# Patient Record
Sex: Male | Born: 1970
Health system: Southern US, Community
[De-identification: ages and names within clinical notes are randomized; demographics above are authoritative.]

## PROBLEM LIST (undated history)

## (undated) DIAGNOSIS — J449 Chronic obstructive pulmonary disease, unspecified: Secondary | ICD-10-CM

## (undated) DIAGNOSIS — R011 Cardiac murmur, unspecified: Secondary | ICD-10-CM

## (undated) DIAGNOSIS — R0600 Dyspnea, unspecified: Secondary | ICD-10-CM

## (undated) DIAGNOSIS — D649 Anemia, unspecified: Secondary | ICD-10-CM

## (undated) DIAGNOSIS — I10 Essential (primary) hypertension: Secondary | ICD-10-CM

## (undated) DIAGNOSIS — K219 Gastro-esophageal reflux disease without esophagitis: Secondary | ICD-10-CM

## (undated) DIAGNOSIS — C801 Malignant (primary) neoplasm, unspecified: Secondary | ICD-10-CM

## (undated) DIAGNOSIS — J45909 Unspecified asthma, uncomplicated: Secondary | ICD-10-CM

## (undated) HISTORY — DX: Chronic obstructive pulmonary disease, unspecified: J44.9

## (undated) HISTORY — DX: Essential (primary) hypertension: I10

---

## 2020-11-15 ENCOUNTER — Emergency Department (HOSPITAL_COMMUNITY): Payer: Self-pay

## 2020-11-15 ENCOUNTER — Inpatient Hospital Stay (HOSPITAL_COMMUNITY)
Admission: EM | Admit: 2020-11-15 | Discharge: 2020-12-15 | DRG: 853 | Disposition: A | Discharge status: Home / Self Care | Payer: Self-pay | Attending: Internal Medicine | Admitting: Internal Medicine

## 2020-11-15 ENCOUNTER — Other Ambulatory Visit: Payer: Self-pay

## 2020-11-15 ENCOUNTER — Encounter (HOSPITAL_COMMUNITY): Payer: Self-pay | Admitting: Emergency Medicine

## 2020-11-15 DIAGNOSIS — C19 Malignant neoplasm of rectosigmoid junction: Secondary | ICD-10-CM | POA: Diagnosis present

## 2020-11-15 DIAGNOSIS — Z8719 Personal history of other diseases of the digestive system: Secondary | ICD-10-CM

## 2020-11-15 DIAGNOSIS — E871 Hypo-osmolality and hyponatremia: Secondary | ICD-10-CM

## 2020-11-15 DIAGNOSIS — A419 Sepsis, unspecified organism: Principal | ICD-10-CM | POA: Diagnosis present

## 2020-11-15 DIAGNOSIS — Z6820 Body mass index (BMI) 20.0-20.9, adult: Secondary | ICD-10-CM

## 2020-11-15 DIAGNOSIS — F17210 Nicotine dependence, cigarettes, uncomplicated: Secondary | ICD-10-CM | POA: Diagnosis present

## 2020-11-15 DIAGNOSIS — D72825 Bandemia: Secondary | ICD-10-CM

## 2020-11-15 DIAGNOSIS — R739 Hyperglycemia, unspecified: Secondary | ICD-10-CM | POA: Diagnosis present

## 2020-11-15 DIAGNOSIS — U071 COVID-19: Secondary | ICD-10-CM

## 2020-11-15 DIAGNOSIS — R5383 Other fatigue: Secondary | ICD-10-CM

## 2020-11-15 DIAGNOSIS — K56600 Partial intestinal obstruction, unspecified as to cause: Secondary | ICD-10-CM | POA: Diagnosis present

## 2020-11-15 DIAGNOSIS — E8809 Other disorders of plasma-protein metabolism, not elsewhere classified: Secondary | ICD-10-CM | POA: Diagnosis present

## 2020-11-15 DIAGNOSIS — E440 Moderate protein-calorie malnutrition: Secondary | ICD-10-CM | POA: Insufficient documentation

## 2020-11-15 DIAGNOSIS — Z95828 Presence of other vascular implants and grafts: Secondary | ICD-10-CM

## 2020-11-15 DIAGNOSIS — K922 Gastrointestinal hemorrhage, unspecified: Secondary | ICD-10-CM | POA: Diagnosis present

## 2020-11-15 DIAGNOSIS — D75839 Thrombocytosis, unspecified: Secondary | ICD-10-CM

## 2020-11-15 DIAGNOSIS — C787 Secondary malignant neoplasm of liver and intrahepatic bile duct: Secondary | ICD-10-CM | POA: Diagnosis present

## 2020-11-15 DIAGNOSIS — R112 Nausea with vomiting, unspecified: Secondary | ICD-10-CM

## 2020-11-15 DIAGNOSIS — R160 Hepatomegaly, not elsewhere classified: Secondary | ICD-10-CM

## 2020-11-15 DIAGNOSIS — E43 Unspecified severe protein-calorie malnutrition: Secondary | ICD-10-CM

## 2020-11-15 DIAGNOSIS — K631 Perforation of intestine (nontraumatic): Secondary | ICD-10-CM

## 2020-11-15 DIAGNOSIS — Z800 Family history of malignant neoplasm of digestive organs: Secondary | ICD-10-CM

## 2020-11-15 DIAGNOSIS — Z4659 Encounter for fitting and adjustment of other gastrointestinal appliance and device: Secondary | ICD-10-CM

## 2020-11-15 DIAGNOSIS — K6819 Other retroperitoneal abscess: Secondary | ICD-10-CM

## 2020-11-15 DIAGNOSIS — K3532 Acute appendicitis with perforation and localized peritonitis, without abscess: Secondary | ICD-10-CM

## 2020-11-15 DIAGNOSIS — D500 Iron deficiency anemia secondary to blood loss (chronic): Secondary | ICD-10-CM | POA: Diagnosis present

## 2020-11-15 DIAGNOSIS — K567 Ileus, unspecified: Secondary | ICD-10-CM | POA: Diagnosis present

## 2020-11-15 DIAGNOSIS — D62 Acute posthemorrhagic anemia: Secondary | ICD-10-CM | POA: Diagnosis present

## 2020-11-15 DIAGNOSIS — D649 Anemia, unspecified: Secondary | ICD-10-CM

## 2020-11-15 DIAGNOSIS — Z0189 Encounter for other specified special examinations: Secondary | ICD-10-CM

## 2020-11-15 DIAGNOSIS — L0291 Cutaneous abscess, unspecified: Secondary | ICD-10-CM

## 2020-11-15 DIAGNOSIS — K6812 Psoas muscle abscess: Secondary | ICD-10-CM | POA: Diagnosis present

## 2020-11-15 DIAGNOSIS — R1031 Right lower quadrant pain: Secondary | ICD-10-CM

## 2020-11-15 DIAGNOSIS — Z720 Tobacco use: Secondary | ICD-10-CM

## 2020-11-15 DIAGNOSIS — T85698A Other mechanical complication of other specified internal prosthetic devices, implants and grafts, initial encounter: Secondary | ICD-10-CM

## 2020-11-15 DIAGNOSIS — K6389 Other specified diseases of intestine: Secondary | ICD-10-CM | POA: Diagnosis present

## 2020-11-15 DIAGNOSIS — Z9989 Dependence on other enabling machines and devices: Secondary | ICD-10-CM

## 2020-11-15 DIAGNOSIS — R1903 Right lower quadrant abdominal swelling, mass and lump: Secondary | ICD-10-CM

## 2020-11-15 DIAGNOSIS — C189 Malignant neoplasm of colon, unspecified: Secondary | ICD-10-CM

## 2020-11-15 HISTORY — DX: COVID-19: U07.1

## 2020-11-15 LAB — CBC WITH DIFFERENTIAL/PLATELET
Abs Immature Granulocytes: 0 10*3/uL (ref 0.00–0.07)
Basophils Absolute: 0 10*3/uL (ref 0.0–0.1)
Basophils Relative: 0 %
Eosinophils Absolute: 0 10*3/uL (ref 0.0–0.5)
Eosinophils Relative: 0 %
HCT: 19.2 % — ABNORMAL LOW (ref 39.0–52.0)
Hemoglobin: 5.4 g/dL — CL (ref 13.0–17.0)
Lymphocytes Relative: 4 %
Lymphs Abs: 0.9 10*3/uL (ref 0.7–4.0)
MCH: 17.5 pg — ABNORMAL LOW (ref 26.0–34.0)
MCHC: 28.1 g/dL — ABNORMAL LOW (ref 30.0–36.0)
MCV: 62.3 fL — ABNORMAL LOW (ref 80.0–100.0)
Monocytes Absolute: 0.2 10*3/uL (ref 0.1–1.0)
Monocytes Relative: 1 %
Neutro Abs: 20.3 10*3/uL — ABNORMAL HIGH (ref 1.7–7.7)
Neutrophils Relative %: 95 %
Platelets: 1165 10*3/uL (ref 150–400)
RBC: 3.08 MIL/uL — ABNORMAL LOW (ref 4.22–5.81)
RDW: 18.5 % — ABNORMAL HIGH (ref 11.5–15.5)
WBC: 21.4 10*3/uL — ABNORMAL HIGH (ref 4.0–10.5)
nRBC: 0 % (ref 0.0–0.2)
nRBC: 0 /100 WBC

## 2020-11-15 LAB — COMPREHENSIVE METABOLIC PANEL
ALT: 13 U/L (ref 0–44)
AST: 14 U/L — ABNORMAL LOW (ref 15–41)
Albumin: 1.9 g/dL — ABNORMAL LOW (ref 3.5–5.0)
Alkaline Phosphatase: 93 U/L (ref 38–126)
Anion gap: 10 (ref 5–15)
BUN: 13 mg/dL (ref 6–20)
CO2: 20 mmol/L — ABNORMAL LOW (ref 22–32)
Calcium: 8.2 mg/dL — ABNORMAL LOW (ref 8.9–10.3)
Chloride: 99 mmol/L (ref 98–111)
Creatinine, Ser: 0.79 mg/dL (ref 0.61–1.24)
GFR, Estimated: >60 mL/min (ref >60)
Glucose, Bld: 115 mg/dL — ABNORMAL HIGH (ref 70–99)
Potassium: 4.7 mmol/L (ref 3.5–5.1)
Sodium: 129 mmol/L — ABNORMAL LOW (ref 135–145)
Total Bilirubin: 0.2 mg/dL — ABNORMAL LOW (ref 0.3–1.2)
Total Protein: 6.6 g/dL (ref 6.5–8.1)

## 2020-11-15 LAB — VITAMIN B12: Vitamin B-12: 189 pg/mL (ref 180–914)

## 2020-11-15 LAB — POC OCCULT BLOOD, ED: Fecal Occult Bld: NEGATIVE

## 2020-11-15 LAB — FIBRINOGEN: Fibrinogen: 771 mg/dL — ABNORMAL HIGH (ref 210–475)

## 2020-11-15 LAB — TRIGLYCERIDES: Triglycerides: 91 mg/dL (ref <150)

## 2020-11-15 LAB — PREPARE RBC (CROSSMATCH)

## 2020-11-15 LAB — LACTATE DEHYDROGENASE: LDH: 98 U/L (ref 98–192)

## 2020-11-15 LAB — IRON AND TIBC
Iron: 8 ug/dL — ABNORMAL LOW (ref 45–182)
Saturation Ratios: 4 % — ABNORMAL LOW (ref 17.9–39.5)
TIBC: 228 ug/dL — ABNORMAL LOW (ref 250–450)
UIBC: 220 ug/dL

## 2020-11-15 LAB — RETICULOCYTES
Immature Retic Fract: 21.1 % — ABNORMAL HIGH (ref 2.3–15.9)
RBC.: 2.77 MIL/uL — ABNORMAL LOW (ref 4.22–5.81)
Retic Count, Absolute: 50.7 10*3/uL (ref 19.0–186.0)
Retic Ct Pct: 1.8 % (ref 0.4–3.1)

## 2020-11-15 LAB — PROTIME-INR
INR: 1.2 (ref 0.8–1.2)
Prothrombin Time: 14.5 seconds (ref 11.4–15.2)

## 2020-11-15 LAB — ABO/RH: ABO/RH(D): A POS

## 2020-11-15 LAB — D-DIMER, QUANTITATIVE: D-Dimer, Quant: 3.04 ug/mL-FEU — ABNORMAL HIGH (ref 0.00–0.50)

## 2020-11-15 LAB — FERRITIN: Ferritin: 64 ng/mL (ref 24–336)

## 2020-11-15 LAB — FOLATE: Folate: 9.1 ng/mL (ref >5.9)

## 2020-11-15 LAB — LACTIC ACID, PLASMA: Lactic Acid, Venous: 1.5 mmol/L (ref 0.5–1.9)

## 2020-11-15 LAB — SARS CORONAVIRUS 2 BY RT PCR (HOSPITAL ORDER, PERFORMED IN ~~LOC~~ HOSPITAL LAB): SARS Coronavirus 2: POSITIVE — AB

## 2020-11-15 LAB — PROCALCITONIN: Procalcitonin: 71.1 ng/mL

## 2020-11-15 LAB — C-REACTIVE PROTEIN: CRP: 21.5 mg/dL — ABNORMAL HIGH (ref <1.0)

## 2020-11-15 MED ORDER — IOHEXOL 350 MG/ML SOLN
80.0000 mL | Freq: Once | INTRAVENOUS | Status: AC | PRN
  Administered 2020-11-15: 80 mL via INTRAVENOUS

## 2020-11-15 MED ORDER — ONDANSETRON HCL 4 MG PO TABS
4.0000 mg | ORAL_TABLET | Freq: Four times a day (QID) | ORAL | Status: DC | PRN
  Administered 2020-11-25: 4 mg via ORAL
  Filled 2020-11-15: qty 1

## 2020-11-15 MED ORDER — SODIUM CHLORIDE 0.9 % IV SOLN
100.0000 mg | Freq: Every day | INTRAVENOUS | Status: AC
  Administered 2020-11-16 – 2020-11-17 (×2): 100 mg via INTRAVENOUS
  Filled 2020-11-15 (×2): qty 20

## 2020-11-15 MED ORDER — LACTATED RINGERS IV SOLN: INTRAVENOUS | Status: DC

## 2020-11-15 MED ORDER — SODIUM CHLORIDE 0.9 % IV SOLN
10.0000 mL/h | Freq: Once | INTRAVENOUS | Status: AC
  Administered 2020-11-15: 10 mL/h via INTRAVENOUS

## 2020-11-15 MED ORDER — FENTANYL CITRATE (PF) 100 MCG/2ML IJ SOLN: 50.0000 ug | INTRAMUSCULAR | Status: DC | PRN

## 2020-11-15 MED ORDER — ONDANSETRON HCL 4 MG/2ML IJ SOLN
4.0000 mg | Freq: Once | INTRAMUSCULAR | Status: AC
  Administered 2020-11-15: 4 mg via INTRAVENOUS
  Filled 2020-11-15: qty 2

## 2020-11-15 MED ORDER — LACTATED RINGERS IV BOLUS (SEPSIS)
500.0000 mL | Freq: Once | INTRAVENOUS | Status: AC
  Administered 2020-11-15: 500 mL via INTRAVENOUS

## 2020-11-15 MED ORDER — NICOTINE 21 MG/24HR TD PT24
21.0000 mg | MEDICATED_PATCH | Freq: Every day | TRANSDERMAL | Status: DC
  Administered 2020-11-16 – 2020-12-15 (×29): 21 mg via TRANSDERMAL
  Filled 2020-11-15 (×29): qty 1

## 2020-11-15 MED ORDER — PIPERACILLIN-TAZOBACTAM 3.375 G IVPB
3.3750 g | Freq: Three times a day (TID) | INTRAVENOUS | Status: AC
  Administered 2020-11-16 – 2020-12-03 (×54): 3.375 g via INTRAVENOUS
  Filled 2020-11-15 (×60): qty 50

## 2020-11-15 MED ORDER — SODIUM CHLORIDE 0.9 % IV SOLN
200.0000 mg | Freq: Once | INTRAVENOUS | Status: AC
  Administered 2020-11-15: 200 mg via INTRAVENOUS
  Filled 2020-11-15: qty 200

## 2020-11-15 MED ORDER — FENTANYL CITRATE (PF) 100 MCG/2ML IJ SOLN
50.0000 ug | Freq: Once | INTRAMUSCULAR | Status: AC
  Administered 2020-11-15: 50 ug via INTRAVENOUS
  Filled 2020-11-15: qty 2

## 2020-11-15 MED ORDER — SODIUM CHLORIDE 0.9 % IV BOLUS
500.0000 mL | Freq: Once | INTRAVENOUS | Status: AC
  Administered 2020-11-15: 500 mL via INTRAVENOUS

## 2020-11-15 MED ORDER — ONDANSETRON HCL 4 MG/2ML IJ SOLN
4.0000 mg | Freq: Four times a day (QID) | INTRAMUSCULAR | Status: DC | PRN
  Administered 2020-11-24 – 2020-11-30 (×5): 4 mg via INTRAVENOUS
  Filled 2020-11-15 (×5): qty 2

## 2020-11-15 MED ORDER — PIPERACILLIN-TAZOBACTAM 3.375 G IVPB 30 MIN
3.3750 g | Freq: Once | INTRAVENOUS | Status: AC
  Administered 2020-11-15: 3.375 g via INTRAVENOUS
  Filled 2020-11-15: qty 50

## 2020-11-15 MED ORDER — MORPHINE SULFATE (PF) 2 MG/ML IV SOLN
1.0000 mg | INTRAVENOUS | Status: DC | PRN
  Administered 2020-11-15 – 2020-11-18 (×16): 1 mg via INTRAVENOUS
  Filled 2020-11-15 (×16): qty 1

## 2020-11-15 MED ORDER — ACETAMINOPHEN 325 MG PO TABS
650.0000 mg | ORAL_TABLET | Freq: Once | ORAL | Status: AC
  Administered 2020-11-15: 650 mg via ORAL
  Filled 2020-11-15: qty 2

## 2020-11-15 MED ORDER — LACTATED RINGERS IV SOLN: INTRAVENOUS | Status: AC

## 2020-11-15 NOTE — ED Provider Notes (Incomplete)
[facility] EMERGENCY DEPARTMENT Provider Note   CSN: [id number] Arrival date & time: 11/15/20  1150     History Chief Complaint  Patient presents with  . Groin Pain    Eric [name] is a 50 y.o. male.  HPI     History reviewed. No pertinent past medical history.  There are no problems to display for this patient.   History reviewed. No pertinent surgical history.     No family history on file.  Social History   Tobacco Use  . Smoking status: Current Every Day Smoker  . Smokeless tobacco: Never Used  Substance Use Topics  . Alcohol use: Not Currently  . Drug use: Yes    Types: Marijuana    Home Medications Prior to Admission medications   Not on File    Allergies    Patient has no known allergies.  Review of Systems   Review of Systems  Physical Exam Updated Vital Signs BP 126/81 (BP Location: Right Arm)   Pulse (!) 127   Temp (!) 100.7 F (38.2 C) (Oral)   Resp (!) 24   SpO2 100%   Physical Exam  ED Results / Procedures / Treatments   Labs (all labs ordered are listed, but only abnormal results are displayed) Labs Reviewed  COMPREHENSIVE METABOLIC PANEL - Abnormal; Notable for the following components:      Result Value   Sodium 129 (*)    CO2 20 (*)    Glucose, Bld 115 (*)    Calcium 8.2 (*)    Albumin 1.9 (*)    AST 14 (*)    Total Bilirubin 0.2 (*)    All other components within normal limits  CBC WITH DIFFERENTIAL/PLATELET - Abnormal; Notable for the following components:   WBC 21.4 (*)    RBC 3.08 (*)    Hemoglobin 5.4 (*)    HCT 19.2 (*)    MCV 62.3 (*)    MCH 17.5 (*)    MCHC 28.1 (*)    RDW 18.5 (*)    All other components within normal limits  SARS CORONAVIRUS 2 BY RT PCR (HOSPITAL ORDER, [facility])  LACTIC ACID, PLASMA  LACTIC ACID, PLASMA  URINALYSIS, ROUTINE W REFLEX MICROSCOPIC  POC OCCULT BLOOD, ED  TYPE AND SCREEN    EKG EKG Interpretation  Date/Time:  Friday  November 15 2020 12:41:28 EST Ventricular Rate:  128 PR Interval:  116 QRS Duration: 72 QT Interval:  280 QTC Calculation: 408 R Axis:   57 Text Interpretation: *** Poor data quality, interpretation may be adversely affected Sinus tachycardia Otherwise normal ECG Confirmed by Dene Gentry [phone]) on 11/15/2020 1:29:19 PM   Radiology DG Chest Portable 1 View  Result Date: 11/15/2020 CLINICAL DATA:  COVID EXAM: PORTABLE CHEST 1 VIEW COMPARISON:  None. FINDINGS: Evaluation is limited secondary to patient rotation. The cardiomediastinal silhouette is normal in contour. Blunting of the RIGHT costophrenic angle. No pneumothorax. No acute pleuroparenchymal abnormality. Visualized abdomen is unremarkable. No acute osseous abnormality noted. IMPRESSION: Blunting of the RIGHT costophrenic angle may reflect a small pleural effusion. Electronically Signed   By: Valentino Saxon MD   On: 11/15/2020 13:27    Procedures Procedures {Remember to document critical care time when appropriate:1}  Medications Ordered in ED Medications  acetaminophen (TYLENOL) tablet 650 mg (650 mg Oral Given 11/15/20 1248)    ED Course  I have reviewed the triage vital signs and the nursing notes.  Pertinent labs &  imaging results that were available during my care of the patient were reviewed by me and considered in my medical decision making (see chart for details).    MDM Rules/Calculators/A&P                          *** Final Clinical Impression(s) / ED Diagnoses Final diagnoses:  None    Rx / DC Orders ED Discharge Orders    None

## 2020-11-15 NOTE — ED Provider Notes (Signed)
[facility] EMERGENCY DEPARTMENT Provider Note   CSN: [id number] Arrival date & time: 11/15/20  1150     History Chief Complaint  Patient presents with  . Groin Pain    Eric [name] is a 50 y.o. male.  Eric [name] is a 50 y.o. male with unknown past medical history, saw a new doctor for the first time in many years today for evaluation of right groin and abdominal pain.  He was sent to the emergency department for further evaluation.  Patient reports that on Monday he started noticing pain in his right lower abdomen and groin that sometimes radiates down into his leg with some tingling.  No weakness, but pain has made walking difficult and he has been using a cane.  Patient also reports that he has been feeling a bit weak and fatigued.  His PCP was concerned given this pain and felt that he needed to come to the emergency department.  Patient reports they did check his stool for blood in the office today and reports that this was negative.  Patient has not seen any bright red blood, or black stools.  Denies any dysuria or urinary frequency.  Has not seen any blood in his urine.  Was also noted to be febrile at the doctor's office, unknown if he has been having fevers at home.  Reports he has been feeling a bit unwell, denies chest pain or shortness of breath but has had an occasional cough.  He has had 2 doses of COVID vaccine, has not had a booster.  Reports some coworkers at work have had a COVID recently.  Denies alcohol use.  Does report current smoking.        History reviewed. No pertinent past medical history.  There are no problems to display for this patient.   History reviewed. No pertinent surgical history.     No family history on file.  Social History   Tobacco Use  . Smoking status: Current Every Day Smoker  . Smokeless tobacco: Never Used  Substance Use Topics  . Alcohol use: Not Currently  . Drug use: Yes    Types: Marijuana    Home  Medications Prior to Admission medications   Not on File    Allergies    Patient has no known allergies.  Review of Systems   Review of Systems  Constitutional: Positive for chills and fever.  HENT: Negative for congestion, rhinorrhea and sore throat.   Respiratory: Positive for cough. Negative for shortness of breath.   Cardiovascular: Negative for chest pain and leg swelling.  Gastrointestinal: Positive for abdominal pain. Negative for blood in stool, constipation, diarrhea, nausea and vomiting.  Genitourinary: Negative for dysuria, flank pain, frequency, hematuria, penile swelling, scrotal swelling and testicular pain.  Musculoskeletal: Positive for myalgias. Negative for arthralgias and back pain.  Skin: Negative for color change and rash.  Neurological: Negative for dizziness, syncope and light-headedness.  All other systems reviewed and are negative.   Physical Exam Updated Vital Signs BP 113/75   Pulse 93   Temp (!) 100.7 F (38.2 C) (Oral)   Resp 19   SpO2 98%   Physical Exam Vitals and nursing note reviewed.  Constitutional:      General: He is not in acute distress.    Appearance: He is well-developed and well-nourished. He is ill-appearing. He is not diaphoretic.     Comments: Alert, appears much older than stated age, pale and ill-appearing but in no acute  distress  HENT:     Head: Normocephalic and atraumatic.     Mouth/Throat:     Mouth: Oropharynx is clear and moist. Mucous membranes are dry.     Pharynx: Oropharynx is clear.  Eyes:     General:        Right eye: No discharge.        Left eye: No discharge.     Extraocular Movements: EOM normal.     Comments: Conjunctivae pale  Cardiovascular:     Rate and Rhythm: Regular rhythm. Tachycardia present.     Pulses: Intact distal pulses.     Heart sounds: Normal heart sounds. No murmur heard. No friction rub. No gallop.   Pulmonary:     Effort: Pulmonary effort is normal. No respiratory distress.      Breath sounds: Normal breath sounds. No wheezing or rales.     Comments: Respirations equal and unlabored, patient able to speak in full sentences, lungs clear to auscultation bilaterally  Abdominal:     General: Bowel sounds are normal. There is no distension.     Palpations: Abdomen is soft. There is no mass.     Tenderness: There is abdominal tenderness. There is no guarding.     Comments: Abdomen is soft, nondistended, bowel sounds present throughout, there is focal tenderness noted in the right lower quadrant with slight guarding, all other quadrants nontender to palpation.  Genitourinary:    Comments: Patient testicles appear normal, no masses or swelling noted, no evidence of hernia. Patient had negative Hemoccult at PCP today, so repeat rectal exam deferred Musculoskeletal:        General: Tenderness present. No deformity or edema.     Cervical back: Neck supple.     Comments: Some tenderness noted in the right upper thigh, primarily with movement, no palpable swelling or deformity no overlying skin changes.  Skin:    General: Skin is warm and dry.     Capillary Refill: Capillary refill takes less than 2 seconds.  Neurological:     Mental Status: He is alert.     Coordination: Coordination normal.     Comments: Speech is clear, able to follow commands CN III-XII intact Normal strength in upper and lower extremities bilaterally including dorsiflexion and plantar flexion, strong and equal grip strength Sensation normal to light and sharp touch Moves extremities without ataxia, coordination intact  Psychiatric:        Mood and Affect: Mood normal.        Behavior: Behavior normal.     ED Results / Procedures / Treatments   Labs (all labs ordered are listed, but only abnormal results are displayed) Labs Reviewed  SARS CORONAVIRUS 2 BY RT PCR (HOSPITAL ORDER, [facility]) - Abnormal; Notable for the following components:      Result Value   SARS  Coronavirus 2 POSITIVE (*)    All other components within normal limits  COMPREHENSIVE METABOLIC PANEL - Abnormal; Notable for the following components:   Sodium 129 (*)    CO2 20 (*)    Glucose, Bld 115 (*)    Calcium 8.2 (*)    Albumin 1.9 (*)    AST 14 (*)    Total Bilirubin 0.2 (*)    All other components within normal limits  CBC WITH DIFFERENTIAL/PLATELET - Abnormal; Notable for the following components:   WBC 21.4 (*)    RBC 3.08 (*)    Hemoglobin 5.4 (*)    HCT  19.2 (*)    MCV 62.3 (*)    MCH 17.5 (*)    MCHC 28.1 (*)    RDW 18.5 (*)    Platelets 1,165 (*)    Neutro Abs 20.3 (*)    All other components within normal limits  D-DIMER, QUANTITATIVE (NOT AT [facility]) - Abnormal; Notable for the following components:   D-Dimer, Quant 3.04 (*)    All other components within normal limits  FIBRINOGEN - Abnormal; Notable for the following components:   Fibrinogen 771 (*)    All other components within normal limits  C-REACTIVE PROTEIN - Abnormal; Notable for the following components:   CRP 21.5 (*)    All other components within normal limits  CULTURE, BLOOD (ROUTINE X 2)  CULTURE, BLOOD (ROUTINE X 2)  LACTIC ACID, PLASMA  LACTATE DEHYDROGENASE  FERRITIN  TRIGLYCERIDES  URINALYSIS, ROUTINE W REFLEX MICROSCOPIC  PATHOLOGIST SMEAR REVIEW  PROCALCITONIN  VITAMIN B12  FOLATE  IRON AND TIBC  RETICULOCYTES  PROTIME-INR  POC OCCULT BLOOD, ED  TYPE AND SCREEN  PREPARE RBC (CROSSMATCH)  ABO/RH    EKG EKG Interpretation  Date/Time:  Friday November 15 2020 12:41:28 EST Ventricular Rate:  128 PR Interval:  116 QRS Duration: 72 QT Interval:  280 QTC Calculation: 408 R Axis:   57 Text Interpretation:  Poor data quality, interpretation may be adversely affected Sinus tachycardia Otherwise normal ECG Confirmed by Dene Gentry [phone]) on 11/15/2020 1:29:19 PM   Radiology DG Chest Portable 1 View  Result Date: 11/15/2020 CLINICAL DATA:  COVID EXAM: PORTABLE CHEST 1 VIEW  COMPARISON:  None. FINDINGS: Evaluation is limited secondary to patient rotation. The cardiomediastinal silhouette is normal in contour. Blunting of the RIGHT costophrenic angle. No pneumothorax. No acute pleuroparenchymal abnormality. Visualized abdomen is unremarkable. No acute osseous abnormality noted. IMPRESSION: Blunting of the RIGHT costophrenic angle may reflect a small pleural effusion. Electronically Signed   By: Valentino Saxon MD   On: 11/15/2020 13:27    Procedures .Critical Care Performed by: Jacqlyn Larsen, PA-C Authorized by: Jacqlyn Larsen, PA-C   Critical care provider statement:    Critical care time (minutes):  45   Critical care was necessary to treat or prevent imminent or life-threatening deterioration of the following conditions:  Circulatory failure (Anemia requiring transfusion)   Critical care was time spent personally by me on the following activities:  Discussions with consultants, evaluation of patient's response to treatment, examination of patient, ordering and performing treatments and interventions, ordering and review of laboratory studies, ordering and review of radiographic studies, pulse oximetry, re-evaluation of patient's condition, obtaining history from patient or surrogate, review of old charts and development of treatment plan with patient or surrogate     Medications Ordered in ED Medications  0.9 %  sodium chloride infusion (has no administration in time range)  acetaminophen (TYLENOL) tablet 650 mg (650 mg Oral Given 11/15/20 1248)  fentaNYL (SUBLIMAZE) injection 50 mcg (50 mcg Intravenous Given 11/15/20 1423)  ondansetron (ZOFRAN) injection 4 mg (4 mg Intravenous Given 11/15/20 1424)  sodium chloride 0.9 % bolus 500 mL (500 mLs Intravenous New Bag/Given 11/15/20 1425)    ED Course  I have reviewed the triage vital signs and the nursing notes.  Pertinent labs & imaging results that were available during my care of the patient were reviewed by me  and considered in my medical decision making (see chart for details).    MDM Rules/Calculators/A&P  50 year old male arrives of right lower quadrant and right groin pain, but found to have hemoglobin of 5.4 on lab work completed in triage as well as leukocytosis of 21.4, thrombocytosis, and patient found to be febrile and tachycardic.  He is normotensive.  Patient appears pale and somewhat ill but it is alert and mentating well and not in acute distress.  He reports an occasional cough but no chest pain or shortness of breath.  Pain in the right lower quadrant and groin has been present for a week.  With movement pain radiates into his leg and is associated with tingling.  No vomiting, normal bowel movements.  While in the room evaluating the patient his Covid test also returned positive which could explain his cough and fever, but given pain and significant blood dyscrasias we will also get CT abdomen pelvis, will also check COVID inflammatory markers.  2 unit PRBC transfusion ordered and patient will certainly require admission for further evaluation of cause of this anemia as he has not had any bleeding symptoms and had negative Hemoccult at PCP office prior to arrival.  Additional history obtained from patient's sister who was initially at bedside prior to arrival.  No prior medical charts available for review.  I have independently ordered, reviewed and interpreted all labs and imaging: CBC: Leukocytosis of 21.4 with left shift and greater than 20% bands, microcytic anemia of 5.4 of unknown chronicity, significant thrombocytosis with platelets of 1165.  CBC findings concerning for potential myeloproliferative disorder. CMP: Hyponatremia of 129, CO2 of 20, glucose 115, normal renal function and liver function  Covid: Positive   Chest x-ray with some blunting of the right costophrenic angle that could be due to a small pleural effusion, but otherwise no other active  cardiopulmonary disease.  EKG with sinus tachycardia.  Patient is critically ill with symptomatic anemia of unknown etiology, 2 units PRBC transfusion ordered as well as IV fluids, pain and nausea medication.  Will get CT abdomen pelvis to further evaluate right lower quadrant pain radiating into the thigh.  Patient is currently neurologically intact in bilateral lower extremities.  D-dimer significantly elevated with COVID inflammatory markers will also check CTA abdomen pelvis. COVID respiratory disease is mild at this time.  At shift change care signed out to PA Wyn Quaker who will follow up on pending studies and admit patient.  Final Clinical Impression(s) / ED Diagnoses Final diagnoses:  Symptomatic anemia  Right lower quadrant pain    Rx / DC Orders ED Discharge Orders    None       Janet Berlin 11/15/20 [place], DO 11/18/20 [phone]

## 2020-11-15 NOTE — H&P (Signed)
History and Physical    Eric [name] EHM:[id number] DOB: 17-Nov-1970 DOA: 11/15/2020  PCP: [facility]  Patient coming from: Home  I have personally briefly reviewed patient's old medical records in [place]  Chief Complaint: Right groin pain  HPI: Eric [name] is a 50 y.o. male with medical history significant for tobacco use who presents to the ED for evaluation of right groin pain.  Patient states over the last 5 days he has had significant right groin pain which radiates down his right thigh.  Pain has been progressive since onset and constant.  He reports having chills and night sweats.  He has had about 15 pound unintentional weight loss over the last 3 months.  He denies any subjective fevers.  He denies any chest pain or dyspnea.  He has a new mild nonproductive cough.  He denies any nausea, vomiting, dysuria, diarrhea, or constipation.  He has not had any routine medical care for at least 10 years.  He is a chronic smoker of up to 1.5 packs/day since age of 67.  He reports rare alcohol use with last drink 3 months ago.  He uses marijuana on occasion and denies any cocaine or IV drug use.  He does not take any medications other than over-the-counter Tylenol as needed.  He reports a history of diabetes in his sister.  He works as a Games developer.  ED Course:  Initial vitals showed BP 126/81, pulse 127, RR 24, temp 100.7 F, SPO2 100% on room air.  Labs significant for WBC 21.4, hemoglobin 5.4, hematocrit 19.2, MCV 62.3, RDW 18.5, platelets 1165k, sodium 129, potassium 4.7, bicarb 20, BUN 13, creatinine 0.79, AST 14, ALT 13, alk phos 93, total bilirubin 0.2, D-dimer 3.04, procalcitonin 71.10, CRP 21.5, ferritin 64, iron 8, TIBC 228, saturation 4%, folate 9.1, B12 189.  FOBT negative.  Portable chest x-ray showed blunting of the right costophrenic angle.  CTA chest and CT abdomen/pelvis obtained which showed long segment masslike mural thickening of the cecum  concerning for neoplasm.  Posterior perforation at the site of cecal wall thickening seen with multilocular right retroperitoneal abscess which extends into the right psoas and right iliacus muscles.  Indeterminate hypodensity inferior right lobe of the liver seen.  No evidence of PE.  Mild wall thickening of the distal thoracic esophagus noted.  Patient was given 500 cc normal saline, 500 cc LR, IV Zofran, IV fentanyl, IV Zosyn.  General surgery were consulted and recommended medical admission.  No surgery planned at time of admission.  The hospitalist service was consulted to me for further evaluation and management.  Review of Systems: All systems reviewed and are negative except as documented in history of present illness above.   History reviewed. No pertinent past medical history.  History reviewed. No pertinent surgical history.  Social History:  reports that he has been smoking. He has never used smokeless tobacco. He reports previous alcohol use. He reports current drug use. Drug: Marijuana.  No Known Allergies  Family History  Problem Relation Age of Onset  . Diabetes Sister      Prior to Admission medications   Medication Sig Start Date End Date Taking? Authorizing Provider  acetaminophen (TYLENOL) 500 MG tablet Take 500 mg by mouth every 6 (six) hours as needed.   Yes [provider]  Calcium Carbonate Antacid (ALKA-SELTZER ANTACID PO) Take 1 tablet by mouth daily as needed (For pain).   Yes [provider]  famotidine (PEPCID)  20 MG tablet Take 20 mg by mouth 2 (two) times daily.   Yes [provider]    Physical Exam: Vitals:   11/15/20 1845 11/15/20 1900 11/15/20 1915 11/15/20 2000  BP: 120/79 115/76 121/79 118/80  Pulse:  95 95 96  Resp: 19 (!) 25 (!) 23 (!) 23  Temp:    99.1 F (37.3 C)  TempSrc:    Oral  SpO2:  100% 98% 100%  Weight:    69.4 kg  Height:    6' (1.829 m)   Constitutional: Resting supine in bed, NAD, calm,  comfortable Eyes: PERRL, lids and conjunctivae normal ENMT: Mucous membranes are moist. Posterior pharynx clear of any exudate or lesions.Normal dentition.  Neck: normal, supple, no masses. Respiratory: clear to auscultation bilaterally, no wheezing, no crackles. Normal respiratory effort. No accessory muscle use.  Cardiovascular: Regular rate and rhythm, no murmurs / rubs / gallops. No extremity edema. 2+ pedal pulses. Abdomen: RLQ tenderness. Bowel sounds positive.  Musculoskeletal: no clubbing / cyanosis. No joint deformity upper and lower extremities. Good ROM, no contractures. Normal muscle tone.  Skin: no rashes, lesions, ulcers. No induration Neurologic: CN 2-12 grossly intact. Sensation intact. Strength 5/5 in all 4.  Psychiatric: Normal judgment and insight. Alert and oriented x 3. Normal mood.   Labs on Admission: I have personally reviewed following labs and imaging studies  CBC: Recent Labs  Lab 11/15/20 1246  WBC 21.4*  NEUTROABS 20.3*  HGB 5.4*  HCT 19.2*  MCV 62.3*  PLT 2,671*   Basic Metabolic Panel: Recent Labs  Lab 11/15/20 1246  NA 129*  K 4.7  CL 99  CO2 20*  GLUCOSE 115*  BUN 13  CREATININE 0.79  CALCIUM 8.2*   GFR: Estimated Creatinine Clearance: 107.2 mL/min (by C-G formula based on SCr of 0.79 mg/dL). Liver Function Tests: Recent Labs  Lab 11/15/20 1246  AST 14*  ALT 13  ALKPHOS 93  BILITOT 0.2*  PROT 6.6  ALBUMIN 1.9*   No results for input(s): LIPASE, AMYLASE in the last 168 hours. No results for input(s): AMMONIA in the last 168 hours. Coagulation Profile: Recent Labs  Lab 11/15/20 1520  INR 1.2   Cardiac Enzymes: No results for input(s): CKTOTAL, CKMB, CKMBINDEX, TROPONINI in the last 168 hours. BNP (last 3 results) No results for input(s): PROBNP in the last 8760 hours. HbA1C: No results for input(s): HGBA1C in the last 72 hours. CBG: No results for input(s): GLUCAP in the last 168 hours. Lipid Profile: Recent Labs     11/15/20 1429  TRIG 91   Thyroid Function Tests: No results for input(s): TSH, T4TOTAL, FREET4, T3FREE, THYROIDAB in the last 72 hours. Anemia Panel: Recent Labs    11/15/20 1429 11/15/20 1520  VITAMINB12  --  189  FOLATE  --  9.1  FERRITIN 64  --   TIBC  --  228*  IRON  --  8*  RETICCTPCT  --  1.8   Urine analysis: No results found for: COLORURINE, APPEARANCEUR, LABSPEC, PHURINE, GLUCOSEU, HGBUR, BILIRUBINUR, KETONESUR, PROTEINUR, UROBILINOGEN, NITRITE, LEUKOCYTESUR  Radiological Exams on Admission: CT Angio Chest PE W and/or Wo Contrast  Result Date: 11/15/2020 CLINICAL DATA:  Positive D-dimer, COVID-19 positive, tobacco abuse, right lower quadrant abdominal pain EXAM: CT ANGIOGRAPHY CHEST CT ABDOMEN AND PELVIS WITH CONTRAST TECHNIQUE: Multidetector CT imaging of the chest was performed using the standard protocol during bolus administration of intravenous contrast. Multiplanar CT image reconstructions and MIPs were obtained to evaluate the vascular anatomy. Multidetector  CT imaging of the abdomen and pelvis was performed using the standard protocol during bolus administration of intravenous contrast. CONTRAST:  26m OMNIPAQUE IOHEXOL 350 MG/ML SOLN COMPARISON:  11/15/2020 FINDINGS: CTA CHEST FINDINGS Cardiovascular: This is a technically adequate evaluation of the pulmonary vasculature. No filling defects or pulmonary emboli. The heart is unremarkable without pericardial effusion. Mild atherosclerosis within the LAD distribution of the coronary vasculature. Normal caliber of the thoracic aorta without evidence of dissection. Mediastinum/Nodes: Mild wall thickening of the distal thoracic esophagus is nonspecific and could reflect esophagitis. Thyroid and trachea are unremarkable. No pathologic adenopathy. Lungs/Pleura: No acute airspace disease, effusion, or pneumothorax. Mild upper lobe predominant emphysema with areas of subpleural scarring. Central airways are patent. Musculoskeletal: No  acute or destructive bony lesions. Reconstructed images demonstrate no additional findings. Review of the MIP images confirms the above findings. CT ABDOMEN and PELVIS FINDINGS Hepatobiliary: There is an indeterminate hypodensity within the inferior aspect right lobe liver, measuring approximately 3.5 x 2.5 cm reference image 36/4. No other focal liver abnormalities. The gallbladder is unremarkable. No intrahepatic duct dilation. Pancreas: Unremarkable. No pancreatic ductal dilatation or surrounding inflammatory changes. Spleen: Normal in size without focal abnormality. Adrenals/Urinary Tract: Adrenal glands are unremarkable. Kidneys are normal, without renal calculi, focal lesion, or hydronephrosis. Bladder is unremarkable. Stomach/Bowel: There is masslike wall thickening involving the cecum, extending approximately 9 cm in length. There is evidence of cecal perforation posteriorly, with a complex multiloculated retroperitoneal fluid collection. There are intramuscular components of the fluid collection within the right psoas and iliacus muscles, with a fluid collection extending into the proximal aspect of the iliopsoas tendon. This collection measures approximately 9.1 x 6.7 cm in transverse dimension, and extends approximately 18 cm in craniocaudal length. Differential diagnosis includes neoplasm or severe colitis. A normal gas-filled appendix is seen in the right lower quadrant. Vascular/Lymphatic: No discrete adenopathy within the abdomen or pelvis. Moderate atherosclerosis of the aorta. Reproductive: Prostate is unremarkable. Other: There is no free intraperitoneal fluid or free gas. No abdominal wall hernia. Musculoskeletal: No acute or destructive bony lesions. Reconstructed images demonstrate no additional findings. Review of the MIP images confirms the above findings. IMPRESSION: 1. Long segment masslike mural thickening of the cecum, concerning for neoplasm. 2. Posterior perforation at the site of cecal  wall thickening, with multilocular right retroperitoneal abscess which extends into the right psoas and right iliacus muscles as above. 3. Indeterminate hypodensity inferior right lobe liver. Dedicated nonemergent outpatient liver MRI could be performed for definitive characterization. 4. No evidence of pulmonary embolus. 5. Mild wall thickening of the distal thoracic esophagus, which could reflect mild esophagitis. 6. Aortic Atherosclerosis (ICD10-I70.0) and Emphysema (ICD10-J43.9). Critical Value/emergent results were called by telephone at the time of interpretation on 11/15/2020 at 4:16 pm to provider [place] who verbally acknowledged these results. Electronically Signed   By: MRanda NgoM.D.   On: 11/15/2020 16:22   CT ABDOMEN PELVIS W CONTRAST  Result Date: 11/15/2020 CLINICAL DATA:  Positive D-dimer, COVID-19 positive, tobacco abuse, right lower quadrant abdominal pain EXAM: CT ANGIOGRAPHY CHEST CT ABDOMEN AND PELVIS WITH CONTRAST TECHNIQUE: Multidetector CT imaging of the chest was performed using the standard protocol during bolus administration of intravenous contrast. Multiplanar CT image reconstructions and MIPs were obtained to evaluate the vascular anatomy. Multidetector CT imaging of the abdomen and pelvis was performed using the standard protocol during bolus administration of intravenous contrast. CONTRAST:  825mOMNIPAQUE IOHEXOL 350 MG/ML SOLN COMPARISON:  11/15/2020 FINDINGS: CTA CHEST FINDINGS Cardiovascular: This  is a technically adequate evaluation of the pulmonary vasculature. No filling defects or pulmonary emboli. The heart is unremarkable without pericardial effusion. Mild atherosclerosis within the LAD distribution of the coronary vasculature. Normal caliber of the thoracic aorta without evidence of dissection. Mediastinum/Nodes: Mild wall thickening of the distal thoracic esophagus is nonspecific and could reflect esophagitis. Thyroid and trachea are unremarkable. No  pathologic adenopathy. Lungs/Pleura: No acute airspace disease, effusion, or pneumothorax. Mild upper lobe predominant emphysema with areas of subpleural scarring. Central airways are patent. Musculoskeletal: No acute or destructive bony lesions. Reconstructed images demonstrate no additional findings. Review of the MIP images confirms the above findings. CT ABDOMEN and PELVIS FINDINGS Hepatobiliary: There is an indeterminate hypodensity within the inferior aspect right lobe liver, measuring approximately 3.5 x 2.5 cm reference image 36/4. No other focal liver abnormalities. The gallbladder is unremarkable. No intrahepatic duct dilation. Pancreas: Unremarkable. No pancreatic ductal dilatation or surrounding inflammatory changes. Spleen: Normal in size without focal abnormality. Adrenals/Urinary Tract: Adrenal glands are unremarkable. Kidneys are normal, without renal calculi, focal lesion, or hydronephrosis. Bladder is unremarkable. Stomach/Bowel: There is masslike wall thickening involving the cecum, extending approximately 9 cm in length. There is evidence of cecal perforation posteriorly, with a complex multiloculated retroperitoneal fluid collection. There are intramuscular components of the fluid collection within the right psoas and iliacus muscles, with a fluid collection extending into the proximal aspect of the iliopsoas tendon. This collection measures approximately 9.1 x 6.7 cm in transverse dimension, and extends approximately 18 cm in craniocaudal length. Differential diagnosis includes neoplasm or severe colitis. A normal gas-filled appendix is seen in the right lower quadrant. Vascular/Lymphatic: No discrete adenopathy within the abdomen or pelvis. Moderate atherosclerosis of the aorta. Reproductive: Prostate is unremarkable. Other: There is no free intraperitoneal fluid or free gas. No abdominal wall hernia. Musculoskeletal: No acute or destructive bony lesions. Reconstructed images demonstrate no  additional findings. Review of the MIP images confirms the above findings. IMPRESSION: 1. Long segment masslike mural thickening of the cecum, concerning for neoplasm. 2. Posterior perforation at the site of cecal wall thickening, with multilocular right retroperitoneal abscess which extends into the right psoas and right iliacus muscles as above. 3. Indeterminate hypodensity inferior right lobe liver. Dedicated nonemergent outpatient liver MRI could be performed for definitive characterization. 4. No evidence of pulmonary embolus. 5. Mild wall thickening of the distal thoracic esophagus, which could reflect mild esophagitis. 6. Aortic Atherosclerosis (ICD10-I70.0) and Emphysema (ICD10-J43.9). Critical Value/emergent results were called by telephone at the time of interpretation on 11/15/2020 at 4:16 pm to provider [place], who verbally acknowledged these results. Electronically Signed   By: Randa Ngo M.D.   On: 11/15/2020 16:22   DG Chest Portable 1 View  Result Date: 11/15/2020 CLINICAL DATA:  COVID EXAM: PORTABLE CHEST 1 VIEW COMPARISON:  None. FINDINGS: Evaluation is limited secondary to patient rotation. The cardiomediastinal silhouette is normal in contour. Blunting of the RIGHT costophrenic angle. No pneumothorax. No acute pleuroparenchymal abnormality. Visualized abdomen is unremarkable. No acute osseous abnormality noted. IMPRESSION: Blunting of the RIGHT costophrenic angle may reflect a small pleural effusion. Electronically Signed   By: Valentino Saxon MD   On: 11/15/2020 13:27    EKG: Personally reviewed. Sinus tachycardia, rate 128, no acute ischemic changes.  No prior for comparison.  Assessment/Plan Principal Problem:   Sepsis ([place]) Active Problems:   Colonic mass   Iron deficiency anemia due to chronic blood loss   COVID-19 virus infection   Thrombocytosis   Retroperitoneal  abscess ([place])   Hyponatremia  Eric [name] is a 50 y.o. male with medical history significant for  tobacco use who is admitted with sepsis due to right retroperitoneal abscess.  Sepsis due to right retroperitoneal abscess associated with cecal mass and perforation: Present on arrival with leukocytosis, tachycardia, tachypnea, and fever. -General surgery following -Continue IV Zosyn per pharmacy -Follow blood and urine cultures -Continue IV fluid resuscitation overnight -IV morphine as needed for severe pain -Keep n.p.o.  Severe iron deficiency anemia: Hemoglobin 5.4 on admission.  Transfusing 2 unit PRBCs.  Suspect chronic blood loss from cecal mass.  B12 is also low. -Transfusing 2 unit PRBCs -Follow-up posttransfusion H&H  Thrombocytosis: Suspect reactive thrombocytosis due to anemia and ongoing infection.  Continue to monitor.  Hyponatremia: Mild.  Continue IV fluids and follow labs.  COVID-19 viral infection: SARS-CoV-2 PCR + 11/15/2020.  Has mild nonproductive cough otherwise no significant Covid symptoms.  He has received 2 doses of the Pfizer Covid vaccine but has not yet received the booster dose.  He had positive contacts at work.  CT of the lungs without evidence of airspace disease or PE. -Continue precautions -Start IV remdesivir x3-day course  Liver lesion: Indeterminate hypodensity inferior right lower lobe of the liver seen on CT imaging.  Consider nonemergent liver MRI for definitive characterization.  Tobacco use: Advised on smoking cessation.  Nicotine patch provided.  DVT prophylaxis: SCDs Code Status: Full code, confirmed with patient Family Communication: Discussed with patient, he has discussed with family Disposition Plan: From home, dispo pending further management of sepsis, retroperitoneal abscess Consults called: General surgery Level of care: Telemetry Surgical Admission status:  Status is: Inpatient  Remains inpatient appropriate because:IV treatments appropriate due to intensity of illness or inability to take PO and Inpatient level of care  appropriate due to severity of illness   Dispo: The patient is from: Home              Anticipated d/c is to: Home              Anticipated d/c date is: 3 days              Patient currently is not medically stable to d/c.   Difficult to place patient No   Zada Finders MD [facility]  If 7PM-7AM, please contact night-coverage [email or web address]  11/15/2020, 9:39 PM

## 2020-11-15 NOTE — Consult Note (Signed)
Reason for Consult: Perforated colon mass Referring Physician: Dr. Judithann Sheen is an 50 y.o. [name].  HPI: Patient is a Eric [name], who comes in secondary to right lower quadrant and hip pain.  Patient states he had not been to the doctor in the last 10 years.  Patient had pain and presented to an outside physician's office.  Patient was sent to the ER for evaluation.  Patient states that he had pain approximately starting Monday.  He states this was right lower quadrant and right hip pain.  He states that it has been continuing since that time.  He states prior to his had no issues.  Patient states he been having normal bowel movements without any blood, and not black.  Patient states the caliber has been normal.  Patient states he had a normal appetite.  Patient denies any previous colonoscopy or endoscopy.  Patient states that he has had no family history of any cancer.  CT scan that I reviewed personally reveals large right-sided colon mass, retroperitoneal perforation with abscess likely over the psoas and down into the right iliopsoas.  Patient also with iron deficiency anemia, hemoglobin 5.3  Patient also Covid +  History reviewed. No pertinent past medical history.  History reviewed. No pertinent surgical history.  No family history on file.  Social History:  reports that he has been smoking. He has never used smokeless tobacco. He reports previous alcohol use. He reports current drug use. Drug: Marijuana.  Allergies: No Known Allergies  Medications: I have reviewed the patient's current medications.  Results for orders placed or performed during the hospital encounter of 11/15/20 (from the past 48 hour(s))  SARS Coronavirus 2 by RT PCR (hospital order, performed in [facility] hospital lab) Nasopharyngeal Nasopharyngeal Swab     Status: Abnormal   Collection Time: 11/15/20 12:41 PM   Specimen: Nasopharyngeal Swab  Result Value Ref Range   SARS Coronavirus 2  POSITIVE (A) NEGATIVE    Comment: RESULT CALLED TO, READ BACK BY AND VERIFIED WITH: E SHAFFER RN 1352 11/15/20 A BROWNING (NOTE) SARS-CoV-2 target nucleic acids are DETECTED  SARS-CoV-2 RNA is generally detectable in upper respiratory specimens  during the acute phase of infection.  Positive results are indicative  of the presence of the identified virus, but do not rule out bacterial infection or co-infection with other pathogens not detected by the test.  Clinical correlation with patient history and  other diagnostic information is necessary to determine patient infection status.  The expected result is negative.  Fact Sheet for Patients:   BoilerBrush.com.cy   Fact Sheet for Healthcare Providers:   https://pope.com/    This test is not yet approved or cleared by the [place] FDA and  has been authorized for detection and/or diagnosis of SARS-CoV-2 by FDA under an Emergency Use Authorization (EUA).  This EUA will remain in effect (meaning this t est can be used) for the duration of  the COVID-19 declaration under Section 564(b)(1) of the Act, 21 U.S.C. section 360-bbb-3(b)(1), unless the authorization is terminated or revoked sooner.  Performed at [facility], [address], [place], [place] [address]   Lactic acid, plasma     Status: None   Collection Time: 11/15/20 12:46 PM  Result Value Ref Range   Lactic Acid, Venous 1.5 0.5 - 1.9 mmol/L    Comment: Performed at [facility], [address], [place], [place] [address]  Comprehensive metabolic panel  Status: Abnormal   Collection Time: 11/15/20 12:46 PM  Result Value Ref Range   Sodium 129 (L) 135 - 145 mmol/L   Potassium 4.7 3.5 - 5.1 mmol/L   Chloride 99 98 - 111 mmol/L   CO2 20 (L) 22 - 32 mmol/L   Glucose, Bld 115 (H) 70 - 99 mg/dL    Comment: Glucose reference range applies only to samples taken after fasting for at least 8 hours.   BUN 13 6 -  20 mg/dL   Creatinine, Ser 0.79 0.61 - 1.24 mg/dL   Calcium 8.2 (L) 8.9 - 10.3 mg/dL   Total Protein 6.6 6.5 - 8.1 g/dL   Albumin 1.9 (L) 3.5 - 5.0 g/dL   AST 14 (L) 15 - 41 U/L   ALT 13 0 - 44 U/L   Alkaline Phosphatase 93 38 - 126 U/L   Total Bilirubin 0.2 (L) 0.3 - 1.2 mg/dL   GFR, Estimated >60 >60 mL/min    Comment: (NOTE) Calculated using the CKD-EPI Creatinine Equation (2021)    Anion gap 10 5 - 15    Comment: Performed at [facility], [place] [address] [place], [place] [address]  CBC with Differential     Status: Abnormal   Collection Time: 11/15/20 12:46 PM  Result Value Ref Range   WBC 21.4 (H) 4.0 - 10.5 K/uL   RBC 3.08 (L) 4.22 - 5.81 MIL/uL   Hemoglobin 5.4 (LL) 13.0 - 17.0 g/dL    Comment: REPEATED TO VERIFY Reticulocyte Hemoglobin testing may be clinically indicated, consider ordering this additional test [id number] THIS CRITICAL RESULT HAS VERIFIED AND BEEN CALLED TO T [place] RN BY KIRSTENE FORSYTH ON 02 04 2022 AT 1319, AND HAS BEEN READ BACK.     HCT 19.2 (L) 39.0 - 52.0 %   MCV 62.3 (L) 80.0 - 100.0 fL   MCH 17.5 (L) 26.0 - 34.0 pg   MCHC 28.1 (L) 30.0 - 36.0 g/dL   RDW 18.5 (H) 11.5 - 15.5 %   Platelets 1,165 (HH) 150 - 400 K/uL    Comment: REPEATED TO VERIFY PLATELET COUNT CONFIRMED BY SMEAR THIS CRITICAL RESULT HAS VERIFIED AND BEEN CALLED TO T SHROP[place]IRE RN BY KIRSTENE FORSYTH ON 02 04 2022 AT 1340, AND HAS BEEN READ BACK.     nRBC 0.0 0.0 - 0.2 %   Neutrophils Relative % 95 %   Neutro Abs 20.3 (H) 1.7 - 7.7 K/uL   Lymphocytes Relative 4 %   Lymphs Abs 0.9 0.7 - 4.0 K/uL   Monocytes Relative 1 %   Monocytes Absolute 0.2 0.1 - 1.0 K/uL   Eosinophils Relative 0 %   Eosinophils Absolute 0.0 0.0 - 0.5 K/uL   Basophils Relative 0 %   Basophils Absolute 0.0 0.0 - 0.1 K/uL   WBC Morphology See Note     Comment: Increased Bands. >20% Bands   nRBC 0 0 /100 WBC   Abs Immature Granulocytes 0.00 0.00 - 0.07 K/uL   Tear Drop Cells PRESENT      Comment: Performed at [facility], [place] [address], [place], [place] [address]  Type and screen [place]     Status: None (Preliminary result)   Collection Time: 11/15/20  1:52 PM  Result Value Ref Range   ABO/RH(D) A POS    Antibody Screen NEG    Sample Expiration 11/18/2020,2359    Unit Number [id number]    Blood Component Type RED CELLS,LR    Unit  division 00    Status of Unit ALLOCATED    Transfusion Status OK TO TRANSFUSE    Crossmatch Result Compatible    Unit Number [id number]    Blood Component Type RED CELLS,LR    Unit division 00    Status of Unit ISSUED    Transfusion Status OK TO TRANSFUSE    Crossmatch Result      Compatible Performed at [facility], [place] [address], [place], [place] [address]   Prepare RBC (crossmatch)     Status: None   Collection Time: 11/15/20  1:59 PM  Result Value Ref Range   Order Confirmation      ORDER PROCESSED BY BLOOD BANK Performed at [facility], [place] [address], [place], [place] [address]   D-dimer, quantitative     Status: Abnormal   Collection Time: 11/15/20  2:29 PM  Result Value Ref Range   D-Dimer, Quant 3.04 (H) 0.00 - 0.50 ug/mL-FEU    Comment: (NOTE) At the manufacturer cut-off value of 0.5 g/mL FEU, this assay has a negative predictive value of 95-100%.This assay is intended for use in conjunction with a clinical pretest probability (PTP) assessment model to exclude pulmonary embolism (PE) and deep venous thrombosis (DVT) in outpatients suspected of PE or DVT. Results should be correlated with clinical presentation. Performed at [facility], [place] [address], [place], [place] [address]   Procalcitonin     Status: None   Collection Time: 11/15/20  2:29 PM  Result Value Ref Range   Procalcitonin 71.10 ng/mL    Comment:        Interpretation: PCT >= 10 ng/mL: Important systemic inflammatory response, almost exclusively due to severe bacterial sepsis or septic  shock. (NOTE)       Sepsis PCT Algorithm           Lower Respiratory Tract                                      Infection PCT Algorithm    ----------------------------     ----------------------------         PCT < 0.25 ng/mL                PCT < 0.10 ng/mL          Strongly encourage             Strongly discourage   discontinuation of antibiotics    initiation of antibiotics    ----------------------------     -----------------------------       PCT 0.25 - 0.50 ng/mL            PCT 0.10 - 0.25 ng/mL               OR       >80% decrease in PCT            Discourage initiation of                                            antibiotics      Encourage discontinuation           of antibiotics    ----------------------------     -----------------------------         PCT >= 0.50 ng/mL  PCT 0.26 - 0.50 ng/mL                AND       <80% decrease in PCT             Encourage initiation of                                             antibiotics       Encourage continuation           of antibiotics    ----------------------------     -----------------------------        PCT >= 0.50 ng/mL                  PCT > 0.50 ng/mL               AND         increase in PCT                  Strongly encourage                                      initiation of antibiotics    Strongly encourage escalation           of antibiotics                                     -----------------------------                                           PCT <= 0.25 ng/mL                                                 OR                                        > 80% decrease in PCT                                      Discontinue / Do not initiate                                             antibiotics  Performed at [facility], [address], [place], [place] [address]   Lactate dehydrogenase     Status: None   Collection Time: 11/15/20  2:29 PM  Result Value Ref Range   LDH 98 98 - 192 U/L     Comment: Performed at [facility], [place] [address], [place], [place] [address]  Ferritin     Status: None   Collection Time: 11/15/20  2:29 PM  Result Value  Ref Range   Ferritin 64 24 - 336 ng/mL    Comment: Performed at [facility], [address] [place], [place] [address]  Fibrinogen     Status: Abnormal   Collection Time: 11/15/20  2:29 PM  Result Value Ref Range   Fibrinogen 771 (H) 210 - 475 mg/dL    Comment: Performed at [facility], [address], [place], [place] [address]  C-reactive protein     Status: Abnormal   Collection Time: 11/15/20  2:29 PM  Result Value Ref Range   CRP 21.5 (H) <1.0 mg/dL    Comment: Performed at [facility], [address], [place], [place] [address]  Triglycerides     Status: None   Collection Time: 11/15/20  2:29 PM  Result Value Ref Range   Triglycerides 91 <150 mg/dL    Comment: Performed at [facility], [address] [place], [place] [address]  ABO/Rh     Status: None   Collection Time: 11/15/20  2:30 PM  Result Value Ref Range   ABO/RH(D)      A POS Performed at [facility], [address] [place], [place] [address]   Reticulocytes     Status: Abnormal   Collection Time: 11/15/20  3:20 PM  Result Value Ref Range   Retic Ct Pct 1.8 0.4 - 3.1 %   RBC. 2.77 (L) 4.22 - 5.81 MIL/uL   Retic Count, Absolute 50.7 19.0 - 186.0 K/uL   Immature Retic Fract 21.1 (H) 2.3 - 15.9 %    Comment: Performed at [facility], [address], [place], [place] [address]  Protime-INR     Status: None   Collection Time: 11/15/20  3:20 PM  Result Value Ref Range   Prothrombin Time 14.5 11.4 - 15.2 seconds   INR 1.2 0.8 - 1.2    Comment: (NOTE) INR goal varies based on device and disease states. Performed at [facility], [address], [place], [place] [address]     CT Angio Chest PE W and/or Wo Contrast  Result Date: 11/15/2020 CLINICAL DATA:  Positive D-dimer, COVID-19 positive, tobacco  abuse, right lower quadrant abdominal pain EXAM: CT ANGIOGRAPHY CHEST CT ABDOMEN AND PELVIS WITH CONTRAST TECHNIQUE: Multidetector CT imaging of the chest was performed using the standard protocol during bolus administration of intravenous contrast. Multiplanar CT image reconstructions and MIPs were obtained to evaluate the vascular anatomy. Multidetector CT imaging of the abdomen and pelvis was performed using the standard protocol during bolus administration of intravenous contrast. CONTRAST:  19mL OMNIPAQUE IOHEXOL 350 MG/ML SOLN COMPARISON:  11/15/2020 FINDINGS: CTA CHEST FINDINGS Cardiovascular: This is a technically adequate evaluation of the pulmonary vasculature. No filling defects or pulmonary emboli. The heart is unremarkable without pericardial effusion. Mild atherosclerosis within the LAD distribution of the coronary vasculature. Normal caliber of the thoracic aorta without evidence of dissection. Mediastinum/Nodes: Mild wall thickening of the distal thoracic esophagus is nonspecific and could reflect esophagitis. Thyroid and trachea are unremarkable. No pathologic adenopathy. Lungs/Pleura: No acute airspace disease, effusion, or pneumothorax. Mild upper lobe predominant emphysema with areas of subpleural scarring. Central airways are patent. Musculoskeletal: No acute or destructive bony lesions. Reconstructed images demonstrate no additional findings. Review of the MIP images confirms the above findings. CT ABDOMEN and PELVIS FINDINGS Hepatobiliary: There is an indeterminate hypodensity within the inferior aspect right lobe liver, measuring approximately 3.5 x 2.5 cm reference image 36/4. No other focal  liver abnormalities. The gallbladder is unremarkable. No intrahepatic duct dilation. Pancreas: Unremarkable. No pancreatic ductal dilatation or surrounding inflammatory changes. Spleen: Normal in size without focal abnormality. Adrenals/Urinary Tract: Adrenal glands are unremarkable. Kidneys are normal,  without renal calculi, focal lesion, or hydronephrosis. Bladder is unremarkable. Stomach/Bowel: There is masslike wall thickening involving the cecum, extending approximately 9 cm in length. There is evidence of cecal perforation posteriorly, with a complex multiloculated retroperitoneal fluid collection. There are intramuscular components of the fluid collection within the right psoas and iliacus muscles, with a fluid collection extending into the proximal aspect of the iliopsoas tendon. This collection measures approximately 9.1 x 6.7 cm in transverse dimension, and extends approximately 18 cm in craniocaudal length. Differential diagnosis includes neoplasm or severe colitis. A normal gas-filled appendix is seen in the right lower quadrant. Vascular/Lymphatic: No discrete adenopathy within the abdomen or pelvis. Moderate atherosclerosis of the aorta. Reproductive: Prostate is unremarkable. Other: There is no free intraperitoneal fluid or free gas. No abdominal wall hernia. Musculoskeletal: No acute or destructive bony lesions. Reconstructed images demonstrate no additional findings. Review of the MIP images confirms the above findings. IMPRESSION: 1. Long segment masslike mural thickening of the cecum, concerning for neoplasm. 2. Posterior perforation at the site of cecal wall thickening, with multilocular right retroperitoneal abscess which extends into the right psoas and right iliacus muscles as above. 3. Indeterminate hypodensity inferior right lobe liver. Dedicated nonemergent outpatient liver MRI could be performed for definitive characterization. 4. No evidence of pulmonary embolus. 5. Mild wall thickening of the distal thoracic esophagus, which could reflect mild esophagitis. 6. Aortic Atherosclerosis (ICD10-I70.0) and Emphysema (ICD10-J43.9). Critical Value/emergent results were called by telephone at the time of interpretation on 11/15/2020 at 4:16 pm to provider [place], who verbally acknowledged  these results. Electronically Signed   By: Randa Ngo M.D.   On: 11/15/2020 16:22   CT ABDOMEN PELVIS W CONTRAST  Result Date: 11/15/2020 CLINICAL DATA:  Positive D-dimer, COVID-19 positive, tobacco abuse, right lower quadrant abdominal pain EXAM: CT ANGIOGRAPHY CHEST CT ABDOMEN AND PELVIS WITH CONTRAST TECHNIQUE: Multidetector CT imaging of the chest was performed using the standard protocol during bolus administration of intravenous contrast. Multiplanar CT image reconstructions and MIPs were obtained to evaluate the vascular anatomy. Multidetector CT imaging of the abdomen and pelvis was performed using the standard protocol during bolus administration of intravenous contrast. CONTRAST:  12mL OMNIPAQUE IOHEXOL 350 MG/ML SOLN COMPARISON:  11/15/2020 FINDINGS: CTA CHEST FINDINGS Cardiovascular: This is a technically adequate evaluation of the pulmonary vasculature. No filling defects or pulmonary emboli. The heart is unremarkable without pericardial effusion. Mild atherosclerosis within the LAD distribution of the coronary vasculature. Normal caliber of the thoracic aorta without evidence of dissection. Mediastinum/Nodes: Mild wall thickening of the distal thoracic esophagus is nonspecific and could reflect esophagitis. Thyroid and trachea are unremarkable. No pathologic adenopathy. Lungs/Pleura: No acute airspace disease, effusion, or pneumothorax. Mild upper lobe predominant emphysema with areas of subpleural scarring. Central airways are patent. Musculoskeletal: No acute or destructive bony lesions. Reconstructed images demonstrate no additional findings. Review of the MIP images confirms the above findings. CT ABDOMEN and PELVIS FINDINGS Hepatobiliary: There is an indeterminate hypodensity within the inferior aspect right lobe liver, measuring approximately 3.5 x 2.5 cm reference image 36/4. No other focal liver abnormalities. The gallbladder is unremarkable. No intrahepatic duct dilation. Pancreas:  Unremarkable. No pancreatic ductal dilatation or surrounding inflammatory changes. Spleen: Normal in size without focal abnormality. Adrenals/Urinary Tract: Adrenal glands are unremarkable. Kidneys are  normal, without renal calculi, focal lesion, or hydronephrosis. Bladder is unremarkable. Stomach/Bowel: There is masslike wall thickening involving the cecum, extending approximately 9 cm in length. There is evidence of cecal perforation posteriorly, with a complex multiloculated retroperitoneal fluid collection. There are intramuscular components of the fluid collection within the right psoas and iliacus muscles, with a fluid collection extending into the proximal aspect of the iliopsoas tendon. This collection measures approximately 9.1 x 6.7 cm in transverse dimension, and extends approximately 18 cm in craniocaudal length. Differential diagnosis includes neoplasm or severe colitis. A normal gas-filled appendix is seen in the right lower quadrant. Vascular/Lymphatic: No discrete adenopathy within the abdomen or pelvis. Moderate atherosclerosis of the aorta. Reproductive: Prostate is unremarkable. Other: There is no free intraperitoneal fluid or free gas. No abdominal wall hernia. Musculoskeletal: No acute or destructive bony lesions. Reconstructed images demonstrate no additional findings. Review of the MIP images confirms the above findings. IMPRESSION: 1. Long segment masslike mural thickening of the cecum, concerning for neoplasm. 2. Posterior perforation at the site of cecal wall thickening, with multilocular right retroperitoneal abscess which extends into the right psoas and right iliacus muscles as above. 3. Indeterminate hypodensity inferior right lobe liver. Dedicated nonemergent outpatient liver MRI could be performed for definitive characterization. 4. No evidence of pulmonary embolus. 5. Mild wall thickening of the distal thoracic esophagus, which could reflect mild esophagitis. 6. Aortic  Atherosclerosis (ICD10-I70.0) and Emphysema (ICD10-J43.9). Critical Value/emergent results were called by telephone at the time of interpretation on 11/15/2020 at 4:16 pm to provider [place], who verbally acknowledged these results. Electronically Signed   By: Randa Ngo M.D.   On: 11/15/2020 16:22   DG Chest Portable 1 View  Result Date: 11/15/2020 CLINICAL DATA:  COVID EXAM: PORTABLE CHEST 1 VIEW COMPARISON:  None. FINDINGS: Evaluation is limited secondary to patient rotation. The cardiomediastinal silhouette is normal in contour. Blunting of the RIGHT costophrenic angle. No pneumothorax. No acute pleuroparenchymal abnormality. Visualized abdomen is unremarkable. No acute osseous abnormality noted. IMPRESSION: Blunting of the RIGHT costophrenic angle may reflect a small pleural effusion. Electronically Signed   By: Valentino Saxon MD   On: 11/15/2020 13:27    Review of Systems  Constitutional: Negative for chills and fever.  HENT: Negative for ear discharge, hearing loss and sore throat.   Eyes: Negative for discharge.  Respiratory: Negative for cough and shortness of breath.   Cardiovascular: Negative for chest pain and leg swelling.  Gastrointestinal: Negative for abdominal pain, constipation, diarrhea, nausea and vomiting.  Musculoskeletal: Positive for arthralgias (R hip). Negative for myalgias and neck pain.  Skin: Negative for rash.  Allergic/Immunologic: Negative for environmental allergies.  Neurological: Negative for dizziness and seizures.  Hematological: Does not bruise/bleed easily.  Psychiatric/Behavioral: Negative for suicidal ideas.  All other systems reviewed and are negative.  Blood pressure 115/80, pulse 98, temperature 98.5 F (36.9 C), temperature source Oral, resp. rate 19, SpO2 99 %. Physical Exam Constitutional:      General: Vital signs are normal.     Appearance: He is well-developed and well-nourished.     Comments: Conversant No acute distress   HENT:     Head: Normocephalic and atraumatic.     Mouth/Throat:     Mouth: Mucous membranes are moist.  Eyes:     General: Lids are normal. No scleral icterus.    Pupils: Pupils are equal, round, and reactive to light.     Comments: Pupils are equal round and reactive No lid lag Moist conjunctiva  Neck:     Thyroid: No thyromegaly.     Trachea: No tracheal tenderness.     Comments: No cervical lymphadenopathy Cardiovascular:     Rate and Rhythm: Regular rhythm. Tachycardia present.     Pulses: Intact distal pulses.     Heart sounds: No murmur heard.   Pulmonary:     Effort: Pulmonary effort is normal.     Breath sounds: Normal breath sounds. No wheezing or rales.  Abdominal:     General: Bowel sounds are normal. There is no distension.     Palpations: There is mass (RLQ). There is no hepatosplenomegaly.     Tenderness: There is abdominal tenderness (RLQ). There is no guarding or rebound.     Hernia: No hernia is present.  Musculoskeletal:        General: Normal range of motion.     Cervical back: Normal range of motion.  Skin:    General: Skin is warm.     Findings: No rash.     Nails: There is no clubbing or cyanosis.     Comments: Normal skin turgor  Neurological:     Mental Status: He is alert and oriented to person, place, and time.     Comments: Normal gait and station  Psychiatric:        Judgment: Judgment normal.     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  Patient currently undergoing blood transfusion. 2.  Patient undergone correction of electrolytes. 3.  Would recommend CEA level 4.  Would recommend CT of his chest for staging work-up.    Ralene Ok 11/15/2020, 4:56 PM

## 2020-11-15 NOTE — Sepsis Progress Note (Signed)
eLink monitoring this Code Sepsis. 

## 2020-11-15 NOTE — ED Triage Notes (Signed)
Pt c/o R groin pain with radiation down R leg, intermittent numbness, pt uses cane @ home. Pt appears pale, tachy and febrile in triage. +covid exposure at work. +intermittent cough

## 2020-11-15 NOTE — Progress Notes (Signed)
Patient report received and patient on the floor. Patient c/o pain at this time. Will give pain medication as ordered. Will continue to monitor.

## 2020-11-15 NOTE — ED Provider Notes (Signed)
Patient is a 50 year old man who reportedly has not been to a doctor prior to today in over 10 years who presents today for evaluation of right groin pain and right leg pain after a positive Covid exposure.  I assumed care of patient from previous team at shift change, please see their note for full H&P.  Briefly patient is Covid positive.  He has been febrile, tachycardic and tachypneic.  He has multiple significant laboratory abnormalities including leukocytosis of 21, hemoglobin of 5.4, he has leukocytosis with platelets of 1165, over 20% bands.  Occult blood testing was obtained at outside facility prior to arrival and was reportedly negative, therefore not repeated in the ER.  2 units of blood have already been ordered.  CTA PE study and CT abdomen pelvis ordered.    Physical Exam  BP 121/79   Pulse 95   Temp 98.5 F (36.9 C) (Oral)   Resp (!) 23   SpO2 98%   Physical Exam Constitutional:      Appearance: He is cachectic. He is ill-appearing (Frail, chronically ill-appearing).  HENT:     Head:     Comments: Significant bitemporal muscle wasting Eyes:     Conjunctiva/sclera: Conjunctivae normal.  Cardiovascular:     Rate and Rhythm: Tachycardia present.  Pulmonary:     Effort: No respiratory distress.  Abdominal:     Tenderness: There is abdominal tenderness (Mild, right lower quadrant). There is no guarding or rebound.  Genitourinary:    Comments: Deferred Musculoskeletal:        General: No deformity.     Cervical back: Normal range of motion.  Skin:    General: Skin is warm and dry.  Neurological:     Comments: Facial movements are symmetric.  He is awake and alert, speech is not slurred.  Able to answer questions without difficulty.    Psychiatric:        Behavior: Behavior normal.     ED Course/Procedures   Clinical Course as of 11/15/20 1956  Fri Nov 15, 2020  1615 Cecal mass with perf sand laerge retroperitoneal, IM components,  [EH]  1820 Repaged  unassigned med admit  [EH]  1914 I spoke with Dr. Posey Pronto who will see patient.  [EH]    Clinical Course User Index [EH] Sinan, Tuch, PA-C    .Critical Care Performed by: Lorin Glass, PA-C Authorized by: Lorin Glass, PA-C   Critical care provider statement:    Critical care time (minutes):  45   Critical care time was exclusive of:  Separately billable procedures and treating other patients   Critical care was necessary to treat or prevent imminent or life-threatening deterioration of the following conditions:  Sepsis   Critical care was time spent personally by me on the following activities:  Discussions with consultants, evaluation of patient's response to treatment, examination of patient, ordering and performing treatments and interventions, ordering and review of laboratory studies, ordering and review of radiographic studies, pulse oximetry, re-evaluation of patient's condition, obtaining history from patient or surrogate and review of old charts   Care discussed with: admitting provider      MDM   Plan to follow up on CTs, additional lab results.  I added on anemia panel, will require admission.  I spoke with radiologist who informed of mass.  General surgery consult.  I infomred patient of results.   I spoke with Claiborne Billings of General surgery PA who will see patient for consult.   Dr.  Rosendo Gros of surgery recommends keeping patient n.p.o. for now.   While patient initially did not have a clear bacterial source and was simply Covid positive code sepsis had not been initiated. Given concern for retroperitoneal abscess along with perforation, patient being febrile up to 100.7 on arrival, with heart rates consistently over 90 as high as 127 on arrival with respiratory rate of 24 on arrival and significant leukocytosis code sepsis is called.  Pharmacy is consulted for antibiotic assistance.   I spoke with Pharmacy who recommended zosyn coverage.  He is not  hypotensive and his lactic acid is not over 4 therefore he does not require full 30/kg fluid bolus.  For now additionally with him being Covid positive wish to be conservative with IV fluids.  He had already gotten 500 mL earlier, will give an additional 500 mL now.  I spoke with Dr. Posey Pronto who will see the patient for admission.  Note: Portions of this report may have been transcribed using voice recognition software. Every effort was made to ensure accuracy; however, inadvertent computerized transcription errors may be present    CT Angio Chest PE W and/or Wo Contrast  Result Date: 11/15/2020 CLINICAL DATA:  Positive D-dimer, COVID-19 positive, tobacco abuse, right lower quadrant abdominal pain EXAM: CT ANGIOGRAPHY CHEST CT ABDOMEN AND PELVIS WITH CONTRAST TECHNIQUE: Multidetector CT imaging of the chest was performed using the standard protocol during bolus administration of intravenous contrast. Multiplanar CT image reconstructions and MIPs were obtained to evaluate the vascular anatomy. Multidetector CT imaging of the abdomen and pelvis was performed using the standard protocol during bolus administration of intravenous contrast. CONTRAST:  70mL OMNIPAQUE IOHEXOL 350 MG/ML SOLN COMPARISON:  11/15/2020 FINDINGS: CTA CHEST FINDINGS Cardiovascular: This is a technically adequate evaluation of the pulmonary vasculature. No filling defects or pulmonary emboli. The heart is unremarkable without pericardial effusion. Mild atherosclerosis within the LAD distribution of the coronary vasculature. Normal caliber of the thoracic aorta without evidence of dissection. Mediastinum/Nodes: Mild wall thickening of the distal thoracic esophagus is nonspecific and could reflect esophagitis. Thyroid and trachea are unremarkable. No pathologic adenopathy. Lungs/Pleura: No acute airspace disease, effusion, or pneumothorax. Mild upper lobe predominant emphysema with areas of subpleural scarring. Central airways are patent.  Musculoskeletal: No acute or destructive bony lesions. Reconstructed images demonstrate no additional findings. Review of the MIP images confirms the above findings. CT ABDOMEN and PELVIS FINDINGS Hepatobiliary: There is an indeterminate hypodensity within the inferior aspect right lobe liver, measuring approximately 3.5 x 2.5 cm reference image 36/4. No other focal liver abnormalities. The gallbladder is unremarkable. No intrahepatic duct dilation. Pancreas: Unremarkable. No pancreatic ductal dilatation or surrounding inflammatory changes. Spleen: Normal in size without focal abnormality. Adrenals/Urinary Tract: Adrenal glands are unremarkable. Kidneys are normal, without renal calculi, focal lesion, or hydronephrosis. Bladder is unremarkable. Stomach/Bowel: There is masslike wall thickening involving the cecum, extending approximately 9 cm in length. There is evidence of cecal perforation posteriorly, with a complex multiloculated retroperitoneal fluid collection. There are intramuscular components of the fluid collection within the right psoas and iliacus muscles, with a fluid collection extending into the proximal aspect of the iliopsoas tendon. This collection measures approximately 9.1 x 6.7 cm in transverse dimension, and extends approximately 18 cm in craniocaudal length. Differential diagnosis includes neoplasm or severe colitis. A normal gas-filled appendix is seen in the right lower quadrant. Vascular/Lymphatic: No discrete adenopathy within the abdomen or pelvis. Moderate atherosclerosis of the aorta. Reproductive: Prostate is unremarkable. Other: There is no free intraperitoneal fluid  or free gas. No abdominal wall hernia. Musculoskeletal: No acute or destructive bony lesions. Reconstructed images demonstrate no additional findings. Review of the MIP images confirms the above findings. IMPRESSION: 1. Long segment masslike mural thickening of the cecum, concerning for neoplasm. 2. Posterior perforation  at the site of cecal wall thickening, with multilocular right retroperitoneal abscess which extends into the right psoas and right iliacus muscles as above. 3. Indeterminate hypodensity inferior right lobe liver. Dedicated nonemergent outpatient liver MRI could be performed for definitive characterization. 4. No evidence of pulmonary embolus. 5. Mild wall thickening of the distal thoracic esophagus, which could reflect mild esophagitis. 6. Aortic Atherosclerosis (ICD10-I70.0) and Emphysema (ICD10-J43.9). Critical Value/emergent results were called by telephone at the time of interpretation on 11/15/2020 at 4:16 pm to provider PA Tanishi Nault, who verbally acknowledged these results. Electronically Signed   By: Michael  Brown M.D.   On: 11/15/2020 16:22   CT ABDOMEN PELVIS W CONTRAST  Result Date: 11/15/2020 CLINICAL DATA:  Positive D-dimer, COVID-19 positive, tobacco abuse, right lower quadrant abdominal pain EXAM: CT ANGIOGRAPHY CHEST CT ABDOMEN AND PELVIS WITH CONTRAST TECHNIQUE: Multidetector CT imaging of the chest was performed using the standard protocol during bolus administration of intravenous contrast. Multiplanar CT image reconstructions and MIPs were obtained to evaluate the vascular anatomy. Multidetector CT imaging of the abdomen and pelvis was performed using the standard protocol during bolus administration of intravenous contrastGulf Breeze HospiKentuckWLower Conee CSmokey Point Behaivoral HospiKentuckWoElmore Community HospiThe Miriam Avera [facility] Anthony'S HospiKHealthsouth [facility] WoKentuckWolfgang [id number]-Sherri R[address] [facility] Surgery CenterKentuckWolfgang [id number]-Sherri [id number] Hospital CenKentuckWolfgang PLehigh ValleAspire Behavioral Health Of ConKentuckWolfgang Phoe938mUpstate GastroenteroloSelect Specialty Hospital - PhoeKentuckCornerstone Hospital Houston - BellaKentuckMedstar Surgery Center [facility] KentAdventist [facility] CenKentuckWolfgang [id number] [id number]-Sherri R914Select SpeciAdvMccannel Eye SurgeryaLoretha Staple ar: This is a technically adequate evaluation of the pulmonary vasculature. No filling defects or pulmonary emboli. The heart is unremarkable without pericardial effusion. Mild atherosclerosis within the LAD distribution of the coronary vasculature. Normal caliber of the thoracic aorta without evidence of dissection. Mediastinum/Nodes: Mild wall thickening of the distal thoracic esophagus is nonspecific and could reflect esophagitis. Thyroid and trachea are  unremarkable. No pathologic adenopathy. Lungs/Pleura: No acute airspace disease, effusion, or pneumothorax. Mild upper lobe predominant emphysema with areas of subpleural scarring. Central airways are patent. Musculoskeletal: No acute or destructive bony lesions. Reconstructed images demonstrate no additional findings. Review of the MIP images confirms the above findings. CT ABDOMEN and PELVIS FINDINGS Hepatobiliary: There is an indeterminate hypodensity within the inferior aspect right lobe liver, measuring approximately 3.5 x 2.5 cm reference image 36/4. No other focal liver abnormalities. The gallbladder is unremarkable. No intrahepatic duct dilation. Pancreas: Unremarkable. No pancreatic ductal dilatation or surrounding inflammatory changes. Spleen: Normal in size without focal abnormality. Adrenals/Urinary Tract: Adrenal glands are unremarkable. Kidneys are normal, without renal calculi, focal lesion, or hydronephrosis. Bladder is unremarkable. Stomach/Bowel: There is masslike wall thickening involving the cecum, extending approximately 9 cm in length. There is evidence of cecal perforation posteriorly, with a complex multiloculated retroperitoneal fluid collection. There are intramuscular components of the fluid collection within the right psoas and iliacus muscles, with a fluid collection extending into the proximal aspect of the iliopsoas tendon. This collection measures approximately 9.1 x 6.7 cm in transverse dimension, and extends approximately 18 cm in craniocaudal length. Differential diagnosis includes neoplasm or severe colitis. A normal gas-filled appendix is seen in the right lower quadrant. Vascular/Lymphatic: No discrete adenopathy within the abdomen or pelvis. Moderate atherosclerosis of the aorta. Reproductive: Prostate is unremarkable. Other: There is no free intraperitoneal fluid or free gas. No abdominal wall hernia. Musculoskeletal: No acute or destructive bony lesions. Reconstructed images  demonstrate no additional findings. Review of the MIP images confirms the above findings. IMPRESSION: 1. Long segment masslike mural  thickening of the cecum, concerning for neoplasm. 2. Posterior perforation at the site of cecal wall thickening, with multilocular right retroperitoneal abscess which extends into the right psoas and right iliacus muscles as above. 3. Indeterminate hypodensity inferior right lobe liver. Dedicated nonemergent outpatient liver MRI could be performed for definitive characterization. 4. No evidence of pulmonary embolus. 5. Mild wall thickening of the distal thoracic esophagus, which could reflect mild esophagitis. 6. Aortic Atherosclerosis (ICD10-I70.0) and Emphysema (ICD10-J43.9). Critical Value/emergent results were called by telephone at the time of interpretation on 11/15/2020 at 4:16 pm to provider [place], who verbally acknowledged these results. Electronically Signed   By: Randa Ngo M.D.   On: 11/15/2020 16:22   DG Chest Portable 1 View  Result Date: 11/15/2020 CLINICAL DATA:  COVID EXAM: PORTABLE CHEST 1 VIEW COMPARISON:  None. FINDINGS: Evaluation is limited secondary to patient rotation. The cardiomediastinal silhouette is normal in contour. Blunting of the RIGHT costophrenic angle. No pneumothorax. No acute pleuroparenchymal abnormality. Visualized abdomen is unremarkable. No acute osseous abnormality noted. IMPRESSION: Blunting of the RIGHT costophrenic angle may reflect a small pleural effusion. Electronically Signed   By: Valentino Saxon MD   On: 11/15/2020 13:27    Labs Reviewed  SARS CORONAVIRUS 2 BY RT PCR (HOSPITAL ORDER, [facility]) - Abnormal; Notable for the following components:      Result Value   SARS Coronavirus 2 POSITIVE (*)    All other components within normal limits  COMPREHENSIVE METABOLIC PANEL - Abnormal; Notable for the following components:   Sodium 129 (*)    CO2 20 (*)    Glucose, Bld 115 (*)     Calcium 8.2 (*)    Albumin 1.9 (*)    AST 14 (*)    Total Bilirubin 0.2 (*)    All other components within normal limits  CBC WITH DIFFERENTIAL/PLATELET - Abnormal; Notable for the following components:   WBC 21.4 (*)    RBC 3.08 (*)    Hemoglobin 5.4 (*)    HCT 19.2 (*)    MCV 62.3 (*)    MCH 17.5 (*)    MCHC 28.1 (*)    RDW 18.5 (*)    Platelets 1,165 (*)    Neutro Abs 20.3 (*)    All other components within normal limits  D-DIMER, QUANTITATIVE (NOT AT [facility]) - Abnormal; Notable for the following components:   D-Dimer, Quant 3.04 (*)    All other components within normal limits  FIBRINOGEN - Abnormal; Notable for the following components:   Fibrinogen 771 (*)    All other components within normal limits  C-REACTIVE PROTEIN - Abnormal; Notable for the following components:   CRP 21.5 (*)    All other components within normal limits  IRON AND TIBC - Abnormal; Notable for the following components:   Iron 8 (*)    TIBC 228 (*)    Saturation Ratios 4 (*)    All other components within normal limits  RETICULOCYTES - Abnormal; Notable for the following components:   RBC. 2.77 (*)    Immature Retic Fract 21.1 (*)    All other components within normal limits  CULTURE, BLOOD (ROUTINE X 2)  CULTURE, BLOOD (ROUTINE X 2)  LACTIC ACID, PLASMA  PROCALCITONIN  LACTATE DEHYDROGENASE  FERRITIN  TRIGLYCERIDES  VITAMIN B12  FOLATE  PROTIME-INR  URINALYSIS, ROUTINE W REFLEX MICROSCOPIC  PATHOLOGIST SMEAR REVIEW  CEA  POC OCCULT BLOOD, ED  TYPE AND SCREEN  PREPARE RBC (CROSSMATCH)  ABO/RH     Medications  lactated ringers infusion (has no administration in time range)  fentaNYL (SUBLIMAZE) injection 50 mcg (has no administration in time range)  acetaminophen (TYLENOL) tablet 650 mg (650 mg Oral Given 11/15/20 1248)  0.9 %  sodium chloride infusion (10 mL/hr Intravenous New Bag/Given 11/15/20 1644)  fentaNYL (SUBLIMAZE) injection 50 mcg (50 mcg Intravenous Given 11/15/20 1423)   ondansetron (ZOFRAN) injection 4 mg (4 mg Intravenous Given 11/15/20 1424)  sodium chloride 0.9 % bolus 500 mL (0 mLs Intravenous Stopped 11/15/20 1705)  iohexol (OMNIPAQUE) 350 MG/ML injection 80 mL (80 mLs Intravenous Contrast Given 11/15/20 1559)  lactated ringers bolus 500 mL (0 mLs Intravenous Stopped 11/15/20 1845)  piperacillin-tazobactam (ZOSYN) IVPB 3.375 g (0 g Intravenous Stopped 11/15/20 1826)        Y[place]ance, [place], PA-C 11/15/20 Espino, [place], DO 11/18/20 [id number]

## 2020-11-16 ENCOUNTER — Other Ambulatory Visit: Payer: Self-pay

## 2020-11-16 DIAGNOSIS — E871 Hypo-osmolality and hyponatremia: Secondary | ICD-10-CM

## 2020-11-16 DIAGNOSIS — U071 COVID-19: Secondary | ICD-10-CM

## 2020-11-16 DIAGNOSIS — A419 Sepsis, unspecified organism: Principal | ICD-10-CM

## 2020-11-16 DIAGNOSIS — K6389 Other specified diseases of intestine: Secondary | ICD-10-CM

## 2020-11-16 DIAGNOSIS — D500 Iron deficiency anemia secondary to blood loss (chronic): Secondary | ICD-10-CM

## 2020-11-16 LAB — D-DIMER, QUANTITATIVE: D-Dimer, Quant: 3.76 ug/mL-FEU — ABNORMAL HIGH (ref 0.00–0.50)

## 2020-11-16 LAB — C-REACTIVE PROTEIN: CRP: 21.4 mg/dL — ABNORMAL HIGH (ref <1.0)

## 2020-11-16 LAB — CBC WITH DIFFERENTIAL/PLATELET
Abs Immature Granulocytes: 0.24 10*3/uL — ABNORMAL HIGH (ref 0.00–0.07)
Basophils Absolute: 0.1 10*3/uL (ref 0.0–0.1)
Basophils Relative: 0 %
Eosinophils Absolute: 0 10*3/uL (ref 0.0–0.5)
Eosinophils Relative: 0 %
HCT: 22.6 % — ABNORMAL LOW (ref 39.0–52.0)
Hemoglobin: 7 g/dL — ABNORMAL LOW (ref 13.0–17.0)
Immature Granulocytes: 1 %
Lymphocytes Relative: 9 %
Lymphs Abs: 1.7 10*3/uL (ref 0.7–4.0)
MCH: 20.8 pg — ABNORMAL LOW (ref 26.0–34.0)
MCHC: 31 g/dL (ref 30.0–36.0)
MCV: 67.1 fL — ABNORMAL LOW (ref 80.0–100.0)
Monocytes Absolute: 1.2 10*3/uL — ABNORMAL HIGH (ref 0.1–1.0)
Monocytes Relative: 7 %
Neutro Abs: 15.6 10*3/uL — ABNORMAL HIGH (ref 1.7–7.7)
Neutrophils Relative %: 83 %
Platelets: 954 10*3/uL (ref 150–400)
RBC: 3.37 MIL/uL — ABNORMAL LOW (ref 4.22–5.81)
RDW: 25.2 % — ABNORMAL HIGH (ref 11.5–15.5)
WBC: 18.8 10*3/uL — ABNORMAL HIGH (ref 4.0–10.5)
nRBC: 0 % (ref 0.0–0.2)

## 2020-11-16 LAB — PHOSPHORUS: Phosphorus: 4.1 mg/dL (ref 2.5–4.6)

## 2020-11-16 LAB — URINALYSIS, ROUTINE W REFLEX MICROSCOPIC
Bilirubin Urine: NEGATIVE
Glucose, UA: NEGATIVE mg/dL
Hgb urine dipstick: NEGATIVE
Ketones, ur: NEGATIVE mg/dL
Leukocytes,Ua: NEGATIVE
Nitrite: NEGATIVE
Protein, ur: NEGATIVE mg/dL
Specific Gravity, Urine: 1.044 — ABNORMAL HIGH (ref 1.005–1.030)
pH: 5 (ref 5.0–8.0)

## 2020-11-16 LAB — COMPREHENSIVE METABOLIC PANEL
ALT: 15 U/L (ref 0–44)
AST: 12 U/L — ABNORMAL LOW (ref 15–41)
Albumin: 1.6 g/dL — ABNORMAL LOW (ref 3.5–5.0)
Alkaline Phosphatase: 83 U/L (ref 38–126)
Anion gap: 11 (ref 5–15)
BUN: 13 mg/dL (ref 6–20)
CO2: 20 mmol/L — ABNORMAL LOW (ref 22–32)
Calcium: 7.8 mg/dL — ABNORMAL LOW (ref 8.9–10.3)
Chloride: 99 mmol/L (ref 98–111)
Creatinine, Ser: 0.77 mg/dL (ref 0.61–1.24)
GFR, Estimated: >60 mL/min (ref >60)
Glucose, Bld: 97 mg/dL (ref 70–99)
Potassium: 4.1 mmol/L (ref 3.5–5.1)
Sodium: 130 mmol/L — ABNORMAL LOW (ref 135–145)
Total Bilirubin: 0.8 mg/dL (ref 0.3–1.2)
Total Protein: 6 g/dL — ABNORMAL LOW (ref 6.5–8.1)

## 2020-11-16 LAB — CEA: CEA: 0.9 ng/mL (ref 0.0–4.7)

## 2020-11-16 LAB — MAGNESIUM: Magnesium: 2 mg/dL (ref 1.7–2.4)

## 2020-11-16 LAB — FERRITIN: Ferritin: 95 ng/mL (ref 24–336)

## 2020-11-16 LAB — HIV ANTIBODY (ROUTINE TESTING W REFLEX): HIV Screen 4th Generation wRfx: NONREACTIVE

## 2020-11-16 MED ORDER — ACETAMINOPHEN 650 MG RE SUPP
650.0000 mg | Freq: Four times a day (QID) | RECTAL | Status: DC | PRN
  Administered 2020-11-16: 650 mg via RECTAL
  Filled 2020-11-16 (×2): qty 1

## 2020-11-16 MED ORDER — INFLUENZA VAC SPLIT QUAD 0.5 ML IM SUSY: 0.5000 mL | PREFILLED_SYRINGE | INTRAMUSCULAR | Status: DC

## 2020-11-16 NOTE — Progress Notes (Signed)
   11/16/20 0128  Assess: MEWS Score  Temp 100 F (37.8 C)  BP 124/85  Pulse Rate (!) 102  ECG Heart Rate (!) 102  Resp (!) 21  Level of Consciousness Alert  SpO2 92 %  O2 Device Room Air  Patient Activity (if Appropriate) In bed  Assess: MEWS Score  MEWS Temp 0  MEWS Systolic 0  MEWS Pulse 1  MEWS RR 1  MEWS LOC 0  MEWS Score 2  MEWS Score Color Yellow  Assess: if the MEWS score is Yellow or Red  Were vital signs taken at a resting state? Yes  Focused Assessment No change from prior assessment  Early Detection of Sepsis Score *See Row Information* Medium  MEWS guidelines implemented *See Row Information* Yes  Treat  MEWS Interventions Other (Comment) (Continue to monitor - patient MEWS was yellow upon presentation to ED)  Take Vital Signs  Increase Vital Sign Frequency  Yellow: Q 2hr X 2 then Q 4hr X 2, if remains yellow, continue Q 4hrs  Escalate  MEWS: Escalate Yellow: discuss with charge nurse/RN and consider discussing with provider and RRT  Notify: Charge Nurse/RN  Name of Charge Nurse/RN Notified Elmyra Ricks  Date Charge Nurse/RN Notified 11/16/20  Time Charge Nurse/RN Notified 0134  Document  Patient Outcome Other (Comment) (No chnage in patient. Patient resting and does no appear to be in any distress. Will continue to monitor.)  Progress note created (see row info) Yes

## 2020-11-16 NOTE — Progress Notes (Addendum)
PROGRESS NOTE    Eric [name]  [id number] DOB: 1971/01/03 DOA: 11/15/2020 PCP: Practice, [facility]   Brief Narrative: Eric [name] is a 50 y.o. male no known medical history. Patient presented secondary to right groin pain and found to have a retroperitoneal abscess/bowel perforation/cecal mass concerning for neoplasm. Incidental finding of COVID-19 positive status. General surgery consulted on admission.   Assessment & Plan:   Principal Problem:   Sepsis ([place]) Active Problems:   Colonic mass   Iron deficiency anemia due to chronic blood loss   COVID-19 virus infection   Thrombocytosis   Retroperitoneal abscess (HCC)   Hyponatremia   Sepsis Present on admission. Secondary to retroperitoneal abscess. Started on IV zosyn. Blood cultures pending. -Follow up blood cultures  Retroperitoneal abscess Bowel perforation Started on Zosyn IV. General surgery consulted and are following. Likely will need surgery. 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  Thrombocytosis Platelets of 1165k on admission. Likely reactive.  COVID-19 infection Incidental. Asymptomatic. On room air. Started on Remdesivir.  -Continue Remdesivir x3 doses  Iron deficiency anemia Possibly related to colonic mass. Patient with a hemoglobin of 5.4 on admission and given 2 units of PRBC. -Daily CBC  Liver lesion Indeterminate hypodensity with recommendation for liver MRI  Hyponatremia Mild.  Tobacco abuse Smoking cessation discussed. -Continue nicotine    DVT prophylaxis: SCDs Code Status:   Code Status: Full Code Family Communication: None at bedside Disposition Plan: Discharge likely in several days pending general surgery recommendations for management.   Consultants:   General surgery  Procedures:   None  Antimicrobials:  Zosyn IV    Subjective: No issues overnight. Some mild RLQ pain. No nausea or  vomiting. No chest pain, dyspnea or cough.  Objective: Vitals:   11/16/20 0540 11/16/20 0802 11/16/20 0908 11/16/20 1057  BP: 121/87 125/90    Pulse: 100 100 100   Resp: 19 20    Temp: 99.3 F (37.4 C) 98.4 F (36.9 C) 99.2 F (37.3 C) 99.2 F (37.3 C)  TempSrc: Oral Axillary Oral Oral  SpO2: 93% 92%    Weight:      Height:        Intake/Output Summary (Last 24 hours) at 11/16/2020 1254 Last data filed at 11/16/2020 1022 Gross per 24 hour  Intake 2090.98 ml  Output 700 ml  Net 1390.98 ml   Filed Weights   11/15/20 2000  Weight: 69.4 kg    Examination:  General exam: Appears calm and comfortable Respiratory system: Clear to auscultation. Respiratory effort normal. Cardiovascular system: S1 & S2 heard, RRR. No murmurs, rubs, gallops or clicks. Gastrointestinal system: Abdomen is nondistended, soft and mildly tender in RLQ. No organomegaly or masses felt. Normal bowel sounds heard. Central nervous system: Alert and oriented. No focal neurological deficits. Musculoskeletal: No edema. No calf tenderness Skin: No cyanosis. No rashes Psychiatry: Judgement and insight appear normal. Mood & affect appropriate.     Data Reviewed: I have personally reviewed following labs and imaging studies  CBC Lab Results  Component Value Date   WBC 18.8 (H) 11/16/2020   RBC 3.37 (L) 11/16/2020   HGB 7.0 (L) 11/16/2020   HCT 22.6 (L) 11/16/2020   MCV 67.1 (L) 11/16/2020   MCH 20.8 (L) 11/16/2020   PLT 954 (HH) 11/16/2020   MCHC 31.0 11/16/2020   RDW 25.2 (H) 11/16/2020   LYMPHSABS 1.7 11/16/2020   MONOABS 1.2 (H) 11/16/2020   EOSABS 0.0 11/16/2020  BASOSABS 0.1 [id number]     Last metabolic panel Lab Results  Component Value Date   NA 130 (L) 11/16/2020   K 4.1 11/16/2020   CL 99 11/16/2020   CO2 20 (L) 11/16/2020   BUN 13 11/16/2020   CREATININE 0.77 11/16/2020   GLUCOSE 97 11/16/2020   GFRNONAA >60 11/16/2020   CALCIUM 7.8 (L) 11/16/2020   PHOS 4.1 11/16/2020    PROT 6.0 (L) 11/16/2020   ALBUMIN 1.6 (L) 11/16/2020   BILITOT 0.8 11/16/2020   ALKPHOS 83 11/16/2020   AST 12 (L) 11/16/2020   ALT 15 11/16/2020   ANIONGAP 11 11/16/2020    CBG (last 3)  No results for input(s): GLUCAP in the last 72 hours.   GFR: Estimated Creatinine Clearance: 107.2 mL/min (by C-G formula based on SCr of 0.77 mg/dL).  Coagulation Profile: Recent Labs  Lab 11/15/20 1520  INR 1.2    Recent Results (from the past 240 hour(s))  SARS Coronavirus 2 by RT PCR (hospital order, performed in [facility] hospital lab) Nasopharyngeal Nasopharyngeal Swab     Status: Abnormal   Collection Time: 11/15/20 12:41 PM   Specimen: Nasopharyngeal Swab  Result Value Ref Range Status   SARS Coronavirus 2 POSITIVE (A) NEGATIVE Final    Comment: RESULT CALLED TO, READ BACK BY AND VERIFIED WITH: E SHAFFER RN 1352 11/15/20 A BROWNING (NOTE) SARS-CoV-2 target nucleic acids are DETECTED  SARS-CoV-2 RNA is generally detectable in upper respiratory specimens  during the acute phase of infection.  Positive results are indicative  of the presence of the identified virus, but do not rule out bacterial infection or co-infection with other pathogens not detected by the test.  Clinical correlation with patient history and  other diagnostic information is necessary to determine patient infection status.  The expected result is negative.  Fact Sheet for Patients:   StrictlyIdeas.no   Fact Sheet for Healthcare Providers:   BankingDealers.co.za    This test is not yet approved or cleared by the [place] FDA and  has been authorized for detection and/or diagnosis of SARS-CoV-2 by FDA under an Emergency Use Authorization (EUA).  This EUA will remain in effect (meaning this t est can be used) for the duration of  the COVID-19 declaration under Section 564(b)(1) of the Act, 21 U.S.C. section 360-bbb-3(b)(1), unless the authorization  is terminated or revoked sooner.  Performed at [facility], [place] [address] [place], [place] [address]   Blood Culture (routine x 2)     Status: None (Preliminary result)   Collection Time: 11/15/20  1:52 PM   Specimen: BLOOD RIGHT FOREARM  Result Value Ref Range Status   Specimen Description BLOOD RIGHT FOREARM  Final   Special Requests   Final    BOTTLES DRAWN AEROBIC AND ANAEROBIC Blood Culture adequate volume   Culture   Final    NO GROWTH < 24 HOURS Performed at [facility], [place] [address] [place], [place] [address]    Report Status PENDING  Incomplete  Blood Culture (routine x 2)     Status: None (Preliminary result)   Collection Time: 11/15/20  2:13 PM   Specimen: BLOOD LEFT FOREARM  Result Value Ref Range Status   Specimen Description BLOOD LEFT FOREARM  Final   Special Requests   Final    BOTTLES DRAWN AEROBIC AND ANAEROBIC Blood Culture adequate volume   Culture   Final    NO GROWTH < 24 HOURS Performed at [facility], [address], [place], [place] [address]    Report Status PENDING  Incomplete        Radiology Studies: CT Angio Chest PE W and/or Wo Contrast  Result Date: 11/15/2020 CLINICAL DATA:  Positive D-dimer, COVID-19 positive, tobacco abuse, right lower quadrant abdominal pain EXAM: CT ANGIOGRAPHY CHEST CT ABDOMEN AND PELVIS WITH CONTRAST TECHNIQUE: Multidetector CT imaging of the chest was performed using the standard protocol during bolus administration of intravenous contrast. Multiplanar CT image reconstructions and MIPs were obtained to evaluate the vascular anatomy. Multidetector CT imaging of the abdomen and pelvis was performed using the standard protocol during bolus administration of intravenous contrast. CONTRAST:  2mL OMNIPAQUE IOHEXOL 350 MG/ML SOLN COMPARISON:  11/15/2020 FINDINGS: CTA CHEST FINDINGS Cardiovascular: This is a technically adequate evaluation of the pulmonary vasculature. No filling defects or pulmonary  emboli. The heart is unremarkable without pericardial effusion. Mild atherosclerosis within the LAD distribution of the coronary vasculature. Normal caliber of the thoracic aorta without evidence of dissection. Mediastinum/Nodes: Mild wall thickening of the distal thoracic esophagus is nonspecific and could reflect esophagitis. Thyroid and trachea are unremarkable. No pathologic adenopathy. Lungs/Pleura: No acute airspace disease, effusion, or pneumothorax. Mild upper lobe predominant emphysema with areas of subpleural scarring. Central airways are patent. Musculoskeletal: No acute or destructive bony lesions. Reconstructed images demonstrate no additional findings. Review of the MIP images confirms the above findings. CT ABDOMEN and PELVIS FINDINGS Hepatobiliary: There is an indeterminate hypodensity within the inferior aspect right lobe liver, measuring approximately 3.5 x 2.5 cm reference image 36/4. No other focal liver abnormalities. The gallbladder is unremarkable. No intrahepatic duct dilation. Pancreas: Unremarkable. No pancreatic ductal dilatation or surrounding inflammatory changes. Spleen: Normal in size without focal abnormality. Adrenals/Urinary Tract: Adrenal glands are unremarkable. Kidneys are normal, without renal calculi, focal lesion, or hydronephrosis. Bladder is unremarkable. Stomach/Bowel: There is masslike wall thickening involving the cecum, extending approximately 9 cm in length. There is evidence of cecal perforation posteriorly, with a complex multiloculated retroperitoneal fluid collection. There are intramuscular components of the fluid collection within the right psoas and iliacus muscles, with a fluid collection extending into the proximal aspect of the iliopsoas tendon. This collection measures approximately 9.1 x 6.7 cm in transverse dimension, and extends approximately 18 cm in craniocaudal length. Differential diagnosis includes neoplasm or severe colitis. A normal gas-filled  appendix is seen in the right lower quadrant. Vascular/Lymphatic: No discrete adenopathy within the abdomen or pelvis. Moderate atherosclerosis of the aorta. Reproductive: Prostate is unremarkable. Other: There is no free intraperitoneal fluid or free gas. No abdominal wall hernia. Musculoskeletal: No acute or destructive bony lesions. Reconstructed images demonstrate no additional findings. Review of the MIP images confirms the above findings. IMPRESSION: 1. Long segment masslike mural thickening of the cecum, concerning for neoplasm. 2. Posterior perforation at the site of cecal wall thickening, with multilocular right retroperitoneal abscess which extends into the right psoas and right iliacus muscles as above. 3. Indeterminate hypodensity inferior right lobe liver. Dedicated nonemergent outpatient liver MRI could be performed for definitive characterization. 4. No evidence of pulmonary embolus. 5. Mild wall thickening of the distal thoracic esophagus, which could reflect mild esophagitis. 6. Aortic Atherosclerosis (ICD10-I70.0) and Emphysema (ICD10-J43.9). Critical Value/emergent results were called by telephone at the time of interpretation on 11/15/2020 at 4:16 pm to provider [place], who verbally acknowledged these results. Electronically Signed   By: Randa Ngo M.D.   On: 11/15/2020 16:22   CT ABDOMEN PELVIS W CONTRAST  Result Date:  11/15/2020 CLINICAL DATA:  Positive D-dimer, COVID-19 positive, tobacco abuse, right lower quadrant abdominal pain EXAM: CT ANGIOGRAPHY CHEST CT ABDOMEN AND PELVIS WITH CONTRAST TECHNIQUE: Multidetector CT imaging of the chest was performed using the standard protocol during bolus administration of intravenous contrast. Multiplanar CT image reconstructions and MIPs were obtained to evaluate the vascular anatomy. Multidetector CT imaging of the abdomen and pelvis was performed using the standard protocol during bolus administration of intravenous contrast. CONTRAST:   86mL OMNIPAQUE IOHEXOL 350 MG/ML SOLN COMPARISON:  11/15/2020 FINDINGS: CTA CHEST FINDINGS Cardiovascular: This is a technically adequate evaluation of the pulmonary vasculature. No filling defects or pulmonary emboli. The heart is unremarkable without pericardial effusion. Mild atherosclerosis within the LAD distribution of the coronary vasculature. Normal caliber of the thoracic aorta without evidence of dissection. Mediastinum/Nodes: Mild wall thickening of the distal thoracic esophagus is nonspecific and could reflect esophagitis. Thyroid and trachea are unremarkable. No pathologic adenopathy. Lungs/Pleura: No acute airspace disease, effusion, or pneumothorax. Mild upper lobe predominant emphysema with areas of subpleural scarring. Central airways are patent. Musculoskeletal: No acute or destructive bony lesions. Reconstructed images demonstrate no additional findings. Review of the MIP images confirms the above findings. CT ABDOMEN and PELVIS FINDINGS Hepatobiliary: There is an indeterminate hypodensity within the inferior aspect right lobe liver, measuring approximately 3.5 x 2.5 cm reference image 36/4. No other focal liver abnormalities. The gallbladder is unremarkable. No intrahepatic duct dilation. Pancreas: Unremarkable. No pancreatic ductal dilatation or surrounding inflammatory changes. Spleen: Normal in size without focal abnormality. Adrenals/Urinary Tract: Adrenal glands are unremarkable. Kidneys are normal, without renal calculi, focal lesion, or hydronephrosis. Bladder is unremarkable. Stomach/Bowel: There is masslike wall thickening involving the cecum, extending approximately 9 cm in length. There is evidence of cecal perforation posteriorly, with a complex multiloculated retroperitoneal fluid collection. There are intramuscular components of the fluid collection within the right psoas and iliacus muscles, with a fluid collection extending into the proximal aspect of the iliopsoas tendon. This  collection measures approximately 9.1 x 6.7 cm in transverse dimension, and extends approximately 18 cm in craniocaudal length. Differential diagnosis includes neoplasm or severe colitis. A normal gas-filled appendix is seen in the right lower quadrant. Vascular/Lymphatic: No discrete adenopathy within the abdomen or pelvis. Moderate atherosclerosis of the aorta. Reproductive: Prostate is unremarkable. Other: There is no free intraperitoneal fluid or free gas. No abdominal wall hernia. Musculoskeletal: No acute or destructive bony lesions. Reconstructed images demonstrate no additional findings. Review of the MIP images confirms the above findings. IMPRESSION: 1. Long segment masslike mural thickening of the cecum, concerning for neoplasm. 2. Posterior perforation at the site of cecal wall thickening, with multilocular right retroperitoneal abscess which extends into the right psoas and right iliacus muscles as above. 3. Indeterminate hypodensity inferior right lobe liver. Dedicated nonemergent outpatient liver MRI could be performed for definitive characterization. 4. No evidence of pulmonary embolus. 5. Mild wall thickening of the distal thoracic esophagus, which could reflect mild esophagitis. 6. Aortic Atherosclerosis (ICD10-I70.0) and Emphysema (ICD10-J43.9). Critical Value/emergent results were called by telephone at the time of interpretation on 11/15/2020 at 4:16 pm to provider [place], who verbally acknowledged these results. Electronically Signed   By: Randa Ngo M.D.   On: 11/15/2020 16:22   DG Chest Portable 1 View  Result Date: 11/15/2020 CLINICAL DATA:  COVID EXAM: PORTABLE CHEST 1 VIEW COMPARISON:  None. FINDINGS: Evaluation is limited secondary to patient rotation. The cardiomediastinal silhouette is normal in contour. Blunting of the RIGHT costophrenic angle. No pneumothorax.  No acute pleuroparenchymal abnormality. Visualized abdomen is unremarkable. No acute osseous abnormality noted.  IMPRESSION: Blunting of the RIGHT costophrenic angle may reflect a small pleural effusion. Electronically Signed   By: Valentino Saxon MD   On: 11/15/2020 13:27      Scheduled Meds: . nicotine  21 mg Transdermal Daily   Continuous Infusions: . piperacillin-tazobactam (ZOSYN)  IV 3.375 g (11/16/20 9458)  . remdesivir 100 mg in NS 100 mL 100 mg (11/16/20 0930)     LOS: 1 day     Cordelia Poche, MD [facility] 11/16/2020, 12:54 PM  If 7PM-7AM, please contact night-coverage [email or web address]

## 2020-11-16 NOTE — Progress Notes (Signed)
Subjective/Chief Complaint: Complains only of some right thigh discomfort   Objective: Vital signs in last 24 hours: Temp:  [98.4 F (36.9 C)-101 F (38.3 C)] 99.2 F (37.3 C) (02/05 0908) Pulse Rate:  [91-127] 100 (02/05 0908) Resp:  [12-25] 20 (02/05 0802) BP: (108-126)/(75-90) 125/90 (02/05 0802) SpO2:  [92 %-100 %] 92 % (02/05 0802) Weight:  [69.4 kg] 69.4 kg (02/04 2000)    Intake/Output from previous day: 02/04 0701 - 02/05 0700 In: 1596 [Blood:1096; IV Piggyback:500] Out: 350 [Urine:350] Intake/Output this shift: Total I/O In: 495 [I.V.:495] Out: 350 [Urine:350]  General appearance: alert and cooperative Resp: clear to auscultation bilaterally Cardio: regular rate and rhythm GI: soft, minimal RLQ tenderness  Lab Results:  Recent Labs    11/15/20 1246 11/16/20 0653  WBC 21.4* 18.8*  HGB 5.4* 7.0*  HCT 19.2* 22.6*  PLT 1,165* 954*   BMET Recent Labs    11/15/20 1246 11/16/20 0653  NA 129* 130*  K 4.7 4.1  CL 99 99  CO2 20* 20*  GLUCOSE 115* 97  BUN 13 13  CREATININE 0.79 0.77  CALCIUM 8.2* 7.8*   PT/INR Recent Labs    11/15/20 1520  LABPROT 14.5  INR 1.2   ABG No results for input(s): PHART, HCO3 in the last 72 hours.  Invalid input(s): PCO2, PO2  Studies/Results: CT Angio Chest PE W and/or Wo Contrast  Result Date: 11/15/2020 CLINICAL DATA:  Positive D-dimer, COVID-19 positive, tobacco abuse, right lower quadrant abdominal pain EXAM: CT ANGIOGRAPHY CHEST CT ABDOMEN AND PELVIS WITH CONTRAST TECHNIQUE: Multidetector CT imaging of the chest was performed using the standard protocol during bolus administration of intravenous contrast. Multiplanar CT image reconstructions and MIPs were obtained to evaluate the vascular anatomy. Multidetector CT imaging of the abdomen and pelvis was performed using the standard protocol during bolus administration of intravenous contrast. CONTRAST:  21mL OMNIPAQUE IOHEXOL 350 MG/ML SOLN COMPARISON:   11/15/2020 FINDINGS: CTA CHEST FINDINGS Cardiovascular: This is a technically adequate evaluation of the pulmonary vasculature. No filling defects or pulmonary emboli. The heart is unremarkable without pericardial effusion. Mild atherosclerosis within the LAD distribution of the coronary vasculature. Normal caliber of the thoracic aorta without evidence of dissection. Mediastinum/Nodes: Mild wall thickening of the distal thoracic esophagus is nonspecific and could reflect esophagitis. Thyroid and trachea are unremarkable. No pathologic adenopathy. Lungs/Pleura: No acute airspace disease, effusion, or pneumothorax. Mild upper lobe predominant emphysema with areas of subpleural scarring. Central airways are patent. Musculoskeletal: No acute or destructive bony lesions. Reconstructed images demonstrate no additional findings. Review of the MIP images confirms the above findings. CT ABDOMEN and PELVIS FINDINGS Hepatobiliary: There is an indeterminate hypodensity within the inferior aspect right lobe liver, measuring approximately 3.5 x 2.5 cm reference image 36/4. No other focal liver abnormalities. The gallbladder is unremarkable. No intrahepatic duct dilation. Pancreas: Unremarkable. No pancreatic ductal dilatation or surrounding inflammatory changes. Spleen: Normal in size without focal abnormality. Adrenals/Urinary Tract: Adrenal glands are unremarkable. Kidneys are normal, without renal calculi, focal lesion, or hydronephrosis. Bladder is unremarkable. Stomach/Bowel: There is masslike wall thickening involving the cecum, extending approximately 9 cm in length. There is evidence of cecal perforation posteriorly, with a complex multiloculated retroperitoneal fluid collection. There are intramuscular components of the fluid collection within the right psoas and iliacus muscles, with a fluid collection extending into the proximal aspect of the iliopsoas tendon. This collection measures approximately 9.1 x 6.7 cm in  transverse dimension, and extends approximately 18 cm in craniocaudal length. Differential  diagnosis includes neoplasm or severe colitis. A normal gas-filled appendix is seen in the right lower quadrant. Vascular/Lymphatic: No discrete adenopathy within the abdomen or pelvis. Moderate atherosclerosis of the aorta. Reproductive: Prostate is unremarkable. Other: There is no free intraperitoneal fluid or free gas. No abdominal wall hernia. Musculoskeletal: No acute or destructive bony lesions. Reconstructed images demonstrate no additional findings. Review of the MIP images confirms the above findings. IMPRESSION: 1. Long segment masslike mural thickening of the cecum, concerning for neoplasm. 2. Posterior perforation at the site of cecal wall thickening, with multilocular right retroperitoneal abscess which extends into the right psoas and right iliacus muscles as above. 3. Indeterminate hypodensity inferior right lobe liver. Dedicated nonemergent outpatient liver MRI could be performed for definitive characterization. 4. No evidence of pulmonary embolus. 5. Mild wall thickening of the distal thoracic esophagus, which could reflect mild esophagitis. 6. Aortic Atherosclerosis (ICD10-I70.0) and Emphysema (ICD10-J43.9). Critical Value/emergent results were called by telephone at the time of interpretation on 11/15/2020 at 4:16 pm to provider [place], who verbally acknowledged these results. Electronically Signed   By: Randa Ngo M.D.   On: 11/15/2020 16:22   CT ABDOMEN PELVIS W CONTRAST  Result Date: 11/15/2020 CLINICAL DATA:  Positive D-dimer, COVID-19 positive, tobacco abuse, right lower quadrant abdominal pain EXAM: CT ANGIOGRAPHY CHEST CT ABDOMEN AND PELVIS WITH CONTRAST TECHNIQUE: Multidetector CT imaging of the chest was performed using the standard protocol during bolus administration of intravenous contrast. Multiplanar CT image reconstructions and MIPs were obtained to evaluate the vascular anatomy.  Multidetector CT imaging of the abdomen and pelvis was performed using the standard protocol during bolus administration of intravenous contrast. CONTRAST:  39mL OMNIPAQUE IOHEXOL 350 MG/ML SOLN COMPARISON:  11/15/2020 FINDINGS: CTA CHEST FINDINGS Cardiovascular: This is a technically adequate evaluation of the pulmonary vasculature. No filling defects or pulmonary emboli. The heart is unremarkable without pericardial effusion. Mild atherosclerosis within the LAD distribution of the coronary vasculature. Normal caliber of the thoracic aorta without evidence of dissection. Mediastinum/Nodes: Mild wall thickening of the distal thoracic esophagus is nonspecific and could reflect esophagitis. Thyroid and trachea are unremarkable. No pathologic adenopathy. Lungs/Pleura: No acute airspace disease, effusion, or pneumothorax. Mild upper lobe predominant emphysema with areas of subpleural scarring. Central airways are patent. Musculoskeletal: No acute or destructive bony lesions. Reconstructed images demonstrate no additional findings. Review of the MIP images confirms the above findings. CT ABDOMEN and PELVIS FINDINGS Hepatobiliary: There is an indeterminate hypodensity within the inferior aspect right lobe liver, measuring approximately 3.5 x 2.5 cm reference image 36/4. No other focal liver abnormalities. The gallbladder is unremarkable. No intrahepatic duct dilation. Pancreas: Unremarkable. No pancreatic ductal dilatation or surrounding inflammatory changes. Spleen: Normal in size without focal abnormality. Adrenals/Urinary Tract: Adrenal glands are unremarkable. Kidneys are normal, without renal calculi, focal lesion, or hydronephrosis. Bladder is unremarkable. Stomach/Bowel: There is masslike wall thickening involving the cecum, extending approximately 9 cm in length. There is evidence of cecal perforation posteriorly, with a complex multiloculated retroperitoneal fluid collection. There are intramuscular components of  the fluid collection within the right psoas and iliacus muscles, with a fluid collection extending into the proximal aspect of the iliopsoas tendon. This collection measures approximately 9.1 x 6.7 cm in transverse dimension, and extends approximately 18 cm in craniocaudal length. Differential diagnosis includes neoplasm or severe colitis. A normal gas-filled appendix is seen in the right lower quadrant. Vascular/Lymphatic: No discrete adenopathy within the abdomen or pelvis. Moderate atherosclerosis of the aorta. Reproductive: Prostate is unremarkable. Other:  There is no free intraperitoneal fluid or free gas. No abdominal wall hernia. Musculoskeletal: No acute or destructive bony lesions. Reconstructed images demonstrate no additional findings. Review of the MIP images confirms the above findings. IMPRESSION: 1. Long segment masslike mural thickening of the cecum, concerning for neoplasm. 2. Posterior perforation at the site of cecal wall thickening, with multilocular right retroperitoneal abscess which extends into the right psoas and right iliacus muscles as above. 3. Indeterminate hypodensity inferior right lobe liver. Dedicated nonemergent outpatient liver MRI could be performed for definitive characterization. 4. No evidence of pulmonary embolus. 5. Mild wall thickening of the distal thoracic esophagus, which could reflect mild esophagitis. 6. Aortic Atherosclerosis (ICD10-I70.0) and Emphysema (ICD10-J43.9). Critical Value/emergent results were called by telephone at the time of interpretation on 11/15/2020 at 4:16 pm to provider [place], who verbally acknowledged these results. Electronically Signed   By: Randa Ngo M.D.   On: 11/15/2020 16:22   DG Chest Portable 1 View  Result Date: 11/15/2020 CLINICAL DATA:  COVID EXAM: PORTABLE CHEST 1 VIEW COMPARISON:  None. FINDINGS: Evaluation is limited secondary to patient rotation. The cardiomediastinal silhouette is normal in contour. Blunting of the  RIGHT costophrenic angle. No pneumothorax. No acute pleuroparenchymal abnormality. Visualized abdomen is unremarkable. No acute osseous abnormality noted. IMPRESSION: Blunting of the RIGHT costophrenic angle may reflect a small pleural effusion. Electronically Signed   By: Valentino Saxon MD   On: 11/15/2020 13:27    Anti-infectives: Anti-infectives (From admission, onward)   Start     Dose/Rate Route Frequency Ordered Stop   11/16/20 1000  remdesivir 100 mg in sodium chloride 0.9 % 100 mL IVPB       "Followed by" Linked Group Details   100 mg 200 mL/hr over 30 Minutes Intravenous Daily 11/15/20 2135 11/18/20 0959   11/15/20 2230  piperacillin-tazobactam (ZOSYN) IVPB 3.375 g        3.375 g 12.5 mL/hr over 240 Minutes Intravenous Every 8 hours 11/15/20 2144     11/15/20 2134  remdesivir 200 mg in sodium chloride 0.9% 250 mL IVPB       "Followed by" Linked Group Details   200 mg 580 mL/hr over 30 Minutes Intravenous Once 11/15/20 2135 11/16/20 0029   11/15/20 1700  piperacillin-tazobactam (ZOSYN) IVPB 3.375 g        3.375 g 100 mL/hr over 30 Minutes Intravenous  Once 11/15/20 1657 11/15/20 1826      Assessment/Plan: s/p * No surgery found * right colon perf into retroperitoneum  Continue IV abx Could be managed in the short term with drains and IV abx. Eventually will need right colectomy. Question whether covid status should direct [place] in one direction over another. Will follow closely  LOS: 1 day    Autumn Messing III 11/16/2020

## 2020-11-16 NOTE — Progress Notes (Signed)
Tried to call pt's sister Mickel Baas 3 x's with the phone number on file and it stated that the "[place] phone number was not in service."

## 2020-11-16 NOTE — Progress Notes (Signed)
Initial Nutrition Assessment  DOCUMENTATION CODES:   Not applicable  INTERVENTION:  Once diet advanced -Ensure Enlive po BID, each supplement provides 350 kcal and 20 grams of protein  -MVI   NUTRITION DIAGNOSIS:   Increased nutrient needs related to acute illness,catabolic illness (sepsis secondary to retroperitoneal abscess; COVID-19 virus infection) as evidenced by estimated needs.    GOAL:   Patient will meet greater than or equal to 90% of their needs    MONITOR:   Labs,I & O's,Diet advancement,Skin,PO intake,Weight trends,Supplement acceptance  REASON FOR ASSESSMENT:   Malnutrition Screening Tool    ASSESSMENT:  50 year old male with history significant for tobacco abuse admitted with sepsis due to right retroperitoneal abscess and found to be positive for COVID-19. Pt who has not had any routine medical care for the last 10 years presents with 5 day onset of significant right groin pain that has been progressive and constant associated with chills and night sweats.  RD working remotely.  Pt with right colon per into retroperitoneum. Surgery following, no surgical interventions at this time. Plans for short term management with drains and IV abx.   Spoke with pt via phone this morning. He  reports doing okay, denies shortness of breath, loss of taste/smell, decreased appetite, nausea or vomiting, says he is hungry. Pt endorses good appetite at baseline. He usually eats lunch and dinner meals, recalls rigatoni, chinese food, and stuffed peppers. Pt endorses ~15 lbs unintentional weight loss, recalls usual wt 165-170 lbs about 3 months ago. No weight history for review. He is ~12 lbs (7.5%) under usual weight which is significant for time frame. Will plan to complete exam at follow-up. RD educated on increased needs secondary to COVID infection and the importance of adequate nutrition. Pt is agreeable to drinking chocolate flavored Ensure with diet advancement.   Liver  lesion seen on CT imaging. Noted consideration of nonemergent MRI for definitive characterization.  Medications reviewed and include: Zosyn, Remdesivir  Labs: Na 130 (L), WBC 18.8 (H), Hgb 7 (L), HCT 22.6 (L)  NUTRITION - FOCUSED PHYSICAL EXAM:  Unable to complete at this time  Diet Order:   Diet Order            Diet NPO time specified  Diet effective now                 EDUCATION NEEDS:   Education needs have been addressed  Skin:  Skin Assessment: Reviewed RN Assessment  Last BM:  pta  Height:   Ht Readings from Last 1 Encounters:  11/15/20 6' (1.829 m)    Weight:   Wt Readings from Last 1 Encounters:  11/15/20 69.4 kg    BMI:  Body mass index is 20.75 kg/m.  Estimated Nutritional Needs:   Kcal:  [id number]  Protein:  110-125  Fluid:  >/= 2.2 L    Lajuan Lines, RD, LDN Clinical Nutrition After Hours/Weekend Pager # in [place]

## 2020-11-17 LAB — BPAM RBC
Blood Product Expiration Date: 202203022359
Blood Product Expiration Date: 202203032359
ISSUE DATE / TIME: 202202041635
ISSUE DATE / TIME: 202202050110
Unit Type and Rh: 6200
Unit Type and Rh: 6200

## 2020-11-17 LAB — CBC WITH DIFFERENTIAL/PLATELET
Abs Immature Granulocytes: 0.33 10*3/uL — ABNORMAL HIGH (ref 0.00–0.07)
Basophils Absolute: 0.1 10*3/uL (ref 0.0–0.1)
Basophils Relative: 0 %
Eosinophils Absolute: 0 10*3/uL (ref 0.0–0.5)
Eosinophils Relative: 0 %
HCT: 23.7 % — ABNORMAL LOW (ref 39.0–52.0)
Hemoglobin: 7.7 g/dL — ABNORMAL LOW (ref 13.0–17.0)
Immature Granulocytes: 2 %
Lymphocytes Relative: 7 %
Lymphs Abs: 1.5 10*3/uL (ref 0.7–4.0)
MCH: 21.5 pg — ABNORMAL LOW (ref 26.0–34.0)
MCHC: 32.5 g/dL (ref 30.0–36.0)
MCV: 66.2 fL — ABNORMAL LOW (ref 80.0–100.0)
Monocytes Absolute: 1.2 10*3/uL — ABNORMAL HIGH (ref 0.1–1.0)
Monocytes Relative: 6 %
Neutro Abs: 18.2 10*3/uL — ABNORMAL HIGH (ref 1.7–7.7)
Neutrophils Relative %: 85 %
Platelets: 895 10*3/uL — ABNORMAL HIGH (ref 150–400)
RBC: 3.58 MIL/uL — ABNORMAL LOW (ref 4.22–5.81)
RDW: 26.2 % — ABNORMAL HIGH (ref 11.5–15.5)
WBC: 21.3 10*3/uL — ABNORMAL HIGH (ref 4.0–10.5)
nRBC: 0 % (ref 0.0–0.2)

## 2020-11-17 LAB — FERRITIN: Ferritin: 156 ng/mL (ref 24–336)

## 2020-11-17 LAB — COMPREHENSIVE METABOLIC PANEL
ALT: 14 U/L (ref 0–44)
AST: 12 U/L — ABNORMAL LOW (ref 15–41)
Albumin: 1.6 g/dL — ABNORMAL LOW (ref 3.5–5.0)
Alkaline Phosphatase: 81 U/L (ref 38–126)
Anion gap: 9 (ref 5–15)
BUN: 13 mg/dL (ref 6–20)
CO2: 22 mmol/L (ref 22–32)
Calcium: 8 mg/dL — ABNORMAL LOW (ref 8.9–10.3)
Chloride: 97 mmol/L — ABNORMAL LOW (ref 98–111)
Creatinine, Ser: 0.75 mg/dL (ref 0.61–1.24)
GFR, Estimated: >60 mL/min (ref >60)
Glucose, Bld: 106 mg/dL — ABNORMAL HIGH (ref 70–99)
Potassium: 4 mmol/L (ref 3.5–5.1)
Sodium: 128 mmol/L — ABNORMAL LOW (ref 135–145)
Total Bilirubin: 0.6 mg/dL (ref 0.3–1.2)
Total Protein: 6.1 g/dL — ABNORMAL LOW (ref 6.5–8.1)

## 2020-11-17 LAB — TYPE AND SCREEN
ABO/RH(D): A POS
Antibody Screen: NEGATIVE
Unit division: 0
Unit division: 0

## 2020-11-17 LAB — PHOSPHORUS: Phosphorus: 3.8 mg/dL (ref 2.5–4.6)

## 2020-11-17 LAB — SODIUM, URINE, RANDOM: Sodium, Ur: 148 mmol/L

## 2020-11-17 LAB — PROCALCITONIN: Procalcitonin: 24.19 ng/mL

## 2020-11-17 LAB — D-DIMER, QUANTITATIVE: D-Dimer, Quant: 4.58 ug/mL-FEU — ABNORMAL HIGH (ref 0.00–0.50)

## 2020-11-17 LAB — PREALBUMIN: Prealbumin: 6.7 mg/dL — ABNORMAL LOW (ref 18–38)

## 2020-11-17 LAB — CREATININE, URINE, RANDOM: Creatinine, Urine: 144.83 mg/dL

## 2020-11-17 LAB — MAGNESIUM: Magnesium: 2 mg/dL (ref 1.7–2.4)

## 2020-11-17 LAB — C-REACTIVE PROTEIN: CRP: 18.9 mg/dL — ABNORMAL HIGH (ref <1.0)

## 2020-11-17 MED ORDER — ENSURE ENLIVE PO LIQD
237.0000 mL | Freq: Three times a day (TID) | ORAL | Status: DC
  Administered 2020-11-17 – 2020-11-22 (×14): 237 mL via ORAL
  Filled 2020-11-17: qty 237

## 2020-11-17 MED ORDER — MELATONIN 5 MG PO TABS
5.0000 mg | ORAL_TABLET | Freq: Every evening | ORAL | Status: DC | PRN
  Administered 2020-11-17 – 2020-11-30 (×7): 5 mg via ORAL
  Filled 2020-11-17 (×8): qty 1

## 2020-11-17 NOTE — Progress Notes (Addendum)
Subjective/Chief Complaint: Complains only of some right groin soreness. Wbc increased    Objective: Vital signs in last 24 hours: Temp:  [98.6 F (37 C)-100.8 F (38.2 C)] 98.9 F (37.2 C) (02/06 0733) Pulse Rate:  [96-100] 100 (02/06 0733) Resp:  [16-20] 18 (02/06 0733) BP: (116-129)/(82-90) 116/84 (02/06 0733) SpO2:  [93 %-96 %] 94 % (02/06 0733)    Intake/Output from previous day: 02/05 0701 - 02/06 0700 In: 695 [P.O.:100; I.V.:495; IV Piggyback:100] Out: 1300 [Urine:1300] Intake/Output this shift: No intake/output data recorded.  General appearance: alert and cooperative Resp: clear to auscultation bilaterally Cardio: regular rate and rhythm GI: soft, mild RLQ tenderness  Lab Results:  Recent Labs    11/16/20 0653 11/17/20 0402  WBC 18.8* 21.3*  HGB 7.0* 7.7*  HCT 22.6* 23.7*  PLT 954* 895*   BMET Recent Labs    11/16/20 0653 11/17/20 0402  NA 130* 128*  K 4.1 4.0  CL 99 97*  CO2 20* 22  GLUCOSE 97 106*  BUN 13 13  CREATININE 0.77 0.75  CALCIUM 7.8* 8.0*   PT/INR Recent Labs    11/15/20 1520  LABPROT 14.5  INR 1.2   ABG No results for input(s): PHART, HCO3 in the last 72 hours.  Invalid input(s): PCO2, PO2  Studies/Results: CT Angio Chest PE W and/or Wo Contrast  Result Date: 11/15/2020 CLINICAL DATA:  Positive D-dimer, COVID-19 positive, tobacco abuse, right lower quadrant abdominal pain EXAM: CT ANGIOGRAPHY CHEST CT ABDOMEN AND PELVIS WITH CONTRAST TECHNIQUE: Multidetector CT imaging of the chest was performed using the standard protocol during bolus administration of intravenous contrast. Multiplanar CT image reconstructions and MIPs were obtained to evaluate the vascular anatomy. Multidetector CT imaging of the abdomen and pelvis was performed using the standard protocol during bolus administration of intravenous contrast. CONTRAST:  62mL OMNIPAQUE IOHEXOL 350 MG/ML SOLN COMPARISON:  11/15/2020 FINDINGS: CTA CHEST FINDINGS  Cardiovascular: This is a technically adequate evaluation of the pulmonary vasculature. No filling defects or pulmonary emboli. The heart is unremarkable without pericardial effusion. Mild atherosclerosis within the LAD distribution of the coronary vasculature. Normal caliber of the thoracic aorta without evidence of dissection. Mediastinum/Nodes: Mild wall thickening of the distal thoracic esophagus is nonspecific and could reflect esophagitis. Thyroid and trachea are unremarkable. No pathologic adenopathy. Lungs/Pleura: No acute airspace disease, effusion, or pneumothorax. Mild upper lobe predominant emphysema with areas of subpleural scarring. Central airways are patent. Musculoskeletal: No acute or destructive bony lesions. Reconstructed images demonstrate no additional findings. Review of the MIP images confirms the above findings. CT ABDOMEN and PELVIS FINDINGS Hepatobiliary: There is an indeterminate hypodensity within the inferior aspect right lobe liver, measuring approximately 3.5 x 2.5 cm reference image 36/4. No other focal liver abnormalities. The gallbladder is unremarkable. No intrahepatic duct dilation. Pancreas: Unremarkable. No pancreatic ductal dilatation or surrounding inflammatory changes. Spleen: Normal in size without focal abnormality. Adrenals/Urinary Tract: Adrenal glands are unremarkable. Kidneys are normal, without renal calculi, focal lesion, or hydronephrosis. Bladder is unremarkable. Stomach/Bowel: There is masslike wall thickening involving the cecum, extending approximately 9 cm in length. There is evidence of cecal perforation posteriorly, with a complex multiloculated retroperitoneal fluid collection. There are intramuscular components of the fluid collection within the right psoas and iliacus muscles, with a fluid collection extending into the proximal aspect of the iliopsoas tendon. This collection measures approximately 9.1 x 6.7 cm in transverse dimension, and extends  approximately 18 cm in craniocaudal length. Differential diagnosis includes neoplasm or severe colitis. A normal  gas-filled appendix is seen in the right lower quadrant. Vascular/Lymphatic: No discrete adenopathy within the abdomen or pelvis. Moderate atherosclerosis of the aorta. Reproductive: Prostate is unremarkable. Other: There is no free intraperitoneal fluid or free gas. No abdominal wall hernia. Musculoskeletal: No acute or destructive bony lesions. Reconstructed images demonstrate no additional findings. Review of the MIP images confirms the above findings. IMPRESSION: 1. Long segment masslike mural thickening of the cecum, concerning for neoplasm. 2. Posterior perforation at the site of cecal wall thickening, with multilocular right retroperitoneal abscess which extends into the right psoas and right iliacus muscles as above. 3. Indeterminate hypodensity inferior right lobe liver. Dedicated nonemergent outpatient liver MRI could be performed for definitive characterization. 4. No evidence of pulmonary embolus. 5. Mild wall thickening of the distal thoracic esophagus, which could reflect mild esophagitis. 6. Aortic Atherosclerosis (ICD10-I70.0) and Emphysema (ICD10-J43.9). Critical Value/emergent results were called by telephone at the time of interpretation on 11/15/2020 at 4:16 pm to provider [place], who verbally acknowledged these results. Electronically Signed   By: Randa Ngo M.D.   On: 11/15/2020 16:22   CT ABDOMEN PELVIS W CONTRAST  Result Date: 11/15/2020 CLINICAL DATA:  Positive D-dimer, COVID-19 positive, tobacco abuse, right lower quadrant abdominal pain EXAM: CT ANGIOGRAPHY CHEST CT ABDOMEN AND PELVIS WITH CONTRAST TECHNIQUE: Multidetector CT imaging of the chest was performed using the standard protocol during bolus administration of intravenous contrast. Multiplanar CT image reconstructions and MIPs were obtained to evaluate the vascular anatomy. Multidetector CT imaging of the  abdomen and pelvis was performed using the standard protocol during bolus administration of intravenous contrast. CONTRAST:  72mL OMNIPAQUE IOHEXOL 350 MG/ML SOLN COMPARISON:  11/15/2020 FINDINGS: CTA CHEST FINDINGS Cardiovascular: This is a technically adequate evaluation of the pulmonary vasculature. No filling defects or pulmonary emboli. The heart is unremarkable without pericardial effusion. Mild atherosclerosis within the LAD distribution of the coronary vasculature. Normal caliber of the thoracic aorta without evidence of dissection. Mediastinum/Nodes: Mild wall thickening of the distal thoracic esophagus is nonspecific and could reflect esophagitis. Thyroid and trachea are unremarkable. No pathologic adenopathy. Lungs/Pleura: No acute airspace disease, effusion, or pneumothorax. Mild upper lobe predominant emphysema with areas of subpleural scarring. Central airways are patent. Musculoskeletal: No acute or destructive bony lesions. Reconstructed images demonstrate no additional findings. Review of the MIP images confirms the above findings. CT ABDOMEN and PELVIS FINDINGS Hepatobiliary: There is an indeterminate hypodensity within the inferior aspect right lobe liver, measuring approximately 3.5 x 2.5 cm reference image 36/4. No other focal liver abnormalities. The gallbladder is unremarkable. No intrahepatic duct dilation. Pancreas: Unremarkable. No pancreatic ductal dilatation or surrounding inflammatory changes. Spleen: Normal in size without focal abnormality. Adrenals/Urinary Tract: Adrenal glands are unremarkable. Kidneys are normal, without renal calculi, focal lesion, or hydronephrosis. Bladder is unremarkable. Stomach/Bowel: There is masslike wall thickening involving the cecum, extending approximately 9 cm in length. There is evidence of cecal perforation posteriorly, with a complex multiloculated retroperitoneal fluid collection. There are intramuscular components of the fluid collection within the  right psoas and iliacus muscles, with a fluid collection extending into the proximal aspect of the iliopsoas tendon. This collection measures approximately 9.1 x 6.7 cm in transverse dimension, and extends approximately 18 cm in craniocaudal length. Differential diagnosis includes neoplasm or severe colitis. A normal gas-filled appendix is seen in the right lower quadrant. Vascular/Lymphatic: No discrete adenopathy within the abdomen or pelvis. Moderate atherosclerosis of the aorta. Reproductive: Prostate is unremarkable. Other: There is no free intraperitoneal fluid or free  gas. No abdominal wall hernia. Musculoskeletal: No acute or destructive bony lesions. Reconstructed images demonstrate no additional findings. Review of the MIP images confirms the above findings. IMPRESSION: 1. Long segment masslike mural thickening of the cecum, concerning for neoplasm. 2. Posterior perforation at the site of cecal wall thickening, with multilocular right retroperitoneal abscess which extends into the right psoas and right iliacus muscles as above. 3. Indeterminate hypodensity inferior right lobe liver. Dedicated nonemergent outpatient liver MRI could be performed for definitive characterization. 4. No evidence of pulmonary embolus. 5. Mild wall thickening of the distal thoracic esophagus, which could reflect mild esophagitis. 6. Aortic Atherosclerosis (ICD10-I70.0) and Emphysema (ICD10-J43.9). Critical Value/emergent results were called by telephone at the time of interpretation on 11/15/2020 at 4:16 pm to provider [place], who verbally acknowledged these results. Electronically Signed   By: Randa Ngo M.D.   On: 11/15/2020 16:22   DG Chest Portable 1 View  Result Date: 11/15/2020 CLINICAL DATA:  COVID EXAM: PORTABLE CHEST 1 VIEW COMPARISON:  None. FINDINGS: Evaluation is limited secondary to patient rotation. The cardiomediastinal silhouette is normal in contour. Blunting of the RIGHT costophrenic angle. No  pneumothorax. No acute pleuroparenchymal abnormality. Visualized abdomen is unremarkable. No acute osseous abnormality noted. IMPRESSION: Blunting of the RIGHT costophrenic angle may reflect a small pleural effusion. Electronically Signed   By: Valentino Saxon MD   On: 11/15/2020 13:27    Anti-infectives: Anti-infectives (From admission, onward)   Start     Dose/Rate Route Frequency Ordered Stop   11/16/20 1000  remdesivir 100 mg in sodium chloride 0.9 % 100 mL IVPB       "Followed by" Linked Group Details   100 mg 200 mL/hr over 30 Minutes Intravenous Daily 11/15/20 2135 11/18/20 0959   11/15/20 2230  piperacillin-tazobactam (ZOSYN) IVPB 3.375 g        3.375 g 12.5 mL/hr over 240 Minutes Intravenous Every 8 hours 11/15/20 2144     11/15/20 2134  remdesivir 200 mg in sodium chloride 0.9% 250 mL IVPB       "Followed by" Linked Group Details   200 mg 580 mL/hr over 30 Minutes Intravenous Once 11/15/20 2135 11/16/20 0029   11/15/20 1700  piperacillin-tazobactam (ZOSYN) IVPB 3.375 g        3.375 g 100 mL/hr over 30 Minutes Intravenous  Once 11/15/20 1657 11/15/20 1826      Assessment/Plan: s/p * No surgery found * ensure today then npo after midnight  Perforated right colon into retroperitoneum. Luckily he is not appearing sick from it. He will likely need right colectomy early this week. Given his level of malnutrition he will likely need ileostomy and drain. I have discussed with him the risks and benefits of the surgery and he understands and is also in favor of surgery this week Covid + but assymptomatic Continue IV abx Medicine will need to delineate level of risk, ie cardiac and pulmonary for surgery  LOS: 2 days    Autumn Messing III 11/17/2020

## 2020-11-17 NOTE — Progress Notes (Signed)
PROGRESS NOTE    Eric [name]  [id number] DOB: 23-Dec-1970 DOA: 11/15/2020 PCP: [facility], [facility]   Brief Narrative: Eric [name] is a 50 y.o. male no known medical history. Patient presented secondary to right groin pain and found to have a retroperitoneal abscess/bowel perforation/cecal mass concerning for neoplasm. Incidental finding of COVID-19 positive status. General surgery consulted on admission.   Assessment & Plan:   Principal Problem:   Sepsis ([place]) Active Problems:   Colonic mass   Iron deficiency anemia due to chronic blood loss   COVID-19 virus infection   Thrombocytosis   Retroperitoneal abscess (HCC)   Hyponatremia   Sepsis Present on admission. Secondary to retroperitoneal abscess. Started on IV zosyn. Blood cultures no growth to date. -Follow up blood cultures -Trend procalcitonin  Retroperitoneal abscess Bowel perforation Started on Zosyn IV. General surgery consulted and are following. Lyndel Safe cardiac risk score of 0.2% and pulmonary score for pneumonia of 0.4%. Patient has no known cardiac/pulmonary disease. -General surgery recommendations: antibiotics, plan for operative management  Colonic mass Concern for neoplasm in setting of above.  Thrombocytosis Platelets of 1165k on admission. Likely reactive. Improving.  COVID-19 infection Incidental. Asymptomatic. On room air. Started on Remdesivir.  -Continue Remdesivir x3 doses  Leukocytosis Secondary to above infection/perforation. Trended up slightly  Iron deficiency anemia Possibly related to colonic mass. Patient with a hemoglobin of 5.4 on admission and given 2 units of PRBC. Hemoglobin stable. -Daily CBC  Liver lesion Indeterminate hypodensity with recommendation for liver MRI  Hyponatremia Mild.  Tobacco abuse Smoking cessation discussed. -Continue nicotine    DVT prophylaxis: SCDs Code Status:   Code Status: Full Code Family Communication: None at  bedside Disposition Plan: Discharge likely in several days pending general surgery recommendations for management.   Consultants:   General surgery  Procedures:   None  Antimicrobials:  Zosyn IV    Subjective: Abdominal pain is mild-moderate. No other concerns.  Objective: Vitals:   11/17/20 0007 11/17/20 0356 11/17/20 0733 11/17/20 0800  BP: 128/82 123/86 116/84 119/83  Pulse: 100 96 100 98  Resp: 16 20 18    Temp: 98.6 F (37 C) 98.8 F (37.1 C) 98.9 F (37.2 C)   TempSrc: Oral Oral Oral   SpO2: 93% 94% 94% 94%  Weight:      Height:        Intake/Output Summary (Last 24 hours) at 11/17/2020 1234 Last data filed at 11/17/2020 1000 Gross per 24 hour  Intake 436.99 ml  Output 800 ml  Net -363.01 ml   Filed Weights   11/15/20 2000  Weight: 69.4 kg    Examination:  General exam: Appears calm and comfortable Respiratory system: Clear to auscultation. Respiratory effort normal. Cardiovascular system: S1 & S2 heard, RRR. No murmurs, rubs, gallops or clicks. Gastrointestinal system: Abdomen is nondistended, soft and tender in RUQ/RLQ. No organomegaly or masses felt. Normal bowel sounds heard. Central nervous system: Alert and oriented. No focal neurological deficits. Musculoskeletal: No edema. No calf tenderness Skin: No cyanosis. No rashes Psychiatry: Judgement and insight appear normal. Mood & affect appropriate.     Data Reviewed: I have personally reviewed following labs and imaging studies  CBC Lab Results  Component Value Date   WBC 21.3 (H) 11/17/2020   RBC 3.58 (L) 11/17/2020   HGB 7.7 (L) 11/17/2020   HCT 23.7 (L) 11/17/2020   MCV 66.2 (L) 11/17/2020   MCH 21.5 (L) 11/17/2020   PLT 895 (H) 11/17/2020   MCHC 32.5 11/17/2020  RDW 26.2 (H) 11/17/2020   LYMPHSABS 1.5 11/17/2020   MONOABS 1.2 (H) 11/17/2020   EOSABS 0.0 11/17/2020   BASOSABS 0.1 0000000     Last metabolic panel Lab Results  Component Value Date   NA 128 (L) 11/17/2020    K 4.0 11/17/2020   CL 97 (L) 11/17/2020   CO2 22 11/17/2020   BUN 13 11/17/2020   CREATININE 0.75 11/17/2020   GLUCOSE 106 (H) 11/17/2020   GFRNONAA >60 11/17/2020   CALCIUM 8.0 (L) 11/17/2020   PHOS 3.8 11/17/2020   PROT 6.1 (L) 11/17/2020   ALBUMIN 1.6 (L) 11/17/2020   BILITOT 0.6 11/17/2020   ALKPHOS 81 11/17/2020   AST 12 (L) 11/17/2020   ALT 14 11/17/2020   ANIONGAP 9 11/17/2020    CBG (last 3)  No results for input(s): GLUCAP in the last 72 hours.   GFR: Estimated Creatinine Clearance: 107.2 mL/min (by C-G formula based on SCr of 0.75 mg/dL).  Coagulation Profile: Recent Labs  Lab 11/15/20 1520  INR 1.2    Recent Results (from the past 240 hour(s))  SARS Coronavirus 2 by RT PCR (hospital order, performed in [facility] hospital lab) Nasopharyngeal Nasopharyngeal Swab     Status: Abnormal   Collection Time: 11/15/20 12:41 PM   Specimen: Nasopharyngeal Swab  Result Value Ref Range Status   SARS Coronavirus 2 POSITIVE (A) NEGATIVE Final    Comment: RESULT CALLED TO, READ BACK BY AND VERIFIED WITH: E SHAFFER RN 1352 11/15/20 A BROWNING (NOTE) SARS-CoV-2 target nucleic acids are DETECTED  SARS-CoV-2 RNA is generally detectable in upper respiratory specimens  during the acute phase of infection.  Positive results are indicative  of the presence of the identified virus, but do not rule out bacterial infection or co-infection with other pathogens not detected by the test.  Clinical correlation with patient history and  other diagnostic information is necessary to determine patient infection status.  The expected result is negative.  Fact Sheet for Patients:   StrictlyIdeas.no   Fact Sheet for Healthcare Providers:   BankingDealers.co.za    This test is not yet approved or cleared by the [place] FDA and  has been authorized for detection and/or diagnosis of SARS-CoV-2 by FDA under an Emergency Use  Authorization (EUA).  This EUA will remain in effect (meaning this t est can be used) for the duration of  the COVID-19 declaration under Section 564(b)(1) of the Act, 21 U.S.C. section 360-bbb-3(b)(1), unless the authorization is terminated or revoked sooner.  Performed at [facility], [place] [address], [place], [place] [address]   Blood Culture (routine x 2)     Status: None (Preliminary result)   Collection Time: 11/15/20  1:52 PM   Specimen: BLOOD RIGHT FOREARM  Result Value Ref Range Status   Specimen Description BLOOD RIGHT FOREARM  Final   Special Requests   Final    BOTTLES DRAWN AEROBIC AND ANAEROBIC Blood Culture adequate volume   Culture   Final    NO GROWTH 1 DAY Performed at [facility], [place] [address], [place], [place] [address]    Report Status PENDING  Incomplete  Blood Culture (routine x 2)     Status: None (Preliminary result)   Collection Time: 11/15/20  2:13 PM   Specimen: BLOOD LEFT FOREARM  Result Value Ref Range Status   Specimen Description BLOOD LEFT FOREARM  Final   Special Requests   Final    BOTTLES DRAWN AEROBIC AND ANAEROBIC Blood  Culture adequate volume   Culture   Final    NO GROWTH 1 DAY Performed at [facility], [place] [address] [place],  [address]    Report Status PENDING  Incomplete        Radiology Studies: CT Angio Chest PE W and/or Wo Contrast  Result Date: 11/15/2020 CLINICAL DATA:  Positive D-dimer, COVID-19 positive, tobacco abuse, right lower quadrant abdominal pain EXAM: CT ANGIOGRAPHY CHEST CT ABDOMEN AND PELVIS WITH CONTRAST TECHNIQUE: Multidetector CT imaging of the chest was performed using the standard protocol during bolus administration of intravenous contrast. Multiplanar CT image reconstructions and MIPs were obtained to evaluate the vascular anatomy. Multidetector CT imaging of the abdomen and pelvis was performed using the standard protocol during bolus administration of intravenous contrast.  CONTRAST:  41mL OMNIPAQUE IOHEXOL 350 MG/ML SOLN COMPARISON:  11/15/2020 FINDINGS: CTA CHEST FINDINGS Cardiovascular: This is a technically adequate evaluation of the pulmonary vasculature. No filling defects or pulmonary emboli. The heart is unremarkable without pericardial effusion. Mild atherosclerosis within the LAD distribution of the coronary vasculature. Normal caliber of the thoracic aorta without evidence of dissection. Mediastinum/Nodes: Mild wall thickening of the distal thoracic esophagus is nonspecific and could reflect esophagitis. Thyroid and trachea are unremarkable. No pathologic adenopathy. Lungs/Pleura: No acute airspace disease, effusion, or pneumothorax. Mild upper lobe predominant emphysema with areas of subpleural scarring. Central airways are patent. Musculoskeletal: No acute or destructive bony lesions. Reconstructed images demonstrate no additional findings. Review of the MIP images confirms the above findings. CT ABDOMEN and PELVIS FINDINGS Hepatobiliary: There is an indeterminate hypodensity within the inferior aspect right lobe liver, measuring approximately 3.5 x 2.5 cm reference image 36/4. No other focal liver abnormalities. The gallbladder is unremarkable. No intrahepatic duct dilation. Pancreas: Unremarkable. No pancreatic ductal dilatation or surrounding inflammatory changes. Spleen: Normal in size without focal abnormality. Adrenals/Urinary Tract: Adrenal glands are unremarkable. Kidneys are normal, without renal calculi, focal lesion, or hydronephrosis. Bladder is unremarkable. Stomach/Bowel: There is masslike wall thickening involving the cecum, extending approximately 9 cm in length. There is evidence of cecal perforation posteriorly, with a complex multiloculated retroperitoneal fluid collection. There are intramuscular components of the fluid collection within the right psoas and iliacus muscles, with a fluid collection extending into the proximal aspect of the iliopsoas  tendon. This collection measures approximately 9.1 x 6.7 cm in transverse dimension, and extends approximately 18 cm in craniocaudal length. Differential diagnosis includes neoplasm or severe colitis. A normal gas-filled appendix is seen in the right lower quadrant. Vascular/Lymphatic: No discrete adenopathy within the abdomen or pelvis. Moderate atherosclerosis of the aorta. Reproductive: Prostate is unremarkable. Other: There is no free intraperitoneal fluid or free gas. No abdominal wall hernia. Musculoskeletal: No acute or destructive bony lesions. Reconstructed images demonstrate no additional findings. Review of the MIP images confirms the above findings. IMPRESSION: 1. Long segment masslike mural thickening of the cecum, concerning for neoplasm. 2. Posterior perforation at the site of cecal wall thickening, with multilocular right retroperitoneal abscess which extends into the right psoas and right iliacus muscles as above. 3. Indeterminate hypodensity inferior right lobe liver. Dedicated nonemergent outpatient liver MRI could be performed for definitive characterization. 4. No evidence of pulmonary embolus. 5. Mild wall thickening of the distal thoracic esophagus, which could reflect mild esophagitis. 6. Aortic Atherosclerosis (ICD10-I70.0) and Emphysema (ICD10-J43.9). Critical Value/emergent results were called by telephone at the time of interpretation on 11/15/2020 at 4:16 pm to provider [place], who verbally acknowledged these results. Electronically Signed  By: Randa Ngo M.D.   On: 11/15/2020 16:22   CT ABDOMEN PELVIS W CONTRAST  Result Date: 11/15/2020 CLINICAL DATA:  Positive D-dimer, COVID-19 positive, tobacco abuse, right lower quadrant abdominal pain EXAM: CT ANGIOGRAPHY CHEST CT ABDOMEN AND PELVIS WITH CONTRAST TECHNIQUE: Multidetector CT imaging of the chest was performed using the standard protocol during bolus administration of intravenous contrast. Multiplanar CT image  reconstructions and MIPs were obtained to evaluate the vascular anatomy. Multidetector CT imaging of the abdomen and pelvis was performed using the standard protocol during bolus administration of intravenous contrast. CONTRAST:  26mL OMNIPAQUE IOHEXOL 350 MG/ML SOLN COMPARISON:  11/15/2020 FINDINGS: CTA CHEST FINDINGS Cardiovascular: This is a technically adequate evaluation of the pulmonary vasculature. No filling defects or pulmonary emboli. The heart is unremarkable without pericardial effusion. Mild atherosclerosis within the LAD distribution of the coronary vasculature. Normal caliber of the thoracic aorta without evidence of dissection. Mediastinum/Nodes: Mild wall thickening of the distal thoracic esophagus is nonspecific and could reflect esophagitis. Thyroid and trachea are unremarkable. No pathologic adenopathy. Lungs/Pleura: No acute airspace disease, effusion, or pneumothorax. Mild upper lobe predominant emphysema with areas of subpleural scarring. Central airways are patent. Musculoskeletal: No acute or destructive bony lesions. Reconstructed images demonstrate no additional findings. Review of the MIP images confirms the above findings. CT ABDOMEN and PELVIS FINDINGS Hepatobiliary: There is an indeterminate hypodensity within the inferior aspect right lobe liver, measuring approximately 3.5 x 2.5 cm reference image 36/4. No other focal liver abnormalities. The gallbladder is unremarkable. No intrahepatic duct dilation. Pancreas: Unremarkable. No pancreatic ductal dilatation or surrounding inflammatory changes. Spleen: Normal in size without focal abnormality. Adrenals/Urinary Tract: Adrenal glands are unremarkable. Kidneys are normal, without renal calculi, focal lesion, or hydronephrosis. Bladder is unremarkable. Stomach/Bowel: There is masslike wall thickening involving the cecum, extending approximately 9 cm in length. There is evidence of cecal perforation posteriorly, with a complex  multiloculated retroperitoneal fluid collection. There are intramuscular components of the fluid collection within the right psoas and iliacus muscles, with a fluid collection extending into the proximal aspect of the iliopsoas tendon. This collection measures approximately 9.1 x 6.7 cm in transverse dimension, and extends approximately 18 cm in craniocaudal length. Differential diagnosis includes neoplasm or severe colitis. A normal gas-filled appendix is seen in the right lower quadrant. Vascular/Lymphatic: No discrete adenopathy within the abdomen or pelvis. Moderate atherosclerosis of the aorta. Reproductive: Prostate is unremarkable. Other: There is no free intraperitoneal fluid or free gas. No abdominal wall hernia. Musculoskeletal: No acute or destructive bony lesions. Reconstructed images demonstrate no additional findings. Review of the MIP images confirms the above findings. IMPRESSION: 1. Long segment masslike mural thickening of the cecum, concerning for neoplasm. 2. Posterior perforation at the site of cecal wall thickening, with multilocular right retroperitoneal abscess which extends into the right psoas and right iliacus muscles as above. 3. Indeterminate hypodensity inferior right lobe liver. Dedicated nonemergent outpatient liver MRI could be performed for definitive characterization. 4. No evidence of pulmonary embolus. 5. Mild wall thickening of the distal thoracic esophagus, which could reflect mild esophagitis. 6. Aortic Atherosclerosis (ICD10-I70.0) and Emphysema (ICD10-J43.9). Critical Value/emergent results were called by telephone at the time of interpretation on 11/15/2020 at 4:16 pm to provider [place], who verbally acknowledged these results. Electronically Signed   By: Randa Ngo M.D.   On: 11/15/2020 16:22   DG Chest Portable 1 View  Result Date: 11/15/2020 CLINICAL DATA:  COVID EXAM: PORTABLE CHEST 1 VIEW COMPARISON:  None. FINDINGS: Evaluation is  limited secondary to  patient rotation. The cardiomediastinal silhouette is normal in contour. Blunting of the RIGHT costophrenic angle. No pneumothorax. No acute pleuroparenchymal abnormality. Visualized abdomen is unremarkable. No acute osseous abnormality noted. IMPRESSION: Blunting of the RIGHT costophrenic angle may reflect a small pleural effusion. Electronically Signed   By: Valentino Saxon MD   On: 11/15/2020 13:27      Scheduled Meds: . feeding supplement  237 mL Oral TID  . influenza vac split quadrivalent PF  0.5 mL Intramuscular Tomorrow-1000  . nicotine  21 mg Transdermal Daily   Continuous Infusions: . piperacillin-tazobactam (ZOSYN)  IV 12.5 mL/hr at 11/17/20 1000     LOS: 2 days     Cordelia Poche, MD [facility] 11/17/2020, 12:34 PM  If 7PM-7AM, please contact night-coverage [email or web address]

## 2020-11-18 ENCOUNTER — Inpatient Hospital Stay: Payer: Self-pay

## 2020-11-18 ENCOUNTER — Inpatient Hospital Stay (HOSPITAL_COMMUNITY): Payer: Self-pay

## 2020-11-18 ENCOUNTER — Encounter (HOSPITAL_COMMUNITY): Payer: Self-pay | Admitting: Internal Medicine

## 2020-11-18 DIAGNOSIS — R5383 Other fatigue: Secondary | ICD-10-CM

## 2020-11-18 DIAGNOSIS — Z9989 Dependence on other enabling machines and devices: Secondary | ICD-10-CM

## 2020-11-18 DIAGNOSIS — Z720 Tobacco use: Secondary | ICD-10-CM

## 2020-11-18 DIAGNOSIS — Z515 Encounter for palliative care: Secondary | ICD-10-CM

## 2020-11-18 DIAGNOSIS — Z7189 Other specified counseling: Secondary | ICD-10-CM

## 2020-11-18 DIAGNOSIS — R1031 Right lower quadrant pain: Secondary | ICD-10-CM

## 2020-11-18 DIAGNOSIS — D649 Anemia, unspecified: Secondary | ICD-10-CM

## 2020-11-18 DIAGNOSIS — E43 Unspecified severe protein-calorie malnutrition: Secondary | ICD-10-CM

## 2020-11-18 DIAGNOSIS — R160 Hepatomegaly, not elsewhere classified: Secondary | ICD-10-CM

## 2020-11-18 LAB — CBC WITH DIFFERENTIAL/PLATELET
Abs Immature Granulocytes: 0.32 10*3/uL — ABNORMAL HIGH (ref 0.00–0.07)
Basophils Absolute: 0.1 10*3/uL (ref 0.0–0.1)
Basophils Relative: 0 %
Eosinophils Absolute: 0 10*3/uL (ref 0.0–0.5)
Eosinophils Relative: 0 %
HCT: 25 % — ABNORMAL LOW (ref 39.0–52.0)
Hemoglobin: 7.6 g/dL — ABNORMAL LOW (ref 13.0–17.0)
Immature Granulocytes: 2 %
Lymphocytes Relative: 8 %
Lymphs Abs: 1.6 10*3/uL (ref 0.7–4.0)
MCH: 20.7 pg — ABNORMAL LOW (ref 26.0–34.0)
MCHC: 30.4 g/dL (ref 30.0–36.0)
MCV: 67.9 fL — ABNORMAL LOW (ref 80.0–100.0)
Monocytes Absolute: 1.3 10*3/uL — ABNORMAL HIGH (ref 0.1–1.0)
Monocytes Relative: 6 %
Neutro Abs: 17.5 10*3/uL — ABNORMAL HIGH (ref 1.7–7.7)
Neutrophils Relative %: 84 %
Platelets: 818 10*3/uL — ABNORMAL HIGH (ref 150–400)
RBC: 3.68 MIL/uL — ABNORMAL LOW (ref 4.22–5.81)
RDW: 27.2 % — ABNORMAL HIGH (ref 11.5–15.5)
WBC: 20.8 10*3/uL — ABNORMAL HIGH (ref 4.0–10.5)
nRBC: 0 % (ref 0.0–0.2)

## 2020-11-18 LAB — COMPREHENSIVE METABOLIC PANEL
ALT: 14 U/L (ref 0–44)
AST: 14 U/L — ABNORMAL LOW (ref 15–41)
Albumin: 1.5 g/dL — ABNORMAL LOW (ref 3.5–5.0)
Alkaline Phosphatase: 71 U/L (ref 38–126)
Anion gap: 8 (ref 5–15)
BUN: 11 mg/dL (ref 6–20)
CO2: 22 mmol/L (ref 22–32)
Calcium: 7.8 mg/dL — ABNORMAL LOW (ref 8.9–10.3)
Chloride: 94 mmol/L — ABNORMAL LOW (ref 98–111)
Creatinine, Ser: 0.77 mg/dL (ref 0.61–1.24)
GFR, Estimated: >60 mL/min (ref >60)
Glucose, Bld: 123 mg/dL — ABNORMAL HIGH (ref 70–99)
Potassium: 3.8 mmol/L (ref 3.5–5.1)
Sodium: 124 mmol/L — ABNORMAL LOW (ref 135–145)
Total Bilirubin: 0.4 mg/dL (ref 0.3–1.2)
Total Protein: 5.8 g/dL — ABNORMAL LOW (ref 6.5–8.1)

## 2020-11-18 LAB — D-DIMER, QUANTITATIVE: D-Dimer, Quant: 3.53 ug/mL-FEU — ABNORMAL HIGH (ref 0.00–0.50)

## 2020-11-18 LAB — MAGNESIUM: Magnesium: 1.9 mg/dL (ref 1.7–2.4)

## 2020-11-18 LAB — OSMOLALITY, URINE: Osmolality, Ur: 409 mOsm/kg (ref 300–900)

## 2020-11-18 LAB — PHOSPHORUS: Phosphorus: 3.5 mg/dL (ref 2.5–4.6)

## 2020-11-18 LAB — OSMOLALITY: Osmolality: 262 mOsm/kg — ABNORMAL LOW (ref 275–295)

## 2020-11-18 LAB — PREALBUMIN: Prealbumin: 5.7 mg/dL — ABNORMAL LOW (ref 18–38)

## 2020-11-18 LAB — PATHOLOGIST SMEAR REVIEW

## 2020-11-18 LAB — PROCALCITONIN: Procalcitonin: 11.1 ng/mL

## 2020-11-18 LAB — C-REACTIVE PROTEIN: CRP: 20.2 mg/dL — ABNORMAL HIGH (ref <1.0)

## 2020-11-18 LAB — TSH: TSH: 3.642 u[IU]/mL (ref 0.350–4.500)

## 2020-11-18 LAB — GLUCOSE, CAPILLARY
Glucose-Capillary: 113 mg/dL — ABNORMAL HIGH (ref 70–99)
Glucose-Capillary: 95 mg/dL (ref 70–99)
Glucose-Capillary: 170 mg/dL — ABNORMAL HIGH (ref 70–99)

## 2020-11-18 LAB — SODIUM: Sodium: 128 mmol/L — ABNORMAL LOW (ref 135–145)

## 2020-11-18 LAB — FERRITIN: Ferritin: 137 ng/mL (ref 24–336)

## 2020-11-18 MED ORDER — DIPHENHYDRAMINE HCL 50 MG/ML IJ SOLN
12.5000 mg | Freq: Four times a day (QID) | INTRAMUSCULAR | Status: DC | PRN
  Administered 2020-12-02 – 2020-12-04 (×2): 25 mg via INTRAVENOUS
  Administered 2020-12-05 – 2020-12-07 (×2): 12.5 mg via INTRAVENOUS
  Administered 2020-12-08 – 2020-12-14 (×7): 25 mg via INTRAVENOUS
  Filled 2020-11-18 (×11): qty 1

## 2020-11-18 MED ORDER — LIP MEDEX EX OINT
1.0000 "application " | TOPICAL_OINTMENT | Freq: Two times a day (BID) | CUTANEOUS | Status: DC
  Administered 2020-11-18 – 2020-11-30 (×25): 1 via TOPICAL
  Filled 2020-11-18 (×2): qty 7

## 2020-11-18 MED ORDER — CHLORHEXIDINE GLUCONATE CLOTH 2 % EX PADS
6.0000 | MEDICATED_PAD | Freq: Every day | CUTANEOUS | Status: DC
  Administered 2020-11-19 – 2020-12-14 (×24): 6 via TOPICAL

## 2020-11-18 MED ORDER — PHENOL 1.4 % MT LIQD
2.0000 | OROMUCOSAL | Status: DC | PRN
  Administered 2020-12-08 (×2): 2 via OROMUCOSAL
  Filled 2020-11-18: qty 177

## 2020-11-18 MED ORDER — ALUM & MAG HYDROXIDE-SIMETH 200-200-20 MG/5ML PO SUSP
30.0000 mL | Freq: Four times a day (QID) | ORAL | Status: DC | PRN
  Administered 2020-11-24 – 2020-11-30 (×5): 30 mL via ORAL
  Filled 2020-11-18 (×5): qty 30

## 2020-11-18 MED ORDER — SODIUM CHLORIDE 0.9 % IV SOLN
25.0000 mg | Freq: Once | INTRAVENOUS | Status: AC
  Administered 2020-11-18: 25 mg via INTRAVENOUS
  Filled 2020-11-18: qty 0.5

## 2020-11-18 MED ORDER — MAGIC MOUTHWASH
15.0000 mL | Freq: Four times a day (QID) | ORAL | Status: DC | PRN
  Filled 2020-11-18: qty 15

## 2020-11-18 MED ORDER — MIDAZOLAM HCL 2 MG/2ML IJ SOLN
INTRAMUSCULAR | Status: AC | PRN
  Administered 2020-11-18: 1 mg via INTRAVENOUS

## 2020-11-18 MED ORDER — SODIUM CHLORIDE 0.9 % IV SOLN: INTRAVENOUS | Status: DC

## 2020-11-18 MED ORDER — SODIUM CHLORIDE 0.9% FLUSH
10.0000 mL | INTRAVENOUS | Status: DC | PRN
  Administered 2020-11-24: 10 mL
  Administered 2020-11-27: 20 mL
  Administered 2020-12-02 – 2020-12-05 (×2): 10 mL
  Administered 2020-12-09: 20 mL

## 2020-11-18 MED ORDER — LIDOCAINE HCL 1 % IJ SOLN
INTRAMUSCULAR | Status: AC
  Filled 2020-11-18: qty 20

## 2020-11-18 MED ORDER — MIDAZOLAM HCL 2 MG/2ML IJ SOLN
INTRAMUSCULAR | Status: AC
  Filled 2020-11-18: qty 2

## 2020-11-18 MED ORDER — FENTANYL CITRATE (PF) 100 MCG/2ML IJ SOLN
INTRAMUSCULAR | Status: AC
  Filled 2020-11-18: qty 2

## 2020-11-18 MED ORDER — TAB-A-VITE/IRON PO TABS: 1.0000 | ORAL_TABLET | Freq: Every day | ORAL | Status: DC

## 2020-11-18 MED ORDER — POLYETHYLENE GLYCOL 3350 17 G PO PACK
17.0000 g | PACK | Freq: Every day | ORAL | Status: DC
  Administered 2020-11-19: 17 g via ORAL
  Filled 2020-11-18 (×2): qty 1

## 2020-11-18 MED ORDER — PROCHLORPERAZINE EDISYLATE 10 MG/2ML IJ SOLN
5.0000 mg | INTRAMUSCULAR | Status: DC | PRN
  Administered 2020-11-30 (×2): 10 mg via INTRAVENOUS
  Administered 2020-12-01: 5 mg via INTRAVENOUS
  Administered 2020-12-01 – 2020-12-03 (×2): 10 mg via INTRAVENOUS
  Administered 2020-12-06 – 2020-12-08 (×2): 5 mg via INTRAVENOUS
  Filled 2020-11-18 (×7): qty 2

## 2020-11-18 MED ORDER — METHOCARBAMOL 1000 MG/10ML IJ SOLN
1000.0000 mg | Freq: Four times a day (QID) | INTRAVENOUS | Status: DC | PRN
  Filled 2020-11-18: qty 10

## 2020-11-18 MED ORDER — INSULIN ASPART 100 UNIT/ML ~~LOC~~ SOLN
0.0000 [IU] | SUBCUTANEOUS | Status: DC
  Administered 2020-11-18 – 2020-11-19 (×3): 3 [IU] via SUBCUTANEOUS
  Administered 2020-11-19 – 2020-11-20 (×4): 2 [IU] via SUBCUTANEOUS
  Administered 2020-11-20: 3 [IU] via SUBCUTANEOUS
  Administered 2020-11-20: 2 [IU] via SUBCUTANEOUS
  Administered 2020-11-21: 3 [IU] via SUBCUTANEOUS
  Administered 2020-11-21 (×2): 2 [IU] via SUBCUTANEOUS
  Administered 2020-11-21: 3 [IU] via SUBCUTANEOUS
  Administered 2020-11-21 – 2020-11-22 (×6): 2 [IU] via SUBCUTANEOUS
  Administered 2020-11-22: 3 [IU] via SUBCUTANEOUS
  Administered 2020-11-22: 2 [IU] via SUBCUTANEOUS
  Administered 2020-11-22: 3 [IU] via SUBCUTANEOUS
  Administered 2020-11-23: 2 [IU] via SUBCUTANEOUS

## 2020-11-18 MED ORDER — FENTANYL CITRATE (PF) 100 MCG/2ML IJ SOLN
INTRAMUSCULAR | Status: AC | PRN
  Administered 2020-11-18: 25 ug via INTRAVENOUS

## 2020-11-18 MED ORDER — SODIUM CHLORIDE 0.9% FLUSH
5.0000 mL | Freq: Three times a day (TID) | INTRAVENOUS | Status: DC
  Administered 2020-11-18 – 2020-12-15 (×46): 5 mL

## 2020-11-18 MED ORDER — LACTATED RINGERS IV BOLUS: 1000.0000 mL | Freq: Three times a day (TID) | INTRAVENOUS | Status: AC | PRN

## 2020-11-18 MED ORDER — SIMETHICONE 40 MG/0.6ML PO SUSP
40.0000 mg | Freq: Four times a day (QID) | ORAL | Status: DC | PRN
  Administered 2020-11-24 – 2020-11-25 (×4): 40 mg via ORAL
  Filled 2020-11-18 (×7): qty 0.6

## 2020-11-18 MED ORDER — MENTHOL 3 MG MT LOZG: 1.0000 | LOZENGE | OROMUCOSAL | Status: DC | PRN

## 2020-11-18 MED ORDER — SODIUM CHLORIDE 0.9 % IV SOLN
500.0000 mg | Freq: Once | INTRAVENOUS | Status: AC
  Administered 2020-11-18: 500 mg via INTRAVENOUS
  Filled 2020-11-18: qty 10

## 2020-11-18 MED ORDER — SODIUM CHLORIDE 0.9% FLUSH
10.0000 mL | Freq: Two times a day (BID) | INTRAVENOUS | Status: DC
  Administered 2020-11-18 – 2020-11-30 (×20): 10 mL

## 2020-11-18 MED ORDER — TRAVASOL 10 % IV SOLN
INTRAVENOUS | Status: AC
  Filled 2020-11-18: qty 568.1

## 2020-11-18 NOTE — Consult Note (Signed)
Chief Complaint: Retroperitoneal Abscess. Request is for abscess drain placement  Referring Physician(s):  Dr. Alfonzo BeersA Lovick  Supervising Physician: Gilmer MorWagner, Jaime  [facility]  History of Present Illness: Eric PulleyFrank R [name] is a 50 y.o. male Smoker, no know medical history. [facility] ED [facility] with right groin pain found to have a retroperitoneal abscess, anemia and incidental finding of COVID + CT abd pelvis from 2.4.22 reads Posterior perforation at the site of cecal wall thickening, with multilocular right retroperitoneal abscess which extends into the right psoas and right iliacus muscles as above.Possible bowel perforated bowel concerning for possible neoplasm. Team is requesting a retroperitoneal abscess drain placement.  Patient alert and laying in bed. Denies any fevers, headache, chest pain, SOB, cough, nausea, vomiting or bleeding. Patient endorses RLQ pain that is persistent in nature. Made better by morphine. Return precautions and treatment recommendations and follow-up discussed with the patient who is agreeable with the plan.    History reviewed. No pertinent past medical history.  History reviewed. No pertinent surgical history.  Allergies: Patient has no known allergies.  Medications: Prior to Admission medications   Medication Sig Start Date End Date Taking? Authorizing Provider  acetaminophen (TYLENOL) 500 MG tablet Take 500 mg by mouth every 6 (six) hours as needed.   Yes [provider]  Calcium Carbonate Antacid (ALKA-SELTZER ANTACID PO) Take 1 tablet by mouth daily as needed (For pain).   Yes [provider]  famotidine (PEPCID) 20 MG tablet Take 20 mg by mouth 2 (two) times daily.   Yes [provider]     Family History  Problem Relation Age of Onset  . Diabetes Sister     Social History   Socioeconomic History  . Marital status: Single    Spouse name: Not on file  . Number of children: Not on file  .  Years of education: Not on file  . Highest education level: Not on file  Occupational History  . Not on file  Tobacco Use  . Smoking status: Current Every Day Smoker    Packs/day: 0.50    Years: 30.00    Pack years: 15.00    Types: Cigarettes  . Smokeless tobacco: Never Used  . Tobacco comment: Up to 1.5 PPD since age 50  Substance and Sexual Activity  . Alcohol use: Not Currently  . Drug use: Yes    Types: Marijuana  . Sexual activity: Not on file  Other Topics Concern  . Not on file  Social History Narrative  . Not on file   Social Determinants of Health   Financial Resource Strain: Not on file  Food Insecurity: Not on file  Transportation Needs: Not on file  Physical Activity: Not on file  Stress: Not on file  Social Connections: Not on file    Review of Systems: A 12 point ROS discussed and pertinent positives are indicated in the HPI above.  All other systems are negative.  Review of Systems  Constitutional: Negative for fever.  HENT: Negative for congestion.   Respiratory: Negative for cough and shortness of breath.   Cardiovascular: Negative for chest pain.  Gastrointestinal: Negative for abdominal pain ( RLQ), diarrhea, nausea and vomiting.  Neurological: Negative for headaches.  Psychiatric/Behavioral: Negative for behavioral problems and confusion.    Vital Signs: BP 113/84 (BP Location: Left Arm)   Pulse 92   Temp 99.5 F (37.5 C) (Oral)   Resp 17   Ht 6' (1.829 m)  Wt 153 lb (69.4 kg)   SpO2 93%   BMI 20.75 kg/m   Physical Exam Vitals and nursing note reviewed.  Constitutional:      Appearance: He is well-developed and well-nourished.  HENT:     Head: Normocephalic.  Cardiovascular:     Rate and Rhythm: Normal rate and regular rhythm.     Heart sounds: Normal heart sounds.  Pulmonary:     Effort: Pulmonary effort is normal.     Breath sounds: Normal breath sounds.  Musculoskeletal:        General: Normal range of motion.     Cervical  back: Normal range of motion.  Skin:    General: Skin is dry.  Neurological:     Mental Status: He is alert and oriented to person, place, and time.  Psychiatric:        Mood and Affect: Mood and affect normal.     Imaging: CT Angio Chest PE W and/or Wo Contrast  Result Date: 11/15/2020 CLINICAL DATA:  Positive D-dimer, COVID-19 positive, tobacco abuse, right lower quadrant abdominal pain EXAM: CT ANGIOGRAPHY CHEST CT ABDOMEN AND PELVIS WITH CONTRAST TECHNIQUE: Multidetector CT imaging of the chest was performed using the standard protocol during bolus administration of intravenous contrast. Multiplanar CT image reconstructions and MIPs were obtained to evaluate the vascular anatomy. Multidetector CT imaging of the abdomen and pelvis was performed using the standard protocol during bolus administration of intravenous contrast. CONTRAST:  80mL OMNIPAQUE IOHEXOL 350 MG/ML SOLN COMPARISON:  11/15/2020 FINDINGS: CTA CHEST FINDINGS Cardiovascular: This is a technically adequate evaluation of the pulmonary vasculature. No filling defects or pulmonary emboli. The heart is unremarkable without pericardial effusion. Mild atherosclerosis within the LAD distribution of the coronary vasculature. Normal caliber of the thoracic aorta without evidence of dissection. Mediastinum/Nodes: Mild wall thickening of the distal thoracic esophagus is nonspecific and could reflect esophagitis. Thyroid and trachea are unremarkable. No pathologic adenopathy. Lungs/Pleura: No acute airspace disease, effusion, or pneumothorax. Mild upper lobe predominant emphysema with areas of subpleural scarring. Central airways are patent. Musculoskeletal: No acute or destructive bony lesions. Reconstructed images demonstrate no additional findings. Review of the MIP images confirms the above findings. CT ABDOMEN and PELVIS FINDINGS Hepatobiliary: There is an indeterminate hypodensity within the inferior aspect right lobe liver, measuring  approximately 3.5 x 2.5 cm reference image 36/4. No other focal liver abnormalities. The gallbladder is unremarkable. No intrahepatic duct dilation. Pancreas: Unremarkable. No pancreatic ductal dilatation or surrounding inflammatory changes. Spleen: Normal in size without focal abnormality. Adrenals/Urinary Tract: Adrenal glands are unremarkable. Kidneys are normal, without renal calculi, focal lesion, or hydronephrosis. Bladder is unremarkable. Stomach/Bowel: There is masslike wall thickening involving the cecum, extending approximately 9 cm in length. There is evidence of cecal perforation posteriorly, with a complex multiloculated retroperitoneal fluid collection. There are intramuscular components of the fluid collection within the right psoas and iliacus muscles, with a fluid collection extending into the proximal aspect of the iliopsoas tendon. This collection measures approximately 9.1 x 6.7 cm in transverse dimension, and extends approximately 18 cm in craniocaudal length. Differential diagnosis includes neoplasm or severe colitis. A normal gas-filled appendix is seen in the right lower quadrant. Vascular/Lymphatic: No discrete adenopathy within the abdomen or pelvis. Moderate atherosclerosis of the aorta. Reproductive: Prostate is unremarkable. Other: There is no free intraperitoneal fluid or free gas. No abdominal wall hernia. Musculoskeletal: No acute or destructive bony lesions. Reconstructed images demonstrate no additional findings. Review of the MIP images confirms the above findings. IMPRESSION:  1. Long segment masslike mural thickening of the cecum, concerning for neoplasm. 2. Posterior perforation at the site of cecal wall thickening, with multilocular right retroperitoneal abscess which extends into the right psoas and right iliacus muscles as above. 3. Indeterminate hypodensity inferior right lobe liver. Dedicated nonemergent outpatient liver MRI could be performed for definitive  characterization. 4. No evidence of pulmonary embolus. 5. Mild wall thickening of the distal thoracic esophagus, which could reflect mild esophagitis. 6. Aortic Atherosclerosis (ICD10-I70.0) and Emphysema (ICD10-J43.9). Critical Value/emergent results were called by telephone at the time of interpretation on 11/15/2020 at 4:16 pm to provider PA Lanora Manis, who verbally acknowledged these results. Electronically Signed   By: Sharlet Salina M.D.   On: 11/15/2020 16:22   CT ABDOMEN PELVIS W CONTRAST  Result Date: 11/15/2020 CLINICAL DATA:  Positive D-dimer, COVID-19 positive, tobacco abuse, right lower quadrant abdominal pain EXAM: CT ANGIOGRAPHY CHEST CT ABDOMEN AND PELVIS WITH CONTRAST TECHNIQUE: Multidetector CT imaging of the chest was performed using the standard protocol during bolus administration of intravenous contrast. Multiplanar CT image reconstructions and MIPs were obtained to evaluate the vascular anatomy. Multidetector CT imaging of the abdomen and pelvis was performed using the standard protocol during bolus administration of intravenous contrast. CONTRAST:  42mL OMNIPAQUE IOHEXOL 350 MG/ML SOLN COMPARISON:  11/15/2020 FINDINGS: CTA CHEST FINDINGS Cardiovascular: This is a technically adequate evaluation of the pulmonary vasculature. No filling defects or pulmonary emboli. The heart is unremarkable without pericardial effusion. Mild atherosclerosis within the LAD distribution of the coronary vasculature. Normal caliber of the thoracic aorta without evidence of dissection. Mediastinum/Nodes: Mild wall thickening of the distal thoracic esophagus is nonspecific and could reflect esophagitis. Thyroid and trachea are unremarkable. No pathologic adenopathy. Lungs/Pleura: No acute airspace disease, effusion, or pneumothorax. Mild upper lobe predominant emphysema with areas of subpleural scarring. Central airways are patent. Musculoskeletal: No acute or destructive bony lesions. Reconstructed images  demonstrate no additional findings. Review of the MIP images confirms the above findings. CT ABDOMEN and PELVIS FINDINGS Hepatobiliary: There is an indeterminate hypodensity within the inferior aspect right lobe liver, measuring approximately 3.5 x 2.5 cm reference image 36/4. No other focal liver abnormalities. The gallbladder is unremarkable. No intrahepatic duct dilation. Pancreas: Unremarkable. No pancreatic ductal dilatation or surrounding inflammatory changes. Spleen: Normal in size without focal abnormality. Adrenals/Urinary Tract: Adrenal glands are unremarkable. Kidneys are normal, without renal calculi, focal lesion, or hydronephrosis. Bladder is unremarkable. Stomach/Bowel: There is masslike wall thickening involving the cecum, extending approximately 9 cm in length. There is evidence of cecal perforation posteriorly, with a complex multiloculated retroperitoneal fluid collection. There are intramuscular components of the fluid collection within the right psoas and iliacus muscles, with a fluid collection extending into the proximal aspect of the iliopsoas tendon. This collection measures approximately 9.1 x 6.7 cm in transverse dimension, and extends approximately 18 cm in craniocaudal length. Differential diagnosis includes neoplasm or severe colitis. A normal gas-filled appendix is seen in the right lower quadrant. Vascular/Lymphatic: No discrete adenopathy within the abdomen or pelvis. Moderate atherosclerosis of the aorta. Reproductive: Prostate is unremarkable. Other: There is no free intraperitoneal fluid or free gas. No abdominal wall hernia. Musculoskeletal: No acute or destructive bony lesions. Reconstructed images demonstrate no additional findings. Review of the MIP images confirms the above findings. IMPRESSION: 1. Long segment masslike mural thickening of the cecum, concerning for neoplasm. 2. Posterior perforation at the site of cecal wall thickening, with multilocular right retroperitoneal  abscess which extends into the right psoas and right  iliacus muscles as above. 3. Indeterminate hypodensity inferior right lobe liver. Dedicated nonemergent outpatient liver MRI could be performed for definitive characterization. 4. No evidence of pulmonary embolus. 5. Mild wall thickening of the distal thoracic esophagus, which could reflect mild esophagitis. 6. Aortic Atherosclerosis (ICD10-I70.0) and Emphysema (ICD10-J43.9). Critical Value/emergent results were called by telephone at the time of interpretation on 11/15/2020 at 4:16 pm to provider [place], who verbally acknowledged these results. Electronically Signed   By: Randa Ngo M.D.   On: 11/15/2020 16:22   DG Chest Portable 1 View  Result Date: 11/15/2020 CLINICAL DATA:  COVID EXAM: PORTABLE CHEST 1 VIEW COMPARISON:  None. FINDINGS: Evaluation is limited secondary to patient rotation. The cardiomediastinal silhouette is normal in contour. Blunting of the RIGHT costophrenic angle. No pneumothorax. No acute pleuroparenchymal abnormality. Visualized abdomen is unremarkable. No acute osseous abnormality noted. IMPRESSION: Blunting of the RIGHT costophrenic angle may reflect a small pleural effusion. Electronically Signed   By: Valentino Saxon MD   On: 11/15/2020 13:27   [place] EKG SITE RITE  Result Date: 11/18/2020 If Site Rite image not attached, placement could not be confirmed due to current cardiac rhythm.   Labs:  CBC: Recent Labs    11/15/20 1246 11/16/20 0653 11/17/20 0402 11/18/20 0408  WBC 21.4* 18.8* 21.3* 20.8*  HGB 5.4* 7.0* 7.7* 7.6*  HCT 19.2* 22.6* 23.7* 25.0*  PLT 1,165* 954* 895* 818*    COAGS: Recent Labs    11/15/20 1520  INR 1.2    BMP: Recent Labs    11/15/20 1246 11/16/20 0653 11/17/20 0402 11/18/20 0408  NA 129* 130* 128* 124*  K 4.7 4.1 4.0 3.8  CL 99 99 97* 94*  CO2 20* 20* 22 22  GLUCOSE 115* 97 106* 123*  BUN 13 13 13 11   CALCIUM 8.2* 7.8* 8.0* 7.8*  CREATININE 0.79 0.77 0.75 0.77   GFRNONAA >60 >60 >60 >60    LIVER FUNCTION TESTS: Recent Labs    11/15/20 1246 11/16/20 0653 11/17/20 0402 11/18/20 0408  BILITOT 0.2* 0.8 0.6 0.4  AST 14* 12* 12* 14*  ALT 13 15 14 14   ALKPHOS 93 83 81 71  PROT 6.6 6.0* 6.1* 5.8*  ALBUMIN 1.9* 1.6* 1.6* 1.5*    Assessment and Plan:  50 y.o. male inpatient. Smoker, no know medical history. Presented to the ED at [facility] with right groin pain found to have a retroperitoneal abscess, anemia and incidental finding of COVID + CT abd pelvis from 2.4.22 reads Posterior perforation at the site of cecal wall thickening, with multilocular right retroperitoneal abscess which extends into the right psoas and right iliacus muscles as above.Possible bowel perforated bowel concerning for possible neoplasm. Team is requesting a retroperitoneal abscess drain placement.  Patient is being followed by surgery who is planing on a right sided colectomy this week if he is medically cleared. Hgb 7.6 post 2 units of PRBC. WBC is 20.8 (uptrending) AST 14, CRP 20.2, Sodium 124. Blood cultures negative x 3 days. All other labs and medications are within acceptable parameters.   Risks and benefits discussed with the patient including bleeding, infection, damage to adjacent structures, bowel perforation/fistula connection, and sepsis.  All of the patient's questions were answered, patient is agreeable to proceed. Consent signed and in chart.   Thank you for this interesting consult.  I greatly enjoyed meeting Eric [name] and look forward to participating in their care.  A copy of this report was sent to the requesting provider on  this date.  Electronically Signed: Jacqualine Mau, NP 11/18/2020, 12:30 PM   I spent a total of 40 Minutes    in face to face in clinical consultation, greater than 50% of which was counseling/coordinating care for retroperitoneal  abscess drain placement.

## 2020-11-18 NOTE — Progress Notes (Signed)
Peripherally Inserted Central Catheter Placement  The IV Nurse has discussed with the patient and/or persons authorized to consent for the patient, the purpose of this procedure and the potential benefits and risks involved with this procedure.  The benefits include less needle sticks, lab draws from the catheter, and the patient may be discharged home with the catheter. Risks include, but not limited to, infection, bleeding, blood clot (thrombus formation), and puncture of an artery; nerve damage and irregular heartbeat and possibility to perform a PICC exchange if needed/ordered by physician.  Alternatives to this procedure were also discussed.  Bard Power PICC patient education guide, fact sheet on infection prevention and patient information card has been provided to patient /or left at bedside.    PICC Placement Documentation  PICC Triple Lumen 11/18/20 PICC Right Brachial 40 cm 0 cm (Active)  Indication for Insertion or Continuance of Line Administration of hyperosmolar/irritating solutions (i.e. TPN, Vancomycin, etc.) 11/18/20 1900  Exposed Catheter (cm) 0 cm 11/18/20 1900  Site Assessment Clean;Dry;Intact 11/18/20 1900  Lumen #1 Status Flushed;Blood return noted 11/18/20 1900  Lumen #2 Status Flushed;Blood return noted 11/18/20 1900  Lumen #3 Status Flushed;Blood return noted 11/18/20 1900  Dressing Type Transparent 11/18/20 1900  Dressing Status Clean;Dry;Intact 11/18/20 1900  Antimicrobial disc in place? Yes 11/18/20 1900  Dressing Intervention New dressing 11/18/20 1900  Dressing Change Due 11/25/20 11/18/20 1900       Mickeal Skinner 11/18/2020, 7:49 PM

## 2020-11-18 NOTE — Progress Notes (Addendum)
PROGRESS NOTE    Eric [name]  GUR:[id number] DOB: Apr 04, 1971 DOA: 11/15/2020 PCP: [facility]   Brief Narrative: Eric [name] is a 50 y.o. male no known medical history. Patient presented secondary to right groin pain and found to have a retroperitoneal abscess/bowel perforation/cecal mass concerning for neoplasm. Incidental finding of COVID-19 positive status. General surgery consulted on admission.   Assessment & Plan:   Principal Problem:   Retroperitoneal abscess ([place]) Active Problems:   Colonic mass   Iron deficiency anemia due to chronic blood loss   Sepsis ([place])   COVID-19 virus infection   Thrombocytosis   Hyponatremia   Protein-calorie malnutrition, severe (HCC)   Liver mass, right lobe   Right groin pain   Ambulates with cane   Tobacco abuse   Abdominal pain, RLQ (right lower quadrant)   Fatigue   Symptomatic anemia   Sepsis Present on admission. Secondary to retroperitoneal abscess. Started on IV zosyn. Blood cultures no growth to date. Procalcitonin trending down. -Follow up blood cultures  Retroperitoneal abscess Bowel perforation Started on Zosyn IV. General surgery consulted and are following. Lyndel Safe cardiac risk score of 0.2% and pulmonary score for pneumonia of 0.4%. Patient has no known cardiac/pulmonary disease. -General surgery recommendations: antibiotics, plan for operative management  Colonic mass Concern for neoplasm in setting of above.  Thrombocytosis Platelets of 1165k on admission. Likely reactive. Improving.  COVID-19 infection Incidental. Asymptomatic. On room air. Started on Remdesivir.  -Continue Remdesivir x3 doses  Leukocytosis Secondary to above infection/perforation. Trended up slightly  Iron deficiency anemia Possibly related to colonic mass. Patient with a hemoglobin of 5.4 on admission and given 2 units of PRBC. Hemoglobin stable. -Daily CBC  Liver lesion Indeterminate hypodensity with  recommendation for liver MRI  Hyponatremia Worsening. Unsure of etiology. Urine sodium is high. With concern for cancer, possible this could be SIADH as well. -Urine/serum osmolality; pending result will give IV fluid challenge vs fluid restriction -AM Cortisol -Sodium q8 hours  Tobacco abuse Smoking cessation discussed. -Continue nicotine    DVT prophylaxis: SCDs Code Status:   Code Status: Full Code Family Communication: None at bedside Disposition Plan: Discharge likely in several days pending general surgery recommendations for management.   Consultants:   General surgery  Procedures:   None  Antimicrobials:  Zosyn IV    Subjective: No concerns today except abdominal pain is stable.  Objective: Vitals:   11/17/20 1242 11/17/20 1600 11/17/20 1929 11/18/20 0426  BP: 121/80 110/69 118/74 113/84  Pulse: 98 (!) 108 (!) 104 92  Resp: 18 18 18 17   Temp: 99.7 F (37.6 C) 99 F (37.2 C) 99.5 F (37.5 C) 99.5 F (37.5 C)  TempSrc: Oral Oral Oral Oral  SpO2: 94% 96% 95% 93%  Weight:      Height:        Intake/Output Summary (Last 24 hours) at 11/18/2020 1242 Last data filed at 11/18/2020 0631 Gross per 24 hour  Intake 820 ml  Output 500 ml  Net 320 ml   Filed Weights   11/15/20 2000  Weight: 69.4 kg    Examination:  General exam: Appears calm and comfortable. Thin appearing. Respiratory system: Clear to auscultation. Respiratory effort normal. Cardiovascular system: S1 & S2 heard, RRR. No murmurs, rubs, gallops or clicks. Gastrointestinal system: Abdomen is nondistended, soft and tender in RUQ/RLQ. No organomegaly or masses felt. Normal bowel sounds heard. Central nervous system: Alert and oriented. No focal neurological deficits. Musculoskeletal: No edema. No calf tenderness  Skin: No cyanosis. No rashes Psychiatry: Judgement and insight appear normal. Mood & affect appropriate.    Data Reviewed: I have personally reviewed following labs and imaging  studies  CBC Lab Results  Component Value Date   WBC 20.8 (H) 11/18/2020   RBC 3.68 (L) 11/18/2020   HGB 7.6 (L) 11/18/2020   HCT 25.0 (L) 11/18/2020   MCV 67.9 (L) 11/18/2020   MCH 20.7 (L) 11/18/2020   PLT 818 (H) 11/18/2020   MCHC 30.4 11/18/2020   RDW 27.2 (H) 11/18/2020   LYMPHSABS 1.6 11/18/2020   MONOABS 1.3 (H) 11/18/2020   EOSABS 0.0 11/18/2020   BASOSABS 0.1 [id number]     Last metabolic panel Lab Results  Component Value Date   NA 124 (L) 11/18/2020   K 3.8 11/18/2020   CL 94 (L) 11/18/2020   CO2 22 11/18/2020   BUN 11 11/18/2020   CREATININE 0.77 11/18/2020   GLUCOSE 123 (H) 11/18/2020   GFRNONAA >60 11/18/2020   CALCIUM 7.8 (L) 11/18/2020   PHOS 3.5 11/18/2020   PROT 5.8 (L) 11/18/2020   ALBUMIN 1.5 (L) 11/18/2020   BILITOT 0.4 11/18/2020   ALKPHOS 71 11/18/2020   AST 14 (L) 11/18/2020   ALT 14 11/18/2020   ANIONGAP 8 11/18/2020    CBG (last 3)  No results for input(s): GLUCAP in the last 72 hours.   GFR: Estimated Creatinine Clearance: 107.2 mL/min (by C-G formula based on SCr of 0.77 mg/dL).  Coagulation Profile: Recent Labs  Lab 11/15/20 1520  INR 1.2    Recent Results (from the past 240 hour(s))  SARS Coronavirus 2 by RT PCR (hospital order, performed in [facility] hospital lab) Nasopharyngeal Nasopharyngeal Swab     Status: Abnormal   Collection Time: 11/15/20 12:41 PM   Specimen: Nasopharyngeal Swab  Result Value Ref Range Status   SARS Coronavirus 2 POSITIVE (A) NEGATIVE Final    Comment: RESULT CALLED TO, READ BACK BY AND VERIFIED WITH: E SHAFFER RN 1352 11/15/20 A BROWNING (NOTE) SARS-CoV-2 target nucleic acids are DETECTED  SARS-CoV-2 RNA is generally detectable in upper respiratory specimens  during the acute phase of infection.  Positive results are indicative  of the presence of the identified virus, but do not rule out bacterial infection or co-infection with other pathogens not detected by the test.  Clinical  correlation with patient history and  other diagnostic information is necessary to determine patient infection status.  The expected result is negative.  Fact Sheet for Patients:   StrictlyIdeas.no   Fact Sheet for Healthcare Providers:   BankingDealers.co.za    This test is not yet approved or cleared by the [place] FDA and  has been authorized for detection and/or diagnosis of SARS-CoV-2 by FDA under an Emergency Use Authorization (EUA).  This EUA will remain in effect (meaning this t est can be used) for the duration of  the COVID-19 declaration under Section 564(b)(1) of the Act, 21 U.S.C. section 360-bbb-3(b)(1), unless the authorization is terminated or revoked sooner.  Performed at [facility], [place] [address], [place], [place] [address]   Blood Culture (routine x 2)     Status: None (Preliminary result)   Collection Time: 11/15/20  1:52 PM   Specimen: BLOOD RIGHT FOREARM  Result Value Ref Range Status   Specimen Description BLOOD RIGHT FOREARM  Final   Special Requests   Final    BOTTLES DRAWN AEROBIC AND ANAEROBIC Blood Culture adequate volume   Culture   Final  NO GROWTH 3 DAYS Performed at [facility], [place] [address] [place], [place] [address]    Report Status PENDING  Incomplete  Blood Culture (routine x 2)     Status: None (Preliminary result)   Collection Time: 11/15/20  2:13 PM   Specimen: BLOOD LEFT FOREARM  Result Value Ref Range Status   Specimen Description BLOOD LEFT FOREARM  Final   Special Requests   Final    BOTTLES DRAWN AEROBIC AND ANAEROBIC Blood Culture adequate volume   Culture   Final    NO GROWTH 3 DAYS Performed at [facility], [place] [address] [place], [place] [address]    Report Status PENDING  Incomplete        Radiology Studies: [place] EKG SITE RITE  Result Date: 11/18/2020 If Site Rite image not attached, placement could not be confirmed due to current cardiac  rhythm.     Scheduled Meds: . feeding supplement  237 mL Oral TID  . influenza vac split quadrivalent PF  0.5 mL Intramuscular Tomorrow-1000  . insulin aspart  0-15 Units Subcutaneous Q4H  . lip balm  1 application Topical BID  . nicotine  21 mg Transdermal Daily  . polyethylene glycol  17 g Oral Daily   Continuous Infusions: . iron dextran (INFED/DEXFERRUM) infusion     Followed by  . iron dextran (INFED/DEXFERRUM) infusion    . lactated ringers    . methocarbamol (ROBAXIN) IV    . piperacillin-tazobactam (ZOSYN)  IV 3.375 g (11/18/20 0631)  . TPN ADULT (ION)       LOS: 3 days     Cordelia Poche, MD [facility] 11/18/2020, 12:42 PM  If 7PM-7AM, please contact night-coverage [email or web address]

## 2020-11-18 NOTE — Progress Notes (Addendum)
PHARMACY - TOTAL PARENTERAL NUTRITION CONSULT NOTE   Indication: Perforated right colon into retroperitoneum  Patient Measurements: Height: 6' (182.9 cm) Weight: 69.4 kg (153 lb) IBW/kg (Calculated) : 77.6 TPN AdjBW (KG): 69.4 Body mass index is 20.75 kg/m. Usual Weight: 170 lbs  Assessment: Eric [name] is a 50 y.o. male no known medical history. Patient presented secondary to right groin pain and found to have a retroperitoneal abscess/bowel perforation/cecal mass concerning for neoplasm. Incidental finding of COVID-19 positive status. Surgery following, no surgical interventions at this time. Plans for short term management with drains and IV abx. Possible right colectomy later this week   Glucose / Insulin: No history of diabetes, will start SSI with TPN Electrolytes: Na 124, K 3.8, Mag 1.9, Phos 3.5 Renal: Scr 0.77 LFTs / TGs: AST/ALT and Tbili wnl Prealbumin / albumin: 6.7; 1.5 Intake / Output; MIVF: 0.5 ml.kg/hr GI Imaging: None since TPN start Surgeries / Procedures: None since TPN start  Central access: PICC placement ordered for 11/18/20 TPN start date: 11/18/20  Nutritional Goals (per RD recommendation on 2/5): Kcal:  2200-2430; Protein:  110-125; Fluid:  >/= 2.2 L Goal TPN rate is 95 mL/hr (provides 120 g of protein and an average of 2243 kcals per day - with lipids on MWF)  Current Nutrition:  NPO  Plan:  Start TPN at 45 mL/hr at 1800 - due to national backorder lipids will only be added MWF - will start on Wed for this patient Electrolytes in TPN: 79mEq/L of Na, 61mEq/L of K, 79mEq/L of Ca, 73mEq/L of Mg, and 55mmol/L of Phos. Cl:Ac ratio 1:1 Add standard MVI and trace elements to TPN Initiate Moderate q4h SSI and adjust as needed  Monitor TPN labs daily until stabilized then on Mon/Thurs   Alanda Slim, PharmD, [facility] Clinical Pharmacist Please see AMION for all Pharmacists' Contact Phone Numbers 11/18/2020, 12:37 PM

## 2020-11-18 NOTE — Progress Notes (Signed)
Patient being transported to IR at this time.

## 2020-11-18 NOTE — Procedures (Signed)
Interventional Radiology Procedure Note  Procedure: CT guided right retroperitoneal drain placement  Findings: Please refer to procedural dictation for full description.  14 Fr pigtail placed in right retroperitoneum.  Approximately 400 mL purulent output.  Samples for culture and cytology sent.  Placed to bulb suction.  Complications: None immediate  Estimated Blood Loss: < 5 mL  Recommendations: Keep to bulb suction for now. IR will continue to follow. Follow up cultures, cytology.   Ruthann Cancer, MD Pager: [phone]

## 2020-11-18 NOTE — Consult Note (Signed)
Consultation Note Date: 11/18/2020   Patient Name: Eric [name]  DOB: [birth year]  MRN: [id number]  Age / Sex: 50 y.o., male  PCP: [facility] Referring Physician: Mariel Aloe, MD  Reason for Consultation: Establishing goals of care  HPI/Patient Profile: 50 y.o. 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 Also with incidental finding of COVID+ infection.   Clinical Assessment and Goals of Care: I met today with [name]. We discussed his situation and he understands that he will need surgery. He understands that he very likely has a cancer and that we will send of biopsy for more information when he has surgery. He understands he has a perforation and infection. We discussed the next steps to getting him through surgery and recovery and then he will likely need to follow up with oncologist for options.  I discussed with Baley about his overall condition (noting temporal muscle wasting) and he confirms that he has lost ~20 lbs over the past months to a year. He initially notes good appetite and intake but later admits that he would often just have a cup of coffee for dinner. I expressed concern that this is even more concerning for malignancy that may have been present for some time now. Helmut shares that he trusts sister, Cecille Rubin, fully and identifies her as Air traffic controller. He also shares that he would not want to be on life support long term if no improvement as "this is not living." He gives me permission to discuss with Cecille Rubin.   I called and discussed with Cecille Rubin. We reviewed CT scan and drain placement with recommendations for surgical resection and ileostomy. Cecille Rubin shares that her husband has died from cancer so  she is familiar with work up and treatment processes. She wants to be able to support her brother through whatever the future holds for him. I expressed concern for colon mass and perforation but also with weight loss and decreased appetite over the past ~1 year I am even more concerned. Cecille Rubin shares that [employer] roommate has also shared that Darvin has had frequent vomiting over the past year but worsened over the past ~ 1 week. Nainoa has avoided going to doctors but Cecille Rubin plans to support him through recommended surgery and treatment plans.   All questions/concerns addressed. Emotional support provided.   Primary Decision Maker PATIENT    SUMMARY OF RECOMMENDATIONS   - Plans to pursue surgical intervention, work up, and follow up as recommended  Code Status/Advance Care Planning:  Full code - would not want prolonged intubation   Symptom Management:   Per attending and surgery.   Palliative Prophylaxis:   Bowel Regimen and Frequent Pain Assessment  Additional Recommendations (Limitations, Scope, Preferences):  Full Scope Treatment  Psycho-social/Spiritual:   Desire for further Chaplaincy support:yes  Additional Recommendations: Caregiving  Support/Resources  Prognosis:   Unable to  determine  Discharge Planning: To Be Determined      Primary Diagnoses: Present on Admission: . Colonic mass . Iron deficiency anemia due to chronic blood loss . Sepsis ([place])   I have reviewed the medical record, interviewed the patient and family, and examined the patient. The following aspects are pertinent.  History reviewed. No pertinent past medical history. Social History   Socioeconomic History  . Marital status: Single    Spouse name: Not on file  . Number of children: Not on file  . Years of education: Not on file  . Highest education level: Not on file  Occupational History  . Not on file  Tobacco Use  . Smoking status: Current Every Day Smoker  . Smokeless tobacco:  Never Used  . Tobacco comment: Up to 1.5 PPD since age 66  Substance and Sexual Activity  . Alcohol use: Not Currently  . Drug use: Yes    Types: Marijuana  . Sexual activity: Not on file  Other Topics Concern  . Not on file  Social History Narrative  . Not on file   Social Determinants of Health   Financial Resource Strain: Not on file  Food Insecurity: Not on file  Transportation Needs: Not on file  Physical Activity: Not on file  Stress: Not on file  Social Connections: Not on file   Family History  Problem Relation Age of Onset  . Diabetes Sister    Scheduled Meds: . feeding supplement  237 mL Oral TID  . influenza vac split quadrivalent PF  0.5 mL Intramuscular Tomorrow-1000  . nicotine  21 mg Transdermal Daily   Continuous Infusions: . piperacillin-tazobactam (ZOSYN)  IV 3.375 g (11/18/20 0631)   PRN Meds:.acetaminophen, melatonin, morphine injection, ondansetron **OR** ondansetron (ZOFRAN) IV No Known Allergies Review of Systems  Constitutional: Positive for appetite change. Negative for fatigue.  Neurological: Negative for weakness.    Physical Exam Constitutional:      General: He is not in acute distress.    Appearance: He is ill-appearing.     Comments: Muscle wasting  Cardiovascular:     Rate and Rhythm: Normal rate.  Pulmonary:     Effort: Pulmonary effort is normal. No tachypnea, accessory muscle usage or respiratory distress.  Abdominal:     General: Abdomen is flat.     Palpations: Abdomen is soft.  Neurological:     Mental Status: He is alert and oriented to person, place, and time.     Vital Signs: BP 113/84 (BP Location: Left Arm)   Pulse 92   Temp 99.5 F (37.5 C) (Oral)   Resp 17   Ht 6' (1.829 m)   Wt 69.4 kg   SpO2 93%   BMI 20.75 kg/m  Pain Scale: 0-10 POSS *See Group Information*: 1-Acceptable,Awake and alert Pain Score: Asleep   SpO2: SpO2: 93 % O2 Device:SpO2: 93 % O2 Flow Rate: .   IO: Intake/output summary:    Intake/Output Summary (Last 24 hours) at 11/18/2020 1015 Last data filed at 11/18/2020 0631 Gross per 24 hour  Intake 820 ml  Output 500 ml  Net 320 ml    LBM: Last BM Date: 11/17/20 Baseline Weight: Weight: 69.4 kg Most recent weight: Weight: 69.4 kg     Palliative Assessment/Data:     Time In/Out: 1010-1040, 1530-1550 Time Total: 50 min Greater than 50%  of this time was spent counseling and coordinating care related to the above assessment and plan.  Signed by: Vinie Sill, NP Palliative Medicine  Team Pager # [phone] (M-F 8a-5p) Team Phone # [phone] (Nights/Weekends)

## 2020-11-18 NOTE — Progress Notes (Signed)
General Surgery Follow Up Note  Subjective:    Overnight Issues:   Objective:  Vital signs for last 24 hours: Temp:  [99 F (37.2 C)-99.5 F (37.5 C)] 99.2 F (37.3 C) (02/07 1800) Pulse Rate:  [92-99] 96 (02/07 1800) Resp:  [10-19] 17 (02/07 1800) BP: (102-120)/(63-84) 112/74 (02/07 1800) SpO2:  [93 %-97 %] 93 % (02/07 1800)  Hemodynamic parameters for last 24 hours:    Intake/Output from previous day: 02/06 0701 - 02/07 0700 In: 1057 [P.O.:720; IV Piggyback:337] Out: 800 [Urine:800]  Intake/Output this shift: Total I/O In: 897 [P.O.:237; I.V.:400; IV Piggyback:260] Out: 275 [Urine:275]  Vent settings for last 24 hours:    Physical Exam:  Gen: comfortable, no distress Neuro: non-focal exam HEENT: PERRL Neck: supple CV: RRR Pulm: unlabored breathing Abd: soft, NT GU: clear yellow urine Extr: wwp, no edema   Results for orders placed or performed during the hospital encounter of 11/15/20 (from the past 24 hour(s))  CBC with Differential/Platelet     Status: Abnormal   Collection Time: 11/18/20  4:08 AM  Result Value Ref Range   WBC 20.8 (H) 4.0 - 10.5 K/uL   RBC 3.68 (L) 4.22 - 5.81 MIL/uL   Hemoglobin 7.6 (L) 13.0 - 17.0 g/dL   HCT 25.0 (L) 39.0 - 52.0 %   MCV 67.9 (L) 80.0 - 100.0 fL   MCH 20.7 (L) 26.0 - 34.0 pg   MCHC 30.4 30.0 - 36.0 g/dL   RDW 27.2 (H) 11.5 - 15.5 %   Platelets 818 (H) 150 - 400 K/uL   nRBC 0.0 0.0 - 0.2 %   Neutrophils Relative % 84 %   Neutro Abs 17.5 (H) 1.7 - 7.7 K/uL   Lymphocytes Relative 8 %   Lymphs Abs 1.6 0.7 - 4.0 K/uL   Monocytes Relative 6 %   Monocytes Absolute 1.3 (H) 0.1 - 1.0 K/uL   Eosinophils Relative 0 %   Eosinophils Absolute 0.0 0.0 - 0.5 K/uL   Basophils Relative 0 %   Basophils Absolute 0.1 0.0 - 0.1 K/uL   Immature Granulocytes 2 %   Abs Immature Granulocytes 0.32 (H) 0.00 - 0.07 K/uL  Comprehensive metabolic panel     Status: Abnormal   Collection Time: 11/18/20  4:08 AM  Result Value Ref Range    Sodium 124 (L) 135 - 145 mmol/L   Potassium 3.8 3.5 - 5.1 mmol/L   Chloride 94 (L) 98 - 111 mmol/L   CO2 22 22 - 32 mmol/L   Glucose, Bld 123 (H) 70 - 99 mg/dL   BUN 11 6 - 20 mg/dL   Creatinine, Ser 0.77 0.61 - 1.24 mg/dL   Calcium 7.8 (L) 8.9 - 10.3 mg/dL   Total Protein 5.8 (L) 6.5 - 8.1 g/dL   Albumin 1.5 (L) 3.5 - 5.0 g/dL   AST 14 (L) 15 - 41 U/L   ALT 14 0 - 44 U/L   Alkaline Phosphatase 71 38 - 126 U/L   Total Bilirubin 0.4 0.3 - 1.2 mg/dL   GFR, Estimated >60 >60 mL/min   Anion gap 8 5 - 15  D-dimer, quantitative (not at [facility])     Status: Abnormal   Collection Time: 11/18/20  4:08 AM  Result Value Ref Range   D-Dimer, Quant 3.53 (H) 0.00 - 0.50 ug/mL-FEU  Ferritin     Status: None   Collection Time: 11/18/20  4:08 AM  Result Value Ref Range   Ferritin 137 24 - 336 ng/mL  Magnesium     Status: None   Collection Time: 11/18/20  4:08 AM  Result Value Ref Range   Magnesium 1.9 1.7 - 2.4 mg/dL  Phosphorus     Status: None   Collection Time: 11/18/20  4:08 AM  Result Value Ref Range   Phosphorus 3.5 2.5 - 4.6 mg/dL  C-reactive protein     Status: Abnormal   Collection Time: 11/18/20  4:08 AM  Result Value Ref Range   CRP 20.2 (H) <1.0 mg/dL  Procalcitonin     Status: None   Collection Time: 11/18/20  4:08 AM  Result Value Ref Range   Procalcitonin 11.10 ng/mL  Osmolality, urine     Status: None   Collection Time: 11/18/20 12:35 PM  Result Value Ref Range   Osmolality, Ur 409 300 - 900 mOsm/kg  Prealbumin     Status: Abnormal   Collection Time: 11/18/20  1:00 PM  Result Value Ref Range   Prealbumin 5.7 (L) 18 - 38 mg/dL  Glucose, capillary     Status: Abnormal   Collection Time: 11/18/20  2:31 PM  Result Value Ref Range   Glucose-Capillary 113 (H) 70 - 99 mg/dL  Osmolality     Status: Abnormal   Collection Time: 11/18/20  2:44 PM  Result Value Ref Range   Osmolality 262 (L) 275 - 295 mOsm/kg  TSH     Status: None   Collection Time: 11/18/20  2:44 PM   Result Value Ref Range   TSH 3.642 0.350 - 4.500 uIU/mL  Glucose, capillary     Status: None   Collection Time: 11/18/20  4:39 PM  Result Value Ref Range   Glucose-Capillary 95 70 - 99 mg/dL  Glucose, capillary     Status: Abnormal   Collection Time: 11/18/20  9:18 PM  Result Value Ref Range   Glucose-Capillary 170 (H) 70 - 99 mg/dL  Sodium     Status: Abnormal   Collection Time: 11/18/20  9:42 PM  Result Value Ref Range   Sodium 128 (L) 135 - 145 mmol/L    Assessment & Plan:   Present on Admission: . Colonic mass . Iron deficiency anemia due to chronic blood loss . Sepsis ([place])    LOS: 3 days   Additional comments:I reviewed the patient's new clinical lab test results.   and I reviewed the patients new imaging test results.    R retroperitoneal abscess - suspicious for perforated colon cancer and abscess. Given no intra-abdominal leakage and no peritoneal signs on clinical exam, will attempt to manage this with percutaneous drainage. IR consult placed for this. Recommend PICC line for IV abx and TPN. TPN c/s placed. Continue NPO. Will d/w GI the utility of colonoscopy to obtain tissue diagnosis, whether acutely or subacutely. Do not want to convert a contained retroperitoneal perforation into a intra-peritoneal perforation, so will need to give careful consideration of this.  FEN - NPO, TPN DVT - SCDs, recommend LMWH Dispo - floor   Jesusita Oka, MD Trauma & General Surgery Please use [email or web address] to contact on call provider  11/18/2020  *Care during the described time interval was provided by me. I have reviewed this patient's available data, including medical history, events of note, physical examination and test results as part of my evaluation.

## 2020-11-19 LAB — COMPREHENSIVE METABOLIC PANEL
ALT: 17 U/L (ref 0–44)
AST: 24 U/L (ref 15–41)
Albumin: 1.5 g/dL — ABNORMAL LOW (ref 3.5–5.0)
Alkaline Phosphatase: 76 U/L (ref 38–126)
Anion gap: 9 (ref 5–15)
BUN: 13 mg/dL (ref 6–20)
CO2: 22 mmol/L (ref 22–32)
Calcium: 7.7 mg/dL — ABNORMAL LOW (ref 8.9–10.3)
Chloride: 97 mmol/L — ABNORMAL LOW (ref 98–111)
Creatinine, Ser: 0.68 mg/dL (ref 0.61–1.24)
GFR, Estimated: >60 mL/min (ref >60)
Glucose, Bld: 124 mg/dL — ABNORMAL HIGH (ref 70–99)
Potassium: 4 mmol/L (ref 3.5–5.1)
Sodium: 128 mmol/L — ABNORMAL LOW (ref 135–145)
Total Bilirubin: 0.5 mg/dL (ref 0.3–1.2)
Total Protein: 5.5 g/dL — ABNORMAL LOW (ref 6.5–8.1)

## 2020-11-19 LAB — D-DIMER, QUANTITATIVE: D-Dimer, Quant: 2.77 ug/mL-FEU — ABNORMAL HIGH (ref 0.00–0.50)

## 2020-11-19 LAB — CBC WITH DIFFERENTIAL/PLATELET
Abs Immature Granulocytes: 0.29 10*3/uL — ABNORMAL HIGH (ref 0.00–0.07)
Basophils Absolute: 0 10*3/uL (ref 0.0–0.1)
Basophils Relative: 0 %
Eosinophils Absolute: 0 10*3/uL (ref 0.0–0.5)
Eosinophils Relative: 0 %
HCT: 23.1 % — ABNORMAL LOW (ref 39.0–52.0)
Hemoglobin: 7.2 g/dL — ABNORMAL LOW (ref 13.0–17.0)
Immature Granulocytes: 2 %
Lymphocytes Relative: 9 %
Lymphs Abs: 1.4 10*3/uL (ref 0.7–4.0)
MCH: 21.3 pg — ABNORMAL LOW (ref 26.0–34.0)
MCHC: 31.2 g/dL (ref 30.0–36.0)
MCV: 68.3 fL — ABNORMAL LOW (ref 80.0–100.0)
Monocytes Absolute: 0.9 10*3/uL (ref 0.1–1.0)
Monocytes Relative: 6 %
Neutro Abs: 13.1 10*3/uL — ABNORMAL HIGH (ref 1.7–7.7)
Neutrophils Relative %: 83 %
Platelets: 860 10*3/uL — ABNORMAL HIGH (ref 150–400)
RBC: 3.38 MIL/uL — ABNORMAL LOW (ref 4.22–5.81)
RDW: 28 % — ABNORMAL HIGH (ref 11.5–15.5)
WBC: 15.7 10*3/uL — ABNORMAL HIGH (ref 4.0–10.5)
nRBC: 0 % (ref 0.0–0.2)

## 2020-11-19 LAB — CORTISOL-AM, BLOOD
Cortisol - AM: 17.8 ug/dL (ref 6.7–22.6)
Cortisol - AM: 26 ug/dL — ABNORMAL HIGH (ref 6.7–22.6)

## 2020-11-19 LAB — MAGNESIUM: Magnesium: 2.2 mg/dL (ref 1.7–2.4)

## 2020-11-19 LAB — HEMOGLOBIN AND HEMATOCRIT, BLOOD
HCT: 25.5 % — ABNORMAL LOW (ref 39.0–52.0)
Hemoglobin: 7 g/dL — ABNORMAL LOW (ref 13.0–17.0)

## 2020-11-19 LAB — GLUCOSE, CAPILLARY
Glucose-Capillary: 121 mg/dL — ABNORMAL HIGH (ref 70–99)
Glucose-Capillary: 123 mg/dL — ABNORMAL HIGH (ref 70–99)
Glucose-Capillary: 126 mg/dL — ABNORMAL HIGH (ref 70–99)
Glucose-Capillary: 128 mg/dL — ABNORMAL HIGH (ref 70–99)
Glucose-Capillary: 134 mg/dL — ABNORMAL HIGH (ref 70–99)
Glucose-Capillary: 151 mg/dL — ABNORMAL HIGH (ref 70–99)
Glucose-Capillary: 160 mg/dL — ABNORMAL HIGH (ref 70–99)

## 2020-11-19 LAB — SODIUM
Sodium: 126 mmol/L — ABNORMAL LOW (ref 135–145)
Sodium: 129 mmol/L — ABNORMAL LOW (ref 135–145)

## 2020-11-19 LAB — FERRITIN: Ferritin: 127 ng/mL (ref 24–336)

## 2020-11-19 LAB — PHOSPHORUS: Phosphorus: 3.2 mg/dL (ref 2.5–4.6)

## 2020-11-19 LAB — C-REACTIVE PROTEIN: CRP: 19.9 mg/dL — ABNORMAL HIGH (ref <1.0)

## 2020-11-19 LAB — PREALBUMIN: Prealbumin: 5.8 mg/dL — ABNORMAL LOW (ref 18–38)

## 2020-11-19 LAB — TRIGLYCERIDES: Triglycerides: 94 mg/dL (ref <150)

## 2020-11-19 MED ORDER — CALCIUM POLYCARBOPHIL 625 MG PO TABS
625.0000 mg | ORAL_TABLET | Freq: Two times a day (BID) | ORAL | Status: DC
  Administered 2020-11-19 – 2020-12-01 (×23): 625 mg via ORAL
  Filled 2020-11-19 (×27): qty 1

## 2020-11-19 MED ORDER — HYDROMORPHONE HCL 1 MG/ML IJ SOLN: 0.5000 mg | INTRAMUSCULAR | Status: DC | PRN

## 2020-11-19 MED ORDER — NICOTINE 14 MG/24HR TD PT24: 14.0000 mg | MEDICATED_PATCH | Freq: Every day | TRANSDERMAL | Status: DC

## 2020-11-19 MED ORDER — ACETAMINOPHEN 500 MG PO TABS
1000.0000 mg | ORAL_TABLET | Freq: Four times a day (QID) | ORAL | Status: DC
  Administered 2020-11-19: 1000 mg via ORAL
  Filled 2020-11-19: qty 2

## 2020-11-19 MED ORDER — ENSURE SURGERY PO LIQD: 237.0000 mL | Freq: Two times a day (BID) | ORAL | Status: DC

## 2020-11-19 MED ORDER — TRAVASOL 10 % IV SOLN
INTRAVENOUS | Status: AC
  Filled 2020-11-19: qty 883.7

## 2020-11-19 MED ORDER — ACETAMINOPHEN 325 MG PO TABS
325.0000 mg | ORAL_TABLET | Freq: Four times a day (QID) | ORAL | Status: DC | PRN
  Administered 2020-11-20 – 2020-11-26 (×12): 650 mg via ORAL
  Filled 2020-11-19 (×12): qty 2

## 2020-11-19 NOTE — Progress Notes (Signed)
PROGRESS NOTE    Eric Eric [name]  WJX:[id number] DOB: [birth year] DOA: 11/15/2020 PCP: [facility], [facility]   Brief Narrative: Eric Eric [name] is a 50 y.o. male no known medical Eric [name]. Patient presented secondary to right groin pain and found to have a retroperitoneal abscess/bowel perforation/cecal mass concerning for neoplasm. Incidental finding of COVID-19 positive status. General surgery consulted on admission.   Assessment & Plan:   Principal Problem:   Retroperitoneal abscess ([place]) Active Problems:   Colonic mass   Iron deficiency anemia due to chronic blood loss   Sepsis ([place])   COVID-19 virus infection   Thrombocytosis   Hyponatremia   Protein-calorie malnutrition, severe (HCC)   Liver mass, right lobe   Right groin pain   Ambulates with cane   Tobacco abuse   Abdominal pain, RLQ (right lower quadrant)   Fatigue   Symptomatic anemia   Sepsis Present on admission. Secondary to retroperitoneal abscess. Started on IV zosyn. Blood cultures no growth to date. Procalcitonin trending down. Blood cultures with no growth to date.  Retroperitoneal abscess Bowel perforation Started on Zosyn IV. General surgery consulted and are following. Eric Eric [name] cardiac risk score of 0.2% and pulmonary score for pneumonia of 0.4%. Patient has no known cardiac/pulmonary disease. -General surgery recommendations: antibiotics, percutaneous drain per IR placed on 2/7, TPN -Follow-up wound culture  Colonic mass Concern for neoplasm in setting of above.  Thrombocytosis Platelets of 1165k on admission. Likely reactive. Improving.  COVID-19 infection Incidental. Asymptomatic. On room air. Started on Remdesivir and completed three doses.  Leukocytosis Secondary to above infection/perforation. Trended up slightly  Iron deficiency anemia Possibly related to colonic mass. Patient with a hemoglobin of 5.4 on admission and given 2 units of PRBC. Hemoglobin trending down slightly. -Daily  CBC -Repeat H&H  Liver lesion Indeterminate hypodensity with recommendation for liver MRI  Hyponatremia Worsening. Unsure of etiology. Urine sodium is high. With concern for cancer, possible this could be SIADH as well. Cortisol level normal. Improvement of sodium with NS IV fluid trial -Sodium q8 hours  Tobacco abuse Smoking cessation discussed. -Continue nicotine    DVT prophylaxis: SCDs Code Status:   Code Status: Full Code Family Communication: None at bedside Disposition Plan: Discharge likely in several days pending general surgery recommendations for management.   Consultants:   General surgery  Procedures:   None  Antimicrobials:  Zosyn IV    Subjective: Feeling much better now that drain was placed.  Objective: Vitals:   11/18/20 1718 11/18/20 1800 11/19/20 0400 11/19/20 1150  BP: 117/83 112/74 127/79 116/74  Pulse: 99 96 89 92  Resp: 13 17 18 16   Temp: 99 F (37.2 C) 99.2 F (37.3 C) 99.1 F (37.3 C) 98.5 F (36.9 C)  TempSrc: Oral Oral Oral Oral  SpO2: 95% 93% 92% 94%  Weight:      Height:        Intake/Output Summary (Last 24 hours) at 11/19/2020 1201 Last data filed at 11/19/2020 1115 Gross per 24 hour  Intake 2669.21 ml  Output 1550 ml  Net 1119.21 ml   Filed Weights   11/15/20 2000  Weight: 69.4 kg    Examination:  General exam: Appears calm and comfortable. Thin. Respiratory system: Clear to auscultation. Respiratory effort normal. Cardiovascular system: S1 & S2 heard, RRR. No murmurs, rubs, gallops or clicks. Gastrointestinal system: Abdomen is nondistended, soft and mildly tender in RLQ. No organomegaly or masses felt. Normal bowel sounds heard. Central nervous system: Alert and oriented. No focal  neurological deficits. Musculoskeletal: No edema. No calf tenderness Skin: No cyanosis. No rashes Psychiatry: Judgement and insight appear normal. Mood & affect appropriate.    Data Reviewed: I have personally reviewed following  labs and imaging studies  CBC Lab Results  Component Value Date   WBC 15.7 (H) 11/19/2020   RBC 3.38 (L) 11/19/2020   HGB 7.2 (L) 11/19/2020   HCT 23.1 (L) 11/19/2020   MCV 68.3 (L) 11/19/2020   MCH 21.3 (L) 11/19/2020   PLT 860 (H) 11/19/2020   MCHC 31.2 11/19/2020   RDW 28.0 (H) 11/19/2020   LYMPHSABS 1.4 11/19/2020   MONOABS 0.9 11/19/2020   EOSABS 0.0 11/19/2020   BASOSABS 0.0 97/35/3299     Last metabolic panel Lab Results  Component Value Date   NA 128 (L) 11/19/2020   K 4.0 11/19/2020   CL 97 (L) 11/19/2020   CO2 22 11/19/2020   BUN 13 11/19/2020   CREATININE 0.68 11/19/2020   GLUCOSE 124 (H) 11/19/2020   GFRNONAA >60 11/19/2020   CALCIUM 7.7 (L) 11/19/2020   PHOS 3.2 11/19/2020   PROT 5.5 (L) 11/19/2020   ALBUMIN 1.5 (L) 11/19/2020   BILITOT 0.5 11/19/2020   ALKPHOS 76 11/19/2020   AST 24 11/19/2020   ALT 17 11/19/2020   ANIONGAP 9 11/19/2020    CBG (last 3)  Recent Labs    11/19/20 0405 11/19/20 0858 11/19/20 1152  GLUCAP 128* 123* 151*     GFR: Estimated Creatinine Clearance: 107.2 mL/min (by C-G formula based on SCr of 0.68 mg/dL).  Coagulation Profile: Recent Labs  Lab 11/15/20 1520  INR 1.2    Recent Results (from the past 240 hour(s))  SARS Coronavirus 2 by RT PCR (hospital order, performed in [facility] hospital lab) Nasopharyngeal Nasopharyngeal Swab     Status: Abnormal   Collection Time: 11/15/20 12:41 PM   Specimen: Nasopharyngeal Swab  Result Value Ref Range Status   SARS Coronavirus 2 POSITIVE (A) NEGATIVE Final    Comment: RESULT CALLED TO, READ BACK BY AND VERIFIED WITH: E SHAFFER RN 1352 11/15/20 A BROWNING (NOTE) SARS-CoV-2 target nucleic acids are DETECTED  SARS-CoV-2 RNA is generally detectable in upper respiratory specimens  during the acute phase of infection.  Positive results are indicative  of the presence of the identified virus, but do not rule out bacterial infection or co-infection with other pathogens  not detected by the test.  Clinical correlation with patient Eric [name] and  other diagnostic information is necessary to determine patient infection status.  The expected result is negative.  Fact Sheet for Patients:   StrictlyIdeas.no   Fact Sheet for Healthcare Providers:   BankingDealers.co.za    This test is not yet approved or cleared by the [place] FDA and  has been authorized for detection and/or diagnosis of SARS-CoV-2 by FDA under an Emergency Use Authorization (EUA).  This EUA will remain in effect (meaning this t est can be used) for the duration of  the COVID-19 declaration under Section 564(b)(1) of the Act, 21 U.S.C. section 360-bbb-3(b)(1), unless the authorization is terminated or revoked sooner.  Performed at [facility], [place] [address], [place], [place] [address]   Blood Culture (routine x 2)     Status: None (Preliminary result)   Collection Time: 11/15/20  1:52 PM   Specimen: BLOOD RIGHT FOREARM  Result Value Ref Range Status   Specimen Description BLOOD RIGHT FOREARM  Final   Special Requests   Final    BOTTLES DRAWN  AEROBIC AND ANAEROBIC Blood Culture adequate volume   Culture   Final    NO GROWTH 4 DAYS Performed at [place] [address] [place], [place] [address]    Report Status PENDING  Incomplete  Blood Culture (routine x 2)     Status: None (Preliminary result)   Collection Time: 11/15/20  2:13 PM   Specimen: BLOOD LEFT FOREARM  Result Value Ref Range Status   Specimen Description BLOOD LEFT FOREARM  Final   Special Requests   Final    BOTTLES DRAWN AEROBIC AND ANAEROBIC Blood Culture adequate volume   Culture   Final    NO GROWTH 4 DAYS Performed at [facility], [place] [address] [place], [place] [address]    Report Status PENDING  Incomplete  Aerobic/Anaerobic Culture (surgical/deep wound)     Status: None (Preliminary result)   Collection Time: 11/18/20  4:34 PM    Specimen: Abscess  Result Value Ref Range Status   Specimen Description ABSCESS  Final   Special Requests ABDOMEN  Final   Gram Stain   Final    FEW WBC PRESENT, PREDOMINANTLY PMN MODERATE GRAM NEGATIVE RODS FEW GRAM POSITIVE COCCI IN PAIRS IN CHAINS FEW GRAM POSITIVE RODS Performed at [facility], [place] [address], [place], [place] [address]    Culture FEW GRAM NEGATIVE RODS  Final   Report Status PENDING  Incomplete        Radiology Studies: CT IMAGE GUIDED DRAINAGE BY PERCUTANEOUS CATHETER  Result Date: 11/19/2020 INDICATION: 50 year old male with Eric [name] of cecal perforation with fluid collection in the right retroperitoneum. Suspected underlying neoplastic etiology. EXAM: CT IMAGE GUIDED DRAINAGE BY PERCUTANEOUS CATHETER COMPARISON:  CT abdomen pelvis from 11/15/2020 MEDICATIONS: The patient is currently admitted to the hospital and receiving intravenous antibiotics. The antibiotics were administered within an appropriate time frame prior to the initiation of the procedure. ANESTHESIA/SEDATION: Moderate (conscious) sedation was employed during this procedure. A total of Versed 1 mg and Fentanyl 25 mcg was administered intravenously. Moderate Sedation Time: 21 minutes. The patient's level of consciousness and vital signs were monitored continuously by radiology nursing throughout the procedure under my direct supervision. CONTRAST:  None COMPLICATIONS: None immediate. PROCEDURE: Informed written consent was obtained from the patient after a discussion of the risks, benefits and alternatives to treatment. The patient was placed supine on the CT gantry and a pre procedural CT was performed re-demonstrating the known abscess/fluid collection within the right retroperitoneum. The procedure was planned. A timeout was performed prior to the initiation of the procedure. The right lower quadrant was prepped and draped in the usual sterile fashion. The overlying soft tissues were anesthetized  with 1% lidocaine with epinephrine. Appropriate trajectory was planned with the use of a 22 gauge spinal needle. An 18 gauge trocar needle was advanced into the abscess/fluid collection and a short Amplatz super stiff wire was coiled within the collection. Appropriate positioning was confirmed with a limited CT scan. The tract was serially dilated allowing placement of a 14 French all-purpose drainage catheter. Appropriate positioning was confirmed with a limited postprocedural CT scan. Approximately 400 mL ml of purulent fluid was aspirated. The tube was connected to a bulb suction and sutured in place. A dressing was placed. The patient tolerated the procedure well without immediate post procedural complication. IMPRESSION: Successful CT guided placement of a 61 French all purpose drain catheter into the right retroperitoneum with aspiration of approximately 400 mL of purulent fluid. Samples were sent  to the laboratory for culture as well as cytology given suspicion for underlying neoplastic etiology. Ruthann Cancer, MD [facility] Radiology Electronically Signed   By: Ruthann Cancer MD   On: 11/19/2020 08:01   [place] EKG SITE RITE  Result Date: 11/18/2020 If Site Rite image not attached, placement could not be confirmed due to current cardiac rhythm.     Scheduled Meds: . Chlorhexidine Gluconate Cloth  6 each Topical Daily  . feeding supplement  237 mL Oral TID  . influenza vac split quadrivalent PF  0.5 mL Intramuscular Tomorrow-1000  . insulin aspart  0-15 Units Subcutaneous Q4H  . lip balm  1 application Topical BID  . nicotine  21 mg Transdermal Daily  . polyethylene glycol  17 g Oral Daily  . sodium chloride flush  10-40 mL Intracatheter Q12H  . sodium chloride flush  5 mL Intracatheter Q8H   Continuous Infusions: . sodium chloride 100 mL/hr at 11/19/20 0014  . lactated ringers    . methocarbamol (ROBAXIN) IV    . piperacillin-tazobactam  (ZOSYN)  IV 3.375 g (11/19/20 0627)  . TPN ADULT (ION) 45 mL/hr at 11/18/20 1921  . TPN ADULT (ION)       LOS: 4 days     Cordelia Poche, MD [facility] 11/19/2020, 12:01 PM  If 7PM-7AM, please contact night-coverage [email or web address]

## 2020-11-19 NOTE — Progress Notes (Addendum)
Eric [name] [id number] 04-25-1971  CARE TEAM:  PCP: [facility]  Outpatient Care Team: Patient Care Team: [facility] as PCP - General (Family Medicine)  Inpatient Treatment Team: Treatment Team: Attending Provider: Mariel Aloe, MD; Rounding Team: Edison Pace, Md, MD; Rounding Team: Kelvin Cellar, MD; Nurse Practitioner: Pershing Proud, NP; Consulting Physician: Michael Boston, MD; Rounding Team: [facility], MD; Registered Nurse: Rolland Porter, RN; Case Manager: Carles Collet, RN; Social Worker: Benard Halsted, LCSW   Problem List:   Principal Problem:   Retroperitoneal abscess [facility]) Active Problems:   Colonic mass   Protein-calorie malnutrition, severe ([place])   Iron deficiency anemia due to chronic blood loss   Sepsis ([place])   COVID-19 virus infection   Thrombocytosis   Hyponatremia   Liver mass, right lobe   Right groin pain   Ambulates with cane   Tobacco abuse   Abdominal pain, RLQ (right lower quadrant)   Fatigue   Symptomatic anemia      * No surgery found *      Assessment  Recovering  [facility] Stay = 52 days)  Plan:  Continue IV antibiotics.  Initial gram-negative rods.  Probable Enterobacter.  Zosyn should be appropriate.  White count waning.  May adjust as needed.  Continue drain.  Output less purulent and consistent with old hematoma.  Discussed with nursing at bedside.  Continue flushes.  Clots expected but as long as stripped/flushed, should not be an issue.  Suspect patient will need drain for several weeks/motnhs with outpatient drain study to rule out chronic fistula since this could be from a perforated cancer.  MRI of liver at this admission if possible given liver lesion.  Cannot tell if this is an abscess versus possible metastatic disease.  If possible mets, may need biopsy.  If metastatic disease, medical oncology consultation & palliative chemotherapy  MRI of pelvis given  the low positioning of this retroperitoneal fluid/solid mass.  Could possibly be all abscess.  Also with right groin pain.  Would be helpful to delineate this atypical anatomy.  IV parenteral nutrition since he is severely malnourished with albumin 1.5.  He needs full nutrition.  Might be able to wean off next week if can advance his diet.  Given the fact he has flatus and is nontoxic without any massive peritonitis or free air, try liquid diet and see how it goes.  At some point, patient is going to require surgical resection of this right colon/retroperitoneal mass.   Ideally surgery could be delayed, patient could be rehabilitated, severe malnutrition corrected, tobacco cessation, recovery from CoVID, severe anemia stabilized, etc    Then at 6 weeks:  GI colonoscopy to rule out endoluminal cecal/ascending colon cancer and other possible lesions,   Urology cystoscopy for firefly immunofluorescent marking of ureters and probable stent placement.    ME: robotic proximal right colectomy with possible retroperitoneal resection and possible immediate anastomosis  Dr. Bobbye Morton had discussed with Dr. Havery Moros with [facility] gastroenterology yesterday.  GI is understandably hesitant to perform endoscopy this admission with probable perforation and giant abscess.  Can consider coordinating in the future ideally  It may be that he will not be able to wait 6 weeks, but he does not seem toxic and a 6-week delay to allow more optimal surgical conditions is not unreasonable at this time.  If he does not improve or declines, may have to proceed with resection this admission, which would involve an open incision  with laparotomy, right colon resection with end ileostomy, drainage of abscesses, open wound with packing and perhaps wound VAC.   In this more urgent setting, there are significant risks of fascial dehiscence with dehiscence/hernia, abscess, ostomy, ureteral injury, positive margin and suboptimal  oncological resection, vent dependent respiratory failure, and other severe morbidity mortality increased given his numerous concerning comorbidities.  The patient is stable.  There is no evidence of peritonitis, acute abdomen, nor shock.  There is no strong evidence of failure of improvement nor decline with current non-operative management.  There is no need for urgent surgery at the present moment.  We will continue to follow.  VTE prophylaxis- SCDs, etc  OK to anticoagulate from surgery standpoint.  Continue SCDs  Severe anemia.  Iron deficiency.  Probable from chronic GI losses most likely from a colon tumor.  Transfused.  IV dextran given.  Oral iron and multivitamin as well.  I think he otherwise can tolerate an operation, especially if he could be better optimized.  He seems to have somewhat decent performance status.  Lyndel Safe cardiac risk score of 0.2% and pulmonary score for pneumonia of 0.4%.  Defer to internal medicine/TRH need for more aggressive medical or cardiac work-up.  Mobilize as tolerated to help recovery  Disposition:  Disposition:  The patient is from: Home  Anticipate discharge to:  Inpatient Rehab (CIR)  Anticipated Date of Discharge is:  February 14,2052    Barriers to discharge:  Pending Clinical improvement (more likely than not)  Patient currently is NOT MEDICALLY STABLE for discharge from the hospital from a surgery standpoint.      45 minutes spent in review, evaluation, examination, counseling, and coordination of care.   I have reviewed this patient's available data, including medical history, events of note, physical examination and test results as part of my evaluation.  A significant portion of that time was spent in counseling.  Care during the described time interval was provided by me.  11/19/2020    Subjective: (Chief complaint)  Patient feeling better.  Less abdominal groin pain.  Passing gas.  Tolerating water.  Wanting  coffee.  Nurse in room.  Drain placement done yesterday.  Large volume of pus.  Now more c/w old blood with some occasional clots.  Objective:  Vital signs:  Vitals:   11/18/20 1718 11/18/20 1800 11/19/20 0400 11/19/20 1150  BP: 117/83 112/74 127/79 116/74  Pulse: 99 96 89 92  Resp: 13 17 18 16   Temp: 99 F (37.2 C) 99.2 F (37.3 C) 99.1 F (37.3 C) 98.5 F (36.9 C)  TempSrc: Oral Oral Oral Oral  SpO2: 95% 93% 92% 94%  Weight:      Height:        Last BM Date: 11/17/20  Intake/Output   Yesterday:  02/07 0701 - 02/08 0700 In: 2212.2 [P.O.:237; I.V.:1544; IV Piggyback:431.2] Out: 750 [Urine:575; Drains:175] This shift:  Total I/O In: 457 [P.O.:437; Other:20] Out: 800 [Urine:800]  Bowel function:  Flatus: YES  BM:  No  Drain: Serosanguinous   Physical Exam:  General: Pt awake/alert in no acute distress.  Cachectic but bright and alert Eyes: PERRL, normal EOM.  Sclera clear.  No icterus Neuro: CN II-XII intact w/o focal sensory/motor deficits. Lymph: No head/neck/groin lymphadenopathy Psych:  No delerium/psychosis/paranoia.  Oriented x 4 HENT: Normocephalic, Mucus membranes moist.  No thrush Neck: Supple, No tracheal deviation.  No obvious thyromegaly Chest: No pain to chest wall compression.  Good respiratory excursion.  No audible wheezing CV:  Pulses intact.  Regular rhythm.  No major extremity edema MS: Normal AROM mjr joints.  No obvious deformity  Abdomen: Somewhat firm.  Nondistended.  Tenderness at RLQ.  No evidence of peritonitis.  No incarcerated hernias.  GU: Mild right groin discomfort.  No major hernia. Ext:   No deformity.  No mjr edema.  No cyanosis Skin: No petechiae / purpurea.  No major sores.  Warm and dry    Results:   Cultures: Recent Results (from the past 720 hour(s))  SARS Coronavirus 2 by RT PCR (hospital order, performed in [facility] hospital lab) Nasopharyngeal Nasopharyngeal Swab     Status: Abnormal   Collection Time:  11/15/20 12:41 PM   Specimen: Nasopharyngeal Swab  Result Value Ref Range Status   SARS Coronavirus 2 POSITIVE (A) NEGATIVE Final    Comment: RESULT CALLED TO, READ BACK BY AND VERIFIED WITH: E SHAFFER RN 1352 11/15/20 A BROWNING (NOTE) SARS-CoV-2 target nucleic acids are DETECTED  SARS-CoV-2 RNA is generally detectable in upper respiratory specimens  during the acute phase of infection.  Positive results are indicative  of the presence of the identified virus, but do not rule out bacterial infection or co-infection with other pathogens not detected by the test.  Clinical correlation with patient history and  other diagnostic information is necessary to determine patient infection status.  The expected result is negative.  Fact Sheet for Patients:   StrictlyIdeas.no   Fact Sheet for Healthcare Providers:   BankingDealers.co.za    This test is not yet approved or cleared by the [place] FDA and  has been authorized for detection and/or diagnosis of SARS-CoV-2 by FDA under an Emergency Use Authorization (EUA).  This EUA will remain in effect (meaning this t est can be used) for the duration of  the COVID-19 declaration under Section 564(b)(1) of the Act, 21 U.S.C. section 360-bbb-3(b)(1), unless the authorization is terminated or revoked sooner.  Performed at [facility], [place] [address] [place], [place] [address]   Blood Culture (routine x 2)     Status: None (Preliminary result)   Collection Time: 11/15/20  1:52 PM   Specimen: BLOOD RIGHT FOREARM  Result Value Ref Range Status   Specimen Description BLOOD RIGHT FOREARM  Final   Special Requests   Final    BOTTLES DRAWN AEROBIC AND ANAEROBIC Blood Culture adequate volume   Culture   Final    NO GROWTH 4 DAYS Performed at [facility], [place] [address] [place], [place] [address]    Report Status PENDING  Incomplete  Blood Culture (routine x 2)     Status: None  (Preliminary result)   Collection Time: 11/15/20  2:13 PM   Specimen: BLOOD LEFT FOREARM  Result Value Ref Range Status   Specimen Description BLOOD LEFT FOREARM  Final   Special Requests   Final    BOTTLES DRAWN AEROBIC AND ANAEROBIC Blood Culture adequate volume   Culture   Final    NO GROWTH 4 DAYS Performed at [facility], [place] [address] [place], [place] [address]    Report Status PENDING  Incomplete  Aerobic/Anaerobic Culture (surgical/deep wound)     Status: None (Preliminary result)   Collection Time: 11/18/20  4:34 PM   Specimen: Abscess  Result Value Ref Range Status   Specimen Description ABSCESS  Final   Special Requests ABDOMEN  Final   Gram Stain   Final    FEW WBC PRESENT, PREDOMINANTLY PMN MODERATE GRAM  NEGATIVE RODS FEW GRAM POSITIVE COCCI IN PAIRS IN CHAINS FEW GRAM POSITIVE RODS Performed at [facility], [place] [address] [place], [place] [address]    Culture FEW ENTEROBACTER AEROGENES  Final   Report Status PENDING  Incomplete    Labs: Results for orders placed or performed during the hospital encounter of 11/15/20 (from the past 48 hour(s))  Creatinine, urine, random     Status: None   Collection Time: 11/17/20  3:29 PM  Result Value Ref Range   Creatinine, Urine 144.83 mg/dL    Comment: Performed at [facility], [place] [address] [place], [place] [address]  Sodium, urine, random     Status: None   Collection Time: 11/17/20  3:29 PM  Result Value Ref Range   Sodium, Ur 148 mmol/L    Comment: Performed at [facility] [address], [place], [place] [address]  CBC with Differential/Platelet     Status: Abnormal   Collection Time: 11/18/20  4:08 AM  Result Value Ref Range   WBC 20.8 (H) 4.0 - 10.5 K/uL   RBC 3.68 (L) 4.22 - 5.81 MIL/uL   Hemoglobin 7.6 (L) 13.0 - 17.0 g/dL    Comment: Reticulocyte Hemoglobin testing may be clinically indicated, consider ordering this additional test [id number]    HCT 25.0 (L) 39.0 - 52.0 %    MCV 67.9 (L) 80.0 - 100.0 fL   MCH 20.7 (L) 26.0 - 34.0 pg   MCHC 30.4 30.0 - 36.0 g/dL   RDW 27.2 (H) 11.5 - 15.5 %   Platelets 818 (H) 150 - 400 K/uL   nRBC 0.0 0.0 - 0.2 %   Neutrophils Relative % 84 %   Neutro Abs 17.5 (H) 1.7 - 7.7 K/uL   Lymphocytes Relative 8 %   Lymphs Abs 1.6 0.7 - 4.0 K/uL   Monocytes Relative 6 %   Monocytes Absolute 1.3 (H) 0.1 - 1.0 K/uL   Eosinophils Relative 0 %   Eosinophils Absolute 0.0 0.0 - 0.5 K/uL   Basophils Relative 0 %   Basophils Absolute 0.1 0.0 - 0.1 K/uL   Immature Granulocytes 2 %   Abs Immature Granulocytes 0.32 (H) 0.00 - 0.07 K/uL    Comment: Performed at [facility] [name] [place],  [address]  Comprehensive metabolic panel     Status: Abnormal   Collection Time: 11/18/20  4:08 AM  Result Value Ref Range   Sodium 124 (L) 135 - 145 mmol/L   Potassium 3.8 3.5 - 5.1 mmol/L   Chloride 94 (L) 98 - 111 mmol/L   CO2 22 22 - 32 mmol/L   Glucose, Bld 123 (H) 70 - 99 mg/dL    Comment: Glucose reference range applies only to samples taken after fasting for at least 8 hours.   BUN 11 6 - 20 mg/dL   Creatinine, Ser 0.77 0.61 - 1.24 mg/dL   Calcium 7.8 (L) 8.9 - 10.3 mg/dL   Total Protein 5.8 (L) 6.5 - 8.1 g/dL   Albumin 1.5 (L) 3.5 - 5.0 g/dL   AST 14 (L) 15 - 41 U/L   ALT 14 0 - 44 U/L   Alkaline Phosphatase 71 38 - 126 U/L   Total Bilirubin 0.4 0.3 - 1.2 mg/dL   GFR, Estimated >60 >60 mL/min    Comment: (NOTE) Calculated using the CKD-EPI Creatinine Equation (2021)    Anion gap 8 5 - 15    Comment: Performed at [facility], [place] [address] [place], [place] [address]  D-dimer, quantitative (not at [facility])     Status: Abnormal   Collection Time: 11/18/20  4:08 AM  Result Value Ref Range   D-Dimer, Quant 3.53 (H) 0.00 - 0.50 ug/mL-FEU    Comment: (NOTE) At the manufacturer cut-off value of 0.5 g/mL FEU, this assay has a negative predictive value of 95-100%.This assay is intended for use in  conjunction with a clinical pretest probability (PTP) assessment model to exclude pulmonary embolism (PE) and deep venous thrombosis (DVT) in outpatients suspected of PE or DVT. Results should be correlated with clinical presentation. Performed at [facility], [place] [address] [place], [place] [address]   Ferritin     Status: None   Collection Time: 11/18/20  4:08 AM  Result Value Ref Range   Ferritin 137 24 - 336 ng/mL    Comment: Performed at [facility], [place] [address] [place], [place] [address]  Magnesium     Status: None   Collection Time: 11/18/20  4:08 AM  Result Value Ref Range   Magnesium 1.9 1.7 - 2.4 mg/dL    Comment: Performed at [facility] [address] [place], [place] [address]  Phosphorus     Status: None   Collection Time: 11/18/20  4:08 AM  Result Value Ref Range   Phosphorus 3.5 2.5 - 4.6 mg/dL    Comment: Performed at [facility] [address], [place], [place] [address]  C-reactive protein     Status: Abnormal   Collection Time: 11/18/20  4:08 AM  Result Value Ref Range   CRP 20.2 (H) <1.0 mg/dL    Comment: Performed at [facility], [place] [address], [place], [place] [address]  Procalcitonin     Status: None   Collection Time: 11/18/20  4:08 AM  Result Value Ref Range   Procalcitonin 11.10 ng/mL    Comment:        Interpretation: PCT >= 10 ng/mL: Important systemic inflammatory response, almost exclusively due to severe bacterial sepsis or septic shock. (NOTE)       Sepsis PCT Algorithm           Lower Respiratory Tract                                      Infection PCT Algorithm    ----------------------------     ----------------------------         PCT < 0.25 ng/mL                PCT < 0.10 ng/mL          Strongly encourage             Strongly discourage   discontinuation of antibiotics    initiation of antibiotics    ----------------------------     -----------------------------       PCT 0.25 - 0.50 ng/mL             PCT 0.10 - 0.25 ng/mL               OR       >80% decrease in PCT            Discourage initiation of  antibiotics      Encourage discontinuation           of antibiotics    ----------------------------     -----------------------------         PCT >= 0.50 ng/mL              PCT 0.26 - 0.50 ng/mL                AND       <80% decrease in PCT             Encourage initiation of                                             antibiotics       Encourage continuation           of antibiotics    ----------------------------     -----------------------------        PCT >= 0.50 ng/mL                  PCT > 0.50 ng/mL               AND         increase in PCT                  Strongly encourage                                      initiation of antibiotics    Strongly encourage escalation           of antibiotics                                     -----------------------------                                           PCT <= 0.25 ng/mL                                                 OR                                        > 80% decrease in PCT                                      Discontinue / Do not initiate                                             antibiotics  Performed at [facility], [place] [address], [place], [place] [address]   Osmolality, urine     Status: None   Collection Time:  11/18/20 12:35 PM  Result Value Ref Range   Osmolality, Ur 409 300 - 900 mOsm/kg    Comment: Performed at [facility] [address], [place], [place] [address]  Prealbumin     Status: Abnormal   Collection Time: 11/18/20  1:00 PM  Result Value Ref Range   Prealbumin 5.7 (L) 18 - 38 mg/dL    Comment: Performed at [facility] [address], [place], [place] [address]  Glucose, capillary     Status: Abnormal   Collection Time: 11/18/20  2:31 PM  Result Value Ref Range   Glucose-Capillary 113 (H) 70 - 99 mg/dL    Comment:  Glucose reference range applies only to samples taken after fasting for at least 8 hours.  Osmolality     Status: Abnormal   Collection Time: 11/18/20  2:44 PM  Result Value Ref Range   Osmolality 262 (L) 275 - 295 mOsm/kg    Comment: REPEATED TO VERIFY Performed at [facility], [place] [address] [place], [place] [address]   TSH     Status: None   Collection Time: 11/18/20  2:44 PM  Result Value Ref Range   TSH 3.642 0.350 - 4.500 uIU/mL    Comment: Performed by a 3rd Generation assay with a functional sensitivity of <=0.01 uIU/mL. Performed at [facility], [place]  [address] [place], [place] [address]   Aerobic/Anaerobic Culture (surgical/deep wound)     Status: None (Preliminary result)   Collection Time: 11/18/20  4:34 PM   Specimen: Abscess  Result Value Ref Range   Specimen Description ABSCESS    Special Requests ABDOMEN    Gram Stain      FEW WBC PRESENT, PREDOMINANTLY PMN MODERATE GRAM NEGATIVE RODS FEW GRAM POSITIVE COCCI IN PAIRS IN CHAINS FEW GRAM POSITIVE RODS Performed at [facility], [place] [address] [place], [place] [address]    Culture FEW ENTEROBACTER AEROGENES    Report Status PENDING   Glucose, capillary     Status: None   Collection Time: 11/18/20  4:39 PM  Result Value Ref Range   Glucose-Capillary 95 70 - 99 mg/dL    Comment: Glucose reference range applies only to samples taken after fasting for at least 8 hours.  Glucose, capillary     Status: Abnormal   Collection Time: 11/18/20  9:18 PM  Result Value Ref Range   Glucose-Capillary 170 (H) 70 - 99 mg/dL    Comment: Glucose reference range applies only to samples taken after fasting for at least 8 hours.  Sodium     Status: Abnormal   Collection Time: 11/18/20  9:42 PM  Result Value Ref Range   Sodium 128 (L) 135 - 145 mmol/L    Comment: Performed at [facility], [place] [address], [place], [place] [address]  Glucose, capillary     Status: Abnormal   Collection Time: 11/19/20  12:28 AM  Result Value Ref Range   Glucose-Capillary 126 (H) 70 - 99 mg/dL    Comment: Glucose reference range applies only to samples taken after fasting for at least 8 hours.  Glucose, capillary     Status: Abnormal   Collection Time: 11/19/20  4:05 AM  Result Value Ref Range   Glucose-Capillary 128 (H) 70 - 99 mg/dL    Comment: Glucose reference range applies only to samples taken after fasting for at least 8 hours.  CBC with Differential/Platelet     Status: Abnormal  Collection Time: 11/19/20  5:00 AM  Result Value Ref Range   WBC 15.7 (H) 4.0 - 10.5 K/uL   RBC 3.38 (L) 4.22 - 5.81 MIL/uL   Hemoglobin 7.2 (L) 13.0 - 17.0 g/dL    Comment: Reticulocyte Hemoglobin testing may be clinically indicated, consider ordering this additional test [id number]    HCT 23.1 (L) 39.0 - 52.0 %   MCV 68.3 (L) 80.0 - 100.0 fL   MCH 21.3 (L) 26.0 - 34.0 pg   MCHC 31.2 30.0 - 36.0 g/dL   RDW 28.0 (H) 11.5 - 15.5 %   Platelets 860 (H) 150 - 400 K/uL    Comment: REPEATED TO VERIFY   nRBC 0.0 0.0 - 0.2 %   Neutrophils Relative % 83 %   Neutro Abs 13.1 (H) 1.7 - 7.7 K/uL   Lymphocytes Relative 9 %   Lymphs Abs 1.4 0.7 - 4.0 K/uL   Monocytes Relative 6 %   Monocytes Absolute 0.9 0.1 - 1.0 K/uL   Eosinophils Relative 0 %   Eosinophils Absolute 0.0 0.0 - 0.5 K/uL   Basophils Relative 0 %   Basophils Absolute 0.0 0.0 - 0.1 K/uL   Immature Granulocytes 2 %   Abs Immature Granulocytes 0.29 (H) 0.00 - 0.07 K/uL   Polychromasia PRESENT     Comment: Performed at [facility], [address], [place], [place] [address]  Comprehensive metabolic panel     Status: Abnormal   Collection Time: 11/19/20  5:00 AM  Result Value Ref Range   Sodium 128 (L) 135 - 145 mmol/L   Potassium 4.0 3.5 - 5.1 mmol/L   Chloride 97 (L) 98 - 111 mmol/L   CO2 22 22 - 32 mmol/L   Glucose, Bld 124 (H) 70 - 99 mg/dL    Comment: Glucose reference range applies only to samples taken after fasting for at least 8 hours.    BUN 13 6 - 20 mg/dL   Creatinine, Ser 0.68 0.61 - 1.24 mg/dL   Calcium 7.7 (L) 8.9 - 10.3 mg/dL   Total Protein 5.5 (L) 6.5 - 8.1 g/dL   Albumin 1.5 (L) 3.5 - 5.0 g/dL   AST 24 15 - 41 U/L   ALT 17 0 - 44 U/L   Alkaline Phosphatase 76 38 - 126 U/L   Total Bilirubin 0.5 0.3 - 1.2 mg/dL   GFR, Estimated >60 >60 mL/min    Comment: (NOTE) Calculated using the CKD-EPI Creatinine Equation (2021)    Anion gap 9 5 - 15    Comment: Performed at [facility], [place] [address], [place], [place] [address]  D-dimer, quantitative (not at [facility])     Status: Abnormal   Collection Time: 11/19/20  5:00 AM  Result Value Ref Range   D-Dimer, Quant 2.77 (H) 0.00 - 0.50 ug/mL-FEU    Comment: (NOTE) At the manufacturer cut-off value of 0.5 g/mL FEU, this assay has a negative predictive value of 95-100%.This assay is intended for use in conjunction with a clinical pretest probability (PTP) assessment model to exclude pulmonary embolism (PE) and deep venous thrombosis (DVT) in outpatients suspected of PE or DVT. Results should be correlated with clinical presentation. Performed at [facility], [place] [address] [place], [place] [address]   Ferritin     Status: None   Collection Time: 11/19/20  5:00 AM  Result Value Ref Range   Ferritin 127 24 - 336 ng/mL    Comment: Performed at [facility], [address] [place], [place] [address]  Magnesium     Status: None   Collection Time: 11/19/20  5:00 AM  Result Value Ref Range   Magnesium 2.2 1.7 - 2.4 mg/dL    Comment: Performed at [facility], [place] [address] [place], [place] [address]  Phosphorus     Status: None   Collection Time: 11/19/20  5:00 AM  Result Value Ref Range   Phosphorus 3.2 2.5 - 4.6 mg/dL    Comment: Performed at [facility] [address] [place], [place] [address]  C-reactive protein     Status: Abnormal   Collection Time: 11/19/20  5:00 AM  Result Value Ref Range   CRP 19.9 (H) <1.0 mg/dL     Comment: Performed at [facility] [address] [place], [place] [address]  Prealbumin     Status: Abnormal   Collection Time: 11/19/20  5:00 AM  Result Value Ref Range   Prealbumin 5.8 (L) 18 - 38 mg/dL    Comment: Performed at [facility] [address], [place], [place] [address]  Cortisol-am, blood     Status: None   Collection Time: 11/19/20  5:00 AM  Result Value Ref Range   Cortisol - AM 17.8 6.7 - 22.6 ug/dL    Comment: Performed at [facility], [place] [address] [place], [place] [address]  Triglycerides     Status: None   Collection Time: 11/19/20  8:09 AM  Result Value Ref Range   Triglycerides 94 <150 mg/dL    Comment: Performed at [facility] [address], [place], [place] [address]  Cortisol-am, blood     Status: Abnormal   Collection Time: 11/19/20  8:09 AM  Result Value Ref Range   Cortisol - AM 26.0 (H) 6.7 - 22.6 ug/dL    Comment: Performed at [facility] [address], [place], [place] [address]  Glucose, capillary     Status: Abnormal   Collection Time: 11/19/20  8:58 AM  Result Value Ref Range   Glucose-Capillary 123 (H) 70 - 99 mg/dL    Comment: Glucose reference range applies only to samples taken after fasting for at least 8 hours.  Glucose, capillary     Status: Abnormal   Collection Time: 11/19/20 11:52 AM  Result Value Ref Range   Glucose-Capillary 151 (H) 70 - 99 mg/dL    Comment: Glucose reference range applies only to samples taken after fasting for at least 8 hours.    Imaging / Studies: CT IMAGE GUIDED DRAINAGE BY PERCUTANEOUS CATHETER  Result Date: 11/19/2020 INDICATION: 50 year old male with history of cecal perforation with fluid collection in the right retroperitoneum. Suspected underlying neoplastic etiology. EXAM: CT IMAGE GUIDED DRAINAGE BY PERCUTANEOUS CATHETER COMPARISON:  CT abdomen pelvis from 11/15/2020 MEDICATIONS: The patient is currently admitted to the hospital and receiving intravenous  antibiotics. The antibiotics were administered within an appropriate time frame prior to the initiation of the procedure. ANESTHESIA/SEDATION: Moderate (conscious) sedation was employed during this procedure. A total of Versed 1 mg and Fentanyl 25 mcg was administered intravenously. Moderate Sedation Time: 21 minutes. The patient's level of consciousness and vital signs were monitored continuously by radiology nursing throughout the procedure under my direct supervision. CONTRAST:  None COMPLICATIONS: None immediate. PROCEDURE: Informed written consent was obtained from the patient after a discussion of the risks, benefits and alternatives to treatment. The patient was placed supine on the CT gantry  and a pre procedural CT was performed re-demonstrating the known abscess/fluid collection within the right retroperitoneum. The procedure was planned. A timeout was performed prior to the initiation of the procedure. The right lower quadrant was prepped and draped in the usual sterile fashion. The overlying soft tissues were anesthetized with 1% lidocaine with epinephrine. Appropriate trajectory was planned with the use of a 22 gauge spinal needle. An 18 gauge trocar needle was advanced into the abscess/fluid collection and a short Amplatz super stiff wire was coiled within the collection. Appropriate positioning was confirmed with a limited CT scan. The tract was serially dilated allowing placement of a 14 French all-purpose drainage catheter. Appropriate positioning was confirmed with a limited postprocedural CT scan. Approximately 400 mL ml of purulent fluid was aspirated. The tube was connected to a bulb suction and sutured in place. A dressing was placed. The patient tolerated the procedure well without immediate post procedural complication. IMPRESSION: Successful CT guided placement of a 46 French all purpose drain catheter into the right retroperitoneum with aspiration of approximately 400 mL of purulent fluid.  Samples were sent to the laboratory for culture as well as cytology given suspicion for underlying neoplastic etiology. Ruthann Cancer, MD [facility] Radiology Electronically Signed   By: Ruthann Cancer MD   On: 11/19/2020 08:01   [place] EKG SITE RITE  Result Date: 11/18/2020 If Site Rite image not attached, placement could not be confirmed due to current cardiac rhythm.   Medications / Allergies: per chart  Antibiotics: Anti-infectives (From admission, onward)   Start     Dose/Rate Route Frequency Ordered Stop   11/16/20 1000  remdesivir 100 mg in sodium chloride 0.9 % 100 mL IVPB       "Followed by" Linked Group Details   100 mg 200 mL/hr over 30 Minutes Intravenous Daily 11/15/20 2135 11/17/20 1002   11/15/20 2230  piperacillin-tazobactam (ZOSYN) IVPB 3.375 g        3.375 g 12.5 mL/hr over 240 Minutes Intravenous Every 8 hours 11/15/20 2144     11/15/20 2134  remdesivir 200 mg in sodium chloride 0.9% 250 mL IVPB       "Followed by" Linked Group Details   200 mg 580 mL/hr over 30 Minutes Intravenous Once 11/15/20 2135 11/16/20 0029   11/15/20 1700  piperacillin-tazobactam (ZOSYN) IVPB 3.375 g        3.375 g 100 mL/hr over 30 Minutes Intravenous  Once 11/15/20 1657 11/15/20 1826        Note: Portions of this report may have been transcribed using voice recognition software. Every effort was made to ensure accuracy; however, inadvertent computerized transcription errors may be present.   Any transcriptional errors that result from this process are unintentional.    Adin Hector, MD, FACS, MASCRS Gastrointestinal and Minimally Invasive Surgery  [facility] Surgery [address] [place], [place] [address] [phone] Fax [phone] Main/Paging  CONTACT INFORMATION: Weekday (9AM-5PM) concerns: Call [facility] main office at [phone] Weeknight (5PM-9AM) or Weekend/Holiday concerns: Check [email or web address] for  General Surgery [facility] coverage (Please, do not use SecureChat as it is not reliable communication to operating surgeons for immediate patient care)      11/19/2020  1:26 PM

## 2020-11-19 NOTE — Progress Notes (Signed)
Palliative:  HPI: 50 y.o. 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 Also with incidental finding of COVID+ infection.   I met today with Eric [name]. He has drain placed and reports that his pain almost instantly went away. Reports pain down from 10/10 to 1/10 and he is very pleased. We discussed updated plan to hold on a big surgery for now with hopes that the drain and antibiotics will help him to improve and clear infection before having to have surgical intervention. Still making plans to work up and obtain biopsy to better know what we are dealing with so we can develop a treatment plan for him. Eric [name] understands. I encouraged him to be patient with [place] as he will need to be monitored carefully to ensure he is improving and expect he will need to continue to be hospitalized for a while longer. Eric [name] is just happy that his pain is relieved.   I called and updated sister, Eric [name], regarding the plan to hold off on surgery and why. Discussed that the medical team is developing a plan to work up and obtain biopsy to have more information. Explained situation and plans.   All questions/concerns addressed. Emotional support provided.   Exam: Alert, oriented. No distress. Temporal muscle wasting. Breathing regular, unlabored. Abd soft, flat. R abd drain in place with bloody drainage. Moves all extremities.   Plan: - Continue work up for possible underlying malignancy.   [place], NP Palliative Medicine Team Pager [phone] (Please see [email or web address] for schedule) Team Phone [phone]    Greater than 50%  of this time was spent counseling and coordinating care related to the above assessment and plan

## 2020-11-19 NOTE — Progress Notes (Addendum)
   Rounding note via phone Covid +  Spoke to [employer] is c/d/i Flushes easily OP milky brown; 175 cc OP Culture FEW ENTEROBACTER AEROGENES     Will continue to monitor

## 2020-11-19 NOTE — Progress Notes (Addendum)
PHARMACY - TOTAL PARENTERAL NUTRITION CONSULT NOTE   Indication: Perforated right colon into retroperitoneum  Patient Measurements: Height: 6' (182.9 cm) Weight: 69.4 kg (153 lb) IBW/kg (Calculated) : 77.6 TPN AdjBW (KG): 69.4 Body mass index is 20.75 kg/m. Usual Weight: 170 lbs  Assessment: Eric [name] is a 50 y.o. male no known medical history. Patient presented secondary to right groin pain and found to have a retroperitoneal abscess/bowel perforation/cecal mass concerning for neoplasm. Incidental finding of COVID-19 positive status. Surgery following, no surgical interventions at this time. Plans for short term management with drains and IV abx.   Glucose / Insulin: No history of diabetes. No A1c available. CBGs 124-170s since starting TPN. Utilized 7 units / 24 hrs Electrolytes: Na 128. Cl 97. Other electrolytes wnl.  Renal: Scr 0.68 - stable. BUN wnl.  LFTs / TGs: AST/ALT and Tbili wnl. TG 91 (2/4).  Prealbumin / albumin: Prealbumin 5.8. Albumin 1.5.  Intake / Output; MIVF: 0.3 ml/kg/hr. NS @ 100 ml/hr. R JP drain 125 ml/24 hrs.  GI Imaging:  2/4 CT abd: long segment masslike thickening on cecum concerning for neoplasm, R retroperitoneal abscess Surgeries / Procedures:  2/7: R retroperitoneal drain placed Central access: 11/18/20 TPN start date: 11/18/20  Nutritional Goals (per RD recommendation on 2/5): Kcal:  2200-2430; Protein:  110-125; Fluid:  >/= 2.2 L Goal TPN rate is 95 mL/hr (provides 120 g of protein and an average of 2243 kcals per day - with lipids on MWF)  Current Nutrition:  NPO  Plan:  Increase TPN to 70 mL/hr at 1800 - this will provide 1615 kcal and 88g of protein Due to national backorder lipids will only be added MWF - will start on Wed for this patient Electrolytes in TPN: 50mEq/L of Na, 86mEq/L of K, 51mEq/L of Ca, 17mEq/L of Mg, and 15mmol/L of Phos - no changes today. Change Cl:Ac ratio 2:1 Add standard MVI and trace elements to TPN Continue Moderate  q4h SSI and adjust as needed - no changes for now since only one CBG elevated > 120s.  Continue MIVF per Dr. Lonny Prude  Monitor TPN labs daily until stabilized then on Mon/Thurs, repeat electrolytes tomorrow   Cristela Felt, PharmD Clinical Pharmacist  11/19/2020, 7:25 AM

## 2020-11-19 NOTE — Progress Notes (Signed)
Nutrition Follow-up  DOCUMENTATION CODES:   Non-severe (moderate) malnutrition in context of chronic illness  INTERVENTION:  TPN per Pharmacy.  Continue Ensure Enlive po TID as tolerated, each supplement provides 350 kcal and 20 grams of protein  NUTRITION DIAGNOSIS:   Moderate Malnutrition related to  (illness) as evidenced by severe muscle depletion,moderate fat depletion,moderate muscle depletion  GOAL:   Patient will meet greater than or equal to 90% of their needs; progressing  MONITOR:   PO intake,Supplement acceptance,Skin,Weight trends,Labs,I & O's (TPN tolerance)  REASON FOR ASSESSMENT:   Malnutrition Screening Tool    ASSESSMENT:   50 year old male with history significant for tobacco abuse admitted with sepsis due to right retroperitoneal abscess and found to be positive for COVID-19. Pt who has not had any routine medical care for the last 10 years presents with 5 day onset of significant right groin pain that has been progressive and constant associated with chills and night sweats. Patient presented secondary to right groin pain and found to have a retroperitoneal abscess/bowel perforation/cecal mass concerning for neoplasm.  TPN initiated yesterday. Retroperitoneal abscess drain placement by IR yesterday. Diet advanced to a clear liquid today per MD. MD has cleared that pt may have Ensure supplements. Pt reports feeling better s/p drain placement. Pt reports no abdominal pains or discomfort. Ensure has been ordered and pt has been consuming them. RD to continue with current orders. Per Pharmacy, TPN to be increased to 70 ml/hr today. Goal TPN rate to be at 95 ml/hr which will provide 2243 kcal and 120 grams of protein. Plans for lipids to be initiated tomorrow with a MWF schedule.   NUTRITION - FOCUSED PHYSICAL EXAM:  Flowsheet Row Most Recent Value  Orbital Region Moderate depletion  Upper Arm Region Moderate depletion  Thoracic and Lumbar Region Moderate  depletion  Buccal Region Moderate depletion  Temple Region Moderate depletion  Clavicle Bone Region Severe depletion  Clavicle and Acromion Bone Region Moderate depletion  Scapular Bone Region Unable to assess  Dorsal Hand Unable to assess  Patellar Region Moderate depletion  Anterior Thigh Region Moderate depletion  Posterior Calf Region Moderate depletion  Edema (RD Assessment) None  Hair Reviewed  Eyes Reviewed  Mouth Reviewed  Skin Reviewed  Nails Reviewed     Labs and medications reviewed.   Diet Order:   Diet Order            Diet clear liquid Room service appropriate? Yes; Fluid consistency: Thin  Diet effective now                 EDUCATION NEEDS:   Not appropriate for education at this time  Skin:  Skin Assessment: Reviewed RN Assessment  Last BM:  2/6  Height:   Ht Readings from Last 1 Encounters:  11/15/20 6' (1.829 m)    Weight:   Wt Readings from Last 1 Encounters:  11/15/20 69.4 kg   BMI:  Body mass index is 20.75 kg/m.  Estimated Nutritional Needs:   Kcal:  7654-6503  Protein:  110-125  Fluid:  >/= 2.2 L  Corrin Parker, MS, RD, LDN RD pager number/after hours weekend pager number on Amion.

## 2020-11-20 ENCOUNTER — Inpatient Hospital Stay (HOSPITAL_COMMUNITY): Payer: Self-pay

## 2020-11-20 ENCOUNTER — Encounter (HOSPITAL_COMMUNITY): Payer: Self-pay | Admitting: Internal Medicine

## 2020-11-20 DIAGNOSIS — D649 Anemia, unspecified: Secondary | ICD-10-CM

## 2020-11-20 DIAGNOSIS — L0291 Cutaneous abscess, unspecified: Secondary | ICD-10-CM

## 2020-11-20 LAB — CBC WITH DIFFERENTIAL/PLATELET
Abs Immature Granulocytes: 0.26 10*3/uL — ABNORMAL HIGH (ref 0.00–0.07)
Basophils Absolute: 0.1 10*3/uL (ref 0.0–0.1)
Basophils Relative: 0 %
Eosinophils Absolute: 0.1 10*3/uL (ref 0.0–0.5)
Eosinophils Relative: 1 %
HCT: 26 % — ABNORMAL LOW (ref 39.0–52.0)
Hemoglobin: 7.7 g/dL — ABNORMAL LOW (ref 13.0–17.0)
Immature Granulocytes: 2 %
Lymphocytes Relative: 11 %
Lymphs Abs: 1.4 10*3/uL (ref 0.7–4.0)
MCH: 20.9 pg — ABNORMAL LOW (ref 26.0–34.0)
MCHC: 29.6 g/dL — ABNORMAL LOW (ref 30.0–36.0)
MCV: 70.5 fL — ABNORMAL LOW (ref 80.0–100.0)
Monocytes Absolute: 0.7 10*3/uL (ref 0.1–1.0)
Monocytes Relative: 5 %
Neutro Abs: 11.1 10*3/uL — ABNORMAL HIGH (ref 1.7–7.7)
Neutrophils Relative %: 81 %
Platelets: 855 10*3/uL — ABNORMAL HIGH (ref 150–400)
RBC: 3.69 MIL/uL — ABNORMAL LOW (ref 4.22–5.81)
RDW: 28.7 % — ABNORMAL HIGH (ref 11.5–15.5)
WBC: 13.7 10*3/uL — ABNORMAL HIGH (ref 4.0–10.5)
nRBC: 0 % (ref 0.0–0.2)

## 2020-11-20 LAB — CULTURE, BLOOD (ROUTINE X 2)
Culture: NO GROWTH
Culture: NO GROWTH
Special Requests: ADEQUATE
Special Requests: ADEQUATE

## 2020-11-20 LAB — COMPREHENSIVE METABOLIC PANEL
ALT: 27 U/L (ref 0–44)
AST: 36 U/L (ref 15–41)
Albumin: 1.7 g/dL — ABNORMAL LOW (ref 3.5–5.0)
Alkaline Phosphatase: 80 U/L (ref 38–126)
Anion gap: 10 (ref 5–15)
BUN: 10 mg/dL (ref 6–20)
CO2: 20 mmol/L — ABNORMAL LOW (ref 22–32)
Calcium: 8.1 mg/dL — ABNORMAL LOW (ref 8.9–10.3)
Chloride: 100 mmol/L (ref 98–111)
Creatinine, Ser: 0.62 mg/dL (ref 0.61–1.24)
GFR, Estimated: >60 mL/min (ref >60)
Glucose, Bld: 116 mg/dL — ABNORMAL HIGH (ref 70–99)
Potassium: 4.6 mmol/L (ref 3.5–5.1)
Sodium: 130 mmol/L — ABNORMAL LOW (ref 135–145)
Total Bilirubin: 0.4 mg/dL (ref 0.3–1.2)
Total Protein: 6 g/dL — ABNORMAL LOW (ref 6.5–8.1)

## 2020-11-20 LAB — GLUCOSE, CAPILLARY
Glucose-Capillary: 139 mg/dL — ABNORMAL HIGH (ref 70–99)
Glucose-Capillary: 144 mg/dL — ABNORMAL HIGH (ref 70–99)
Glucose-Capillary: 144 mg/dL — ABNORMAL HIGH (ref 70–99)
Glucose-Capillary: 148 mg/dL — ABNORMAL HIGH (ref 70–99)
Glucose-Capillary: 155 mg/dL — ABNORMAL HIGH (ref 70–99)
Glucose-Capillary: 161 mg/dL — ABNORMAL HIGH (ref 70–99)
Glucose-Capillary: 169 mg/dL — ABNORMAL HIGH (ref 70–99)

## 2020-11-20 LAB — SODIUM: Sodium: 129 mmol/L — ABNORMAL LOW (ref 135–145)

## 2020-11-20 LAB — PHOSPHORUS: Phosphorus: 2.9 mg/dL (ref 2.5–4.6)

## 2020-11-20 LAB — CEA: CEA: 0.8 ng/mL (ref 0.0–4.7)

## 2020-11-20 LAB — PREPARE RBC (CROSSMATCH)

## 2020-11-20 LAB — FERRITIN: Ferritin: 270 ng/mL (ref 24–336)

## 2020-11-20 LAB — CYTOLOGY - NON PAP

## 2020-11-20 LAB — D-DIMER, QUANTITATIVE: D-Dimer, Quant: 3.05 ug/mL-FEU — ABNORMAL HIGH (ref 0.00–0.50)

## 2020-11-20 LAB — MAGNESIUM: Magnesium: 2.3 mg/dL (ref 1.7–2.4)

## 2020-11-20 LAB — C-REACTIVE PROTEIN: CRP: 12.9 mg/dL — ABNORMAL HIGH (ref <1.0)

## 2020-11-20 MED ORDER — HEPARIN SODIUM (PORCINE) 5000 UNIT/ML IJ SOLN
5000.0000 [IU] | Freq: Three times a day (TID) | INTRAMUSCULAR | Status: DC
  Administered 2020-11-20: 5000 [IU] via SUBCUTANEOUS
  Filled 2020-11-20: qty 1

## 2020-11-20 MED ORDER — TRAVASOL 10 % IV SOLN
INTRAVENOUS | Status: AC
  Filled 2020-11-20: qty 1200

## 2020-11-20 MED ORDER — MIDAZOLAM HCL 2 MG/2ML IJ SOLN
INTRAMUSCULAR | Status: AC
  Filled 2020-11-20: qty 2

## 2020-11-20 MED ORDER — FENTANYL CITRATE (PF) 100 MCG/2ML IJ SOLN
INTRAMUSCULAR | Status: AC
  Filled 2020-11-20: qty 2

## 2020-11-20 MED ORDER — FENTANYL CITRATE (PF) 100 MCG/2ML IJ SOLN
INTRAMUSCULAR | Status: AC | PRN
  Administered 2020-11-20: 25 ug via INTRAVENOUS

## 2020-11-20 MED ORDER — MIDAZOLAM HCL 2 MG/2ML IJ SOLN
INTRAMUSCULAR | Status: AC | PRN
  Administered 2020-11-20: 1 mg via INTRAVENOUS

## 2020-11-20 MED ORDER — LIDOCAINE HCL (PF) 1 % IJ SOLN
INTRAMUSCULAR | Status: AC | PRN
  Administered 2020-11-20: 6 mL

## 2020-11-20 MED ORDER — LIDOCAINE HCL (PF) 1 % IJ SOLN
INTRAMUSCULAR | Status: AC
  Filled 2020-11-20: qty 30

## 2020-11-20 MED ORDER — SODIUM CHLORIDE 0.9% IV SOLUTION: Freq: Once | INTRAVENOUS | Status: AC

## 2020-11-20 MED ORDER — GADOBUTROL 1 MMOL/ML IV SOLN
7.0000 mL | Freq: Once | INTRAVENOUS | Status: AC | PRN
  Administered 2020-11-20: 7 mL via INTRAVENOUS

## 2020-11-20 MED ORDER — SODIUM CHLORIDE 0.9 % IV SOLN
INTRAVENOUS | Status: AC | PRN
Start: 2020-11-20 — End: 2020-11-20
  Administered 2020-11-20: 1000 mL via INTRAVENOUS

## 2020-11-20 MED ORDER — HEPARIN SODIUM (PORCINE) 5000 UNIT/ML IJ SOLN
5000.0000 [IU] | Freq: Three times a day (TID) | INTRAMUSCULAR | Status: DC
  Administered 2020-11-20 – 2020-11-27 (×22): 5000 [IU] via SUBCUTANEOUS
  Filled 2020-11-20 (×23): qty 1

## 2020-11-20 NOTE — Progress Notes (Signed)
Consult to assess PIV, noted per IV flowsheet pt has TL PICC with 2 lumens capped. Recommended removal of PIV.

## 2020-11-20 NOTE — Progress Notes (Addendum)
MRI of the liver is notable for 3 liver lesions on the right side.  Possible metastatic disease versus abscesses   See if we can have interventional radiology do liver biopsy versus aspiration.  Rule out metastatic sarcoma.  Rule out metastatic right-sided colon adenocarcinoma.  Rule out liver abscesses.    If he has metastatic disease, would recommend medical oncology consultation with palliative chemotherapy and avoid any surgery if possible.  Will be discussed at GI tumor board next week.    Eric Hector, MD, FACS, MASCRS Gastrointestinal and Minimally Invasive Surgery  [facility] Surgery [address] [place], [place] [address] [phone] Fax [phone] Main/Paging  CONTACT INFORMATION: Weekday (9AM-5PM) concerns: Call [facility] main office at [phone] Weeknight (5PM-9AM) or Weekend/Holiday concerns: Check [email or web address] for General Surgery [facility] coverage (Please, do not use SecureChat as it is not reliable communication to operating surgeons for immediate patient care)

## 2020-11-20 NOTE — Progress Notes (Signed)
[facility] for Restarting Anticoagulants/Antiplatelet Drugs Post-IR Procdure Indication: VTE prophylaxis  No Known Allergies  Patient Measurements: Height: 6' (182.9 cm) Weight: 69.4 kg (153 lb) IBW/kg (Calculated) : 77.6  Vital Signs: Temp: 97.7 F (36.5 C) (02/09 1617) Temp Source: Oral (02/09 1617) BP: 113/79 (02/09 1617) Pulse Rate: 82 (02/09 1617)  Labs: Recent Labs    11/18/20 0408 11/19/20 0500 11/19/20 1312 11/20/20 0406  HGB 7.6* 7.2* 7.0* 7.7*  HCT 25.0* 23.1* 25.5* 26.0*  PLT 818* 860*  --  855*  CREATININE 0.77 0.68  --  0.62    Estimated Creatinine Clearance: 107.2 mL/min (by C-G formula based on SCr of 0.62 mg/dL).   Assessment: 50 yr old man admitted on 11/15/20 with retroperitoneal abscess/bowel perforation/cecal mass concerning for neoplasm. Pt had percutaneous drain placed by IR on 2/7; General Surgery is following.   Pt also has evidence of hepatic nodules on U/S; he is S/P biopsy of liver lesion this afternoon by IR. Pharmacy is consulted to restart anticoagulants/antiplatelet medications per [facility] protocol. Per consult text, IR procedure was standard bleeding risk.  Pt was on heparin 5000 units SQ Q 8 hrs for VTE prophylaxis prior to today's IR procedure (last dose at 0838 this AM).   H/H 7.7/26.0, plt 855 (CBC stable)  Per [facility] protocol, restart prophylactic SQ heparin at least 6 hrs after procedure or at next standard dosing interval; procedure note written ~1630 PM today. Per RN, no bleeding issues observed post procedure.  Goal of Therapy:  Prevention of VTE Monitor platelets by anticoagulation protocol: Yes   Plan:  Restart heparin 5000 units SQ Q 8 hrs at 2200 tonight Check CBC with AM labs tomorrow Monitor for bleeding  Gillermina Hu, PharmD, BCPS, [facility] Clinical Pharmacist 11/20/2020,4:28 PM

## 2020-11-20 NOTE — Plan of Care (Signed)
Pt is A/Ox4; VSS; on room air and sats above 95%; denies any complaints on this shift. Pt had MRI performed; tolerated well; CHG bath performed. JP site intact and drained 50 ml this shift. Per pt all needs have been met; bed is locked in lowest position; 3 side rails up; call bell is within reach; continue to monitor and follow care plan.    Problem: Education: Goal: Knowledge of risk factors and measures for prevention of condition will improve Outcome: Progressing   Problem: Coping: Goal: Psychosocial and spiritual needs will be supported Outcome: Progressing   Problem: Respiratory: Goal: Will maintain a patent airway Outcome: Progressing Goal: Complications related to the disease process, condition or treatment will be avoided or minimized Outcome: Progressing   Problem: Education: Goal: Knowledge of General Education information will improve Description: Including pain rating scale, medication(s)/side effects and non-pharmacologic comfort measures Outcome: Progressing   Problem: Health Behavior/Discharge Planning: Goal: Ability to manage health-related needs will improve Outcome: Progressing   Problem: Clinical Measurements: Goal: Ability to maintain clinical measurements within normal limits will improve Outcome: Progressing Goal: Will remain free from infection Outcome: Progressing Goal: Diagnostic test results will improve Outcome: Progressing Goal: Respiratory complications will improve Outcome: Progressing Goal: Cardiovascular complication will be avoided Outcome: Progressing   Problem: Activity: Goal: Risk for activity intolerance will decrease Outcome: Progressing   Problem: Nutrition: Goal: Adequate nutrition will be maintained Outcome: Progressing   Problem: Coping: Goal: Level of anxiety will decrease Outcome: Progressing   Problem: Elimination: Goal: Will not experience complications related to bowel motility Outcome: Progressing Goal: Will not  experience complications related to urinary retention Outcome: Progressing   Problem: Pain Managment: Goal: General experience of comfort will improve Outcome: Progressing   Problem: Safety: Goal: Ability to remain free from injury will improve Outcome: Progressing   Problem: Skin Integrity: Goal: Risk for impaired skin integrity will decrease Outcome: Progressing

## 2020-11-20 NOTE — Progress Notes (Addendum)
PROGRESS NOTE    Eric [name]  NAT:[id number] DOB: 08-Apr-1971 DOA: 11/15/2020 PCP: [facility], [facility]   Brief Narrative: Eric [name] is a 50 y.o. male no known medical history. Patient presented secondary to right groin pain and found to have a retroperitoneal abscess/bowel perforation/cecal mass concerning for neoplasm. Incidental finding of COVID-19 positive status. General surgery consulted on admission.  Subjective: Patient denies any fever, chills, no nausea or vomiting.  Assessment & Plan:   Principal Problem:   Retroperitoneal abscess ([place]) Active Problems:   Colonic mass   Iron deficiency anemia due to chronic blood loss   Sepsis ([place])   COVID-19 virus infection   Thrombocytosis   Hyponatremia   Protein-calorie malnutrition, severe (HCC)   Liver mass, right lobe   Right groin pain   Ambulates with cane   Tobacco abuse   Abdominal pain, RLQ (right lower quadrant)   Fatigue   Symptomatic anemia   Sepsis Present on admission. Secondary to retroperitoneal abscess. Started on IV zosyn. Blood cultures no growth to date. Procalcitonin trending down. Blood cultures with no growth to date.  Retroperitoneal abscess Bowel perforation Started on Zosyn IV. General surgery consulted and are following. Lyndel Safe cardiac risk score of 0.2% and pulmonary score for pneumonia of 0.4%. Patient has no known cardiac/pulmonary disease. -General surgery recommendations: antibiotics, percutaneous drain per IR placed on 2/7, TPN -Follow-up wound culture -Evidence of hepatic nodules on ultrasound, metastasis versus abscess, IR to perform aspiration versus biopsy today we will hold subcu heparin for now.  Colonic mass Concern for neoplasm in setting of above.  Thrombocytosis Platelets of 1165k on admission. Likely reactive. Improving.  COVID-19 infection Incidental. Asymptomatic. On room air. Started on Remdesivir and completed three doses.  Leukocytosis Secondary to  above infection/perforation. Trended up slightly  Iron deficiency anemia Possibly related to colonic mass. Patient with a hemoglobin of 5.4 on admission and given 2 units of PRBC. Hemoglobin trending down slightly.  Will give another 2 units PRBC today.  Liver lesion -Plan for IR either to drain or biopsy  Hyponatremia Mild, symptomatic  Tobacco abuse Smoking cessation discussed. -Continue nicotine    DVT prophylaxis: SCDs, will start on subcu heparin after procedures. Code Status:   Code Status: Full Code Family Communication: None at bedside, I have discussed with patient at length and answered all his questions. Disposition Plan: Discharge likely in several days pending general surgery recommendations for management.   Consultants:   General surgery  Procedures:   None  Antimicrobials:  Zosyn IV      Objective: Vitals:   11/20/20 0837 11/20/20 1442 11/20/20 1515 11/20/20 1520  BP: 124/80 115/83 113/79 110/77  Pulse: 96 85 84 81  Resp: 16 20 (!) 21 (!) 21  Temp:      TempSrc:      SpO2: 97% 96% 99% 99%  Weight:      Height:        Intake/Output Summary (Last 24 hours) at 11/20/2020 1528 Last data filed at 11/20/2020 1526 Gross per 24 hour  Intake 468.13 ml  Output 3500 ml  Net -3031.87 ml   Filed Weights   11/15/20 2000  Weight: 69.4 kg    Examination:  Awake Alert, Oriented X 3, No new F.N deficits, Normal affect Symmetrical Chest wall movement, Good air movement bilaterally, CTAB RRR,No Gallops,Rubs or new Murmurs, No Parasternal Heave +ve B.Sounds, Abd Soft, mild right lower quadrant tenderness, with right flank JP drain with sanguinous fluid No Cyanosis, Clubbing  or edema, No new Rash or bruise      Data Reviewed: I have personally reviewed following labs and imaging studies  CBC Lab Results  Component Value Date   WBC 13.7 (H) 11/20/2020   RBC 3.69 (L) 11/20/2020   HGB 7.7 (L) 11/20/2020   HCT 26.0 (L) 11/20/2020   MCV 70.5 (L)  11/20/2020   MCH 20.9 (L) 11/20/2020   PLT 855 (H) 11/20/2020   MCHC 29.6 (L) 11/20/2020   RDW 28.7 (H) 11/20/2020   LYMPHSABS 1.4 11/20/2020   MONOABS 0.7 11/20/2020   EOSABS 0.1 11/20/2020   BASOSABS 0.1 35/46/5681     Last metabolic panel Lab Results  Component Value Date   NA 129 (L) 11/20/2020   K 4.6 11/20/2020   CL 100 11/20/2020   CO2 20 (L) 11/20/2020   BUN 10 11/20/2020   CREATININE 0.62 11/20/2020   GLUCOSE 116 (H) 11/20/2020   GFRNONAA >60 11/20/2020   CALCIUM 8.1 (L) 11/20/2020   PHOS 2.9 11/20/2020   PROT 6.0 (L) 11/20/2020   ALBUMIN 1.7 (L) 11/20/2020   BILITOT 0.4 11/20/2020   ALKPHOS 80 11/20/2020   AST 36 11/20/2020   ALT 27 11/20/2020   ANIONGAP 10 11/20/2020    CBG (last 3)  Recent Labs    11/20/20 0346 11/20/20 0801 11/20/20 1146  GLUCAP 155* 144* 148*     GFR: Estimated Creatinine Clearance: 107.2 mL/min (by C-G formula based on SCr of 0.62 mg/dL).  Coagulation Profile: Recent Labs  Lab 11/15/20 1520  INR 1.2    Recent Results (from the past 240 hour(s))  SARS Coronavirus 2 by RT PCR (hospital order, performed in [facility] hospital lab) Nasopharyngeal Nasopharyngeal Swab     Status: Abnormal   Collection Time: 11/15/20 12:41 PM   Specimen: Nasopharyngeal Swab  Result Value Ref Range Status   SARS Coronavirus 2 POSITIVE (A) NEGATIVE Final    Comment: RESULT CALLED TO, READ BACK BY AND VERIFIED WITH: E SHAFFER RN 1352 11/15/20 A BROWNING (NOTE) SARS-CoV-2 target nucleic acids are DETECTED  SARS-CoV-2 RNA is generally detectable in upper respiratory specimens  during the acute phase of infection.  Positive results are indicative  of the presence of the identified virus, but do not rule out bacterial infection or co-infection with other pathogens not detected by the test.  Clinical correlation with patient history and  other diagnostic information is necessary to determine patient infection status.  The expected result is  negative.  Fact Sheet for Patients:   StrictlyIdeas.no   Fact Sheet for Healthcare Providers:   BankingDealers.co.za    This test is not yet approved or cleared by the [place] FDA and  has been authorized for detection and/or diagnosis of SARS-CoV-2 by FDA under an Emergency Use Authorization (EUA).  This EUA will remain in effect (meaning this t est can be used) for the duration of  the COVID-19 declaration under Section 564(b)(1) of the Act, 21 U.S.C. section 360-bbb-3(b)(1), unless the authorization is terminated or revoked sooner.  Performed at [facility], [place] [address] [place], [place] [address]   Blood Culture (routine x 2)     Status: None   Collection Time: 11/15/20  1:52 PM   Specimen: BLOOD RIGHT FOREARM  Result Value Ref Range Status   Specimen Description BLOOD RIGHT FOREARM  Final   Special Requests   Final    BOTTLES DRAWN AEROBIC AND ANAEROBIC Blood Culture adequate volume   Culture   Final  NO GROWTH 5 DAYS Performed at [facility], [place] [address] [place],  [place] [address]    Report Status 11/20/2020 FINAL  Final  Blood Culture (routine x 2)     Status: None   Collection Time: 11/15/20  2:13 PM   Specimen: BLOOD LEFT FOREARM  Result Value Ref Range Status   Specimen Description BLOOD LEFT FOREARM  Final   Special Requests   Final    BOTTLES DRAWN AEROBIC AND ANAEROBIC Blood Culture adequate volume   Culture   Final    NO GROWTH 5 DAYS Performed at [facility], [place] [address], [place], [place] [address]    Report Status 11/20/2020 FINAL  Final  Aerobic/Anaerobic Culture (surgical/deep wound)     Status: None (Preliminary result)   Collection Time: 11/18/20  4:34 PM   Specimen: Abscess  Result Value Ref Range Status   Specimen Description ABSCESS  Final   Special Requests ABDOMEN  Final   Gram Stain   Final    FEW WBC PRESENT, PREDOMINANTLY PMN MODERATE GRAM NEGATIVE  RODS FEW GRAM POSITIVE COCCI IN PAIRS IN CHAINS FEW GRAM POSITIVE RODS    Culture   Final    FEW ENTEROBACTER AEROGENES MODERATE SUSCEPTIBILITIES TO FOLLOW Beta hemolytic streptococci are predictably susceptible to penicillin and other beta lactams. Susceptibility testing not routinely performed. HOLDING FOR POSSIBLE ANAEROBE Performed at [facility], [place] [address] [place], [place] [address]    Report Status PENDING  Incomplete   Organism ID, Bacteria ENTEROBACTER AEROGENES  Final      Susceptibility   Enterobacter aerogenes - MIC*    CEFAZOLIN >=64 RESISTANT Resistant     CEFEPIME <=0.12 SENSITIVE Sensitive     CEFTAZIDIME <=1 SENSITIVE Sensitive     CEFTRIAXONE <=0.25 SENSITIVE Sensitive     CIPROFLOXACIN <=0.25 SENSITIVE Sensitive     GENTAMICIN <=1 SENSITIVE Sensitive     IMIPENEM 0.5 SENSITIVE Sensitive     TRIMETH/SULFA <=20 SENSITIVE Sensitive     PIP/TAZO <=4 SENSITIVE Sensitive     * FEW ENTEROBACTER AEROGENES        Radiology Studies: MR PELVIS W WO CONTRAST  Result Date: 11/20/2020 CLINICAL DATA:  History of pelvic abscess. 3.5 cm liver lesion on CT. Scrotal pain. EXAM: MRI ABDOMEN AND PELVIS WITHOUT AND WITH CONTRAST TECHNIQUE: Multiplanar multisequence MR imaging of the abdomen and pelvis was performed both before and after the administration of intravenous contrast. CONTRAST:  59mL GADAVIST GADOBUTROL 1 MMOL/ML IV SOLN COMPARISON:  CT scan 11/15/2020. FINDINGS: COMBINED FINDINGS FOR BOTH MR ABDOMEN AND PELVIS Lower chest: Unremarkable. Hepatobiliary: As noted on recent CT scan, there is a 3.5 x 2.5 cm heterogeneous lesion in the inferior right liver peripherally. This shows irregular peripheral and heterogeneous central enhancement after IV contrast administration. T2 imaging shows a surrounding halo of edema. No substantial restricted diffusion. A similar lesion is identified in the medial subcapsular right liver (segment VI) measuring 1.9 x 2.1 cm (see  postcontrast image 48 of series 26). 1 additional irregular rim enhancing lesion is seen in the inferior left liver (segment IV B) visible on image 59 of series 26. 17 mm simple cyst identified lateral segment left liver. Several additional 3-4 mm hypoenhancing foci are seen scattered in the liver parenchyma, much too small to characterize but likely benign and may be tiny cysts or biliary hamartomas. There is no evidence for gallstones, gallbladder wall thickening, or pericholecystic fluid. Probable adenomyomatosis at the fundus. No intrahepatic or extrahepatic  biliary dilation. Pancreas: No focal mass lesion. No dilatation of the main duct. No intraparenchymal cyst. No peripancreatic edema. Spleen:  No splenomegaly. No focal mass lesion. Adrenals/Urinary Tract: No adrenal nodule or mass. Kidneys unremarkable. No hydroureter. Bladder unremarkable. Stomach/Bowel: Stomach is unremarkable. No gastric wall thickening. No evidence of outlet obstruction. Duodenum is normally positioned as is the ligament of Treitz. Mild diffuse distention of small bowel evident. Marked wall thickening again noted in the cecum. Colon is diffusely distended and stool-filled as before. Vascular/Lymphatic: No abdominal aortic aneurysm There is no gastrohepatic or hepatoduodenal ligament lymphadenopathy. No retroperitoneal or mesenteric lymphadenopathy. No pelvic sidewall lymphadenopathy. Reproductive: The prostate gland and seminal vesicles are unremarkable. Although not a dedicated scrotal exam, there is no evidence for testicular mass lesion or hydrocele. Other:  No substantial intraperitoneal free fluid. Musculoskeletal: Right iliopsoas abscess again noted with percutaneous drainage catheter now in place with distal loop formed in the psoas component of the process. Interval decrease in size of the rim enhancing fluid collection seen extending down anterior to the right hip as before. No suspicious focal marrow abnormality. IMPRESSION: 1.  3.5 x 2.5 cm heterogeneous enhancing lesion in the inferior peripheral right liver with 2 additional smaller lesions having similar imaging features. Given the large right retroperitoneal abscess, intrahepatic abscesses would be a distinct consideration. Metastatic disease also a consideration. Tissue sampling likely warranted. 2. Interval decrease in size of the rim enhancing right iliopsoas fluid collection status post percutaneous drain placement. 3. Marked wall thickening again noted in the cecum with diffuse distention of small bowel loops. These findings are better characterized on the recent CT scan. Electronically Signed   By: Misty Stanley M.D.   On: 11/20/2020 05:53   MR ABDOMEN W WO CONTRAST  Result Date: 11/20/2020 CLINICAL DATA:  History of pelvic abscess. 3.5 cm liver lesion on CT. Scrotal pain. EXAM: MRI ABDOMEN AND PELVIS WITHOUT AND WITH CONTRAST TECHNIQUE: Multiplanar multisequence MR imaging of the abdomen and pelvis was performed both before and after the administration of intravenous contrast. CONTRAST:  28mL GADAVIST GADOBUTROL 1 MMOL/ML IV SOLN COMPARISON:  CT scan 11/15/2020. FINDINGS: COMBINED FINDINGS FOR BOTH MR ABDOMEN AND PELVIS Lower chest: Unremarkable. Hepatobiliary: As noted on recent CT scan, there is a 3.5 x 2.5 cm heterogeneous lesion in the inferior right liver peripherally. This shows irregular peripheral and heterogeneous central enhancement after IV contrast administration. T2 imaging shows a surrounding halo of edema. No substantial restricted diffusion. A similar lesion is identified in the medial subcapsular right liver (segment VI) measuring 1.9 x 2.1 cm (see postcontrast image 48 of series 26). 1 additional irregular rim enhancing lesion is seen in the inferior left liver (segment IV B) visible on image 59 of series 26. 17 mm simple cyst identified lateral segment left liver. Several additional 3-4 mm hypoenhancing foci are seen scattered in the liver parenchyma, much  too small to characterize but likely benign and may be tiny cysts or biliary hamartomas. There is no evidence for gallstones, gallbladder wall thickening, or pericholecystic fluid. Probable adenomyomatosis at the fundus. No intrahepatic or extrahepatic biliary dilation. Pancreas: No focal mass lesion. No dilatation of the main duct. No intraparenchymal cyst. No peripancreatic edema. Spleen:  No splenomegaly. No focal mass lesion. Adrenals/Urinary Tract: No adrenal nodule or mass. Kidneys unremarkable. No hydroureter. Bladder unremarkable. Stomach/Bowel: Stomach is unremarkable. No gastric wall thickening. No evidence of outlet obstruction. Duodenum is normally positioned as is the ligament of Treitz. Mild diffuse distention of small bowel evident.  Marked wall thickening again noted in the cecum. Colon is diffusely distended and stool-filled as before. Vascular/Lymphatic: No abdominal aortic aneurysm There is no gastrohepatic or hepatoduodenal ligament lymphadenopathy. No retroperitoneal or mesenteric lymphadenopathy. No pelvic sidewall lymphadenopathy. Reproductive: The prostate gland and seminal vesicles are unremarkable. Although not a dedicated scrotal exam, there is no evidence for testicular mass lesion or hydrocele. Other:  No substantial intraperitoneal free fluid. Musculoskeletal: Right iliopsoas abscess again noted with percutaneous drainage catheter now in place with distal loop formed in the psoas component of the process. Interval decrease in size of the rim enhancing fluid collection seen extending down anterior to the right hip as before. No suspicious focal marrow abnormality. IMPRESSION: 1. 3.5 x 2.5 cm heterogeneous enhancing lesion in the inferior peripheral right liver with 2 additional smaller lesions having similar imaging features. Given the large right retroperitoneal abscess, intrahepatic abscesses would be a distinct consideration. Metastatic disease also a consideration. Tissue sampling  likely warranted. 2. Interval decrease in size of the rim enhancing right iliopsoas fluid collection status post percutaneous drain placement. 3. Marked wall thickening again noted in the cecum with diffuse distention of small bowel loops. These findings are better characterized on the recent CT scan. Electronically Signed   By: Misty Stanley M.D.   On: 11/20/2020 05:53   CT IMAGE GUIDED DRAINAGE BY PERCUTANEOUS CATHETER  Result Date: 11/19/2020 INDICATION: 50 year old male with history of cecal perforation with fluid collection in the right retroperitoneum. Suspected underlying neoplastic etiology. EXAM: CT IMAGE GUIDED DRAINAGE BY PERCUTANEOUS CATHETER COMPARISON:  CT abdomen pelvis from 11/15/2020 MEDICATIONS: The patient is currently admitted to the hospital and receiving intravenous antibiotics. The antibiotics were administered within an appropriate time frame prior to the initiation of the procedure. ANESTHESIA/SEDATION: Moderate (conscious) sedation was employed during this procedure. A total of Versed 1 mg and Fentanyl 25 mcg was administered intravenously. Moderate Sedation Time: 21 minutes. The patient's level of consciousness and vital signs were monitored continuously by radiology nursing throughout the procedure under my direct supervision. CONTRAST:  None COMPLICATIONS: None immediate. PROCEDURE: Informed written consent was obtained from the patient after a discussion of the risks, benefits and alternatives to treatment. The patient was placed supine on the CT gantry and a pre procedural CT was performed re-demonstrating the known abscess/fluid collection within the right retroperitoneum. The procedure was planned. A timeout was performed prior to the initiation of the procedure. The right lower quadrant was prepped and draped in the usual sterile fashion. The overlying soft tissues were anesthetized with 1% lidocaine with epinephrine. Appropriate trajectory was planned with the use of a 22 gauge  spinal needle. An 18 gauge trocar needle was advanced into the abscess/fluid collection and a short Amplatz super stiff wire was coiled within the collection. Appropriate positioning was confirmed with a limited CT scan. The tract was serially dilated allowing placement of a 14 French all-purpose drainage catheter. Appropriate positioning was confirmed with a limited postprocedural CT scan. Approximately 400 mL ml of purulent fluid was aspirated. The tube was connected to a bulb suction and sutured in place. A dressing was placed. The patient tolerated the procedure well without immediate post procedural complication. IMPRESSION: Successful CT guided placement of a 64 French all purpose drain catheter into the right retroperitoneum with aspiration of approximately 400 mL of purulent fluid. Samples were sent to the laboratory for culture as well as cytology given suspicion for underlying neoplastic etiology. Ruthann Cancer, MD [facility] Radiology Electronically Signed  By: Ruthann Cancer MD   On: 11/19/2020 08:01      Scheduled Meds: . sodium chloride   Intravenous Once  . Chlorhexidine Gluconate Cloth  6 each Topical Daily  . feeding supplement  237 mL Oral TID  . heparin injection (subcutaneous)  5,000 Units Subcutaneous Q8H  . influenza vac split quadrivalent PF  0.5 mL Intramuscular Tomorrow-1000  . insulin aspart  0-15 Units Subcutaneous Q4H  . lip balm  1 application Topical BID  . nicotine  21 mg Transdermal Daily  . polycarbophil  625 mg Oral BID  . sodium chloride flush  10-40 mL Intracatheter Q12H  . sodium chloride flush  5 mL Intracatheter Q8H   Continuous Infusions: . sodium chloride 10 mL/hr at 11/19/20 1347  . sodium chloride Stopped (11/20/20 1526)  . methocarbamol (ROBAXIN) IV    . piperacillin-tazobactam (ZOSYN)  IV 3.375 g (11/20/20 0634)  . TPN ADULT (ION) 70 mL/hr at 11/19/20 1742  . TPN ADULT (ION)       LOS: 5 days      Cordelia Poche, MD [facility] 11/20/2020, 3:28 PM  If 7PM-7AM, please contact night-coverage [email or web address]

## 2020-11-20 NOTE — Progress Notes (Signed)
PHARMACY - TOTAL PARENTERAL NUTRITION CONSULT NOTE   Indication: Perforated right colon into retroperitoneum  Patient Measurements: Height: 6' (182.9 cm) Weight: 69.4 kg (153 lb) IBW/kg (Calculated) : 77.6 TPN AdjBW (KG): 69.4 Body mass index is 20.75 kg/m. Usual Weight: 170 lbs  Assessment: Eric [name] is a 50 y.o. male no known medical history. Patient presented secondary to right groin pain and found to have a retroperitoneal abscess/bowel perforation/cecal mass concerning for neoplasm. Incidental finding of COVID-19 positive status. Surgery following, no surgical interventions at this time. Plans for short term management with drains and IV abx. Plan for surgical resection of R colon/retroperitoneal mass ideally in 6 weeks for more optimal surgical conditions.    Glucose / Insulin: No history of diabetes. No A1c available. CBGs 116 -160. Utilized 9 units / 24 hrs Electrolytes: Na 130 (osmolality 262, urine Na 148). K 4.6. Other electrolytes wnl.  Renal: Scr 0.62 - stable. BUN wnl.  LFTs / TGs: AST/ALT and Tbili wnl. TG 91 (2/4).  Prealbumin / albumin: Prealbumin 5.8. Albumin 1.7.  Intake / Output; MIVF: 1.8 ml/kg/hr. NS dec to [facility] on 2/8 ~1400. R JP drain 20 ml/24 hrs.  GI Imaging:  2/4 CT abd: long segment masslike thickening on cecum concerning for neoplasm, R retroperitoneal abscess 2/9 MRI abd: lesion in peripheral R liver - possible intrahepatic abscess vs. Metastatic disease. Dec fluid in R iliopsoas.  Surgeries / Procedures:  2/7: R retroperitoneal drain placed Central access: 11/18/20 TPN start date: 11/18/20  Nutritional Goals (per RD recommendation on 2/5): Kcal:  2200-2430; Protein:  110-125; Fluid:  >/= 2.2 L Goal TPN rate is 100 mL/hr (provides 120 g of protein and an average of 2328 kcals per day - with 21 g/L lipids on MWF)  Current Nutrition:  Clear liquid diet started 2/8 PM - pt reported consuming all liquids given and looking forward to advancing diet. Denies  abdominal pain.  Ensure TID - consumes 100% per pt (2 given on 2/8) TPN  Plan:  Increase TPN to goal rate of 100 mL/hr at 1800 - adjusted goal rate and components slightly to fit all components in TPN bag; this will provide 2112 kcal and 120g of protein Discussed with RD and patient meets criteria for moderate malnutrition and will hold lipids for 7-10 days  Electrolytes in TPN: Increase Na to 100 mEq/L, decrease K to 30 mEq/L, decrease Mg to 3 mEq/L. Continue phos 15 mmol/L (will increase w/ rate increase), Ca 5 mEq/L. Continue Cl:Ac 2:1.  Add standard MVI and trace elements to TPN Continue Moderate q4h SSI and adjust as needed - no changes for now, might need to adjust tomorrow Monitor TPN labs on Mon/Thurs Follow up toleration of advancement of diet and plans for weaning TPN - per Dr. Johney Maine might be able to wean TPN next week    Cristela Felt, PharmD Clinical Pharmacist  11/20/2020, 8:15 AM

## 2020-11-20 NOTE — Progress Notes (Addendum)
[facility] Progress Note     Subjective: CC-  Mild RLQ abdominal pain today. States that it continues to improve a little each day. He tolerated clear liquids yesterday without abdominal pain, n/v. Passing flatus, last BM 2-3 days ago. WBC down 13.7, afebrile. MRI of the liver is notable for 3 liver lesions on the right side, possible metastatic disease versus abscesses. We have asked IR to do biopsy vs aspiration which they plan to do later today.  Objective: Vital signs in last 24 hours: Temp:  [97.9 F (36.6 C)-98.6 F (37 C)] 97.9 F (36.6 C) (02/09 0437) Pulse Rate:  [86-96] 96 (02/09 0837) Resp:  [16-20] 16 (02/09 0837) BP: (115-124)/(72-80) 124/80 (02/09 0837) SpO2:  [94 %-97 %] 97 % (02/09 0837) Last BM Date: 11/17/20  Intake/Output from previous day: 02/08 0701 - 02/09 0700 In: 1132 [P.O.:1077; IV Piggyback:50] Out: 2995 [Urine:2975; Drains:20] Intake/Output this shift: Total I/O In: -  Out: 405 [Urine:400; Drains:5]  PE: Gen:  Alert, NAD, pleasant HEENT: EOM's intact, pupils equal and round Card:  RRR Pulm:  rate and effort normal Abd: Soft, ND, +BS, mild RLQ TTP without rebound or guarding Psych: A&Ox4  Skin: no rashes noted, warm and dry  Lab Results:  Recent Labs    11/19/20 0500 11/19/20 1312 11/20/20 0406  WBC 15.7*  --  13.7*  HGB 7.2* 7.0* 7.7*  HCT 23.1* 25.5* 26.0*  PLT 860*  --  855*   BMET Recent Labs    11/19/20 0500 11/19/20 1312 11/19/20 2110 11/20/20 0406  NA 128*   < > 129* 130*  K 4.0  --   --  4.6  CL 97*  --   --  100  CO2 22  --   --  20*  GLUCOSE 124*  --   --  116*  BUN 13  --   --  10  CREATININE 0.68  --   --  0.62  CALCIUM 7.7*  --   --  8.1*   < > = values in this interval not displayed.   PT/INR No results for input(s): LABPROT, INR in the last 72 hours. CMP     Component Value Date/Time   NA 130 (L) 11/20/2020 0406   K 4.6 11/20/2020 0406   CL 100 11/20/2020 0406   CO2 20 (L) 11/20/2020  0406   GLUCOSE 116 (H) 11/20/2020 0406   BUN 10 11/20/2020 0406   CREATININE 0.62 11/20/2020 0406   CALCIUM 8.1 (L) 11/20/2020 0406   PROT 6.0 (L) 11/20/2020 0406   ALBUMIN 1.7 (L) 11/20/2020 0406   AST 36 11/20/2020 0406   ALT 27 11/20/2020 0406   ALKPHOS 80 11/20/2020 0406   BILITOT 0.4 11/20/2020 0406   GFRNONAA >60 11/20/2020 0406   Lipase  No results found for: LIPASE     Studies/Results: MR PELVIS W WO CONTRAST  Result Date: 11/20/2020 CLINICAL DATA:  History of pelvic abscess. 3.5 cm liver lesion on CT. Scrotal pain. EXAM: MRI ABDOMEN AND PELVIS WITHOUT AND WITH CONTRAST TECHNIQUE: Multiplanar multisequence MR imaging of the abdomen and pelvis was performed both before and after the administration of intravenous contrast. CONTRAST:  44mL GADAVIST GADOBUTROL 1 MMOL/ML IV SOLN COMPARISON:  CT scan 11/15/2020. FINDINGS: COMBINED FINDINGS FOR BOTH MR ABDOMEN AND PELVIS Lower chest: Unremarkable. Hepatobiliary: As noted on recent CT scan, there is a 3.5 x 2.5 cm heterogeneous lesion in the inferior right liver peripherally. This shows irregular peripheral and heterogeneous central enhancement after  IV contrast administration. T2 imaging shows a surrounding halo of edema. No substantial restricted diffusion. A similar lesion is identified in the medial subcapsular right liver (segment VI) measuring 1.9 x 2.1 cm (see postcontrast image 48 of series 26). 1 additional irregular rim enhancing lesion is seen in the inferior left liver (segment IV B) visible on image 59 of series 26. 17 mm simple cyst identified lateral segment left liver. Several additional 3-4 mm hypoenhancing foci are seen scattered in the liver parenchyma, much too small to characterize but likely benign and may be tiny cysts or biliary hamartomas. There is no evidence for gallstones, gallbladder wall thickening, or pericholecystic fluid. Probable adenomyomatosis at the fundus. No intrahepatic or extrahepatic biliary dilation.  Pancreas: No focal mass lesion. No dilatation of the main duct. No intraparenchymal cyst. No peripancreatic edema. Spleen:  No splenomegaly. No focal mass lesion. Adrenals/Urinary Tract: No adrenal nodule or mass. Kidneys unremarkable. No hydroureter. Bladder unremarkable. Stomach/Bowel: Stomach is unremarkable. No gastric wall thickening. No evidence of outlet obstruction. Duodenum is normally positioned as is the ligament of Treitz. Mild diffuse distention of small bowel evident. Marked wall thickening again noted in the cecum. Colon is diffusely distended and stool-filled as before. Vascular/Lymphatic: No abdominal aortic aneurysm There is no gastrohepatic or hepatoduodenal ligament lymphadenopathy. No retroperitoneal or mesenteric lymphadenopathy. No pelvic sidewall lymphadenopathy. Reproductive: The prostate gland and seminal vesicles are unremarkable. Although not a dedicated scrotal exam, there is no evidence for testicular mass lesion or hydrocele. Other:  No substantial intraperitoneal free fluid. Musculoskeletal: Right iliopsoas abscess again noted with percutaneous drainage catheter now in place with distal loop formed in the psoas component of the process. Interval decrease in size of the rim enhancing fluid collection seen extending down anterior to the right hip as before. No suspicious focal marrow abnormality. IMPRESSION: 1. 3.5 x 2.5 cm heterogeneous enhancing lesion in the inferior peripheral right liver with 2 additional smaller lesions having similar imaging features. Given the large right retroperitoneal abscess, intrahepatic abscesses would be a distinct consideration. Metastatic disease also a consideration. Tissue sampling likely warranted. 2. Interval decrease in size of the rim enhancing right iliopsoas fluid collection status post percutaneous drain placement. 3. Marked wall thickening again noted in the cecum with diffuse distention of small bowel loops. These findings are better  characterized on the recent CT scan. Electronically Signed   By: Misty Stanley M.D.   On: 11/20/2020 05:53   MR ABDOMEN W WO CONTRAST  Result Date: 11/20/2020 CLINICAL DATA:  History of pelvic abscess. 3.5 cm liver lesion on CT. Scrotal pain. EXAM: MRI ABDOMEN AND PELVIS WITHOUT AND WITH CONTRAST TECHNIQUE: Multiplanar multisequence MR imaging of the abdomen and pelvis was performed both before and after the administration of intravenous contrast. CONTRAST:  37mL GADAVIST GADOBUTROL 1 MMOL/ML IV SOLN COMPARISON:  CT scan 11/15/2020. FINDINGS: COMBINED FINDINGS FOR BOTH MR ABDOMEN AND PELVIS Lower chest: Unremarkable. Hepatobiliary: As noted on recent CT scan, there is a 3.5 x 2.5 cm heterogeneous lesion in the inferior right liver peripherally. This shows irregular peripheral and heterogeneous central enhancement after IV contrast administration. T2 imaging shows a surrounding halo of edema. No substantial restricted diffusion. A similar lesion is identified in the medial subcapsular right liver (segment VI) measuring 1.9 x 2.1 cm (see postcontrast image 48 of series 26). 1 additional irregular rim enhancing lesion is seen in the inferior left liver (segment IV B) visible on image 59 of series 26. 17 mm simple cyst identified lateral segment  left liver. Several additional 3-4 mm hypoenhancing foci are seen scattered in the liver parenchyma, much too small to characterize but likely benign and may be tiny cysts or biliary hamartomas. There is no evidence for gallstones, gallbladder wall thickening, or pericholecystic fluid. Probable adenomyomatosis at the fundus. No intrahepatic or extrahepatic biliary dilation. Pancreas: No focal mass lesion. No dilatation of the main duct. No intraparenchymal cyst. No peripancreatic edema. Spleen:  No splenomegaly. No focal mass lesion. Adrenals/Urinary Tract: No adrenal nodule or mass. Kidneys unremarkable. No hydroureter. Bladder unremarkable. Stomach/Bowel: Stomach is  unremarkable. No gastric wall thickening. No evidence of outlet obstruction. Duodenum is normally positioned as is the ligament of Treitz. Mild diffuse distention of small bowel evident. Marked wall thickening again noted in the cecum. Colon is diffusely distended and stool-filled as before. Vascular/Lymphatic: No abdominal aortic aneurysm There is no gastrohepatic or hepatoduodenal ligament lymphadenopathy. No retroperitoneal or mesenteric lymphadenopathy. No pelvic sidewall lymphadenopathy. Reproductive: The prostate gland and seminal vesicles are unremarkable. Although not a dedicated scrotal exam, there is no evidence for testicular mass lesion or hydrocele. Other:  No substantial intraperitoneal free fluid. Musculoskeletal: Right iliopsoas abscess again noted with percutaneous drainage catheter now in place with distal loop formed in the psoas component of the process. Interval decrease in size of the rim enhancing fluid collection seen extending down anterior to the right hip as before. No suspicious focal marrow abnormality. IMPRESSION: 1. 3.5 x 2.5 cm heterogeneous enhancing lesion in the inferior peripheral right liver with 2 additional smaller lesions having similar imaging features. Given the large right retroperitoneal abscess, intrahepatic abscesses would be a distinct consideration. Metastatic disease also a consideration. Tissue sampling likely warranted. 2. Interval decrease in size of the rim enhancing right iliopsoas fluid collection status post percutaneous drain placement. 3. Marked wall thickening again noted in the cecum with diffuse distention of small bowel loops. These findings are better characterized on the recent CT scan. Electronically Signed   By: Misty Stanley M.D.   On: 11/20/2020 05:53   CT IMAGE GUIDED DRAINAGE BY PERCUTANEOUS CATHETER  Result Date: 11/19/2020 INDICATION: 50 year old male with history of cecal perforation with fluid collection in the right retroperitoneum.  Suspected underlying neoplastic etiology. EXAM: CT IMAGE GUIDED DRAINAGE BY PERCUTANEOUS CATHETER COMPARISON:  CT abdomen pelvis from 11/15/2020 MEDICATIONS: The patient is currently admitted to the hospital and receiving intravenous antibiotics. The antibiotics were administered within an appropriate time frame prior to the initiation of the procedure. ANESTHESIA/SEDATION: Moderate (conscious) sedation was employed during this procedure. A total of Versed 1 mg and Fentanyl 25 mcg was administered intravenously. Moderate Sedation Time: 21 minutes. The patient's level of consciousness and vital signs were monitored continuously by radiology nursing throughout the procedure under my direct supervision. CONTRAST:  None COMPLICATIONS: None immediate. PROCEDURE: Informed written consent was obtained from the patient after a discussion of the risks, benefits and alternatives to treatment. The patient was placed supine on the CT gantry and a pre procedural CT was performed re-demonstrating the known abscess/fluid collection within the right retroperitoneum. The procedure was planned. A timeout was performed prior to the initiation of the procedure. The right lower quadrant was prepped and draped in the usual sterile fashion. The overlying soft tissues were anesthetized with 1% lidocaine with epinephrine. Appropriate trajectory was planned with the use of a 22 gauge spinal needle. An 18 gauge trocar needle was advanced into the abscess/fluid collection and a short Amplatz super stiff wire was coiled within the collection. Appropriate positioning was  confirmed with a limited CT scan. The tract was serially dilated allowing placement of a 14 French all-purpose drainage catheter. Appropriate positioning was confirmed with a limited postprocedural CT scan. Approximately 400 mL ml of purulent fluid was aspirated. The tube was connected to a bulb suction and sutured in place. A dressing was placed. The patient tolerated the  procedure well without immediate post procedural complication. IMPRESSION: Successful CT guided placement of a 14 French all purpose drain catheter into the right retroperitoneum with aspiration of approximately 400 mL of purulent fluid. Samples were sent to the laboratory for culture as well as cytology given suspicion for underlying neoplastic etiology. Ruthann Cancer, MD [facility] Electronically Signed   By: Ruthann Cancer MD   On: 11/19/2020 08:01   [place] EKG SITE RITE  Result Date: 11/18/2020 If Site Rite image not attached, placement could not be confirmed due to current cardiac rhythm.   Anti-infectives: Anti-infectives (From admission, onward)   Start     Dose/Rate Route Frequency Ordered Stop   11/16/20 1000  remdesivir 100 mg in sodium chloride 0.9 % 100 mL IVPB       "Followed by" Linked Group Details   100 mg 200 mL/hr over 30 Minutes Intravenous Daily 11/15/20 2135 11/17/20 1002   11/15/20 2230  piperacillin-tazobactam (ZOSYN) IVPB 3.375 g        3.375 g 12.5 mL/hr over 240 Minutes Intravenous Every 8 hours 11/15/20 2144     11/15/20 2134  remdesivir 200 mg in sodium chloride 0.9% 250 mL IVPB       "Followed by" Linked Group Details   200 mg 580 mL/hr over 30 Minutes Intravenous Once 11/15/20 2135 11/16/20 0029   11/15/20 1700  piperacillin-tazobactam (ZOSYN) IVPB 3.375 g        3.375 g 100 mL/hr over 30 Minutes Intravenous  Once 11/15/20 1657 11/15/20 1826       Assessment/Plan IDA Hyponatremia Tobacco abuse COVID-19 Severe malnutrition - prealbumin 5.8 (2/8), continue TPN  Colon mass with perforation and abscess - concerning for cancer - at some point he is going to require surgical resection, but ideally this can be delayed; if he does not improve he would need surgery this admission which would likely include colon resection and ileostomy Retroperitoneal abscess - s/p IR drain placement 2/7 - cx  Enterobacter aerogenes - continue drain and IV zosyn Liver lesions - x3 noted on MRI, unsure if abscess vs metastatic disease. IR to do aspiration vs biopsy today. If metastatic disease will need oncology consult  ID - zosyn 2/4>> FEN - TPN, NPO for procedure VTE - sq heparin Foley - none  Plan: Ok to advance to soft diet after IR procedure today. If he tolerates this we can likely start weaning TPN tomorrow.    LOS: 5 days    [facility] 11/20/2020, 10:50 AM Please see Amion for pager number during day hours 7:00am-4:30pm

## 2020-11-20 NOTE — TOC Initial Note (Signed)
Transition of Care [facility]) - Initial/Assessment Note    Patient Details  Name: Eric [name] MRN: [id number] Date of Birth: 27-Jun-1971  Transition of [facility]) CM/SW Contact:    Carles Collet, RN Phone Number: 11/20/2020, 11:54 AM  Clinical Narrative:         Damaris Schooner w patient over the phone. He states that he lives at home with a roommate who is able and willing to help him after DC. He states that prior to admission he was fully independent, and works as a Physiological scientist. He confirms that he does not have health insurance, email sent to financial counselors at [employer] to assist w Medicaid application. Requested CMA to schedule at [facility], as patient states he has not been to a PCP since 1990's.  TOC will continue to follow for DC needs, including HH, DME, and medication assistance.             Expected Discharge Plan: [place] Barriers to Discharge: Continued Medical Work up   Patient Goals and CMS Choice Patient states their goals for this hospitalization and ongoing recovery are:: to go home      Expected Discharge Plan and Services Expected Discharge Plan: [place]   Discharge Planning Services: [facility]                                          Prior Living Arrangements/Services   Lives with:: Roommate                   Activities of Daily Living Home Assistive Devices/Equipment: Cane (specify quad or straight) (d/t RLQ abd and R groin pain) ADL Screening (condition at time of admission) Patient's cognitive ability adequate to safely complete daily activities?: Yes Is the patient deaf or have difficulty hearing?: No Does the patient have difficulty seeing, even when wearing glasses/contacts?: No Does the patient have difficulty concentrating, remembering, or making decisions?: No Patient able to express need for assistance with ADLs?: Yes Does the patient have difficulty dressing or  bathing?: No Independently performs ADLs?: Yes (appropriate for developmental age) Does the patient have difficulty walking or climbing stairs?: No Weakness of Legs: None Weakness of Arms/Hands: None  Permission Sought/Granted                  Emotional Assessment              Admission diagnosis:  Retroperitoneal abscess ([place]) [K68.19] Thrombocytosis [D75.839] Bandemia [D72.825] Right lower quadrant abdominal mass [R19.03] Perforation of cecum [K35.32] Right lower quadrant pain [R10.31] Colonic mass [K63.89] Symptomatic anemia [D64.9] Anemia, unspecified type [D64.9] COVID-19 [U07.1] Patient Active Problem List   Diagnosis Date Noted  . Protein-calorie malnutrition, severe ([place]) 11/18/2020  . Liver mass, right lobe 11/18/2020  . Right groin pain 11/18/2020  . Ambulates with cane 11/18/2020  . Tobacco abuse 11/18/2020  . Abdominal pain, RLQ (right lower quadrant) 11/18/2020  . Fatigue 11/18/2020  . Symptomatic anemia 11/18/2020  . Colonic mass 11/15/2020  . Iron deficiency anemia due to chronic blood loss 11/15/2020  . Sepsis ([place]) 11/15/2020  . COVID-19 virus infection 11/15/2020  . Thrombocytosis 11/15/2020  . Retroperitoneal abscess ([place]) 11/15/2020  . Hyponatremia 11/15/2020   PCP:  [facility] [place]:   [place], [place]. [address] [place] [address] Phone: [phone] Fax: [phone]     Social Determinants of Health (SDOH) Interventions    Readmission Risk Interventions No flowsheet data found.

## 2020-11-20 NOTE — Procedures (Signed)
Interventional Radiology Procedure Note  Procedure: Liver lesion biopsy  Indication: Liver lesion  Findings: Please refer to procedural dictation for full description.  Complications: None  EBL: < 10 mL  Miachel Roux, MD [phone]

## 2020-11-20 NOTE — Progress Notes (Signed)
Referring Physician(s): Dr. Johney Maine  Supervising Physician: Mir, Sharen Heck  Patient Status:  [facility] - In-pt  Chief Complaint: Liver lesions Retroperitoneal abscess  Subjective: Patient resting comfortably in bed.  Reports drain placement significantly reduced his pain.  He underwent MR Abdomen Pelvis yesterday which showed: 1. 3.5 x 2.5 cm heterogeneous enhancing lesion in the inferior peripheral right liver with 2 additional smaller lesions having similar imaging features. Given the large right retroperitoneal abscess, intrahepatic abscesses would be a distinct consideration. Metastatic disease also a consideration. Tissue sampling likely warranted. 2. Interval decrease in size of the rim enhancing right iliopsoas fluid collection status post percutaneous drain placement. 3. Marked wall thickening again noted in the cecum with diffuse distention of small bowel loops. These findings are better characterized on the recent CT scan.  IR now consulted for liver lesion biopsy at the request of Dr. Johney Maine.  Case reviewed by Dr. Dwaine Gale who approves patient for procedure today.   Patient has discussed recent imaging with medical team.  He is agreeable to proceed with biopsy.   Allergies: Patient has no known allergies.  Medications: Prior to Admission medications   Medication Sig Start Date End Date Taking? Authorizing Provider  acetaminophen (TYLENOL) 500 MG tablet Take 500 mg by mouth every 6 (six) hours as needed.   Yes [provider]  Calcium Carbonate Antacid (ALKA-SELTZER ANTACID PO) Take 1 tablet by mouth daily as needed (For pain).   Yes [provider]  famotidine (PEPCID) 20 MG tablet Take 20 mg by mouth 2 (two) times daily.   Yes [provider]     Vital Signs: BP 124/80 (BP Location: Left Arm)   Pulse 96   Temp 97.9 F (36.6 C) (Oral)   Resp 16   Ht 6' (1.829 m)   Wt 153 lb (69.4 kg)   SpO2 97%   BMI 20.75 kg/m   Physical Exam  NAD,  alert, resting comfortably on room air Abdomen: soft, non-tender.  R RP drain in place.  Insertion site intact.  Thick bloody, dark output from drain.  Flushes and aspirates easily.   Imaging: MR PELVIS W WO CONTRAST  Result Date: 11/20/2020 CLINICAL DATA:  History of pelvic abscess. 3.5 cm liver lesion on CT. Scrotal pain. EXAM: MRI ABDOMEN AND PELVIS WITHOUT AND WITH CONTRAST TECHNIQUE: Multiplanar multisequence MR imaging of the abdomen and pelvis was performed both before and after the administration of intravenous contrast. CONTRAST:  42mL GADAVIST GADOBUTROL 1 MMOL/ML IV SOLN COMPARISON:  CT scan 11/15/2020. FINDINGS: COMBINED FINDINGS FOR BOTH MR ABDOMEN AND PELVIS Lower chest: Unremarkable. Hepatobiliary: As noted on recent CT scan, there is a 3.5 x 2.5 cm heterogeneous lesion in the inferior right liver peripherally. This shows irregular peripheral and heterogeneous central enhancement after IV contrast administration. T2 imaging shows a surrounding halo of edema. No substantial restricted diffusion. A similar lesion is identified in the medial subcapsular right liver (segment VI) measuring 1.9 x 2.1 cm (see postcontrast image 48 of series 26). 1 additional irregular rim enhancing lesion is seen in the inferior left liver (segment IV B) visible on image 59 of series 26. 17 mm simple cyst identified lateral segment left liver. Several additional 3-4 mm hypoenhancing foci are seen scattered in the liver parenchyma, much too small to characterize but likely benign and may be tiny cysts or biliary hamartomas. There is no evidence for gallstones, gallbladder wall thickening, or pericholecystic fluid. Probable adenomyomatosis at the fundus. No intrahepatic or extrahepatic biliary dilation.  Pancreas: No focal mass lesion. No dilatation of the main duct. No intraparenchymal cyst. No peripancreatic edema. Spleen:  No splenomegaly. No focal mass lesion. Adrenals/Urinary Tract: No adrenal nodule or mass. Kidneys  unremarkable. No hydroureter. Bladder unremarkable. Stomach/Bowel: Stomach is unremarkable. No gastric wall thickening. No evidence of outlet obstruction. Duodenum is normally positioned as is the ligament of Treitz. Mild diffuse distention of small bowel evident. Marked wall thickening again noted in the cecum. Colon is diffusely distended and stool-filled as before. Vascular/Lymphatic: No abdominal aortic aneurysm There is no gastrohepatic or hepatoduodenal ligament lymphadenopathy. No retroperitoneal or mesenteric lymphadenopathy. No pelvic sidewall lymphadenopathy. Reproductive: The prostate gland and seminal vesicles are unremarkable. Although not a dedicated scrotal exam, there is no evidence for testicular mass lesion or hydrocele. Other:  No substantial intraperitoneal free fluid. Musculoskeletal: Right iliopsoas abscess again noted with percutaneous drainage catheter now in place with distal loop formed in the psoas component of the process. Interval decrease in size of the rim enhancing fluid collection seen extending down anterior to the right hip as before. No suspicious focal marrow abnormality. IMPRESSION: 1. 3.5 x 2.5 cm heterogeneous enhancing lesion in the inferior peripheral right liver with 2 additional smaller lesions having similar imaging features. Given the large right retroperitoneal abscess, intrahepatic abscesses would be a distinct consideration. Metastatic disease also a consideration. Tissue sampling likely warranted. 2. Interval decrease in size of the rim enhancing right iliopsoas fluid collection status post percutaneous drain placement. 3. Marked wall thickening again noted in the cecum with diffuse distention of small bowel loops. These findings are better characterized on the recent CT scan. Electronically Signed   By: Misty Stanley M.D.   On: 11/20/2020 05:53   MR ABDOMEN W WO CONTRAST  Result Date: 11/20/2020 CLINICAL DATA:  History of pelvic abscess. 3.5 cm liver lesion on  CT. Scrotal pain. EXAM: MRI ABDOMEN AND PELVIS WITHOUT AND WITH CONTRAST TECHNIQUE: Multiplanar multisequence MR imaging of the abdomen and pelvis was performed both before and after the administration of intravenous contrast. CONTRAST:  70mL GADAVIST GADOBUTROL 1 MMOL/ML IV SOLN COMPARISON:  CT scan 11/15/2020. FINDINGS: COMBINED FINDINGS FOR BOTH MR ABDOMEN AND PELVIS Lower chest: Unremarkable. Hepatobiliary: As noted on recent CT scan, there is a 3.5 x 2.5 cm heterogeneous lesion in the inferior right liver peripherally. This shows irregular peripheral and heterogeneous central enhancement after IV contrast administration. T2 imaging shows a surrounding halo of edema. No substantial restricted diffusion. A similar lesion is identified in the medial subcapsular right liver (segment VI) measuring 1.9 x 2.1 cm (see postcontrast image 48 of series 26). 1 additional irregular rim enhancing lesion is seen in the inferior left liver (segment IV B) visible on image 59 of series 26. 17 mm simple cyst identified lateral segment left liver. Several additional 3-4 mm hypoenhancing foci are seen scattered in the liver parenchyma, much too small to characterize but likely benign and may be tiny cysts or biliary hamartomas. There is no evidence for gallstones, gallbladder wall thickening, or pericholecystic fluid. Probable adenomyomatosis at the fundus. No intrahepatic or extrahepatic biliary dilation. Pancreas: No focal mass lesion. No dilatation of the main duct. No intraparenchymal cyst. No peripancreatic edema. Spleen:  No splenomegaly. No focal mass lesion. Adrenals/Urinary Tract: No adrenal nodule or mass. Kidneys unremarkable. No hydroureter. Bladder unremarkable. Stomach/Bowel: Stomach is unremarkable. No gastric wall thickening. No evidence of outlet obstruction. Duodenum is normally positioned as is the ligament of Treitz. Mild diffuse distention of small bowel evident. Marked wall  thickening again noted in the cecum.  Colon is diffusely distended and stool-filled as before. Vascular/Lymphatic: No abdominal aortic aneurysm There is no gastrohepatic or hepatoduodenal ligament lymphadenopathy. No retroperitoneal or mesenteric lymphadenopathy. No pelvic sidewall lymphadenopathy. Reproductive: The prostate gland and seminal vesicles are unremarkable. Although not a dedicated scrotal exam, there is no evidence for testicular mass lesion or hydrocele. Other:  No substantial intraperitoneal free fluid. Musculoskeletal: Right iliopsoas abscess again noted with percutaneous drainage catheter now in place with distal loop formed in the psoas component of the process. Interval decrease in size of the rim enhancing fluid collection seen extending down anterior to the right hip as before. No suspicious focal marrow abnormality. IMPRESSION: 1. 3.5 x 2.5 cm heterogeneous enhancing lesion in the inferior peripheral right liver with 2 additional smaller lesions having similar imaging features. Given the large right retroperitoneal abscess, intrahepatic abscesses would be a distinct consideration. Metastatic disease also a consideration. Tissue sampling likely warranted. 2. Interval decrease in size of the rim enhancing right iliopsoas fluid collection status post percutaneous drain placement. 3. Marked wall thickening again noted in the cecum with diffuse distention of small bowel loops. These findings are better characterized on the recent CT scan. Electronically Signed   By: Misty Stanley M.D.   On: 11/20/2020 05:53   CT IMAGE GUIDED DRAINAGE BY PERCUTANEOUS CATHETER  Result Date: 11/19/2020 INDICATION: 50 year old male with history of cecal perforation with fluid collection in the right retroperitoneum. Suspected underlying neoplastic etiology. EXAM: CT IMAGE GUIDED DRAINAGE BY PERCUTANEOUS CATHETER COMPARISON:  CT abdomen pelvis from 11/15/2020 MEDICATIONS: The patient is currently admitted to the hospital and receiving intravenous  antibiotics. The antibiotics were administered within an appropriate time frame prior to the initiation of the procedure. ANESTHESIA/SEDATION: Moderate (conscious) sedation was employed during this procedure. A total of Versed 1 mg and Fentanyl 25 mcg was administered intravenously. Moderate Sedation Time: 21 minutes. The patient's level of consciousness and vital signs were monitored continuously by radiology nursing throughout the procedure under my direct supervision. CONTRAST:  None COMPLICATIONS: None immediate. PROCEDURE: Informed written consent was obtained from the patient after a discussion of the risks, benefits and alternatives to treatment. The patient was placed supine on the CT gantry and a pre procedural CT was performed re-demonstrating the known abscess/fluid collection within the right retroperitoneum. The procedure was planned. A timeout was performed prior to the initiation of the procedure. The right lower quadrant was prepped and draped in the usual sterile fashion. The overlying soft tissues were anesthetized with 1% lidocaine with epinephrine. Appropriate trajectory was planned with the use of a 22 gauge spinal needle. An 18 gauge trocar needle was advanced into the abscess/fluid collection and a short Amplatz super stiff wire was coiled within the collection. Appropriate positioning was confirmed with a limited CT scan. The tract was serially dilated allowing placement of a 14 French all-purpose drainage catheter. Appropriate positioning was confirmed with a limited postprocedural CT scan. Approximately 400 mL ml of purulent fluid was aspirated. The tube was connected to a bulb suction and sutured in place. A dressing was placed. The patient tolerated the procedure well without immediate post procedural complication. IMPRESSION: Successful CT guided placement of a 29 French all purpose drain catheter into the right retroperitoneum with aspiration of approximately 400 mL of purulent fluid.  Samples were sent to the laboratory for culture as well as cytology given suspicion for underlying neoplastic etiology. Ruthann Cancer, MD [facility]iology Electronically Signed  By: Ruthann Cancer MD   On: 11/19/2020 08:01   [place] EKG SITE RITE  Result Date: 11/18/2020 If Site Rite image not attached, placement could not be confirmed due to current cardiac rhythm.   Labs:  CBC: Recent Labs    11/17/20 0402 11/18/20 0408 11/19/20 0500 11/19/20 1312 11/20/20 0406  WBC 21.3* 20.8* 15.7*  --  13.7*  HGB 7.7* 7.6* 7.2* 7.0* 7.7*  HCT 23.7* 25.0* 23.1* 25.5* 26.0*  PLT 895* 818* 860*  --  855*    COAGS: Recent Labs    11/15/20 1520  INR 1.2    BMP: Recent Labs    11/17/20 0402 11/18/20 0408 11/18/20 2142 11/19/20 0500 11/19/20 1312 11/19/20 2110 11/20/20 0406  NA 128* 124*   < > 128* 126* 129* 130*  K 4.0 3.8  --  4.0  --   --  4.6  CL 97* 94*  --  97*  --   --  100  CO2 22 22  --  22  --   --  20*  GLUCOSE 106* 123*  --  124*  --   --  116*  BUN 13 11  --  13  --   --  10  CALCIUM 8.0* 7.8*  --  7.7*  --   --  8.1*  CREATININE 0.75 0.77  --  0.68  --   --  0.62  GFRNONAA >60 >60  --  >60  --   --  >60   < > = values in this interval not displayed.    LIVER FUNCTION TESTS: Recent Labs    11/17/20 0402 11/18/20 0408 11/19/20 0500 11/20/20 0406  BILITOT 0.6 0.4 0.5 0.4  AST 12* 14* 24 36  ALT 14 14 17 27   ALKPHOS 81 71 76 80  PROT 6.1* 5.8* 5.5* 6.0*  ALBUMIN 1.6* 1.5* 1.5* 1.7*    Assessment and Plan: Retroperitoneal abscess s/p percutaneous drain placement by Dr. Serafina Royals 2/8 Drain remains in place.  No issues or complaint.   Output ongoing.  Dark, bloody.  20 mL documented output yesterday.  Flushes easily.  Continue with current management.   Newly identified liver lesions on MR  Patient has discussed his imaging findings with medical team.  Per Dr. Johney Maine documentation, biopsy indicated to determine  abscess vs. Metastatic disease.  Patient is agreeable.  He did get SQ heparin this AM at 830 Has been NPO since 830.  INR 1.2 2/4 Thrombocytosis- platelets 895.  Discussed with Dr. Dwaine Gale who approves patient for biopsy as schedule allows.   Risks and benefits of biopsy was discussed with the patient and/or patient's family including, but not limited to bleeding, infection, damage to adjacent structures or low yield requiring additional tests.  All of the questions were answered and there is agreement to proceed.  Consent signed and in chart.  Electronically Signed: Docia Barrier, PA 11/20/2020, 12:13 PM   I spent a total of 25 Minutes at the the patient's bedside AND on the patient's hospital floor or unit, greater than 50% of which was counseling/coordinating care for liver lesion.

## 2020-11-21 DIAGNOSIS — E440 Moderate protein-calorie malnutrition: Secondary | ICD-10-CM | POA: Insufficient documentation

## 2020-11-21 DIAGNOSIS — C787 Secondary malignant neoplasm of liver and intrahepatic bile duct: Secondary | ICD-10-CM

## 2020-11-21 DIAGNOSIS — C189 Malignant neoplasm of colon, unspecified: Secondary | ICD-10-CM

## 2020-11-21 LAB — TYPE AND SCREEN
ABO/RH(D): A POS
Antibody Screen: NEGATIVE
Unit division: 0
Unit division: 0

## 2020-11-21 LAB — GLUCOSE, CAPILLARY
Glucose-Capillary: 130 mg/dL — ABNORMAL HIGH (ref 70–99)
Glucose-Capillary: 139 mg/dL — ABNORMAL HIGH (ref 70–99)
Glucose-Capillary: 141 mg/dL — ABNORMAL HIGH (ref 70–99)
Glucose-Capillary: 145 mg/dL — ABNORMAL HIGH (ref 70–99)
Glucose-Capillary: 155 mg/dL — ABNORMAL HIGH (ref 70–99)
Glucose-Capillary: 161 mg/dL — ABNORMAL HIGH (ref 70–99)

## 2020-11-21 LAB — BPAM RBC
Blood Product Expiration Date: 202202122359
Blood Product Expiration Date: 202202162359
ISSUE DATE / TIME: 202202091600
ISSUE DATE / TIME: 202202091744
Unit Type and Rh: 6200
Unit Type and Rh: 6200

## 2020-11-21 LAB — COMPREHENSIVE METABOLIC PANEL
ALT: 36 U/L (ref 0–44)
AST: 35 U/L (ref 15–41)
Albumin: 1.7 g/dL — ABNORMAL LOW (ref 3.5–5.0)
Alkaline Phosphatase: 74 U/L (ref 38–126)
Anion gap: 8 (ref 5–15)
BUN: 14 mg/dL (ref 6–20)
CO2: 23 mmol/L (ref 22–32)
Calcium: 8.3 mg/dL — ABNORMAL LOW (ref 8.9–10.3)
Chloride: 102 mmol/L (ref 98–111)
Creatinine, Ser: 0.57 mg/dL — ABNORMAL LOW (ref 0.61–1.24)
GFR, Estimated: >60 mL/min (ref >60)
Glucose, Bld: 142 mg/dL — ABNORMAL HIGH (ref 70–99)
Potassium: 4.6 mmol/L (ref 3.5–5.1)
Sodium: 133 mmol/L — ABNORMAL LOW (ref 135–145)
Total Bilirubin: 0.5 mg/dL (ref 0.3–1.2)
Total Protein: 6 g/dL — ABNORMAL LOW (ref 6.5–8.1)

## 2020-11-21 LAB — CBC
HCT: 29 % — ABNORMAL LOW (ref 39.0–52.0)
Hemoglobin: 9 g/dL — ABNORMAL LOW (ref 13.0–17.0)
MCH: 22.4 pg — ABNORMAL LOW (ref 26.0–34.0)
MCHC: 31 g/dL (ref 30.0–36.0)
MCV: 72.1 fL — ABNORMAL LOW (ref 80.0–100.0)
Platelets: 869 10*3/uL — ABNORMAL HIGH (ref 150–400)
RBC: 4.02 MIL/uL — ABNORMAL LOW (ref 4.22–5.81)
RDW: 28.1 % — ABNORMAL HIGH (ref 11.5–15.5)
WBC: 13.1 10*3/uL — ABNORMAL HIGH (ref 4.0–10.5)
nRBC: 0 % (ref 0.0–0.2)

## 2020-11-21 LAB — AEROBIC/ANAEROBIC CULTURE W GRAM STAIN (SURGICAL/DEEP WOUND)

## 2020-11-21 LAB — MAGNESIUM: Magnesium: 2.3 mg/dL (ref 1.7–2.4)

## 2020-11-21 LAB — PHOSPHORUS: Phosphorus: 3.9 mg/dL (ref 2.5–4.6)

## 2020-11-21 MED ORDER — TRAVASOL 10 % IV SOLN
INTRAVENOUS | Status: AC
  Filled 2020-11-21: qty 1200

## 2020-11-21 MED ORDER — HYDROMORPHONE HCL 1 MG/ML IJ SOLN
0.5000 mg | INTRAMUSCULAR | Status: DC | PRN
  Administered 2020-11-26 – 2020-11-27 (×2): 2 mg via INTRAVENOUS
  Administered 2020-11-27: 1 mg via INTRAVENOUS
  Filled 2020-11-21 (×2): qty 2
  Filled 2020-11-21: qty 1

## 2020-11-21 NOTE — Progress Notes (Signed)
CC: Right lower quadrant/hip pain  Subjective: He denies any pain in his right side or his right thigh. Tolerating clears.  Drain is still dark purulent fluid, and he has some clotted blood in the bulb.    Objective: Vital signs in last 24 hours: Temp:  [97.7 F (36.5 C)-98.5 F (36.9 C)] 98.5 F (36.9 C) (02/10 0400) Pulse Rate:  [81-95] 83 (02/10 0400) Resp:  [16-23] 20 (02/10 0400) BP: (110-142)/(77-96) 119/81 (02/10 0400) SpO2:  [95 %-99 %] 96 % (02/10 0400) Last BM Date: 11/17/20 487 p.o. 1900 IV 3625 urine Drain 100 Afebrile, vital signs are stable CMP stable WBC 13.1 H/H 9/29 Platelets 869,000  Intake/Output from previous day: 02/09 0701 - 02/10 0700 In: 2484.4 [P.O.:487; I.V.:1218.1; Blood:679.3; IV Piggyback:100] Out: 1027 [Urine:3625; Drains:100] Intake/Output this shift: Total I/O In: 250 [P.O.:250] Out: 450 [Urine:450]  General appearance: alert, cooperative and no distress Resp: dry non productive cough Cardio: reg rate and rhythm GI: soft nontender + BS, No BM recorded, tolerating clears, no pain in abdomen or thigh currently  Lab Results:  Recent Labs    11/20/20 0406 11/21/20 0345  WBC 13.7* 13.1*  HGB 7.7* 9.0*  HCT 26.0* 29.0*  PLT 855* 869*    BMET Recent Labs    11/20/20 0406 11/20/20 1132 11/21/20 0345  NA 130* 129* 133*  K 4.6  --  4.6  CL 100  --  102  CO2 20*  --  23  GLUCOSE 116*  --  142*  BUN 10  --  14  CREATININE 0.62  --  0.57*  CALCIUM 8.1*  --  8.3*   PT/INR No results for input(s): LABPROT, INR in the last 72 hours.  Recent Labs  Lab 11/17/20 0402 11/18/20 0408 11/19/20 0500 11/20/20 0406 11/21/20 0345  AST 12* 14* 24 36 35  ALT 14 14 17 27  36  ALKPHOS 81 71 76 80 74  BILITOT 0.6 0.4 0.5 0.4 0.5  PROT 6.1* 5.8* 5.5* 6.0* 6.0*  ALBUMIN 1.6* 1.5* 1.5* 1.7* 1.7*     Lipase  No results found for: LIPASE   Medications: . Chlorhexidine Gluconate Cloth  6 each Topical Daily  . feeding supplement   237 mL Oral TID  . heparin injection (subcutaneous)  5,000 Units Subcutaneous Q8H  . influenza vac split quadrivalent PF  0.5 mL Intramuscular Tomorrow-1000  . insulin aspart  0-15 Units Subcutaneous Q4H  . lip balm  1 application Topical BID  . nicotine  21 mg Transdermal Daily  . polycarbophil  625 mg Oral BID  . sodium chloride flush  10-40 mL Intracatheter Q12H  . sodium chloride flush  5 mL Intracatheter Q8H   . sodium chloride 10 mL/hr at 11/21/20 0642  . methocarbamol (ROBAXIN) IV    . piperacillin-tazobactam (ZOSYN)  IV 3.375 g (11/21/20 0634)  . TPN ADULT (ION) 100 mL/hr at 11/20/20 1820    Assessment/Plan IDA Hyponatremia Tobacco abuse COVID-19 positive   - CRP 21.4>>18.9>>20.2>>19.9>>12.9  -Platelets 1165K>>954K>>895K>>818K>>860K>>869 .Severe malnutrition - prealbumin 5.8 (2/8), continue TPN  Colon mass with perforation and abscess - concerning for cancer - at some point he is going to require surgical resection, but ideally this can be delayed; if he does not improve he would need surgery this admission which would likely include colon resection and ileostomy  - WBC 18.6>>21.3>>20.8>>15.7>>13.7>>13.1 Retroperitoneal abscess - s/p IR drain placement 2/7 - cx Enterobacter aerogenes - continue drain and IV zosyn Liver lesions - x3 noted on MRI,  unsure if abscess vs metastatic disease.  IR liver biopsy 11/20/2020; path pending  ID - zosyn 2/4>> day 7 FEN - TPN@ 100 ml/hr/clear liquids VTE - sq heparin Foley - none  Plan:  Will let him go to full liquids and continue current plans.  Consider adding probiotic, I expect he will be on antibiotics for some time.        LOS: 6 days    Brace Welte 11/21/2020 Please see Amion

## 2020-11-21 NOTE — Plan of Care (Signed)
Pt is A/Ox4; VSS; on room air and sats above 95%; pt blood tranfusion ended at 2300; tolerated well; on adverse reactions; has TPN running at 188m/ hr; with regular BG checks and insulin coverage; pt denies any complaints; per pt all needs have been met at this time. Bed is locked in lowest position, 3 side rails are up; call bell is within reach.    Problem: Education: Goal: Knowledge of risk factors and measures for prevention of condition will improve Outcome: Progressing   Problem: Coping: Goal: Psychosocial and spiritual needs will be supported Outcome: Progressing   Problem: Respiratory: Goal: Will maintain a patent airway Outcome: Progressing Goal: Complications related to the disease process, condition or treatment will be avoided or minimized Outcome: Progressing   Problem: Education: Goal: Knowledge of General Education information will improve Description: Including pain rating scale, medication(s)/side effects and non-pharmacologic comfort measures Outcome: Progressing   Problem: Health Behavior/Discharge Planning: Goal: Ability to manage health-related needs will improve Outcome: Progressing   Problem: Clinical Measurements: Goal: Ability to maintain clinical measurements within normal limits will improve Outcome: Progressing Goal: Will remain free from infection Outcome: Progressing Goal: Diagnostic test results will improve Outcome: Progressing Goal: Respiratory complications will improve Outcome: Progressing Goal: Cardiovascular complication will be avoided Outcome: Progressing   Problem: Activity: Goal: Risk for activity intolerance will decrease Outcome: Progressing   Problem: Nutrition: Goal: Adequate nutrition will be maintained Outcome: Progressing   Problem: Coping: Goal: Level of anxiety will decrease Outcome: Progressing   Problem: Elimination: Goal: Will not experience complications related to bowel motility Outcome: Progressing Goal:  Will not experience complications related to urinary retention Outcome: Progressing   Problem: Pain Managment: Goal: General experience of comfort will improve Outcome: Progressing   Problem: Safety: Goal: Ability to remain free from injury will improve Outcome: Progressing   Problem: Skin Integrity: Goal: Risk for impaired skin integrity will decrease Outcome: Progressing

## 2020-11-21 NOTE — Progress Notes (Signed)
PROGRESS NOTE    Eric [name]  ZOX:[id number] DOB: September 11, 1971 DOA: 11/15/2020 PCP: Practice, [facility]   Brief Narrative:  Eric [name] is a 50 y.o. male no known medical history. Patient presented secondary to right groin pain and found to have a retroperitoneal abscess/bowel perforation/cecal mass concerning for neoplasm. Incidental finding of COVID-19 positive status. General surgery consulted   Subjective:  He denies any pain at the biopsy site, or in the right thigh area, tolerating clear diet,  Assessment & Plan:   Principal Problem:   Retroperitoneal abscess ([place]) Active Problems:   Colonic mass   Iron deficiency anemia due to chronic blood loss   Sepsis ([place])   COVID-19 virus infection   Thrombocytosis   Hyponatremia   Protein-calorie malnutrition, severe (HCC)   Liver mass, right lobe   Right groin pain   Ambulates with cane   Tobacco abuse   Abdominal pain, RLQ (right lower quadrant)   Fatigue   Symptomatic anemia   Malnutrition of moderate degree   Sepsis secondary to perforated colon mass with retroperitoneal abscess -Sepsis present on admission -Abscess culture showing Enterobacter aerogenes, continue with IV Zosyn -  percutaneous drain per IR placed on 2/7, TPN - Lesion concerning for cancer, at some point requiring surgical resection, possibly this admission. -Continue with TPN.  Liver lesions -Concerning for metastasis versus infection, status post IR biopsy 2/9, cultures and pathology is pending  Thrombocytosis -Due to infection/inflammation, reactive, improving  -DVT prophylaxis   COVID-19 infection - Incidental. Asymptomatic. On room air.  Treated with 3 days of remdesivir  Iron deficiency anemia -Likely related to colonic mass, chronic inflammation in the setting of infectious process, requesting his PRBC transfusion on admission . -Choose another 2 units PRBC 2/9 with good response, hemoglobin is 9 this morning  .  Hyponatremia Mild, Asymptomatic  Tobacco abuse Smoking cessation discussed. -Continue nicotine    DVT prophylaxis: [place] heparin Code Status:   Code Status: Full Code Family Communication: None at bedside, I have discussed with patient at length and answered all his questions. Disposition Plan: Discharge likely in several days pending general surgery recommendations for management.   Consultants:   General surgery  Interventional radiology  Procedures:  percutaneous drain per IR placed on 2/7, TPN IR biopsy of hepatic lesion 2/9  Antimicrobials:  Zosyn IV      Objective: Vitals:   11/20/20 1940 11/20/20 2015 11/20/20 2045 11/21/20 0400  BP: (!) 142/82 134/90 128/86 119/81  Pulse: 95 94 94 83  Resp: 16 17 16 20   Temp: 98.3 F (36.8 C) 98.2 F (36.8 C) 98.5 F (36.9 C) 98.5 F (36.9 C)  TempSrc: Oral Oral Oral Oral  SpO2: 97% 97% 96% 96%  Weight:      Height:        Intake/Output Summary (Last 24 hours) at 11/21/2020 1411 Last data filed at 11/21/2020 8119 Gross per 24 hour  Intake 2734.38 ml  Output 2450 ml  Net 284.38 ml   Filed Weights   11/15/20 2000  Weight: 69.4 kg    Examination:  Awake Alert, Oriented X 3, No new F.N deficits, Normal affect Symmetrical Chest wall movement, Good air movement bilaterally, CTAB RRR,No Gallops,Rubs or new Murmurs, No Parasternal Heave +ve B.Sounds, Abd Soft, flank JP drain with brown liquid with clotted blood No Cyanosis, Clubbing or edema, No new Rash or bruise      Data Reviewed: I have personally reviewed following labs and imaging studies  CBC Lab  Results  Component Value Date   WBC 13.1 (H) 11/21/2020   RBC 4.02 (L) 11/21/2020   HGB 9.0 (L) 11/21/2020   HCT 29.0 (L) 11/21/2020   MCV 72.1 (L) 11/21/2020   MCH 22.4 (L) 11/21/2020   PLT 869 (H) 11/21/2020   MCHC 31.0 11/21/2020   RDW 28.1 (H) 11/21/2020   LYMPHSABS 1.4 11/20/2020   MONOABS 0.7 11/20/2020   EOSABS 0.1 11/20/2020   BASOSABS 0.1  41/66/0630     Last metabolic panel Lab Results  Component Value Date   NA 133 (L) 11/21/2020   K 4.6 11/21/2020   CL 102 11/21/2020   CO2 23 11/21/2020   BUN 14 11/21/2020   CREATININE 0.57 (L) 11/21/2020   GLUCOSE 142 (H) 11/21/2020   GFRNONAA >60 11/21/2020   CALCIUM 8.3 (L) 11/21/2020   PHOS 3.9 11/21/2020   PROT 6.0 (L) 11/21/2020   ALBUMIN 1.7 (L) 11/21/2020   BILITOT 0.5 11/21/2020   ALKPHOS 74 11/21/2020   AST 35 11/21/2020   ALT 36 11/21/2020   ANIONGAP 8 11/21/2020    CBG (last 3)  Recent Labs    11/21/20 0358 11/21/20 0717 11/21/20 1158  GLUCAP 145* 139* 141*     GFR: Estimated Creatinine Clearance: 107.2 mL/min (A) (by C-G formula based on SCr of 0.57 mg/dL (L)).  Coagulation Profile: Recent Labs  Lab 11/15/20 1520  INR 1.2    Recent Results (from the past 240 hour(s))  SARS Coronavirus 2 by RT PCR (hospital order, performed in [facility] hospital lab) Nasopharyngeal Nasopharyngeal Swab     Status: Abnormal   Collection Time: 11/15/20 12:41 PM   Specimen: Nasopharyngeal Swab  Result Value Ref Range Status   SARS Coronavirus 2 POSITIVE (A) NEGATIVE Final    Comment: RESULT CALLED TO, READ BACK BY AND VERIFIED WITH: E SHAFFER RN 1352 11/15/20 A BROWNING (NOTE) SARS-CoV-2 target nucleic acids are DETECTED  SARS-CoV-2 RNA is generally detectable in upper respiratory specimens  during the acute phase of infection.  Positive results are indicative  of the presence of the identified virus, but do not rule out bacterial infection or co-infection with other pathogens not detected by the test.  Clinical correlation with patient history and  other diagnostic information is necessary to determine patient infection status.  The expected result is negative.  Fact Sheet for Patients:   StrictlyIdeas.no   Fact Sheet for Healthcare Providers:   BankingDealers.co.za    This test is not yet approved or cleared  by the [place] FDA and  has been authorized for detection and/or diagnosis of SARS-CoV-2 by FDA under an Emergency Use Authorization (EUA).  This EUA will remain in effect (meaning this t est can be used) for the duration of  the COVID-19 declaration under Section 564(b)(1) of the Act, 21 U.S.C. section 360-bbb-3(b)(1), unless the authorization is terminated or revoked sooner.  Performed at [facility], [place] [address], [place], [place] [address]   Blood Culture (routine x 2)     Status: None   Collection Time: 11/15/20  1:52 PM   Specimen: BLOOD RIGHT FOREARM  Result Value Ref Range Status   Specimen Description BLOOD RIGHT FOREARM  Final   Special Requests   Final    BOTTLES DRAWN AEROBIC AND ANAEROBIC Blood Culture adequate volume   Culture   Final    NO GROWTH 5 DAYS Performed at [facility], [place] [address], [place], [place] [address]    Report Status 11/20/2020 FINAL  Final  Blood Culture (routine x 2)     Status: None   Collection Time: 11/15/20  2:13 PM   Specimen: BLOOD LEFT FOREARM  Result Value Ref Range Status   Specimen Description BLOOD LEFT FOREARM  Final   Special Requests   Final    BOTTLES DRAWN AEROBIC AND ANAEROBIC Blood Culture adequate volume   Culture   Final    NO GROWTH 5 DAYS Performed at [facility], [address], [place], [place] [address]    Report Status 11/20/2020 FINAL  Final  Aerobic/Anaerobic Culture (surgical/deep wound)     Status: None (Preliminary result)   Collection Time: 11/18/20  4:34 PM   Specimen: Abscess  Result Value Ref Range Status   Specimen Description ABSCESS  Final   Special Requests ABDOMEN  Final   Gram Stain   Final    FEW WBC PRESENT, PREDOMINANTLY PMN MODERATE GRAM NEGATIVE RODS FEW GRAM POSITIVE COCCI IN PAIRS IN CHAINS FEW GRAM POSITIVE RODS    Culture   Final    FEW ENTEROBACTER AEROGENES MODERATE SUSCEPTIBILITIES TO FOLLOW Beta hemolytic streptococci are predictably susceptible to  penicillin and other beta lactams. Susceptibility testing not routinely performed. HOLDING FOR POSSIBLE ANAEROBE Performed at [facility], [place] [address], [place], [place] [address]    Report Status PENDING  Incomplete   Organism ID, Bacteria ENTEROBACTER AEROGENES  Final      Susceptibility   Enterobacter aerogenes - MIC*    CEFAZOLIN >=64 RESISTANT Resistant     CEFEPIME <=0.12 SENSITIVE Sensitive     CEFTAZIDIME <=1 SENSITIVE Sensitive     CEFTRIAXONE <=0.25 SENSITIVE Sensitive     CIPROFLOXACIN <=0.25 SENSITIVE Sensitive     GENTAMICIN <=1 SENSITIVE Sensitive     IMIPENEM 0.5 SENSITIVE Sensitive     TRIMETH/SULFA <=20 SENSITIVE Sensitive     PIP/TAZO <=4 SENSITIVE Sensitive     * FEW ENTEROBACTER AEROGENES  Aerobic/Anaerobic Culture (surgical/deep wound)     Status: None (Preliminary result)   Collection Time: 11/20/20  4:22 PM   Specimen: Biopsy; Tissue  Result Value Ref Range Status   Specimen Description BIOPSY LIVER  Final   Special Requests Normal  Final   Gram Stain   Final    RARE WBC PRESENT, PREDOMINANTLY PMN NO ORGANISMS SEEN    Culture   Final    NO GROWTH < 24 HOURS Performed at [facility], [place] [address], [place], [place] [address]    Report Status PENDING  Incomplete        Radiology Studies: MR PELVIS W WO CONTRAST  Result Date: 11/20/2020 CLINICAL DATA:  History of pelvic abscess. 3.5 cm liver lesion on CT. Scrotal pain. EXAM: MRI ABDOMEN AND PELVIS WITHOUT AND WITH CONTRAST TECHNIQUE: Multiplanar multisequence MR imaging of the abdomen and pelvis was performed both before and after the administration of intravenous contrast. CONTRAST:  54mL GADAVIST GADOBUTROL 1 MMOL/ML IV SOLN COMPARISON:  CT scan 11/15/2020. FINDINGS: COMBINED FINDINGS FOR BOTH MR ABDOMEN AND PELVIS Lower chest: Unremarkable. Hepatobiliary: As noted on recent CT scan, there is a 3.5 x 2.5 cm heterogeneous lesion in the inferior right liver peripherally. This shows irregular  peripheral and heterogeneous central enhancement after IV contrast administration. T2 imaging shows a surrounding halo of edema. No substantial restricted diffusion. A similar lesion is identified in the medial subcapsular right liver (segment VI) measuring 1.9 x 2.1 cm (see postcontrast image 48 of series 26). 1 additional irregular rim enhancing lesion  is seen in the inferior left liver (segment IV B) visible on image 59 of series 26. 17 mm simple cyst identified lateral segment left liver. Several additional 3-4 mm hypoenhancing foci are seen scattered in the liver parenchyma, much too small to characterize but likely benign and may be tiny cysts or biliary hamartomas. There is no evidence for gallstones, gallbladder wall thickening, or pericholecystic fluid. Probable adenomyomatosis at the fundus. No intrahepatic or extrahepatic biliary dilation. Pancreas: No focal mass lesion. No dilatation of the main duct. No intraparenchymal cyst. No peripancreatic edema. Spleen:  No splenomegaly. No focal mass lesion. Adrenals/Urinary Tract: No adrenal nodule or mass. Kidneys unremarkable. No hydroureter. Bladder unremarkable. Stomach/Bowel: Stomach is unremarkable. No gastric wall thickening. No evidence of outlet obstruction. Duodenum is normally positioned as is the ligament of Treitz. Mild diffuse distention of small bowel evident. Marked wall thickening again noted in the cecum. Colon is diffusely distended and stool-filled as before. Vascular/Lymphatic: No abdominal aortic aneurysm There is no gastrohepatic or hepatoduodenal ligament lymphadenopathy. No retroperitoneal or mesenteric lymphadenopathy. No pelvic sidewall lymphadenopathy. Reproductive: The prostate gland and seminal vesicles are unremarkable. Although not a dedicated scrotal exam, there is no evidence for testicular mass lesion or hydrocele. Other:  No substantial intraperitoneal free fluid. Musculoskeletal: Right iliopsoas abscess again noted with  percutaneous drainage catheter now in place with distal loop formed in the psoas component of the process. Interval decrease in size of the rim enhancing fluid collection seen extending down anterior to the right hip as before. No suspicious focal marrow abnormality. IMPRESSION: 1. 3.5 x 2.5 cm heterogeneous enhancing lesion in the inferior peripheral right liver with 2 additional smaller lesions having similar imaging features. Given the large right retroperitoneal abscess, intrahepatic abscesses would be a distinct consideration. Metastatic disease also a consideration. Tissue sampling likely warranted. 2. Interval decrease in size of the rim enhancing right iliopsoas fluid collection status post percutaneous drain placement. 3. Marked wall thickening again noted in the cecum with diffuse distention of small bowel loops. These findings are better characterized on the recent CT scan. Electronically Signed   By: Misty Stanley M.D.   On: 11/20/2020 05:53   MR ABDOMEN W WO CONTRAST  Result Date: 11/20/2020 CLINICAL DATA:  History of pelvic abscess. 3.5 cm liver lesion on CT. Scrotal pain. EXAM: MRI ABDOMEN AND PELVIS WITHOUT AND WITH CONTRAST TECHNIQUE: Multiplanar multisequence MR imaging of the abdomen and pelvis was performed both before and after the administration of intravenous contrast. CONTRAST:  89mL GADAVIST GADOBUTROL 1 MMOL/ML IV SOLN COMPARISON:  CT scan 11/15/2020. FINDINGS: COMBINED FINDINGS FOR BOTH MR ABDOMEN AND PELVIS Lower chest: Unremarkable. Hepatobiliary: As noted on recent CT scan, there is a 3.5 x 2.5 cm heterogeneous lesion in the inferior right liver peripherally. This shows irregular peripheral and heterogeneous central enhancement after IV contrast administration. T2 imaging shows a surrounding halo of edema. No substantial restricted diffusion. A similar lesion is identified in the medial subcapsular right liver (segment VI) measuring 1.9 x 2.1 cm (see postcontrast image 48 of series  26). 1 additional irregular rim enhancing lesion is seen in the inferior left liver (segment IV B) visible on image 59 of series 26. 17 mm simple cyst identified lateral segment left liver. Several additional 3-4 mm hypoenhancing foci are seen scattered in the liver parenchyma, much too small to characterize but likely benign and may be tiny cysts or biliary hamartomas. There is no evidence for gallstones, gallbladder wall thickening, or pericholecystic fluid. Probable adenomyomatosis at  the fundus. No intrahepatic or extrahepatic biliary dilation. Pancreas: No focal mass lesion. No dilatation of the main duct. No intraparenchymal cyst. No peripancreatic edema. Spleen:  No splenomegaly. No focal mass lesion. Adrenals/Urinary Tract: No adrenal nodule or mass. Kidneys unremarkable. No hydroureter. Bladder unremarkable. Stomach/Bowel: Stomach is unremarkable. No gastric wall thickening. No evidence of outlet obstruction. Duodenum is normally positioned as is the ligament of Treitz. Mild diffuse distention of small bowel evident. Marked wall thickening again noted in the cecum. Colon is diffusely distended and stool-filled as before. Vascular/Lymphatic: No abdominal aortic aneurysm There is no gastrohepatic or hepatoduodenal ligament lymphadenopathy. No retroperitoneal or mesenteric lymphadenopathy. No pelvic sidewall lymphadenopathy. Reproductive: The prostate gland and seminal vesicles are unremarkable. Although not a dedicated scrotal exam, there is no evidence for testicular mass lesion or hydrocele. Other:  No substantial intraperitoneal free fluid. Musculoskeletal: Right iliopsoas abscess again noted with percutaneous drainage catheter now in place with distal loop formed in the psoas component of the process. Interval decrease in size of the rim enhancing fluid collection seen extending down anterior to the right hip as before. No suspicious focal marrow abnormality. IMPRESSION: 1. 3.5 x 2.5 cm heterogeneous  enhancing lesion in the inferior peripheral right liver with 2 additional smaller lesions having similar imaging features. Given the large right retroperitoneal abscess, intrahepatic abscesses would be a distinct consideration. Metastatic disease also a consideration. Tissue sampling likely warranted. 2. Interval decrease in size of the rim enhancing right iliopsoas fluid collection status post percutaneous drain placement. 3. Marked wall thickening again noted in the cecum with diffuse distention of small bowel loops. These findings are better characterized on the recent CT scan. Electronically Signed   By: Misty Stanley M.D.   On: 11/20/2020 05:53   US BIOPSY (LIVER)  Result Date: 11/20/2020 INDICATION: 50 year old gentleman with right liver mass/abscess presents to interventional radiology for ultrasound-guided biopsy EXAM: Ultrasound-guided biopsy of right liver lesion MEDICATIONS: None. ANESTHESIA/SEDATION: Moderate (conscious) sedation was employed during this procedure. A total of Versed 1 mg and Fentanyl 25 mcg was administered intravenously. Moderate Sedation Time: 15 minutes. The patient's level of consciousness and vital signs were monitored continuously by radiology nursing throughout the procedure under my direct supervision. COMPLICATIONS: None immediate. PROCEDURE: Informed written consent was obtained from the patient after a thorough discussion of the procedural risks, benefits and alternatives. All questions were addressed. Maximal Sterile Barrier Technique was utilized including caps, mask, sterile gowns, sterile gloves, sterile drape, hand hygiene and skin antiseptic. A timeout was performed prior to the initiation of the procedure. Patient positioned supine on the ultrasound table. Right upper quadrant skin prepped and draped in the usual fashion. Following local lidocaine administration, 17 gauge introducer needle was advanced into the right hepatic lobe, and five 18 gauge core were obtained  utilizing continuous ultrasound guidance. One core sent in sterile saline for Gram stain and culture in the remaining sent in formalin for histologic analysis. Needle removed and hemostasis achieved with 5 minutes of manual compression. IMPRESSION: Ultrasound-guided biopsy of right liver lesion with sample sent for both pathology and Gram stain and culture. Electronically Signed   By: Miachel Roux M.D.   On: 11/20/2020 16:28      Scheduled Meds: . Chlorhexidine Gluconate Cloth  6 each Topical Daily  . feeding supplement  237 mL Oral TID  . heparin injection (subcutaneous)  5,000 Units Subcutaneous Q8H  . influenza vac split quadrivalent PF  0.5 mL Intramuscular Tomorrow-1000  . insulin aspart  0-15 Units  Subcutaneous Q4H  . lip balm  1 application Topical BID  . nicotine  21 mg Transdermal Daily  . polycarbophil  625 mg Oral BID  . sodium chloride flush  10-40 mL Intracatheter Q12H  . sodium chloride flush  5 mL Intracatheter Q8H   Continuous Infusions: . sodium chloride 10 mL/hr at 11/21/20 0642  . methocarbamol (ROBAXIN) IV    . piperacillin-tazobactam (ZOSYN)  IV 3.375 g (11/21/20 0634)  . TPN ADULT (ION) 100 mL/hr at 11/20/20 1820  . TPN ADULT (ION)       LOS: 6 days     Cordelia Poche, MD [facility] 11/21/2020, 2:11 PM  If 7PM-7AM, please contact night-coverage [email or web address]

## 2020-11-21 NOTE — Progress Notes (Signed)
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   I met today with Eric [name] at bedside. He is in good spirits. He reports that he had no problem with liver biopsy and would rather repeat biopsy than have another MRI. He is feeling very well and reports pain very well controlled and has more energy. Appetite is improved and tolerating full liquids and he is anxiously anticipating when he can have real food. He has no concerns. He understands plan to await biopsy results before a true treatment plan can be developed. No plans for surgical intervention at this time unless he declines and necessary.   I called and updated sister, Eric [name], at [place] request. Explained the above to Eric [name]. She is aware that we wait for biopsy results to know if we are treating infection vs malignancy. She had a few questions from other family members which I answered. Of note, Eric [name] has also expressed desire to stop smoking since he has gone this long without a cigarette! I very much supported and encouraged this idea and Eric [name] does as well.   All questions/concerns addressed. Emotional support provided.   Exam: Alert, oriented. No distress. Good understand of plan of care. No distress. Breathing regular, unlabored. Abd flat, soft. R flank JP with bloody drainage.   Plan: - Awaiting results from biopsy.  - Desires aggressive care but would not desire to be prolonged long term on life support.   25 min  Vinie Sill, NP Palliative Medicine Team Pager [phone] (Please see [email or web address] for schedule) Team Phone [phone]    Greater than 50%  of this time was spent counseling and coordinating care related to the above assessment and plaOn

## 2020-11-21 NOTE — Progress Notes (Signed)
PHARMACY - TOTAL PARENTERAL NUTRITION CONSULT NOTE   Indication: Perforated right colon into retroperitoneum  Patient Measurements: Height: 6' (182.9 cm) Weight: 69.4 kg (153 lb) IBW/kg (Calculated) : 77.6 TPN AdjBW (KG): 69.4 Body mass index is 20.75 kg/m. Usual Weight: 170 lbs  Assessment: Eric [name] is a 50 y.o. male no known medical history. Patient presented secondary to right groin pain and found to have a retroperitoneal abscess/bowel perforation/cecal mass concerning for neoplasm. Incidental finding of COVID-19 positive status. Surgery following, no surgical interventions at this time. Plans for short term management with drains and IV abx. Plan for surgical resection of R colon/retroperitoneal mass ideally in 6 weeks for more optimal surgical conditions.    Glucose / Insulin: No history of diabetes. No A1c available. CBGs 116 -160. Utilized 11 units / 24 hrs Electrolytes: Na 13 . K 4.6. Other electrolytes wnl.  Renal: Scr 0.57 - stable. BUN wnl.  LFTs / TGs: AST/ALT and Tbili wnl. TG 91 (2/4).  Prealbumin / albumin: Prealbumin 5.8. Albumin 1.7.  Intake / Output; MIVF: 2.2 ml/kg/hr. NS dec to [facility] on 2/8 ~1400. R JP drain 100 ml/24 hrs.  GI Imaging:  2/4 CT abd: long segment masslike thickening on cecum concerning for neoplasm, R retroperitoneal abscess 2/9 MRI abd: lesion in peripheral R liver - possible intrahepatic abscess vs. Metastatic disease. Dec fluid in R iliopsoas.  Surgeries / Procedures:  2/7: R retroperitoneal drain placed Central access: 11/18/20 TPN start date: 11/18/20  Nutritional Goals (per RD recommendation on 2/5): Kcal:  2200-2430; Protein:  110-125; Fluid:  >/= 2.2 L Goal TPN rate is 100 mL/hr (provides 120 g of protein and an average of 2328 kcals per day - with 21 g/L lipids on MWF)  Current Nutrition:  Clear liquid diet started 2/8 PM - likely advance to full liquid diet today Ensure TID - consumes 100%  TPN  Plan:  Increase TPN to goal rate of  100 mL/hr at 1800 - adjusted goal rate and components slightly to fit all components in TPN bag; this will provide 2112 kcal and 120g of protein Discussed with RD and patient meets criteria for moderate malnutrition and will hold lipids for 7-10 days  Electrolytes in TPN: Na to 100 mEq/L, K 30 mEq/L, Mg 3 mEq/L, phos 15 mmol/L, Ca 5 mEq/L. Continue Cl:Ac 2:1.  Add standard MVI and trace elements to TPN Continue Moderate q4h SSI and adjust as needed - no changes for now, might need to adjust tomorrow Monitor TPN labs on Mon/Thurs Follow up toleration of advancement of diet and plans for weaning TPN - per Surgery note, advance to full liquid diet  Alanda Slim, PharmD, [facility] Clinical Pharmacist Please see AMION for all Pharmacists' Contact Phone Numbers 11/21/2020, 7:10 AM

## 2020-11-22 ENCOUNTER — Encounter (HOSPITAL_COMMUNITY): Payer: Self-pay | Admitting: Internal Medicine

## 2020-11-22 DIAGNOSIS — K6819 Other retroperitoneal abscess: Secondary | ICD-10-CM

## 2020-11-22 LAB — BASIC METABOLIC PANEL
Anion gap: 8 (ref 5–15)
BUN: 16 mg/dL (ref 6–20)
CO2: 22 mmol/L (ref 22–32)
Calcium: 8.3 mg/dL — ABNORMAL LOW (ref 8.9–10.3)
Chloride: 101 mmol/L (ref 98–111)
Creatinine, Ser: 0.57 mg/dL — ABNORMAL LOW (ref 0.61–1.24)
GFR, Estimated: >60 mL/min (ref >60)
Glucose, Bld: 134 mg/dL — ABNORMAL HIGH (ref 70–99)
Potassium: 4.5 mmol/L (ref 3.5–5.1)
Sodium: 131 mmol/L — ABNORMAL LOW (ref 135–145)

## 2020-11-22 LAB — GLUCOSE, CAPILLARY
Glucose-Capillary: 134 mg/dL — ABNORMAL HIGH (ref 70–99)
Glucose-Capillary: 135 mg/dL — ABNORMAL HIGH (ref 70–99)
Glucose-Capillary: 139 mg/dL — ABNORMAL HIGH (ref 70–99)
Glucose-Capillary: 145 mg/dL — ABNORMAL HIGH (ref 70–99)
Glucose-Capillary: 145 mg/dL — ABNORMAL HIGH (ref 70–99)
Glucose-Capillary: 157 mg/dL — ABNORMAL HIGH (ref 70–99)

## 2020-11-22 LAB — CBC
HCT: 30.5 % — ABNORMAL LOW (ref 39.0–52.0)
Hemoglobin: 8.9 g/dL — ABNORMAL LOW (ref 13.0–17.0)
MCH: 21.8 pg — ABNORMAL LOW (ref 26.0–34.0)
MCHC: 29.2 g/dL — ABNORMAL LOW (ref 30.0–36.0)
MCV: 74.8 fL — ABNORMAL LOW (ref 80.0–100.0)
Platelets: 922 10*3/uL (ref 150–400)
RBC: 4.08 MIL/uL — ABNORMAL LOW (ref 4.22–5.81)
RDW: 29.4 % — ABNORMAL HIGH (ref 11.5–15.5)
WBC: 17.9 10*3/uL — ABNORMAL HIGH (ref 4.0–10.5)
nRBC: 0 % (ref 0.0–0.2)

## 2020-11-22 LAB — PHOSPHORUS: Phosphorus: 4.2 mg/dL (ref 2.5–4.6)

## 2020-11-22 LAB — MAGNESIUM: Magnesium: 2.2 mg/dL (ref 1.7–2.4)

## 2020-11-22 LAB — SURGICAL PATHOLOGY

## 2020-11-22 MED ORDER — SACCHAROMYCES BOULARDII 250 MG PO CAPS
250.0000 mg | ORAL_CAPSULE | Freq: Two times a day (BID) | ORAL | Status: DC
  Administered 2020-11-22 – 2020-11-29 (×15): 250 mg via ORAL
  Filled 2020-11-22 (×15): qty 1

## 2020-11-22 MED ORDER — ENSURE ENLIVE PO LIQD
237.0000 mL | Freq: Four times a day (QID) | ORAL | Status: DC
  Administered 2020-11-22 – 2020-11-29 (×19): 237 mL via ORAL
  Filled 2020-11-22 (×5): qty 237

## 2020-11-22 MED ORDER — TRAVASOL 10 % IV SOLN
INTRAVENOUS | Status: AC
  Filled 2020-11-22: qty 600

## 2020-11-22 NOTE — Consult Note (Addendum)
[place]  [phone] [phone]   MEDICAL ONCOLOGY - INITIAL CONSULTATION  Referral MD: Dr. Phillips Climes  Reason for Referral: Colon adenocarcinoma  HPI: Eric [name] is a 50 year old male with a past medical history significant for tobacco use.  He presented to the emergency room for evaluation of right groin pain.  Pain had been present for approximately 5 days and radiated down his right thigh.  He was also having chills and night sweats.  He has had a weight loss of about 15 pounds over the past 3 months.  Admission lab work showed a WBC of 21.4, hemoglobin 5.4, MCV 62.3, platelet count 1,165,000, albumin 1.9. Ferritin was 64, iron 8 TIBC 228, percent saturation 4%.  Incidentally found to be COVID-19 positive.  CT of the abdomen pelvis showed a masslike mural thickening of the cecum concerning for neoplasm, posterior perforation at the site cecal wall thickening with multilocular right retroperitoneal abscess which extends into the right psoas and right iliacus muscles, indeterminate hypodensity in the inferior right lobe of the liver.  MRI of the abdomen and pelvis showed a 3.5 x 2.5 cm heterogeneous enhancing lesion in the inferior peripheral right liver with 2 additional smaller lesions having similar imaging features-differentials include intrahepatic abscess versus metastatic disease.  Ultrasound-guided liver biopsy was performed on 11/20/2020 which was consistent with adenocarcinoma.  CEA level on 11/19/2020 was normal at 0.8.  General surgery is following and there is no need for emergent surgery.  Eric [name] was seen and examined today.  He reports that he is feeling better.  He was having pain in his right groin which radiated down to his right thigh for about 5 days prior to admission.  He has not had any abdominal pain, nausea, vomiting.  He reports constipation today.  He has not noticed any change in his stools and no melena or hematochezia was reported.   The patient states that he has never had a colonoscopy.  He denies fevers but reported that he had chills at home.  He denies chest pain and shortness of breath but reports a nonproductive cough.  He denies headaches and dizziness.  No lower extremity edema reported.  The patient is single.  He does not have any children.  He has several siblings that live locally.  Reports rare alcohol use.  He smokes 1.5 to 2 packs of cigarettes per day.  Also smokes marijuana.  Family history significant for a sister with colon cancer.  Medical oncology was asked see the patient to make recommendations regarding his newly diagnosed adenocarcinoma of the colon.   History reviewed. No pertinent past medical history.:  History reviewed. No pertinent surgical history.:  Current Facility-Administered Medications  Medication Dose Route Frequency Provider Last Rate Last Admin  . 0.9 %  sodium chloride infusion   Intravenous Continuous Michael Boston, MD 10 mL/hr at 11/21/20 0642 New Bag at 11/21/20 4540  . acetaminophen (TYLENOL) suppository 650 mg  650 mg Rectal Q6H PRN Shela Leff, MD   650 mg at 11/16/20 1808  . acetaminophen (TYLENOL) tablet 325-650 mg  325-650 mg Oral Q6H PRN Michael Boston, MD   650 mg at 11/22/20 0640  . alum & mag hydroxide-simeth (MAALOX/MYLANTA) 200-200-20 MG/5ML suspension 30 mL  30 mL Oral Q6H PRN Michael Boston, MD      . Chlorhexidine Gluconate Cloth 2 % PADS 6 each  6 each Topical Daily Mariel Aloe, MD   6 each at 11/22/20 1045  . diphenhydrAMINE (  BENADRYL) injection 12.5-25 mg  12.5-25 mg Intravenous Q6H PRN Karie Soda, MD      . feeding supplement (ENSURE ENLIVE / ENSURE PLUS) liquid 237 mL  237 mL Oral TID Chevis Pretty III, MD   237 mL at 11/22/20 0937  . heparin injection 5,000 Units  5,000 Units Subcutaneous Q8H Elgergawy, Leana Roe, MD   5,000 Units at 11/22/20 0528  . HYDROmorphone (DILAUDID) injection 0.5-2 mg  0.5-2 mg Intravenous Q4H PRN Elgergawy, Leana Roe, MD      .  influenza vac split quadrivalent PF (FLUARIX) injection 0.5 mL  0.5 mL Intramuscular Tomorrow-1000 Narda Bonds, MD      . insulin aspart (novoLOG) injection 0-15 Units  0-15 Units Subcutaneous Q4H Narda Bonds, MD   2 Units at 11/22/20 1218  . lip balm (CARMEX) ointment 1 application  1 application Topical BID Karie Soda, MD   1 application at 11/22/20 [phone]  . magic mouthwash  15 mL Oral QID PRN Karie Soda, MD      . melatonin tablet 5 mg  5 mg Oral QHS PRN John Giovanni, MD   5 mg at 11/17/20 2224  . menthol-cetylpyridinium (CEPACOL) lozenge 3 mg  1 lozenge Oral PRN Karie Soda, MD      . methocarbamol (ROBAXIN) 1,000 mg in dextrose 5 % 100 mL IVPB  1,000 mg Intravenous Q6H PRN Karie Soda, MD      . nicotine (NICODERM CQ - dosed in mg/24 hours) patch 21 mg  21 mg Transdermal Daily Charlsie Quest, MD   21 mg at 11/22/20 0936  . ondansetron (ZOFRAN) tablet 4 mg  4 mg Oral Q6H PRN Charlsie Quest, MD       Or  . ondansetron (ZOFRAN) injection 4 mg  4 mg Intravenous Q6H PRN Darreld Mclean R, MD      . phenol (CHLORASEPTIC) mouth spray 2 spray  2 spray Mouth/Throat PRN Karie Soda, MD      . piperacillin-tazobactam (ZOSYN) IVPB 3.375 g  3.375 g Intravenous Q8H Darreld Mclean R, MD 12.5 mL/hr at 11/22/20 0639 3.375 g at 11/22/20 0639  . polycarbophil (FIBERCON) tablet 625 mg  625 mg Oral BID Karie Soda, MD   625 mg at 11/22/20 0802  . prochlorperazine (COMPAZINE) injection 5-10 mg  5-10 mg Intravenous Q4H PRN Karie Soda, MD      . saccharomyces boulardii (FLORASTOR) capsule 250 mg  250 mg Oral BID Elgergawy, Leana Roe, MD   250 mg at 11/22/20 0802  . simethicone (MYLICON) 40 MG/0.6ML suspension 40 mg  40 mg Oral QID PRN Karie Soda, MD      . sodium chloride flush (NS) 0.9 % injection 10-40 mL  10-40 mL Intracatheter Catha Gosselin, MD   10 mL at 11/22/20 1045  . sodium chloride flush (NS) 0.9 % injection 10-40 mL  10-40 mL Intracatheter PRN Karie Soda, MD      .  sodium chloride flush (NS) 0.9 % injection 5 mL  5 mL Intracatheter Q8H Suttle, Thressa Sheller, MD   5 mL at 11/22/20 0528  . TPN ADULT (ION)   Intravenous Continuous TPN Elgergawy, Leana Roe, MD 100 mL/hr at 11/21/20 1742 New Bag at 11/21/20 1742  . TPN ADULT (ION)   Intravenous Continuous TPN Elgergawy, Leana Roe, MD         No Known Allergies:  Family History  Problem Relation Age of Onset  . Diabetes Sister   :  Social History   Socioeconomic  History  . Marital status: Single    Spouse name: Not on file  . Number of children: Not on file  . Years of education: Not on file  . Highest education level: Not on file  Occupational History  . Not on file  Tobacco Use  . Smoking status: Current Every Day Smoker    Packs/day: 0.50    Years: 30.00    Pack years: 15.00    Types: Cigarettes  . Smokeless tobacco: Never Used  . Tobacco comment: Up to 1.5 PPD since age 39  Substance and Sexual Activity  . Alcohol use: Not Currently  . Drug use: Yes    Types: Marijuana  . Sexual activity: Not on file  Other Topics Concern  . Not on file  Social History Narrative  . Not on file   Social Determinants of Health   Financial Resource Strain: Not on file  Food Insecurity: Not on file  Transportation Needs: Not on file  Physical Activity: Not on file  Stress: Not on file  Social Connections: Not on file  Intimate Partner Violence: Not on file  :  Review of Systems: A comprehensive 14 point review of systems was negative except as noted in the HPI.  Exam: Patient Vitals for the past 24 hrs:  BP Temp Temp src Pulse Resp SpO2  11/22/20 1156 126/83 99 F (37.2 C) Oral 96 19 96 %  11/22/20 0400 125/83 98.4 F (36.9 C) Oral 83 17 96 %  11/21/20 2000 123/80 98.1 F (36.7 C) Oral 88 17 97 %    General: Awake and alert, no distress Eyes: EOMI, PERRL, no scleral icterus.   ENT: Poor dentition, no thrush or mucositis.  Lymphatics: No cervical or supraclavicular adenopathy.  Shotty right  axillary adenopathy.  No left axillary adenopathy. Respiratory: lungs were clear bilaterally without wheezing or crackles.   Cardiovascular:  Regular rate and rhythm. There was no pedal edema.   GI:  abdomen was soft, flat, nontender, nondistended, without organomegaly.   Musculoskeletal:  no spinal tenderness of palpation of vertebral spine.  JP drain with purulent fluid. Skin exam was without echymosis, petichae.   Neuro exam was nonfocal. Patient was alert and oriented.  Attention was good.  Language was appropriate.  Mood was normal without depression.  Speech was not pressured.  Thought content was not tangential.     Lab Results  Component Value Date   WBC 17.9 (H) 11/22/2020   HGB 8.9 (L) 11/22/2020   HCT 30.5 (L) 11/22/2020   PLT 922 (HH) 11/22/2020   GLUCOSE 134 (H) 11/22/2020   TRIG 94 11/19/2020   ALT 36 11/21/2020   AST 35 11/21/2020   NA 131 (L) 11/22/2020   K 4.5 11/22/2020   CL 101 11/22/2020   CREATININE 0.57 (L) 11/22/2020   BUN 16 11/22/2020   CO2 22 11/22/2020    CT Angio Chest PE W and/or Wo Contrast  Result Date: 11/15/2020 CLINICAL DATA:  Positive D-dimer, COVID-19 positive, tobacco abuse, right lower quadrant abdominal pain EXAM: CT ANGIOGRAPHY CHEST CT ABDOMEN AND PELVIS WITH CONTRAST TECHNIQUE: Multidetector CT imaging of the chest was performed using the standard protocol during bolus administration of intravenous contrast. Multiplanar CT image reconstructions and MIPs were obtained to evaluate the vascular anatomy. Multidetector CT imaging of the abdomen and pelvis was performed using the standard protocol during bolus administration of intravenous contrast. CONTRAST:  68mL OMNIPAQUE IOHEXOL 350 MG/ML SOLN COMPARISON:  11/15/2020 FINDINGS: CTA CHEST FINDINGS Cardiovascular: This is  a technically adequate evaluation of the pulmonary vasculature. No filling defects or pulmonary emboli. The heart is unremarkable without pericardial effusion. Mild atherosclerosis  within the LAD distribution of the coronary vasculature. Normal caliber of the thoracic aorta without evidence of dissection. Mediastinum/Nodes: Mild wall thickening of the distal thoracic esophagus is nonspecific and could reflect esophagitis. Thyroid and trachea are unremarkable. No pathologic adenopathy. Lungs/Pleura: No acute airspace disease, effusion, or pneumothorax. Mild upper lobe predominant emphysema with areas of subpleural scarring. Central airways are patent. Musculoskeletal: No acute or destructive bony lesions. Reconstructed images demonstrate no additional findings. Review of the MIP images confirms the above findings. CT ABDOMEN and PELVIS FINDINGS Hepatobiliary: There is an indeterminate hypodensity within the inferior aspect right lobe liver, measuring approximately 3.5 x 2.5 cm reference image 36/4. No other focal liver abnormalities. The gallbladder is unremarkable. No intrahepatic duct dilation. Pancreas: Unremarkable. No pancreatic ductal dilatation or surrounding inflammatory changes. Spleen: Normal in size without focal abnormality. Adrenals/Urinary Tract: Adrenal glands are unremarkable. Kidneys are normal, without renal calculi, focal lesion, or hydronephrosis. Bladder is unremarkable. Stomach/Bowel: There is masslike wall thickening involving the cecum, extending approximately 9 cm in length. There is evidence of cecal perforation posteriorly, with a complex multiloculated retroperitoneal fluid collection. There are intramuscular components of the fluid collection within the right psoas and iliacus muscles, with a fluid collection extending into the proximal aspect of the iliopsoas tendon. This collection measures approximately 9.1 x 6.7 cm in transverse dimension, and extends approximately 18 cm in craniocaudal length. Differential diagnosis includes neoplasm or severe colitis. A normal gas-filled appendix is seen in the right lower quadrant. Vascular/Lymphatic: No discrete adenopathy  within the abdomen or pelvis. Moderate atherosclerosis of the aorta. Reproductive: Prostate is unremarkable. Other: There is no free intraperitoneal fluid or free gas. No abdominal wall hernia. Musculoskeletal: No acute or destructive bony lesions. Reconstructed images demonstrate no additional findings. Review of the MIP images confirms the above findings. IMPRESSION: 1. Long segment masslike mural thickening of the cecum, concerning for neoplasm. 2. Posterior perforation at the site of cecal wall thickening, with multilocular right retroperitoneal abscess which extends into the right psoas and right iliacus muscles as above. 3. Indeterminate hypodensity inferior right lobe liver. Dedicated nonemergent outpatient liver MRI could be performed for definitive characterization. 4. No evidence of pulmonary embolus. 5. Mild wall thickening of the distal thoracic esophagus, which could reflect mild esophagitis. 6. Aortic Atherosclerosis (ICD10-I70.0) and Emphysema (ICD10-J43.9). Critical Value/emergent results were called by telephone at the time of interpretation on 11/15/2020 at 4:16 pm to provider PA Lanora Manis, who verbally acknowledged these results. Electronically Signed   By: Sharlet Salina M.D.   On: 11/15/2020 16:22   MR PELVIS W WO CONTRAST  Result Date: 11/20/2020 CLINICAL DATA:  History of pelvic abscess. 3.5 cm liver lesion on CT. Scrotal pain. EXAM: MRI ABDOMEN AND PELVIS WITHOUT AND WITH CONTRAST TECHNIQUE: Multiplanar multisequence MR imaging of the abdomen and pelvis was performed both before and after the administration of intravenous contrast. CONTRAST:  72mL GADAVIST GADOBUTROL 1 MMOL/ML IV SOLN COMPARISON:  CT scan 11/15/2020. FINDINGS: COMBINED FINDINGS FOR BOTH MR ABDOMEN AND PELVIS Lower chest: Unremarkable. Hepatobiliary: As noted on recent CT scan, there is a 3.5 x 2.5 cm heterogeneous lesion in the inferior right liver peripherally. This shows irregular peripheral and heterogeneous central  enhancement after IV contrast administration. T2 imaging shows a surrounding halo of edema. No substantial restricted diffusion. A similar lesion is identified in the medial subcapsular right liver (segment  VI) measuring 1.9 x 2.1 cm (see postcontrast image 48 of series 26). 1 additional irregular rim enhancing lesion is seen in the inferior left liver (segment IV B) visible on image 59 of series 26. 17 mm simple cyst identified lateral segment left liver. Several additional 3-4 mm hypoenhancing foci are seen scattered in the liver parenchyma, much too small to characterize but likely benign and may be tiny cysts or biliary hamartomas. There is no evidence for gallstones, gallbladder wall thickening, or pericholecystic fluid. Probable adenomyomatosis at the fundus. No intrahepatic or extrahepatic biliary dilation. Pancreas: No focal mass lesion. No dilatation of the main duct. No intraparenchymal cyst. No peripancreatic edema. Spleen:  No splenomegaly. No focal mass lesion. Adrenals/Urinary Tract: No adrenal nodule or mass. Kidneys unremarkable. No hydroureter. Bladder unremarkable. Stomach/Bowel: Stomach is unremarkable. No gastric wall thickening. No evidence of outlet obstruction. Duodenum is normally positioned as is the ligament of Treitz. Mild diffuse distention of small bowel evident. Marked wall thickening again noted in the cecum. Colon is diffusely distended and stool-filled as before. Vascular/Lymphatic: No abdominal aortic aneurysm There is no gastrohepatic or hepatoduodenal ligament lymphadenopathy. No retroperitoneal or mesenteric lymphadenopathy. No pelvic sidewall lymphadenopathy. Reproductive: The prostate gland and seminal vesicles are unremarkable. Although not a dedicated scrotal exam, there is no evidence for testicular mass lesion or hydrocele. Other:  No substantial intraperitoneal free fluid. Musculoskeletal: Right iliopsoas abscess again noted with percutaneous drainage catheter now in place  with distal loop formed in the psoas component of the process. Interval decrease in size of the rim enhancing fluid collection seen extending down anterior to the right hip as before. No suspicious focal marrow abnormality. IMPRESSION: 1. 3.5 x 2.5 cm heterogeneous enhancing lesion in the inferior peripheral right liver with 2 additional smaller lesions having similar imaging features. Given the large right retroperitoneal abscess, intrahepatic abscesses would be a distinct consideration. Metastatic disease also a consideration. Tissue sampling likely warranted. 2. Interval decrease in size of the rim enhancing right iliopsoas fluid collection status post percutaneous drain placement. 3. Marked wall thickening again noted in the cecum with diffuse distention of small bowel loops. These findings are better characterized on the recent CT scan. Electronically Signed   By: Misty Stanley M.D.   On: 11/20/2020 05:53   MR ABDOMEN W WO CONTRAST  Result Date: 11/20/2020 CLINICAL DATA:  History of pelvic abscess. 3.5 cm liver lesion on CT. Scrotal pain. EXAM: MRI ABDOMEN AND PELVIS WITHOUT AND WITH CONTRAST TECHNIQUE: Multiplanar multisequence MR imaging of the abdomen and pelvis was performed both before and after the administration of intravenous contrast. CONTRAST:  88mL GADAVIST GADOBUTROL 1 MMOL/ML IV SOLN COMPARISON:  CT scan 11/15/2020. FINDINGS: COMBINED FINDINGS FOR BOTH MR ABDOMEN AND PELVIS Lower chest: Unremarkable. Hepatobiliary: As noted on recent CT scan, there is a 3.5 x 2.5 cm heterogeneous lesion in the inferior right liver peripherally. This shows irregular peripheral and heterogeneous central enhancement after IV contrast administration. T2 imaging shows a surrounding halo of edema. No substantial restricted diffusion. A similar lesion is identified in the medial subcapsular right liver (segment VI) measuring 1.9 x 2.1 cm (see postcontrast image 48 of series 26). 1 additional irregular rim enhancing  lesion is seen in the inferior left liver (segment IV B) visible on image 59 of series 26. 17 mm simple cyst identified lateral segment left liver. Several additional 3-4 mm hypoenhancing foci are seen scattered in the liver parenchyma, much too small to characterize but likely benign and may be tiny  cysts or biliary hamartomas. There is no evidence for gallstones, gallbladder wall thickening, or pericholecystic fluid. Probable adenomyomatosis at the fundus. No intrahepatic or extrahepatic biliary dilation. Pancreas: No focal mass lesion. No dilatation of the main duct. No intraparenchymal cyst. No peripancreatic edema. Spleen:  No splenomegaly. No focal mass lesion. Adrenals/Urinary Tract: No adrenal nodule or mass. Kidneys unremarkable. No hydroureter. Bladder unremarkable. Stomach/Bowel: Stomach is unremarkable. No gastric wall thickening. No evidence of outlet obstruction. Duodenum is normally positioned as is the ligament of Treitz. Mild diffuse distention of small bowel evident. Marked wall thickening again noted in the cecum. Colon is diffusely distended and stool-filled as before. Vascular/Lymphatic: No abdominal aortic aneurysm There is no gastrohepatic or hepatoduodenal ligament lymphadenopathy. No retroperitoneal or mesenteric lymphadenopathy. No pelvic sidewall lymphadenopathy. Reproductive: The prostate gland and seminal vesicles are unremarkable. Although not a dedicated scrotal exam, there is no evidence for testicular mass lesion or hydrocele. Other:  No substantial intraperitoneal free fluid. Musculoskeletal: Right iliopsoas abscess again noted with percutaneous drainage catheter now in place with distal loop formed in the psoas component of the process. Interval decrease in size of the rim enhancing fluid collection seen extending down anterior to the right hip as before. No suspicious focal marrow abnormality. IMPRESSION: 1. 3.5 x 2.5 cm heterogeneous enhancing lesion in the inferior peripheral  right liver with 2 additional smaller lesions having similar imaging features. Given the large right retroperitoneal abscess, intrahepatic abscesses would be a distinct consideration. Metastatic disease also a consideration. Tissue sampling likely warranted. 2. Interval decrease in size of the rim enhancing right iliopsoas fluid collection status post percutaneous drain placement. 3. Marked wall thickening again noted in the cecum with diffuse distention of small bowel loops. These findings are better characterized on the recent CT scan. Electronically Signed   By: Kennith Center M.D.   On: 11/20/2020 05:53   CT ABDOMEN PELVIS W CONTRAST  Result Date: 11/15/2020 CLINICAL DATA:  Positive D-dimer, COVID-19 positive, tobacco abuse, right lower quadrant abdominal pain EXAM: CT ANGIOGRAPHY CHEST CT ABDOMEN AND PELVIS WITH CONTRAST TECHNIQUE: Multidetector CT imaging of the chest was performed using the standard protocol during bolus administration of intravenous contrast. Multiplanar CT image reconstructions and MIPs were obtained to evaluate the vascular anatomy. Multidetector CT imaging of the abdomen and pelvis was performed using the standard protocol during bolus administration of intravenous contrast. CONTRAST:  59mL OMNIPAQUE IOHEXOL 350 MG/ML SOLN COMPARISON:  11/15/2020 FINDINGS: CTA CHEST FINDINGS Cardiovascular: This is a technically adequate evaluation of the pulmonary vasculature. No filling defects or pulmonary emboli. The heart is unremarkable without pericardial effusion. Mild atherosclerosis within the LAD distribution of the coronary vasculature. Normal caliber of the thoracic aorta without evidence of dissection. Mediastinum/Nodes: Mild wall thickening of the distal thoracic esophagus is nonspecific and could reflect esophagitis. Thyroid and trachea are unremarkable. No pathologic adenopathy. Lungs/Pleura: No acute airspace disease, effusion, or pneumothorax. Mild upper lobe predominant emphysema  with areas of subpleural scarring. Central airways are patent. Musculoskeletal: No acute or destructive bony lesions. Reconstructed images demonstrate no additional findings. Review of the MIP images confirms the above findings. CT ABDOMEN and PELVIS FINDINGS Hepatobiliary: There is an indeterminate hypodensity within the inferior aspect right lobe liver, measuring approximately 3.5 x 2.5 cm reference image 36/4. No other focal liver abnormalities. The gallbladder is unremarkable. No intrahepatic duct dilation. Pancreas: Unremarkable. No pancreatic ductal dilatation or surrounding inflammatory changes. Spleen: Normal in size without focal abnormality. Adrenals/Urinary Tract: Adrenal glands are unremarkable. Kidneys are normal, without  renal calculi, focal lesion, or hydronephrosis. Bladder is unremarkable. Stomach/Bowel: There is masslike wall thickening involving the cecum, extending approximately 9 cm in length. There is evidence of cecal perforation posteriorly, with a complex multiloculated retroperitoneal fluid collection. There are intramuscular components of the fluid collection within the right psoas and iliacus muscles, with a fluid collection extending into the proximal aspect of the iliopsoas tendon. This collection measures approximately 9.1 x 6.7 cm in transverse dimension, and extends approximately 18 cm in craniocaudal length. Differential diagnosis includes neoplasm or severe colitis. A normal gas-filled appendix is seen in the right lower quadrant. Vascular/Lymphatic: No discrete adenopathy within the abdomen or pelvis. Moderate atherosclerosis of the aorta. Reproductive: Prostate is unremarkable. Other: There is no free intraperitoneal fluid or free gas. No abdominal wall hernia. Musculoskeletal: No acute or destructive bony lesions. Reconstructed images demonstrate no additional findings. Review of the MIP images confirms the above findings. IMPRESSION: 1. Long segment masslike mural thickening of  the cecum, concerning for neoplasm. 2. Posterior perforation at the site of cecal wall thickening, with multilocular right retroperitoneal abscess which extends into the right psoas and right iliacus muscles as above. 3. Indeterminate hypodensity inferior right lobe liver. Dedicated nonemergent outpatient liver MRI could be performed for definitive characterization. 4. No evidence of pulmonary embolus. 5. Mild wall thickening of the distal thoracic esophagus, which could reflect mild esophagitis. 6. Aortic Atherosclerosis (ICD10-I70.0) and Emphysema (ICD10-J43.9). Critical Value/emergent results were called by telephone at the time of interpretation on 11/15/2020 at 4:16 pm to provider [place], who verbally acknowledged these results. Electronically Signed   By: Randa Ngo M.D.   On: 11/15/2020 16:22   US BIOPSY (LIVER)  Result Date: 11/20/2020 INDICATION: 50 year old gentleman with right liver mass/abscess presents to interventional radiology for ultrasound-guided biopsy EXAM: Ultrasound-guided biopsy of right liver lesion MEDICATIONS: None. ANESTHESIA/SEDATION: Moderate (conscious) sedation was employed during this procedure. A total of Versed 1 mg and Fentanyl 25 mcg was administered intravenously. Moderate Sedation Time: 15 minutes. The patient's level of consciousness and vital signs were monitored continuously by radiology nursing throughout the procedure under my direct supervision. COMPLICATIONS: None immediate. PROCEDURE: Informed written consent was obtained from the patient after a thorough discussion of the procedural risks, benefits and alternatives. All questions were addressed. Maximal Sterile Barrier Technique was utilized including caps, mask, sterile gowns, sterile gloves, sterile drape, hand hygiene and skin antiseptic. A timeout was performed prior to the initiation of the procedure. Patient positioned supine on the ultrasound table. Right upper quadrant skin prepped and draped in the  usual fashion. Following local lidocaine administration, 17 gauge introducer needle was advanced into the right hepatic lobe, and five 18 gauge core were obtained utilizing continuous ultrasound guidance. One core sent in sterile saline for Gram stain and culture in the remaining sent in formalin for histologic analysis. Needle removed and hemostasis achieved with 5 minutes of manual compression. IMPRESSION: Ultrasound-guided biopsy of right liver lesion with sample sent for both pathology and Gram stain and culture. Electronically Signed   By: Miachel Roux M.D.   On: 11/20/2020 16:28   DG Chest Portable 1 View  Result Date: 11/15/2020 CLINICAL DATA:  COVID EXAM: PORTABLE CHEST 1 VIEW COMPARISON:  None. FINDINGS: Evaluation is limited secondary to patient rotation. The cardiomediastinal silhouette is normal in contour. Blunting of the RIGHT costophrenic angle. No pneumothorax. No acute pleuroparenchymal abnormality. Visualized abdomen is unremarkable. No acute osseous abnormality noted. IMPRESSION: Blunting of the RIGHT costophrenic angle may reflect a small pleural  effusion. Electronically Signed   By: Valentino Saxon MD   On: 11/15/2020 13:27   CT IMAGE GUIDED DRAINAGE BY PERCUTANEOUS CATHETER  Result Date: 11/19/2020 INDICATION: 50 year old male with history of cecal perforation with fluid collection in the right retroperitoneum. Suspected underlying neoplastic etiology. EXAM: CT IMAGE GUIDED DRAINAGE BY PERCUTANEOUS CATHETER COMPARISON:  CT abdomen pelvis from 11/15/2020 MEDICATIONS: The patient is currently admitted to the hospital and receiving intravenous antibiotics. The antibiotics were administered within an appropriate time frame prior to the initiation of the procedure. ANESTHESIA/SEDATION: Moderate (conscious) sedation was employed during this procedure. A total of Versed 1 mg and Fentanyl 25 mcg was administered intravenously. Moderate Sedation Time: 21 minutes. The patient's level of  consciousness and vital signs were monitored continuously by radiology nursing throughout the procedure under my direct supervision. CONTRAST:  None COMPLICATIONS: None immediate. PROCEDURE: Informed written consent was obtained from the patient after a discussion of the risks, benefits and alternatives to treatment. The patient was placed supine on the CT gantry and a pre procedural CT was performed re-demonstrating the known abscess/fluid collection within the right retroperitoneum. The procedure was planned. A timeout was performed prior to the initiation of the procedure. The right lower quadrant was prepped and draped in the usual sterile fashion. The overlying soft tissues were anesthetized with 1% lidocaine with epinephrine. Appropriate trajectory was planned with the use of a 22 gauge spinal needle. An 18 gauge trocar needle was advanced into the abscess/fluid collection and a short Amplatz super stiff wire was coiled within the collection. Appropriate positioning was confirmed with a limited CT scan. The tract was serially dilated allowing placement of a 14 French all-purpose drainage catheter. Appropriate positioning was confirmed with a limited postprocedural CT scan. Approximately 400 mL ml of purulent fluid was aspirated. The tube was connected to a bulb suction and sutured in place. A dressing was placed. The patient tolerated the procedure well without immediate post procedural complication. IMPRESSION: Successful CT guided placement of a 93 French all purpose drain catheter into the right retroperitoneum with aspiration of approximately 400 mL of purulent fluid. Samples were sent to the laboratory for culture as well as cytology given suspicion for underlying neoplastic etiology. Ruthann Cancer, MD [facility] Electronically Signed   By: Ruthann Cancer MD   On: 11/19/2020 08:01   [place] EKG SITE RITE  Result Date: 11/18/2020 If Site Rite image  not attached, placement could not be confirmed due to current cardiac rhythm.    CT Angio Chest PE W and/or Wo Contrast  Result Date: 11/15/2020 CLINICAL DATA:  Positive D-dimer, COVID-19 positive, tobacco abuse, right lower quadrant abdominal pain EXAM: CT ANGIOGRAPHY CHEST CT ABDOMEN AND PELVIS WITH CONTRAST TECHNIQUE: Multidetector CT imaging of the chest was performed using the standard protocol during bolus administration of intravenous contrast. Multiplanar CT image reconstructions and MIPs were obtained to evaluate the vascular anatomy. Multidetector CT imaging of the abdomen and pelvis was performed using the standard protocol during bolus administration of intravenous contrast. CONTRAST:  12mL OMNIPAQUE IOHEXOL 350 MG/ML SOLN COMPARISON:  11/15/2020 FINDINGS: CTA CHEST FINDINGS Cardiovascular: This is a technically adequate evaluation of the pulmonary vasculature. No filling defects or pulmonary emboli. The heart is unremarkable without pericardial effusion. Mild atherosclerosis within the LAD distribution of the coronary vasculature. Normal caliber of the thoracic aorta without evidence of dissection. Mediastinum/Nodes: Mild wall thickening of the distal thoracic esophagus is nonspecific and could reflect esophagitis. Thyroid and trachea are unremarkable.  No pathologic adenopathy. Lungs/Pleura: No acute airspace disease, effusion, or pneumothorax. Mild upper lobe predominant emphysema with areas of subpleural scarring. Central airways are patent. Musculoskeletal: No acute or destructive bony lesions. Reconstructed images demonstrate no additional findings. Review of the MIP images confirms the above findings. CT ABDOMEN and PELVIS FINDINGS Hepatobiliary: There is an indeterminate hypodensity within the inferior aspect right lobe liver, measuring approximately 3.5 x 2.5 cm reference image 36/4. No other focal liver abnormalities. The gallbladder is unremarkable. No intrahepatic duct dilation. Pancreas:  Unremarkable. No pancreatic ductal dilatation or surrounding inflammatory changes. Spleen: Normal in size without focal abnormality. Adrenals/Urinary Tract: Adrenal glands are unremarkable. Kidneys are normal, without renal calculi, focal lesion, or hydronephrosis. Bladder is unremarkable. Stomach/Bowel: There is masslike wall thickening involving the cecum, extending approximately 9 cm in length. There is evidence of cecal perforation posteriorly, with a complex multiloculated retroperitoneal fluid collection. There are intramuscular components of the fluid collection within the right psoas and iliacus muscles, with a fluid collection extending into the proximal aspect of the iliopsoas tendon. This collection measures approximately 9.1 x 6.7 cm in transverse dimension, and extends approximately 18 cm in craniocaudal length. Differential diagnosis includes neoplasm or severe colitis. A normal gas-filled appendix is seen in the right lower quadrant. Vascular/Lymphatic: No discrete adenopathy within the abdomen or pelvis. Moderate atherosclerosis of the aorta. Reproductive: Prostate is unremarkable. Other: There is no free intraperitoneal fluid or free gas. No abdominal wall hernia. Musculoskeletal: No acute or destructive bony lesions. Reconstructed images demonstrate no additional findings. Review of the MIP images confirms the above findings. IMPRESSION: 1. Long segment masslike mural thickening of the cecum, concerning for neoplasm. 2. Posterior perforation at the site of cecal wall thickening, with multilocular right retroperitoneal abscess which extends into the right psoas and right iliacus muscles as above. 3. Indeterminate hypodensity inferior right lobe liver. Dedicated nonemergent outpatient liver MRI could be performed for definitive characterization. 4. No evidence of pulmonary embolus. 5. Mild wall thickening of the distal thoracic esophagus, which could reflect mild esophagitis. 6. Aortic  Atherosclerosis (ICD10-I70.0) and Emphysema (ICD10-J43.9). Critical Value/emergent results were called by telephone at the time of interpretation on 11/15/2020 at 4:16 pm to provider Fairlea, who verbally acknowledged these results. Electronically Signed   By: Randa Ngo M.D.   On: 11/15/2020 16:22   MR PELVIS W WO CONTRAST  Result Date: 11/20/2020 CLINICAL DATA:  History of pelvic abscess. 3.5 cm liver lesion on CT. Scrotal pain. EXAM: MRI ABDOMEN AND PELVIS WITHOUT AND WITH CONTRAST TECHNIQUE: Multiplanar multisequence MR imaging of the abdomen and pelvis was performed both before and after the administration of intravenous contrast. CONTRAST:  15mL GADAVIST GADOBUTROL 1 MMOL/ML IV SOLN COMPARISON:  CT scan 11/15/2020. FINDINGS: COMBINED FINDINGS FOR BOTH MR ABDOMEN AND PELVIS Lower chest: Unremarkable. Hepatobiliary: As noted on recent CT scan, there is a 3.5 x 2.5 cm heterogeneous lesion in the inferior right liver peripherally. This shows irregular peripheral and heterogeneous central enhancement after IV contrast administration. T2 imaging shows a surrounding halo of edema. No substantial restricted diffusion. A similar lesion is identified in the medial subcapsular right liver (segment VI) measuring 1.9 x 2.1 cm (see postcontrast image 48 of series 26). 1 additional irregular rim enhancing lesion is seen in the inferior left liver (segment IV B) visible on image 59 of series 26. 17 mm simple cyst identified lateral segment left liver. Several additional 3-4 mm hypoenhancing foci are seen scattered in the liver parenchyma, much too small to  characterize but likely benign and may be tiny cysts or biliary hamartomas. There is no evidence for gallstones, gallbladder wall thickening, or pericholecystic fluid. Probable adenomyomatosis at the fundus. No intrahepatic or extrahepatic biliary dilation. Pancreas: No focal mass lesion. No dilatation of the main duct. No intraparenchymal cyst. No peripancreatic  edema. Spleen:  No splenomegaly. No focal mass lesion. Adrenals/Urinary Tract: No adrenal nodule or mass. Kidneys unremarkable. No hydroureter. Bladder unremarkable. Stomach/Bowel: Stomach is unremarkable. No gastric wall thickening. No evidence of outlet obstruction. Duodenum is normally positioned as is the ligament of Treitz. Mild diffuse distention of small bowel evident. Marked wall thickening again noted in the cecum. Colon is diffusely distended and stool-filled as before. Vascular/Lymphatic: No abdominal aortic aneurysm There is no gastrohepatic or hepatoduodenal ligament lymphadenopathy. No retroperitoneal or mesenteric lymphadenopathy. No pelvic sidewall lymphadenopathy. Reproductive: The prostate gland and seminal vesicles are unremarkable. Although not a dedicated scrotal exam, there is no evidence for testicular mass lesion or hydrocele. Other:  No substantial intraperitoneal free fluid. Musculoskeletal: Right iliopsoas abscess again noted with percutaneous drainage catheter now in place with distal loop formed in the psoas component of the process. Interval decrease in size of the rim enhancing fluid collection seen extending down anterior to the right hip as before. No suspicious focal marrow abnormality. IMPRESSION: 1. 3.5 x 2.5 cm heterogeneous enhancing lesion in the inferior peripheral right liver with 2 additional smaller lesions having similar imaging features. Given the large right retroperitoneal abscess, intrahepatic abscesses would be a distinct consideration. Metastatic disease also a consideration. Tissue sampling likely warranted. 2. Interval decrease in size of the rim enhancing right iliopsoas fluid collection status post percutaneous drain placement. 3. Marked wall thickening again noted in the cecum with diffuse distention of small bowel loops. These findings are better characterized on the recent CT scan. Electronically Signed   By: Kennith Center M.D.   On: 11/20/2020 05:53   MR  ABDOMEN W WO CONTRAST  Result Date: 11/20/2020 CLINICAL DATA:  History of pelvic abscess. 3.5 cm liver lesion on CT. Scrotal pain. EXAM: MRI ABDOMEN AND PELVIS WITHOUT AND WITH CONTRAST TECHNIQUE: Multiplanar multisequence MR imaging of the abdomen and pelvis was performed both before and after the administration of intravenous contrast. CONTRAST:  46mL GADAVIST GADOBUTROL 1 MMOL/ML IV SOLN COMPARISON:  CT scan 11/15/2020. FINDINGS: COMBINED FINDINGS FOR BOTH MR ABDOMEN AND PELVIS Lower chest: Unremarkable. Hepatobiliary: As noted on recent CT scan, there is a 3.5 x 2.5 cm heterogeneous lesion in the inferior right liver peripherally. This shows irregular peripheral and heterogeneous central enhancement after IV contrast administration. T2 imaging shows a surrounding halo of edema. No substantial restricted diffusion. A similar lesion is identified in the medial subcapsular right liver (segment VI) measuring 1.9 x 2.1 cm (see postcontrast image 48 of series 26). 1 additional irregular rim enhancing lesion is seen in the inferior left liver (segment IV B) visible on image 59 of series 26. 17 mm simple cyst identified lateral segment left liver. Several additional 3-4 mm hypoenhancing foci are seen scattered in the liver parenchyma, much too small to characterize but likely benign and may be tiny cysts or biliary hamartomas. There is no evidence for gallstones, gallbladder wall thickening, or pericholecystic fluid. Probable adenomyomatosis at the fundus. No intrahepatic or extrahepatic biliary dilation. Pancreas: No focal mass lesion. No dilatation of the main duct. No intraparenchymal cyst. No peripancreatic edema. Spleen:  No splenomegaly. No focal mass lesion. Adrenals/Urinary Tract: No adrenal nodule or mass. Kidneys unremarkable. No  hydroureter. Bladder unremarkable. Stomach/Bowel: Stomach is unremarkable. No gastric wall thickening. No evidence of outlet obstruction. Duodenum is normally positioned as is the  ligament of Treitz. Mild diffuse distention of small bowel evident. Marked wall thickening again noted in the cecum. Colon is diffusely distended and stool-filled as before. Vascular/Lymphatic: No abdominal aortic aneurysm There is no gastrohepatic or hepatoduodenal ligament lymphadenopathy. No retroperitoneal or mesenteric lymphadenopathy. No pelvic sidewall lymphadenopathy. Reproductive: The prostate gland and seminal vesicles are unremarkable. Although not a dedicated scrotal exam, there is no evidence for testicular mass lesion or hydrocele. Other:  No substantial intraperitoneal free fluid. Musculoskeletal: Right iliopsoas abscess again noted with percutaneous drainage catheter now in place with distal loop formed in the psoas component of the process. Interval decrease in size of the rim enhancing fluid collection seen extending down anterior to the right hip as before. No suspicious focal marrow abnormality. IMPRESSION: 1. 3.5 x 2.5 cm heterogeneous enhancing lesion in the inferior peripheral right liver with 2 additional smaller lesions having similar imaging features. Given the large right retroperitoneal abscess, intrahepatic abscesses would be a distinct consideration. Metastatic disease also a consideration. Tissue sampling likely warranted. 2. Interval decrease in size of the rim enhancing right iliopsoas fluid collection status post percutaneous drain placement. 3. Marked wall thickening again noted in the cecum with diffuse distention of small bowel loops. These findings are better characterized on the recent CT scan. Electronically Signed   By: Misty Stanley M.D.   On: 11/20/2020 05:53   CT ABDOMEN PELVIS W CONTRAST  Result Date: 11/15/2020 CLINICAL DATA:  Positive D-dimer, COVID-19 positive, tobacco abuse, right lower quadrant abdominal pain EXAM: CT ANGIOGRAPHY CHEST CT ABDOMEN AND PELVIS WITH CONTRAST TECHNIQUE: Multidetector CT imaging of the chest was performed using the standard protocol  during bolus administration of intravenous contrast. Multiplanar CT image reconstructions and MIPs were obtained to evaluate the vascular anatomy. Multidetector CT imaging of the abdomen and pelvis was performed using the standard protocol during bolus administration of intravenous contrast. CONTRAST:  33mL OMNIPAQUE IOHEXOL 350 MG/ML SOLN COMPARISON:  11/15/2020 FINDINGS: CTA CHEST FINDINGS Cardiovascular: This is a technically adequate evaluation of the pulmonary vasculature. No filling defects or pulmonary emboli. The heart is unremarkable without pericardial effusion. Mild atherosclerosis within the LAD distribution of the coronary vasculature. Normal caliber of the thoracic aorta without evidence of dissection. Mediastinum/Nodes: Mild wall thickening of the distal thoracic esophagus is nonspecific and could reflect esophagitis. Thyroid and trachea are unremarkable. No pathologic adenopathy. Lungs/Pleura: No acute airspace disease, effusion, or pneumothorax. Mild upper lobe predominant emphysema with areas of subpleural scarring. Central airways are patent. Musculoskeletal: No acute or destructive bony lesions. Reconstructed images demonstrate no additional findings. Review of the MIP images confirms the above findings. CT ABDOMEN and PELVIS FINDINGS Hepatobiliary: There is an indeterminate hypodensity within the inferior aspect right lobe liver, measuring approximately 3.5 x 2.5 cm reference image 36/4. No other focal liver abnormalities. The gallbladder is unremarkable. No intrahepatic duct dilation. Pancreas: Unremarkable. No pancreatic ductal dilatation or surrounding inflammatory changes. Spleen: Normal in size without focal abnormality. Adrenals/Urinary Tract: Adrenal glands are unremarkable. Kidneys are normal, without renal calculi, focal lesion, or hydronephrosis. Bladder is unremarkable. Stomach/Bowel: There is masslike wall thickening involving the cecum, extending approximately 9 cm in length. There  is evidence of cecal perforation posteriorly, with a complex multiloculated retroperitoneal fluid collection. There are intramuscular components of the fluid collection within the right psoas and iliacus muscles, with a fluid collection extending into the proximal  aspect of the iliopsoas tendon. This collection measures approximately 9.1 x 6.7 cm in transverse dimension, and extends approximately 18 cm in craniocaudal length. Differential diagnosis includes neoplasm or severe colitis. A normal gas-filled appendix is seen in the right lower quadrant. Vascular/Lymphatic: No discrete adenopathy within the abdomen or pelvis. Moderate atherosclerosis of the aorta. Reproductive: Prostate is unremarkable. Other: There is no free intraperitoneal fluid or free gas. No abdominal wall hernia. Musculoskeletal: No acute or destructive bony lesions. Reconstructed images demonstrate no additional findings. Review of the MIP images confirms the above findings. IMPRESSION: 1. Long segment masslike mural thickening of the cecum, concerning for neoplasm. 2. Posterior perforation at the site of cecal wall thickening, with multilocular right retroperitoneal abscess which extends into the right psoas and right iliacus muscles as above. 3. Indeterminate hypodensity inferior right lobe liver. Dedicated nonemergent outpatient liver MRI could be performed for definitive characterization. 4. No evidence of pulmonary embolus. 5. Mild wall thickening of the distal thoracic esophagus, which could reflect mild esophagitis. 6. Aortic Atherosclerosis (ICD10-I70.0) and Emphysema (ICD10-J43.9). Critical Value/emergent results were called by telephone at the time of interpretation on 11/15/2020 at 4:16 pm to provider Kaser, who verbally acknowledged these results. Electronically Signed   By: Randa Ngo M.D.   On: 11/15/2020 16:22   US BIOPSY (LIVER)  Result Date: 11/20/2020 INDICATION: 50 year old gentleman with right liver mass/abscess  presents to interventional radiology for ultrasound-guided biopsy EXAM: Ultrasound-guided biopsy of right liver lesion MEDICATIONS: None. ANESTHESIA/SEDATION: Moderate (conscious) sedation was employed during this procedure. A total of Versed 1 mg and Fentanyl 25 mcg was administered intravenously. Moderate Sedation Time: 15 minutes. The patient's level of consciousness and vital signs were monitored continuously by radiology nursing throughout the procedure under my direct supervision. COMPLICATIONS: None immediate. PROCEDURE: Informed written consent was obtained from the patient after a thorough discussion of the procedural risks, benefits and alternatives. All questions were addressed. Maximal Sterile Barrier Technique was utilized including caps, mask, sterile gowns, sterile gloves, sterile drape, hand hygiene and skin antiseptic. A timeout was performed prior to the initiation of the procedure. Patient positioned supine on the ultrasound table. Right upper quadrant skin prepped and draped in the usual fashion. Following local lidocaine administration, 17 gauge introducer needle was advanced into the right hepatic lobe, and five 18 gauge core were obtained utilizing continuous ultrasound guidance. One core sent in sterile saline for Gram stain and culture in the remaining sent in formalin for histologic analysis. Needle removed and hemostasis achieved with 5 minutes of manual compression. IMPRESSION: Ultrasound-guided biopsy of right liver lesion with sample sent for both pathology and Gram stain and culture. Electronically Signed   By: Miachel Roux M.D.   On: 11/20/2020 16:28   DG Chest Portable 1 View  Result Date: 11/15/2020 CLINICAL DATA:  COVID EXAM: PORTABLE CHEST 1 VIEW COMPARISON:  None. FINDINGS: Evaluation is limited secondary to patient rotation. The cardiomediastinal silhouette is normal in contour. Blunting of the RIGHT costophrenic angle. No pneumothorax. No acute pleuroparenchymal abnormality.  Visualized abdomen is unremarkable. No acute osseous abnormality noted. IMPRESSION: Blunting of the RIGHT costophrenic angle may reflect a small pleural effusion. Electronically Signed   By: Valentino Saxon MD   On: 11/15/2020 13:27   CT IMAGE GUIDED DRAINAGE BY PERCUTANEOUS CATHETER  Result Date: 11/19/2020 INDICATION: 50 year old male with history of cecal perforation with fluid collection in the right retroperitoneum. Suspected underlying neoplastic etiology. EXAM: CT IMAGE GUIDED DRAINAGE BY PERCUTANEOUS CATHETER COMPARISON:  CT abdomen pelvis from  11/15/2020 MEDICATIONS: The patient is currently admitted to the hospital and receiving intravenous antibiotics. The antibiotics were administered within an appropriate time frame prior to the initiation of the procedure. ANESTHESIA/SEDATION: Moderate (conscious) sedation was employed during this procedure. A total of Versed 1 mg and Fentanyl 25 mcg was administered intravenously. Moderate Sedation Time: 21 minutes. The patient's level of consciousness and vital signs were monitored continuously by radiology nursing throughout the procedure under my direct supervision. CONTRAST:  None COMPLICATIONS: None immediate. PROCEDURE: Informed written consent was obtained from the patient after a discussion of the risks, benefits and alternatives to treatment. The patient was placed supine on the CT gantry and a pre procedural CT was performed re-demonstrating the known abscess/fluid collection within the right retroperitoneum. The procedure was planned. A timeout was performed prior to the initiation of the procedure. The right lower quadrant was prepped and draped in the usual sterile fashion. The overlying soft tissues were anesthetized with 1% lidocaine with epinephrine. Appropriate trajectory was planned with the use of a 22 gauge spinal needle. An 18 gauge trocar needle was advanced into the abscess/fluid collection and a short Amplatz super stiff wire was coiled  within the collection. Appropriate positioning was confirmed with a limited CT scan. The tract was serially dilated allowing placement of a 14 French all-purpose drainage catheter. Appropriate positioning was confirmed with a limited postprocedural CT scan. Approximately 400 mL ml of purulent fluid was aspirated. The tube was connected to a bulb suction and sutured in place. A dressing was placed. The patient tolerated the procedure well without immediate post procedural complication. IMPRESSION: Successful CT guided placement of a 42 French all purpose drain catheter into the right retroperitoneum with aspiration of approximately 400 mL of purulent fluid. Samples were sent to the laboratory for culture as well as cytology given suspicion for underlying neoplastic etiology. Ruthann Cancer, MD [facility] Radiology Electronically Signed   By: Ruthann Cancer MD   On: 11/19/2020 08:01   [place] EKG SITE RITE  Result Date: 11/18/2020 If Site Rite image not attached, placement could not be confirmed due to current cardiac rhythm.  Assessment and Plan:  1.  Stage IV adenocarcinoma of the colon -11/15/2020 CT abdomen/pelvis with contrast-long segment masslike mural thickening of the cecum, posterior perforation at the site of the cecal wall thickening with multilocular right retroperitoneal abscess, indeterminate hypodensity in the inferior right lobe of the liver. -11/19/2020 CEA 0.8 -11/20/2020 MRI of the abdomen and pelvis with and without contrast-3.5 x 2.5 cm heterogeneous enhancing lesion in the inferior peripheral right liver with 2 smaller lesions with similar features. -11/20/2020 ultrasound-guided liver biopsy consistent with adenocarcinoma. 2.  Sepsis secondary to perforated colon 3.  Retroperitoneal abscess 4.  Protein calorie malnutrition 5.  Thrombocytosis 6.  Iron deficiency anemia 7.  COVID-19 infection 8.  Tobacco abuse  9.  Family history of colon  cancer  Eric [name] has newly diagnosed stage IV colon adenocarcinoma.  He has a perforation of the colon due to his underlying malignancy.  He also has a retroperitoneal abscess.  Discussed the diagnosis, overall prognosis, and briefly discussed treatment options with the patient.  We would not recommend proceeding with chemotherapy at this time as they would recommend resection of his primary colon mass..  Additionally, he is currently being treated for an active infection will hold off on chemotherapy until infection has resolved.  We will arrange for outpatient follow-up at the cancer center to further discuss treatment options.  We will also need to set him up for genetic testing.  The patient has iron deficiency anemia which has improved following 2 units PRBCs.  He received a dose of iron dextran on 11/18/2020.  Would recommend starting him on ferrous sulfate 325 mg twice daily.  Thrombocytosis is likely reactive to his iron deficiency anemia and inflammation.  Recommendations: 1.  Continue JP drain and antibiotics per hospitalist/general surgery. 2.  Would recommend surgical resection of his primary mass by general surgery when medically stable and nutrition improved, decision on preoperative colonoscopy per the surgical service 3.  Case will be reviewed in the GI tumor board next week. 4.  Recommend ferrous sulfate 325 mg twice daily for iron deficiency anemia. 5.  We will set the patient up for genetic testing as an outpatient. 6.  We will arrange for outpatient follow-up to further discuss treatment options. 7.  We will submit the liver biopsy tissue for Foundation 1 testing   Thank you for this referral.   Mikey Bussing, DNP, AGPCNP-BC, AOCNP  Eric [name] was interviewed and examined.  Imaging studies reviewed.  He presented with a pelvic/retroperitoneal abscess.  He has been diagnosed with metastatic colon cancer.  He appears to have a low metastatic tumor burden, so there is no urgency  to begin systemic therapy.  My initial impression is to recommend surgery prior to initiating systemic therapy.  His case will be presented at the multidisciplinary GI tumor conference next week.  He reports his sister has colon cancer.  We will submit the liver biopsy for next generation sequencing and request mismatch repair protein testing.  He will be a candidate for systemic therapy and potentially hepatic directed therapy after resection of the primary tumor.  Please call Oncology as needed.  I will check on him 11/25/2020.

## 2020-11-22 NOTE — Progress Notes (Signed)
CRITICAL VALUE ALERT  Critical Value:  922 platelet  Date & Time Notied:  11/22/20 1420  Provider Notified: [place]  Orders Received/Actions taken: MD notified. No new orders at this time.

## 2020-11-22 NOTE — Progress Notes (Signed)
Nutrition Follow-up  DOCUMENTATION CODES:   Non-severe (moderate) malnutrition in context of chronic illness  INTERVENTION:  Provide Ensure Enlive po QID, each supplement provides 350 kcal and 20 grams of protein.  Encourage PO intake.   TPN per Pharmacy.   NUTRITION DIAGNOSIS:   Moderate Malnutrition related to  (illness) as evidenced by severe muscle depletion,moderate fat depletion,moderate muscle depletion; ongoing  GOAL:   Patient will meet greater than or equal to 90% of their needs; progressi  MONITOR:   PO intake,Supplement acceptance,Skin,Weight trends,Labs,I & O's (TPN tolerance)  REASON FOR ASSESSMENT:   Malnutrition Screening Tool    ASSESSMENT:   50 year old male with history significant for tobacco abuse admitted with sepsis due to right retroperitoneal abscess and found to be positive for COVID-19. Pt who has not had any routine medical care for the last 10 years presents with 5 day onset of significant right groin pain that has been progressive and constant associated with chills and night sweats. Patient presented secondary to right groin pain and found to have a retroperitoneal abscess/bowel perforation/cecal mass concerning for neoplasm.  2/7 TPN initiated, s/p retroperitoneal abscess drain placement  2/9 Liver lesion biopsy, significant for colorectal adenocarcinoma  Pt is currently on a full liquid diet. Pt has been tolerating his po. Meal completion has been varied from 25-100%. Pt currently has Ensure ordered and has been consuming them. Per surgery MD, plans for pt to remain on a full liquid diet. TPN to be changed to half rate today which will provide 1056 kcal (48% of kcal needs) and 60 grams of protein (54% of protein needs). RD to increase Ensure to QID to aid in caloric and protein needs.   Labs and medications reviewed.   Diet Order:   Diet Order            Diet full liquid Room service appropriate? Yes; Fluid consistency: Thin  Diet effective  now                 EDUCATION NEEDS:   Not appropriate for education at this time  Skin:  Skin Assessment: Reviewed RN Assessment  Last BM:  2/7  Height:   Ht Readings from Last 1 Encounters:  11/15/20 6' (1.829 m)    Weight:   Wt Readings from Last 1 Encounters:  11/15/20 69.4 kg   BMI:  Body mass index is 20.75 kg/m.  Estimated Nutritional Needs:   Kcal:  1540-0867  Protein:  110-125 grams  Fluid:  >/= 2.2 L  Corrin Parker, MS, RD, LDN RD pager number/after hours weekend pager number on Amion.

## 2020-11-22 NOTE — Plan of Care (Signed)
Patient is resting in bed. C/o pain, given tylenol. JP intact, Drsg changed this AM. VSS. Remains on room air. Tolerating diet. Call bell within reach.   Problem: Education: Goal: Knowledge of risk factors and measures for prevention of condition will improve Outcome: Progressing   Problem: Coping: Goal: Psychosocial and spiritual needs will be supported Outcome: Progressing   Problem: Respiratory: Goal: Will maintain a patent airway Outcome: Progressing Goal: Complications related to the disease process, condition or treatment will be avoided or minimized Outcome: Progressing

## 2020-11-22 NOTE — Progress Notes (Signed)
PROGRESS NOTE    Eric [name]  IEP:[id number] DOB: July 16, 1971 DOA: 11/15/2020 PCP: [facility], [facility]   Brief Narrative:  Eric [name] is a 50 y.o. male no known medical history. Eric [name] presented secondary to right groin pain and found to have a retroperitoneal abscess/bowel perforation/cecal mass concerning for neoplasm. Incidental finding of COVID-19 positive status. General surgery consulted   Subjective:  Eric [name] denies any bowel movement yet, reports he is passing gas, no nausea, no vomiting.  Assessment & Plan:   Principal Problem:   Retroperitoneal abscess ([place]) Active Problems:   Colonic mass   Iron deficiency anemia due to chronic blood loss   Sepsis ([place])   COVID-19 virus infection   Thrombocytosis   Hyponatremia   Protein-calorie malnutrition, severe (HCC)   Liver mass, right lobe   Right groin pain   Ambulates with cane   Tobacco abuse   Abdominal pain, RLQ (right lower quadrant)   Fatigue   Symptomatic anemia   Malnutrition of moderate degree   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)   Sepsis secondary to perforated colon adenocarcinoma mass with retroperitoneal abscess over metastasis -Sepsis present on admission -Abscess culture showing Enterobacter aerogenes, continue with IV Zosyn -  percutaneous drain per IR placed on 2/7, TPN -Tolerating full liquid diet. -  continue with TPN -Liver lesion biopsy 2/9, significant for colorectal adenocarcinoma, oncology consulted. -Per general surgery recommendation for chemotherapy prior to surgery.  Protein calorie malnutrition -Continue with TPN, continue with supplement,on  full liquid diet, advance as tolerated.  Thrombocytosis -Due to infection/inflammation, reactive, improving  -DVT prophylaxis   COVID-19 infection - Incidental. Asymptomatic. On room air.  Treated with 3 days of remdesivir  Iron deficiency anemia -Likely related to colonic mass, chronic inflammation in the  setting of infectious process, requesting his PRBC transfusion on admission . -Choose another 2 units PRBC 2/9 with good response, continue to monitor CBC closely.  Hyponatremia Mild, Asymptomatic  Tobacco abuse Smoking cessation discussed. -Continue nicotine    DVT prophylaxis: Avalon heparin Code Status:   Code Status: Full Code Family Communication: None at bedside, I have discussed with Eric [name] at length and answered all his questions. Disposition Plan: Discharge likely in several days pending general surgery recommendations for management.   Consultants:   General surgery  Interventional radiology  Procedures:  percutaneous drain per IR placed on 2/7, TPN IR biopsy of hepatic lesion 2/9  Antimicrobials:  Zosyn IV      Objective: Vitals:   11/21/20 1444 11/21/20 2000 11/22/20 0400 11/22/20 1156  BP: 122/82 123/80 125/83 126/83  Pulse: 98 88 83 96  Resp: 17 17 17 19   Temp: 97.8 F (36.6 C) 98.1 F (36.7 C) 98.4 F (36.9 C) 99 F (37.2 C)  TempSrc: Oral Oral Oral Oral  SpO2: 95% 97% 96% 96%  Weight:      Height:        Intake/Output Summary (Last 24 hours) at 11/22/2020 1401 Last data filed at 11/22/2020 1033 Gross per 24 hour  Intake 3589.87 ml  Output 3445 ml  Net 144.87 ml   Filed Weights   11/15/20 2000  Weight: 69.4 kg    Examination:  Awake Alert, Oriented X 3, frail, deconditioned, no new F.N deficits, Normal affect Symmetrical Chest wall movement, Good air movement bilaterally, CTAB RRR,No Gallops,Rubs or new Murmurs, No Parasternal Heave +ve B.Sounds, Abd Soft, JP drain with  fluid and clotted blood,no tenderness. No Cyanosis, Clubbing or edema, No new Rash or  bruise    Data Reviewed: I have personally reviewed following labs and imaging studies  CBC Lab Results  Component Value Date   WBC 13.1 (H) 11/21/2020   RBC 4.02 (L) 11/21/2020   HGB 9.0 (L) 11/21/2020   HCT 29.0 (L) 11/21/2020   MCV 72.1 (L) 11/21/2020   MCH 22.4 (L)  11/21/2020   PLT 869 (H) 11/21/2020   MCHC 31.0 11/21/2020   RDW 28.1 (H) 11/21/2020   LYMPHSABS 1.4 11/20/2020   MONOABS 0.7 11/20/2020   EOSABS 0.1 11/20/2020   BASOSABS 0.1 53/66/4403     Last metabolic panel Lab Results  Component Value Date   NA 131 (L) 11/22/2020   K 4.5 11/22/2020   CL 101 11/22/2020   CO2 22 11/22/2020   BUN 16 11/22/2020   CREATININE 0.57 (L) 11/22/2020   GLUCOSE 134 (H) 11/22/2020   GFRNONAA >60 11/22/2020   CALCIUM 8.3 (L) 11/22/2020   PHOS 4.2 11/22/2020   PROT 6.0 (L) 11/21/2020   ALBUMIN 1.7 (L) 11/21/2020   BILITOT 0.5 11/21/2020   ALKPHOS 74 11/21/2020   AST 35 11/21/2020   ALT 36 11/21/2020   ANIONGAP 8 11/22/2020    CBG (last 3)  Recent Labs    11/22/20 0346 11/22/20 0732 11/22/20 1155  GLUCAP 145* 134* 135*     GFR: Estimated Creatinine Clearance: 107.2 mL/min (A) (by C-G formula based on SCr of 0.57 mg/dL (L)).  Coagulation Profile: Recent Labs  Lab 11/15/20 1520  INR 1.2    Recent Results (from the past 240 hour(s))  SARS Coronavirus 2 by RT PCR (hospital order, performed in [facility] hospital lab) Nasopharyngeal Nasopharyngeal Swab     Status: Abnormal   Collection Time: 11/15/20 12:41 PM   Specimen: Nasopharyngeal Swab  Result Value Ref Range Status   SARS Coronavirus 2 POSITIVE (A) NEGATIVE Final    Comment: RESULT CALLED TO, READ BACK BY AND VERIFIED WITH: E SHAFFER RN 1352 11/15/20 A BROWNING (NOTE) SARS-CoV-2 target nucleic acids are DETECTED  SARS-CoV-2 RNA is generally detectable in upper respiratory specimens  during the acute phase of infection.  Positive results are indicative  of the presence of the identified virus, but do not rule out bacterial infection or co-infection with other pathogens not detected by the test.  Clinical correlation with Eric [name] history and  other diagnostic information is necessary to determine Eric [name] infection status.  The expected result is negative.  Fact Sheet for  Patients:   StrictlyIdeas.no   Fact Sheet for Healthcare Providers:   BankingDealers.co.za    This test is not yet approved or cleared by the [place] FDA and  has been authorized for detection and/or diagnosis of SARS-CoV-2 by FDA under an Emergency Use Authorization (EUA).  This EUA will remain in effect (meaning this t est can be used) for the duration of  the COVID-19 declaration under Section 564(b)(1) of the Act, 21 U.S.C. section 360-bbb-3(b)(1), unless the authorization is terminated or revoked sooner.  Performed at [facility], [place] [address], [place], [place] [address]   Blood Culture (routine x 2)     Status: None   Collection Time: 11/15/20  1:52 PM   Specimen: BLOOD RIGHT FOREARM  Result Value Ref Range Status   Specimen Description BLOOD RIGHT FOREARM  Final   Special Requests   Final    BOTTLES DRAWN AEROBIC AND ANAEROBIC Blood Culture adequate volume   Culture   Final    NO GROWTH 5 DAYS Performed at  [facility], [place] [address] [place], [place] [address]    Report Status 11/20/2020 FINAL  Final  Blood Culture (routine x 2)     Status: None   Collection Time: 11/15/20  2:13 PM   Specimen: BLOOD LEFT FOREARM  Result Value Ref Range Status   Specimen Description BLOOD LEFT FOREARM  Final   Special Requests   Final    BOTTLES DRAWN AEROBIC AND ANAEROBIC Blood Culture adequate volume   Culture   Final    NO GROWTH 5 DAYS Performed at [facility], [place] [address] [place], [place] [address]    Report Status 11/20/2020 FINAL  Final  Aerobic/Anaerobic Culture (surgical/deep wound)     Status: None   Collection Time: 11/18/20  4:34 PM   Specimen: Abscess  Result Value Ref Range Status   Specimen Description ABSCESS  Final   Special Requests ABDOMEN  Final   Gram Stain   Final    FEW WBC PRESENT, PREDOMINANTLY PMN MODERATE GRAM NEGATIVE RODS FEW GRAM POSITIVE COCCI IN PAIRS IN CHAINS FEW  GRAM POSITIVE RODS    Culture   Final    FEW ENTEROBACTER AEROGENES MODERATE STREPTOCOCCUS GROUP F Beta hemolytic streptococci are predictably susceptible to penicillin and other beta lactams. Susceptibility testing not routinely performed. ABUNDANT BACTEROIDES FRAGILIS BETA LACTAMASE POSITIVE Performed at [facility], [place] [address], [place], [place] [address]    Report Status 11/21/2020 FINAL  Final   Organism ID, Bacteria ENTEROBACTER AEROGENES  Final      Susceptibility   Enterobacter aerogenes - MIC*    CEFAZOLIN >=64 RESISTANT Resistant     CEFEPIME <=0.12 SENSITIVE Sensitive     CEFTAZIDIME <=1 SENSITIVE Sensitive     CEFTRIAXONE <=0.25 SENSITIVE Sensitive     CIPROFLOXACIN <=0.25 SENSITIVE Sensitive     GENTAMICIN <=1 SENSITIVE Sensitive     IMIPENEM 0.5 SENSITIVE Sensitive     TRIMETH/SULFA <=20 SENSITIVE Sensitive     PIP/TAZO <=4 SENSITIVE Sensitive     * FEW ENTEROBACTER AEROGENES  Aerobic/Anaerobic Culture (surgical/deep wound)     Status: None (Preliminary result)   Collection Time: 11/20/20  4:22 PM   Specimen: Biopsy; Tissue  Result Value Ref Range Status   Specimen Description BIOPSY LIVER  Final   Special Requests Normal  Final   Gram Stain   Final    RARE WBC PRESENT, PREDOMINANTLY PMN NO ORGANISMS SEEN    Culture   Final    NO GROWTH 2 DAYS Performed at [facility], [place] [address] [place], [place] [address]    Report Status PENDING  Incomplete        Radiology Studies: US BIOPSY (LIVER)  Result Date: 11/20/2020 INDICATION: 50 year old gentleman with right liver mass/abscess presents to interventional radiology for ultrasound-guided biopsy EXAM: Ultrasound-guided biopsy of right liver lesion MEDICATIONS: None. ANESTHESIA/SEDATION: Moderate (conscious) sedation was employed during this procedure. A total of Versed 1 mg and Fentanyl 25 mcg was administered intravenously. Moderate Sedation Time: 15 minutes. The Eric [name]'s level of  consciousness and vital signs were monitored continuously by radiology nursing throughout the procedure under my direct supervision. COMPLICATIONS: None immediate. PROCEDURE: Informed written consent was obtained from the Eric [name] after a thorough discussion of the procedural risks, benefits and alternatives. All questions were addressed. Maximal Sterile Barrier Technique was utilized including caps, mask, sterile gowns, sterile gloves, sterile drape, hand hygiene and skin antiseptic. A timeout was performed prior to the initiation of the procedure. Eric [name] positioned  supine on the ultrasound table. Right upper quadrant skin prepped and draped in the usual fashion. Following local lidocaine administration, 17 gauge introducer needle was advanced into the right hepatic lobe, and five 18 gauge core were obtained utilizing continuous ultrasound guidance. One core sent in sterile saline for Gram stain and culture in the remaining sent in formalin for histologic analysis. Needle removed and hemostasis achieved with 5 minutes of manual compression. IMPRESSION: Ultrasound-guided biopsy of right liver lesion with sample sent for both pathology and Gram stain and culture. Electronically Signed   By: Miachel Roux M.D.   On: 11/20/2020 16:28      Scheduled Meds: . Chlorhexidine Gluconate Cloth  6 each Topical Daily  . feeding supplement  237 mL Oral TID  . heparin injection (subcutaneous)  5,000 Units Subcutaneous Q8H  . influenza vac split quadrivalent PF  0.5 mL Intramuscular Tomorrow-1000  . insulin aspart  0-15 Units Subcutaneous Q4H  . lip balm  1 application Topical BID  . nicotine  21 mg Transdermal Daily  . polycarbophil  625 mg Oral BID  . saccharomyces boulardii  250 mg Oral BID  . sodium chloride flush  10-40 mL Intracatheter Q12H  . sodium chloride flush  5 mL Intracatheter Q8H   Continuous Infusions: . sodium chloride 10 mL/hr at 11/21/20 0642  . methocarbamol (ROBAXIN) IV    .  piperacillin-tazobactam (ZOSYN)  IV 3.375 g (11/22/20 0639)  . TPN ADULT (ION) 100 mL/hr at 11/21/20 1742  . TPN ADULT (ION)       LOS: 7 days     Cordelia Poche, MD [facility] 11/22/2020, 2:01 PM  If 7PM-7AM, please contact night-coverage [email or web address]

## 2020-11-22 NOTE — Progress Notes (Signed)
Liver biopsy shows metastatic colon adenocarcinoma  Patient has been added on for GI tumor board next week.  Given his metastatic disease, most likely would benefit from starting with chemotherapy for control of metastatic disease and following.  Discussed with Dr. Marcello Moores who agrees.  Continue drain.  Try and hold off on any colectomy / surgical intervention if possible.  Still recommend colonoscopy with intraluminal biopsy to confirm expected primary unless GI tumor board feels otherwise.  He does not have obstruction or bleeding or peritonitis from his colon cancer at this time.  Perforation seems to be controlled with antibiotics and drain.  No need for emergent surgery.  Try and hold off since his biggest risk is for his metastatic disease which would be best controlled with chemotherapy.  At the very least delaying any surgery would be helpful until he can get over his Covid, quit smoking, severe malnutrition corrected, severe anemia improved.   SURGICAL PATHOLOGY  * THIS IS AN ADDENDUM REPORT *  CASE: [id number]  PATIENT: Eric [name]  Surgical Pathology Report  *Addendum *   Reason for Addendum #1: Immunohistochemistry results   Clinical History: right liver mass, abscess vs met (cm)      FINAL MICROSCOPIC DIAGNOSIS:   A. LIVER, RIGHT MASS, NEEDLE CORE BIOPSY:  - Adenocarcinoma.  - See comment.   COMMENT:  The morphology suggests metastatic colorectal adenocarcinoma.  Immunohistochemistry will be performed and reported as an addendum.  There is sufficient tissue for additional testing.    Trenika Hudson DESCRIPTION:  Received in formalin are 4 cores of gray-white to pink-brown soft  tissue, which range from 0.8 x 0.1 cm to 1.2 x 0.1 cm, and are evenly  divided among 2 blocks for routine histology.  SW 11/20/2020   Final Diagnosis performed by Claudette Laws, MD.  Electronically signed  11/21/2020  Technical component performed at [facility]. [facility], [place] [address], [place], [place] [address].  Professional component performed at [facility],  [place] [address], [place], [place] [address].  Immunohistochemistry Technical component (if applicable) was performed  at [facility]. [address] [place],  [place], [place] [address].  IMMUNOHISTOCHEMISTRY DISCLAIMER (if applicable):  Some of these immunohistochemical stains may have been developed and the  performance characteristics determine by [facility]. Some  may not have been cleared or approved by the U.S. Food and Drug  Administration. The FDA has determined that such clearance or approval  is not necessary. This test is used for clinical purposes. It should not  be regarded as investigational or for research. This laboratory is  certified under the [place]  (CLIA-88) as qualified to perform high complexity clinical laboratory  testing. The controls stained appropriately.   ADDENDUM: Immunohistochemistry shows the carcinoma is positive with  CDX2 and cytokeratin 20 and negative with cytokeratin 7. The  immunophenotype is consistent with colorectal primary. There is  sufficient tissue for additional testing.   Addendum #1 performed by Claudette Laws, MD.  Electronically signed  11/22/2020  Technical component performed at [facility]. [facility], [place] [address], [place], [place] [address].  Professional component performed at [facility],  [place] [address] [place], [place] [address].  Immunohistochemistry Technical component (if applicable) was performed  at [facility]. [address] [place],  [place],  [address].  IMMUNOHISTOCHEMISTRY DISCLAIMER (if applicable):  Some of these immunohistochemical stains may have been developed and the  performance characteristics determine by [facility]. Some  may not have been cleared or  approved by the U.S. Food and Drug  Administration. The FDA has determined that such clearance or approval  is not necessary. This test is used for clinical purposes. It should not  be regarded as investigational or for research. This laboratory is  certified under the [place]  (CLIA-88) as qualified to perform high complexity clinical laboratory  testing. The controls stained appropriately.   Adin Hector, MD, FACS, MASCRS Gastrointestinal and Minimally Invasive Surgery  [facility] [address], [place], [place] [address] [phone] Fax [phone] Main/Paging  CONTACT INFORMATION: Weekday (9AM-5PM) concerns: Call [facility] main office at [phone] Weeknight (5PM-9AM) or Weekend/Holiday concerns: Check [email or web address] for General Surgery [facility] coverage (Please, do not use SecureChat as it is not reliable communication to operating surgeons for immediate patient care)

## 2020-11-22 NOTE — Progress Notes (Signed)
Pt reported JP drain to be loose upon transfer to chair. On examination, tubing connecting to the pt was loose resulting in drainage staining dressing. Tubing also found unable to loosen from site to flush for this pm. Surgery paged. RN told to consult IR. IR called and suggested hemostat to untwist drain and that they would come up to examine pt later. New dressing placed. Will continue to monitor.

## 2020-11-22 NOTE — Progress Notes (Signed)
CC: Right lower quadrant pain/hip pain  Subjective: Patient is doing well with the full liquids.  No BM since Monday.  Objective: Vital signs in last 24 hours: Temp:  [97.8 F (36.6 C)-98.4 F (36.9 C)] 98.4 F (36.9 C) (02/11 0400) Pulse Rate:  [83-98] 83 (02/11 0400) Resp:  [17] 17 (02/11 0400) BP: (122-125)/(80-83) 125/83 (02/11 0400) SpO2:  [95 %-97 %] 96 % (02/11 0400) Last BM Date: 11/18/20 (per patient) 490 PO 3400 IV 3300 urine Drain 80 Afebrile, VSS BMP stable; K+ 4.5 Intake/Output from previous day: 02/10 0701 - 02/11 0700 In: 3839.9 [P.O.:490; I.V.:3198.7; IV Piggyback:136.1] Out: 3380 [Urine:3300; Drains:80] Intake/Output this shift: No intake/output data recorded.  General appearance: alert, cooperative and no distress Resp: clear to auscultation bilaterally and anterior GI: soft, non tender, few BS, tolerating fulls, no BM.  Drain with ongoing purulent fluid   Lab Results:  Recent Labs    11/20/20 0406 11/21/20 0345  WBC 13.7* 13.1*  HGB 7.7* 9.0*  HCT 26.0* 29.0*  PLT 855* 869*    BMET Recent Labs    11/21/20 0345 11/22/20 0345  NA 133* 131*  K 4.6 4.5  CL 102 101  CO2 23 22  GLUCOSE 142* 134*  BUN 14 16  CREATININE 0.57* 0.57*  CALCIUM 8.3* 8.3*   PT/INR No results for input(s): LABPROT, INR in the last 72 hours.  Recent Labs  Lab 11/17/20 0402 11/18/20 0408 11/19/20 0500 11/20/20 0406 11/21/20 0345  AST 12* 14* 24 36 35  ALT 14 14 17 27  36  ALKPHOS 81 71 76 80 74  BILITOT 0.6 0.4 0.5 0.4 0.5  PROT 6.1* 5.8* 5.5* 6.0* 6.0*  ALBUMIN 1.6* 1.5* 1.5* 1.7* 1.7*     Lipase  No results found for: LIPASE   Medications: . Chlorhexidine Gluconate Cloth  6 each Topical Daily  . feeding supplement  237 mL Oral TID  . heparin injection (subcutaneous)  5,000 Units Subcutaneous Q8H  . influenza vac split quadrivalent PF  0.5 mL Intramuscular Tomorrow-1000  . insulin aspart  0-15 Units Subcutaneous Q4H  . lip balm  1  application Topical BID  . nicotine  21 mg Transdermal Daily  . polycarbophil  625 mg Oral BID  . sodium chloride flush  10-40 mL Intracatheter Q12H  . sodium chloride flush  5 mL Intracatheter Q8H    Assessment/Plan IDA Hyponatremia Tobacco abuse COVID-19 positive   - CRP 21.4>>18.9>>20.2>>19.9>>12.9  -Platelets 1165K>>954K>>895K>>818K>>860K>>869 .Severe malnutrition - prealbumin 5.8 (2/8), continue TPN  Colonmass with perforationand abscess - concerning for cancer - at some point he is going to require surgical resection, but ideally this can be delayed; if he does not improve he would need surgery this admission which would likely include colon resection and ileostomy  - WBC 18.6>>21.3>>20.8>>15.7>>13.7>>13.1 Retroperitoneal abscess - s/p IR drain placement 2/7 - cx Enterobacter aerogenes - continue drain and IV zosyn Liver lesions - x3 noted on MRI, unsure if abscess vs metastatic disease.  IR liver biopsy 11/20/2020;   - A. LIVER, RIGHT MASS, NEEDLE CORE BIOPSY:  - Adenocarcinoma.  - See comment.  COMMENT:  The morphology suggests metastatic colorectal adenocarcinoma.  Immunohistochemistry will be performed and reported as an addendum.  There is sufficient tissue for additional testing.   ID -zosyn 2/4>> day 7 FEN -TPN@ 100 ml/hr/clear liquids VTE -sq heparin Foley -none    plan:  Continue full liquids, 1/2 TPN, nutrition consult, palliative already contacted, I believe oncology has been contacted but I  do not see anything in the chart.      LOS: 7 days    Eric [name] 11/22/2020 Please see Amion

## 2020-11-22 NOTE — Progress Notes (Signed)
JP tubing successfully unscrewed from port. IR called to request new JP drain tubing to replace old and loose tube. New JP drain tubing placed. Output of old drain recorded in flowsheet and disposed in biohazard. Pt resting in bed comfortably.

## 2020-11-22 NOTE — Progress Notes (Signed)
PHARMACY - TOTAL PARENTERAL NUTRITION CONSULT NOTE   Indication: Perforated right colon into retroperitoneum  Patient Measurements: Height: 6' (182.9 cm) Weight: 69.4 kg (153 lb) IBW/kg (Calculated) : 77.6 TPN AdjBW (KG): 69.4 Body mass index is 20.75 kg/m. Usual Weight: 170 lbs  Assessment: Eric [name] is a 50 y.o. male no known medical history. Patient presented secondary to right groin pain and found to have a retroperitoneal abscess/bowel perforation/cecal mass concerning for neoplasm. Incidental finding of COVID-19 positive status. Surgery following, no surgical interventions at this time. Plans for short term management with drains and IV abx. Plan for surgical resection of R colon/retroperitoneal mass ideally in 6 weeks for more optimal surgical conditions.    Glucose / Insulin: No history of diabetes. No A1c available. CBGs 130 -145. Utilized 12 units / 24 hrs Electrolytes: Na 131 . K 4.5. Other electrolytes wnl.  Renal: Scr 0.57 - stable. BUN wnl.  LFTs / TGs: AST/ALT and Tbili wnl. TG 91 (2/4).  Prealbumin / albumin: Prealbumin 5.8. Albumin 1.7.  Intake / Output; MIVF: 2 ml/kg/hr. NS dec to [facility] on 2/8 ~1400. R JP drain 80 ml/24 hrs.  GI Imaging:  2/4 CT abd: long segment masslike thickening on cecum concerning for neoplasm, R retroperitoneal abscess 2/9 MRI abd: lesion in peripheral R liver - possible intrahepatic abscess vs. Metastatic disease. Dec fluid in R iliopsoas.  Surgeries / Procedures:  2/7: R retroperitoneal drain placed Central access: 11/18/20 TPN start date: 11/18/20  Nutritional Goals (per RD recommendation on 2/5): Kcal:  2200-2430; Protein:  110-125; Fluid:  >/= 2.2 L Goal TPN rate is 100 mL/hr (provides 120 g of protein and an average of 2328 kcals per day - with 21 g/L lipids on MWF)  Current Nutrition:  Full liquid diet 2/10 Ensure TID - consumes 100%  TPN  Plan to put on half TPN  Plan:  Decrease TPN to 1/2 rate of 50 mL/hr at 1800 - adjusted goal  rate and components slightly to fit all components in TPN bag; this will provide1056 kcal and 60g of protein Hold lipids for 7-10 days  Electrolytes in TPN: Na to 100 mEq/L, K 30 mEq/L, Mg 3 mEq/L, phos 15 mmol/L, Ca 5 mEq/L. Continue Cl:Ac 2:1.  Add standard MVI and trace elements to TPN Continue Moderate q4h SSI and adjust as needed - no changes for now, might need to adjust tomorrow Monitor TPN labs on Mon/Thurs Follow up toleration of advancement of diet and plans for weaning TPN - per Surgery note,1/2 TPN  Alanda Slim, PharmD, [facility] Clinical Pharmacist Please see AMION for all Pharmacists' Contact Phone Numbers 11/22/2020, 10:28 AM

## 2020-11-23 DIAGNOSIS — C189 Malignant neoplasm of colon, unspecified: Secondary | ICD-10-CM

## 2020-11-23 DIAGNOSIS — C787 Secondary malignant neoplasm of liver and intrahepatic bile duct: Secondary | ICD-10-CM

## 2020-11-23 LAB — CBC
HCT: 30.1 % — ABNORMAL LOW (ref 39.0–52.0)
Hemoglobin: 8.9 g/dL — ABNORMAL LOW (ref 13.0–17.0)
MCH: 22.5 pg — ABNORMAL LOW (ref 26.0–34.0)
MCHC: 29.6 g/dL — ABNORMAL LOW (ref 30.0–36.0)
MCV: 76.2 fL — ABNORMAL LOW (ref 80.0–100.0)
Platelets: 855 10*3/uL — ABNORMAL HIGH (ref 150–400)
RBC: 3.95 MIL/uL — ABNORMAL LOW (ref 4.22–5.81)
RDW: 30.3 % — ABNORMAL HIGH (ref 11.5–15.5)
WBC: 9.8 10*3/uL (ref 4.0–10.5)
nRBC: 0 % (ref 0.0–0.2)

## 2020-11-23 LAB — COMPREHENSIVE METABOLIC PANEL
ALT: 43 U/L (ref 0–44)
AST: 37 U/L (ref 15–41)
Albumin: 1.9 g/dL — ABNORMAL LOW (ref 3.5–5.0)
Alkaline Phosphatase: 109 U/L (ref 38–126)
Anion gap: 8 (ref 5–15)
BUN: 15 mg/dL (ref 6–20)
CO2: 24 mmol/L (ref 22–32)
Calcium: 8.6 mg/dL — ABNORMAL LOW (ref 8.9–10.3)
Chloride: 100 mmol/L (ref 98–111)
Creatinine, Ser: 0.67 mg/dL (ref 0.61–1.24)
GFR, Estimated: >60 mL/min (ref >60)
Glucose, Bld: 124 mg/dL — ABNORMAL HIGH (ref 70–99)
Potassium: 4.4 mmol/L (ref 3.5–5.1)
Sodium: 132 mmol/L — ABNORMAL LOW (ref 135–145)
Total Bilirubin: 0.4 mg/dL (ref 0.3–1.2)
Total Protein: 6.3 g/dL — ABNORMAL LOW (ref 6.5–8.1)

## 2020-11-23 LAB — GLUCOSE, CAPILLARY
Glucose-Capillary: 124 mg/dL — ABNORMAL HIGH (ref 70–99)
Glucose-Capillary: 109 mg/dL — ABNORMAL HIGH (ref 70–99)
Glucose-Capillary: 102 mg/dL — ABNORMAL HIGH (ref 70–99)
Glucose-Capillary: 140 mg/dL — ABNORMAL HIGH (ref 70–99)
Glucose-Capillary: 149 mg/dL — ABNORMAL HIGH (ref 70–99)

## 2020-11-23 MED ORDER — TRAVASOL 10 % IV SOLN
INTRAVENOUS | Status: AC
  Filled 2020-11-23: qty 600

## 2020-11-23 MED ORDER — FERROUS SULFATE 325 (65 FE) MG PO TABS
325.0000 mg | ORAL_TABLET | Freq: Two times a day (BID) | ORAL | Status: DC
Start: 2020-11-23 — End: 2020-11-30
  Administered 2020-11-23 – 2020-11-30 (×13): 325 mg via ORAL
  Filled 2020-11-23 (×15): qty 1

## 2020-11-23 MED ORDER — INSULIN ASPART 100 UNIT/ML ~~LOC~~ SOLN
0.0000 [IU] | Freq: Three times a day (TID) | SUBCUTANEOUS | Status: AC
  Administered 2020-11-24 (×2): 2 [IU] via SUBCUTANEOUS
  Administered 2020-11-27: 3 [IU] via SUBCUTANEOUS
  Administered 2020-11-28 – 2020-12-01 (×9): 2 [IU] via SUBCUTANEOUS

## 2020-11-23 MED ORDER — FLORANEX PO PACK
1.0000 g | PACK | Freq: Three times a day (TID) | ORAL | Status: DC
  Administered 2020-11-23 – 2020-11-30 (×14): 1 g via ORAL
  Filled 2020-11-23 (×22): qty 1

## 2020-11-23 MED ORDER — SENNOSIDES-DOCUSATE SODIUM 8.6-50 MG PO TABS
1.0000 | ORAL_TABLET | Freq: Two times a day (BID) | ORAL | Status: DC
  Administered 2020-11-23 – 2020-11-24 (×3): 1 via ORAL
  Filled 2020-11-23 (×3): qty 1

## 2020-11-23 MED ORDER — INSULIN ASPART 100 UNIT/ML ~~LOC~~ SOLN: 0.0000 [IU] | Freq: Every day | SUBCUTANEOUS | Status: DC

## 2020-11-23 NOTE — Progress Notes (Signed)
PROGRESS NOTE    Eric [name]  JHE:[id number] DOB: [birth year] DOA: 11/15/2020 PCP: Practice, [facility]   Brief Narrative:  Eric [name] is a 50 y.o. male no known medical history. Patient presented secondary to right groin pain and found to have a retroperitoneal abscess/bowel perforation/cecal mass concerning for neoplasm. Incidental finding of COVID-19 positive status. General surgery consulted   Subjective:  Patient denies any bowel movement yet, reports he is passing gas, no nausea, no vomiting.  Assessment & Plan:   Principal Problem:   Retroperitoneal abscess ([place]) Active Problems:   Colonic mass   Iron deficiency anemia due to chronic blood loss   Sepsis ([place])   COVID-19 virus infection   Thrombocytosis   Hyponatremia   Protein-calorie malnutrition, severe (HCC)   Liver mass, right lobe   Right groin pain   Ambulates with cane   Tobacco abuse   Abdominal pain, RLQ (right lower quadrant)   Fatigue   Symptomatic anemia   Malnutrition of moderate degree   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)   Sepsis secondary to perforated colon adenocarcinoma mass with retroperitoneal abscess over metastasis -Sepsis present on admission -Abscess culture showing Enterobacter aerogenes, continue with IV Zosyn -  percutaneous drain per IR placed on 2/7, managed with purulent drain -Tolerating full liquid diet.  Advanced by general surgery -  continue with TPN, weaning -Liver lesion biopsy 2/9, significant for colorectal adenocarcinoma, oncology consulted.  They will follow as an outpatient, treatment will be done as an outpatient, after surgical issues and malnutrition has resolved. -Patient advance to soft diet per general surgery, will need surgical resection of mass prior to chemotherapy, likely will be done at a later date as an outpatient till nutritional status and infection improves   Protein calorie malnutrition -Continue with TPN, continue with  supplement, advance to soft diet. Thrombocytosis -Due to infection/inflammation, reactive, improving  -DVT prophylaxis   COVID-19 infection - Incidental. Asymptomatic. On room air.  Treated with 3 days of remdesivir  Iron deficiency anemia -Likely related to colonic mass, chronic inflammation in the setting of infectious process, requesting his PRBC transfusion on admission . -Choose another 2 units PRBC 2/9 with good response, continue to monitor CBC closely.  Hyponatremia Mild, Asymptomatic  Tobacco abuse Smoking cessation discussed. -Continue nicotine    DVT prophylaxis: [place] heparin Code Status:   Code Status: Full Code Family Communication: None at bedside, I have discussed with patient at length and answered all his questions. Disposition Plan: Discharge likely in several days pending general surgery recommendations for management.   Consultants:   General surgery  Interventional radiology  oncology  Procedures:  percutaneous drain per IR placed on 2/7, TPN IR biopsy of hepatic lesion 2/9  Antimicrobials:  Zosyn IV      Objective: Vitals:   11/22/20 0400 11/22/20 1156 11/22/20 1951 11/23/20 0506  BP: 125/83 126/83 125/81 116/75  Pulse: 83 96 94 88  Resp: 17 19 17 15   Temp: 98.4 F (36.9 C) 99 F (37.2 C) 98.5 F (36.9 C) 98.1 F (36.7 C)  TempSrc: Oral Oral Oral Oral  SpO2: 96% 96% 96% 96%  Weight:      Height:        Intake/Output Summary (Last 24 hours) at 11/23/2020 1417 Last data filed at 11/23/2020 1403 Gross per 24 hour  Intake 2671.55 ml  Output 2765 ml  Net -93.45 ml   Filed Weights   11/15/20 2000  Weight: 69.4 kg  Examination:  Awake Alert, Oriented X 3, No new F.N deficits, Normal affect Symmetrical Chest wall movement, Good air movement bilaterally, CTAB RRR,No Gallops,Rubs or new Murmurs, No Parasternal Heave +ve B.Sounds, Abd Soft, drain with purulent discharge,No rebound - guarding or rigidity. No Cyanosis, Clubbing or  edema, No new Rash or bruise    Data Reviewed: I have personally reviewed following labs and imaging studies  CBC Lab Results  Component Value Date   WBC 9.8 11/23/2020   RBC 3.95 (L) 11/23/2020   HGB 8.9 (L) 11/23/2020   HCT 30.1 (L) 11/23/2020   MCV 76.2 (L) 11/23/2020   MCH 22.5 (L) 11/23/2020   PLT 855 (H) 11/23/2020   MCHC 29.6 (L) 11/23/2020   RDW 30.3 (H) 11/23/2020   LYMPHSABS 1.4 11/20/2020   MONOABS 0.7 11/20/2020   EOSABS 0.1 11/20/2020   BASOSABS 0.1 46/50/3546     Last metabolic panel Lab Results  Component Value Date   NA 132 (L) 11/23/2020   K 4.4 11/23/2020   CL 100 11/23/2020   CO2 24 11/23/2020   BUN 15 11/23/2020   CREATININE 0.67 11/23/2020   GLUCOSE 124 (H) 11/23/2020   GFRNONAA >60 11/23/2020   CALCIUM 8.6 (L) 11/23/2020   PHOS 4.2 11/22/2020   PROT 6.3 (L) 11/23/2020   ALBUMIN 1.9 (L) 11/23/2020   BILITOT 0.4 11/23/2020   ALKPHOS 109 11/23/2020   AST 37 11/23/2020   ALT 43 11/23/2020   ANIONGAP 8 11/23/2020    CBG (last 3)  Recent Labs    11/23/20 0405 11/23/20 0747 11/23/20 1236  GLUCAP 124* 109* 102*     GFR: Estimated Creatinine Clearance: 107.2 mL/min (by C-G formula based on SCr of 0.67 mg/dL).  Coagulation Profile: No results for input(s): INR, PROTIME in the last 168 hours.  Recent Results (from the past 240 hour(s))  SARS Coronavirus 2 by RT PCR (hospital order, performed in [facility] hospital lab) Nasopharyngeal Nasopharyngeal Swab     Status: Abnormal   Collection Time: 11/15/20 12:41 PM   Specimen: Nasopharyngeal Swab  Result Value Ref Range Status   SARS Coronavirus 2 POSITIVE (A) NEGATIVE Final    Comment: RESULT CALLED TO, READ BACK BY AND VERIFIED WITH: E SHAFFER RN 1352 11/15/20 A BROWNING (NOTE) SARS-CoV-2 target nucleic acids are DETECTED  SARS-CoV-2 RNA is generally detectable in upper respiratory specimens  during the acute phase of infection.  Positive results are indicative  of the presence of the  identified virus, but do not rule out bacterial infection or co-infection with other pathogens not detected by the test.  Clinical correlation with patient history and  other diagnostic information is necessary to determine patient infection status.  The expected result is negative.  Fact Sheet for Patients:   StrictlyIdeas.no   Fact Sheet for Healthcare Providers:   BankingDealers.co.za    This test is not yet approved or cleared by the [place] FDA and  has been authorized for detection and/or diagnosis of SARS-CoV-2 by FDA under an Emergency Use Authorization (EUA).  This EUA will remain in effect (meaning this t est can be used) for the duration of  the COVID-19 declaration under Section 564(b)(1) of the Act, 21 U.S.C. section 360-bbb-3(b)(1), unless the authorization is terminated or revoked sooner.  Performed at [facility], [place] [address] [place], [place] [address]   Blood Culture (routine x 2)     Status: None   Collection Time: 11/15/20  1:52 PM   Specimen: BLOOD RIGHT FOREARM  Result Value Ref Range Status   Specimen Description BLOOD RIGHT FOREARM  Final   Special Requests   Final    BOTTLES DRAWN AEROBIC AND ANAEROBIC Blood Culture adequate volume   Culture   Final    NO GROWTH 5 DAYS Performed at [facility], [address], [place], [place] [address]    Report Status 11/20/2020 FINAL  Final  Blood Culture (routine x 2)     Status: None   Collection Time: 11/15/20  2:13 PM   Specimen: BLOOD LEFT FOREARM  Result Value Ref Range Status   Specimen Description BLOOD LEFT FOREARM  Final   Special Requests   Final    BOTTLES DRAWN AEROBIC AND ANAEROBIC Blood Culture adequate volume   Culture   Final    NO GROWTH 5 DAYS Performed at [facility], [place] [address] [place], [place] [address]    Report Status 11/20/2020 FINAL  Final  Aerobic/Anaerobic Culture (surgical/deep wound)     Status: None    Collection Time: 11/18/20  4:34 PM   Specimen: Abscess  Result Value Ref Range Status   Specimen Description ABSCESS  Final   Special Requests ABDOMEN  Final   Gram Stain   Final    FEW WBC PRESENT, PREDOMINANTLY PMN MODERATE GRAM NEGATIVE RODS FEW GRAM POSITIVE COCCI IN PAIRS IN CHAINS FEW GRAM POSITIVE RODS    Culture   Final    FEW ENTEROBACTER AEROGENES MODERATE STREPTOCOCCUS GROUP F Beta hemolytic streptococci are predictably susceptible to penicillin and other beta lactams. Susceptibility testing not routinely performed. ABUNDANT BACTEROIDES FRAGILIS BETA LACTAMASE POSITIVE Performed at [facility], [place] [address] [place], [place] [address]    Report Status 11/21/2020 FINAL  Final   Organism ID, Bacteria ENTEROBACTER AEROGENES  Final      Susceptibility   Enterobacter aerogenes - MIC*    CEFAZOLIN >=64 RESISTANT Resistant     CEFEPIME <=0.12 SENSITIVE Sensitive     CEFTAZIDIME <=1 SENSITIVE Sensitive     CEFTRIAXONE <=0.25 SENSITIVE Sensitive     CIPROFLOXACIN <=0.25 SENSITIVE Sensitive     GENTAMICIN <=1 SENSITIVE Sensitive     IMIPENEM 0.5 SENSITIVE Sensitive     TRIMETH/SULFA <=20 SENSITIVE Sensitive     PIP/TAZO <=4 SENSITIVE Sensitive     * FEW ENTEROBACTER AEROGENES  Aerobic/Anaerobic Culture (surgical/deep wound)     Status: None (Preliminary result)   Collection Time: 11/20/20  4:22 PM   Specimen: Biopsy; Tissue  Result Value Ref Range Status   Specimen Description BIOPSY LIVER  Final   Special Requests Normal  Final   Gram Stain   Final    RARE WBC PRESENT, PREDOMINANTLY PMN NO ORGANISMS SEEN    Culture   Final    NO GROWTH 2 DAYS Performed at [facility], [place] [address], [place], [place] [address]    Report Status PENDING  Incomplete        Radiology Studies: No results found.    Scheduled Meds: . Chlorhexidine Gluconate Cloth  6 each Topical Daily  . feeding supplement  237 mL Oral QID  . ferrous sulfate  325 mg Oral BID  WC  . heparin injection (subcutaneous)  5,000 Units Subcutaneous Q8H  . influenza vac split quadrivalent PF  0.5 mL Intramuscular Tomorrow-1000  . insulin aspart  0-15 Units Subcutaneous Q4H  . lactobacillus  1 g Oral TID WC  . lip balm  1 application Topical BID  . nicotine  21  mg Transdermal Daily  . polycarbophil  625 mg Oral BID  . saccharomyces boulardii  250 mg Oral BID  . senna-docusate  1 tablet Oral BID  . sodium chloride flush  10-40 mL Intracatheter Q12H  . sodium chloride flush  5 mL Intracatheter Q8H   Continuous Infusions: . sodium chloride 10 mL/hr at 11/21/20 0642  . methocarbamol (ROBAXIN) IV    . piperacillin-tazobactam (ZOSYN)  IV 3.375 g (11/23/20 0547)  . TPN ADULT (ION) 50 mL/hr at 11/22/20 1807  . TPN ADULT (ION)       LOS: 8 days     Cordelia Poche, MD [facility] 11/23/2020, 2:17 PM  If 7PM-7AM, please contact night-coverage [email or web address]

## 2020-11-23 NOTE — Plan of Care (Signed)
VSS. Remains on room air. OOB ambulating to BR. TPN infusing. JP intact. C/o pain, given tylenol. Call bell within reach.   Problem: Education: Goal: Knowledge of risk factors and measures for prevention of condition will improve Outcome: Progressing   Problem: Coping: Goal: Psychosocial and spiritual needs will be supported Outcome: Progressing   Problem: Respiratory: Goal: Will maintain a patent airway Outcome: Progressing Goal: Complications related to the disease process, condition or treatment will be avoided or minimized Outcome: Progressing

## 2020-11-23 NOTE — Progress Notes (Addendum)
Talked with sister Eric [name].  Discussed stage 4 cancer diagnosis as not curable.  Surgery and chemotherapy will be part of the plan in the future.  These things are meant to improve his quality of life and give him more time.  Eric [name] asked me about prognosis.  I deferred to Oncology (Dr. Benay Spice) but told her with no treatment he likely had weeks to months given how sick he was currently.    I explained the abscess / infection would need to be cleared up before surgery or chemo could be initiated.  She asked the status of the infection.  I let her know his WBC is improving but he still had purulent drainage coming from the abscess.    We discussed how long he would be on IV.  If he is eating and drinking well the IV will likely soon be discontinued.  He has been on IV antibiotics since 2/4.  She tried to discern when he would be released from the hospital, but I deferred to his attending and stated it would like be at least 2 or more days pending surgical recommendations.  Eric [name] expressed appreciation for my call and explains that sometimes Eric [name] gets confused with everything the doctors tell him so it's helpful if we keep her updated as well.  Assessment:  Patient clinically improving.  Happy to be restarting diet. Plan:  Continue current care.  PMT will continue to follow with you supporting patient and family.  Florentina Jenny, PA-C Palliative Medicine Office:  [phone]  15 min. Greater than 50%  of this time was spent counseling and coordinating care related to the above assessment and plan.

## 2020-11-23 NOTE — Progress Notes (Signed)
PHARMACY - TOTAL PARENTERAL NUTRITION CONSULT NOTE   Indication: Perforated right colon into retroperitoneum  Patient Measurements: Height: 6' (182.9 cm) Weight: 69.4 kg (153 lb) IBW/kg (Calculated) : 77.6 TPN AdjBW (KG): 69.4 Body mass index is 20.75 kg/m. Usual Weight: 170 lbs  Assessment: Eric [name] is a 50 y.o. male no known medical history. Patient presented secondary to right groin pain and found to have a retroperitoneal abscess/bowel perforation/cecal mass concerning for neoplasm. Incidental finding of COVID-19 positive status. Surgery following, no surgical interventions at this time. Plans for short term management with drains and IV abx. Plan for surgical resection of R colon/retroperitoneal mass ideally in 6 weeks for more optimal surgical conditions.    Glucose / Insulin: No history of diabetes. No A1c available. CBGs 124 - 157. Utilized 13 units / 24 hrs Electrolytes: Na 132. Corrected Ca 10.28. Other electrolytes wnl.  Renal: Scr 0.67 - stable. BUN wnl.  LFTs / TGs: AST/ALT and Tbili wnl. TG 91 (2/4).  Prealbumin / albumin: Prealbumin 5.8. Albumin 1.9.  Intake / Output; MIVF: 1.8 ml/kg/hr. NS dec to [facility] on 2/8 ~1400. R JP drain 100 ml/24 hrs.  GI Imaging:  2/4 CT abd: long segment masslike thickening on cecum concerning for neoplasm, R retroperitoneal abscess 2/9 MRI abd: lesion in peripheral R liver - possible intrahepatic abscess vs. Metastatic disease. Dec fluid in R iliopsoas.  Surgeries / Procedures:  2/7: R retroperitoneal drain placed Central access: 11/18/20 TPN start date: 11/18/20  Nutritional Goals (per RD recommendation on 2/5): Kcal:  2200-2430; Protein:  110-125; Fluid:  >/= 2.2 L Goal TPN rate is 100 mL/hr (provides 120 g of protein and an average of 2328 kcals per day - with 21 g/L lipids on MWF)  Current Nutrition:  Full liquid diet 2/10 Ensure QID - consumes 100% per pt  TPN  Plan:  Continue TPN at 1/2 rate of 50 mL/hr per discussion with  Surgery. This will provide1056 kcal and 60g of protein Hold lipids for 7-10 days  Electrolytes in TPN: Remove Ca. Increase Na 120 mEq/L. Continue 30 mEq/L of K, 3 mEq/L of Mg, 15 mmol/L of Phos. Continue Cl:Ac 2:1.  Add standard MVI and trace elements to TPN Continue Moderate q4h SSI and adjust as needed Monitor TPN labs on Mon/Thurs, repeat electrolytes tomorrow  Follow up toleration of advancement of diet and plans for weaning TPN  Cristela Felt, PharmD Clinical Pharmacist  11/23/2020, 7:02 AM

## 2020-11-23 NOTE — Progress Notes (Signed)
CC: Right lower quadrant hip pain  Subjective: Patient had a bowel movement yesterday reasonable volume.  Smaller one this a.m.  He is tolerating a full liquids.  He continues to drain this purulent looking fluid from his IR drain.  100 cc recorded yesterday.  No pain in his right abdomen or thigh.  Objective: Vital signs in last 24 hours: Temp:  [98.1 F (36.7 C)-99 F (37.2 C)] 98.1 F (36.7 C) (02/12 0506) Pulse Rate:  [88-96] 88 (02/12 0506) Resp:  [15-19] 15 (02/12 0506) BP: (116-126)/(75-83) 116/75 (02/12 0506) SpO2:  [96 %] 96 % (02/12 0506) Last BM Date: 11/22/20 480 p.o. recorded 2300 IV Urine 2925 Drain 100 No BM recorded Afebrile vital signs are stable NA 132, potassium 4.4, glucose 124, WBC 9.8 H/H 8.9/30.1 Platelets 855,000  Intake/Output from previous day: 02/11 0701 - 02/12 0700 In: 2786.6 [P.O.:480; I.V.:2046.5; IV Piggyback:150] Out: 3025 [Urine:2925; Drains:100] Intake/Output this shift: Total I/O In: 240 [P.O.:240] Out: 325 [Urine:325]  General appearance: alert, cooperative and no distress Resp: clear to auscultation bilaterally GI: Soft nontender, positive bowel sounds positive BM, tolerating full liquids.  Drain shows ongoing purulent cloudy fluid from this abscess.  Lab Results:  Recent Labs    11/22/20 1230 11/23/20 0306  WBC 17.9* 9.8  HGB 8.9* 8.9*  HCT 30.5* 30.1*  PLT 922* 855*    BMET Recent Labs    11/22/20 0345 11/23/20 0306  NA 131* 132*  K 4.5 4.4  CL 101 100  CO2 22 24  GLUCOSE 134* 124*  BUN 16 15  CREATININE 0.57* 0.67  CALCIUM 8.3* 8.6*   PT/INR No results for input(s): LABPROT, INR in the last 72 hours.  Recent Labs  Lab 11/18/20 0408 11/19/20 0500 11/20/20 0406 11/21/20 0345 11/23/20 0306  AST 14* 24 36 35 37  ALT 14 17 27  36 43  ALKPHOS 71 76 80 74 109  BILITOT 0.4 0.5 0.4 0.5 0.4  PROT 5.8* 5.5* 6.0* 6.0* 6.3*  ALBUMIN 1.5* 1.5* 1.7* 1.7* 1.9*     Lipase  No results found for: LIPASE    Medications: . Chlorhexidine Gluconate Cloth  6 each Topical Daily  . feeding supplement  237 mL Oral QID  . ferrous sulfate  325 mg Oral BID WC  . heparin injection (subcutaneous)  5,000 Units Subcutaneous Q8H  . influenza vac split quadrivalent PF  0.5 mL Intramuscular Tomorrow-1000  . insulin aspart  0-15 Units Subcutaneous Q4H  . lip balm  1 application Topical BID  . nicotine  21 mg Transdermal Daily  . polycarbophil  625 mg Oral BID  . saccharomyces boulardii  250 mg Oral BID  . sodium chloride flush  10-40 mL Intracatheter Q12H  . sodium chloride flush  5 mL Intracatheter Q8H    Assessment/Plan IDA Hyponatremia Tobacco abuse COVID-19positive  - CRP 21.4>>18.9>>20.2>>19.9>>12.9 -Platelets1165K>>954K>>895K>>818K>>860K>>869 .Severe malnutrition - prealbumin 5.8 (2/8), continue TPN  Colonmass with perforationand abscess Stage IV adenocarcinoma colon - concerning for cancer - at some point he is going to require surgical resection, but ideally this can be delayed; if he does not improve he would need surgery this admission which would likely include colon resection and ileostomy - WBC 18.6>>21.3>>20.8>>15.7>>13.7>>13.1>>17.9>>9.8 Retroperitoneal abscess - s/p IR drain placement 2/7 - cx Enterobacter aerogenes - continue drain and IV zosyn Liver lesions - x3 noted on MRI, unsure if abscess vs metastatic disease.IR liver biopsy 11/20/2020;   - A. LIVER, RIGHT MASS, NEEDLE CORE BIOPSY:  - Adenocarcinoma.  -  See comment.  COMMENT:  The morphology suggests metastatic colorectal adenocarcinoma.  Immunohistochemistry will be performed and reported as an addendum.  There is sufficient tissue for additional testing.  Oncology recommendations: 1. continue JP drain/antibiotics; 2.  Surgical resection primary mass by general surgery when stable; 3.  Outpatient genetic testing/no chemotherapy at this point; 4.  GI tumor board next week  ID -zosyn 2/4>>day 7 FEN -TPN@  45 ml/hr/full liquids VTE -sq heparin Foley -none   Plan: Advance his diet, place him on a gentle stool softener/laxative, add probiotics.  If he does well with this we should be able to wean his TPN off tomorrow and begin discussing discharge plans.  I imagine he will have to go home on antibiotics because of his ongoing purulent drainage.  We can then begin arranging follow-ups as noted above by Dr. Benay Spice after the infection has resolved.  LOS: 8 days    Eric [name] 11/23/2020 Please see Amion

## 2020-11-24 DIAGNOSIS — E440 Moderate protein-calorie malnutrition: Secondary | ICD-10-CM

## 2020-11-24 LAB — CBC
HCT: 29.5 % — ABNORMAL LOW (ref 39.0–52.0)
Hemoglobin: 9.1 g/dL — ABNORMAL LOW (ref 13.0–17.0)
MCH: 23.2 pg — ABNORMAL LOW (ref 26.0–34.0)
MCHC: 30.8 g/dL (ref 30.0–36.0)
MCV: 75.3 fL — ABNORMAL LOW (ref 80.0–100.0)
Platelets: 898 10*3/uL — ABNORMAL HIGH (ref 150–400)
RBC: 3.92 MIL/uL — ABNORMAL LOW (ref 4.22–5.81)
RDW: 31.5 % — ABNORMAL HIGH (ref 11.5–15.5)
WBC: 13.4 10*3/uL — ABNORMAL HIGH (ref 4.0–10.5)
nRBC: 0 % (ref 0.0–0.2)

## 2020-11-24 LAB — COMPREHENSIVE METABOLIC PANEL
ALT: 51 U/L — ABNORMAL HIGH (ref 0–44)
AST: 45 U/L — ABNORMAL HIGH (ref 15–41)
Albumin: 2 g/dL — ABNORMAL LOW (ref 3.5–5.0)
Alkaline Phosphatase: 106 U/L (ref 38–126)
Anion gap: 9 (ref 5–15)
BUN: 20 mg/dL (ref 6–20)
CO2: 23 mmol/L (ref 22–32)
Calcium: 8.5 mg/dL — ABNORMAL LOW (ref 8.9–10.3)
Chloride: 99 mmol/L (ref 98–111)
Creatinine, Ser: 0.63 mg/dL (ref 0.61–1.24)
GFR, Estimated: >60 mL/min (ref >60)
Glucose, Bld: 142 mg/dL — ABNORMAL HIGH (ref 70–99)
Potassium: 4.5 mmol/L (ref 3.5–5.1)
Sodium: 131 mmol/L — ABNORMAL LOW (ref 135–145)
Total Bilirubin: 0.1 mg/dL — ABNORMAL LOW (ref 0.3–1.2)
Total Protein: 6.4 g/dL — ABNORMAL LOW (ref 6.5–8.1)

## 2020-11-24 LAB — PHOSPHORUS: Phosphorus: 4.8 mg/dL — ABNORMAL HIGH (ref 2.5–4.6)

## 2020-11-24 LAB — GLUCOSE, CAPILLARY
Glucose-Capillary: 131 mg/dL — ABNORMAL HIGH (ref 70–99)
Glucose-Capillary: 126 mg/dL — ABNORMAL HIGH (ref 70–99)
Glucose-Capillary: 117 mg/dL — ABNORMAL HIGH (ref 70–99)
Glucose-Capillary: 109 mg/dL — ABNORMAL HIGH (ref 70–99)

## 2020-11-24 LAB — MAGNESIUM: Magnesium: 2.2 mg/dL (ref 1.7–2.4)

## 2020-11-24 NOTE — Progress Notes (Signed)
Subjective: He did not eat breakfast this morning he thinks he ate too much yesterday and is complaining of pain in his right lower quadrant today.  He did have 2 bowel movements recorded yesterday.  No nausea or vomiting.  The JP drainage today looked a little bit clearer than it has been.  Pharmacy called at 8 AM to ask about stopping his TPN which we did empirically.  Objective: Vital signs in last 24 hours: Temp:  [97.8 F (36.6 C)-99 F (37.2 C)] 97.8 F (36.6 C) (02/13 1241) Pulse Rate:  [82-98] 86 (02/13 1241) Resp:  [16-25] 16 (02/13 1241) BP: (117-130)/(77-87) 130/87 (02/13 1241) SpO2:  [95 %-97 %] 97 % (02/13 1241) Last BM Date: 11/23/20 720 PO 1000 IV Drain 83 2700 urine Stool x 2 Afebrile, VSS LFT's up slightly WBC is up today  Intake/Output from previous day: 02/12 0701 - 02/13 0700 In: 1682.3 [P.O.:720; I.V.:783.1; IV Piggyback:164.2] Out: 2783 [Urine:2700; Drains:83] Intake/Output this shift: Total I/O In: 10 [I.V.:10] Out: 1000 [Urine:1000]  General appearance: alert, cooperative and no distress Resp: clear to auscultation bilaterally GI: Soft, tender in the right lower quadrant today.  This is new he has not had any tenderness for the past 4 days, as we have advanced his diet.  He did have 2 bowel movements yesterday.  The JP drain was less purulent and clearer today than it has been.  Lab Results:  Recent Labs    11/23/20 0306 11/24/20 0311  WBC 9.8 13.4*  HGB 8.9* 9.1*  HCT 30.1* 29.5*  PLT 855* 898*    BMET Recent Labs    11/23/20 0306 11/24/20 0311  NA 132* 131*  K 4.4 4.5  CL 100 99  CO2 24 23  GLUCOSE 124* 142*  BUN 15 20  CREATININE 0.67 0.63  CALCIUM 8.6* 8.5*   PT/INR No results for input(s): LABPROT, INR in the last 72 hours.  Recent Labs  Lab 11/19/20 0500 11/20/20 0406 11/21/20 0345 11/23/20 0306 11/24/20 0311  AST 24 36 35 37 45*  ALT 17 27 36 43 51*  ALKPHOS 76 80 74 109 106  BILITOT 0.5 0.4 0.5 0.4  0.1*  PROT 5.5* 6.0* 6.0* 6.3* 6.4*  ALBUMIN 1.5* 1.7* 1.7* 1.9* 2.0*     Lipase  No results found for: LIPASE   Medications: . Chlorhexidine Gluconate Cloth  6 each Topical Daily  . feeding supplement  237 mL Oral QID  . ferrous sulfate  325 mg Oral BID WC  . heparin injection (subcutaneous)  5,000 Units Subcutaneous Q8H  . influenza vac split quadrivalent PF  0.5 mL Intramuscular Tomorrow-1000  . insulin aspart  0-15 Units Subcutaneous TID WC  . insulin aspart  0-5 Units Subcutaneous QHS  . lactobacillus  1 g Oral TID WC  . lip balm  1 application Topical BID  . nicotine  21 mg Transdermal Daily  . polycarbophil  625 mg Oral BID  . saccharomyces boulardii  250 mg Oral BID  . senna-docusate  1 tablet Oral BID  . sodium chloride flush  10-40 mL Intracatheter Q12H  . sodium chloride flush  5 mL Intracatheter Q8H    Assessment/Plan Anemia Hyponatremia Tobacco abuse COVID-19positive  - CRP 21.4>>18.9>>20.2>>19.9>>12.9 -Platelets1165K>>954K>>895K>>818K>>860K>>869 .Severe malnutrition - prealbumin 5.8 (2/8), continue TPN  Colonmass with perforationand abscess Stage IV adenocarcinoma colon - concerning for cancer - at some point he is going to require surgical resection, but ideally this can be delayed; if he does not improve  he would need surgery this admission which would likely include colon resection and ileostomy - WBC 18.6>>21.3>>20.8>>15.7>>13.7>>13.1>>17.9>>9.8>>13.4 Retroperitoneal abscess - s/p IR drain placement 2/7 - cx Enterobacter aerogenes - continue drain and IV zosyn Liver lesions - x3 noted on MRI, unsure if abscess vs metastatic disease.IR liver biopsy 11/20/2020; -A. LIVER, RIGHT MASS, NEEDLE CORE BIOPSY:  - Adenocarcinoma.  - See comment.  COMMENT:  The morphology suggests metastatic colorectal adenocarcinoma.  Immunohistochemistry will be performed and reported as an addendum.  There is sufficient tissue for additional testing.  Oncology  recommendations: 1. continue JP drain/antibiotics; 2.  Surgical resection primary mass by general surgery when stable; 3.  Outpatient genetic testing/no chemotherapy at this point; 4.  GI tumor board next week  ID -zosyn 2/4>>day  FEN - soft diet>> full liquids; stopping TPN VTE:  heparin Foley -none   Plan: We are going to recheck a CT on him tomorrow.  I put him back to full liquids.  Continue IV antibiotics.  Recheck labs/prealbumin tomorrow.  We will also stop the sennakot.            LOS: 9 days    Boston Cookson 11/24/2020 Please see Amion

## 2020-11-24 NOTE — Progress Notes (Signed)
PHARMACY - TOTAL PARENTERAL NUTRITION CONSULT NOTE   Indication: Perforated right colon into retroperitoneum  Patient Measurements: Height: 6' (182.9 cm) Weight: 69.4 kg (153 lb) IBW/kg (Calculated) : 77.6 TPN AdjBW (KG): 69.4 Body mass index is 20.75 kg/m. Usual Weight: 170 lbs  Assessment: Eric [name] is a 50 y.o. male no known medical history. Patient presented secondary to right groin pain and found to have a retroperitoneal abscess/bowel perforation/cecal mass concerning for neoplasm. Incidental finding of COVID-19 positive status. Surgery following, no surgical interventions at this time. Plans for short term management with drains and IV abx. Plan for surgical resection of R colon/retroperitoneal mass ideally in 6 weeks for more optimal surgical conditions.    Glucose / Insulin: No history of diabetes. No A1c available. CBGs 102 - 149. Utilized 0 units / 24 hrs - dc'ed q4h and changed to with meals by MD.  Electrolytes: Na 131. Phos 4.8. Corrected Ca 10.1. Other electrolytes wnl.  Renal: Scr 0.63 - stable. BUN wnl.  LFTs / TGs: AST/ALT 45/51. T bili 0.1. TG 91 (2/4).  Prealbumin / albumin: Prealbumin 5.8. Albumin 2.0.  Intake / Output; MIVF: 1.6 ml/kg/hr. NS dec to [facility] on 2/8 ~1400. R JP drain 83 ml/24 hrs. LBM 2/12 GI Imaging:  2/4 CT abd: long segment masslike thickening on cecum concerning for neoplasm, R retroperitoneal abscess 2/9 MRI abd: lesion in peripheral R liver - possible intrahepatic abscess vs. Metastatic disease. Dec fluid in R iliopsoas.  Surgeries / Procedures:  2/7: R retroperitoneal drain placed Central access: 11/18/20 TPN start date: 11/18/20  Nutritional Goals (per RD recommendation on 2/5): Kcal:  2200-2430; Protein:  110-125; Fluid:  >/= 2.2 L Goal TPN rate is 100 mL/hr (provides 120 g of protein and an average of 2328 kcals per day - with 21 g/L lipids on MWF)  Current Nutrition:  Full liquid diet 2/10 > soft diet 2/12 - pt reports eating 100% of food  provided except for rice with dinner last night. Ensure QID - consumes 100% per pt; 3 yesterday TPN  Plan:  No new TPN per discussion with Dr. Nelida Gores, PharmD Clinical Pharmacist  11/24/2020, 7:05 AM

## 2020-11-24 NOTE — Progress Notes (Signed)
PROGRESS NOTE    Eric [name]  YOV:[id number] DOB: 07-Jan-1971 DOA: 11/15/2020 PCP: [facility]   Brief Narrative:  Eric [name] is a 50 y.o. male no known medical history. Patient presented secondary to right groin pain and found to have a retroperitoneal abscess/bowel perforation/cecal mass concerning for neoplasm. Incidental finding of COVID-19 positive status. General surgery consulted   Subjective:  Reports bowel movement yesterday, reports worsening abdominal pain today, but no nausea, no vomiting, unable to tolerate soft diet this morning.    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 -On TPN, has been weaned today, will hold on resuming pending CT abdomen pelvis tomorrow. -Liver lesion biopsy 2/9, significant for colorectal adenocarcinoma, oncology consulted.  They will follow as an outpatient, treatment will be done as an outpatient, after surgical issues and malnutrition has resolved. -Patient advance to soft diet per general surgery, will need surgical  resection of mass prior to chemotherapy, likely will be done at a later date as an outpatient till nutritional status and infection improves   Protein calorie malnutrition -On TPN, stopped today, will reassess need of TPN pending CT tomorrow and oral intake  Thrombocytosis -Due to infection/inflammation, reactive. -DVT prophylaxis   COVID-19 infection - Incidental. Asymptomatic. On room air.  Treated with 3 days of remdesivir  Iron deficiency anemia -Likely related to colonic mass, chronic inflammation in the setting of infectious process, requesting his PRBC transfusion on admission . -Choose another 2 units PRBC 2/9 with good response, continue to monitor CBC closely.  Hyponatremia Mild, Asymptomatic  Tobacco abuse Smoking cessation discussed. -Continue nicotine    DVT prophylaxis: [place] heparin Code Status:   Code Status: Full Code Family Communication: None at bedside, I have discussed with patient at length and answered all his questions. Disposition Plan: Discharge likely in several days pending general surgery recommendations for management.   Consultants:   General surgery  Interventional radiology  oncology  Procedures:  percutaneous drain per IR placed on 2/7, TPN IR biopsy of hepatic lesion 2/9  Antimicrobials:  Zosyn IV      Objective: Vitals:   11/23/20 2018 11/24/20 0500 11/24/20 0543 11/24/20 1241  BP: 117/81 124/81  130/87  Pulse: 92 98 82 86  Resp: (!) 25 18 18 16   Temp: 98 F (36.7 C) 97.9 F (36.6 C)  97.8 F (36.6 C)  TempSrc: Oral Oral  Oral  SpO2: 96% 96% 97% 97%  Weight:      Height:        Intake/Output Summary (Last 24 hours) at 11/24/2020 1509 Last data  filed at 11/24/2020 1202 Gross per 24 hour  Intake 1327.26 ml  Output 2828 ml  Net -1500.74 ml   Filed Weights   11/15/20 2000  Weight: 69.4 kg    Examination:  Awake Alert, Oriented X 3, No new F.N deficits, Normal affect Symmetrical Chest wall movement, Good air  movement bilaterally, CTAB RRR,No Gallops,Rubs or new Murmurs, No Parasternal Heave +ve B.Sounds, Abd Soft, tenderness in right lower quadrant, JP drain less purulent. No Cyanosis, Clubbing or edema, No new Rash or bruise    Data Reviewed: I have personally reviewed following labs and imaging studies  CBC Lab Results  Component Value Date   WBC 13.4 (H) 11/24/2020   RBC 3.92 (L) 11/24/2020   HGB 9.1 (L) 11/24/2020   HCT 29.5 (L) 11/24/2020   MCV 75.3 (L) 11/24/2020   MCH 23.2 (L) 11/24/2020   PLT 898 (H) 11/24/2020   MCHC 30.8 11/24/2020   RDW 31.5 (H) 11/24/2020   LYMPHSABS 1.4 11/20/2020   MONOABS 0.7 11/20/2020   EOSABS 0.1 11/20/2020   BASOSABS 0.1 00/93/8182     Last metabolic panel Lab Results  Component Value Date   NA 131 (L) 11/24/2020   K 4.5 11/24/2020   CL 99 11/24/2020   CO2 23 11/24/2020   BUN 20 11/24/2020   CREATININE 0.63 11/24/2020   GLUCOSE 142 (H) 11/24/2020   GFRNONAA >60 11/24/2020   CALCIUM 8.5 (L) 11/24/2020   PHOS 4.8 (H) 11/24/2020   PROT 6.4 (L) 11/24/2020   ALBUMIN 2.0 (L) 11/24/2020   BILITOT 0.1 (L) 11/24/2020   ALKPHOS 106 11/24/2020   AST 45 (H) 11/24/2020   ALT 51 (H) 11/24/2020   ANIONGAP 9 11/24/2020    CBG (last 3)  Recent Labs    11/23/20 2122 11/24/20 0758 11/24/20 1240  GLUCAP 149* 131* 126*     GFR: Estimated Creatinine Clearance: 107.2 mL/min (by C-G formula based on SCr of 0.63 mg/dL).  Coagulation Profile: No results for input(s): INR, PROTIME in the last 168 hours.  Recent Results (from the past 240 hour(s))  SARS Coronavirus 2 by RT PCR (hospital order, performed in [facility] hospital lab) Nasopharyngeal Nasopharyngeal Swab     Status: Abnormal   Collection Time: 11/15/20 12:41 PM   Specimen: Nasopharyngeal Swab  Result Value Ref Range Status   SARS Coronavirus 2 POSITIVE (A) NEGATIVE Final    Comment: RESULT CALLED TO, READ BACK BY AND VERIFIED WITH: E SHAFFER RN 1352 11/15/20 A  BROWNING (NOTE) SARS-CoV-2 target nucleic acids are DETECTED  SARS-CoV-2 RNA is generally detectable in upper respiratory specimens  during the acute phase of infection.  Positive results are indicative  of the presence of the identified virus, but do not rule out bacterial infection or co-infection with other pathogens not detected by the test.  Clinical correlation with patient history and  other diagnostic information is necessary to determine patient infection status.  The expected result is negative.  Fact Sheet for Patients:   StrictlyIdeas.no   Fact Sheet for Healthcare Providers:   BankingDealers.co.za    This test is not yet approved or cleared by the [place] FDA and  has been authorized for detection and/or diagnosis of SARS-CoV-2 by FDA under an Emergency Use Authorization (EUA).  This EUA will remain in effect (meaning this t est can be used) for the duration of  the COVID-19 declaration under Section 564(b)(1) of the Act, 21 U.S.C. section 360-bbb-3(b)(1), unless the authorization is terminated or revoked sooner.  Performed  at [facility], [place] [address] [place], [place] [address]   Blood Culture (routine x 2)     Status: None   Collection Time: 11/15/20  1:52 PM   Specimen: BLOOD RIGHT FOREARM  Result Value Ref Range Status   Specimen Description BLOOD RIGHT FOREARM  Final   Special Requests   Final    BOTTLES DRAWN AEROBIC AND ANAEROBIC Blood Culture adequate volume   Culture   Final    NO GROWTH 5 DAYS Performed at [facility], [place] [address], [place], [place] [address]    Report Status 11/20/2020 FINAL  Final  Blood Culture (routine x 2)     Status: None   Collection Time: 11/15/20  2:13 PM   Specimen: BLOOD LEFT FOREARM  Result Value Ref Range Status   Specimen Description BLOOD LEFT FOREARM  Final   Special Requests   Final    BOTTLES DRAWN AEROBIC AND ANAEROBIC Blood Culture adequate  volume   Culture   Final    NO GROWTH 5 DAYS Performed at [facility], [place] [address], [place], [place] [address]    Report Status 11/20/2020 FINAL  Final  Aerobic/Anaerobic Culture (surgical/deep wound)     Status: None   Collection Time: 11/18/20  4:34 PM   Specimen: Abscess  Result Value Ref Range Status   Specimen Description ABSCESS  Final   Special Requests ABDOMEN  Final   Gram Stain   Final    FEW WBC PRESENT, PREDOMINANTLY PMN MODERATE GRAM NEGATIVE RODS FEW GRAM POSITIVE COCCI IN PAIRS IN CHAINS FEW GRAM POSITIVE RODS    Culture   Final    FEW ENTEROBACTER AEROGENES MODERATE STREPTOCOCCUS GROUP F Beta hemolytic streptococci are predictably susceptible to penicillin and other beta lactams. Susceptibility testing not routinely performed. ABUNDANT BACTEROIDES FRAGILIS BETA LACTAMASE POSITIVE Performed at [facility], [place] [address], [place], [place] [address]    Report Status 11/21/2020 FINAL  Final   Organism ID, Bacteria ENTEROBACTER AEROGENES  Final      Susceptibility   Enterobacter aerogenes - MIC*    CEFAZOLIN >=64 RESISTANT Resistant     CEFEPIME <=0.12 SENSITIVE Sensitive     CEFTAZIDIME <=1 SENSITIVE Sensitive     CEFTRIAXONE <=0.25 SENSITIVE Sensitive     CIPROFLOXACIN <=0.25 SENSITIVE Sensitive     GENTAMICIN <=1 SENSITIVE Sensitive     IMIPENEM 0.5 SENSITIVE Sensitive     TRIMETH/SULFA <=20 SENSITIVE Sensitive     PIP/TAZO <=4 SENSITIVE Sensitive     * FEW ENTEROBACTER AEROGENES  Aerobic/Anaerobic Culture (surgical/deep wound)     Status: None (Preliminary result)   Collection Time: 11/20/20  4:22 PM   Specimen: Biopsy; Tissue  Result Value Ref Range Status   Specimen Description BIOPSY LIVER  Final   Special Requests Normal  Final   Gram Stain   Final    RARE WBC PRESENT, PREDOMINANTLY PMN NO ORGANISMS SEEN    Culture   Final    NO GROWTH 4 DAYS NO ANAEROBES ISOLATED; CULTURE IN PROGRESS FOR 5 DAYS Performed at [facility], [place] [address], [place],  [address]    Report Status PENDING  Incomplete        Radiology Studies: No results found.    Scheduled Meds: . Chlorhexidine Gluconate Cloth  6 each Topical Daily  . feeding supplement  237 mL Oral QID  . ferrous sulfate  325 mg Oral BID WC  . heparin injection (subcutaneous)  5,000 Units Subcutaneous Q8H  . influenza vac split quadrivalent PF  0.5 mL Intramuscular Tomorrow-1000  . insulin aspart  0-15 Units Subcutaneous TID WC  . insulin aspart  0-5 Units Subcutaneous QHS  . lactobacillus  1 g Oral TID WC  . lip balm  1 application Topical BID  . nicotine  21 mg Transdermal Daily  . polycarbophil  625 mg Oral BID  . saccharomyces boulardii  250 mg Oral BID  . sodium chloride flush  10-40 mL Intracatheter Q12H  . sodium chloride flush  5 mL Intracatheter Q8H   Continuous Infusions: . sodium chloride Stopped (11/24/20 0547)  . methocarbamol (ROBAXIN) IV    . piperacillin-tazobactam (ZOSYN)  IV 3.375 g (11/24/20 1350)  . TPN ADULT (ION) 25 mL/hr at 11/24/20 1439     LOS: 9 days     Cordelia Poche, MD [facility] 11/24/2020, 3:09 PM  If 7PM-7AM, please contact night-coverage [email or web address]

## 2020-11-24 NOTE — Progress Notes (Signed)
                                                                                                                                                                                                         Daily Progress Note   Patient Name: Eric [name]       Date: 11/24/2020 DOB: 10/26/1970  Age: 49 y.o. MRN#: [id number] Attending Physician: Elgergawy, Dawood S, MD Primary Care Physician: [facility] Admit Date: 11/15/2020  Reason for Consultation/Follow-up: To discuss complex medical decision making related to patient's goals of care  Subjective: Met Eric [name] at bedside.  He is feeling fairly well and in good spirits.  We reviewed an Advanced Directive together.  I explained that this is very different from a DNR.  Eric [name] is currently full code and we are not changing that.  An AD addresses (1) and HCPOA - some one to make health care decisions for you if you can not, and (2) a living will - essentially would you want life support if you were in the last days or weeks of life.  Eric [name] indicated that he will likely choose Eric [name] (sister) as HCPOA, and he gave a quick "no" that he would not want life support or artificial feeding if he was in the last days to weeks of life.  He wants Eric [name] to come in and discuss this with him before he completes it and has it notarized.  Left message for Eric [name] that the tentative plan is to heal the abscess, then surgery, then chemo options.  Eric [name] may or may not come home for a short period prior to surgery.   Also left information about the Advanced Directive.   Assessment: Abscess draining pink fluid.  Patient comfortable.  Very thin.  Unable to eat today.  Eating yesterday unsettled his stomach.   Patient Profile/HPI:  49 y.o. 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  Also with incidental finding of COVID+ infection.   Length of Stay: 9   Vital Signs: BP 124/81 (BP Location: Left Arm)   Pulse 82   Temp 97.9 F (36.6 C) (Oral)   Resp 18   Ht 6' (1.829 m)   Wt 69.4 kg   SpO2 97%   BMI 20.75 kg/m  SpO2: SpO2: 97 % O2 Device: O2 Device: Room Air O2 Flow Rate: O2 Flow Rate (L/min): 2 L/min                                                                                                                                                                                                                  Daily Progress Note   Patient Name: Eric [name]       Date: 11/24/2020 DOB: 10/26/1970  Age: 49 y.o. MRN#: [id number] Attending Physician: Elgergawy, Dawood S, MD Primary Care Physician: [facility] Admit Date: 11/15/2020  Reason for Consultation/Follow-up: To discuss complex medical decision making related to patient's goals of care  Subjective: Met Eric [name] at bedside.  He is feeling fairly well and in good spirits.  We reviewed an Advanced Directive together.  I explained that this is very different from a DNR.  Eric [name] is currently full code and we are not changing that.  An AD addresses (1) and HCPOA - some one to make health care decisions for you if you can not, and (2) a living will - essentially would you want life support if you were in the last days or weeks of life.  Eric [name] indicated that he will likely choose Eric [name] (sister) as HCPOA, and he gave a quick "no" that he would not want life support or artificial feeding if he was in the last days to weeks of life.  He wants Eric [name] to come in and discuss this with him before he completes it and has it notarized.  Left message for Eric [name] that the tentative plan is to heal the abscess, then surgery, then chemo options.  Eric [name] may or may not come home for a short period prior to surgery.   Also left information about the Advanced Directive.   Assessment: Abscess draining pink fluid.  Patient comfortable.  Very thin.  Unable to eat today.  Eating yesterday unsettled his stomach.   Patient Profile/HPI:  49 y.o. 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  Also with incidental finding of COVID+ infection.   Length of Stay: 9   Vital Signs: BP 124/81 (BP Location: Left Arm)   Pulse 82   Temp 97.9 F (36.6 C) (Oral)   Resp 18   Ht 6' (1.829 m)   Wt 69.4 kg   SpO2 97%   BMI 20.75 kg/m  SpO2: SpO2: 97 % O2 Device: O2 Device: Room Air O2 Flow Rate: O2 Flow Rate (L/min): 2 L/min       

## 2020-11-25 ENCOUNTER — Inpatient Hospital Stay (HOSPITAL_COMMUNITY): Payer: Self-pay

## 2020-11-25 LAB — GLUCOSE, CAPILLARY
Glucose-Capillary: 105 mg/dL — ABNORMAL HIGH (ref 70–99)
Glucose-Capillary: 122 mg/dL — ABNORMAL HIGH (ref 70–99)
Glucose-Capillary: 109 mg/dL — ABNORMAL HIGH (ref 70–99)
Glucose-Capillary: 105 mg/dL — ABNORMAL HIGH (ref 70–99)

## 2020-11-25 LAB — COMPREHENSIVE METABOLIC PANEL
ALT: 43 U/L (ref 0–44)
AST: 34 U/L (ref 15–41)
Albumin: 2.2 g/dL — ABNORMAL LOW (ref 3.5–5.0)
Alkaline Phosphatase: 109 U/L (ref 38–126)
Anion gap: 9 (ref 5–15)
BUN: 20 mg/dL (ref 6–20)
CO2: 25 mmol/L (ref 22–32)
Calcium: 8.9 mg/dL (ref 8.9–10.3)
Chloride: 96 mmol/L — ABNORMAL LOW (ref 98–111)
Creatinine, Ser: 0.72 mg/dL (ref 0.61–1.24)
GFR, Estimated: >60 mL/min (ref >60)
Glucose, Bld: 110 mg/dL — ABNORMAL HIGH (ref 70–99)
Potassium: 4.5 mmol/L (ref 3.5–5.1)
Sodium: 130 mmol/L — ABNORMAL LOW (ref 135–145)
Total Bilirubin: 0.6 mg/dL (ref 0.3–1.2)
Total Protein: 6.7 g/dL (ref 6.5–8.1)

## 2020-11-25 LAB — CBC
HCT: 31.4 % — ABNORMAL LOW (ref 39.0–52.0)
Hemoglobin: 9.7 g/dL — ABNORMAL LOW (ref 13.0–17.0)
MCH: 23 pg — ABNORMAL LOW (ref 26.0–34.0)
MCHC: 30.9 g/dL (ref 30.0–36.0)
MCV: 74.6 fL — ABNORMAL LOW (ref 80.0–100.0)
Platelets: 920 10*3/uL (ref 150–400)
RBC: 4.21 MIL/uL — ABNORMAL LOW (ref 4.22–5.81)
WBC: 11.7 10*3/uL — ABNORMAL HIGH (ref 4.0–10.5)
nRBC: 0 % (ref 0.0–0.2)

## 2020-11-25 LAB — AEROBIC/ANAEROBIC CULTURE W GRAM STAIN (SURGICAL/DEEP WOUND)
Culture: NO GROWTH
Special Requests: NORMAL

## 2020-11-25 LAB — PHOSPHORUS: Phosphorus: 5.7 mg/dL — ABNORMAL HIGH (ref 2.5–4.6)

## 2020-11-25 LAB — PREALBUMIN: Prealbumin: 31.5 mg/dL (ref 18–38)

## 2020-11-25 LAB — MAGNESIUM: Magnesium: 2.4 mg/dL (ref 1.7–2.4)

## 2020-11-25 MED ORDER — IOHEXOL 300 MG/ML  SOLN
100.0000 mL | Freq: Once | INTRAMUSCULAR | Status: AC | PRN
  Administered 2020-11-25: 100 mL via INTRAVENOUS

## 2020-11-25 MED ORDER — POLYETHYLENE GLYCOL 3350 17 G PO PACK
17.0000 g | PACK | Freq: Two times a day (BID) | ORAL | Status: DC
  Administered 2020-11-25 – 2020-11-27 (×6): 17 g via ORAL
  Filled 2020-11-25 (×6): qty 1

## 2020-11-25 MED ORDER — BISACODYL 10 MG RE SUPP
10.0000 mg | Freq: Every day | RECTAL | Status: DC
  Administered 2020-11-25 – 2020-11-26 (×2): 10 mg via RECTAL
  Filled 2020-11-25 (×5): qty 1

## 2020-11-25 NOTE — Progress Notes (Signed)
PROGRESS NOTE    Eric [name]  HAL:[id number] DOB: [birth year] DOA: 11/15/2020 PCP: Practice, [facility]   Brief Narrative:  Eric [name] is a 50 y.o. male no known medical history. Patient presented secondary to right groin pain and found to have a retroperitoneal abscess/bowel perforation/cecal mass concerning for neoplasm. Incidental finding of COVID-19 positive status. General surgery consulted   Subjective:  He does report poor appetite, decreased oral intake over last 24 hours, last bowel movement was yesterday, reports small amount, reports he still passing gas, he does report some nausea and vomiting yesterday.    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 -Initially on TPN, has been weaned gradually, off TPN as of 2/13, now his oral intake has decreased, and he is back from soft to full liquid diet, to go back on it, will await further recommendations from general surgery . -Repeat  CT abdomen and pelvis pending for today . -Liver lesion biopsy 2/9, significant for  colorectal adenocarcinoma, oncology consulted.  They will follow as an outpatient, treatment will be done as an outpatient, after surgical issues and malnutrition has resolved. -Patient advance to soft diet per general surgery, will need surgical resection of mass prior to chemotherapy, likely will be done at a later date as an outpatient till nutritional status and infection improves   Protein calorie malnutrition -On TPN, stopped 2/13, will reassess need  TPN need  pending CT and oral intake  Thrombocytosis -Due to infection/inflammation, reactive. -DVT prophylaxis   COVID-19 infection - Incidental. Asymptomatic. On room air.  Treated with 3 days of remdesivir  Iron deficiency anemia -Likely related to colonic mass, chronic inflammation in the setting of infectious process, requesting his PRBC transfusion on admission . -Choose another 2 units PRBC 2/9 with good response, continue to monitor CBC closely.  Hyponatremia Mild, Asymptomatic  Tobacco abuse Smoking cessation discussed. -Continue nicotine    DVT prophylaxis: [place] heparin Code Status:   Code Status: Full Code Family Communication: None at bedside, I have discussed with patient at length and answered all his questions. Disposition Plan: Discharge likely in several days pending general surgery recommendations for management.   Consultants:   General surgery  Interventional radiology  oncology  Procedures:  percutaneous drain per IR placed on 2/7, TPN IR biopsy of hepatic lesion 2/9  Antimicrobials:  Zosyn IV      Objective: Vitals:   11/24/20 2026 11/24/20 2103 11/25/20 0520 11/25/20 0634  BP: (!) 132/97  (!) 146/118 (!) 134/94  Pulse: 85 81 91 85  Resp: 16 16 17  14  Temp: 98.7 F (37.1 C)  97.7 F (36.5 C)   TempSrc: Oral  Oral   SpO2: 94% 96% 94% 96%  Weight:      Height:        Intake/Output Summary (Last 24 hours) at 11/25/2020 1301 Last data filed at 11/25/2020 1058 Gross per 24 hour   Intake 892.93 ml  Output 2085 ml  Net -1192.07 ml   Filed Weights   11/15/20 2000  Weight: 69.4 kg    Examination:  Awake Alert, Oriented X 3, frail, chronically sick appearing, no new F.N deficits, Normal affect Symmetrical Chest wall movement, Good air movement bilaterally, CTAB RRR,No Gallops,Rubs or new Murmurs, No Parasternal Heave +ve B.Sounds, Abd Soft, right lower quadrant tenderness, JP drain with some sanguinous discharge, less purulent, No rebound - guarding or rigidity. No Cyanosis, Clubbing or edema, No new Rash or bruise     Data Reviewed: I have personally reviewed following labs and imaging studies  CBC Lab Results  Component Value Date   WBC 11.7 (H) 11/25/2020   RBC 4.21 (L) 11/25/2020   HGB 9.7 (L) 11/25/2020   HCT 31.4 (L) 11/25/2020   MCV 74.6 (L) 11/25/2020   MCH 23.0 (L) 11/25/2020   PLT 920 (HH) 11/25/2020   MCHC 30.9 11/25/2020   RDW Not Measured 11/25/2020   LYMPHSABS 1.4 11/20/2020   MONOABS 0.7 11/20/2020   EOSABS 0.1 11/20/2020   BASOSABS 0.1 41/32/4401     Last metabolic panel Lab Results  Component Value Date   NA 130 (L) 11/25/2020   K 4.5 11/25/2020   CL 96 (L) 11/25/2020   CO2 25 11/25/2020   BUN 20 11/25/2020   CREATININE 0.72 11/25/2020   GLUCOSE 110 (H) 11/25/2020   GFRNONAA >60 11/25/2020   CALCIUM 8.9 11/25/2020   PHOS 5.7 (H) 11/25/2020   PROT 6.7 11/25/2020   ALBUMIN 2.2 (L) 11/25/2020   BILITOT 0.6 11/25/2020   ALKPHOS 109 11/25/2020   AST 34 11/25/2020   ALT 43 11/25/2020   ANIONGAP 9 11/25/2020    CBG (last 3)  Recent Labs    11/24/20 2116 11/25/20 0741 11/25/20 1214  GLUCAP 109* 105* 122*     GFR: Estimated Creatinine Clearance: 107.2 mL/min (by C-G formula based on SCr of 0.72 mg/dL).  Coagulation Profile: No results for input(s): INR, PROTIME in the last 168 hours.  Recent Results (from the past 240 hour(s))  Blood Culture (routine x 2)     Status: None   Collection Time: 11/15/20  1:52 PM    Specimen: BLOOD RIGHT FOREARM  Result Value Ref Range Status   Specimen Description BLOOD RIGHT FOREARM  Final   Special Requests   Final    BOTTLES DRAWN AEROBIC AND ANAEROBIC Blood Culture adequate volume   Culture   Final    NO GROWTH 5 DAYS Performed at [facility], [address] [place], [place] [address]    Report Status 11/20/2020 FINAL  Final  Blood Culture (routine x 2)     Status: None   Collection Time: 11/15/20  2:13 PM   Specimen: BLOOD LEFT FOREARM  Result Value Ref Range Status   Specimen Description BLOOD LEFT FOREARM  Final   Special Requests   Final    BOTTLES DRAWN AEROBIC AND ANAEROBIC Blood Culture adequate volume   Culture   Final    NO GROWTH 5 DAYS Performed at [facility], [place] [address], [place],  [address]    Report Status  11/20/2020 FINAL  Final  Aerobic/Anaerobic Culture (surgical/deep wound)     Status: None   Collection Time: 11/18/20  4:34 PM   Specimen: Abscess  Result Value Ref Range Status   Specimen Description ABSCESS  Final   Special Requests ABDOMEN  Final   Gram Stain   Final    FEW WBC PRESENT, PREDOMINANTLY PMN MODERATE GRAM NEGATIVE RODS FEW GRAM POSITIVE COCCI IN PAIRS IN CHAINS FEW GRAM POSITIVE RODS    Culture   Final    FEW ENTEROBACTER AEROGENES MODERATE STREPTOCOCCUS GROUP F Beta hemolytic streptococci are predictably susceptible to penicillin and other beta lactams. Susceptibility testing not routinely performed. ABUNDANT BACTEROIDES FRAGILIS BETA LACTAMASE POSITIVE Performed at [facility], [place] [address], [place], [place] [address]    Report Status 11/21/2020 FINAL  Final   Organism ID, Bacteria ENTEROBACTER AEROGENES  Final      Susceptibility   Enterobacter aerogenes - MIC*    CEFAZOLIN >=64 RESISTANT Resistant     CEFEPIME <=0.12 SENSITIVE Sensitive     CEFTAZIDIME <=1 SENSITIVE Sensitive     CEFTRIAXONE <=0.25 SENSITIVE Sensitive     CIPROFLOXACIN <=0.25 SENSITIVE Sensitive      GENTAMICIN <=1 SENSITIVE Sensitive     IMIPENEM 0.5 SENSITIVE Sensitive     TRIMETH/SULFA <=20 SENSITIVE Sensitive     PIP/TAZO <=4 SENSITIVE Sensitive     * FEW ENTEROBACTER AEROGENES  Aerobic/Anaerobic Culture (surgical/deep wound)     Status: None (Preliminary result)   Collection Time: 11/20/20  4:22 PM   Specimen: Biopsy; Tissue  Result Value Ref Range Status   Specimen Description BIOPSY LIVER  Final   Special Requests Normal  Final   Gram Stain   Final    RARE WBC PRESENT, PREDOMINANTLY PMN NO ORGANISMS SEEN    Culture   Final    NO GROWTH 4 DAYS NO ANAEROBES ISOLATED; CULTURE IN PROGRESS FOR 5 DAYS Performed at [facility], [place] [address], [place], [place] [address]    Report Status PENDING  Incomplete        Radiology Studies: No results found.    Scheduled Meds: . Chlorhexidine Gluconate Cloth  6 each Topical Daily  . feeding supplement  237 mL Oral QID  . ferrous sulfate  325 mg Oral BID WC  . heparin injection (subcutaneous)  5,000 Units Subcutaneous Q8H  . influenza vac split quadrivalent PF  0.5 mL Intramuscular Tomorrow-1000  . insulin aspart  0-15 Units Subcutaneous TID WC  . insulin aspart  0-5 Units Subcutaneous QHS  . lactobacillus  1 g Oral TID WC  . lip balm  1 application Topical BID  . nicotine  21 mg Transdermal Daily  . polycarbophil  625 mg Oral BID  . saccharomyces boulardii  250 mg Oral BID  . sodium chloride flush  10-40 mL Intracatheter Q12H  . sodium chloride flush  5 mL Intracatheter Q8H   Continuous Infusions: . sodium chloride Stopped (11/25/20 7169)  . methocarbamol (ROBAXIN) IV    . piperacillin-tazobactam (ZOSYN)  IV 12.5 mL/hr at 11/25/20 0700     LOS: 10 days     Cordelia Poche, MD [facility] 11/25/2020, 1:01 PM  If 7PM-7AM, please contact night-coverage [email or web address]

## 2020-11-25 NOTE — Progress Notes (Signed)
Subjective: Patient doesn't feel as well today.  Had nausea and some vomiting yesterday.  Otherwise no other new complaints.  Denies any COVID symptoms  ROS: See above, otherwise other systems negative  Objective: Vital signs in last 24 hours: Temp:  [97.7 F (36.5 C)-98.7 F (37.1 C)] 97.7 F (36.5 C) (02/14 0520) Pulse Rate:  [78-91] 85 (02/14 0634) Resp:  [14-17] 14 (02/14 0634) BP: (130-146)/(87-118) 134/94 (02/14 0634) SpO2:  [94 %-97 %] 96 % (02/14 0634) Last BM Date: 11/23/20  Intake/Output from previous day: 02/13 0701 - 02/14 0700 In: 657.9 [P.O.:360; I.V.:96.5; IV Piggyback:191.5] Out: 2685 [Urine:2625; Drains:60] Intake/Output this shift: Total I/O In: 240 [P.O.:240] Out: -   PE: Abd: soft, mild tenderness, few BS, JP drain with serosang output, not really distended  Lab Results:  Recent Labs    11/24/20 0311 11/25/20 0304  WBC 13.4* 11.7*  HGB 9.1* 9.7*  HCT 29.5* 31.4*  PLT 898* 920*   BMET Recent Labs    11/24/20 0311 11/25/20 0304  NA 131* 130*  K 4.5 4.5  CL 99 96*  CO2 23 25  GLUCOSE 142* 110*  BUN 20 20  CREATININE 0.63 0.72  CALCIUM 8.5* 8.9   PT/INR No results for input(s): LABPROT, INR in the last 72 hours. CMP     Component Value Date/Time   NA 130 (L) 11/25/2020 0304   K 4.5 11/25/2020 0304   CL 96 (L) 11/25/2020 0304   CO2 25 11/25/2020 0304   GLUCOSE 110 (H) 11/25/2020 0304   BUN 20 11/25/2020 0304   CREATININE 0.72 11/25/2020 0304   CALCIUM 8.9 11/25/2020 0304   PROT 6.7 11/25/2020 0304   ALBUMIN 2.2 (L) 11/25/2020 0304   AST 34 11/25/2020 0304   ALT 43 11/25/2020 0304   ALKPHOS 109 11/25/2020 0304   BILITOT 0.6 11/25/2020 0304   GFRNONAA >60 11/25/2020 0304   Lipase  No results found for: LIPASE     Studies/Results: No results found.  Anti-infectives: Anti-infectives (From admission, onward)   Start     Dose/Rate Route Frequency Ordered Stop   11/16/20 1000  remdesivir 100 mg in sodium chloride  0.9 % 100 mL IVPB       "Followed by" Linked Group Details   100 mg 200 mL/hr over 30 Minutes Intravenous Daily 11/15/20 2135 11/17/20 1002   11/15/20 2230  piperacillin-tazobactam (ZOSYN) IVPB 3.375 g        3.375 g 12.5 mL/hr over 240 Minutes Intravenous Every 8 hours 11/15/20 2144     11/15/20 2134  remdesivir 200 mg in sodium chloride 0.9% 250 mL IVPB       "Followed by" Linked Group Details   200 mg 580 mL/hr over 30 Minutes Intravenous Once 11/15/20 2135 11/16/20 0029   11/15/20 1700  piperacillin-tazobactam (ZOSYN) IVPB 3.375 g        3.375 g 100 mL/hr over 30 Minutes Intravenous  Once 11/15/20 1657 11/15/20 1826       Assessment/Plan Anemia Hyponatremia Tobacco abuse COVID-19positive  Severe malnutrition - prealbumin 5.8 (2/8), continue TPN  Colonmass with perforationand RTP abscess Stage IV adenocarcinomacolon - concerning for cancer - at some point he is going to require surgical resection, but ideally this can be delayed; if he does not improve he would need surgery this admission which would likely include colon resection and ileostomy - WBC down to 11.7 today despite not feeling as well - s/p IR drain placement 2/7 - cx Enterobacter  aerogenes - continue drain and IV zosyn -patient has repeat CT scan ordered for today.  Will await these results as he isn't feeling as well today.  Liver lesions - x3 noted on MRI, unsure if abscess vs metastatic disease.IR liver biopsy 11/20/2020; -A. LIVER, RIGHT MASS, NEEDLE CORE BIOPSY:  - Adenocarcinoma.  - See comment.  COMMENT:  The morphology suggests metastatic colorectal adenocarcinoma.  Immunohistochemistry will be performed and reported as an addendum.  There is sufficient tissue for additional testing.  Oncology recommendations:1.continue JP drain/antibiotics; 2. Surgical resection primary mass by general surgery when stable; 3.Outpatient genetic testing/no chemotherapy at this point; 4.GI tumor board  next week  ID -zosyn 2/4>> FEN - full liquids VTE:  heparin Foley -none   LOS: 10 days    Eric [name] , [facility] 11/25/2020, 10:22 AM Please see Amion for pager number during day hours 7:00am-4:30pm or 7:00am -11:30am on weekends

## 2020-11-25 NOTE — Plan of Care (Signed)
  Problem: Education: Goal: Knowledge of risk factors and measures for prevention of condition will improve Outcome: Progressing   Problem: Coping: Goal: Psychosocial and spiritual needs will be supported Outcome: Progressing   Problem: Respiratory: Goal: Will maintain a patent airway Outcome: Progressing Goal: Complications related to the disease process, condition or treatment will be avoided or minimized Outcome: Progressing   Problem: Clinical Measurements: Goal: Will remain free from infection Outcome: Progressing Goal: Diagnostic test results will improve Outcome: Progressing   Problem: Activity: Goal: Risk for activity intolerance will decrease Outcome: Progressing   Problem: Nutrition: Goal: Adequate nutrition will be maintained Outcome: Progressing   Problem: Elimination: Goal: Will not experience complications related to bowel motility Outcome: Progressing   Problem: Pain Managment: Goal: General experience of comfort will improve Outcome: Progressing   Problem: Skin Integrity: Goal: Risk for impaired skin integrity will decrease Outcome: Progressing

## 2020-11-25 NOTE — Progress Notes (Signed)
CRITICAL VALUE ALERT  Critical Value:  Plt 920  Date & Time Notied:  11/25/20  0413  Provider Notified:  Myna Hidalgo, MD  Orders Received/Actions taken:  No new orders.

## 2020-11-26 LAB — COMPREHENSIVE METABOLIC PANEL
ALT: 38 U/L (ref 0–44)
AST: 25 U/L (ref 15–41)
Albumin: 2.2 g/dL — ABNORMAL LOW (ref 3.5–5.0)
Alkaline Phosphatase: 126 U/L (ref 38–126)
Anion gap: 9 (ref 5–15)
BUN: 19 mg/dL (ref 6–20)
CO2: 25 mmol/L (ref 22–32)
Calcium: 9 mg/dL (ref 8.9–10.3)
Chloride: 94 mmol/L — ABNORMAL LOW (ref 98–111)
Creatinine, Ser: 0.8 mg/dL (ref 0.61–1.24)
GFR, Estimated: >60 mL/min (ref >60)
Glucose, Bld: 108 mg/dL — ABNORMAL HIGH (ref 70–99)
Potassium: 4.5 mmol/L (ref 3.5–5.1)
Sodium: 128 mmol/L — ABNORMAL LOW (ref 135–145)
Total Bilirubin: 0.4 mg/dL (ref 0.3–1.2)
Total Protein: 6.4 g/dL — ABNORMAL LOW (ref 6.5–8.1)

## 2020-11-26 LAB — CBC
HCT: 32.8 % — ABNORMAL LOW (ref 39.0–52.0)
Hemoglobin: 9.8 g/dL — ABNORMAL LOW (ref 13.0–17.0)
MCH: 22.7 pg — ABNORMAL LOW (ref 26.0–34.0)
MCHC: 29.9 g/dL — ABNORMAL LOW (ref 30.0–36.0)
MCV: 76.1 fL — ABNORMAL LOW (ref 80.0–100.0)
Platelets: 915 10*3/uL (ref 150–400)
RBC: 4.31 MIL/uL (ref 4.22–5.81)
WBC: 9.9 10*3/uL (ref 4.0–10.5)
nRBC: 0 % (ref 0.0–0.2)

## 2020-11-26 LAB — GLUCOSE, CAPILLARY
Glucose-Capillary: 112 mg/dL — ABNORMAL HIGH (ref 70–99)
Glucose-Capillary: 120 mg/dL — ABNORMAL HIGH (ref 70–99)
Glucose-Capillary: 93 mg/dL (ref 70–99)
Glucose-Capillary: 129 mg/dL — ABNORMAL HIGH (ref 70–99)

## 2020-11-26 MED ORDER — SODIUM CHLORIDE 1 G PO TABS
1.0000 g | ORAL_TABLET | Freq: Two times a day (BID) | ORAL | Status: DC
  Administered 2020-11-26 – 2020-11-30 (×8): 1 g via ORAL
  Filled 2020-11-26 (×11): qty 1

## 2020-11-26 NOTE — Evaluation (Signed)
Occupational Therapy Evaluation/Discharge Patient Details Name: Eric [name] MRN: [id number] DOB: [birth year] Today's Date: 11/26/2020    History of Present Illness Pt is a 50 y.o. male admitted 11/15/20 with R groin pain; incidental (+) COVID-19. 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 S/p percutaneous drain placement 2/7. Liver lesion biopsy 2/9. PMH includes tobacco use.   Clinical Impression   PTA, pt lives with friend and reports independence in all daily tasks without use of AD. Pt works as a Physiological scientist. Pt presents now with minor deficits in dynamic standing balance and strength though pt reports this is improving since admission. Pt able to demo ADLs in room without AD and without physical assistance. Pt able to ambulate in room without AD well though deficits noted with hallway mobility in a more distracting environment. Pt able to self correct balance without cues and anticipate pt to return to PLOF quickly with progression of activity. Discussed home safety, focusing on tub transfer techniques due to R LE weakness/sensation changes with pt verbalizing all understanding. VSS on RA throughout session. Anticipate no OT services needed at discharge. OT to sign off at acute level. Please reconsult if needs change.     Follow Up Recommendations  No OT follow up;Supervision - Intermittent    Equipment Recommendations  None recommended by OT    Recommendations for Other Services       Precautions / Restrictions Precautions Precautions: Fall;Other (comment) Precaution Comments: JP drain Restrictions Weight Bearing Restrictions: No      Mobility Bed Mobility Overal bed mobility: Independent                  Transfers Overall transfer level: Independent Equipment used: None             General transfer comment: Independent for sit to stand transfers, able to ambulate in room without AD and  fair balance though noted unsteadiness with hallway mobility with increased environmental distraction    Balance Overall balance assessment: Needs assistance Sitting-balance support: No upper extremity supported;Feet supported Sitting balance-Leahy Scale: Good     Standing balance support: No upper extremity supported;During functional activity Standing balance-Leahy Scale: Good Standing balance comment: able to ambulate without AD though unsteadiness noted when distracted or with moderate challenges                           ADL either performed or assessed with clinical judgement   ADL Overall ADL's : Modified independent;Independent                                       General ADL Comments: Able to don gown sitting EOB, don socks, performing grooming tasks at sink without assist. Pt able to ambulate short distances in room without AD at supervision level, benefits from min guard for hallway mobility due to scissoring/unsteadiness when distracted     Vision Baseline Vision/History: Wears glasses Wears Glasses: At all times Patient Visual Report: No change from baseline Vision Assessment?: No apparent visual deficits     Perception     Praxis      Pertinent Vitals/Pain Pain Assessment: No/denies pain (numbness in R thigh region)     Hand Dominance Right   Extremity/Trunk Assessment Upper Extremity Assessment Upper Extremity Assessment: Overall WFL for tasks assessed   Lower Extremity Assessment Lower Extremity  Assessment: Defer to PT evaluation   Cervical / Trunk Assessment Cervical / Trunk Assessment: Normal   Communication Communication Communication: No difficulties   Cognition Arousal/Alertness: Awake/alert Behavior During Therapy: WFL for tasks assessed/performed Overall Cognitive Status: Within Functional Limits for tasks assessed                                     General Comments  VSS on RA. Educated on  safety with tub transfers (use of tub bench as needed or having friend present to ensure safety). Pt agreeable with all energy/safety education. Pt reports improving sensation/strength of R LE though R thigh still numb. able to demo ability to lift LEs well (reports he could not do this a few days ago)    Exercises     Shoulder Instructions      Home Living Family/patient expects to be discharged to:: Private residence Living Arrangements: Non-relatives/Friends Available Help at Discharge: Friend(s);Available PRN/intermittently;Family Type of Home: House Home Access: Level entry (1 step at back door, level entry at front)     Home Layout: One level     Bathroom Shower/Tub: Teacher, early years/pre: Handicapped height     Home Equipment: Hand held shower head;Cane - single point;Tub bench;Walker - 2 wheels   Additional Comments: Friend works during the day but pt plans to stay at [facility] when friend at work, then return home when friend gets off work while recovering. Equipment is his late mother's that is in storage      Prior Functioning/Environment Level of Independence: Independent        Comments: Reports only used cane a week prior to admission due to progressive weakness. Works as Physiological scientist, Indepedent in all daily tasks. Has 3 cats, enjoys 18s rock/country music        OT Problem List:        OT Treatment/Interventions:      OT Goals(Current goals can be found in the care plan section) Acute Rehab OT Goals Patient Stated Goal: R LE to return to normal function, be able to go home soon OT Goal Formulation: All assessment and education complete, DC therapy  OT Frequency:     Barriers to D/C:            Co-evaluation              AM-PAC OT "6 Clicks" Daily Activity     Outcome Measure Help from another person eating meals?: None Help from another person taking care of personal grooming?: None Help from another person  toileting, which includes using toliet, bedpan, or urinal?: A Little Help from another person bathing (including washing, rinsing, drying)?: A Little Help from another person to put on and taking off regular upper body clothing?: None Help from another person to put on and taking off regular lower body clothing?: None 6 Click Score: 22   End of Session Equipment Utilized During Treatment: Gait belt Nurse Communication: Mobility status  Activity Tolerance: Patient tolerated treatment well Patient left: in chair;with call bell/phone within reach  OT Visit Diagnosis: Unsteadiness on feet (R26.81);Muscle weakness (generalized) (M62.81)                Time: 4132-4401 OT Time Calculation (min): 34 min Charges:  OT General Charges $OT Visit: 1 Visit OT Evaluation $OT Eval Moderate Complexity: 1 Mod OT Treatments $Self Care/Home Management : 8-22 mins  Almyra Free  Vevelyn Royals, OTR/L  Layla Maw 11/26/2020, 12:47 PM

## 2020-11-26 NOTE — Progress Notes (Signed)
PROGRESS NOTE    Eric [name]  JSE:[id number] DOB: [birth year] DOA: 11/15/2020 PCP: [facility], [facility]   Brief Narrative:  Eric [name] is a 50 y.o. male no known medical history. Patient presented secondary to right groin pain and found to have a retroperitoneal abscess/bowel perforation/cecal mass concerning for neoplasm. Incidental finding of COVID-19 positive status. General surgery consulted , with poor oral intake, protein calorie malnutrition, started on TPN, had IR drain of his retroperitoneal abscess, improving on IV Zosyn, biopsy of liver mass improved metastasis of primary colon adenocarcinoma.  Subjective: Reports no nausea no vomiting over last 24 hours, appetite has improved, ports passing gas, last bowel movement this morning .  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 -His oral intake has improved over last 24 hours, he is tolerating soft diet. -Repeat CT abdomen pelvis on 2/14 showing resolved right lower quadrant abscess, but still significant for noted cecal mass, surrounding lymphopathy, and evidence of partial obstruction. -Liver  lesion biopsy 2/9, significant for colorectal adenocarcinoma, oncology consulted.  They will follow as an outpatient, treatment will be done as an outpatient, after surgical issues and malnutrition has resolved. -Management per general surgery, appears will need surgery sooner than later, even if palliative to avoid obstruction.  Protein calorie malnutrition -On TPN, stopped 2/13, will reassess need  TPN need  pending CT and oral intake  Thrombocytosis -Due to infection/inflammation, reactive. -DVT prophylaxis   COVID-19 infection - Incidental. Asymptomatic. On room air.  Treated with 3 days of remdesivir -He can come off isolation today.  Iron deficiency anemia -Likely related to colonic mass, chronic inflammation in the setting of infectious process, requesting his PRBC transfusion on admission . -Choose another 2 units PRBC 2/9 with good response, continue to monitor CBC closely.  Hyponatremia Mild, Asymptomatic  Tobacco abuse Smoking cessation discussed. -Continue nicotine    DVT prophylaxis:  heparin Code Status:   Code Status: Full Code Family Communication: None at bedside, I have discussed with patient at length and answered all his questions. Disposition Plan: Discharge likely in several days pending general surgery recommendations for management.   Consultants:   General surgery  Interventional radiology  oncology  Procedures:  percutaneous drain per IR placed on 2/7, TPN IR biopsy of hepatic lesion 2/9  Antimicrobials:  Zosyn IV      Objective: Vitals:   11/25/20 1508 11/25/20 2031 11/26/20 0444 11/26/20 1439  BP: 109/90 (!) 125/91 135/85 117/76  Pulse:  83 88   Resp:  15 15   Temp: 98.5 F (36.9 C) (!) 97.4 F (36.3 C) 98 F (36.7 C) 98.2 F (36.8 C)  TempSrc: Oral Axillary  Oral Oral  SpO2:  96% 94%   Weight:      Height:        Intake/Output Summary (Last 24 hours) at 11/26/2020 1523 Last data filed at 11/26/2020 1323 Gross per 24  hour  Intake 389.4 ml  Output 1665 ml  Net -1275.6 ml   Filed Weights   11/15/20 2000  Weight: 69.4 kg    Examination:  Awake Alert, Oriented X 3, frail, deconditioned no new F.N deficits, Normal affect Symmetrical Chest wall movement, Good air movement bilaterally, CTAB RRR,No Gallops,Rubs or new Murmurs, No Parasternal Heave +ve B.Sounds, Abd Soft, right lower quadrant tenderness, JP drain with some sanguinous discharge, less purulent, No rebound - guarding or rigidity. No Cyanosis, Clubbing or edema, No new Rash or bruise     Data Reviewed: I have personally reviewed following labs and imaging studies  CBC Lab Results  Component Value Date   WBC 9.9 11/26/2020   RBC 4.31 11/26/2020   HGB 9.8 (L) 11/26/2020   HCT 32.8 (L) 11/26/2020   MCV 76.1 (L) 11/26/2020   MCH 22.7 (L) 11/26/2020   PLT 915 (HH) 11/26/2020   MCHC 29.9 (L) 11/26/2020   RDW Not Measured 11/26/2020   LYMPHSABS 1.4 11/20/2020   MONOABS 0.7 11/20/2020   EOSABS 0.1 11/20/2020   BASOSABS 0.1 19/14/7829     Last metabolic panel Lab Results  Component Value Date   NA 128 (L) 11/26/2020   K 4.5 11/26/2020   CL 94 (L) 11/26/2020   CO2 25 11/26/2020   BUN 19 11/26/2020   CREATININE 0.80 11/26/2020   GLUCOSE 108 (H) 11/26/2020   GFRNONAA >60 11/26/2020   CALCIUM 9.0 11/26/2020   PHOS 5.7 (H) 11/25/2020   PROT 6.4 (L) 11/26/2020   ALBUMIN 2.2 (L) 11/26/2020   BILITOT 0.4 11/26/2020   ALKPHOS 126 11/26/2020   AST 25 11/26/2020   ALT 38 11/26/2020   ANIONGAP 9 11/26/2020    CBG (last 3)  Recent Labs    11/25/20 2035 11/26/20 0730 11/26/20 1218  GLUCAP 105* 112* 120*     GFR: Estimated Creatinine Clearance: 107.2 mL/min (by C-G formula based on SCr of 0.8 mg/dL).  Coagulation Profile: No results for input(s): INR, PROTIME in the last 168 hours.  Recent Results (from the past 240 hour(s))  Aerobic/Anaerobic Culture (surgical/deep wound)     Status: None   Collection Time: 11/18/20   4:34 PM   Specimen: Abscess  Result Value Ref Range Status   Specimen Description ABSCESS  Final   Special Requests ABDOMEN  Final   Gram Stain   Final    FEW WBC PRESENT, PREDOMINANTLY PMN MODERATE GRAM NEGATIVE RODS FEW GRAM POSITIVE COCCI IN PAIRS IN CHAINS FEW GRAM POSITIVE RODS    Culture   Final    FEW ENTEROBACTER AEROGENES MODERATE STREPTOCOCCUS GROUP F Beta hemolytic streptococci are predictably susceptible to penicillin and other beta lactams. Susceptibility testing not routinely performed. ABUNDANT BACTEROIDES FRAGILIS BETA LACTAMASE POSITIVE Performed at [facility], [place] [address] [place], [place] [address]    Report Status 11/21/2020 FINAL  Final   Organism ID, Bacteria ENTEROBACTER AEROGENES  Final      Susceptibility   Enterobacter aerogenes - MIC*    CEFAZOLIN >=64 RESISTANT Resistant     CEFEPIME <=0.12 SENSITIVE Sensitive     CEFTAZIDIME <=1 SENSITIVE Sensitive     CEFTRIAXONE <=0.25 SENSITIVE Sensitive     CIPROFLOXACIN <=0.25 SENSITIVE Sensitive     GENTAMICIN <=1 SENSITIVE  Sensitive     IMIPENEM 0.5 SENSITIVE Sensitive     TRIMETH/SULFA <=20 SENSITIVE Sensitive     PIP/TAZO <=4 SENSITIVE Sensitive     * FEW ENTEROBACTER AEROGENES  Aerobic/Anaerobic Culture (surgical/deep wound)     Status: None   Collection Time: 11/20/20  4:22 PM   Specimen: Biopsy; Tissue  Result Value Ref Range Status   Specimen Description BIOPSY LIVER  Final   Special Requests Normal  Final   Gram Stain   Final    RARE WBC PRESENT, PREDOMINANTLY PMN NO ORGANISMS SEEN    Culture   Final    No growth aerobically or anaerobically. Performed at [facility], [place] [address] [place], [place] [address]    Report Status 11/25/2020 FINAL  Final        Radiology Studies: CT ABDOMEN PELVIS W CONTRAST  Result Date: 11/26/2020 CLINICAL DATA:  Abdominal pain, abscess suspected, history of colon cancer EXAM: CT ABDOMEN AND PELVIS WITH CONTRAST TECHNIQUE: Multidetector  CT imaging of the abdomen and pelvis was performed using the standard protocol following bolus administration of intravenous contrast. CONTRAST:  169mL OMNIPAQUE IOHEXOL 300 MG/ML  SOLN COMPARISON:  11/15/2020 FINDINGS: Lower chest: No acute abnormality. Hepatobiliary: Redemonstrated ill-defined hypodense lesion of the subcapsular inferior right lobe of the liver, hepatic segment VI, measuring 3.6 x 2.4 cm (series 10, image 29). No gallstones, gallbladder wall thickening, or biliary dilatation. Pancreas: Unremarkable. No pancreatic ductal dilatation or surrounding inflammatory changes. Spleen: Normal in size without significant abnormality. Adrenals/Urinary Tract: Adrenal glands are unremarkable. Kidneys are normal, without renal calculi, solid lesion, or hydronephrosis. Bladder is unremarkable. Stomach/Bowel: Stomach is within normal limits. The terminal small bowel is distended, measuring up to 5.0 cm in caliber. 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 Vascular/Lymphatic: Aortic atherosclerosis. Enlarged lymph nodes in the right lower quadrant mesocolon measuring up to 1.5 x 1.1 cm (series 10, image 46). Reproductive: No mass or other significant abnormality. Other: No abdominal wall hernia or abnormality. There has been interval placement of a right lower quadrant percutaneous pigtail drainage catheter within a retroperitoneal fluid collection, now almost completely resolved with no discretely visualized fluid remaining (series 10, image 42, 51). Trace ascites in the pelvis. Musculoskeletal: No acute or significant osseous findings. IMPRESSION: 1. There has been interval placement of a right lower quadrant percutaneous pigtail drainage catheter within a retroperitoneal fluid collection, now almost completely resolved with no discretely visualized fluid remaining. 2.  There is a redemonstrated large, fungating mass of the cecal base centered around the ileocecal valve, measuring at least 11.9 x 9.1 x 8.4 cm. Enlarged lymph nodes in the right lower quadrant mesocolon measuring up to 1.5 x 1.1 cm. 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 3. The terminal small bowel is distended, measuring up to 5.0 cm in caliber. Findings are consistent with at partial small bowel obstruction due to obstipating mass. 4. Redemonstrated ill-defined hypodense lesion of the subcapsular inferior right lobe of the liver, hepatic segment VI, which has been biopsied to evaluate for metastatic disease. 5. Trace ascites in the pelvis. Aortic Atherosclerosis (ICD10-I70.0). Electronically Signed   By: Eddie Candle M.D.   On: 11/26/2020 08:33      Scheduled Meds: . bisacodyl  10 mg Rectal Daily  . Chlorhexidine Gluconate Cloth  6 each Topical  Daily  . feeding supplement  237 mL Oral QID  . ferrous sulfate  325 mg Oral BID WC  . heparin injection (subcutaneous)  5,000 Units Subcutaneous Q8H  . influenza vac split quadrivalent PF  0.5 mL Intramuscular Tomorrow-1000  . insulin aspart  0-15 Units Subcutaneous TID WC  . insulin aspart  0-5 Units Subcutaneous QHS  . lactobacillus  1 g Oral TID WC  . lip balm  1 application Topical BID  . nicotine  21 mg Transdermal Daily  . polycarbophil  625 mg Oral BID  . polyethylene glycol  17 g Oral BID  . saccharomyces boulardii  250 mg Oral BID  . sodium chloride flush  10-40 mL Intracatheter Q12H  . sodium chloride flush  5 mL Intracatheter Q8H  . sodium chloride  1 g Oral BID WC   Continuous Infusions: . sodium chloride Stopped (11/25/20 3343)  . methocarbamol (ROBAXIN) IV    . piperacillin-tazobactam (ZOSYN)  IV 3.375 g (11/26/20 1459)     LOS: 11 days     Cordelia Poche, MD [facility] 11/26/2020, 3:23 PM  If 7PM-7AM, please contact night-coverage [email or web address]

## 2020-11-26 NOTE — Progress Notes (Signed)
Report given to accepting RN on 6N. Pt will go to Rm 17 when tx.

## 2020-11-26 NOTE — Evaluation (Signed)
Physical Therapy Evaluation Patient Details Name: Eric [name] MRN: [id number] DOB: [birth year] Today's Date: 11/26/2020   History of Present Illness  Pt is a 50 y.o. male admitted 11/15/20 with R groin pain; incidental (+) COVID-19. 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 S/p percutaneous drain placement 2/7. Liver lesion biopsy 2/9. PMH includes tobacco use.    Clinical Impression  Pt presents with an overall decrease in functional mobility secondary to above. PTA, pt independent, lives with a roommate and works; has been using SPC days leading to admission due to RLE pain. Today, pt mobilizing well without DME, intermittent supervision due to mild higher level balance impairments. Pt would benefit from continued acute PT services to maximize functional mobility and independence prior to d/c home.     Follow Up Recommendations No PT follow up    Equipment Recommendations  None recommended by PT    Recommendations for Other Services       Precautions / Restrictions Precautions Precautions: Fall;Other (comment) Precaution Comments: JP drain Restrictions Weight Bearing Restrictions: No      Mobility  Bed Mobility Overal bed mobility: Independent             General bed mobility comments: Received sitting in recliner    Transfers Overall transfer level: Independent Equipment used: None             General transfer comment: Independent without DME, good awareness to manage JP drain and lines  Ambulation/Gait Ambulation/Gait assistance: Supervision Gait Distance (Feet): 800 Feet Assistive device: None Gait Pattern/deviations: Step-through pattern;Decreased stride length;Antalgic Gait velocity: Decreased   General Gait Details: Mostly steady gait, somewhat antalgic with R-side groin/hip soreness around drain site; only instability noted with "high knee" hip flexion when testing higher level  balance  Stairs            Wheelchair Mobility    Modified Rankin (Stroke Patients Only)       Balance Overall balance assessment: Needs assistance Sitting-balance support: No upper extremity supported;Feet supported Sitting balance-Leahy Scale: Good     Standing balance support: No upper extremity supported;During functional activity Standing balance-Leahy Scale: Good Standing balance comment: able to ambulate without AD though unsteadiness noted when distracted or with moderate challenges               High Level Balance Comments: 1x self-corrected LOB with high knee hip flexion Standardized Balance Assessment Standardized Balance Assessment : Dynamic Gait Index   Dynamic Gait Index Level Surface: Mild Impairment Change in Gait Speed: Mild Impairment Gait with Horizontal Head Turns: Mild Impairment Gait with Vertical Head Turns: Normal Gait and Pivot Turn: Normal Step Over Obstacle: Normal Step Around Obstacles: Normal Steps: Mild Impairment Total Score: 20       Pertinent Vitals/Pain Pain Assessment: Faces Faces Pain Scale: Hurts a little bit Pain Location: R-side groin soreness Pain Descriptors / Indicators: Sore Pain Intervention(s): Monitored during session    Home Living Family/patient expects to be discharged to:: Private residence Living Arrangements: Non-relatives/Friends Available Help at Discharge: Family;Friend(s);Available 24 hours/day Type of Home: House Home Access: Level entry     Home Layout: One level Home Equipment: Hand held shower head;Cane - single point;Tub bench;Walker - 2 wheels Additional Comments: Pt lives with friend who works during day, so he plans to stay at [employer] while friend at work, then return home when friend's off; sister can provide transportation    Prior Function Level of Independence: Independent  Comments: Reports only used cane a week prior to admission due to progressive weakness.  Works as Physiological scientist, Indepedent in all daily tasks. Has 3 cats, enjoys 8s rock/country music and watching movies     Hand Dominance   Dominant Hand: Right    Extremity/Trunk Assessment   Upper Extremity Assessment Upper Extremity Assessment: Overall WFL for tasks assessed    Lower Extremity Assessment Lower Extremity Assessment: Overall WFL for tasks assessed;RLE deficits/detail RLE Deficits / Details: Soreness in groin with R-side hip flexion; strength functionally >3/5 throughout    Cervical / Trunk Assessment Cervical / Trunk Assessment: Normal  Communication   Communication: No difficulties  Cognition Arousal/Alertness: Awake/alert Behavior During Therapy: WFL for tasks assessed/performed Overall Cognitive Status: Within Functional Limits for tasks assessed                                        General Comments General comments (skin integrity, edema, etc.): SpO2 98% on RA, HR 70s-80s    Exercises Other Exercises Other Exercises: Standing LE HEP (w/ UE support) handout provided - demonstrated partial squats, calf raises, marching in place, hip abd/ext   Assessment/Plan    PT Assessment Patient needs continued PT services  PT Problem List Decreased balance;Decreased mobility;Pain       PT Treatment Interventions Gait training;Stair training;Functional mobility training;Therapeutic activities;Therapeutic exercise;Balance training;Patient/family education    PT Goals (Current goals can be found in the Care Plan section)  Acute Rehab PT Goals Patient Stated Goal: R LE to return to normal function, be able to go home soon PT Goal Formulation: With patient Time For Goal Achievement: 12/10/20 Potential to Achieve Goals: Good    Frequency Min 3X/week   Barriers to discharge        Co-evaluation               AM-PAC PT "6 Clicks" Mobility  Outcome Measure Help needed turning from your back to your side while in a flat bed without  using bedrails?: None Help needed moving from lying on your back to sitting on the side of a flat bed without using bedrails?: None Help needed moving to and from a bed to a chair (including a wheelchair)?: None Help needed standing up from a chair using your arms (e.g., wheelchair or bedside chair)?: None Help needed to walk in hospital room?: A Little Help needed climbing 3-5 steps with a railing? : A Little 6 Click Score: 22    End of Session   Activity Tolerance: Patient tolerated treatment well Patient left: in chair;with call bell/phone within reach Nurse Communication: Mobility status PT Visit Diagnosis: Other abnormalities of gait and mobility (R26.89)    Time: 1230-1250 PT Time Calculation (min) (ACUTE ONLY): 20 min   Charges:   PT Evaluation $PT Eval Moderate Complexity: [place], PT, DPT Acute Rehabilitation Services  Pager [phone] Office [place] 11/26/2020, 1:26 PM

## 2020-11-26 NOTE — Progress Notes (Signed)
Subjective: Patient states he tolerated almost 100% of breakfast this AM.  Reports having a BM this AM, non-bloody. Denies any new sxs. Denies any COVID symptoms  ROS: See above, otherwise other systems negative  Objective: Vital signs in last 24 hours: Temp:  [97.4 F (36.3 C)-98.5 F (36.9 C)] 98 F (36.7 C) (02/15 0444) Pulse Rate:  [83-88] 88 (02/15 0444) Resp:  [15] 15 (02/15 0444) BP: (109-135)/(85-91) 135/85 (02/15 0444) SpO2:  [94 %-96 %] 94 % (02/15 0444) Last BM Date: 11/23/20  Intake/Output from previous day: 02/14 0701 - 02/15 0700 In: 389.4 [P.O.:240; IV Piggyback:144.4] Out: 2155 [Urine:2125; Drains:30] Intake/Output this shift: Total I/O In: 240 [P.O.:240] Out: 200 [Urine:200]  PE: Abd: soft, mild tenderness, minimal distention, +BS, JP drain with SS output   Lab Results:  Recent Labs    11/25/20 0304 11/26/20 0311  WBC 11.7* 9.9  HGB 9.7* 9.8*  HCT 31.4* 32.8*  PLT 920* 915*   BMET Recent Labs    11/25/20 0304 11/26/20 0311  NA 130* 128*  K 4.5 4.5  CL 96* 94*  CO2 25 25  GLUCOSE 110* 108*  BUN 20 19  CREATININE 0.72 0.80  CALCIUM 8.9 9.0   PT/INR No results for input(s): LABPROT, INR in the last 72 hours. CMP     Component Value Date/Time   NA 128 (L) 11/26/2020 0311   K 4.5 11/26/2020 0311   CL 94 (L) 11/26/2020 0311   CO2 25 11/26/2020 0311   GLUCOSE 108 (H) 11/26/2020 0311   BUN 19 11/26/2020 0311   CREATININE 0.80 11/26/2020 0311   CALCIUM 9.0 11/26/2020 0311   PROT 6.4 (L) 11/26/2020 0311   ALBUMIN 2.2 (L) 11/26/2020 0311   AST 25 11/26/2020 0311   ALT 38 11/26/2020 0311   ALKPHOS 126 11/26/2020 0311   BILITOT 0.4 11/26/2020 0311   GFRNONAA >60 11/26/2020 0311   Lipase  No results found for: LIPASE     Studies/Results: CT ABDOMEN PELVIS W CONTRAST  Result Date: 11/26/2020 CLINICAL DATA:  Abdominal pain, abscess suspected, history of colon cancer EXAM: CT ABDOMEN AND PELVIS WITH CONTRAST TECHNIQUE:  Multidetector CT imaging of the abdomen and pelvis was performed using the standard protocol following bolus administration of intravenous contrast. CONTRAST:  174mL OMNIPAQUE IOHEXOL 300 MG/ML  SOLN COMPARISON:  11/15/2020 FINDINGS: Lower chest: No acute abnormality. Hepatobiliary: Redemonstrated ill-defined hypodense lesion of the subcapsular inferior right lobe of the liver, hepatic segment VI, measuring 3.6 x 2.4 cm (series 10, image 29). No gallstones, gallbladder wall thickening, or biliary dilatation. Pancreas: Unremarkable. No pancreatic ductal dilatation or surrounding inflammatory changes. Spleen: Normal in size without significant abnormality. Adrenals/Urinary Tract: Adrenal glands are unremarkable. Kidneys are normal, without renal calculi, solid lesion, or hydronephrosis. Bladder is unremarkable. Stomach/Bowel: Stomach is within normal limits. The terminal small bowel is distended, measuring up to 5.0 cm in caliber. 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 Vascular/Lymphatic: Aortic atherosclerosis. Enlarged lymph nodes in the right lower quadrant mesocolon measuring up to 1.5 x 1.1 cm (series 10, image 46). Reproductive: No mass or other significant abnormality. Other: No abdominal wall hernia or abnormality. There has been interval placement of a right lower quadrant percutaneous pigtail drainage catheter within a retroperitoneal fluid collection, now almost completely resolved with no discretely visualized fluid remaining (series 10,  image 42, 51). Trace ascites in the pelvis. Musculoskeletal: No acute or significant osseous findings. IMPRESSION: 1. There has been interval placement of a right lower quadrant percutaneous pigtail drainage catheter within a retroperitoneal fluid collection, now almost completely resolved with no discretely visualized fluid  remaining. 2. There is a redemonstrated large, fungating mass of the cecal base centered around the ileocecal valve, measuring at least 11.9 x 9.1 x 8.4 cm. Enlarged lymph nodes in the right lower quadrant mesocolon measuring up to 1.5 x 1.1 cm. 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 3. The terminal small bowel is distended, measuring up to 5.0 cm in caliber. Findings are consistent with at partial small bowel obstruction due to obstipating mass. 4. Redemonstrated ill-defined hypodense lesion of the subcapsular inferior right lobe of the liver, hepatic segment VI, which has been biopsied to evaluate for metastatic disease. 5. Trace ascites in the pelvis. Aortic Atherosclerosis (ICD10-I70.0). Electronically Signed   By: Eddie Candle M.D.   On: 11/26/2020 08:33    Anti-infectives: Anti-infectives (From admission, onward)   Start     Dose/Rate Route Frequency Ordered Stop   11/16/20 1000  remdesivir 100 mg in sodium chloride 0.9 % 100 mL IVPB       "Followed by" Linked Group Details   100 mg 200 mL/hr over 30 Minutes Intravenous Daily 11/15/20 2135 11/17/20 1002   11/15/20 2230  piperacillin-tazobactam (ZOSYN) IVPB 3.375 g        3.375 g 12.5 mL/hr over 240 Minutes Intravenous Every 8 hours 11/15/20 2144     11/15/20 2134  remdesivir 200 mg in sodium chloride 0.9% 250 mL IVPB       "Followed by" Linked Group Details   200 mg 580 mL/hr over 30 Minutes Intravenous Once 11/15/20 2135 11/16/20 0029   11/15/20 1700  piperacillin-tazobactam (ZOSYN) IVPB 3.375 g        3.375 g 100 mL/hr over 30 Minutes Intravenous  Once 11/15/20 1657 11/15/20 1826       Assessment/Plan Anemia Hyponatremia Tobacco abuse COVID-19positive  Severe malnutrition - prealbumin 5.8 (2/8), continue TPN  Colonmass with perforationand RTP abscess Stage IV adenocarcinomacolon  - WBC down to 9.9 today and clinically doing better (+BM, tolerated breakfast) -  s/p IR drain placement 2/7 - CT 2/14 w/ resolved RLQ abscess, again noted cecal mass and surrounding lymphadenopathy - cx Enterobacter aerogenes - continue drain and IV zosyn - at some point he is going to require surgical resection, but ideally this can be delayed; if he does not improve he would need surgery this admission which would likely include colon resection and ileostomy. Will review during tumor board this week.  Liver metastasis  - x3 noted on MRI, unsure if abscess vs metastatic disease.IR liver biopsy 11/20/2020; -A. LIVER, RIGHT MASS, NEEDLE CORE BIOPSY:  - Adenocarcinoma.  - See comment.  COMMENT:  The morphology suggests metastatic colorectal adenocarcinoma.  Immunohistochemistry will be performed and reported as an addendum.  There is sufficient tissue for additional testing.  Oncology recommendations:1.continue JP drain/antibiotics; 2. Surgical resection primary mass by general surgery when stable; 3.Outpatient genetic testing/no chemotherapy until after surgery; 4.GI tumor board this week.   ID -zosyn 2/4>> FEN - full liquids VTE:  heparin Foley -none   LOS: 11 days    Jill Alexanders , [facility] 11/26/2020, 11:52 AM Please see Amion for pager number during day hours 7:00am-4:30pm or 7:00am -11:30am on weekends

## 2020-11-26 NOTE — Progress Notes (Signed)
This chaplain is responding to PMT referral for notarizing the Pt. Advance Directive.    The chaplain understands after reading the Pt. chart, Covid isolation began with the onset of 11/15/20.  The Pt. will be in isolation for 21 days ending on 12/06/20.  Notarization of the Pt. AD is not possible until the Pt. is out of isolation.  The chaplain understands the Pt. wants his sister Eric [name] to be his HCPOA.  This chaplain is available for F/U spiritual care as needed.

## 2020-11-27 ENCOUNTER — Other Ambulatory Visit: Payer: Self-pay

## 2020-11-27 LAB — COMPREHENSIVE METABOLIC PANEL
ALT: 29 U/L (ref 0–44)
AST: 20 U/L (ref 15–41)
Albumin: 2.2 g/dL — ABNORMAL LOW (ref 3.5–5.0)
Alkaline Phosphatase: 103 U/L (ref 38–126)
Anion gap: 10 (ref 5–15)
BUN: 16 mg/dL (ref 6–20)
CO2: 24 mmol/L (ref 22–32)
Calcium: 8.9 mg/dL (ref 8.9–10.3)
Chloride: 95 mmol/L — ABNORMAL LOW (ref 98–111)
Creatinine, Ser: 0.78 mg/dL (ref 0.61–1.24)
GFR, Estimated: >60 mL/min (ref >60)
Glucose, Bld: 102 mg/dL — ABNORMAL HIGH (ref 70–99)
Potassium: 4.5 mmol/L (ref 3.5–5.1)
Sodium: 129 mmol/L — ABNORMAL LOW (ref 135–145)
Total Bilirubin: 0.7 mg/dL (ref 0.3–1.2)
Total Protein: 6.2 g/dL — ABNORMAL LOW (ref 6.5–8.1)

## 2020-11-27 LAB — GLUCOSE, CAPILLARY
Glucose-Capillary: 87 mg/dL (ref 70–99)
Glucose-Capillary: 158 mg/dL — ABNORMAL HIGH (ref 70–99)
Glucose-Capillary: 105 mg/dL — ABNORMAL HIGH (ref 70–99)
Glucose-Capillary: 137 mg/dL — ABNORMAL HIGH (ref 70–99)

## 2020-11-27 LAB — CBC
HCT: 28.9 % — ABNORMAL LOW (ref 39.0–52.0)
Hemoglobin: 9.1 g/dL — ABNORMAL LOW (ref 13.0–17.0)
MCH: 23.6 pg — ABNORMAL LOW (ref 26.0–34.0)
MCHC: 31.5 g/dL (ref 30.0–36.0)
MCV: 75.1 fL — ABNORMAL LOW (ref 80.0–100.0)
Platelets: 831 10*3/uL — ABNORMAL HIGH (ref 150–400)
RBC: 3.85 MIL/uL — ABNORMAL LOW (ref 4.22–5.81)
WBC: 14.1 10*3/uL — ABNORMAL HIGH (ref 4.0–10.5)
nRBC: 0 % (ref 0.0–0.2)

## 2020-11-27 NOTE — Progress Notes (Signed)
Subjective: Tolerated PO yesterday, denies N/V. Confirms a moderate size, non-bloody BM yeterday AM. No new complaints.  ROS: See above, otherwise other systems negative  Objective: Vital signs in last 24 hours: Temp:  [98 F (36.7 C)-98.6 F (37 C)] 98.2 F (36.8 C) (02/16 0828) Pulse Rate:  [73-80] 75 (02/16 0828) Resp:  [17] 17 (02/16 0828) BP: (112-122)/(76-84) 112/83 (02/16 0828) SpO2:  [97 %] 97 % (02/16 0523) Last BM Date: 11/23/20  Intake/Output from previous day: 02/15 0701 - 02/16 0700 In: 365 [P.O.:360] Out: 528 [Urine:500; Drains:28] Intake/Output this shift: No intake/output data recorded.  PE: Abd: soft, mild RLQ tenderness, minimal distention, +BS, JP drain with SS output   Lab Results:  Recent Labs    11/26/20 0311 11/27/20 0341  WBC 9.9 14.1*  HGB 9.8* 9.1*  HCT 32.8* 28.9*  PLT 915* 831*   BMET Recent Labs    11/26/20 0311 11/27/20 0341  NA 128* 129*  K 4.5 4.5  CL 94* 95*  CO2 25 24  GLUCOSE 108* 102*  BUN 19 16  CREATININE 0.80 0.78  CALCIUM 9.0 8.9   PT/INR No results for input(s): LABPROT, INR in the last 72 hours. CMP     Component Value Date/Time   NA 129 (L) 11/27/2020 0341   K 4.5 11/27/2020 0341   CL 95 (L) 11/27/2020 0341   CO2 24 11/27/2020 0341   GLUCOSE 102 (H) 11/27/2020 0341   BUN 16 11/27/2020 0341   CREATININE 0.78 11/27/2020 0341   CALCIUM 8.9 11/27/2020 0341   PROT 6.2 (L) 11/27/2020 0341   ALBUMIN 2.2 (L) 11/27/2020 0341   AST 20 11/27/2020 0341   ALT 29 11/27/2020 0341   ALKPHOS 103 11/27/2020 0341   BILITOT 0.7 11/27/2020 0341   GFRNONAA >60 11/27/2020 0341   Lipase  No results found for: LIPASE     Studies/Results: CT ABDOMEN PELVIS W CONTRAST  Result Date: 11/26/2020 CLINICAL DATA:  Abdominal pain, abscess suspected, history of colon cancer EXAM: CT ABDOMEN AND PELVIS WITH CONTRAST TECHNIQUE: Multidetector CT imaging of the abdomen and pelvis was performed using the standard protocol  following bolus administration of intravenous contrast. CONTRAST:  175mL OMNIPAQUE IOHEXOL 300 MG/ML  SOLN COMPARISON:  11/15/2020 FINDINGS: Lower chest: No acute abnormality. Hepatobiliary: Redemonstrated ill-defined hypodense lesion of the subcapsular inferior right lobe of the liver, hepatic segment VI, measuring 3.6 x 2.4 cm (series 10, image 29). No gallstones, gallbladder wall thickening, or biliary dilatation. Pancreas: Unremarkable. No pancreatic ductal dilatation or surrounding inflammatory changes. Spleen: Normal in size without significant abnormality. Adrenals/Urinary Tract: Adrenal glands are unremarkable. Kidneys are normal, without renal calculi, solid lesion, or hydronephrosis. Bladder is unremarkable. Stomach/Bowel: Stomach is within normal limits. The terminal small bowel is distended, measuring up to 5.0 cm in caliber. 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 Vascular/Lymphatic: Aortic atherosclerosis. Enlarged lymph nodes in the right lower quadrant mesocolon measuring up to 1.5 x 1.1 cm (series 10, image 46). Reproductive: No mass or other significant abnormality. Other: No abdominal wall hernia or abnormality. There has been interval placement of a right lower quadrant percutaneous pigtail drainage catheter within a retroperitoneal fluid collection, now almost completely resolved with no discretely visualized fluid remaining (series 10, image 42, 51). Trace ascites in the pelvis. Musculoskeletal: No acute or significant osseous findings. IMPRESSION:  1. There has been interval placement of a right lower quadrant percutaneous pigtail drainage catheter within a retroperitoneal fluid collection, now almost completely resolved with no discretely visualized fluid remaining. 2. There is a redemonstrated large, fungating mass of the cecal base centered around  the ileocecal valve, measuring at least 11.9 x 9.1 x 8.4 cm. Enlarged lymph nodes in the right lower quadrant mesocolon measuring up to 1.5 x 1.1 cm. 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 3. The terminal small bowel is distended, measuring up to 5.0 cm in caliber. Findings are consistent with at partial small bowel obstruction due to obstipating mass. 4. Redemonstrated ill-defined hypodense lesion of the subcapsular inferior right lobe of the liver, hepatic segment VI, which has been biopsied to evaluate for metastatic disease. 5. Trace ascites in the pelvis. Aortic Atherosclerosis (ICD10-I70.0). Electronically Signed   By: Eddie Candle M.D.   On: 11/26/2020 08:33    Anti-infectives: Anti-infectives (From admission, onward)   Start     Dose/Rate Route Frequency Ordered Stop   11/16/20 1000  remdesivir 100 mg in sodium chloride 0.9 % 100 mL IVPB       "Followed by" Linked Group Details   100 mg 200 mL/hr over 30 Minutes Intravenous Daily 11/15/20 2135 11/17/20 1002   11/15/20 2230  piperacillin-tazobactam (ZOSYN) IVPB 3.375 g        3.375 g 12.5 mL/hr over 240 Minutes Intravenous Every 8 hours 11/15/20 2144     11/15/20 2134  remdesivir 200 mg in sodium chloride 0.9% 250 mL IVPB       "Followed by" Linked Group Details   200 mg 580 mL/hr over 30 Minutes Intravenous Once 11/15/20 2135 11/16/20 0029   11/15/20 1700  piperacillin-tazobactam (ZOSYN) IVPB 3.375 g        3.375 g 100 mL/hr over 30 Minutes Intravenous  Once 11/15/20 1657 11/15/20 1826       Assessment/Plan Anemia Hyponatremia Tobacco abuse COVID-19positive  Severe malnutrition - prealbumin 5.8 (2/8), continue TPN  Colonmass with perforationand RTP abscess Stage IV adenocarcinomacolon  - WBC down to 14 from 9.9 - s/p IR drain placement 2/7 - CT 2/14 w/ resolved RLQ abscess, again noted cecal mass and surrounding lymphadenopathy - cx Enterobacter aerogenes -  continue drain and IV zosyn - recommend surgical resection of primary cecal mass this admission - tentatively tomorrow 11/28/20. Patient will need laparotomy, partial colectomy, likely end ileostomy. I will ask urology to place a right ureteral stent prior to partial colectomy.   Liver metastasis  - x3 noted on MRI, unsure if abscess vs metastatic disease.IR liver biopsy 11/20/2020; -A. LIVER, RIGHT MASS, NEEDLE CORE BIOPSY:  - Adenocarcinoma.  - See comment.  COMMENT:  The morphology suggests metastatic colorectal adenocarcinoma.  Immunohistochemistry will be performed and reported as an addendum.  There is sufficient tissue for additional testing.  Oncology recommendations:1.continue JP drain/antibiotics; 2. Surgical resection primary mass by general surgery when stable; 3.Outpatient genetic testing/no chemotherapy until after surgery  ID -zosyn 2/4>> FEN - full liquids, NPO after MN VTE:  SQ heparin Foley -none   LOS: 12 days    Jill Alexanders , [facility] 11/27/2020, 10:02 AM Please see Amion for pager number during day hours 7:00am-4:30pm or 7:00am -11:30am on weekends

## 2020-11-27 NOTE — Anesthesia Preprocedure Evaluation (Addendum)
Anesthesia Evaluation  Patient identified by MRN, date of birth, ID band Patient awake    Reviewed: Allergy & Precautions, NPO status , Patient's Chart, lab work & pertinent test results  History of Anesthesia Complications Negative for: history of anesthetic complications  Airway Mallampati: I  TM Distance: >3 FB Neck ROM: Full    Dental  (+) Edentulous Upper, Missing, Dental Advisory Given   Pulmonary Current Smoker and Patient abstained from smoking.,    Pulmonary exam normal        Cardiovascular negative cardio ROS Normal cardiovascular exam     Neuro/Psych negative neurological ROS     GI/Hepatic negative GI ROS, Neg liver ROS, Colon mass   Endo/Other  negative endocrine ROS  Renal/GU negative Renal ROS     Musculoskeletal negative musculoskeletal ROS (+)   Abdominal   Peds  Hematology negative hematology ROS (+) anemia ,   Anesthesia Other Findings   Reproductive/Obstetrics                            Anesthesia Physical Anesthesia Plan  ASA: III  Anesthesia Plan: General   Post-op Pain Management:    Induction: Intravenous, Rapid sequence and Cricoid pressure planned  PONV Risk Score and Plan: 3 and Ondansetron, Midazolam and Scopolamine patch - Pre-op  Airway Management Planned: Oral ETT  Additional Equipment:   Intra-op Plan:   Post-operative Plan: Extubation in OR  Informed Consent: I have reviewed the patients History and Physical, chart, labs and discussed the procedure including the risks, benefits and alternatives for the proposed anesthesia with the patient or authorized representative who has indicated his/her understanding and acceptance.     Dental advisory given  Plan Discussed with: Anesthesiologist and CRNA  Anesthesia Plan Comments:        Anesthesia Quick Evaluation

## 2020-11-27 NOTE — Progress Notes (Signed)
Nutrition Follow-up  DOCUMENTATION CODES:   Non-severe (moderate) malnutrition in context of chronic illness  INTERVENTION:   -Continue Ensure Enlive po QID, each supplement provides 350 kcal and 20 grams of protein -MVI with minerals daily  NUTRITION DIAGNOSIS:   Moderate Malnutrition related to  (illness) as evidenced by severe muscle depletion,moderate fat depletion,moderate muscle depletion.  Ongoing  GOAL:   Patient will meet greater than or equal to 90% of their needs  Progressing   MONITOR:   PO intake,Supplement acceptance,Skin,Weight trends,Labs,I & O's (TPN tolerance)  REASON FOR ASSESSMENT:   Malnutrition Screening Tool    ASSESSMENT:   50 year old male with history significant for tobacco abuse admitted with sepsis due to right retroperitoneal abscess and found to be positive for COVID-19. Pt who has not had any routine medical care for the last 10 years presents with 5 day onset of significant right groin pain that has been progressive and constant associated with chills and night sweats.  2/7 TPN initiated, s/p retroperitoneal abscess drain placement 2/9 Liver lesion biopsy, significant for colorectal adenocarcinoma 2/12- advanced to soft diet  2/13- TPN d/c, downgraded to full liquid diet  Reviewed I/O's: -163 ml x 24 hours and -5.7 L since admission  UOP: 500 ml x 24 hours  Pt in with MD at time of visit.   Case discussed with RN, who reports pt is tolerating full liquid diet well and drinking his Ensure. He just finished an Ensure and ordered some food for lunch. Noted meal completion 25-100%  Per discussion with RN, pt will require surgical resection (uro stent placement and ex lap with colectomy and likely ileostomy), possibly tomorrow (11/28/20).  Medications reviewed and include miralax and florastor.   Labs reviewed: Na: 129, CBGS: 87-129 (inpatient orders for glycemic control are 0-15 units insulin aspart TID with meals and 0-5 units insulin  aspart daily at bedtime).   Diet Order:   Diet Order            Diet NPO time specified Except for: Sips with Meds  Diet effective midnight           Diet full liquid Room service appropriate? Yes; Fluid consistency: Thin  Diet effective now                 EDUCATION NEEDS:   Not appropriate for education at this time  Skin:  Skin Assessment: Skin Integrity Issues: Skin Integrity Issues:: Incisions Incisions: closed rt upper abdomen  Last BM:  11/26/20  Height:   Ht Readings from Last 1 Encounters:  11/15/20 6' (1.829 m)    Weight:   Wt Readings from Last 1 Encounters:  11/15/20 69.4 kg   BMI:  Body mass index is 20.75 kg/m.  Estimated Nutritional Needs:   Kcal:  2200-2430  Protein:  110-125 grams  Fluid:  >/= 2.2 L    Loistine Chance, RD, LDN, [place] Registered Dietitian II Certified Diabetes Care and Education Specialist Please refer to [facility] for RD and/or RD on-call/weekend/after hours pager

## 2020-11-27 NOTE — Progress Notes (Addendum)
PROGRESS NOTE    Eric [name]  KPV:[id number] DOB: 10/25/1970 DOA: 11/15/2020 PCP: [facility]    Chief Complaint  Patient presents with  . Groin Pain    Brief Narrative:  Eric [name] is a 50 y.o. male no known medical history. Patient presented secondary to right groin pain and found to have a retroperitoneal abscess/bowel perforation/cecal mass concerning for neoplasm. Incidental finding of COVID-19 positive status. General surgery consulted , with poor oral intake, protein calorie malnutrition, started on TPN, had IR drain of his retroperitoneal abscess, improving on IV Zosyn, biopsy of liver mass showed metastasis of primary colon adenocarcinoma.  Subjective:  He denies pain, no fever He is tolerating liquid diet, no nausea no vomiting States he is ready for surgery tomorrow Sister at bedside  Assessment & Plan:   Principal Problem:   Retroperitoneal abscess ([place]) Active Problems:   Colonic mass   Iron deficiency anemia due to chronic blood loss   Sepsis ([place])   COVID-19 virus infection   Thrombocytosis   Hyponatremia   Protein-calorie malnutrition, severe (HCC)   Liver mass, right lobe   Right groin pain   Ambulates with cane   Tobacco abuse   Abdominal pain, RLQ (right lower quadrant)   Fatigue   Symptomatic anemia   Malnutrition of moderate degree   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 ([place])   Sepsis present on admission due to perforated colon adenocarcinoma with retroperitoneal abscess -Status post IR drain, abscess culture positive for Enterobacter, he is currently on Zosyn -Plan for URo stent placement and ex lap with colectomy and likely ileostomy  tomorrow -WBC 21.4 on presentation, WBC nadir to 9.9 but fluctuating, today 14 -Blood culture no growth -Plan per general surgery  Blood loss anemia, likely from chronic GI bleed from colon cancer -Hemoglobin 5.4 on presentation -Received PRBC transfusion x4 unit -Hemoglobin  today 9  Thrombocytosis Likely reactive Platelet 1165 on presentation, platelet today 831  Hyponatremia -Has been hyponatremia since admission , sodium 129 on presentation ,sodium nadir at 124, today 129 -He does not appear symptomatic, clinically he is dry, however urine sodium is 148 -Increase sodium tablet from twice a day to 3 times daily   COVID-19 infection - Incidental. Asymptomatic. On room air.  Treated with 3 days of remdesivir -He can come off isolation on February 15  Nutritional Assessment:  The patient's BMI is: Body mass index is 20.75 kg/m.Marland Kitchen  Seen by dietician.  I agree with the assessment and plan as outlined below:  Nutrition Status: Nutrition Problem: Moderate Malnutrition Etiology:  (illness) Signs/Symptoms: severe muscle depletion,moderate fat depletion,moderate muscle depletion Interventions: Ensure Enlive (each supplement provides 350kcal and 20 grams of protein),TPN  Initially on TPN, off TPN February 13, currently on clear liquid, may need TPN again postop, plan per general surgery  Tobacco abuse -Smoking cessation education provided, continue nicotine patch    Unresulted Labs (From admission, onward)          Start     Ordered   12/02/20 0000  Prealbumin  Weekly,   R     Question:  Specimen collection method  Answer:  IV Team=IV Team collect   11/25/20 1234   11/28/20 0500  CBC  Tomorrow morning,   R       Question:  Specimen collection method  Answer:  IV Team=IV Team collect   11/27/20 1644   11/28/20 6754  Basic metabolic panel  Tomorrow morning,   R  Question:  Specimen collection method  Answer:  IV Team=IV Team collect   11/27/20 1644   11/28/20 0500  Magnesium  Tomorrow morning,   R       Question:  Specimen collection method  Answer:  IV Team=IV Team collect   11/27/20 1644             DVT prophylaxis: heparin injection 5,000 Units Start: 11/20/20 2200 SCDs Start: 11/15/20 2135   Code Status: Full Family  Communication: Sister at bedside Disposition:   Status is: Inpatient  Dispo: The patient is from: Home              Anticipated d/c is to: To be determined              Anticipated d/c date is:               Patient currently not ready to discharge  Consultants:   General surgery  Palliative care  Oncology Dr. Benay Spice  Interventional radiology  Procedures:    PICC line placement February 7  Retroperitoneal abscess drain placement on February 7  to OR tomorrow  Antimicrobials:   Anti-infectives (From admission, onward)   Start     Dose/Rate Route Frequency Ordered Stop   11/16/20 1000  remdesivir 100 mg in sodium chloride 0.9 % 100 mL IVPB       "Followed by" Linked Group Details   100 mg 200 mL/hr over 30 Minutes Intravenous Daily 11/15/20 2135 11/17/20 1002   11/15/20 2230  piperacillin-tazobactam (ZOSYN) IVPB 3.375 g        3.375 g 12.5 mL/hr over 240 Minutes Intravenous Every 8 hours 11/15/20 2144     11/15/20 2134  remdesivir 200 mg in sodium chloride 0.9% 250 mL IVPB       "Followed by" Linked Group Details   200 mg 580 mL/hr over 30 Minutes Intravenous Once 11/15/20 2135 11/16/20 0029   11/15/20 1700  piperacillin-tazobactam (ZOSYN) IVPB 3.375 g        3.375 g 100 mL/hr over 30 Minutes Intravenous  Once 11/15/20 1657 11/15/20 1826          Objective: Vitals:   11/27/20 0051 11/27/20 0523 11/27/20 0828 11/27/20 1436  BP: 122/82 122/84 112/83 134/89  Pulse: 78 80 75 93  Resp: 17 17 17 17   Temp: 98.2 F (36.8 C) 98 F (36.7 C) 98.2 F (36.8 C) 98.4 F (36.9 C)  TempSrc:   Oral Oral  SpO2: 97% 97%  98%  Weight:      Height:        Intake/Output Summary (Last 24 hours) at 11/27/2020 1628 Last data filed at 11/27/2020 1627 Gross per 24 hour  Intake 357.68 ml  Output 388 ml  Net -30.32 ml   Filed Weights   11/15/20 2000  Weight: 69.4 kg    Examination:  General exam: Thin ,calm, NAD Respiratory system: Clear to auscultation.  Respiratory effort normal. Cardiovascular system: S1 & S2 heard, RRR. No JVD, no murmur, No pedal edema. Gastrointestinal system: Abdomen is nondistended, soft and nontender. Normal bowel sounds heard.  Positive drain Central nervous system: Alert and oriented. No focal neurological deficits. Extremities: Symmetric 5 x 5 power. Skin: No rashes, lesions or ulcers Psychiatry: Judgement and insight appear normal. Mood & affect appropriate.     Data Reviewed: I have personally reviewed following labs and imaging studies  CBC: Recent Labs  Lab 11/23/20 0306 11/24/20 0311 11/25/20 0304 11/26/20 0311 11/27/20 0341  WBC 9.8  13.4* 11.7* 9.9 14.1*  HGB 8.9* 9.1* 9.7* 9.8* 9.1*  HCT 30.1* 29.5* 31.4* 32.8* 28.9*  MCV 76.2* 75.3* 74.6* 76.1* 75.1*  PLT 855* 898* 920* 915* 831*    Basic Metabolic Panel: Recent Labs  Lab 11/21/20 0345 11/22/20 0345 11/23/20 0306 11/24/20 0311 11/25/20 0304 11/26/20 0311 11/27/20 0341  NA 133* 131* 132* 131* 130* 128* 129*  K 4.6 4.5 4.4 4.5 4.5 4.5 4.5  CL 102 101 100 99 96* 94* 95*  CO2 23 22 24 23 25 25 24   GLUCOSE 142* 134* 124* 142* 110* 108* 102*  BUN 14 16 15 20 20 19 16   CREATININE 0.57* 0.57* 0.67 0.63 0.72 0.80 0.78  CALCIUM 8.3* 8.3* 8.6* 8.5* 8.9 9.0 8.9  MG 2.3 2.2  --  2.2 2.4  --   --   PHOS 3.9 4.2  --  4.8* 5.7*  --   --     GFR: Estimated Creatinine Clearance: 107.2 mL/min (by C-G formula based on SCr of 0.78 mg/dL).  Liver Function Tests: Recent Labs  Lab 11/23/20 0306 11/24/20 0311 11/25/20 0304 11/26/20 0311 11/27/20 0341  AST 37 45* 34 25 20  ALT 43 51* 43 38 29  ALKPHOS 109 106 109 126 103  BILITOT 0.4 0.1* 0.6 0.4 0.7  PROT 6.3* 6.4* 6.7 6.4* 6.2*  ALBUMIN 1.9* 2.0* 2.2* 2.2* 2.2*    CBG: Recent Labs  Lab 11/26/20 1218 11/26/20 1716 11/26/20 2133 11/27/20 0822 11/27/20 1148  GLUCAP 120* 93 129* 87 158*     Recent Results (from the past 240 hour(s))  Aerobic/Anaerobic Culture (surgical/deep  wound)     Status: None   Collection Time: 11/18/20  4:34 PM   Specimen: Abscess  Result Value Ref Range Status   Specimen Description ABSCESS  Final   Special Requests ABDOMEN  Final   Gram Stain   Final    FEW WBC PRESENT, PREDOMINANTLY PMN MODERATE GRAM NEGATIVE RODS FEW GRAM POSITIVE COCCI IN PAIRS IN CHAINS FEW GRAM POSITIVE RODS    Culture   Final    FEW ENTEROBACTER AEROGENES MODERATE STREPTOCOCCUS GROUP F Beta hemolytic streptococci are predictably susceptible to penicillin and other beta lactams. Susceptibility testing not routinely performed. ABUNDANT BACTEROIDES FRAGILIS BETA LACTAMASE POSITIVE Performed at [facility], [place] [address], [place], [place] [address]    Report Status 11/21/2020 FINAL  Final   Organism ID, Bacteria ENTEROBACTER AEROGENES  Final      Susceptibility   Enterobacter aerogenes - MIC*    CEFAZOLIN >=64 RESISTANT Resistant     CEFEPIME <=0.12 SENSITIVE Sensitive     CEFTAZIDIME <=1 SENSITIVE Sensitive     CEFTRIAXONE <=0.25 SENSITIVE Sensitive     CIPROFLOXACIN <=0.25 SENSITIVE Sensitive     GENTAMICIN <=1 SENSITIVE Sensitive     IMIPENEM 0.5 SENSITIVE Sensitive     TRIMETH/SULFA <=20 SENSITIVE Sensitive     PIP/TAZO <=4 SENSITIVE Sensitive     * FEW ENTEROBACTER AEROGENES  Aerobic/Anaerobic Culture (surgical/deep wound)     Status: None   Collection Time: 11/20/20  4:22 PM   Specimen: Biopsy; Tissue  Result Value Ref Range Status   Specimen Description BIOPSY LIVER  Final   Special Requests Normal  Final   Gram Stain   Final    RARE WBC PRESENT, PREDOMINANTLY PMN NO ORGANISMS SEEN    Culture   Final    No growth aerobically or anaerobically. Performed at [facility], [place] [address], [place], [place] [address]  Report Status 11/25/2020 FINAL  Final         Radiology Studies: No results found.      Scheduled Meds: . bisacodyl  10 mg Rectal Daily  . Chlorhexidine Gluconate Cloth  6 each Topical Daily  .  feeding supplement  237 mL Oral QID  . ferrous sulfate  325 mg Oral BID WC  . heparin injection (subcutaneous)  5,000 Units Subcutaneous Q8H  . influenza vac split quadrivalent PF  0.5 mL Intramuscular Tomorrow-1000  . insulin aspart  0-15 Units Subcutaneous TID WC  . insulin aspart  0-5 Units Subcutaneous QHS  . lactobacillus  1 g Oral TID WC  . lip balm  1 application Topical BID  . nicotine  21 mg Transdermal Daily  . polycarbophil  625 mg Oral BID  . polyethylene glycol  17 g Oral BID  . saccharomyces boulardii  250 mg Oral BID  . sodium chloride flush  10-40 mL Intracatheter Q12H  . sodium chloride flush  5 mL Intracatheter Q8H  . sodium chloride  1 g Oral BID WC   Continuous Infusions: . sodium chloride Stopped (11/25/20 6440)  . methocarbamol (ROBAXIN) IV    . piperacillin-tazobactam (ZOSYN)  IV 3.375 g (11/27/20 1410)     LOS: 12 days   Time spent: 62mins Greater than 50% of this time was spent in counseling, explanation of diagnosis, planning of further management, and coordination of care.   Voice Recognition Viviann Spare dictation system was used to create this note, attempts have been made to correct errors. Please contact the author with questions and/or clarifications.   Florencia Reasons, MD PhD FACP [facility]  Available via Epic secure chat 7am-7pm for nonurgent issues Please page for urgent issues To page the attending provider between 7A-7P or the covering provider during after hours 7P-7A, please log into the web site [email or web address] and access using universal [place] password for that web site. If you do not have the password, please call the hospital operator.    11/27/2020, 4:28 PM

## 2020-11-27 NOTE — Progress Notes (Signed)
The proposed treatment discussed in conference is for discussion purposes only and is not a binding recommendation.  The patients have not been physically examined, or presented with their treatment options.  Therefore, final treatment plans cannot be decided.   

## 2020-11-27 NOTE — Consult Note (Signed)
Urology Consult   Physician requesting consult: Dr. Rosendo Gros  Reason for consult: Need for bilateral ureteral stents  History of Present Illness: Eric [name] is a 50 y.o. with stage IV colon cancer with perforation and abscess s/p percutaneous drainage with plans for exploratory laparotomy and colon resection with bowel diversion tomorrow.  Dr. Rosendo Gros has requested ureteral stents to be placed to aid with intraoperative identification of ureters.   History reviewed. No pertinent past medical history.  History reviewed. No pertinent surgical history.  Current Hospital Medications:  Home Meds:  No current facility-administered medications on file prior to encounter.   Current Outpatient Medications on File Prior to Encounter  Medication Sig Dispense Refill  . acetaminophen (TYLENOL) 500 MG tablet Take 500 mg by mouth every 6 (six) hours as needed.    . Calcium Carbonate Antacid (ALKA-SELTZER ANTACID PO) Take 1 tablet by mouth daily as needed (For pain).    . famotidine (PEPCID) 20 MG tablet Take 20 mg by mouth 2 (two) times daily.       Scheduled Meds: . bisacodyl  10 mg Rectal Daily  . Chlorhexidine Gluconate Cloth  6 each Topical Daily  . feeding supplement  237 mL Oral QID  . ferrous sulfate  325 mg Oral BID WC  . heparin injection (subcutaneous)  5,000 Units Subcutaneous Q8H  . influenza vac split quadrivalent PF  0.5 mL Intramuscular Tomorrow-1000  . insulin aspart  0-15 Units Subcutaneous TID WC  . insulin aspart  0-5 Units Subcutaneous QHS  . lactobacillus  1 g Oral TID WC  . lip balm  1 application Topical BID  . nicotine  21 mg Transdermal Daily  . polycarbophil  625 mg Oral BID  . polyethylene glycol  17 g Oral BID  . saccharomyces boulardii  250 mg Oral BID  . sodium chloride flush  10-40 mL Intracatheter Q12H  . sodium chloride flush  5 mL Intracatheter Q8H  . sodium chloride  1 g Oral BID WC   Continuous Infusions: . sodium chloride Stopped (11/25/20 0998)   . methocarbamol (ROBAXIN) IV    . piperacillin-tazobactam (ZOSYN)  IV 3.375 g (11/27/20 1410)   PRN Meds:.acetaminophen, acetaminophen, alum & mag hydroxide-simeth, diphenhydrAMINE, HYDROmorphone (DILAUDID) injection, magic mouthwash, melatonin, menthol-cetylpyridinium, methocarbamol (ROBAXIN) IV, ondansetron **OR** ondansetron (ZOFRAN) IV, phenol, prochlorperazine, simethicone, sodium chloride flush  Allergies: No Known Allergies  Family History  Problem Relation Age of Onset  . Diabetes Sister     Social History:  reports that he has been smoking cigarettes. He has a 15.00 pack-year smoking history. He has never used smokeless tobacco. He reports previous alcohol use. He reports current drug use. Drug: Marijuana.  ROS: A complete review of systems was performed.  All systems are negative except for pertinent findings as noted.  Physical Exam:  Vital signs in last 24 hours: Temp:  [98 F (36.7 C)-98.4 F (36.9 C)] 98.4 F (36.9 C) (02/16 1436) Pulse Rate:  [75-93] 93 (02/16 1436) Resp:  [17] 17 (02/16 1436) BP: (112-134)/(82-89) 134/89 (02/16 1436) SpO2:  [97 %-98 %] 98 % (02/16 1436) Constitutional:  Alert and oriented, No acute distress Cardiovascular:  No JVD Respiratory: Normal respiratory effort GI: Abdomen is soft, nontender, nondistended, no abdominal masses GU: No CVA tenderness Lymphatic: No lymphadenopathy Neurologic: Grossly intact, no focal deficits Psychiatric: Normal mood and affect  Laboratory Data:  Recent Labs    11/25/20 0304 11/26/20 0311 11/27/20 0341  WBC 11.7* 9.9 14.1*  HGB 9.7* 9.8* 9.1*  HCT  31.4* 32.8* 28.9*  PLT 920* 915* 831*    Recent Labs    11/25/20 0304 11/26/20 0311 11/27/20 0341  NA 130* 128* 129*  K 4.5 4.5 4.5  CL 96* 94* 95*  GLUCOSE 110* 108* 102*  BUN 20 19 16   CALCIUM 8.9 9.0 8.9  CREATININE 0.72 0.80 0.78     Results for orders placed or performed during the hospital encounter of 11/15/20 (from the past 24  hour(s))  Glucose, capillary     Status: Abnormal   Collection Time: 11/26/20  9:33 PM  Result Value Ref Range   Glucose-Capillary 129 (H) 70 - 99 mg/dL  Comprehensive metabolic panel     Status: Abnormal   Collection Time: 11/27/20  3:41 AM  Result Value Ref Range   Sodium 129 (L) 135 - 145 mmol/L   Potassium 4.5 3.5 - 5.1 mmol/L   Chloride 95 (L) 98 - 111 mmol/L   CO2 24 22 - 32 mmol/L   Glucose, Bld 102 (H) 70 - 99 mg/dL   BUN 16 6 - 20 mg/dL   Creatinine, Ser 0.78 0.61 - 1.24 mg/dL   Calcium 8.9 8.9 - 10.3 mg/dL   Total Protein 6.2 (L) 6.5 - 8.1 g/dL   Albumin 2.2 (L) 3.5 - 5.0 g/dL   AST 20 15 - 41 U/L   ALT 29 0 - 44 U/L   Alkaline Phosphatase 103 38 - 126 U/L   Total Bilirubin 0.7 0.3 - 1.2 mg/dL   GFR, Estimated >60 >60 mL/min   Anion gap 10 5 - 15  CBC     Status: Abnormal   Collection Time: 11/27/20  3:41 AM  Result Value Ref Range   WBC 14.1 (H) 4.0 - 10.5 K/uL   RBC 3.85 (L) 4.22 - 5.81 MIL/uL   Hemoglobin 9.1 (L) 13.0 - 17.0 g/dL   HCT 28.9 (L) 39.0 - 52.0 %   MCV 75.1 (L) 80.0 - 100.0 fL   MCH 23.6 (L) 26.0 - 34.0 pg   MCHC 31.5 30.0 - 36.0 g/dL   RDW Not Measured 11.5 - 15.5 %   Platelets 831 (H) 150 - 400 K/uL   nRBC 0.0 0.0 - 0.2 %  Glucose, capillary     Status: None   Collection Time: 11/27/20  8:22 AM  Result Value Ref Range   Glucose-Capillary 87 70 - 99 mg/dL  Glucose, capillary     Status: Abnormal   Collection Time: 11/27/20 11:48 AM  Result Value Ref Range   Glucose-Capillary 158 (H) 70 - 99 mg/dL  Glucose, capillary     Status: Abnormal   Collection Time: 11/27/20  5:16 PM  Result Value Ref Range   Glucose-Capillary 105 (H) 70 - 99 mg/dL   Recent Results (from the past 240 hour(s))  Aerobic/Anaerobic Culture (surgical/deep wound)     Status: None   Collection Time: 11/18/20  4:34 PM   Specimen: Abscess  Result Value Ref Range Status   Specimen Description ABSCESS  Final   Special Requests ABDOMEN  Final   Gram Stain   Final    FEW  WBC PRESENT, PREDOMINANTLY PMN MODERATE GRAM NEGATIVE RODS FEW GRAM POSITIVE COCCI IN PAIRS IN CHAINS FEW GRAM POSITIVE RODS    Culture   Final    FEW ENTEROBACTER AEROGENES MODERATE STREPTOCOCCUS GROUP F Beta hemolytic streptococci are predictably susceptible to penicillin and other beta lactams. Susceptibility testing not routinely performed. ABUNDANT BACTEROIDES FRAGILIS BETA LACTAMASE POSITIVE Performed at [facility],  [address], [place], [place] [address]    Report Status 11/21/2020 FINAL  Final   Organism ID, Bacteria ENTEROBACTER AEROGENES  Final      Susceptibility   Enterobacter aerogenes - MIC*    CEFAZOLIN >=64 RESISTANT Resistant     CEFEPIME <=0.12 SENSITIVE Sensitive     CEFTAZIDIME <=1 SENSITIVE Sensitive     CEFTRIAXONE <=0.25 SENSITIVE Sensitive     CIPROFLOXACIN <=0.25 SENSITIVE Sensitive     GENTAMICIN <=1 SENSITIVE Sensitive     IMIPENEM 0.5 SENSITIVE Sensitive     TRIMETH/SULFA <=20 SENSITIVE Sensitive     PIP/TAZO <=4 SENSITIVE Sensitive     * FEW ENTEROBACTER AEROGENES  Aerobic/Anaerobic Culture (surgical/deep wound)     Status: None   Collection Time: 11/20/20  4:22 PM   Specimen: Biopsy; Tissue  Result Value Ref Range Status   Specimen Description BIOPSY LIVER  Final   Special Requests Normal  Final   Gram Stain   Final    RARE WBC PRESENT, PREDOMINANTLY PMN NO ORGANISMS SEEN    Culture   Final    No growth aerobically or anaerobically. Performed at [facility], [place] [address] [place], [place] [address]    Report Status 11/25/2020 FINAL  Final    Renal Function: Recent Labs    11/21/20 0345 11/22/20 0345 11/23/20 0306 11/24/20 0311 11/25/20 0304 11/26/20 0311 11/27/20 0341  CREATININE 0.57* 0.57* 0.67 0.63 0.72 0.80 0.78   Estimated Creatinine Clearance: 107.2 mL/min (by C-G formula based on SCr of 0.78 mg/dL).  Radiologic Imaging: Narrative & Impression  CLINICAL DATA:  Abdominal pain, abscess suspected, history  of colon cancer  EXAM: CT ABDOMEN AND PELVIS WITH CONTRAST  TECHNIQUE: Multidetector CT imaging of the abdomen and pelvis was performed using the standard protocol following bolus administration of intravenous contrast.  CONTRAST:  143mL OMNIPAQUE IOHEXOL 300 MG/ML  SOLN  COMPARISON:  11/15/2020  FINDINGS: Lower chest: No acute abnormality.  Hepatobiliary: Redemonstrated ill-defined hypodense lesion of the subcapsular inferior right lobe of the liver, hepatic segment VI, measuring 3.6 x 2.4 cm (series 10, image 29). No gallstones, gallbladder wall thickening, or biliary dilatation.  Pancreas: Unremarkable. No pancreatic ductal dilatation or surrounding inflammatory changes.  Spleen: Normal in size without significant abnormality.  Adrenals/Urinary Tract: Adrenal glands are unremarkable. Kidneys are normal, without renal calculi, solid lesion, or hydronephrosis. Bladder is unremarkable.  Stomach/Bowel: Stomach is within normal limits. The terminal small bowel is distended, measuring up to 5.0 cm in caliber. 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  Vascular/Lymphatic: Aortic atherosclerosis. Enlarged lymph nodes in the right lower quadrant mesocolon measuring up to 1.5 x 1.1 cm (series 10, image 46).  Reproductive: No mass or other significant abnormality.  Other: No abdominal wall hernia or abnormality. There has been interval placement of a right lower quadrant percutaneous pigtail drainage catheter within a retroperitoneal fluid collection, now almost completely resolved with no discretely visualized fluid remaining (series 10, image 42, 51). Trace ascites in the pelvis.  Musculoskeletal: No acute or significant osseous findings.  IMPRESSION: 1. There has been interval placement of a right lower  quadrant percutaneous pigtail drainage catheter within a retroperitoneal fluid collection, now almost completely resolved with no discretely visualized fluid remaining. 2. There is a redemonstrated large, fungating mass of the cecal base centered around the ileocecal valve, measuring  at least 11.9 x 9.1 x 8.4 cm. Enlarged lymph nodes in the right lower quadrant mesocolon measuring up to 1.5 x 1.1 cm. 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 3. The terminal small bowel is distended, measuring up to 5.0 cm in caliber. Findings are consistent with at partial small bowel obstruction due to obstipating mass. 4. Redemonstrated ill-defined hypodense lesion of the subcapsular inferior right lobe of the liver, hepatic segment VI, which has been biopsied to evaluate for metastatic disease. 5. Trace ascites in the pelvis.  Aortic Atherosclerosis (ICD10-I70.0).   Electronically Signed   By: Eddie Candle M.D.   On: 11/26/2020 08:33     Impression/Recommendation Colon cancer: Dr. Abner Greenspan will be available to place bilateral ureteral stents tomorrow.  I discussed the potential benefits and risks of the procedure, side effects of the proposed treatment, the likelihood of the patient achieving the goals of the procedure, and any potential problems that might occur during the procedure or recuperation. He gives informed consent to proceed.  Eric [name] 11/27/2020, 6:48 PM    Pryor Curia MD   CC: Dr. Ralene Ok

## 2020-11-27 NOTE — Progress Notes (Signed)
Palliative:  HPI:49 y.o.malewith past medical history of tobacco abuseadmitted on2/4/2022with right groin pain and found to have retroperitoneal abscess, bowel perforation, cecal mass concerning for neoplasm with liver lesion present as well.Also with incidental finding of COVID+ infection.  I met today with Eric [name] and sister, Eric [name], at bedside. He is off isolation precautions and Eric [name] and Eric [name] are happy to see each other. There are plans for surgery tomorrow. We discussed the importance of removing section of colon so this does not cause more complications or infections for him. They asked about removing cancer in liver and we discussed the difficulty in doing this and that this will be targeted more by chemotherapy later on. I explained that since the cancer has already spread from colon to liver that there could be more tiny cancer cells that we cannot even detect and that this is the purpose of chemotherapy and why we do not typically surgically resect cancer when it is in multiple locations in the body. They express understanding.   Eric [name] asks about going back to work and I explain that he needs to first complete and recover from surgery but will likely need further treatment from oncology after his recovery. I recommended that he not push going back too soon and we want to ensure that his immune system stays strong and he does not get any further infections.   Even though I believe Eric [name]'s diagnosis is more serious and involved that he currently realizes he and Eric [name] are able to have very open conversation regarding goals of care and end of life. Eric [name] wishes to be full code for now but would not want his life prolonged on machines. He would not want feeding tube to prolong his life. He would not want his life prolonged in the 3 scenarios listed in Advance Directive and designates sister Eric [name] to be his HCPOA. He does not want to suffer at end of life. He would want to donate his organs  if this were an option. He would want to be cremated and placed with his mother's ashes. Hopeful to complete Advance Directive prior to discharge.   All questions/concerns addressed. Emotional support provided.   Exam: Alert, oriented. Good spirits. Cachectic. Breathing regular, unlabored. Abd flat. R flank drain with serous drainage.   Plan: - Goals clear for now - see discussion above.  - Will ask spiritual care to revisit Advance Directive completion in a few days (surgery tomorrow).  - Will need further follow up and work up with oncology.  - Would benefit from outpatient palliative to follow.   [place], NP Palliative Medicine Team Pager [phone] (Please see [email or web address] for schedule) Team Phone [phone]    Greater than 50%  of this time was spent counseling and coordinating care related to the above assessment and plan

## 2020-11-28 ENCOUNTER — Encounter (HOSPITAL_COMMUNITY)
Admission: EM | Disposition: A | Discharge status: Home / Self Care | Payer: Self-pay | Source: Home / Self Care | Attending: Internal Medicine

## 2020-11-28 ENCOUNTER — Encounter (HOSPITAL_COMMUNITY): Payer: Self-pay | Admitting: Internal Medicine

## 2020-11-28 ENCOUNTER — Other Ambulatory Visit: Payer: Self-pay | Admitting: Family Medicine

## 2020-11-28 ENCOUNTER — Inpatient Hospital Stay (HOSPITAL_COMMUNITY): Payer: Self-pay | Admitting: Anesthesiology

## 2020-11-28 HISTORY — PX: CYSTOSCOPY WITH STENT PLACEMENT: SHX5790

## 2020-11-28 HISTORY — PX: PARTIAL COLECTOMY: SHX5273

## 2020-11-28 HISTORY — PX: LAPAROTOMY: SHX154

## 2020-11-28 HISTORY — PX: OSTOMY: SHX5997

## 2020-11-28 LAB — CBC
HCT: 29.5 % — ABNORMAL LOW (ref 39.0–52.0)
Hemoglobin: 9.2 g/dL — ABNORMAL LOW (ref 13.0–17.0)
MCH: 23.9 pg — ABNORMAL LOW (ref 26.0–34.0)
MCHC: 31.2 g/dL (ref 30.0–36.0)
MCV: 76.6 fL — ABNORMAL LOW (ref 80.0–100.0)
Platelets: 773 10*3/uL — ABNORMAL HIGH (ref 150–400)
RBC: 3.85 MIL/uL — ABNORMAL LOW (ref 4.22–5.81)
WBC: 8.2 10*3/uL (ref 4.0–10.5)
nRBC: 0 % (ref 0.0–0.2)

## 2020-11-28 LAB — BASIC METABOLIC PANEL
Anion gap: 9 (ref 5–15)
BUN: 15 mg/dL (ref 6–20)
CO2: 25 mmol/L (ref 22–32)
Calcium: 8.9 mg/dL (ref 8.9–10.3)
Chloride: 97 mmol/L — ABNORMAL LOW (ref 98–111)
Creatinine, Ser: 0.81 mg/dL (ref 0.61–1.24)
GFR, Estimated: >60 mL/min (ref >60)
Glucose, Bld: 109 mg/dL — ABNORMAL HIGH (ref 70–99)
Potassium: 4.6 mmol/L (ref 3.5–5.1)
Sodium: 131 mmol/L — ABNORMAL LOW (ref 135–145)

## 2020-11-28 LAB — GLUCOSE, CAPILLARY
Glucose-Capillary: 109 mg/dL — ABNORMAL HIGH (ref 70–99)
Glucose-Capillary: 132 mg/dL — ABNORMAL HIGH (ref 70–99)
Glucose-Capillary: 135 mg/dL — ABNORMAL HIGH (ref 70–99)

## 2020-11-28 LAB — SURGICAL PCR SCREEN
MRSA, PCR: NEGATIVE
Staphylococcus aureus: NEGATIVE

## 2020-11-28 LAB — TYPE AND SCREEN
ABO/RH(D): A POS
Antibody Screen: NEGATIVE

## 2020-11-28 LAB — MAGNESIUM: Magnesium: 2.3 mg/dL (ref 1.7–2.4)

## 2020-11-28 SURGERY — LAPAROTOMY, EXPLORATORY
Anesthesia: General | Site: Ureter | Laterality: Right

## 2020-11-28 MED ORDER — PROPOFOL 10 MG/ML IV BOLUS
INTRAVENOUS | Status: DC | PRN
  Administered 2020-11-28: 120 mg via INTRAVENOUS
  Administered 2020-11-28 (×2): 30 mg via INTRAVENOUS

## 2020-11-28 MED ORDER — DEXAMETHASONE SODIUM PHOSPHATE 10 MG/ML IJ SOLN
INTRAMUSCULAR | Status: AC
  Filled 2020-11-28: qty 1

## 2020-11-28 MED ORDER — ALBUMIN HUMAN 5 % IV SOLN: INTRAVENOUS | Status: DC | PRN

## 2020-11-28 MED ORDER — ACETAMINOPHEN 500 MG PO TABS
ORAL_TABLET | ORAL | Status: AC
  Administered 2020-11-28: 1000 mg via ORAL
  Filled 2020-11-28: qty 2

## 2020-11-28 MED ORDER — CELECOXIB 200 MG PO CAPS
ORAL_CAPSULE | ORAL | Status: AC
  Administered 2020-11-28: 200 mg via ORAL
  Filled 2020-11-28: qty 1

## 2020-11-28 MED ORDER — FENTANYL CITRATE (PF) 250 MCG/5ML IJ SOLN
INTRAMUSCULAR | Status: DC | PRN
  Administered 2020-11-28 (×2): 25 ug via INTRAVENOUS
  Administered 2020-11-28 (×3): 50 ug via INTRAVENOUS
  Administered 2020-11-28: 100 ug via INTRAVENOUS
  Administered 2020-11-28: 25 ug via INTRAVENOUS
  Administered 2020-11-28 (×3): 50 ug via INTRAVENOUS

## 2020-11-28 MED ORDER — HYDROMORPHONE HCL 1 MG/ML IJ SOLN
0.2500 mg | INTRAMUSCULAR | Status: DC | PRN
  Administered 2020-11-28 (×4): 0.5 mg via INTRAVENOUS

## 2020-11-28 MED ORDER — LIDOCAINE 2% (20 MG/ML) 5 ML SYRINGE
INTRAMUSCULAR | Status: AC
  Filled 2020-11-28: qty 5

## 2020-11-28 MED ORDER — SODIUM CHLORIDE 0.9 % IR SOLN
Status: DC | PRN
  Administered 2020-11-28: 3000 mL

## 2020-11-28 MED ORDER — HYDROMORPHONE HCL 1 MG/ML IJ SOLN
0.5000 mg | INTRAMUSCULAR | Status: DC | PRN
  Administered 2020-11-28: 0.5 mg via INTRAVENOUS
  Filled 2020-11-28: qty 1

## 2020-11-28 MED ORDER — LACTATED RINGERS IV SOLN: INTRAVENOUS | Status: DC | PRN

## 2020-11-28 MED ORDER — PHENYLEPHRINE HCL-NACL 10-0.9 MG/250ML-% IV SOLN
INTRAVENOUS | Status: DC | PRN
  Administered 2020-11-28: 20 ug/min via INTRAVENOUS

## 2020-11-28 MED ORDER — FENTANYL CITRATE (PF) 250 MCG/5ML IJ SOLN
INTRAMUSCULAR | Status: AC
  Filled 2020-11-28: qty 5

## 2020-11-28 MED ORDER — DEXAMETHASONE SODIUM PHOSPHATE 10 MG/ML IJ SOLN
INTRAMUSCULAR | Status: DC | PRN
  Administered 2020-11-28: 5 mg via INTRAVENOUS

## 2020-11-28 MED ORDER — METHOCARBAMOL 1000 MG/10ML IJ SOLN
1000.0000 mg | Freq: Three times a day (TID) | INTRAVENOUS | Status: AC
  Administered 2020-11-28 – 2020-12-01 (×10): 1000 mg via INTRAVENOUS
  Filled 2020-11-28 (×11): qty 10

## 2020-11-28 MED ORDER — SCOPOLAMINE 1 MG/3DAYS TD PT72
MEDICATED_PATCH | TRANSDERMAL | Status: AC
  Administered 2020-11-28: 1.5 mg via TRANSDERMAL
  Filled 2020-11-28: qty 1

## 2020-11-28 MED ORDER — ONDANSETRON HCL 4 MG/2ML IJ SOLN: 4.0000 mg | Freq: Four times a day (QID) | INTRAMUSCULAR | Status: DC | PRN

## 2020-11-28 MED ORDER — ROCURONIUM BROMIDE 10 MG/ML (PF) SYRINGE
PREFILLED_SYRINGE | INTRAVENOUS | Status: AC
  Filled 2020-11-28: qty 10

## 2020-11-28 MED ORDER — SUCCINYLCHOLINE CHLORIDE 200 MG/10ML IV SOSY
PREFILLED_SYRINGE | INTRAVENOUS | Status: AC
  Filled 2020-11-28: qty 10

## 2020-11-28 MED ORDER — ACETAMINOPHEN 325 MG PO TABS
650.0000 mg | ORAL_TABLET | Freq: Four times a day (QID) | ORAL | Status: DC
  Administered 2020-11-28 – 2020-11-30 (×8): 650 mg via ORAL
  Filled 2020-11-28 (×8): qty 2

## 2020-11-28 MED ORDER — DIPHENHYDRAMINE HCL 50 MG/ML IJ SOLN: 12.5000 mg | Freq: Four times a day (QID) | INTRAMUSCULAR | Status: DC | PRN

## 2020-11-28 MED ORDER — ONDANSETRON HCL 4 MG/2ML IJ SOLN
INTRAMUSCULAR | Status: DC | PRN
  Administered 2020-11-28: 4 mg via INTRAVENOUS

## 2020-11-28 MED ORDER — PROPOFOL 10 MG/ML IV BOLUS
INTRAVENOUS | Status: AC
  Filled 2020-11-28: qty 20

## 2020-11-28 MED ORDER — SCOPOLAMINE 1 MG/3DAYS TD PT72: 1.0000 | MEDICATED_PATCH | TRANSDERMAL | Status: DC

## 2020-11-28 MED ORDER — OXYCODONE HCL 5 MG PO TABS: 5.0000 mg | ORAL_TABLET | Freq: Once | ORAL | Status: DC | PRN

## 2020-11-28 MED ORDER — MIDAZOLAM HCL 2 MG/2ML IJ SOLN
INTRAMUSCULAR | Status: AC
  Filled 2020-11-28: qty 2

## 2020-11-28 MED ORDER — NALOXONE HCL 0.4 MG/ML IJ SOLN: 0.4000 mg | INTRAMUSCULAR | Status: DC | PRN

## 2020-11-28 MED ORDER — MIDAZOLAM HCL 2 MG/2ML IJ SOLN
INTRAMUSCULAR | Status: DC | PRN
  Administered 2020-11-28: 2 mg via INTRAVENOUS

## 2020-11-28 MED ORDER — HYDROMORPHONE HCL 1 MG/ML IJ SOLN
INTRAMUSCULAR | Status: AC
  Filled 2020-11-28: qty 1

## 2020-11-28 MED ORDER — PHENYLEPHRINE 40 MCG/ML (10ML) SYRINGE FOR IV PUSH (FOR BLOOD PRESSURE SUPPORT)
PREFILLED_SYRINGE | INTRAVENOUS | Status: AC
  Filled 2020-11-28: qty 10

## 2020-11-28 MED ORDER — HYDROMORPHONE HCL 1 MG/ML IJ SOLN
0.5000 mg | Freq: Once | INTRAMUSCULAR | Status: AC
  Administered 2020-11-28: 0.5 mg via INTRAVENOUS

## 2020-11-28 MED ORDER — SUGAMMADEX SODIUM 200 MG/2ML IV SOLN
INTRAVENOUS | Status: DC | PRN
  Administered 2020-11-28: 200 mg via INTRAVENOUS

## 2020-11-28 MED ORDER — ACETAMINOPHEN 500 MG PO TABS: 1000.0000 mg | ORAL_TABLET | Freq: Once | ORAL | Status: AC

## 2020-11-28 MED ORDER — HEPARIN SODIUM (PORCINE) 5000 UNIT/ML IJ SOLN
5000.0000 [IU] | Freq: Three times a day (TID) | INTRAMUSCULAR | Status: DC
  Administered 2020-11-29 – 2020-12-15 (×47): 5000 [IU] via SUBCUTANEOUS
  Filled 2020-11-28 (×49): qty 1

## 2020-11-28 MED ORDER — CELECOXIB 200 MG PO CAPS: 200.0000 mg | ORAL_CAPSULE | Freq: Once | ORAL | Status: AC

## 2020-11-28 MED ORDER — SODIUM CHLORIDE 0.9% FLUSH: 9.0000 mL | INTRAVENOUS | Status: DC | PRN

## 2020-11-28 MED ORDER — HYDROMORPHONE HCL 1 MG/ML IJ SOLN
0.5000 mg | Freq: Once | INTRAMUSCULAR | Status: AC
Start: 2020-11-28 — End: 2020-11-28
  Administered 2020-11-28: 0.5 mg via INTRAVENOUS

## 2020-11-28 MED ORDER — 0.9 % SODIUM CHLORIDE (POUR BTL) OPTIME
TOPICAL | Status: DC | PRN
  Administered 2020-11-28 (×2): 1000 mL

## 2020-11-28 MED ORDER — SUCCINYLCHOLINE CHLORIDE 200 MG/10ML IV SOSY
PREFILLED_SYRINGE | INTRAVENOUS | Status: DC | PRN
  Administered 2020-11-28: 140 mg via INTRAVENOUS

## 2020-11-28 MED ORDER — OXYCODONE HCL 5 MG/5ML PO SOLN: 5.0000 mg | Freq: Once | ORAL | Status: DC | PRN

## 2020-11-28 MED ORDER — PHENYLEPHRINE 40 MCG/ML (10ML) SYRINGE FOR IV PUSH (FOR BLOOD PRESSURE SUPPORT)
PREFILLED_SYRINGE | INTRAVENOUS | Status: DC | PRN
  Administered 2020-11-28: 80 ug via INTRAVENOUS

## 2020-11-28 MED ORDER — DIPHENHYDRAMINE HCL 12.5 MG/5ML PO ELIX: 12.5000 mg | ORAL_SOLUTION | Freq: Four times a day (QID) | ORAL | Status: DC | PRN

## 2020-11-28 MED ORDER — LIDOCAINE 2% (20 MG/ML) 5 ML SYRINGE
INTRAMUSCULAR | Status: DC | PRN
  Administered 2020-11-28: 100 mg via INTRAVENOUS

## 2020-11-28 MED ORDER — ONDANSETRON HCL 4 MG/2ML IJ SOLN
INTRAMUSCULAR | Status: AC
  Filled 2020-11-28: qty 2

## 2020-11-28 MED ORDER — ROCURONIUM BROMIDE 10 MG/ML (PF) SYRINGE
PREFILLED_SYRINGE | INTRAVENOUS | Status: DC | PRN
  Administered 2020-11-28: 20 mg via INTRAVENOUS
  Administered 2020-11-28: 30 mg via INTRAVENOUS
  Administered 2020-11-28: 50 mg via INTRAVENOUS

## 2020-11-28 MED ORDER — MORPHINE SULFATE 1 MG/ML IV SOLN PCA
INTRAVENOUS | Status: DC
Start: 2020-11-28 — End: 2020-11-30
  Administered 2020-11-28: 16.5 mg via INTRAVENOUS
  Administered 2020-11-28 (×2): 1.5 mg via INTRAVENOUS
  Administered 2020-11-29: 9 mg via INTRAVENOUS
  Administered 2020-11-29: 7.5 mg via INTRAVENOUS
  Administered 2020-11-29: 4.5 mg via INTRAVENOUS
  Administered 2020-11-29: 6 mg via INTRAVENOUS
  Administered 2020-11-29: 4.5 mg via INTRAVENOUS
  Administered 2020-11-30: 9 mg via INTRAVENOUS
  Administered 2020-11-30: 4.5 mg via INTRAVENOUS
  Administered 2020-11-30: 13.5 mg via INTRAVENOUS
  Filled 2020-11-28 (×6): qty 30

## 2020-11-28 SURGICAL SUPPLY — 62 items
APL PRP STRL LF DISP 70% ISPRP (MISCELLANEOUS) ×3
BLADE CLIPPER SURG (BLADE) IMPLANT
CANISTER SUCT 3000ML PPV (MISCELLANEOUS) ×4 IMPLANT
CATH INTERMIT  6FR 70CM (CATHETERS) ×1 IMPLANT
CATH URET 5FR 28IN OPEN ENDED (CATHETERS) ×1 IMPLANT
CHLORAPREP W/TINT 26 (MISCELLANEOUS) ×4 IMPLANT
COVER SURGICAL LIGHT HANDLE (MISCELLANEOUS) ×4 IMPLANT
COVER WAND RF STERILE (DRAPES) ×4 IMPLANT
DRAIN CHANNEL 19F RND (DRAIN) ×1 IMPLANT
DRAPE LAPAROSCOPIC ABDOMINAL (DRAPES) ×4 IMPLANT
DRAPE WARM FLUID 44X44 (DRAPES) ×4 IMPLANT
DRSG OPSITE POSTOP 4X10 (GAUZE/BANDAGES/DRESSINGS) ×1 IMPLANT
DRSG OPSITE POSTOP 4X8 (GAUZE/BANDAGES/DRESSINGS) IMPLANT
ELECT BLADE 6.5 EXT (BLADE) IMPLANT
ELECT CAUTERY BLADE 6.4 (BLADE) ×4 IMPLANT
ELECT REM PT RETURN 9FT ADLT (ELECTROSURGICAL) ×4
ELECTRODE REM PT RTRN 9FT ADLT (ELECTROSURGICAL) ×3 IMPLANT
EVACUATOR SILICONE 100CC (DRAIN) ×1 IMPLANT
GAUZE SPONGE 4X4 12PLY STRL (GAUZE/BANDAGES/DRESSINGS) ×1 IMPLANT
GLOVE BIO SURGEON STRL SZ7.5 (GLOVE) ×8 IMPLANT
GLOVE SRG 8 PF TXTR STRL LF DI (GLOVE) ×6 IMPLANT
GLOVE SURG UNDER POLY LF SZ8 (GLOVE) ×8
GOWN STRL REUS W/ TWL LRG LVL3 (GOWN DISPOSABLE) ×12 IMPLANT
GOWN STRL REUS W/ TWL XL LVL3 (GOWN DISPOSABLE) ×6 IMPLANT
GOWN STRL REUS W/TWL LRG LVL3 (GOWN DISPOSABLE) ×16
GOWN STRL REUS W/TWL XL LVL3 (GOWN DISPOSABLE) ×8
GUIDEWIRE STR DUAL SENSOR (WIRE) ×1 IMPLANT
HANDLE SUCTION POOLE (INSTRUMENTS) ×3 IMPLANT
KIT BASIN OR (CUSTOM PROCEDURE TRAY) ×4 IMPLANT
KIT OSTOMY DRAINABLE 2.75 STR (WOUND CARE) ×1 IMPLANT
KIT SIGMOIDOSCOPE (SET/KITS/TRAYS/PACK) IMPLANT
KIT TURNOVER KIT B (KITS) ×4 IMPLANT
LIGASURE IMPACT 36 18CM CVD LR (INSTRUMENTS) IMPLANT
NS IRRIG 1000ML POUR BTL (IV SOLUTION) ×8 IMPLANT
PACK COLON (CUSTOM PROCEDURE TRAY) ×4 IMPLANT
PACK GENERAL/GYN (CUSTOM PROCEDURE TRAY) ×4 IMPLANT
PAD ARMBOARD 7.5X6 YLW CONV (MISCELLANEOUS) ×4 IMPLANT
PENCIL SMOKE EVACUATOR (MISCELLANEOUS) ×4 IMPLANT
RELOAD PROXIMATE 75MM BLUE (ENDOMECHANICALS) ×4 IMPLANT
RELOAD STAPLE 75 3.8 BLU REG (ENDOMECHANICALS) IMPLANT
SPECIMEN JAR LARGE (MISCELLANEOUS) IMPLANT
SPONGE LAP 18X18 RF (DISPOSABLE) IMPLANT
STAPLER PROXIMATE 75MM BLUE (STAPLE) ×1 IMPLANT
STAPLER VISISTAT 35W (STAPLE) ×4 IMPLANT
SUCTION POOLE HANDLE (INSTRUMENTS) ×4
SURGILUBE 2OZ TUBE FLIPTOP (MISCELLANEOUS) IMPLANT
SUT ETHILON 2 0 FS 18 (SUTURE) ×1 IMPLANT
SUT PDS AB 1 TP1 54 (SUTURE) ×10 IMPLANT
SUT PDS AB 1 TP1 96 (SUTURE) ×8 IMPLANT
SUT PROLENE 2 0 CT2 30 (SUTURE) IMPLANT
SUT PROLENE 2 0 KS (SUTURE) IMPLANT
SUT SILK 0 TIES 10X30 (SUTURE) ×1 IMPLANT
SUT SILK 2 0 SH CR/8 (SUTURE) ×4 IMPLANT
SUT SILK 2 0 TIES 10X30 (SUTURE) ×4 IMPLANT
SUT SILK 3 0 SH CR/8 (SUTURE) ×4 IMPLANT
SUT SILK 3 0 TIES 10X30 (SUTURE) ×4 IMPLANT
SUT VIC AB 3-0 SH 18 (SUTURE) IMPLANT
TAPE CLOTH SURG 6X10 WHT LF (GAUZE/BANDAGES/DRESSINGS) ×1 IMPLANT
TOWEL GREEN STERILE (TOWEL DISPOSABLE) ×4 IMPLANT
TRAY FOLEY MTR SLVR 16FR STAT (SET/KITS/TRAYS/PACK) ×4 IMPLANT
TUBE CONNECTING 12X1/4 (SUCTIONS) ×8 IMPLANT
YANKAUER SUCT BULB TIP NO VENT (SUCTIONS) IMPLANT

## 2020-11-28 NOTE — Progress Notes (Signed)
Day of Surgery Subjective: Pain controlled. Ready for surgery.  Objective: Vital signs in last 24 hours: Temp:  [97.9 F (36.6 C)-98.4 F (36.9 C)] 98.1 F (36.7 C) (02/17 0417) Pulse Rate:  [69-93] 69 (02/17 0417) Resp:  [17-19] 19 (02/17 0417) BP: (112-134)/(83-89) 116/85 (02/17 0417) SpO2:  [97 %-98 %] 97 % (02/17 0417)  Intake/Output from previous day: 02/16 0701 - 02/17 0700 In: 547.7 [P.O.:240; I.V.:30.5; IV Piggyback:277.2] Out: 805 [Urine:770; Drains:35] Intake/Output this shift: Total I/O In: 290 [P.O.:240; IV Piggyback:50] Out: 435 [Urine:400; Drains:35]  Physical Exam:  General: Alert and oriented CV: RRR Lungs: Clear Abdomen: Soft, ND, NT Ext: NT, No erythema  Lab Results: Recent Labs    11/26/20 0311 11/27/20 0341 11/28/20 0338  HGB 9.8* 9.1* 9.2*  HCT 32.8* 28.9* 29.5*   BMET Recent Labs    11/27/20 0341 11/28/20 0338  NA 129* 131*  K 4.5 4.6  CL 95* 97*  CO2 24 25  GLUCOSE 102* 109*  BUN 16 15  CREATININE 0.78 0.81  CALCIUM 8.9 8.9     Studies/Results: No results found.  Assessment/Plan: 1. Colon mass with perforation  -To OR for cysto, bilateral open ended externalized ureteral catheter placement  -The risks, benefits and alternatives of cystoscopy with bilateral open ended externalized stent placement was discussed with the patient.  Risks include, but are not limited to: bleeding, urinary tract infection, ureteral injury, ureteral stricture disease, chronic pain, urinary symptoms, bladder injury, stent migration, the need for nephrostomy tube placement, MI, CVA, DVT, PE and the inherent risks with general anesthesia.  The patient voices understanding and wishes to proceed.    LOS: 13 days   Matt R. Kento Gossman MD 11/28/2020, 6:53 AM [facility]  Pager: [phone]

## 2020-11-28 NOTE — Progress Notes (Signed)
PT Cancellation Note  Patient Details Name: Eric [name] MRN: [id number] DOB: July 20, 1971   Cancelled Treatment:    Reason Eval/Treat Not Completed: Medical issues which prohibited therapy Pt s/p exp lap and colostomy earlier today.  Spoke with RN who agrees better to follow up at later date.  Abran Richard, PT Acute Rehab Services Pager [phone] [facility] [place] 11/28/2020, 4:35 PM

## 2020-11-28 NOTE — Progress Notes (Addendum)
PROGRESS NOTE    Eric [name]  IOX:[id number] DOB: 1970-12-20 DOA: 11/15/2020 PCP: [facility]    Chief Complaint  Patient presents with  . Groin Pain    Brief Narrative:  Eric [name] is a 50 y.o. male no known medical history. Patient presented secondary to right groin pain and found to have a retroperitoneal abscess/bowel perforation/cecal mass concerning for neoplasm. Incidental finding of COVID-19 positive status. General surgery consulted , with poor oral intake, protein calorie malnutrition, started on TPN, had IR drain of his retroperitoneal abscess, improving on IV Zosyn, biopsy of liver mass showed metastasis of primary colon adenocarcinoma.  Subjective:  Patient is seen after return from the OR  Report being "sore", no fever, no n/v Sister at bedside  Assessment & Plan:   Principal Problem:   Retroperitoneal abscess ([place]) Active Problems:   Colonic mass   Iron deficiency anemia due to chronic blood loss   Sepsis ([place])   COVID-19 virus infection   Thrombocytosis   Hyponatremia   Protein-calorie malnutrition, severe (H[facility])   Liver mass, right lobe   Right groin pain   Ambulates with cane   Tobacco abuse   Abdominal pain, RLQ (right lower quadrant)   Fatigue   Symptomatic anemia   Malnutrition of moderate degree   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 ([place])   Sepsis present on admission due to perforated colon adenocarcinoma with retroperitoneal abscess -Status post IR drain, abscess culture positive for Enterobacter, he is currently on Zosyn -s/p  URo stent placement and ex lap with right colectomy , ileostomy creation on February 17 -WBC 21.4 on presentation, WBC today 8.2 -Blood culture no growth -Plan per general surgery/urology  Blood loss anemia, likely from chronic GI bleed from colon cancer -Hemoglobin 5.4 on presentation -Received PRBC transfusion x4 unit -Hemoglobin today 9  Thrombocytosis Likely reactive Platelet  1165 on presentation, platelet today 773  Hyponatremia -Has been hyponatremia since admission , sodium 129 on presentation ,sodium nadir at 124, today 131 -He does not appear symptomatic, clinically he is dry, however urine sodium is 148 -continue  sodium tablet 3 times daily   COVID-19 infection - Incidental. Asymptomatic. On room air.  Treated with 3 days of remdesivir -He can come off isolation on February 15  Nutritional Assessment:  The patient's BMI is: Body mass index is 20.75 kg/m.Marland Kitchen  Seen by dietician.  I agree with the assessment and plan as outlined below:  Nutrition Status: Nutrition Problem: Moderate Malnutrition Etiology:  (illness) Signs/Symptoms: severe muscle depletion,moderate fat depletion,moderate muscle depletion Interventions: Ensure Enlive (each supplement provides 350kcal and 20 grams of protein),TPN  Initially on TPN, off TPN February 13, currently on clear liquid, may need TPN again postop, plan per general surgery  Tobacco abuse -Smoking cessation education provided, continue nicotine patch    Unresulted Labs (From admission, onward)          Start     Ordered   12/02/20 0000  Prealbumin  Weekly,   R     Question:  Specimen collection method  Answer:  IV Team=IV Team collect   11/25/20 1234   11/29/20 0500  CBC  Tomorrow morning,   R       Question:  Specimen collection method  Answer:  IV Team=IV Team collect   11/28/20 1528   11/29/20 2683  Basic metabolic panel  Tomorrow morning,   R       Question:  Specimen collection method  Answer:  IV Team=IV  Team collect   11/28/20 1528             DVT prophylaxis: heparin injection 5,000 Units Start: 11/29/20 0800 SCDs Start: 11/15/20 2135   Code Status: Full Family Communication: Sister at bedside Disposition:   Status is: Inpatient  Dispo: The patient is from: Home              Anticipated d/c is to: To be determined              Anticipated d/c date is:               Patient  currently not ready to discharge  Consultants:   General surgery  Palliative care  Oncology Dr. Benay Spice  Interventional radiology  Procedures:    PICC line placement February 7  Retroperitoneal abscess drain placement on February 7 s/p  URo stent placement and ex lap with right colectomy , ileostomy creation on February 17   Antimicrobials:   Anti-infectives (From admission, onward)   Start     Dose/Rate Route Frequency Ordered Stop   11/16/20 1000  remdesivir 100 mg in sodium chloride 0.9 % 100 mL IVPB       "Followed by" Linked Group Details   100 mg 200 mL/hr over 30 Minutes Intravenous Daily 11/15/20 2135 11/17/20 1002   11/15/20 2230  piperacillin-tazobactam (ZOSYN) IVPB 3.375 g        3.375 g 12.5 mL/hr over 240 Minutes Intravenous Every 8 hours 11/15/20 2144     11/15/20 2134  remdesivir 200 mg in sodium chloride 0.9% 250 mL IVPB       "Followed by" Linked Group Details   200 mg 580 mL/hr over 30 Minutes Intravenous Once 11/15/20 2135 11/16/20 0029   11/15/20 1700  piperacillin-tazobactam (ZOSYN) IVPB 3.375 g        3.375 g 100 mL/hr over 30 Minutes Intravenous  Once 11/15/20 1657 11/15/20 1826          Objective: Vitals:   11/28/20 1325 11/28/20 1330 11/28/20 1340 11/28/20 1407  BP: (!) 137/93  (!) 144/91 (!) 141/91  Pulse: 76 82 79 83  Resp: 18 (!) 21 (!) 22 19  Temp:   98.5 F (36.9 C) 97.6 F (36.4 C)  TempSrc:    Oral  SpO2: 98% 98% 100% 99%  Weight:      Height:        Intake/Output Summary (Last 24 hours) at 11/28/2020 1528 Last data filed at 11/28/2020 1436 Gross per 24 hour  Intake 2387.46 ml  Output 910 ml  Net 1477.46 ml   Filed Weights   11/15/20 2000  Weight: 69.4 kg    Examination:  General exam: Thin ,calm, NAD Respiratory system: Clear to auscultation. Respiratory effort normal. Cardiovascular system: S1 & S2 heard, RRR. No JVD, no murmur, No pedal edema. Gastrointestinal system: Postop changes, dressing intact,  decreased bowel sounds, positive drains, + ieostomy Central nervous system: Alert and oriented. No focal neurological deficits. Extremities: Symmetric 5 x 5 power. Skin: No rashes, lesions or ulcers Psychiatry: Judgement and insight appear normal. Mood & affect appropriate.     Data Reviewed: I have personally reviewed following labs and imaging studies  CBC: Recent Labs  Lab 11/24/20 0311 11/25/20 0304 11/26/20 0311 11/27/20 0341 11/28/20 0338  WBC 13.4* 11.7* 9.9 14.1* 8.2  HGB 9.1* 9.7* 9.8* 9.1* 9.2*  HCT 29.5* 31.4* 32.8* 28.9* 29.5*  MCV 75.3* 74.6* 76.1* 75.1* 76.6*  PLT 898* 920* 915* 831* 773*  Basic Metabolic Panel: Recent Labs  Lab 11/22/20 0345 11/23/20 0306 11/24/20 0311 11/25/20 0304 11/26/20 0311 11/27/20 0341 11/28/20 0338  NA 131*   < > 131* 130* 128* 129* 131*  K 4.5   < > 4.5 4.5 4.5 4.5 4.6  CL 101   < > 99 96* 94* 95* 97*  CO2 22   < > 23 25 25 24 25   GLUCOSE 134*   < > 142* 110* 108* 102* 109*  BUN 16   < > 20 20 19 16 15   CREATININE 0.57*   < > 0.63 0.72 0.80 0.78 0.81  CALCIUM 8.3*   < > 8.5* 8.9 9.0 8.9 8.9  MG 2.2  --  2.2 2.4  --   --  2.3  PHOS 4.2  --  4.8* 5.7*  --   --   --    < > = values in this interval not displayed.    GFR: Estimated Creatinine Clearance: 105.9 mL/min (by C-G formula based on SCr of 0.81 mg/dL).  Liver Function Tests: Recent Labs  Lab 11/23/20 0306 11/24/20 0311 11/25/20 0304 11/26/20 0311 11/27/20 0341  AST 37 45* 34 25 20  ALT 43 51* 43 38 29  ALKPHOS 109 106 109 126 103  BILITOT 0.4 0.1* 0.6 0.4 0.7  PROT 6.3* 6.4* 6.7 6.4* 6.2*  ALBUMIN 1.9* 2.0* 2.2* 2.2* 2.2*    CBG: Recent Labs  Lab 11/27/20 0822 11/27/20 1148 11/27/20 1716 11/27/20 2105 11/28/20 0419  GLUCAP 87 158* 105* 137* 109*     Recent Results (from the past 240 hour(s))  Aerobic/Anaerobic Culture (surgical/deep wound)     Status: None   Collection Time: 11/18/20  4:34 PM   Specimen: Abscess  Result Value Ref Range  Status   Specimen Description ABSCESS  Final   Special Requests ABDOMEN  Final   Gram Stain   Final    FEW WBC PRESENT, PREDOMINANTLY PMN MODERATE GRAM NEGATIVE RODS FEW GRAM POSITIVE COCCI IN PAIRS IN CHAINS FEW GRAM POSITIVE RODS    Culture   Final    FEW ENTEROBACTER AEROGENES MODERATE STREPTOCOCCUS GROUP F Beta hemolytic streptococci are predictably susceptible to penicillin and other beta lactams. Susceptibility testing not routinely performed. ABUNDANT BACTEROIDES FRAGILIS BETA LACTAMASE POSITIVE Performed at [facility], [place] [address] [place], [place] [address]    Report Status 11/21/2020 FINAL  Final   Organism ID, Bacteria ENTEROBACTER AEROGENES  Final      Susceptibility   Enterobacter aerogenes - MIC*    CEFAZOLIN >=64 RESISTANT Resistant     CEFEPIME <=0.12 SENSITIVE Sensitive     CEFTAZIDIME <=1 SENSITIVE Sensitive     CEFTRIAXONE <=0.25 SENSITIVE Sensitive     CIPROFLOXACIN <=0.25 SENSITIVE Sensitive     GENTAMICIN <=1 SENSITIVE Sensitive     IMIPENEM 0.5 SENSITIVE Sensitive     TRIMETH/SULFA <=20 SENSITIVE Sensitive     PIP/TAZO <=4 SENSITIVE Sensitive     * FEW ENTEROBACTER AEROGENES  Aerobic/Anaerobic Culture (surgical/deep wound)     Status: None   Collection Time: 11/20/20  4:22 PM   Specimen: Biopsy; Tissue  Result Value Ref Range Status   Specimen Description BIOPSY LIVER  Final   Special Requests Normal  Final   Gram Stain   Final    RARE WBC PRESENT, PREDOMINANTLY PMN NO ORGANISMS SEEN    Culture   Final    No growth aerobically or anaerobically. Performed at [facility], [place],  [place] [address]    Report Status 11/25/2020 FINAL  Final  Surgical pcr screen     Status: None   Collection Time: 11/27/20 12:36 AM   Specimen: Nasal Mucosa; Nasal Swab  Result Value Ref Range Status   MRSA, PCR NEGATIVE NEGATIVE Final   Staphylococcus aureus NEGATIVE NEGATIVE Final    Comment: (NOTE) The Xpert SA Assay (FDA  approved for NASAL specimens in patients 42 years of age and older), is one component of a comprehensive surveillance program. It is not intended to diagnose infection nor to guide or monitor treatment. Performed at [facility], [place] [address], [place], [place] [address]          Radiology Studies: No results found.      Scheduled Meds: . acetaminophen  650 mg Oral Q6H  . bisacodyl  10 mg Rectal Daily  . Chlorhexidine Gluconate Cloth  6 each Topical Daily  . feeding supplement  237 mL Oral QID  . ferrous sulfate  325 mg Oral BID WC  . [START ON 11/29/2020] heparin injection (subcutaneous)  5,000 Units Subcutaneous Q8H  . HYDROmorphone      . HYDROmorphone      . HYDROmorphone      . influenza vac split quadrivalent PF  0.5 mL Intramuscular Tomorrow-1000  . insulin aspart  0-15 Units Subcutaneous TID WC  . insulin aspart  0-5 Units Subcutaneous QHS  . lactobacillus  1 g Oral TID WC  . lip balm  1 application Topical BID  . morphine   Intravenous Q4H  . nicotine  21 mg Transdermal Daily  . polycarbophil  625 mg Oral BID  . saccharomyces boulardii  250 mg Oral BID  . sodium chloride flush  10-40 mL Intracatheter Q12H  . sodium chloride flush  5 mL Intracatheter Q8H  . sodium chloride  1 g Oral BID WC   Continuous Infusions: . sodium chloride Stopped (11/25/20 7948)  . methocarbamol (ROBAXIN) IV    . piperacillin-tazobactam (ZOSYN)  IV 3.375 g (11/28/20 0712)     LOS: 13 days   Time spent: 65mins Greater than 50% of this time was spent in counseling, explanation of diagnosis, planning of further management, and coordination of care.   Voice Recognition Viviann Spare dictation system was used to create this note, attempts have been made to correct errors. Please contact the author with questions and/or clarifications.   Florencia Reasons, MD PhD FACP [facility]  Available via Epic secure chat 7am-7pm for nonurgent issues Please page for urgent issues To page  the attending provider between 7A-7P or the covering provider during after hours 7P-7A, please log into the web site [email or web address] and access using universal [place] password for that web site. If you do not have the password, please call the hospital operator.    11/28/2020, 3:28 PM

## 2020-11-28 NOTE — Anesthesia Procedure Notes (Signed)
Procedure Name: Intubation Date/Time: 11/28/2020 8:37 AM Performed by: Dorthea [place], CRNA Pre-anesthesia Checklist: Patient identified, Emergency Drugs available, Suction available and Patient being monitored Patient Re-evaluated:Patient Re-evaluated prior to induction Oxygen Delivery Method: Circle system utilized Preoxygenation: Pre-oxygenation with 100% oxygen Induction Type: IV induction, Rapid sequence and Cricoid Pressure applied Laryngoscope Size: Mac and 4 Grade View: Grade I Tube type: Oral Tube size: 7.5 mm Number of attempts: 1 Airway Equipment and Method: Stylet and Oral airway Placement Confirmation: ETT inserted through vocal cords under direct vision,  positive ETCO2 and breath sounds checked- equal and bilateral Secured at: 23 cm Tube secured with: Tape Dental Injury: Teeth and Oropharynx as per pre-operative assessment

## 2020-11-28 NOTE — Progress Notes (Signed)
Day of Surgery   Subjective/Chief Complaint: Pt with no changes   Objective: Vital signs in last 24 hours: Temp:  [97.9 F (36.6 C)-98.4 F (36.9 C)] 98.1 F (36.7 C) (02/17 0417) Pulse Rate:  [69-93] 69 (02/17 0417) Resp:  [17-19] 19 (02/17 0417) BP: (112-134)/(83-89) 116/85 (02/17 0417) SpO2:  [97 %-98 %] 97 % (02/17 0417) Last BM Date: 11/26/20  Intake/Output from previous day: 02/16 0701 - 02/17 0700 In: 547.7 [P.O.:240; I.V.:30.5; IV Piggyback:277.2] Out: 805 [Urine:770; Drains:35] Intake/Output this shift: No intake/output data recorded.  PE:  Constitutional: No acute distress, conversant, appears states age. Eyes: Anicteric sclerae, moist conjunctiva, no lid lag Lungs: Clear to auscultation bilaterally, normal respiratory effort CV: regular rate and rhythm, no murmurs, no peripheral edema, pedal pulses 2+ GI: Soft, RLQ mass, non-tender to palpation,  Skin: No rashes, palpation reveals normal turgor Psychiatric: appropriate judgment and insight, oriented to person, place, and time   Lab Results:  Recent Labs    11/27/20 0341 11/28/20 0338  WBC 14.1* 8.2  HGB 9.1* 9.2*  HCT 28.9* 29.5*  PLT 831* 773*   BMET Recent Labs    11/27/20 0341 11/28/20 0338  NA 129* 131*  K 4.5 4.6  CL 95* 97*  CO2 24 25  GLUCOSE 102* 109*  BUN 16 15  CREATININE 0.78 0.81  CALCIUM 8.9 8.9   Anti-infectives: Anti-infectives (From admission, onward)   Start     Dose/Rate Route Frequency Ordered Stop   11/16/20 1000  remdesivir 100 mg in sodium chloride 0.9 % 100 mL IVPB       "Followed by" Linked Group Details   100 mg 200 mL/hr over 30 Minutes Intravenous Daily 11/15/20 2135 11/17/20 1002   11/15/20 2230  [MAR Hold]  piperacillin-tazobactam (ZOSYN) IVPB 3.375 g        (MAR Hold since Thu 11/28/2020 at 0639.Hold Reason: Transfer to a Procedural area.)   3.375 g 12.5 mL/hr over 240 Minutes Intravenous Every 8 hours 11/15/20 2144     11/15/20 2134  remdesivir 200 mg in  sodium chloride 0.9% 250 mL IVPB       "Followed by" Linked Group Details   200 mg 580 mL/hr over 30 Minutes Intravenous Once 11/15/20 2135 11/16/20 0029   11/15/20 1700  piperacillin-tazobactam (ZOSYN) IVPB 3.375 g        3.375 g 100 mL/hr over 30 Minutes Intravenous  Once 11/15/20 1657 11/15/20 1826      Assessment/Plan: Anemia Hyponatremia Tobacco abuse COVID-19positive  Severe malnutrition - prealbumin 5.8 (2/8), continue TPN  Colonmass with perforationand RTP abscess Stage IV adenocarcinomacolon  to OR for ex lap and bowel resection   Liver metastasis  - x3 noted on MRI, unsure if abscess vs metastatic disease.IR liver biopsy 11/20/2020; -A. LIVER, RIGHT MASS, NEEDLE CORE BIOPSY:  - Adenocarcinoma.  - See comment.  COMMENT:  The morphology suggests metastatic colorectal adenocarcinoma.  Immunohistochemistry will be performed and reported as an addendum.  There is sufficient tissue for additional testing.  Oncology recommendations:1.continue JP drain/antibiotics; 2. Surgical resection primary mass by general surgery when stable; 3.Outpatient genetic testing/no chemotherapy until after surgery  ID -zosyn 2/4>> FEN -full liquids, NPO after MN VTE: SQ heparin Foley -none   LOS: 13 days    Eric [name] 11/28/2020

## 2020-11-28 NOTE — Anesthesia Postprocedure Evaluation (Signed)
Anesthesia Post Note  Patient: Eric [name]  Procedure(s) Performed: EXPLORATORY LAPAROTOMY (N/A Abdomen) RIGHT COLECTOMY (Right Abdomen) ILEOSTOMY CREATION (N/A Abdomen) CYSTOSCOPY WITH STENT PLACEMENT (N/A Ureter)     Patient location during evaluation: PACU Anesthesia Type: General Level of consciousness: sedated Pain management: pain level controlled Vital Signs Assessment: post-procedure vital signs reviewed and stable Respiratory status: spontaneous breathing and respiratory function stable Cardiovascular status: stable Postop Assessment: no apparent nausea or vomiting Anesthetic complications: no   No complications documented.  Last Vitals:  Vitals:   11/28/20 1407 11/28/20 1600  BP: (!) 141/91   Pulse: 83   Resp: 19 18  Temp: 36.4 C   SpO2: 99%     Last Pain:  Vitals:   11/28/20 1600  TempSrc:   PainSc: 5                  Jacaden Forbush DANIEL

## 2020-11-28 NOTE — Transfer of Care (Signed)
Immediate Anesthesia Transfer of Care Note  Patient: STORMY SABOL  Procedure(s) Performed: EXPLORATORY LAPAROTOMY (N/A Abdomen) RIGHT COLECTOMY (Right Abdomen) ILEOSTOMY CREATION (N/A Abdomen) CYSTOSCOPY WITH STENT PLACEMENT (N/A Ureter)  Patient Location: PACU  Anesthesia Type:General  Level of Consciousness: awake and patient cooperative  Airway & Oxygen Therapy: Patient Spontanous Breathing and Patient connected to face mask oxygen  Post-op Assessment: Report given to RN and Post -op Vital signs reviewed and stable  Post vital signs: Reviewed and stable  Last Vitals:  Vitals Value Taken Time  BP 162/98 11/28/20 1110  Temp    Pulse 80 11/28/20 1111  Resp 20 11/28/20 1111  SpO2 100 % 11/28/20 1111  Vitals shown include unvalidated device data.  Last Pain:  Vitals:   11/27/20 2054  TempSrc:   PainSc: Asleep      Patients Stated Pain Goal: 3 (64/40/34 7425)  Complications: No complications documented.

## 2020-11-28 NOTE — Op Note (Signed)
Operative Note  Preoperative diagnosis:  1.  Colon cancer  Postoperative diagnosis: 1.  Colon cancer  Procedure(s): 1.  Cystoscopy 2. Bilateral externalized open ended ureteral catheter placement  Surgeon: Rexene Alberts, MD  Assistants:  None  Anesthesia:  General  Complications:  None  EBL:  Minimal for my portion  Specimens: 1. None for my portion  Drains/Catheters: 1.  5 Fr open ended right ureteral catheter 2. 5 Fr open ended left ureteral catheter  Intraoperative findings:   1. Systemic evaluation of the bladder revealed no suspicious lesions. Normal appearing mucosa without tumors, stones or other pathology.  Indication:  Eric [name] is a 50 y.o. male with a history of stave IV adenocarcinoma of the colon with liver metastasis who developed a perforation from a large colon mass with subsequent retroperitoneal abscess. He is being brought to the OR today for resection of the primary cecal mass by Dr. Rosendo Gros. Urology has been asked to place bilateral ureteral stents to aid in identification of the ureters.  Description of procedure: The patient was identified and surgical site verification was performed prior to obtaining consent.  The patient was brought to the operating suite.  Under adequate general anesthesia the patient was positioned in dorsal lithotomy and prepped and draped.  A preoperative Time Out was performed addressing the anticipated surgical site, procedure, and safety precautions.  The 21Fr cystoscope was inserted into the patient's urethral meatus and advanced to the bladder.   The bladder was inspected with the 30 degree lens.  The ureteral orifices were in their normal anatomic positions.  The bladder showed no trabeculation.  There were no bladder tumors noted. There was some infectious debris present which was irrigated away.  The right orifice was intubated with a sensor wire and a 5 [place] OEC was placed over the wire into the right ureter and  advanced to 25 cm.  The wire was then removed and the same procedure was repeated on the left.  A 5 French OEC was advanced over the sensor wire to 25 cm into the left ureter. The OECs were left in place and the cystoscope was removed.  A 16 F foley catheter was then inserted and the OECs were brought through the hub of the foley using a 14g angiocath and attached to individual drainage bag.  The OECs were secured to the foley catheter using silk ties and the foley/OECs were placed to gravity drainage.  The case was then turned over to Dr. Johney Frame team for the remainder of the procedure.  Plan: The OECs will be removed at the end of the case.  The foley catheter will be managed by the primary team.  [facility]  Pager: [phone]

## 2020-11-28 NOTE — Op Note (Signed)
11/28/2020  10:56 AM  PATIENT:  Eric [name]  50 y.o. male  PRE-OPERATIVE DIAGNOSIS:  ADENOCARCINOMA OF COLON  POST-OPERATIVE DIAGNOSIS:  ADENOCARCINOMA OF COLON  PROCEDURE:  Procedure(s) with comments: EXPLORATORY LAPAROTOMY (N/A) - DOW CASE TO START AT 730AM 4 HOUR CASE RIGHT COLECTOMY (Right) ILEOSTOMY CREATION (N/A) CYSTOSCOPY WITH STENT PLACEMENT (N/A)  SURGEON:  Surgeon(s) and Role: Panel 1:    * Ralene Ok, MD - Primary Panel 2:    * Janith Lima, MD - Primary  PHYSICIAN ASSISTANT: Saverio Danker, RNFA  ANESTHESIA:   local and general  EBL:  100 mL   BLOOD ADMINISTERED:none  DRAINS: (19Fr) Jackson-Pratt drain(s) with closed bulb suction in the RLQ area  LOCAL MEDICATIONS USED:  NONE  SPECIMEN:  Source of Specimen:  Right colon  DISPOSITION OF SPECIMEN:  PATHOLOGY  COUNTS:  YES  TOURNIQUET:  * No tourniquets in log *  DICTATION: .Dragon Dictation Patient is a 50 year old male who was admitted to the hospital secondary to right lower quadrant pain and leg pain.  Patient was found to have a large right a sending tumor.  Patient had poor nutrition and was placed under nutritional therapy.  Patient subsequently was scheduled for right colectomy with end ileostomy to proceed with chemotherapy.  Findings: Patient had a very large adherent right colon mass.  This was adherent to the right retroperitoneum.  The right ureter could easily be palpated via the placed stents, and seemed to be just deep to the posterior portion of the mass.  The duodenum was also free of the mass.  However this did reach up alongside the duodenum.  Patient had a long transverse Hartman's, and a ileostomy.   There was some studding of the liver on the left side as well as an easily seen mass to the right lobe of liver.  There was no peritoneal studding that was seen  Details of procedure: As the patient was consented he was taken back to the OR and placed in supine position with  bilateral SCDs in place.  He underwent general endotracheal intubation.  Patient was and placed in the lithotomy position.  Patient had stents placed by urology.  Please see their operative note.  For my portion of the procedure.  A timeout was called and all facts verified.  Upper midline incision was made and dissection was taken down to the midline fascia.  The fascia was elevated between 2 Kocher's.  The peritoneum was entered bluntly.  At this time the fascia was extended to the length of the skin incision.  At this time it was easily seen and palpated there was a large mass in the right lower quadrant and right colon.  At this time a Bookwalter retractor was in place in the small bowel and the abdominal wall was retracted.  At this time it could easily be seen that there was some studding of the liver on the left side as well as an easily seen mass to the right lobe of liver.  There was no peritoneal studding that was seen.  At this time I was able to bluntly and sharply mobilize the right colon medially by incising the white line of Toldt.  There was some definite adhesions to the retroperitoneum.  It was difficult to assess whether or not this interacted with the duodenum.  The duodenum was kocherized.  This was free of the right colonic mass.  At this time from an anterograde and retrograde fashion were able to mobilize  the right colon.  Was able to feel the right ureter stent deep to the posterior portion of the mass.  At this time the distal ileum was transected via a mesenteric window.  A 75 GIA stapler was used to transect the ileum.  At this time a portion of the transverse colon was chosen.  This was also transected using a 75 GIA stapler.  At this time the mesentery was ligated using a LigaSure device.  The ileocolic artery was doubly ligated using 0 silk ties.  Should be noted that the mesentery was taken down towards the origin of the ileocolic artery.  At this time the abdominal cavity  was irrigated out with sterile saline.  A 19 [place] Blake drain was then placed into the right retroperitoneum via a stab incision in the right flank area.  At this time an area of the right abdominal wall was chosen for the ileostomy.  The skin was excised.  The fascia was incised and a aperture was made for the ostomy.  The ostomy was brought out through the skin layer.  At this time the midline fascia was reapproximated using single-stranded, running PDS x2.  Loose staples were placed on the skin.  At this time we matured the ostomy in a Brooke fashion.  3 oh silks were used in the cardinal directions.  3-0 Vicryl's were used to reapproximate the mucosa to the skin.  The patient tolerated procedure well was taken to the recovery in stable condition.  PLAN OF CARE: Admit to inpatient   PATIENT DISPOSITION:  PACU - hemodynamically stable.   Delay start of Pharmacological VTE agent (>24hrs) due to surgical blood loss or risk of bleeding: yes

## 2020-11-29 ENCOUNTER — Encounter (HOSPITAL_COMMUNITY): Payer: Self-pay | Admitting: General Surgery

## 2020-11-29 LAB — CBC
HCT: 31.1 % — ABNORMAL LOW (ref 39.0–52.0)
Hemoglobin: 9.6 g/dL — ABNORMAL LOW (ref 13.0–17.0)
MCH: 23.5 pg — ABNORMAL LOW (ref 26.0–34.0)
MCHC: 30.9 g/dL (ref 30.0–36.0)
MCV: 76 fL — ABNORMAL LOW (ref 80.0–100.0)
Platelets: 840 10*3/uL — ABNORMAL HIGH (ref 150–400)
RBC: 4.09 MIL/uL — ABNORMAL LOW (ref 4.22–5.81)
WBC: 13.6 10*3/uL — ABNORMAL HIGH (ref 4.0–10.5)
nRBC: 0 % (ref 0.0–0.2)

## 2020-11-29 LAB — GLUCOSE, CAPILLARY
Glucose-Capillary: 124 mg/dL — ABNORMAL HIGH (ref 70–99)
Glucose-Capillary: 132 mg/dL — ABNORMAL HIGH (ref 70–99)
Glucose-Capillary: 137 mg/dL — ABNORMAL HIGH (ref 70–99)
Glucose-Capillary: 141 mg/dL — ABNORMAL HIGH (ref 70–99)

## 2020-11-29 LAB — BASIC METABOLIC PANEL
Anion gap: 10 (ref 5–15)
BUN: 16 mg/dL (ref 6–20)
CO2: 25 mmol/L (ref 22–32)
Calcium: 9.2 mg/dL (ref 8.9–10.3)
Chloride: 97 mmol/L — ABNORMAL LOW (ref 98–111)
Creatinine, Ser: 0.83 mg/dL (ref 0.61–1.24)
GFR, Estimated: >60 mL/min (ref >60)
Glucose, Bld: 126 mg/dL — ABNORMAL HIGH (ref 70–99)
Potassium: 4.6 mmol/L (ref 3.5–5.1)
Sodium: 132 mmol/L — ABNORMAL LOW (ref 135–145)

## 2020-11-29 MED ORDER — ALUM & MAG HYDROXIDE-SIMETH 200-200-20 MG/5ML PO SUSP: 30.0000 mL | Freq: Once | ORAL | Status: DC

## 2020-11-29 MED ORDER — SODIUM CHLORIDE 0.9 % IV SOLN: INTRAVENOUS | Status: DC

## 2020-11-29 MED ORDER — LIDOCAINE 5 % EX PTCH
1.0000 | MEDICATED_PATCH | Freq: Every day | CUTANEOUS | Status: DC
  Administered 2020-11-29 – 2020-12-15 (×17): 1 via TRANSDERMAL
  Filled 2020-11-29 (×18): qty 1

## 2020-11-29 NOTE — Progress Notes (Signed)
This chaplain responded to PMT referral for notarizing the Pt. AD.  The Pt. is awake at the time of the visit requesting rest, acknowledging discomfort in the abdomen area, and heartburn.  The Pt. communicated the importance of notarizing the Pt. AD and assisted the chaplain with phoning his sister.  The chaplain understands from a phone call with the Pt. sister-Lori, Cecille Rubin will visit today between 1:30-2:00pm.  The Pt. desires his sister presence for the signing of the Advance Directive.  This chaplain is available for F/U spiritual care as needed.

## 2020-11-29 NOTE — Progress Notes (Signed)
This chaplain is present with the Pt., Pt. Sister-Laura, notary and witnesses for notarizing of the Pt. Advance Directive: HCPOA and Living Will.  The chaplain completed Advance Directive education with the Pt. and Mickel Baas per Laura's request. The Pt. named Anselmo Rod as his healthcare agent and Alberteen Sam if his healthcare agent is  unable or unwilling  to serve.  The chaplain gave the Pt. the original Advance Directive and two copies.  A copy was scanned into the Pt. EMR.  This chaplain is available for F/U spiritual care as needed.

## 2020-11-29 NOTE — Progress Notes (Signed)
PT Cancellation Note  Patient Details Name: Eric [name] MRN: [id number] DOB: 05-Jan-1971   Cancelled Treatment:    Reason Eval/Treat Not Completed: Pain limiting ability to participate. Patient declining oob activities again this pm. Reports he is hurting too much. Would like to wait until tomorrow. We will return tomorrow to attempt re-evaluation.   Layal Javid 11/29/2020, 2:49 PM

## 2020-11-29 NOTE — Progress Notes (Signed)
PROGRESS NOTE    Eric [name]  AST:[id number] DOB: 08-09-1971 DOA: 11/15/2020 PCP: [facility]    Chief Complaint  Patient presents with  . Groin Pain    Brief Narrative:  Eric [name] is a 50 y.o. male no known medical history. Patient presented secondary to right groin pain and found to have a retroperitoneal abscess/bowel perforation/cecal mass concerning for neoplasm. Incidental finding of COVID-19 positive status. General surgery consulted , with poor oral intake, protein calorie malnutrition, started on TPN, had IR drain of his retroperitoneal abscess, improving on IV Zosyn, biopsy of liver mass showed metastasis of primary colon adenocarcinoma.  Subjective:  POD#1, c/o post op pain, does not want to use incentive spirometer due to pain  no fever, no n/v,  on full liquid diet, ostomy no output yet   Assessment & Plan:   Principal Problem:   Retroperitoneal abscess ([place]) Active Problems:   Colonic mass   Iron deficiency anemia due to chronic blood loss   Sepsis ([place])   COVID-19 virus infection   Thrombocytosis   Hyponatremia   Protein-calorie malnutrition, severe (H[facility])   Liver mass, right lobe   Right groin pain   Ambulates with cane   Tobacco abuse   Abdominal pain, RLQ (right lower quadrant)   Fatigue   Symptomatic anemia   Malnutrition of moderate degree   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)   Sepsis present on admission due to perforated colon adenocarcinoma with retroperitoneal abscess -Status post IR drain, abscess culture positive for Enterobacter,Blood culture no growth, he is currently on Zosyn -s/p  URo stent placement and ex lap with right colectomy , ileostomy creation on February 17 -WBC 21.4 on presentation, WBC nadir to 8.2, still fluctuating  --Plan per general surgery/urology  Blood loss anemia, likely from chronic GI bleed from colon cancer -Hemoglobin 5.4 on presentation -Received PRBC transfusion x4  units -Hemoglobin has been above 9 for last few days  Thrombocytosis Likely reactive Platelet 1165 on presentation, platelet nadir to 773, still fluctuating  Hyponatremia -Has been hyponatremia since admission , sodium 129 on presentation ,sodium nadir at 124, today 132 -He does not appear symptomatic, clinically he is dry, however urine sodium is 148 -continue  sodium tablet supplement   COVID-19 infection - Incidental. Asymptomatic. On room air.  Treated with 3 days of remdesivir -He can come off isolation on February 15  Nutritional Assessment:  The patient's BMI is: Body mass index is 20.75 kg/m.Marland Kitchen  Seen by dietician.  I agree with the assessment and plan as outlined below:  Nutrition Status: Nutrition Problem: Moderate Malnutrition Etiology:  (illness) Signs/Symptoms: severe muscle depletion,moderate fat depletion,moderate muscle depletion Interventions: Ensure Enlive (each supplement provides 350kcal and 20 grams of protein),TPN  Initially on TPN, off TPN February 13, currently on clear liquid  Tobacco abuse -Smoking cessation education provided, continue nicotine patch    Unresulted Labs (From admission, onward)          Start     Ordered   12/02/20 0000  Prealbumin  Weekly,   R     Question:  Specimen collection method  Answer:  IV Team=IV Team collect   11/25/20 1234   11/30/20 0500  CBC with Differential/Platelet  Tomorrow morning,   R       Question:  Specimen collection method  Answer:  IV Team=IV Team collect   11/29/20 1751   11/30/20 9892  Basic metabolic panel  Tomorrow morning,   R  Question:  Specimen collection method  Answer:  IV Team=IV Team collect   11/29/20 1751             DVT prophylaxis: heparin injection 5,000 Units Start: 11/29/20 0800 SCDs Start: 11/15/20 2135   Code Status: Full Family Communication: Sister at bedside on 2/16 Disposition:   Status is: Inpatient  Dispo: The patient is from: Home               Anticipated d/c is to: To be determined              Anticipated d/c date is:               Patient currently not ready to discharge  Consultants:   General surgery  Palliative care  Oncology Dr. Benay Spice  Interventional radiology  Procedures:    PICC line placement February 7  Retroperitoneal abscess drain placement on February 7 s/p  URo stent placement and ex lap with right colectomy , ileostomy creation on February 17   Antimicrobials:   Anti-infectives (From admission, onward)   Start     Dose/Rate Route Frequency Ordered Stop   11/16/20 1000  remdesivir 100 mg in sodium chloride 0.9 % 100 mL IVPB       "Followed by" Linked Group Details   100 mg 200 mL/hr over 30 Minutes Intravenous Daily 11/15/20 2135 11/17/20 1002   11/15/20 2230  piperacillin-tazobactam (ZOSYN) IVPB 3.375 g        3.375 g 12.5 mL/hr over 240 Minutes Intravenous Every 8 hours 11/15/20 2144     11/15/20 2134  remdesivir 200 mg in sodium chloride 0.9% 250 mL IVPB       "Followed by" Linked Group Details   200 mg 580 mL/hr over 30 Minutes Intravenous Once 11/15/20 2135 11/16/20 0029   11/15/20 1700  piperacillin-tazobactam (ZOSYN) IVPB 3.375 g        3.375 g 100 mL/hr over 30 Minutes Intravenous  Once 11/15/20 1657 11/15/20 1826          Objective: Vitals:   11/29/20 1045 11/29/20 1202 11/29/20 1400 11/29/20 1500  BP: (!) 141/98   (!) 145/91  Pulse: 76   82  Resp: 18 20 20 19   Temp: 98.3 F (36.8 C)   98 F (36.7 C)  TempSrc: Oral   Oral  SpO2: 100% (!) 2% 95% 98%  Weight:      Height:        Intake/Output Summary (Last 24 hours) at 11/29/2020 1751 Last data filed at 11/29/2020 1500 Gross per 24 hour  Intake 1305.68 ml  Output 2495 ml  Net -1189.32 ml   Filed Weights   11/15/20 2000  Weight: 69.4 kg    Examination:  General exam: Thin ,calm, NAD Respiratory system: Clear to auscultation. Respiratory effort normal. Cardiovascular system: S1 & S2 heard, RRR. No JVD, no  murmur, No pedal edema. Gastrointestinal system: Postop changes, dressing intact, decreased bowel sounds, positive drains, + ieostomy with pick stoma, no output seen Central nervous system: Alert and oriented. No focal neurological deficits. Extremities: Symmetric 5 x 5 power. Skin: No rashes, lesions or ulcers Psychiatry: Judgement and insight appear normal. Mood & affect appropriate.     Data Reviewed: I have personally reviewed following labs and imaging studies  CBC: Recent Labs  Lab 11/25/20 0304 11/26/20 0311 11/27/20 0341 11/28/20 0338 11/29/20 0352  WBC 11.7* 9.9 14.1* 8.2 13.6*  HGB 9.7* 9.8* 9.1* 9.2* 9.6*  HCT 31.4* 32.8* 28.9* 29.5*  31.1*  MCV 74.6* 76.1* 75.1* 76.6* 76.0*  PLT 920* 915* 831* 773* 840*    Basic Metabolic Panel: Recent Labs  Lab 11/24/20 0311 11/25/20 0304 11/26/20 0311 11/27/20 0341 11/28/20 0338 11/29/20 0352  NA 131* 130* 128* 129* 131* 132*  K 4.5 4.5 4.5 4.5 4.6 4.6  CL 99 96* 94* 95* 97* 97*  CO2 23 25 25 24 25 25   GLUCOSE 142* 110* 108* 102* 109* 126*  BUN 20 20 19 16 15 16   CREATININE 0.63 0.72 0.80 0.78 0.81 0.83  CALCIUM 8.5* 8.9 9.0 8.9 8.9 9.2  MG 2.2 2.4  --   --  2.3  --   PHOS 4.8* 5.7*  --   --   --   --     GFR: Estimated Creatinine Clearance: 103.4 mL/min (by C-G formula based on SCr of 0.83 mg/dL).  Liver Function Tests: Recent Labs  Lab 11/23/20 0306 11/24/20 0311 11/25/20 0304 11/26/20 0311 11/27/20 0341  AST 37 45* 34 25 20  ALT 43 51* 43 38 29  ALKPHOS 109 106 109 126 103  BILITOT 0.4 0.1* 0.6 0.4 0.7  PROT 6.3* 6.4* 6.7 6.4* 6.2*  ALBUMIN 1.9* 2.0* 2.2* 2.2* 2.2*    CBG: Recent Labs  Lab 11/28/20 1636 11/28/20 2023 11/29/20 0744 11/29/20 1110 11/29/20 1609  GLUCAP 132* 135* 124* 132* 137*     Recent Results (from the past 240 hour(s))  Aerobic/Anaerobic Culture (surgical/deep wound)     Status: None   Collection Time: 11/20/20  4:22 PM   Specimen: Biopsy; Tissue  Result Value Ref  Range Status   Specimen Description BIOPSY LIVER  Final   Special Requests Normal  Final   Gram Stain   Final    RARE WBC PRESENT, PREDOMINANTLY PMN NO ORGANISMS SEEN    Culture   Final    No growth aerobically or anaerobically. Performed at [facility], [place] [address], [place], [place] [address]    Report Status 11/25/2020 FINAL  Final  Surgical pcr screen     Status: None   Collection Time: 11/27/20 12:36 AM   Specimen: Nasal Mucosa; Nasal Swab  Result Value Ref Range Status   MRSA, PCR NEGATIVE NEGATIVE Final   Staphylococcus aureus NEGATIVE NEGATIVE Final    Comment: (NOTE) The Xpert SA Assay (FDA approved for NASAL specimens in patients 9 years of age and older), is one component of a comprehensive surveillance program. It is not intended to diagnose infection nor to guide or monitor treatment. Performed at [facility], [place] [address], [place],  [address]          Radiology Studies: No results found.      Scheduled Meds: . acetaminophen  650 mg Oral Q6H  . bisacodyl  10 mg Rectal Daily  . Chlorhexidine Gluconate Cloth  6 each Topical Daily  . feeding supplement  237 mL Oral QID  . ferrous sulfate  325 mg Oral BID WC  . heparin injection (subcutaneous)  5,000 Units Subcutaneous Q8H  . influenza vac split quadrivalent PF  0.5 mL Intramuscular Tomorrow-1000  . insulin aspart  0-15 Units Subcutaneous TID WC  . insulin aspart  0-5 Units Subcutaneous QHS  . lactobacillus  1 g Oral TID WC  . lidocaine  1 patch Transdermal Daily  . lip balm  1 application Topical BID  . morphine   Intravenous Q4H  . nicotine  21 mg Transdermal Daily  . polycarbophil  625 mg Oral  BID  . saccharomyces boulardii  250 mg Oral BID  . sodium chloride flush  10-40 mL Intracatheter Q12H  . sodium chloride flush  5 mL Intracatheter Q8H  . sodium chloride  1 g Oral BID WC   Continuous Infusions: . sodium chloride Stopped (11/25/20 6773)  . methocarbamol (ROBAXIN)  IV 1,000 mg (11/29/20 0503)  . piperacillin-tazobactam (ZOSYN)  IV 3.375 g (11/29/20 1351)     LOS: 14 days   Time spent: 35mins Greater than 50% of this time was spent in counseling, explanation of diagnosis, planning of further management, and coordination of care.   Voice Recognition Viviann Spare dictation system was used to create this note, attempts have been made to correct errors. Please contact the author with questions and/or clarifications.   Florencia Reasons, MD PhD FACP [facility]  Available via Epic secure chat 7am-7pm for nonurgent issues Please page for urgent issues To page the attending provider between 7A-7P or the covering provider during after hours 7P-7A, please log into the web site [email or web address] and access using universal [place] password for that web site. If you do not have the password, please call the hospital operator.    11/29/2020, 5:51 PM

## 2020-11-29 NOTE — Progress Notes (Signed)
PT Cancellation Note  Patient Details Name: Eric [name] MRN: [id number] DOB: [birth year]   Cancelled Treatment:    Reason Eval/Treat Not Completed: Fatigue/lethargy limiting ability to participate. Patient asks to rest. Does not want to participate this morning. Will re-attempt later today as time allows.     Steffen Hase 11/29/2020, 10:22 AM

## 2020-11-29 NOTE — Plan of Care (Signed)

## 2020-11-29 NOTE — Consult Note (Addendum)
[place] Nurse ostomy consult note Patient receiving care in [facility] [phone] Stoma type/location: RLQ Ileostomy Stomal assessment/size: 2 1/4" red budded swollen Peristomal assessment: Skin around stoma is intact Output: thin watery serosanguinous fluid in pouch 100 mls Ostomy pouching: 2pc. 2 3/4"  Education provided: Patient alert but in a lot of pain and not yet teachable. Enrolled patient in [place] program: No  2 piece pouching system  Fecal pouch Kellie Simmering #[id number]   Skin Nils Pyle # [address] # [phone]  Ordered by secretary  Cathlean Marseilles. Tamala Julian, MSN, RN, [place], Lysle Pearl, [facility] Wound Treatment Associate Pager [phone]

## 2020-11-29 NOTE — Progress Notes (Signed)
1 Day Post-Op  Subjective: C/o post-op abd pain. Has not been OOB. Pulling 1250 on IS. Foley in place.   ROS: See above, otherwise other systems negative  Objective: Vital signs in last 24 hours: Temp:  [97.3 F (36.3 C)-98.5 F (36.9 C)] 98 F (36.7 C) (02/18 0438) Pulse Rate:  [76-87] 80 (02/18 0438) Resp:  [14-28] 20 (02/18 0823) BP: (137-162)/(89-104) 143/90 (02/18 0438) SpO2:  [97 %-100 %] 99 % (02/18 0823) FiO2 (%):  [98 %] 98 % (02/17 1317) Last BM Date: 11/29/20  Intake/Output from previous day: 02/17 0701 - 02/18 0700 In: 2710 [P.O.:360; I.V.:1600; IV Piggyback:550] Out: 1600 [Urine:1345; Drains:155; Blood:100] Intake/Output this shift: Total I/O In: 300 [P.O.:300] Out: 250 [Urine:250]   PE: Abd: soft, appropriately tender, minimal distention, ileostomy in R hemiabdomen - edematous and viable, serous fluid in ostomy pouch- no gas or stool. Midline wound with some SS strike-through on bandage.  Lab Results:  Recent Labs    11/28/20 0338 11/29/20 0352  WBC 8.2 13.6*  HGB 9.2* 9.6*  HCT 29.5* 31.1*  PLT 773* 840*   BMET Recent Labs    11/28/20 0338 11/29/20 0352  NA 131* 132*  K 4.6 4.6  CL 97* 97*  CO2 25 25  GLUCOSE 109* 126*  BUN 15 16  CREATININE 0.81 0.83  CALCIUM 8.9 9.2   PT/INR No results for input(s): LABPROT, INR in the last 72 hours. CMP     Component Value Date/Time   NA 132 (L) 11/29/2020 0352   K 4.6 11/29/2020 0352   CL 97 (L) 11/29/2020 0352   CO2 25 11/29/2020 0352   GLUCOSE 126 (H) 11/29/2020 0352   BUN 16 11/29/2020 0352   CREATININE 0.83 11/29/2020 0352   CALCIUM 9.2 11/29/2020 0352   PROT 6.2 (L) 11/27/2020 0341   ALBUMIN 2.2 (L) 11/27/2020 0341   AST 20 11/27/2020 0341   ALT 29 11/27/2020 0341   ALKPHOS 103 11/27/2020 0341   BILITOT 0.7 11/27/2020 0341   GFRNONAA >60 11/29/2020 0352   Lipase  No results found for: LIPASE     Studies/Results: No results found.  Anti-infectives: Anti-infectives (From  admission, onward)   Start     Dose/Rate Route Frequency Ordered Stop   11/16/20 1000  remdesivir 100 mg in sodium chloride 0.9 % 100 mL IVPB       "Followed by" Linked Group Details   100 mg 200 mL/hr over 30 Minutes Intravenous Daily 11/15/20 2135 11/17/20 1002   11/15/20 2230  piperacillin-tazobactam (ZOSYN) IVPB 3.375 g        3.375 g 12.5 mL/hr over 240 Minutes Intravenous Every 8 hours 11/15/20 2144     11/15/20 2134  remdesivir 200 mg in sodium chloride 0.9% 250 mL IVPB       "Followed by" Linked Group Details   200 mg 580 mL/hr over 30 Minutes Intravenous Once 11/15/20 2135 11/16/20 0029   11/15/20 1700  piperacillin-tazobactam (ZOSYN) IVPB 3.375 g        3.375 g 100 mL/hr over 30 Minutes Intravenous  Once 11/15/20 1657 11/15/20 1826       Assessment/Plan Anemia Hyponatremia Tobacco abuse COVID-19positive  Severe malnutrition - prealbumin 5.8 (2/8), continue TPN  Colonmass with perforationand RTP abscess Stage IV adenocarcinomacolon  S/P EXPLORATORY LAPAROTOMY, RIGHT COLECTOMY, END ILEOSTOMY CREATION, CYSTOSCOPY W/ STENT PLACEMENT 11/28/20 (Dr. Rosendo Gros; Dr. Abner Greenspan) - POD#1 - WBC 13, monitor - s/p IR drain placement 2/7 - this drain was removed intra-operatively and new  blake drain placed; IR cx Enterobacter aerogenes - continue drain and IV zosyn  - continue FLD and await bowel function - PRN analgesics and antiemetics  - OOB/IS   Liver metastasis  - x3 noted on MRI, unsure if abscess vs metastatic disease.IR liver biopsy 11/20/2020; -A. LIVER, RIGHT MASS, NEEDLE CORE BIOPSY:  - Adenocarcinoma.  COMMENT:  The morphology suggests metastatic colorectal adenocarcinoma.  Immunohistochemistry will be performed and reported as an addendum.  There is sufficient tissue for additional testing.   ID -zosyn 2/4>> FEN - full liquids VTE:  SQ heparin Foley -D/C Today POD#1    LOS: 14 days    Jill Alexanders , [facility] Surgery 11/29/2020,  10:19 AM Please see Amion for pager number during day hours 7:00am-4:30pm or 7:00am -11:30am on weekends

## 2020-11-29 NOTE — Progress Notes (Signed)
Foley catheter removed at around 1215.  Pt voided 100 mls.  Bladders can showed 5 mls.  MD notified.  IV fluids ordered.  Will continue to monitor.

## 2020-11-29 NOTE — Progress Notes (Signed)
Palliative:  HPI:49 y.o.malewith past medical history of tobacco abuseadmitted on2/4/2022with right groin pain and found to have retroperitoneal abscess, bowel perforation, cecal mass concerning for neoplasm with liver lesion present as well.Also with incidental finding of COVID+ infection.  I met today again with Eric [name]. No family at bedside. Eric [name] is having a lot of pain and discomfort today after his surgery. However, he continues to be in good spirits and tells me "I'm just going to rest today and tomorrow will be a better day." I told him that I feel this is a good plan. I discussed with him PCA as he is avoiding hitting button for delivery of pain medication. I educated that the longer he waits and the worse his pain gets the less effective the pain medication will be for him. I encouraged him to press delivery button when having pain as there are limits in place to prevent him from getting too much medication. Eric [name] expresses understanding that his sister is coming this afternoon so they can complete and notarize Emergency planning/management officer. Otherwise he just wants to rest and sleep today.   All questions/concerns addressed. Emotional support provided.   Exam: Alert, oriented. Cachectic with temporal muscle wasting. No distress but visible grimacing with pain. Attached to PCA. Breathing regular, unlabored. Abd flat with surgical dressing intact. R flank drain serosanguinous drainage. Moves all extremities.   Plan: - Hopeful for recovery and plans for follow up with oncology for treatment options.  - Plans to complete Advance Directive this afternoon.   15 min  Vinie Sill, NP Palliative Medicine Team Pager [phone] (Please see [email or web address] for schedule) Team Phone [phone]    Greater than 50%  of this time was spent counseling and coordinating care related to the above assessment and plan

## 2020-11-30 LAB — CBC WITH DIFFERENTIAL/PLATELET
Abs Immature Granulocytes: 0.14 10*3/uL — ABNORMAL HIGH (ref 0.00–0.07)
Basophils Absolute: 0.1 10*3/uL (ref 0.0–0.1)
Basophils Relative: 1 %
Eosinophils Absolute: 0.1 10*3/uL (ref 0.0–0.5)
Eosinophils Relative: 1 %
HCT: 33.2 % — ABNORMAL LOW (ref 39.0–52.0)
Hemoglobin: 9.9 g/dL — ABNORMAL LOW (ref 13.0–17.0)
Immature Granulocytes: 1 %
Lymphocytes Relative: 8 %
Lymphs Abs: 1.4 10*3/uL (ref 0.7–4.0)
MCH: 23 pg — ABNORMAL LOW (ref 26.0–34.0)
MCHC: 29.8 g/dL — ABNORMAL LOW (ref 30.0–36.0)
MCV: 77 fL — ABNORMAL LOW (ref 80.0–100.0)
Monocytes Absolute: 1.2 10*3/uL — ABNORMAL HIGH (ref 0.1–1.0)
Monocytes Relative: 7 %
Neutro Abs: 14.5 10*3/uL — ABNORMAL HIGH (ref 1.7–7.7)
Neutrophils Relative %: 82 %
Platelets: 860 10*3/uL — ABNORMAL HIGH (ref 150–400)
RBC: 4.31 MIL/uL (ref 4.22–5.81)
WBC: 17.5 10*3/uL — ABNORMAL HIGH (ref 4.0–10.5)
nRBC: 0 % (ref 0.0–0.2)

## 2020-11-30 LAB — BASIC METABOLIC PANEL
Anion gap: 9 (ref 5–15)
BUN: 18 mg/dL (ref 6–20)
CO2: 28 mmol/L (ref 22–32)
Calcium: 8.9 mg/dL (ref 8.9–10.3)
Chloride: 95 mmol/L — ABNORMAL LOW (ref 98–111)
Creatinine, Ser: 0.76 mg/dL (ref 0.61–1.24)
GFR, Estimated: >60 mL/min (ref >60)
Glucose, Bld: 156 mg/dL — ABNORMAL HIGH (ref 70–99)
Potassium: 4.2 mmol/L (ref 3.5–5.1)
Sodium: 132 mmol/L — ABNORMAL LOW (ref 135–145)

## 2020-11-30 LAB — GLUCOSE, CAPILLARY
Glucose-Capillary: 105 mg/dL — ABNORMAL HIGH (ref 70–99)
Glucose-Capillary: 147 mg/dL — ABNORMAL HIGH (ref 70–99)
Glucose-Capillary: 146 mg/dL — ABNORMAL HIGH (ref 70–99)
Glucose-Capillary: 142 mg/dL — ABNORMAL HIGH (ref 70–99)

## 2020-11-30 MED ORDER — OXYCODONE HCL 5 MG PO TABS
10.0000 mg | ORAL_TABLET | ORAL | Status: DC | PRN
  Administered 2020-11-30 – 2020-12-01 (×2): 10 mg via ORAL
  Filled 2020-11-30 (×3): qty 2

## 2020-11-30 MED ORDER — LACTATED RINGERS IV BOLUS: 1000.0000 mL | Freq: Three times a day (TID) | INTRAVENOUS | Status: AC | PRN

## 2020-11-30 MED ORDER — SODIUM CHLORIDE 0.9 % IV SOLN: INTRAVENOUS | Status: DC

## 2020-11-30 MED ORDER — ENALAPRILAT 1.25 MG/ML IV SOLN
0.6250 mg | Freq: Four times a day (QID) | INTRAVENOUS | Status: DC | PRN
  Filled 2020-11-30: qty 1

## 2020-11-30 MED ORDER — FAMOTIDINE IN NACL 20-0.9 MG/50ML-% IV SOLN
20.0000 mg | INTRAVENOUS | Status: DC
  Administered 2020-11-30: 20 mg via INTRAVENOUS
  Filled 2020-11-30 (×3): qty 50

## 2020-11-30 MED ORDER — CEFAZOLIN SODIUM-DEXTROSE 2-4 GM/100ML-% IV SOLN
2.0000 g | INTRAVENOUS | Status: DC
  Filled 2020-11-30: qty 100

## 2020-11-30 MED ORDER — CHLORHEXIDINE GLUCONATE CLOTH 2 % EX PADS: 6.0000 | MEDICATED_PAD | Freq: Once | CUTANEOUS | Status: DC

## 2020-11-30 MED ORDER — ACETAMINOPHEN 500 MG PO TABS
1000.0000 mg | ORAL_TABLET | Freq: Four times a day (QID) | ORAL | Status: DC
  Administered 2020-11-30 – 2020-12-15 (×33): 1000 mg via ORAL
  Filled 2020-11-30 (×39): qty 2

## 2020-11-30 MED ORDER — HYDROMORPHONE HCL 1 MG/ML IJ SOLN
0.5000 mg | INTRAMUSCULAR | Status: DC | PRN
  Administered 2020-11-30 – 2020-12-03 (×16): 1 mg via INTRAVENOUS
  Filled 2020-11-30 (×16): qty 1

## 2020-11-30 MED ORDER — GABAPENTIN 300 MG PO CAPS
300.0000 mg | ORAL_CAPSULE | ORAL | Status: DC
  Filled 2020-11-30: qty 1

## 2020-11-30 MED ORDER — ACETAMINOPHEN 500 MG PO TABS
1000.0000 mg | ORAL_TABLET | ORAL | Status: DC
  Filled 2020-11-30: qty 2

## 2020-11-30 NOTE — Progress Notes (Addendum)
Eric [name] [id number] 10-16-1970  CARE TEAM:  PCP: [facility]  Outpatient Care Team: Patient Care Team: [facility] as PCP - General (Family Medicine) Michael Boston, MD as Consulting Physician (Colon and Rectal Surgery) Armbruster, Carlota Raspberry, MD as Consulting Physician (Gastroenterology) Ladell Pier, MD as Consulting Physician (Oncology)  Inpatient Treatment Team: Treatment Team: Attending Provider: Florencia Reasons, MD; Rounding Team: Nolon Nations, MD; Consulting Physician: Michael Boston, MD; Consulting Physician: Ladell Pier, MD; Nurse Practitioner: Maryanna Shape, NP; Utilization Review: Orlean Bradford, RN; Technician: Gasper Sells, [place]; Rounding Team: Kelvin Cellar, MD; Technician: Marita Kansas, [place]; Registered Nurse: Salley Slaughter, RN; Licensed Practical Nurse: Corliss Marcus, LPN; Technician: Ileene Patrick, NT; Technician: Glennon Hamilton, NT; Registered Nurse: Debby Bud, RN; Case Manager: Bartholomew Crews, RN; Social Worker: Gabrielle Dare; Physical Therapist: Harvie Heck, PT   Problem List:   Principal Problem:   Retroperitoneal abscess [facility]) Active Problems:   Colonic mass   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 ([place])   Protein-calorie malnutrition, severe ([place])   Iron deficiency anemia due to chronic blood loss   Sepsis ([place])   COVID-19 virus infection   Thrombocytosis   Hyponatremia   Liver mass, right lobe   Right groin pain   Ambulates with cane   Tobacco abuse   Abdominal pain, RLQ (right lower quadrant)   Fatigue   Symptomatic anemia   Malnutrition of moderate degree   2 Days Post-Op  11/28/2020  POST-OPERATIVE DIAGNOSIS:  ADENOCARCINOMA OF COLON  PROCEDURE:  Procedure(s) with comments: EXPLORATORY LAPAROTOMY (N/A) - DOW CASE TO START AT 730AM 4 HOUR CASE RIGHT COLECTOMY (Right) ILEOSTOMY CREATION (N/A) CYSTOSCOPY WITH STENT PLACEMENT (N/A)  SURGEON:   Surgeon(s) and Role: Panel 1:    Ralene Ok, MD - Primary   Assessment  FAIR with ileus  [facility] Stay = 15 days)  Assessment/Plan Anemia Hyponatremia Tobacco abuse COVID-19positive  Severe malnutrition - prealbumin 5.8 (2/8), continue TPN  Colonmass with perforationand RTP abscess Stage IV adenocarcinomacolon  S/P EXPLORATORY LAPAROTOMY, RIGHT COLECTOMY, END ILEOSTOMY CREATION, CYSTOSCOPY W/ STENT PLACEMENT 11/28/20 (Dr. Rosendo Gros; Dr. Abner Greenspan)  - WBC 13, monitor - s/p IR drain placement 2/7 - this drain was removed intra-operatively and new blake drain placed; IR cx Enterobacter aerogenes - continue drain and IV zosyn x 5d postop - down to clears w n/v await bowel function - NGT if emesis again - PRN analgesics and antiemetics  - OOB/IS   Liver metastasis  - x3 noted on MRI, unsure if abscess vs metastatic disease.IR liver biopsy 11/20/2020; -A. LIVER, RIGHT MASS, NEEDLE CORE BIOPSY:  - Adenocarcinoma.  COMMENT:  The morphology suggests metastatic colorectal adenocarcinoma.  Immunohistochemistry will be performed and reported as an addendum.  There is sufficient tissue for additional testing.   PATIENT WITH NEED PORT-A -CATH.  Will try to do prior to d/c next week  ID -zosyn 2/4>> FEN -full liquids VTE: SQ heparin Foley -D/C Today POD#1        30 minutes spent in review, evaluation, examination, counseling, and coordination of care.   I have reviewed this patient's available data, including medical history, events of note, physical examination and test results as part of my evaluation.  A significant portion of that time was spent in counseling.  Care during the described time interval was provided by me.  11/30/2020    Subjective: (Chief complaint)  N/v Bloated Sore - PCA  helps  Objective:  Vital signs:  Vitals:   11/29/20 2044 11/30/20 0001 11/30/20 0203 11/30/20 0438  BP: (!) 142/89   (!) 128/100  Pulse: 81   (!) 107   Resp: 19 18 18 20   Temp: 98.1 F (36.7 C)   98.4 F (36.9 C)  TempSrc: Oral   Oral  SpO2: 98% 98% 97% 98%  Weight:      Height:        Last BM Date: 11/29/20  Intake/Output   Yesterday:  02/18 0701 - 02/19 0700 In: 1779.4 [P.O.:600; I.V.:829.5; IV Piggyback:349.9] Out: 9379 [Urine:1700; Emesis/NG output:500; Drains:370] This shift:  Total I/O In: -  Out: 30 [Drains:30]  Bowel function:  Flatus: No  BM:  No  Drain: Serous   Physical Exam:  General: Pt awake/alert in mild acute distress Eyes: PERRL, normal EOM.  Sclera clear.  No icterus Neuro: CN II-XII intact w/o focal sensory/motor deficits. Lymph: No head/neck/groin lymphadenopathy Psych:  No delerium/psychosis/paranoia.  Oriented x 4 HENT: Normocephalic, Mucus membranes moist.  No thrush Neck: Supple, No tracheal deviation.  No obvious thyromegaly Chest: No pain to chest wall compression.  Good respiratory excursion.  No audible wheezing CV:  Pulses intact.  Regular rhythm.  No major extremity edema MS: Normal AROM mjr joints.  No obvious deformity  Abdomen: Somewhat firm.  Moderately distended.  Mildly tender at incisions only.  Ileostomy pink w serous effluent & no flatus No evidence of peritonitis.  No incarcerated hernias.  Ext:  No deformity.  No mjr edema.  No cyanosis Skin: No petechiae / purpurea.  No major sores.  Warm and dry    Results:   Cultures: Recent Results (from the past 720 hour(s))  SARS Coronavirus 2 by RT PCR (hospital order, performed in [facility] hospital lab) Nasopharyngeal Nasopharyngeal Swab     Status: Abnormal   Collection Time: 11/15/20 12:41 PM   Specimen: Nasopharyngeal Swab  Result Value Ref Range Status   SARS Coronavirus 2 POSITIVE (A) NEGATIVE Final    Comment: RESULT CALLED TO, READ BACK BY AND VERIFIED WITH: E SHAFFER RN 1352 11/15/20 A BROWNING (NOTE) SARS-CoV-2 target nucleic acids are DETECTED  SARS-CoV-2 RNA is generally detectable in upper respiratory  specimens  during the acute phase of infection.  Positive results are indicative  of the presence of the identified virus, but do not rule out bacterial infection or co-infection with other pathogens not detected by the test.  Clinical correlation with patient history and  other diagnostic information is necessary to determine patient infection status.  The expected result is negative.  Fact Sheet for Patients:   StrictlyIdeas.no   Fact Sheet for Healthcare Providers:   BankingDealers.co.za    This test is not yet approved or cleared by the [place] FDA and  has been authorized for detection and/or diagnosis of SARS-CoV-2 by FDA under an Emergency Use Authorization (EUA).  This EUA will remain in effect (meaning this t est can be used) for the duration of  the COVID-19 declaration under Section 564(b)(1) of the Act, 21 U.S.C. section 360-bbb-3(b)(1), unless the authorization is terminated or revoked sooner.  Performed at [facility], [place] [address] [place], [place] [address]   Blood Culture (routine x 2)     Status: None   Collection Time: 11/15/20  1:52 PM   Specimen: BLOOD RIGHT FOREARM  Result Value Ref Range Status   Specimen Description BLOOD RIGHT FOREARM  Final   Special Requests   Final  BOTTLES DRAWN AEROBIC AND ANAEROBIC Blood Culture adequate volume   Culture   Final    NO GROWTH 5 DAYS Performed at [facility], [place] [address], [place], [place] [address]    Report Status 11/20/2020 FINAL  Final  Blood Culture (routine x 2)     Status: None   Collection Time: 11/15/20  2:13 PM   Specimen: BLOOD LEFT FOREARM  Result Value Ref Range Status   Specimen Description BLOOD LEFT FOREARM  Final   Special Requests   Final    BOTTLES DRAWN AEROBIC AND ANAEROBIC Blood Culture adequate volume   Culture   Final    NO GROWTH 5 DAYS Performed at [facility], [place] [address] [place], [place] [address]     Report Status 11/20/2020 FINAL  Final  Aerobic/Anaerobic Culture (surgical/deep wound)     Status: None   Collection Time: 11/18/20  4:34 PM   Specimen: Abscess  Result Value Ref Range Status   Specimen Description ABSCESS  Final   Special Requests ABDOMEN  Final   Gram Stain   Final    FEW WBC PRESENT, PREDOMINANTLY PMN MODERATE GRAM NEGATIVE RODS FEW GRAM POSITIVE COCCI IN PAIRS IN CHAINS FEW GRAM POSITIVE RODS    Culture   Final    FEW ENTEROBACTER AEROGENES MODERATE STREPTOCOCCUS GROUP F Beta hemolytic streptococci are predictably susceptible to penicillin and other beta lactams. Susceptibility testing not routinely performed. ABUNDANT BACTEROIDES FRAGILIS BETA LACTAMASE POSITIVE Performed at [facility], [place] [address] [place], [place] [address]    Report Status 11/21/2020 FINAL  Final   Organism ID, Bacteria ENTEROBACTER AEROGENES  Final      Susceptibility   Enterobacter aerogenes - MIC*    CEFAZOLIN >=64 RESISTANT Resistant     CEFEPIME <=0.12 SENSITIVE Sensitive     CEFTAZIDIME <=1 SENSITIVE Sensitive     CEFTRIAXONE <=0.25 SENSITIVE Sensitive     CIPROFLOXACIN <=0.25 SENSITIVE Sensitive     GENTAMICIN <=1 SENSITIVE Sensitive     IMIPENEM 0.5 SENSITIVE Sensitive     TRIMETH/SULFA <=20 SENSITIVE Sensitive     PIP/TAZO <=4 SENSITIVE Sensitive     * FEW ENTEROBACTER AEROGENES  Aerobic/Anaerobic Culture (surgical/deep wound)     Status: None   Collection Time: 11/20/20  4:22 PM   Specimen: Biopsy; Tissue  Result Value Ref Range Status   Specimen Description BIOPSY LIVER  Final   Special Requests Normal  Final   Gram Stain   Final    RARE WBC PRESENT, PREDOMINANTLY PMN NO ORGANISMS SEEN    Culture   Final    No growth aerobically or anaerobically. Performed at [facility], [place] [address] [place], [place] [address]    Report Status 11/25/2020 FINAL  Final  Surgical pcr screen     Status: None   Collection Time: 11/27/20 12:36 AM   Specimen:  Nasal Mucosa; Nasal Swab  Result Value Ref Range Status   MRSA, PCR NEGATIVE NEGATIVE Final   Staphylococcus aureus NEGATIVE NEGATIVE Final    Comment: (NOTE) The Xpert SA Assay (FDA approved for NASAL specimens in patients 78 years of age and older), is one component of a comprehensive surveillance program. It is not intended to diagnose infection nor to guide or monitor treatment. Performed at [facility], [place] [address], [place], [place] [address]     Labs: Results for orders placed or performed during the hospital encounter of 11/15/20 (from the past 48 hour(s))  Type and screen [place]     Status: None   Collection Time: 11/28/20  9:45 AM  Result Value Ref Range   ABO/RH(D) A POS    Antibody Screen NEG    Sample Expiration      12/01/2020,2359 Performed at [facility], [place] [address], [place], [place] [address]   Glucose, capillary     Status: Abnormal   Collection Time: 11/28/20  4:36 PM  Result Value Ref Range   Glucose-Capillary 132 (H) 70 - 99 mg/dL    Comment: Glucose reference range applies only to samples taken after fasting for at least 8 hours.  Glucose, capillary     Status: Abnormal   Collection Time: 11/28/20  8:23 PM  Result Value Ref Range   Glucose-Capillary 135 (H) 70 - 99 mg/dL    Comment: Glucose reference range applies only to samples taken after fasting for at least 8 hours.  CBC     Status: Abnormal   Collection Time: 11/29/20  3:52 AM  Result Value Ref Range   WBC 13.6 (H) 4.0 - 10.5 K/uL   RBC 4.09 (L) 4.22 - 5.81 MIL/uL   Hemoglobin 9.6 (L) 13.0 - 17.0 g/dL   HCT 31.1 (L) 39.0 - 52.0 %   MCV 76.0 (L) 80.0 - 100.0 fL   MCH 23.5 (L) 26.0 - 34.0 pg   MCHC 30.9 30.0 - 36.0 g/dL   RDW Not Measured 11.5 - 15.5 %   Platelets 840 (H) 150 - 400 K/uL   nRBC 0.0 0.0 - 0.2 %    Comment: Performed at [place] [address] [place], [place] [address]  Basic metabolic panel     Status: Abnormal    Collection Time: 11/29/20  3:52 AM  Result Value Ref Range   Sodium 132 (L) 135 - 145 mmol/L   Potassium 4.6 3.5 - 5.1 mmol/L   Chloride 97 (L) 98 - 111 mmol/L   CO2 25 22 - 32 mmol/L   Glucose, Bld 126 (H) 70 - 99 mg/dL    Comment: Glucose reference range applies only to samples taken after fasting for at least 8 hours.   BUN 16 6 - 20 mg/dL   Creatinine, Ser 0.83 0.61 - 1.24 mg/dL   Calcium 9.2 8.9 - 10.3 mg/dL   GFR, Estimated >60 >60 mL/min    Comment: (NOTE) Calculated using the CKD-EPI Creatinine Equation (2021)    Anion gap 10 5 - 15    Comment: Performed at [facility] [address], [place], [place] [address]  Glucose, capillary     Status: Abnormal   Collection Time: 11/29/20  7:44 AM  Result Value Ref Range   Glucose-Capillary 124 (H) 70 - 99 mg/dL    Comment: Glucose reference range applies only to samples taken after fasting for at least 8 hours.  Glucose, capillary     Status: Abnormal   Collection Time: 11/29/20 11:10 AM  Result Value Ref Range   Glucose-Capillary 132 (H) 70 - 99 mg/dL    Comment: Glucose reference range applies only to samples taken after fasting for at least 8 hours.  Glucose, capillary     Status: Abnormal   Collection Time: 11/29/20  4:09 PM  Result Value Ref Range   Glucose-Capillary 137 (H) 70 - 99 mg/dL    Comment: Glucose reference range applies only to samples taken after fasting for at least 8 hours.  Glucose, capillary  Status: Abnormal   Collection Time: 11/29/20  8:46 PM  Result Value Ref Range   Glucose-Capillary 141 (H) 70 - 99 mg/dL    Comment: Glucose reference range applies only to samples taken after fasting for at least 8 hours.  CBC with Differential/Platelet     Status: Abnormal   Collection Time: 11/30/20  3:15 AM  Result Value Ref Range   WBC 17.5 (H) 4.0 - 10.5 K/uL   RBC 4.31 4.22 - 5.81 MIL/uL   Hemoglobin 9.9 (L) 13.0 - 17.0 g/dL   HCT 33.2 (L) 39.0 - 52.0 %   MCV 77.0 (L) 80.0 - 100.0 fL   MCH 23.0  (L) 26.0 - 34.0 pg   MCHC 29.8 (L) 30.0 - 36.0 g/dL   RDW Not Measured 11.5 - 15.5 %   Platelets 860 (H) 150 - 400 K/uL    Comment: REPEATED TO VERIFY   nRBC 0.0 0.0 - 0.2 %   Neutrophils Relative % 82 %   Neutro Abs 14.5 (H) 1.7 - 7.7 K/uL   Lymphocytes Relative 8 %   Lymphs Abs 1.4 0.7 - 4.0 K/uL   Monocytes Relative 7 %   Monocytes Absolute 1.2 (H) 0.1 - 1.0 K/uL   Eosinophils Relative 1 %   Eosinophils Absolute 0.1 0.0 - 0.5 K/uL   Basophils Relative 1 %   Basophils Absolute 0.1 0.0 - 0.1 K/uL   Immature Granulocytes 1 %   Abs Immature Granulocytes 0.14 (H) 0.00 - 0.07 K/uL    Comment: Performed at [facility], [address], [place], [place] [address]  Basic metabolic panel     Status: Abnormal   Collection Time: 11/30/20  3:15 AM  Result Value Ref Range   Sodium 132 (L) 135 - 145 mmol/L   Potassium 4.2 3.5 - 5.1 mmol/L   Chloride 95 (L) 98 - 111 mmol/L   CO2 28 22 - 32 mmol/L   Glucose, Bld 156 (H) 70 - 99 mg/dL    Comment: Glucose reference range applies only to samples taken after fasting for at least 8 hours.   BUN 18 6 - 20 mg/dL   Creatinine, Ser 0.76 0.61 - 1.24 mg/dL   Calcium 8.9 8.9 - 10.3 mg/dL   GFR, Estimated >60 >60 mL/min    Comment: (NOTE) Calculated using the CKD-EPI Creatinine Equation (2021)    Anion gap 9 5 - 15    Comment: Performed at [facility] [address] [place],  [address]  Glucose, capillary     Status: Abnormal   Collection Time: 11/30/20  7:40 AM  Result Value Ref Range   Glucose-Capillary 105 (H) 70 - 99 mg/dL    Comment: Glucose reference range applies only to samples taken after fasting for at least 8 hours.    Imaging / Studies: No results found.  Medications / Allergies: per chart  Antibiotics: Anti-infectives (From admission, onward)   Start     Dose/Rate Route Frequency Ordered Stop   11/16/20 1000  remdesivir 100 mg in sodium chloride 0.9 % 100 mL IVPB       "Followed by" Linked Group Details    100 mg 200 mL/hr over 30 Minutes Intravenous Daily 11/15/20 2135 11/17/20 1002   11/15/20 2230  piperacillin-tazobactam (ZOSYN) IVPB 3.375 g        3.375 g 12.5 mL/hr over 240 Minutes Intravenous Every 8 hours 11/15/20 2144     11/15/20 2134  remdesivir 200 mg in sodium chloride 0.9%  250 mL IVPB       "Followed by" Linked Group Details   200 mg 580 mL/hr over 30 Minutes Intravenous Once 11/15/20 2135 11/16/20 0029   11/15/20 1700  piperacillin-tazobactam (ZOSYN) IVPB 3.375 g        3.375 g 100 mL/hr over 30 Minutes Intravenous  Once 11/15/20 1657 11/15/20 1826        Note: Portions of this report may have been transcribed using voice recognition software. Every effort was made to ensure accuracy; however, inadvertent computerized transcription errors may be present.   Any transcriptional errors that result from this process are unintentional.    Adin Hector, MD, FACS, MASCRS Gastrointestinal and Minimally Invasive Surgery  [facility] [address], [place], [place] [address] [phone] Fax [phone] Main/Paging  CONTACT INFORMATION: Weekday (9AM-5PM) concerns: Call [facility] main office at [phone] Weeknight (5PM-9AM) or Weekend/Holiday concerns: Check [email or web address] for General Surgery [facility] coverage (Please, do not use SecureChat as it is not reliable communication to operating surgeons for immediate patient care)      11/30/2020  9:43 AM

## 2020-11-30 NOTE — Progress Notes (Signed)
Patient vomited 500 cc of green emesis. Compazine 10 mg IV given.Will continue to monitor.

## 2020-11-30 NOTE — Progress Notes (Signed)
PROGRESS NOTE    Eric [name]  WFU:[id number] DOB: 06/10/1971 DOA: 11/15/2020 PCP: [facility]    Chief Complaint  Patient presents with  . Groin Pain    Brief Narrative:  Eric [name] is a 50 y.o. male no known medical history. Patient presented secondary to right groin pain and found to have a retroperitoneal abscess/bowel perforation/cecal mass concerning for neoplasm. Incidental finding of COVID-19 positive status. General surgery consulted , with poor oral intake, protein calorie malnutrition, started on TPN, had IR drain of his retroperitoneal abscess, improving on IV Zosyn, biopsy of liver mass showed metastasis of primary colon adenocarcinoma.  Subjective:  POD#2, vomited 500cc green emesis around 5am,   c/o post op pain, does not want to use incentive spirometer due to pain  no fever  on full liquid diet, ostomy no output yet   Assessment & Plan:   Principal Problem:   Retroperitoneal abscess ([place]) Active Problems:   Colonic mass   Iron deficiency anemia due to chronic blood loss   Sepsis ([place])   COVID-19 virus infection   Thrombocytosis   Hyponatremia   Protein-calorie malnutrition, severe (HCC)   Liver mass, right lobe   Right groin pain   Ambulates with cane   Tobacco abuse   Abdominal pain, RLQ (right lower quadrant)   Fatigue   Symptomatic anemia   Malnutrition of moderate degree   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)   Sepsis present on admission due to perforated colon adenocarcinoma with retroperitoneal abscess -Status post IR drain, abscess culture positive for Enterobacter,Blood culture no growth, he is currently on Zosyn -s/p  URo stent placement and ex lap with right colectomy , ileostomy creation on February 17 -WBC 21.4 on presentation, WBC nadir to 8.2, still fluctuating  --Plan per general surgery/urology  Blood loss anemia, likely from chronic GI bleed from colon cancer -Hemoglobin 5.4 on  presentation -Received PRBC transfusion x4 units -Hemoglobin has been above 9 for last few days  Thrombocytosis Likely reactive Platelet 1165 on presentation, platelet nadir to 773, still fluctuating  Hyponatremia -Has been hyponatremia since admission , sodium 129 on presentation ,sodium nadir at 124, today 132 -He does not appear symptomatic, clinically he is dry, however urine sodium is 148 -continue  sodium tablet supplement  Elevated fasting blood glucose -check A1c -On SSI   COVID-19 infection - Incidental. Asymptomatic. On room air.  Treated with 3 days of remdesivir -He can come off isolation on February 15  Nutritional Assessment:  The patient's BMI is: Body mass index is 20.75 kg/m.Marland Kitchen  Seen by dietician.  I agree with the assessment and plan as outlined below:  Nutrition Status: Nutrition Problem: Moderate Malnutrition Etiology:  (illness) Signs/Symptoms: severe muscle depletion,moderate fat depletion,moderate muscle depletion Interventions: Ensure Enlive (each supplement provides 350kcal and 20 grams of protein),TPN  Initially on TPN, off TPN February 13, currently on clear liquid  Tobacco abuse -Smoking cessation education provided, continue nicotine patch    Unresulted Labs (From admission, onward)          Start     Ordered   12/02/20 0000  Prealbumin  Weekly,   R     Question:  Specimen collection method  Answer:  IV Team=IV Team collect   11/25/20 1234   12/01/20 0500  CBC  Daily,   R     Question:  Specimen collection method  Answer:  IV Team=IV Team collect   11/30/20 0916   12/01/20 0500  Basic  metabolic panel  Daily,   R     Question:  Specimen collection method  Answer:  IV Team=IV Team collect   11/30/20 0916   12/01/20 0500  Lactic acid, plasma  Tomorrow morning,   R       Question:  Specimen collection method  Answer:  IV Team=IV Team collect   11/30/20 0916   12/01/20 0500  Hemoglobin A1c  Tomorrow morning,   R       Question:   Specimen collection method  Answer:  IV Team=IV Team collect   11/30/20 1340             DVT prophylaxis: heparin injection 5,000 Units Start: 11/29/20 0800 SCDs Start: 11/15/20 2135   Code Status: Full Family Communication: Sister at bedside on 2/16 Disposition:   Status is: Inpatient  Dispo: The patient is from: Home              Anticipated d/c is to: To be determined              Anticipated d/c date is:               Patient currently not ready to discharge  Consultants:   General surgery  Palliative care  Oncology Dr. Benay Spice  Interventional radiology  Procedures:    PICC line placement February 7  Retroperitoneal abscess drain placement on February 7 s/p  URo stent placement and ex lap with right colectomy , ileostomy creation on February 17   Antimicrobials:   Anti-infectives (From admission, onward)   Start     Dose/Rate Route Frequency Ordered Stop   11/30/20 1045  ceFAZolin (ANCEF) IVPB 2g/100 mL premix  Status:  Discontinued        2 g 200 mL/hr over 30 Minutes Intravenous On call to O.R. 11/30/20 0957 11/30/20 1043   11/16/20 1000  remdesivir 100 mg in sodium chloride 0.9 % 100 mL IVPB       "Followed by" Linked Group Details   100 mg 200 mL/hr over 30 Minutes Intravenous Daily 11/15/20 2135 11/17/20 1002   11/15/20 2230  piperacillin-tazobactam (ZOSYN) IVPB 3.375 g        3.375 g 12.5 mL/hr over 240 Minutes Intravenous Every 8 hours 11/15/20 2144     11/15/20 2134  remdesivir 200 mg in sodium chloride 0.9% 250 mL IVPB       "Followed by" Linked Group Details   200 mg 580 mL/hr over 30 Minutes Intravenous Once 11/15/20 2135 11/16/20 0029   11/15/20 1700  piperacillin-tazobactam (ZOSYN) IVPB 3.375 g        3.375 g 100 mL/hr over 30 Minutes Intravenous  Once 11/15/20 1657 11/15/20 1826          Objective: Vitals:   11/30/20 0001 11/30/20 0203 11/30/20 0438 11/30/20 1033  BP:   (!) 128/100   Pulse:   (!) 107   Resp: 18 18 20 17    Temp:   98.4 F (36.9 C)   TempSrc:   Oral   SpO2: 98% 97% 98% 97%  Weight:      Height:        Intake/Output Summary (Last 24 hours) at 11/30/2020 1340 Last data filed at 11/30/2020 0945 Gross per 24 hour  Intake 1599.43 ml  Output 1420 ml  Net 179.43 ml   Filed Weights   11/15/20 2000  Weight: 69.4 kg    Examination:  General exam: Thin ,calm, NAD Respiratory system: Clear to auscultation. Respiratory effort normal.  Cardiovascular system: S1 & S2 heard, RRR. No JVD, no murmur, No pedal edema. Gastrointestinal system: Postop changes, dressing intact, decreased bowel sounds, positive drains, + ieostomy with pick stoma, no output seen Central nervous system: Alert and oriented. No focal neurological deficits. Extremities: Symmetric 5 x 5 power. Skin: No rashes, lesions or ulcers Psychiatry: Judgement and insight appear normal. Mood & affect appropriate.     Data Reviewed: I have personally reviewed following labs and imaging studies  CBC: Recent Labs  Lab 11/26/20 0311 11/27/20 0341 11/28/20 0338 11/29/20 0352 11/30/20 0315  WBC 9.9 14.1* 8.2 13.6* 17.5*  NEUTROABS  --   --   --   --  14.5*  HGB 9.8* 9.1* 9.2* 9.6* 9.9*  HCT 32.8* 28.9* 29.5* 31.1* 33.2*  MCV 76.1* 75.1* 76.6* 76.0* 77.0*  PLT 915* 831* 773* 840* 860*    Basic Metabolic Panel: Recent Labs  Lab 11/24/20 0311 11/25/20 0304 11/26/20 0311 11/27/20 0341 11/28/20 0338 11/29/20 0352 11/30/20 0315  NA 131* 130* 128* 129* 131* 132* 132*  K 4.5 4.5 4.5 4.5 4.6 4.6 4.2  CL 99 96* 94* 95* 97* 97* 95*  CO2 23 25 25 24 25 25 28   GLUCOSE 142* 110* 108* 102* 109* 126* 156*  BUN 20 20 19 16 15 16 18   CREATININE 0.63 0.72 0.80 0.78 0.81 0.83 0.76  CALCIUM 8.5* 8.9 9.0 8.9 8.9 9.2 8.9  MG 2.2 2.4  --   --  2.3  --   --   PHOS 4.8* 5.7*  --   --   --   --   --     GFR: Estimated Creatinine Clearance: 107.2 mL/min (by C-G formula based on SCr of 0.76 mg/dL).  Liver Function Tests: Recent Labs   Lab 11/24/20 0311 11/25/20 0304 11/26/20 0311 11/27/20 0341  AST 45* 34 25 20  ALT 51* 43 38 29  ALKPHOS 106 109 126 103  BILITOT 0.1* 0.6 0.4 0.7  PROT 6.4* 6.7 6.4* 6.2*  ALBUMIN 2.0* 2.2* 2.2* 2.2*    CBG: Recent Labs  Lab 11/29/20 1110 11/29/20 1609 11/29/20 2046 11/30/20 0740 11/30/20 1200  GLUCAP 132* 137* 141* 105* 147*     Recent Results (from the past 240 hour(s))  Aerobic/Anaerobic Culture (surgical/deep wound)     Status: None   Collection Time: 11/20/20  4:22 PM   Specimen: Biopsy; Tissue  Result Value Ref Range Status   Specimen Description BIOPSY LIVER  Final   Special Requests Normal  Final   Gram Stain   Final    RARE WBC PRESENT, PREDOMINANTLY PMN NO ORGANISMS SEEN    Culture   Final    No growth aerobically or anaerobically. Performed at [facility], [place] [address] [place], [place] [address]    Report Status 11/25/2020 FINAL  Final  Surgical pcr screen     Status: None   Collection Time: 11/27/20 12:36 AM   Specimen: Nasal Mucosa; Nasal Swab  Result Value Ref Range Status   MRSA, PCR NEGATIVE NEGATIVE Final   Staphylococcus aureus NEGATIVE NEGATIVE Final    Comment: (NOTE) The Xpert SA Assay (FDA approved for NASAL specimens in patients 29 years of age and older), is one component of a comprehensive surveillance program. It is not intended to diagnose infection nor to guide or monitor treatment. Performed at [facility], [place] [address] [place], [place] [address]          Radiology Studies: No results found.  Scheduled Meds: . acetaminophen  1,000 mg Oral Q6H  . Chlorhexidine Gluconate Cloth  6 each Topical Daily  . feeding supplement  237 mL Oral QID  . heparin injection (subcutaneous)  5,000 Units Subcutaneous Q8H  . influenza vac split quadrivalent PF  0.5 mL Intramuscular Tomorrow-1000  . insulin aspart  0-15 Units Subcutaneous TID WC  . insulin aspart  0-5 Units Subcutaneous QHS  . lidocaine  1  patch Transdermal Daily  . lip balm  1 application Topical BID  . nicotine  21 mg Transdermal Daily  . polycarbophil  625 mg Oral BID  . sodium chloride flush  10-40 mL Intracatheter Q12H  . sodium chloride flush  5 mL Intracatheter Q8H  . sodium chloride  1 g Oral BID WC   Continuous Infusions: . sodium chloride 50 mL/hr at 11/30/20 1038  . famotidine (PEPCID) IV 20 mg (11/30/20 1055)  . lactated ringers    . methocarbamol (ROBAXIN) IV 1,000 mg (11/30/20 0536)  . piperacillin-tazobactam (ZOSYN)  IV 3.375 g (11/30/20 0535)     LOS: 15 days   Time spent: 31mins Greater than 50% of this time was spent in counseling, explanation of diagnosis, planning of further management, and coordination of care.   Voice Recognition Viviann Spare dictation system was used to create this note, attempts have been made to correct errors. Please contact the author with questions and/or clarifications.   Florencia Reasons, MD PhD FACP [facility]  Available via Epic secure chat 7am-7pm for nonurgent issues Please page for urgent issues To page the attending provider between 7A-7P or the covering provider during after hours 7P-7A, please log into the web site [email or web address] and access using universal [place] password for that web site. If you do not have the password, please call the hospital operator.    11/30/2020, 1:40 PM

## 2020-11-30 NOTE — Evaluation (Signed)
Physical Therapy re-Evaluation Patient Details Name: Eric [name] MRN: [id number] DOB: 08-21-1971 Today's Date: 11/30/2020   History of Present Illness  Pt is a 50 y.o. male admitted 11/15/20 with R groin pain; incidental (+) COVID-19. 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 S/p percutaneous drain placement 2/7. Liver lesion biopsy 2/9. PMH includes tobacco use. S/p exploratory laparotomy, colectomy end ostomy placement and cystoscopy with stent.  Clinical Impression  Pt finally today felt up to walking the hallway with PT.  He self elected to use RW for support, cues for use as he was ambulating without a device with PT prior to surgery.  He is generally weak post operatively and has not started a diet yet, liquids only and vomited up what liquids he did have this AM.  Pt is eager to get back to walking and we reviewed abdominal precautions (log roll specifically) post surgery.  He will likely progress well enough to d/c home without PT follow up and has a walker at home if he still feels he needs it.   PT to follow acutely for deficits listed below.    Follow Up Recommendations No PT follow up    Equipment Recommendations  None recommended by PT    Recommendations for Other Services       Precautions / Restrictions Precautions Precautions: Fall;Other (comment) Precaution Comments: JP drain R and R ostomy pouch      Mobility  Bed Mobility Overal bed mobility: Needs Assistance Bed Mobility: Rolling;Sidelying to Sit Rolling: Min assist Sidelying to sit: Min assist       General bed mobility comments: cues for log roll as he started by just trying to put feet over and sit straight up with significant pain.  Min assist to help him log roll and come up from sidelying at his trunk.    Transfers Overall transfer level: Needs assistance Equipment used: Rolling walker (2 wheeled) Transfers: Sit to/from Stand Sit  to Stand: Min assist         General transfer comment: min assist to stand to RW, cues for safe hand placement and RW use.  He reports he has a rollator that was his mother's at the house (she has since passed).  Ambulation/Gait Ambulation/Gait assistance: Min guard Gait Distance (Feet): 260 Feet Assistive device: Rolling walker (2 wheeled) Gait Pattern/deviations: Step-through pattern;Scissoring (mild scissoring with turns)     General Gait Details: Cues for safe RW use, pt self elected to use RW for his first walk since surgery, but is eager to start trying without it when he feels a bit better. Cues for more upright posture.  Stairs            Wheelchair Mobility    Modified Rankin (Stroke Patients Only)       Balance Overall balance assessment: Needs assistance Sitting-balance support: Feet supported;Bilateral upper extremity supported Sitting balance-Leahy Scale: Good     Standing balance support: Bilateral upper extremity supported Standing balance-Leahy Scale: Poor Standing balance comment: reliant on external support today likely due to pain and weakness since surgery                             Pertinent Vitals/Pain Pain Assessment: Faces Faces Pain Scale: Hurts even more Pain Location: abdominal pain post op Pain Descriptors / Indicators: Grimacing;Guarding Pain Intervention(s): Monitored during session;Limited activity within patient's tolerance;Repositioned;Other (comment) (most pain with bed mobility, educated on  log roll.)    Home Living Family/patient expects to be discharged to:: Private residence Living Arrangements: Non-relatives/Friends Available Help at Discharge: Family;Friend(s);Available 24 hours/day Type of Home: House Home Access: Level entry     Home Layout: One level Home Equipment: Hand held shower head;Cane - single point;Tub bench;Walker - 2 wheels;Walker - 4 wheels Additional Comments: Pt lives with friend who works  during day, so he plans to stay at [employer] while friend at work, then return home when friend's off; sister can provide transportation    Prior Function Level of Independence: Independent         Comments: Reports only used cane a week prior to admission due to progressive weakness. Works as Physiological scientist, Indepedent in all daily tasks. Has 3 cats, enjoys 8s rock/country music and watching movies     Hand Dominance   Dominant Hand: Right    Extremity/Trunk Assessment   Upper Extremity Assessment Upper Extremity Assessment: Generalized weakness    Lower Extremity Assessment Lower Extremity Assessment: Generalized weakness RLE Deficits / Details: bil feet internal rotation baseline for him.    Cervical / Trunk Assessment Cervical / Trunk Assessment: Kyphotic (pt reports walking hunched over is his baseline)  Communication   Communication: No difficulties  Cognition Arousal/Alertness: Awake/alert Behavior During Therapy: WFL for tasks assessed/performed Overall Cognitive Status: Within Functional Limits for tasks assessed                                 General Comments: appears to be his baseline      General Comments      Exercises     Assessment/Plan    PT Assessment Patient needs continued PT services  PT Problem List Decreased strength;Decreased activity tolerance;Decreased balance;Decreased mobility;Decreased knowledge of use of DME;Decreased knowledge of precautions;Pain       PT Treatment Interventions DME instruction;Gait training;Stair training;Functional mobility training;Therapeutic activities;Therapeutic exercise;Balance training;Patient/family education    PT Goals (Current goals can be found in the Care Plan section)  Acute Rehab PT Goals Patient Stated Goal: to walk without the walker PT Goal Formulation: With patient Time For Goal Achievement: 12/14/20 Potential to Achieve Goals: Good    Frequency Min 3X/week    Barriers to discharge        Co-evaluation               AM-PAC PT "6 Clicks" Mobility  Outcome Measure Help needed turning from your back to your side while in a flat bed without using bedrails?: A Little Help needed moving from lying on your back to sitting on the side of a flat bed without using bedrails?: A Little Help needed moving to and from a bed to a chair (including a wheelchair)?: A Little Help needed standing up from a chair using your arms (e.g., wheelchair or bedside chair)?: A Little Help needed to walk in hospital room?: A Little Help needed climbing 3-5 steps with a railing? : A Little 6 Click Score: 18    End of Session   Activity Tolerance: Patient limited by pain Patient left: in chair;with call bell/phone within reach;with nursing/sitter in room Nurse Communication: Mobility status PT Visit Diagnosis: Muscle weakness (generalized) (M62.81);Difficulty in walking, not elsewhere classified (R26.2);Pain Pain - Right/Left:  (incisional) Pain - part of body:  (abdomen)    Time: 3329-5188 PT Time Calculation (min) (ACUTE ONLY): 20 min   Charges:   PT Evaluation $PT Re-evaluation: 1 Re-eval  Verdene Lennert, PT, DPT  Acute Rehabilitation [phone] pager [phone] office

## 2020-11-30 NOTE — Progress Notes (Signed)
Noted JP drain to be putting out more serosanguineous drainage 80 ml and 90 ml in 30 minute interval.

## 2020-12-01 ENCOUNTER — Inpatient Hospital Stay (HOSPITAL_COMMUNITY): Payer: Self-pay

## 2020-12-01 DIAGNOSIS — K631 Perforation of intestine (nontraumatic): Secondary | ICD-10-CM

## 2020-12-01 DIAGNOSIS — E440 Moderate protein-calorie malnutrition: Secondary | ICD-10-CM | POA: Insufficient documentation

## 2020-12-01 LAB — BASIC METABOLIC PANEL
Anion gap: 11 (ref 5–15)
BUN: 20 mg/dL (ref 6–20)
CO2: 30 mmol/L (ref 22–32)
Calcium: 9.1 mg/dL (ref 8.9–10.3)
Chloride: 91 mmol/L — ABNORMAL LOW (ref 98–111)
Creatinine, Ser: 0.9 mg/dL (ref 0.61–1.24)
GFR, Estimated: >60 mL/min (ref >60)
Glucose, Bld: 144 mg/dL — ABNORMAL HIGH (ref 70–99)
Potassium: 3.6 mmol/L (ref 3.5–5.1)
Sodium: 132 mmol/L — ABNORMAL LOW (ref 135–145)

## 2020-12-01 LAB — CBC WITH DIFFERENTIAL/PLATELET
Abs Immature Granulocytes: 0.08 10*3/uL — ABNORMAL HIGH (ref 0.00–0.07)
Basophils Absolute: 0.2 10*3/uL — ABNORMAL HIGH (ref 0.0–0.1)
Basophils Relative: 1 %
Eosinophils Absolute: 0 10*3/uL (ref 0.0–0.5)
Eosinophils Relative: 0 %
HCT: 31.2 % — ABNORMAL LOW (ref 39.0–52.0)
Hemoglobin: 9.4 g/dL — ABNORMAL LOW (ref 13.0–17.0)
Immature Granulocytes: 1 %
Lymphocytes Relative: 9 %
Lymphs Abs: 1.4 10*3/uL (ref 0.7–4.0)
MCH: 23.4 pg — ABNORMAL LOW (ref 26.0–34.0)
MCHC: 30.1 g/dL (ref 30.0–36.0)
MCV: 77.8 fL — ABNORMAL LOW (ref 80.0–100.0)
Monocytes Absolute: 1.2 10*3/uL — ABNORMAL HIGH (ref 0.1–1.0)
Monocytes Relative: 8 %
Neutro Abs: 12.4 10*3/uL — ABNORMAL HIGH (ref 1.7–7.7)
Neutrophils Relative %: 81 %
Platelets: 896 10*3/uL — ABNORMAL HIGH (ref 150–400)
RBC: 4.01 MIL/uL — ABNORMAL LOW (ref 4.22–5.81)
WBC: 15.2 10*3/uL — ABNORMAL HIGH (ref 4.0–10.5)
nRBC: 0 % (ref 0.0–0.2)

## 2020-12-01 LAB — LACTIC ACID, PLASMA: Lactic Acid, Venous: 1.1 mmol/L (ref 0.5–1.9)

## 2020-12-01 LAB — GLUCOSE, CAPILLARY
Glucose-Capillary: 128 mg/dL — ABNORMAL HIGH (ref 70–99)
Glucose-Capillary: 129 mg/dL — ABNORMAL HIGH (ref 70–99)
Glucose-Capillary: 136 mg/dL — ABNORMAL HIGH (ref 70–99)
Glucose-Capillary: 139 mg/dL — ABNORMAL HIGH (ref 70–99)
Glucose-Capillary: 141 mg/dL — ABNORMAL HIGH (ref 70–99)

## 2020-12-01 LAB — MAGNESIUM: Magnesium: 2.2 mg/dL (ref 1.7–2.4)

## 2020-12-01 LAB — PHOSPHORUS: Phosphorus: 4.5 mg/dL (ref 2.5–4.6)

## 2020-12-01 LAB — HEMOGLOBIN A1C
Hgb A1c MFr Bld: 5.2 % (ref 4.8–5.6)
Mean Plasma Glucose: 102.54 mg/dL

## 2020-12-01 MED ORDER — METHOCARBAMOL 1000 MG/10ML IJ SOLN
1000.0000 mg | Freq: Three times a day (TID) | INTRAVENOUS | Status: DC
  Administered 2020-12-02 – 2020-12-03 (×4): 1000 mg via INTRAVENOUS
  Filled 2020-12-01 (×5): qty 10

## 2020-12-01 MED ORDER — TRAVASOL 10 % IV SOLN
INTRAVENOUS | Status: AC
  Filled 2020-12-01: qty 600

## 2020-12-01 MED ORDER — SODIUM CHLORIDE 0.9 % IV SOLN: INTRAVENOUS | Status: AC

## 2020-12-01 MED ORDER — METOPROLOL TARTRATE 5 MG/5ML IV SOLN: 5.0000 mg | Freq: Four times a day (QID) | INTRAVENOUS | Status: DC | PRN

## 2020-12-01 MED ORDER — ONDANSETRON HCL 4 MG PO TABS
4.0000 mg | ORAL_TABLET | Freq: Four times a day (QID) | ORAL | Status: DC | PRN
  Administered 2020-12-06 – 2020-12-07 (×2): 4 mg via ORAL
  Filled 2020-12-01 (×2): qty 1

## 2020-12-01 MED ORDER — PANTOPRAZOLE SODIUM 40 MG IV SOLR
40.0000 mg | Freq: Two times a day (BID) | INTRAVENOUS | Status: DC
  Administered 2020-12-01 – 2020-12-10 (×20): 40 mg via INTRAVENOUS
  Filled 2020-12-01 (×20): qty 40

## 2020-12-01 MED ORDER — POTASSIUM CHLORIDE CRYS ER 20 MEQ PO TBCR
40.0000 meq | EXTENDED_RELEASE_TABLET | Freq: Once | ORAL | Status: AC
  Administered 2020-12-01: 40 meq via ORAL
  Filled 2020-12-01: qty 2

## 2020-12-01 MED ORDER — INSULIN ASPART 100 UNIT/ML ~~LOC~~ SOLN
0.0000 [IU] | SUBCUTANEOUS | Status: DC
  Administered 2020-12-01 (×2): 2 [IU] via SUBCUTANEOUS
  Administered 2020-12-02: 3 [IU] via SUBCUTANEOUS
  Administered 2020-12-02 – 2020-12-03 (×2): 2 [IU] via SUBCUTANEOUS
  Administered 2020-12-03 (×2): 3 [IU] via SUBCUTANEOUS
  Administered 2020-12-03: 2 [IU] via SUBCUTANEOUS
  Administered 2020-12-03: 3 [IU] via SUBCUTANEOUS
  Administered 2020-12-03 – 2020-12-07 (×19): 2 [IU] via SUBCUTANEOUS
  Administered 2020-12-08: 3 [IU] via SUBCUTANEOUS
  Administered 2020-12-08 (×3): 2 [IU] via SUBCUTANEOUS
  Administered 2020-12-08: 3 [IU] via SUBCUTANEOUS
  Administered 2020-12-09: 2 [IU] via SUBCUTANEOUS

## 2020-12-01 MED ORDER — ONDANSETRON HCL 4 MG/2ML IJ SOLN
4.0000 mg | Freq: Once | INTRAMUSCULAR | Status: AC
  Administered 2020-12-04: 4 mg via INTRAVENOUS
  Filled 2020-12-01: qty 2

## 2020-12-01 MED ORDER — SODIUM CHLORIDE 0.9 % IV SOLN
8.0000 mg | Freq: Four times a day (QID) | INTRAVENOUS | Status: DC | PRN
  Administered 2020-12-01: 8 mg via INTRAVENOUS
  Filled 2020-12-01: qty 4

## 2020-12-01 MED ORDER — LACTATED RINGERS IV BOLUS
1000.0000 mL | Freq: Once | INTRAVENOUS | Status: AC
  Administered 2020-12-01: 1000 mL via INTRAVENOUS

## 2020-12-01 MED ORDER — LIP MEDEX EX OINT
1.0000 "application " | TOPICAL_OINTMENT | Freq: Two times a day (BID) | CUTANEOUS | Status: DC
  Administered 2020-12-01 – 2020-12-15 (×28): 1 via TOPICAL
  Filled 2020-12-01: qty 7

## 2020-12-01 NOTE — Progress Notes (Signed)
PROGRESS NOTE    Eric [name]  ZOX:[id number] DOB: 09-May-1971 DOA: 11/15/2020 PCP: [facility]    Chief Complaint  Patient presents with  . Groin Pain    Brief Narrative:  Eric [name] is a 50 y.o. male no known medical history. Patient presented secondary to right groin pain and found to have a retroperitoneal abscess/bowel perforation/cecal mass concerning for neoplasm. Incidental finding of COVID-19 positive status. General surgery consulted , with poor oral intake, protein calorie malnutrition, started on TPN, had IR drain of his retroperitoneal abscess, improving on IV Zosyn, biopsy of liver mass showed metastasis of primary colon adenocarcinoma.  Subjective:  POD#3, vomited x3 ( black emesis) midnight , some dark watery output in ileostomy  reports being " sore", walked with PT yesterday  no fever     Assessment & Plan:   Principal Problem:   Retroperitoneal abscess (HCC) Active Problems:   Colonic mass   Iron deficiency anemia due to chronic blood loss   Sepsis ([place])   COVID-19 virus infection   Thrombocytosis   Hyponatremia   Protein-calorie malnutrition, severe (HCC)   Liver mass, right lobe   Right groin pain   Ambulates with cane   Tobacco abuse   Abdominal pain, RLQ (right lower quadrant)   Fatigue   Symptomatic anemia   Malnutrition of moderate degree   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)   Sepsis present on admission due to perforated colon adenocarcinoma with retroperitoneal abscess -Status post IR drain, abscess culture positive for Enterobacter,Blood culture no growth,  -s/p  URo stent placement and ex lap with right colectomy , ileostomy creation on February 17 -WBC 21.4 on presentation, WBC nadir to 8.2, still fluctuating, he is currently on Zosyn, last dose scheduled on 2/22 --Plan per general surgery  Postop ileus -NG placed on low wall intermittent suction -TPN started  Blood loss anemia, present on  admission - -Hemoglobin 5.4 on presentation, anemia likely from chronic GI bleed from colon cancer -Received PRBC transfusion x4 units -Hemoglobin has been above 9 for last few days  Thrombocytosis Likely reactive Platelet 1165 on presentation, platelet nadir to 773, still fluctuating  Hyponatremia -Has been hyponatremia since admission , sodium 129 on presentation ,sodium nadir at 124, today 132 -He does not appear symptomatic, clinically he is dry, however urine sodium is 148 -received sodium tablet supplement per Tube, no on ng suction and TPN  Elevated fasting blood glucose - A1c 5.2 -On SSI   COVID-19 infection - Incidental. Asymptomatic. On room air.  Treated with 3 days of remdesivir -He can come off isolation on February 15  Nutritional Assessment: The patient's BMI is: Body mass index is 20.75 kg/m.Marland Kitchen Seen by dietician.  I agree with the assessment and plan as outlined below: Nutrition Status: Nutrition Problem: Moderate Malnutrition Etiology:  (illness) Signs/Symptoms: severe muscle depletion,moderate fat depletion,moderate muscle depletion Interventions: Ensure Enlive (each supplement provides 350kcal and 20 grams of protein),TPN    Tobacco abuse -Smoking cessation education provided, continue nicotine patch    Unresulted Labs (From admission, onward)          Start     Ordered   12/02/20 0000  Prealbumin  Weekly,   R     Question:  Specimen collection method  Answer:  IV Team=IV Team collect   11/25/20 1234   12/01/20 0500  CBC  Daily,   R     Question:  Specimen collection method  Answer:  IV Team=IV Team collect  11/30/20 0916   12/01/20 1610  Basic metabolic panel  Daily,   R     Question:  Specimen collection method  Answer:  IV Team=IV Team collect   11/30/20 0916             DVT prophylaxis: heparin injection 5,000 Units Start: 11/29/20 0800 SCDs Start: 11/15/20 2135   Code Status: Full Family Communication: Sister at bedside on  2/16 Disposition:   Status is: Inpatient  Dispo: The patient is from: Home              Anticipated d/c is to: To be determined              Anticipated d/c date is:  Patient currently not ready to discharge  Consultants:   General surgery  Palliative care  Oncology Dr. Benay Spice  Interventional radiology  Procedures:    PICC line placement February 7  Retroperitoneal abscess drain placement on February 7 s/p  URo stent placement and ex lap with right colectomy , ileostomy creation on February 17   Antimicrobials:   Anti-infectives (From admission, onward)   Start     Dose/Rate Route Frequency Ordered Stop   11/30/20 1045  ceFAZolin (ANCEF) IVPB 2g/100 mL premix  Status:  Discontinued        2 g 200 mL/hr over 30 Minutes Intravenous On call to O.R. 11/30/20 0957 11/30/20 1043   11/16/20 1000  remdesivir 100 mg in sodium chloride 0.9 % 100 mL IVPB       "Followed by" Linked Group Details   100 mg 200 mL/hr over 30 Minutes Intravenous Daily 11/15/20 2135 11/17/20 1002   11/15/20 2230  piperacillin-tazobactam (ZOSYN) IVPB 3.375 g        3.375 g 12.5 mL/hr over 240 Minutes Intravenous Every 8 hours 11/15/20 2144     11/15/20 2134  remdesivir 200 mg in sodium chloride 0.9% 250 mL IVPB       "Followed by" Linked Group Details   200 mg 580 mL/hr over 30 Minutes Intravenous Once 11/15/20 2135 11/16/20 0029   11/15/20 1700  piperacillin-tazobactam (ZOSYN) IVPB 3.375 g        3.375 g 100 mL/hr over 30 Minutes Intravenous  Once 11/15/20 1657 11/15/20 1826          Objective: Vitals:   11/30/20 2146 12/01/20 0103 12/01/20 0128 12/01/20 0453  BP: (!) 132/101 (!) 128/105 (!) 144/98 (!) 152/102  Pulse: 97 (!) 106 99 100  Resp: 16 18  16   Temp: 98.9 F (37.2 C) 98 F (36.7 C)  98.2 F (36.8 C)  TempSrc: Oral Oral  Oral  SpO2: 100% 98%  99%  Weight:      Height:        Intake/Output Summary (Last 24 hours) at 12/01/2020 0736 Last data filed at 12/01/2020  0659 Gross per 24 hour  Intake 2306.87 ml  Output 2455 ml  Net -148.13 ml   Filed Weights   11/15/20 2000  Weight: 69.4 kg    Examination:  General exam: Thin ,calm, NAD Respiratory system: Clear to auscultation. Respiratory effort normal. Cardiovascular system: S1 & S2 heard, RRR. No JVD, no murmur, No pedal edema. Gastrointestinal system: Postop changes, dressing intact, decreased bowel sounds, positive drains, + ieostomy with pick stoma Central nervous system: Alert and oriented. No focal neurological deficits. Extremities: Symmetric 5 x 5 power. Skin: No rashes, lesions or ulcers Psychiatry: Judgement and insight appear normal. Mood & affect appropriate.     Data  Reviewed: I have personally reviewed following labs and imaging studies  CBC: Recent Labs  Lab 11/27/20 0341 11/28/20 0338 11/29/20 0352 11/30/20 0315 12/01/20 0345  WBC 14.1* 8.2 13.6* 17.5* 15.2*  NEUTROABS  --   --   --  14.5* 12.4*  HGB 9.1* 9.2* 9.6* 9.9* 9.4*  HCT 28.9* 29.5* 31.1* 33.2* 31.2*  MCV 75.1* 76.6* 76.0* 77.0* 77.8*  PLT 831* 773* 840* 860* 896*    Basic Metabolic Panel: Recent Labs  Lab 11/25/20 0304 11/26/20 0311 11/27/20 0341 11/28/20 0338 11/29/20 0352 11/30/20 0315 12/01/20 0345  NA 130*   < > 129* 131* 132* 132* 132*  K 4.5   < > 4.5 4.6 4.6 4.2 3.6  CL 96*   < > 95* 97* 97* 95* 91*  CO2 25   < > 24 25 25 28 30   GLUCOSE 110*   < > 102* 109* 126* 156* 144*  BUN 20   < > 16 15 16 18 20   CREATININE 0.72   < > 0.78 0.81 0.83 0.76 0.90  CALCIUM 8.9   < > 8.9 8.9 9.2 8.9 9.1  MG 2.4  --   --  2.3  --   --   --   PHOS 5.7*  --   --   --   --   --   --    < > = values in this interval not displayed.    GFR: Estimated Creatinine Clearance: 95.3 mL/min (by C-G formula based on SCr of 0.9 mg/dL).  Liver Function Tests: Recent Labs  Lab 11/25/20 0304 11/26/20 0311 11/27/20 0341  AST 34 25 20  ALT 43 38 29  ALKPHOS 109 126 103  BILITOT 0.6 0.4 0.7  PROT 6.7 6.4*  6.2*  ALBUMIN 2.2* 2.2* 2.2*    CBG: Recent Labs  Lab 11/29/20 2046 11/30/20 0740 11/30/20 1200 11/30/20 1716 11/30/20 2149  GLUCAP 141* 105* 147* 146* 142*     Recent Results (from the past 240 hour(s))  Surgical pcr screen     Status: None   Collection Time: 11/27/20 12:36 AM   Specimen: Nasal Mucosa; Nasal Swab  Result Value Ref Range Status   MRSA, PCR NEGATIVE NEGATIVE Final   Staphylococcus aureus NEGATIVE NEGATIVE Final    Comment: (NOTE) The Xpert SA Assay (FDA approved for NASAL specimens in patients 67 years of age and older), is one component of a comprehensive surveillance program. It is not intended to diagnose infection nor to guide or monitor treatment. Performed at [facility], [place] [address] [place], [place] [address]          Radiology Studies: No results found.      Scheduled Meds: . acetaminophen  1,000 mg Oral Q6H  . Chlorhexidine Gluconate Cloth  6 each Topical Daily  . feeding supplement  237 mL Oral QID  . heparin injection (subcutaneous)  5,000 Units Subcutaneous Q8H  . influenza vac split quadrivalent PF  0.5 mL Intramuscular Tomorrow-1000  . insulin aspart  0-15 Units Subcutaneous TID WC  . insulin aspart  0-5 Units Subcutaneous QHS  . lidocaine  1 patch Transdermal Daily  . lip balm  1 application Topical BID  . nicotine  21 mg Transdermal Daily  . ondansetron (ZOFRAN) IV  4 mg Intravenous Once  . polycarbophil  625 mg Oral BID  . sodium chloride flush  10-40 mL Intracatheter Q12H  . sodium chloride flush  5 mL Intracatheter Q8H  . sodium chloride  1 g Oral BID WC   Continuous Infusions: . sodium chloride 50 mL/hr at 12/01/20 0659  . famotidine (PEPCID) IV 20 mg (11/30/20 1055)  . lactated ringers    . methocarbamol (ROBAXIN) IV 1,000 mg (12/01/20 0554)  . ondansetron [facility]) IV 8 mg (12/01/20 0152)  . piperacillin-tazobactam (ZOSYN)  IV 3.375 g (12/01/20 0553)     LOS: 16 days   Time spent: 52mins, case  discussed with general surgery Greater than 50% of this time was spent in counseling, explanation of diagnosis, planning of further management, and coordination of care.   Voice Recognition Vivian[employer] Spare dictation system was used to create this note, attempts have been made to correct errors. Please contact the author with questions and/or clarifications.   Florencia Reasons, MD PhD FACP [facility]  Available via Epic secure chat 7am-7pm for nonurgent issues Please page for urgent issues To page the attending provider between 7A-7P or the covering provider during after hours 7P-7A, please log into the web site [email or web address] and access using universal [place] password for that web site. If you do not have the password, please call the hospital operator.    12/01/2020, 7:36 AM

## 2020-12-01 NOTE — Progress Notes (Signed)
Eric [name] [id number] 05-16-1971  CARE TEAM:  PCP: [facility], [facility]  Outpatient Care Team: Patient Care Team: [facility] as PCP - General (Family Medicine) Michael Boston, MD as Consulting Physician (Colon and Rectal Surgery) Armbruster, Carlota Raspberry, MD as Consulting Physician (Gastroenterology) Ladell Pier, MD as Consulting Physician (Oncology)  Inpatient Treatment Team: Treatment Team: Attending Provider: Florencia Reasons, MD; Rounding Team: Edison Pace, Md, MD; Consulting Physician: Michael Boston, MD; Consulting Physician: Ladell Pier, MD; Nurse Practitioner: Maryanna Shape, NP; Utilization Review: Orlean Bradford, RN; Technician: Gasper Sells, NT; Rounding Team: Kelvin Cellar, MD; Technician: Marita [place], [place]; Registered Nurse: Salley Slaughter, RN; Registered Nurse: Janalee Dane, RN; Registered Nurse: Weyman Pedro, RN   Problem List:   Principal Problem:   Perforated colon cancer s/p right colectomy/ileostomy 11/28/2020 Active Problems:   Colonic mass   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 [facility])   Retroperitoneal abscess ([place])   Protein-calorie malnutrition, severe ([place])   Iron deficiency anemia due to chronic blood loss   Sepsis ([place])   COVID-19 virus infection   Thrombocytosis   Hyponatremia   Liver mass, right lobe   Right groin pain   Ambulates with cane   Tobacco abuse   Abdominal pain, RLQ (right lower quadrant)   Fatigue   Symptomatic anemia   3 Days Post-Op  11/28/2020  POST-OPERATIVE DIAGNOSIS:  ADENOCARCINOMA OF COLON  PROCEDURE:  Procedure(s) with comments: EXPLORATORY LAPAROTOMY (N/A) - DOW CASE TO START AT 730AM 4 HOUR CASE RIGHT COLECTOMY (Right) ILEOSTOMY CREATION (N/A) CYSTOSCOPY WITH STENT PLACEMENT (N/A)  SURGEON:  Surgeon(s) and Role: Panel 1:    Ralene Ok, MD - Primary   Assessment  FAIR with worsening ileus  [facility] Stay = 16  days)  Assessment/Plan Anemia Hyponatremia Tobacco abuse COVID-19positive  Severe malnutrition - prealbumin 5.8 (2/8), continue TPN  Colonmass with perforationand RTP abscess Stage IV adenocarcinomacolon  S/P EXPLORATORY LAPAROTOMY, RIGHT COLECTOMY, END ILEOSTOMY CREATION, CYSTOSCOPY W/ STENT PLACEMENT 11/28/20 (Dr. Rosendo Gros; Dr. Abner Greenspan)  - s/p IR drain placement 2/7 - this drain was removed intra-operatively and new blake drain placed; IR cx Enterobacter aerogenes - continue drain and IV zosyn x 5d postop - NGT with emesis again - PRN analgesics and antiemetics  - OOB/IS  - restart TNA until ileus resolved  Liver metastasis  - x3 noted on MRI, unsure if abscess vs metastatic disease.IR liver biopsy 11/20/2020; -A. LIVER, RIGHT MASS, NEEDLE CORE BIOPSY:  - Adenocarcinoma.  COMMENT:  The morphology suggests metastatic colorectal adenocarcinoma.  Immunohistochemistry will be performed and reported as an addendum.  There is sufficient tissue for additional testing.   PATIENT WITH NEED PORT-A -CATH.  Will try to do prior to d/c next week  ID -zosyn 2/4>>stop POD#5 - 2/22 FEN -NPO - TNA restart 2/20- VTE: SQ heparin Foley -out GI : IV PPI inc to bid from H2B given prob hematesis - follow Hgb HTN: PRN B blocker & ACE Inh       30 minutes spent in review, evaluation, examination, counseling, and coordination of care.   I have reviewed this patient's available data, including medical history, events of note, physical examination and test results as part of my evaluation.  A significant portion of that time was spent in counseling.  Care during the described time interval was provided by me.  12/01/2020    Subjective: (Chief complaint)  N/v - more emesis - dark Bloated Denies much pain  RN in room   Objective:  Vital signs:  Vitals:   11/30/20 2146 12/01/20 0103 12/01/20 0128 12/01/20 0453  BP: (!) 132/101 (!) 128/105 (!) 144/98 (!) 152/102  Pulse: 97  (!) 106 99 100  Resp: 16 18  16   Temp: 98.9 F (37.2 C) 98 F (36.7 C)  98.2 F (36.8 C)  TempSrc: Oral Oral  Oral  SpO2: 100% 98%  99%  Weight:      Height:        Last BM Date: 11/29/20  Intake/Output   Yesterday:  02/19 0701 - 02/20 0700 In: 2306.9 [P.O.:840; I.V.:904.5; IV Piggyback:562.4] Out: 4401 [Urine:750; Emesis/NG output:1200; Drains:430; Stool:75] This shift:  No intake/output data recorded.  Bowel function:  Flatus: No  BM:  No  Drain: Serous   Physical Exam:  General: Pt awake/alert in no acute distress.  Remains cachectic  Eyes: PERRL, normal EOM.  Sclera clear.  No icterus Neuro: CN II-XII intact w/o focal sensory/motor deficits. Lymph: No head/neck/groin lymphadenopathy Psych:  No delerium/psychosis/paranoia.  Oriented x 4 HENT: Normocephalic, Mucus membranes moist.  No thrush Neck: Supple, No tracheal deviation.  No obvious thyromegaly Chest: No pain to chest wall compression.  Good respiratory excursion.  No audible wheezing CV:  Pulses intact.  Regular rhythm.  No major extremity edema MS: Normal AROM mjr joints.  No obvious deformity  Abdomen: Somewhat firm.  Moderately distended.  Mildly tender at incisions only.  Ileostomy pink w scant black coffee ground outpt & no flatus No evidence of peritonitis.  No incarcerated hernias.  Ext:  No deformity.  No mjr edema.  No cyanosis Skin: No petechiae / purpurea.  No major sores.  Warm and dry    Results:   Cultures: Recent Results (from the past 720 hour(s))  SARS Coronavirus 2 by RT PCR (hospital order, performed in [facility] hospital lab) Nasopharyngeal Nasopharyngeal Swab     Status: Abnormal   Collection Time: 11/15/20 12:41 PM   Specimen: Nasopharyngeal Swab  Result Value Ref Range Status   SARS Coronavirus 2 POSITIVE (A) NEGATIVE Final    Comment: RESULT CALLED TO, READ BACK BY AND VERIFIED WITH: E SHAFFER RN 1352 11/15/20 A BROWNING (NOTE) SARS-CoV-2 target nucleic acids are  DETECTED  SARS-CoV-2 RNA is generally detectable in upper respiratory specimens  during the acute phase of infection.  Positive results are indicative  of the presence of the identified virus, but do not rule out bacterial infection or co-infection with other pathogens not detected by the test.  Clinical correlation with patient history and  other diagnostic information is necessary to determine patient infection status.  The expected result is negative.  Fact Sheet for Patients:   StrictlyIdeas.no   Fact Sheet for Healthcare Providers:   BankingDealers.co.za    This test is not yet approved or cleared by the [place] FDA and  has been authorized for detection and/or diagnosis of SARS-CoV-2 by FDA under an Emergency Use Authorization (EUA).  This EUA will remain in effect (meaning this t est can be used) for the duration of  the COVID-19 declaration under Section 564(b)(1) of the Act, 21 U.S.C. section 360-bbb-3(b)(1), unless the authorization is terminated or revoked sooner.  Performed at [facility], [place] [address], [place], [place] [address]   Blood Culture (routine x 2)     Status: None   Collection Time: 11/15/20  1:52 PM   Specimen: BLOOD RIGHT FOREARM  Result Value Ref Range Status   Specimen Description  BLOOD RIGHT FOREARM  Final   Special Requests   Final    BOTTLES DRAWN AEROBIC AND ANAEROBIC Blood Culture adequate volume   Culture   Final    NO GROWTH 5 DAYS Performed at [facility], [address], [place], [place] [address]    Report Status 11/20/2020 FINAL  Final  Blood Culture (routine x 2)     Status: None   Collection Time: 11/15/20  2:13 PM   Specimen: BLOOD LEFT FOREARM  Result Value Ref Range Status   Specimen Description BLOOD LEFT FOREARM  Final   Special Requests   Final    BOTTLES DRAWN AEROBIC AND ANAEROBIC Blood Culture adequate volume   Culture   Final    NO GROWTH 5  DAYS Performed at [facility], [place] [address], [place], [place] [address]    Report Status 11/20/2020 FINAL  Final  Aerobic/Anaerobic Culture (surgical/deep wound)     Status: None   Collection Time: 11/18/20  4:34 PM   Specimen: Abscess  Result Value Ref Range Status   Specimen Description ABSCESS  Final   Special Requests ABDOMEN  Final   Gram Stain   Final    FEW WBC PRESENT, PREDOMINANTLY PMN MODERATE GRAM NEGATIVE RODS FEW GRAM POSITIVE COCCI IN PAIRS IN CHAINS FEW GRAM POSITIVE RODS    Culture   Final    FEW ENTEROBACTER AEROGENES MODERATE STREPTOCOCCUS GROUP F Beta hemolytic streptococci are predictably susceptible to penicillin and other beta lactams. Susceptibility testing not routinely performed. ABUNDANT BACTEROIDES FRAGILIS BETA LACTAMASE POSITIVE Performed at [facility], [place] [address], [place], [place] [address]    Report Status 11/21/2020 FINAL  Final   Organism ID, Bacteria ENTEROBACTER AEROGENES  Final      Susceptibility   Enterobacter aerogenes - MIC*    CEFAZOLIN >=64 RESISTANT Resistant     CEFEPIME <=0.12 SENSITIVE Sensitive     CEFTAZIDIME <=1 SENSITIVE Sensitive     CEFTRIAXONE <=0.25 SENSITIVE Sensitive     CIPROFLOXACIN <=0.25 SENSITIVE Sensitive     GENTAMICIN <=1 SENSITIVE Sensitive     IMIPENEM 0.5 SENSITIVE Sensitive     TRIMETH/SULFA <=20 SENSITIVE Sensitive     PIP/TAZO <=4 SENSITIVE Sensitive     * FEW ENTEROBACTER AEROGENES  Aerobic/Anaerobic Culture (surgical/deep wound)     Status: None   Collection Time: 11/20/20  4:22 PM   Specimen: Biopsy; Tissue  Result Value Ref Range Status   Specimen Description BIOPSY LIVER  Final   Special Requests Normal  Final   Gram Stain   Final    RARE WBC PRESENT, PREDOMINANTLY PMN NO ORGANISMS SEEN    Culture   Final    No growth aerobically or anaerobically. Performed at [facility], [place] [address] [place], [place] [address]    Report Status 11/25/2020 FINAL  Final   Surgical pcr screen     Status: None   Collection Time: 11/27/20 12:36 AM   Specimen: Nasal Mucosa; Nasal Swab  Result Value Ref Range Status   MRSA, PCR NEGATIVE NEGATIVE Final   Staphylococcus aureus NEGATIVE NEGATIVE Final    Comment: (NOTE) The Xpert SA Assay (FDA approved for NASAL specimens in patients 38 years of age and older), is one component of a comprehensive surveillance program. It is not intended to diagnose infection nor to guide or monitor treatment. Performed at [facility], [place] [address], [place], [place] [address]     Labs: Results for orders  placed or performed during the hospital encounter of 11/15/20 (from the past 48 hour(s))  Glucose, capillary     Status: Abnormal   Collection Time: 11/29/20 11:10 AM  Result Value Ref Range   Glucose-Capillary 132 (H) 70 - 99 mg/dL    Comment: Glucose reference range applies only to samples taken after fasting for at least 8 hours.  Glucose, capillary     Status: Abnormal   Collection Time: 11/29/20  4:09 PM  Result Value Ref Range   Glucose-Capillary 137 (H) 70 - 99 mg/dL    Comment: Glucose reference range applies only to samples taken after fasting for at least 8 hours.  Glucose, capillary     Status: Abnormal   Collection Time: 11/29/20  8:46 PM  Result Value Ref Range   Glucose-Capillary 141 (H) 70 - 99 mg/dL    Comment: Glucose reference range applies only to samples taken after fasting for at least 8 hours.  CBC with Differential/Platelet     Status: Abnormal   Collection Time: 11/30/20  3:15 AM  Result Value Ref Range   WBC 17.5 (H) 4.0 - 10.5 K/uL   RBC 4.31 4.22 - 5.81 MIL/uL   Hemoglobin 9.9 (L) 13.0 - 17.0 g/dL   HCT 33.2 (L) 39.0 - 52.0 %   MCV 77.0 (L) 80.0 - 100.0 fL   MCH 23.0 (L) 26.0 - 34.0 pg   MCHC 29.8 (L) 30.0 - 36.0 g/dL   RDW Not Measured 11.5 - 15.5 %   Platelets 860 (H) 150 - 400 K/uL    Comment: REPEATED TO VERIFY   nRBC 0.0 0.0 - 0.2 %   Neutrophils Relative % 82 %    Neutro Abs 14.5 (H) 1.7 - 7.7 K/uL   Lymphocytes Relative 8 %   Lymphs Abs 1.4 0.7 - 4.0 K/uL   Monocytes Relative 7 %   Monocytes Absolute 1.2 (H) 0.1 - 1.0 K/uL   Eosinophils Relative 1 %   Eosinophils Absolute 0.1 0.0 - 0.5 K/uL   Basophils Relative 1 %   Basophils Absolute 0.1 0.0 - 0.1 K/uL   Immature Granulocytes 1 %   Abs Immature Granulocytes 0.14 (H) 0.00 - 0.07 K/uL    Comment: Performed at [facility], [address], [place], [place] [address]  Basic metabolic panel     Status: Abnormal   Collection Time: 11/30/20  3:15 AM  Result Value Ref Range   Sodium 132 (L) 135 - 145 mmol/L   Potassium 4.2 3.5 - 5.1 mmol/L   Chloride 95 (L) 98 - 111 mmol/L   CO2 28 22 - 32 mmol/L   Glucose, Bld 156 (H) 70 - 99 mg/dL    Comment: Glucose reference range applies only to samples taken after fasting for at least 8 hours.   BUN 18 6 - 20 mg/dL   Creatinine, Ser 0.76 0.61 - 1.24 mg/dL   Calcium 8.9 8.9 - 10.3 mg/dL   GFR, Estimated >60 >60 mL/min    Comment: (NOTE) Calculated using the CKD-EPI Creatinine Equation (2021)    Anion gap 9 5 - 15    Comment: Performed at [facility] [address], [place], [place] [address]  Glucose, capillary     Status: Abnormal   Collection Time: 11/30/20  7:40 AM  Result Value Ref Range   Glucose-Capillary 105 (H) 70 - 99 mg/dL    Comment: Glucose reference range applies only to samples taken after fasting for at least 8 hours.  Glucose, capillary     Status: Abnormal   Collection Time: 11/30/20 12:00 PM  Result Value Ref Range   Glucose-Capillary 147 (H) 70 - 99 mg/dL    Comment: Glucose reference range applies only to samples taken after fasting for at least 8 hours.  Glucose, capillary     Status: Abnormal   Collection Time: 11/30/20  5:16 PM  Result Value Ref Range   Glucose-Capillary 146 (H) 70 - 99 mg/dL    Comment: Glucose reference range applies only to samples taken after fasting for at least 8 hours.  Glucose, capillary      Status: Abnormal   Collection Time: 11/30/20  9:49 PM  Result Value Ref Range   Glucose-Capillary 142 (H) 70 - 99 mg/dL    Comment: Glucose reference range applies only to samples taken after fasting for at least 8 hours.  Lactic acid, plasma     Status: None   Collection Time: 12/01/20  3:21 AM  Result Value Ref Range   Lactic Acid, Venous 1.1 0.5 - 1.9 mmol/L    Comment: Performed at [facility] [address], [place], [place] [address]  Basic metabolic panel     Status: Abnormal   Collection Time: 12/01/20  3:45 AM  Result Value Ref Range   Sodium 132 (L) 135 - 145 mmol/L   Potassium 3.6 3.5 - 5.1 mmol/L   Chloride 91 (L) 98 - 111 mmol/L   CO2 30 22 - 32 mmol/L   Glucose, Bld 144 (H) 70 - 99 mg/dL    Comment: Glucose reference range applies only to samples taken after fasting for at least 8 hours.   BUN 20 6 - 20 mg/dL   Creatinine, Ser 0.90 0.61 - 1.24 mg/dL   Calcium 9.1 8.9 - 10.3 mg/dL   GFR, Estimated >60 >60 mL/min    Comment: (NOTE) Calculated using the CKD-EPI Creatinine Equation (2021)    Anion gap 11 5 - 15    Comment: Performed at [facility] [address] [place], [place] [address]  Hemoglobin A1c     Status: None   Collection Time: 12/01/20  3:45 AM  Result Value Ref Range   Hgb A1c MFr Bld 5.2 4.8 - 5.6 %    Comment: (NOTE) Pre diabetes:          5.7%-6.4%  Diabetes:              >6.4%  Glycemic control for   <7.0% adults with diabetes    Mean Plasma Glucose 102.54 mg/dL    Comment: Performed at [facility] [address], [place], [place] [address]  CBC with Differential/Platelet     Status: Abnormal   Collection Time: 12/01/20  3:45 AM  Result Value Ref Range   WBC 15.2 (H) 4.0 - 10.5 K/uL   RBC 4.01 (L) 4.22 - 5.81 MIL/uL   Hemoglobin 9.4 (L) 13.0 - 17.0 g/dL   HCT 31.2 (L) 39.0 - 52.0 %   MCV 77.8 (L) 80.0 - 100.0 fL   MCH 23.4 (L) 26.0 - 34.0 pg   MCHC 30.1 30.0 - 36.0 g/dL   RDW Not Measured 11.5 - 15.5 %    Platelets 896 (H) 150 - 400 K/uL   nRBC 0.0 0.0 - 0.2 %   Neutrophils Relative % 81 %   Neutro Abs 12.4 (H) 1.7 - 7.7 K/uL   Lymphocytes Relative 9 %   Lymphs Abs 1.4 0.7 - 4.0 K/uL  Monocytes Relative 8 %   Monocytes Absolute 1.2 (H) 0.1 - 1.0 K/uL   Eosinophils Relative 0 %   Eosinophils Absolute 0.0 0.0 - 0.5 K/uL   Basophils Relative 1 %   Basophils Absolute 0.2 (H) 0.0 - 0.1 K/uL   Immature Granulocytes 1 %   Abs Immature Granulocytes 0.08 (H) 0.00 - 0.07 K/uL   Polychromasia PRESENT    Ovalocytes PRESENT     Comment: Performed at [facility] [address] [place], [place] [address]  Glucose, capillary     Status: Abnormal   Collection Time: 12/01/20  7:38 AM  Result Value Ref Range   Glucose-Capillary 141 (H) 70 - 99 mg/dL    Comment: Glucose reference range applies only to samples taken after fasting for at least 8 hours.    Imaging / Studies: No results found.  Medications / Allergies: per chart  Antibiotics: Anti-infectives (From admission, onward)   Start     Dose/Rate Route Frequency Ordered Stop   11/30/20 1045  ceFAZolin (ANCEF) IVPB 2g/100 mL premix  Status:  Discontinued        2 g 200 mL/hr over 30 Minutes Intravenous On call to O.R. 11/30/20 0957 11/30/20 1043   11/16/20 1000  remdesivir 100 mg in sodium chloride 0.9 % 100 mL IVPB       "Followed by" Linked Group Details   100 mg 200 mL/hr over 30 Minutes Intravenous Daily 11/15/20 2135 11/17/20 1002   11/15/20 2230  piperacillin-tazobactam (ZOSYN) IVPB 3.375 g        3.375 g 12.5 mL/hr over 240 Minutes Intravenous Every 8 hours 11/15/20 2144     11/15/20 2134  remdesivir 200 mg in sodium chloride 0.9% 250 mL IVPB       "Followed by" Linked Group Details   200 mg 580 mL/hr over 30 Minutes Intravenous Once 11/15/20 2135 11/16/20 0029   11/15/20 1700  piperacillin-tazobactam (ZOSYN) IVPB 3.375 g        3.375 g 100 mL/hr over 30 Minutes Intravenous  Once 11/15/20 1657 11/15/20 1826         Note: Portions of this report may have been transcribed using voice recognition software. Every effort was made to ensure accuracy; however, inadvertent computerized transcription errors may be present.   Any transcriptional errors that result from this process are unintentional.    Adin Hector, MD, FACS, MASCRS Gastrointestinal and Minimally Invasive Surgery  [facility] [address] [place], [place] [address] [phone] Fax [phone] Main/Paging  CONTACT INFORMATION: Weekday (9AM-5PM) concerns: Call [facility] main office at [phone] Weeknight (5PM-9AM) or Weekend/Holiday concerns: Check [email or web address] for General Surgery [facility] coverage (Please, do not use SecureChat as it is not reliable communication to operating surgeons for immediate patient care)      12/01/2020  8:00 AM

## 2020-12-01 NOTE — Progress Notes (Addendum)
Patient has vomitted x 3(black emesis). PRN Zofran & Compazine given -slightly effective.Also noted, dark watery output in the Ileostomy. V/S 144/98 PR98 Y98.0 RR18 02sat 98%/2L. Alert and verbal. Noberto Retort was made aware. New orders received. Will continue to close monitor patient.

## 2020-12-01 NOTE — Progress Notes (Signed)
PHARMACY - TOTAL PARENTERAL NUTRITION CONSULT NOTE   Indication: Prolonged ileus  Patient Measurements: Height: 6' (182.9 cm) Weight: 69.4 kg (153 lb) IBW/kg (Calculated) : 77.6 TPN AdjBW (KG): 69.4 Body mass index is 20.75 kg/m. Usual Weight: 170 lbs  Assessment: Eric [name] is a 50 y.o. male no known medical history. Patient presented secondary to right groin pain and found to have a retroperitoneal abscess/bowel perforation/cecal mass concerning for neoplasm. Incidental finding of COVID-19 positive status. Surgery following, no surgical interventions at this time. Plans for short term management with drains and IV abx. Plan for surgical resection of R colon/retroperitoneal mass ideally in 6 weeks for more optimal surgical conditions.   Patient is s/p colon resection on 2/17. On 2/19 he vomited 500 ml green emesis and vomited again 2/20. Noted to have worsening ileus. NGT placed. Pharmacy consulted to resume TPN for prolonged ileus.    Glucose / Insulin: No history of diabetes. A1c 5.2. CBGs <180. Utilized 4 units / 24 hrs.  Electrolytes: Na 132. K 3.6 (goal >=4) - replaced, Mg 2.2, Phos 4.5. Corrected Ca 10.5. Other electrolytes wnl.  Renal: Scr 0.9 - stable. BUN wnl.  LFTs / TGs: LFTs and Tbili WNL. TG 94 (2/8).  Prealbumin / albumin: Prealbumin 31.5. Albumin 2.2.  Intake / Output; MIVF: UOP 1 > 0.5 ml/kg/hr. NGT output 1200 ml. R JP drain 480 ml/24 hrs. Ileostomy output 75 ml.  GI Imaging:  2/4 CT abd: long segment masslike thickening on cecum concerning for neoplasm, R retroperitoneal abscess 2/9 MRI abd: lesion in peripheral R liver - possible intrahepatic abscess vs. Metastatic disease. Dec fluid in R iliopsoas.  Surgeries / Procedures:  2/7: R retroperitoneal drain placed, replaced in OR 2/17 217: ex-lap, right colectomy, end ileostomy, cystoscopy with stent placement  Central access: Triple lumen PICC 11/18/20 TPN start date: 11/18/20-11/24/20; resumed 2/20  Nutritional Goals  (per RD recommendation on 2/16): Kcal:  2200-2430; Protein:  110-125; Fluid:  >/= 2.2 L Goal TPN rate is 100 mL/hr (provides 120 g of protein and an average of 2328 kcals per day - with 21 g/L lipids on MWF)  Current Nutrition:  NPO TPN  Plan:  Begin TPN at 50 mL/hr. This will provide1056 kcal and 60g of protein Discuss with RD tomorrow whether to hold lipids for 7-10 days  Electrolytes in TPN similar to 2/12: Remove Ca. Increased Na 120 mEq/L. Continue 30 mEq/L of K, 3 mEq/L of Mg, 15 mmol/L of Phos. Cl:Ac 2:1.  Add standard MVI and trace elements to TPN Change to moderate q4h SSI and adjust as needed Monitor TPN labs on Mon/Thurs   Thank you for involving pharmacy in this patient's care.  Renold Genta, PharmD, BCPS Clinical Pharmacist Clinical phone for 12/01/2020 until 3p is [phone] 12/01/2020 9:01 AM  **Pharmacist phone directory can be found on [email or web address] listed under [place]**

## 2020-12-02 DIAGNOSIS — K631 Perforation of intestine (nontraumatic): Secondary | ICD-10-CM

## 2020-12-02 LAB — COMPREHENSIVE METABOLIC PANEL
ALT: 15 U/L (ref 0–44)
AST: 14 U/L — ABNORMAL LOW (ref 15–41)
Albumin: 2.3 g/dL — ABNORMAL LOW (ref 3.5–5.0)
Alkaline Phosphatase: 120 U/L (ref 38–126)
Anion gap: 11 (ref 5–15)
BUN: 24 mg/dL — ABNORMAL HIGH (ref 6–20)
CO2: 30 mmol/L (ref 22–32)
Calcium: 9.1 mg/dL (ref 8.9–10.3)
Chloride: 94 mmol/L — ABNORMAL LOW (ref 98–111)
Creatinine, Ser: 0.91 mg/dL (ref 0.61–1.24)
GFR, Estimated: >60 mL/min (ref >60)
Glucose, Bld: 133 mg/dL — ABNORMAL HIGH (ref 70–99)
Potassium: 3.5 mmol/L (ref 3.5–5.1)
Sodium: 135 mmol/L (ref 135–145)
Total Bilirubin: 1 mg/dL (ref 0.3–1.2)
Total Protein: 6.1 g/dL — ABNORMAL LOW (ref 6.5–8.1)

## 2020-12-02 LAB — DIFFERENTIAL
Abs Immature Granulocytes: 0 10*3/uL (ref 0.00–0.07)
Basophils Absolute: 0.4 10*3/uL — ABNORMAL HIGH (ref 0.0–0.1)
Basophils Relative: 4 %
Eosinophils Absolute: 0.6 10*3/uL — ABNORMAL HIGH (ref 0.0–0.5)
Eosinophils Relative: 7 %
Lymphocytes Relative: 17 %
Lymphs Abs: 1.5 10*3/uL (ref 0.7–4.0)
Monocytes Absolute: 0.5 10*3/uL (ref 0.1–1.0)
Monocytes Relative: 6 %
Neutro Abs: 5.8 10*3/uL (ref 1.7–7.7)
Neutrophils Relative %: 66 %
nRBC: 0 /100 WBC

## 2020-12-02 LAB — CBC
HCT: 28.8 % — ABNORMAL LOW (ref 39.0–52.0)
Hemoglobin: 8.6 g/dL — ABNORMAL LOW (ref 13.0–17.0)
MCH: 23.5 pg — ABNORMAL LOW (ref 26.0–34.0)
MCHC: 29.9 g/dL — ABNORMAL LOW (ref 30.0–36.0)
MCV: 78.7 fL — ABNORMAL LOW (ref 80.0–100.0)
Platelets: 792 10*3/uL — ABNORMAL HIGH (ref 150–400)
RBC: 3.66 MIL/uL — ABNORMAL LOW (ref 4.22–5.81)
WBC: 8.8 10*3/uL (ref 4.0–10.5)
nRBC: 0 % (ref 0.0–0.2)

## 2020-12-02 LAB — GLUCOSE, CAPILLARY
Glucose-Capillary: 139 mg/dL — ABNORMAL HIGH (ref 70–99)
Glucose-Capillary: 138 mg/dL — ABNORMAL HIGH (ref 70–99)
Glucose-Capillary: 110 mg/dL — ABNORMAL HIGH (ref 70–99)
Glucose-Capillary: 115 mg/dL — ABNORMAL HIGH (ref 70–99)
Glucose-Capillary: 165 mg/dL — ABNORMAL HIGH (ref 70–99)

## 2020-12-02 LAB — PHOSPHORUS: Phosphorus: 4.2 mg/dL (ref 2.5–4.6)

## 2020-12-02 LAB — MAGNESIUM: Magnesium: 2.2 mg/dL (ref 1.7–2.4)

## 2020-12-02 LAB — PREALBUMIN: Prealbumin: 21 mg/dL (ref 18–38)

## 2020-12-02 LAB — TRIGLYCERIDES: Triglycerides: 128 mg/dL (ref <150)

## 2020-12-02 MED ORDER — TRAVASOL 10 % IV SOLN
INTRAVENOUS | Status: AC
  Filled 2020-12-02: qty 1200

## 2020-12-02 MED ORDER — POTASSIUM CHLORIDE 10 MEQ/100ML IV SOLN
10.0000 meq | INTRAVENOUS | Status: AC
  Administered 2020-12-02 (×4): 10 meq via INTRAVENOUS
  Filled 2020-12-02 (×4): qty 100

## 2020-12-02 NOTE — Progress Notes (Signed)
Nutrition Follow-up  DOCUMENTATION CODES:   Non-severe (moderate) malnutrition in context of chronic illness  INTERVENTION:   -D/c Ensure Enlive po QID, each supplement provides 350 kcal and 20 grams of protein -TPN management per pharmacy  NUTRITION DIAGNOSIS:   Moderate Malnutrition related to  (illness) as evidenced by severe muscle depletion,moderate fat depletion,moderate muscle depletion.  Ongoing  GOAL:   Patient will meet greater than or equal to 90% of their needs  Progressing  MONITOR:   PO intake,Supplement acceptance,Skin,Weight trends,Labs,I & O's (TPN tolerance)  REASON FOR ASSESSMENT:   Malnutrition Screening Tool    ASSESSMENT:   50 year old male with history significant for tobacco abuse admitted with sepsis due to right retroperitoneal abscess and found to be positive for COVID-19. Pt who has not had any routine medical care for the last 10 years presents with 5 day onset of significant right groin pain that has been progressive and constant associated with chills and night sweats.  2/7 TPN initiated, s/p retroperitoneal abscess drain placement 2/9 Liver lesion biopsy,significant for colorectal adenocarcinoma 2/12- advanced to soft diet  2/13- TPN d/c, downgraded to full liquid diet 2/17- s/p Procedure(s) with comments: EXPLORATORY LAPAROTOMY (N/A) - DOW CASE TO START AT 730AM 4 HOUR CASE RIGHT COLECTOMY (Right) ILEOSTOMY CREATION (N/A) CYSTOSCOPY WITH STENT PLACEMENT (N/A) 2/20- NGT inserted, TPN initiated  Reviewed I/O's: -4 L x 24 hours and -10.6 L since 11/18/20  UOP: 500 ml x 24 hours  NGT output: 3.4 L x 24 hours  Drain output: 150 ml x 24 hours  Ileostomy output: 100 ml x 24 hours  Pt currently NPO.   Pt now on TPN. He is currently receiving TPN at 50 ml/hr, which provides 1056 kcals and 60 grams protein, which meets 48% of estimated kcals and 55% of estimated protein needs. Per notes, plan to increase TPN to goal rate of 100 ml/hr  today with lipids on Mondays, Wednesdays, and Fridays due to [employer]. Regimen to provides 2616 kcals and 126 grams protein.  Labs reviewed: CBGS: 110-139 (inpatient orders for glycemic control are 0-15 units insulin aspart every 4 hours).   Diet Order:   Diet Order            Diet NPO time specified Except for: Ice Chips  Diet effective now                 EDUCATION NEEDS:   Not appropriate for education at this time  Skin:  Skin Assessment: Skin Integrity Issues: Skin Integrity Issues:: Incisions Incisions: rt upper adomen, abdomen, and perineum  Last BM:  12/02/20 (100 ml via ileostomy)  Height:   Ht Readings from Last 1 Encounters:  11/15/20 6' (1.829 m)    Weight:   Wt Readings from Last 1 Encounters:  11/15/20 69.4 kg   BMI:  Body mass index is 20.75 kg/m.  Estimated Nutritional Needs:   Kcal:  2200-2430  Protein:  110-125 grams  Fluid:  >/= 2.2 L    Loistine Chance, RD, LDN, [place] Registered Dietitian II Certified Diabetes Care and Education Specialist Please refer to [facility] for RD and/or RD on-call/weekend/after hours pager

## 2020-12-02 NOTE — Progress Notes (Signed)
PHARMACY - TOTAL PARENTERAL NUTRITION CONSULT NOTE   Indication: Prolonged ileus  Patient Measurements: Height: 6' (182.9 cm) Weight: 69.4 kg (153 lb) IBW/kg (Calculated) : 77.6 TPN AdjBW (KG): 69.4 Body mass index is 20.75 kg/m. Usual Weight: 170 lbs  Assessment: Eric [name] is a 50 y.o. male no known medical history. Patient presented secondary to right groin pain and found to have a retroperitoneal abscess/bowel perforation/cecal mass concerning for neoplasm. Incidental finding of COVID-19 positive status. Surgery following, no surgical interventions at this time. Plans for short term management with drains and IV abx. Plan for surgical resection of R colon/retroperitoneal mass ideally in 6 weeks for more optimal surgical conditions.   Patient is s/p colon resection on 2/17. On 2/19 he vomited 500 ml green emesis and vomited again 2/20. Noted to have worsening ileus. NGT placed. Pharmacy consulted to resume TPN for prolonged ileus.   Glucose / Insulin: No history of diabetes. A1c 5.2. CBGs 120-140s. Utilized 10 units / 24 hrs s/p TPN start.  Electrolytes: K 3.5 (goal >=4), Mg 2.2, Phos 4.2. Corrected Ca 10.5, Cl 94. Other electrolytes wnl.  Renal: Scr 0.91 - stable. BUN 24.  LFTs / TGs: LFTs and Tbili WNL. TG 128.  Prealbumin / albumin: Prealbumin 31.5>21. Albumin 2.3.  Intake / Output; MIVF: UOP 0.5>0.3 ml/kg/hr. NGT output 1200>3400 ml. R JP drain 480>150 ml/24 hrs. Ileostomy output 100 ml.  GI Imaging:  2/4 CT abd: long segment masslike thickening on cecum concerning for neoplasm, R retroperitoneal abscess 2/9 MRI abd: lesion in peripheral R liver - possible intrahepatic abscess vs. Metastatic disease. Dec fluid in R iliopsoas.  Surgeries / Procedures:  2/7: R retroperitoneal drain placed, replaced in OR 2/17 217: ex-lap, right colectomy, end ileostomy, cystoscopy with stent placement  Central access: Triple lumen PICC 11/18/20 TPN start date: 11/18/20-11/24/20; resumed  2/20  Nutritional Goals (per RD recommendation on 2/16): Kcal:  2200-2430; Protein:  110-125; Fluid:  >/= 2.2 L Goal TPN rate is 100 mL/hr (provides 120 g of protein and an average of 2328 kcals per day - with 21 g/L lipids on MWF)  Current Nutrition:  NPO TPN  Plan:  Increase TPN to goal rate of 100 mL/hr Lipids MWF due to national shortage (previously held from 2/7 d/t shortage) Electrolytes in TPN: Cont no Ca from previous, inc K to 40 mEq/L, others with rate increase. Cl:Ac 2:1.  Give KCl IV 10 mEq x 4 Add standard MVI and trace elements to TPN Continue moderate q4h SSI and adjust as needed BMP, Mg, Phos in AM  Bertis Ruddy, PharmD Clinical Pharmacist Please check AMION for all [place] numbers 12/02/2020 7:28 AM

## 2020-12-02 NOTE — Progress Notes (Signed)
PROGRESS NOTE    Eric [name]  QQI:[id number] DOB: [birth year] DOA: 11/15/2020 PCP: [facility], [facility]   Chief Complaint  Patient presents with  . Groin Pain   Brief Narrative:50 year old male with no known PMH presented with right groin pain found to have retroperitoneal abscess with bowel perforation cecal mass,incidental Covid positive seen by surgery placed on TPN for severe protein calorie malnutrition and had IR drain the abscess.Patient underwent right hemicolectomy with ileostomy 2/17. Post-op course complicated by ileus, NGT was placed 2/20.  Patient is being managed with IV antibiotics leukocytosis much improved, remains on TPN with plan to wean off TPN as ileus improves.  Subjective:  Alert awake, pain stable, iloeostomy bag changed this am had small BM Old looking but not in distress NG output 3400 and JP drain output 150, small amount of stool in the colostomy bag this morning.  Assessment & Plan:  Perforated colon cancer with RTP abscess s/p right colectomy/ileostomy 11/28/2020, s/p ureteral stent placement. Stage IV adenocarcinoma / liver mets Postop ileus POD #5.  Local site clean no evidence of infection no leukocytosis no fever.  JP drain in place output 150 mL, NG tube with 3400 mL, continue NG tube decompression for postop ileus.  Continue drain.  Discussed with surgery and appreciate input increase activity incentive spirometry, continue with pain management antiemetics and IV Zosyn for antibiotics. Being followed by oncology-we will need port placement prior to discharge and outpatient oncology follow-up  Sepsis POA 2/2 perforated colon mass.  Sepsis physiology resolved.  On Zosyn since 11/15/20. Recent Labs  Lab 11/28/20 0338 11/29/20 0352 11/30/20 0315 12/01/20 0321 12/01/20 0345 12/02/20 0343  WBC 8.2 13.6* 17.5*  --  15.2* 8.8  LATICACIDVEN  --   --   --  1.1  --   --    Blood loss anemia with hemoglobin 5.4 g on presentation in the setting of  anemia with chronic GI bleed from colon cancer.  Status post PRBC x4 units.  Monitor H&H. Recent Labs  Lab 11/28/20 0338 11/29/20 0352 11/30/20 0315 12/01/20 0345 12/02/20 0343  HGB 9.2* 9.6* 9.9* 9.4* 8.6*  HCT 29.5* 31.1* 33.2* 31.2* 28.8*   Deconditioning/debility/fatigue: Due to complex comorbidities as above.  PT OT as tolerated.  Moderate protein calorie malnutrition wean off TPN slowly as able to tolerate p.o.  Thrombocytosis likely reactive.  Monitor Hyponatremia 120S on admission in the setting of #1.  Status post salt tablet.  Resolved. Hyperglycemic-likely reactive A1c was normal at five-point COVID-19 virus infection positive incidental/asymptomatic status post 3 days of remdesivir off isolation 2/15. Tobacco abuse: He has been counseled  Nutrition: Diet Order            Diet NPO time specified Except for: Ice Chips  Diet effective now                 Nutrition Problem: Moderate Malnutrition Etiology:  (illness) Signs/Symptoms: severe muscle depletion,moderate fat depletion,moderate muscle depletion Interventions: Ensure Enlive (each supplement provides 350kcal and 20 grams of protein),TPN Pt's Body mass index is 20.75 kg/m.  DVT prophylaxis: heparin injection 5,000 Units Start: 11/29/20 0800 SCDs Start: 11/15/20 2135 Code Status:   Code Status: Full Code  Family Communication: plan of care discussed with patient at bedside. Self updating his sister and friend  Status is: Inpatient Remains inpatient appropriate because:Inpatient level of care appropriate due to severity of illness and ongoing post op care- ngt  drain and TPN  Dispo: The patient is from: Home  Anticipated d/c is to: Home.  Reports he will be living with his sister while he is getting chemotherapy              Anticipated d/c date is: 3 days              Patient currently is not medically stable to d/c.   Difficult to place patient No   Consultants:see note  Procedures:see  note  Unresulted Labs (From admission, onward)          Start     Ordered   12/03/20 0500  Magnesium  Tomorrow morning,   R       Question:  Specimen collection method  Answer:  IV Team=IV Team collect   12/02/20 0745   12/03/20 0500  Phosphorus  Tomorrow morning,   R       Question:  Specimen collection method  Answer:  IV Team=IV Team collect   12/02/20 0745   12/02/20 0500  Comprehensive metabolic panel  (TPN Lab Panel)  Every Mon,Thu (0500),   R     Question:  Specimen collection method  Answer:  IV Team=IV Team collect   12/01/20 1056   12/02/20 0500  Magnesium  (TPN Lab Panel)  Every Mon,Thu (0500),   R     Question:  Specimen collection method  Answer:  IV Team=IV Team collect   12/01/20 1056   12/02/20 0500  Phosphorus  (TPN Lab Panel)  Every Mon,Thu (0500),   R     Question:  Specimen collection method  Answer:  IV Team=IV Team collect   12/01/20 1056   12/02/20 0500  CBC  (TPN Lab Panel)  Every Monday (0500),   R     Question:  Specimen collection method  Answer:  IV Team=IV Team collect   12/01/20 1056   12/02/20 0500  Differential  (TPN Lab Panel)  Every Monday (0500),   R     Question:  Specimen collection method  Answer:  IV Team=IV Team collect   12/01/20 1056   12/02/20 0500  Triglycerides  (TPN Lab Panel)  Every Monday (0500),   R     Question:  Specimen collection method  Answer:  IV Team=IV Team collect   12/01/20 1056   12/02/20 0500  Prealbumin  (TPN Lab Panel)  Every Monday (0500),   R     Question:  Specimen collection method  Answer:  IV Team=IV Team collect   12/01/20 1056   12/01/20 0500  CBC  Daily,   R     Question:  Specimen collection method  Answer:  IV Team=IV Team collect   11/30/20 0916   12/01/20 6644  Basic metabolic panel  Daily,   R     Question:  Specimen collection method  Answer:  IV Team=IV Team collect   11/30/20 0916          Culture/Microbiology    Component Value Date/Time   SDES BIOPSY LIVER 11/20/2020 1622   SPECREQUEST  Normal 11/20/2020 1622   CULT  11/20/2020 1622    No growth aerobically or anaerobically. Performed at [facility], [place] [address] [place], [place] [address]    REPTSTATUS 11/25/2020 FINAL 11/20/2020 1622    Other culture-see note  Medications: Scheduled Meds: . acetaminophen  1,000 mg Oral Q6H  . Chlorhexidine Gluconate Cloth  6 each Topical Daily  . feeding supplement  237 mL Oral QID  . heparin injection (subcutaneous)  5,000 Units Subcutaneous Q8H  .  influenza vac split quadrivalent PF  0.5 mL Intramuscular Tomorrow-1000  . insulin aspart  0-15 Units Subcutaneous Q4H  . lidocaine  1 patch Transdermal Daily  . lip balm  1 application Topical BID  . nicotine  21 mg Transdermal Daily  . ondansetron (ZOFRAN) IV  4 mg Intravenous Once  . pantoprazole (PROTONIX) IV  40 mg Intravenous Q12H  . sodium chloride flush  5 mL Intracatheter Q8H   Continuous Infusions: . lactated ringers    . methocarbamol (ROBAXIN) IV 1,000 mg (12/02/20 0550)  . ondansetron [facility]) IV 8 mg (12/01/20 0152)  . piperacillin-tazobactam (ZOSYN)  IV 3.375 g (12/02/20 0549)  . potassium chloride 10 mEq (12/02/20 0930)  . TPN ADULT (ION) 50 mL/hr at 12/01/20 1752    Antimicrobials: Anti-infectives (From admission, onward)   Start     Dose/Rate Route Frequency Ordered Stop   11/30/20 1045  ceFAZolin (ANCEF) IVPB 2g/100 mL premix  Status:  Discontinued        2 g 200 mL/hr over 30 Minutes Intravenous On call to O.R. 11/30/20 0957 11/30/20 1043   11/16/20 1000  remdesivir 100 mg in sodium chloride 0.9 % 100 mL IVPB       "Followed by" Linked Group Details   100 mg 200 mL/hr over 30 Minutes Intravenous Daily 11/15/20 2135 11/17/20 1002   11/15/20 2230  piperacillin-tazobactam (ZOSYN) IVPB 3.375 g        3.375 g 12.5 mL/hr over 240 Minutes Intravenous Every 8 hours 11/15/20 2144 12/03/20 2359   11/15/20 2134  remdesivir 200 mg in sodium chloride 0.9% 250 mL IVPB       "Followed by" Linked Group  Details   200 mg 580 mL/hr over 30 Minutes Intravenous Once 11/15/20 2135 11/16/20 0029   11/15/20 1700  piperacillin-tazobactam (ZOSYN) IVPB 3.375 g        3.375 g 100 mL/hr over 30 Minutes Intravenous  Once 11/15/20 1657 11/15/20 1826     Objective: Vitals: Today's Vitals   12/02/20 0341 12/02/20 0411 12/02/20 0556 12/02/20 0626  BP:      Pulse:      Resp:      Temp:      TempSrc:      SpO2:      Weight:      Height:      PainSc: 9  Asleep 8  Asleep    Intake/Output Summary (Last 24 hours) at 12/02/2020 0948 Last data filed at 12/02/2020 0629 Gross per 24 hour  Intake 180 ml  Output 4150 ml  Net -3970 ml   Filed Weights   11/15/20 2000  Weight: 69.4 kg   Weight change:   Intake/Output from previous day: 02/20 0701 - 02/21 0700 In: 180 [P.O.:180] Out: 4150 [Urine:500; Emesis/NG output:3400; Drains:150; Stool:100] Intake/Output this shift: No intake/output data recorded. Filed Weights   11/15/20 2000  Weight: 69.4 kg    Examination: General exam: AAOx3, older for his age,NAD, weak appearing. HEENT:Oral mucosa moist, Ear/Nose WNL grossly,dentition normal. Respiratory system: bilaterally diminished,no wheezing or crackles,no use of accessory muscle, non tender. Cardiovascular system: S1 & S2 +, regular, No JVD. Gastrointestinal system: Abdomen soft, midline wound with dressing in place changed this morning, staples present dry,ND, BS+. Nervous System:Alert, awake, moving extremities and grossly nonfocal Extremities: No edema, distal peripheral pulses palpable.  Skin: No rashes,no icterus. MSK: Normal muscle bulk,tone, power   Data Reviewed: I have personally reviewed following labs and imaging studies CBC: Recent Labs  Lab 11/28/20 0338 11/29/20  3716 11/30/20 0315 12/01/20 0345 12/02/20 0343  WBC 8.2 13.6* 17.5* 15.2* 8.8  NEUTROABS  --   --  14.5* 12.4* 5.8  HGB 9.2* 9.6* 9.9* 9.4* 8.6*  HCT 29.5* 31.1* 33.2* 31.2* 28.8*  MCV 76.6* 76.0* 77.0* 77.8*  78.7*  PLT 773* 840* 860* 896* 967*   Basic Metabolic Panel: Recent Labs  Lab 11/28/20 0338 11/29/20 0352 11/30/20 0315 12/01/20 0345 12/01/20 0939 12/02/20 0343  NA 131* 132* 132* 132*  --  135  K 4.6 4.6 4.2 3.6  --  3.5  CL 97* 97* 95* 91*  --  94*  CO2 25 25 28 30   --  30  GLUCOSE 109* 126* 156* 144*  --  133*  BUN 15 16 18 20   --  24*  CREATININE 0.81 0.83 0.76 0.90  --  0.91  CALCIUM 8.9 9.2 8.9 9.1  --  9.1  MG 2.3  --   --   --  2.2 2.2  PHOS  --   --   --   --  4.5 4.2   GFR: Estimated Creatinine Clearance: 94.3 mL/min (by C-G formula based on SCr of 0.91 mg/dL). Liver Function Tests: Recent Labs  Lab 11/26/20 0311 11/27/20 0341 12/02/20 0343  AST 25 20 14*  ALT 38 29 15  ALKPHOS 126 103 120  BILITOT 0.4 0.7 1.0  PROT 6.4* 6.2* 6.1*  ALBUMIN 2.2* 2.2* 2.3*   No results for input(s): LIPASE, AMYLASE in the last 168 hours. No results for input(s): AMMONIA in the last 168 hours. Coagulation Profile: No results for input(s): INR, PROTIME in the last 168 hours. Cardiac Enzymes: No results for input(s): CKTOTAL, CKMB, CKMBINDEX, TROPONINI in the last 168 hours. BNP (last 3 results) No results for input(s): PROBNP in the last 8760 hours. HbA1C: Recent Labs    12/01/20 0345  HGBA1C 5.2   CBG: Recent Labs  Lab 12/01/20 1708 12/01/20 2008 12/01/20 2330 12/02/20 0335 12/02/20 0810  GLUCAP 128* 139* 136* 139* 138*   Lipid Profile: Recent Labs    12/02/20 0343  TRIG 128   Thyroid Function Tests: No results for input(s): TSH, T4TOTAL, FREET4, T3FREE, THYROIDAB in the last 72 hours. Anemia Panel: No results for input(s): VITAMINB12, FOLATE, FERRITIN, TIBC, IRON, RETICCTPCT in the last 72 hours. Sepsis Labs: Recent Labs  Lab 12/01/20 0321  LATICACIDVEN 1.1    Recent Results (from the past 240 hour(s))  Surgical pcr screen     Status: None   Collection Time: 11/27/20 12:36 AM   Specimen: Nasal Mucosa; Nasal Swab  Result Value Ref Range Status    MRSA, PCR NEGATIVE NEGATIVE Final   Staphylococcus aureus NEGATIVE NEGATIVE Final    Comment: (NOTE) The Xpert SA Assay (FDA approved for NASAL specimens in patients 51 years of age and older), is one component of a comprehensive surveillance program. It is not intended to diagnose infection nor to guide or monitor treatment. Performed at [facility], [place] [address], [place], [place] [address]      Radiology Studies: DG Abd 1 View  Result Date: 12/01/2020 CLINICAL DATA:  Evaluate NG tube EXAM: ABDOMEN - 1 VIEW COMPARISON:  December 01, 2020 FINDINGS: The NG tube is been reposition in the interval now terminates in the stomach. IMPRESSION: The NG tube has been reposition in the interval and now terminates in the stomach. Electronically Signed   By: Dorise Bullion III M.D   On: 12/01/2020 12:43   DG Abd 1 View  Result Date: 12/01/2020 CLINICAL DATA:  Evaluate NG tube placement EXAM: ABDOMEN - 1 VIEW COMPARISON:  12/01/2020 FINDINGS: The air is a right arm PICC line with tip noted in the projection of the cavoatrial junction. The nasogastric tube is looped within the distal half of the esophagus with tip and side port above the GE junction and directed cranially. Dilated loop of bowel is noted in the left abdomen. There is a large stool burden overlying the right lower quadrant of the abdomen. IMPRESSION: 1. The nasogastric tube is looped within the distal half of the esophagus with tip and side port above the GE junction. 2. Dilated loop of bowel in the left abdomen. 3. Large stool burden overlying the right lower quadrant of the abdomen. Electronically Signed   By: Kerby Moors M.D.   On: 12/01/2020 12:37   DG Abd 1 View  Result Date: 12/01/2020 CLINICAL DATA:  NG tube placement. EXAM: ABDOMEN - 1 VIEW COMPARISON:  CT AP 11/25/2020 FINDINGS: Right arm PICC line tip is at the cavoatrial junction. The nasogastric tube is looped within the distal half of the esophagus. The distal end  of the NG tube is oriented cranially with tip at the level of the carina. IMPRESSION: The nasogastric tube is looped within the distal half of the esophagus. The distal end of the NG tube is oriented cranially with tip at the level of the carina. Advise repositioning. Electronically Signed   By: Kerby Moors M.D.   On: 12/01/2020 12:29     LOS: 17 days   Antonieta Pert, MD [facility]  12/02/2020, 9:48 AM

## 2020-12-02 NOTE — Progress Notes (Signed)
4 Days Post-Op  Subjective: Nausea over weekend, NG replaced yesterday. Pain overall controlled. Pt has been walking in the halls - last walked Saturday.   ROS: See above, otherwise other systems negative  Objective: Vital signs in last 24 hours: Temp:  [98 F (36.7 C)-98.7 F (37.1 C)] 98 F (36.7 C) (02/21 0338) Pulse Rate:  [87-104] 87 (02/21 0338) Resp:  [16-18] 16 (02/21 0338) BP: (122-136)/(90-94) 129/94 (02/21 0338) SpO2:  [94 %-97 %] 96 % (02/21 0338) Last BM Date: 11/29/20  Intake/Output from previous day: 02/20 0701 - 02/21 0700 In: 180 [P.O.:180] Out: 4150 [Urine:500; Emesis/NG output:3400; Drains:150; Stool:100] Intake/Output this shift: No intake/output data recorded.   PE: Gen: alert, cooperative NAD Abd: soft, appropriately tender, +BS, minimal distention, ileostomy in R hemiabdomen - edematous and viable, small amt dark stool in ostomy pouch. Midline wound with widely spaced staples in place - no cellulitis, some SS drainage.   NG - 3,400 cc/24h  JP - 150 cc/24h SS   Lab Results:  Recent Labs    12/01/20 0345 12/02/20 0343  WBC 15.2* 8.8  HGB 9.4* 8.6*  HCT 31.2* 28.8*  PLT 896* 792*   BMET Recent Labs    12/01/20 0345 12/02/20 0343  NA 132* 135  K 3.6 3.5  CL 91* 94*  CO2 30 30  GLUCOSE 144* 133*  BUN 20 24*  CREATININE 0.90 0.91  CALCIUM 9.1 9.1   PT/INR No results for input(s): LABPROT, INR in the last 72 hours. CMP     Component Value Date/Time   NA 135 12/02/2020 0343   K 3.5 12/02/2020 0343   CL 94 (L) 12/02/2020 0343   CO2 30 12/02/2020 0343   GLUCOSE 133 (H) 12/02/2020 0343   BUN 24 (H) 12/02/2020 0343   CREATININE 0.91 12/02/2020 0343   CALCIUM 9.1 12/02/2020 0343   PROT 6.1 (L) 12/02/2020 0343   ALBUMIN 2.3 (L) 12/02/2020 0343   AST 14 (L) 12/02/2020 0343   ALT 15 12/02/2020 0343   ALKPHOS 120 12/02/2020 0343   BILITOT 1.0 12/02/2020 0343   GFRNONAA >60 12/02/2020 0343   Lipase  No results found for:  LIPASE     Studies/Results: DG Abd 1 View  Result Date: 12/01/2020 CLINICAL DATA:  Evaluate NG tube EXAM: ABDOMEN - 1 VIEW COMPARISON:  December 01, 2020 FINDINGS: The NG tube is been reposition in the interval now terminates in the stomach. IMPRESSION: The NG tube has been reposition in the interval and now terminates in the stomach. Electronically Signed   By: Dorise Bullion III M.D   On: 12/01/2020 12:43   DG Abd 1 View  Result Date: 12/01/2020 CLINICAL DATA:  Evaluate NG tube placement EXAM: ABDOMEN - 1 VIEW COMPARISON:  12/01/2020 FINDINGS: The air is a right arm PICC line with tip noted in the projection of the cavoatrial junction. The nasogastric tube is looped within the distal half of the esophagus with tip and side port above the GE junction and directed cranially. Dilated loop of bowel is noted in the left abdomen. There is a large stool burden overlying the right lower quadrant of the abdomen. IMPRESSION: 1. The nasogastric tube is looped within the distal half of the esophagus with tip and side port above the GE junction. 2. Dilated loop of bowel in the left abdomen. 3. Large stool burden overlying the right lower quadrant of the abdomen. Electronically Signed   By: Kerby Moors M.D.   On: 12/01/2020 12:37  DG Abd 1 View  Result Date: 12/01/2020 CLINICAL DATA:  NG tube placement. EXAM: ABDOMEN - 1 VIEW COMPARISON:  CT AP 11/25/2020 FINDINGS: Right arm PICC line tip is at the cavoatrial junction. The nasogastric tube is looped within the distal half of the esophagus. The distal end of the NG tube is oriented cranially with tip at the level of the carina. IMPRESSION: The nasogastric tube is looped within the distal half of the esophagus. The distal end of the NG tube is oriented cranially with tip at the level of the carina. Advise repositioning. Electronically Signed   By: Kerby Moors M.D.   On: 12/01/2020 12:29    Anti-infectives: Anti-infectives (From admission, onward)    Start     Dose/Rate Route Frequency Ordered Stop   11/30/20 1045  ceFAZolin (ANCEF) IVPB 2g/100 mL premix  Status:  Discontinued        2 g 200 mL/hr over 30 Minutes Intravenous On call to O.R. 11/30/20 0957 11/30/20 1043   11/16/20 1000  remdesivir 100 mg in sodium chloride 0.9 % 100 mL IVPB       "Followed by" Linked Group Details   100 mg 200 mL/hr over 30 Minutes Intravenous Daily 11/15/20 2135 11/17/20 1002   11/15/20 2230  piperacillin-tazobactam (ZOSYN) IVPB 3.375 g        3.375 g 12.5 mL/hr over 240 Minutes Intravenous Every 8 hours 11/15/20 2144 12/03/20 2359   11/15/20 2134  remdesivir 200 mg in sodium chloride 0.9% 250 mL IVPB       "Followed by" Linked Group Details   200 mg 580 mL/hr over 30 Minutes Intravenous Once 11/15/20 2135 11/16/20 0029   11/15/20 1700  piperacillin-tazobactam (ZOSYN) IVPB 3.375 g        3.375 g 100 mL/hr over 30 Minutes Intravenous  Once 11/15/20 1657 11/15/20 1826       Assessment/Plan Anemia Hyponatremia Tobacco abuse COVID-19positive  Severe malnutrition - prealbumin 5.8 (2/8), continue TPN  Colonmass with perforationand RTP abscess Stage IV adenocarcinomacolon  S/P EXPLORATORY LAPAROTOMY, RIGHT COLECTOMY, END ILEOSTOMY CREATION, CYSTOSCOPY W/ STENT PLACEMENT 11/28/20 (Dr. Rosendo Gros; Dr. Abner Greenspan) - POD#4 - AFVSS, WBC 8.8  - s/p IR drain placement 2/7 - this drain was removed intra-operatively and new blake drain placed; IR cx Enterobacter aerogenes - post-op ileus >> NG replaced 2/20, > 3L/24h. Continue NG to LIWS and await further bowel function - continue drain and IV zosyn  - PRN analgesics and antiemetics  - OOB/IS  - will likely need port-a-cath prior to discharge, will discuss timing with MD  Liver metastasis  - x3 noted on MRI, unsure if abscess vs metastatic disease.IR liver biopsy 11/20/2020; -A. LIVER, RIGHT MASS, NEEDLE CORE BIOPSY:  - Adenocarcinoma.  COMMENT:  The morphology suggests metastatic colorectal  adenocarcinoma.  Immunohistochemistry will be performed and reported as an addendum.  There is sufficient tissue for additional testing.   ID -zosyn 2/4>> FEN - full liquids VTE:  SQ heparin Foley -D/C-ed 2/18 Dispo - post-op ileus, TPN, await bowel function    LOS: 17 days    Jill Alexanders , [facility] 12/02/2020, 9:34 AM Please see Amion for pager number during day hours 7:00am-4:30pm or 7:00am -11:30am on weekends

## 2020-12-02 NOTE — Consult Note (Signed)
[place] Nurse ostomy follow up Patient receiving care in [facility] [id number] Stoma type/location: RLQ Ileostomy  Stomal assessment/size: 1 3/4" red moist budded. Swelling of the stoma has decreased.  Peristomal assessment: Skin around stoma is intact with minor bleeding around the stoma edges.  Output: Black green mushy 120mls Ostomy pouching: 2pc.  Education provided: Patient was able to participate in some education. He observed the pouch removal. Stressed cleaning with just water. Explained barrier ring. Patient was able to open and close new pouch. States that his sister will be helping him at home. I will try to reach her by phone to set up a time for teaching.  Enrolled patient in [place] Start Discharge program: No Take SS form back for next teaching session. 2 piece pouching system   Fecal pouches, [place] #[phone]              Skin Nils Pyle # 2      Kizzie Bane, [employer] # [place] Tamala Julian, MSN, RN, [place], Lysle Pearl, [facility] Wound Treatment Associate Pager [phone]

## 2020-12-03 LAB — BASIC METABOLIC PANEL
Anion gap: 13 (ref 5–15)
BUN: 23 mg/dL — ABNORMAL HIGH (ref 6–20)
CO2: 25 mmol/L (ref 22–32)
Calcium: 8.9 mg/dL (ref 8.9–10.3)
Chloride: 97 mmol/L — ABNORMAL LOW (ref 98–111)
Creatinine, Ser: 0.67 mg/dL (ref 0.61–1.24)
GFR, Estimated: >60 mL/min (ref >60)
Glucose, Bld: 153 mg/dL — ABNORMAL HIGH (ref 70–99)
Potassium: 3.7 mmol/L (ref 3.5–5.1)
Sodium: 135 mmol/L (ref 135–145)

## 2020-12-03 LAB — CBC
HCT: 29.7 % — ABNORMAL LOW (ref 39.0–52.0)
Hemoglobin: 8.9 g/dL — ABNORMAL LOW (ref 13.0–17.0)
MCH: 23.9 pg — ABNORMAL LOW (ref 26.0–34.0)
MCHC: 30 g/dL (ref 30.0–36.0)
MCV: 79.6 fL — ABNORMAL LOW (ref 80.0–100.0)
Platelets: 835 10*3/uL — ABNORMAL HIGH (ref 150–400)
RBC: 3.73 MIL/uL — ABNORMAL LOW (ref 4.22–5.81)
WBC: 10.8 10*3/uL — ABNORMAL HIGH (ref 4.0–10.5)
nRBC: 0 % (ref 0.0–0.2)

## 2020-12-03 LAB — PHOSPHORUS: Phosphorus: 3.4 mg/dL (ref 2.5–4.6)

## 2020-12-03 LAB — MAGNESIUM: Magnesium: 1.8 mg/dL (ref 1.7–2.4)

## 2020-12-03 LAB — GLUCOSE, CAPILLARY
Glucose-Capillary: 131 mg/dL — ABNORMAL HIGH (ref 70–99)
Glucose-Capillary: 134 mg/dL — ABNORMAL HIGH (ref 70–99)
Glucose-Capillary: 150 mg/dL — ABNORMAL HIGH (ref 70–99)
Glucose-Capillary: 152 mg/dL — ABNORMAL HIGH (ref 70–99)
Glucose-Capillary: 157 mg/dL — ABNORMAL HIGH (ref 70–99)
Glucose-Capillary: 162 mg/dL — ABNORMAL HIGH (ref 70–99)

## 2020-12-03 MED ORDER — POTASSIUM CHLORIDE 10 MEQ/100ML IV SOLN
10.0000 meq | INTRAVENOUS | Status: AC
  Administered 2020-12-03 (×2): 10 meq via INTRAVENOUS
  Filled 2020-12-03 (×2): qty 100

## 2020-12-03 MED ORDER — METHOCARBAMOL 1000 MG/10ML IJ SOLN
1000.0000 mg | Freq: Four times a day (QID) | INTRAVENOUS | Status: DC
  Administered 2020-12-03 – 2020-12-12 (×33): 1000 mg via INTRAVENOUS
  Filled 2020-12-03 (×11): qty 10
  Filled 2020-12-03: qty 1000
  Filled 2020-12-03 (×26): qty 10

## 2020-12-03 MED ORDER — TRAVASOL 10 % IV SOLN
INTRAVENOUS | Status: AC
  Filled 2020-12-03: qty 1200

## 2020-12-03 MED ORDER — MAGNESIUM SULFATE IN D5W 1-5 GM/100ML-% IV SOLN
1.0000 g | Freq: Once | INTRAVENOUS | Status: AC
  Administered 2020-12-03: 1 g via INTRAVENOUS
  Filled 2020-12-03: qty 100

## 2020-12-03 MED ORDER — ACETAMINOPHEN 10 MG/ML IV SOLN
1000.0000 mg | Freq: Four times a day (QID) | INTRAVENOUS | Status: AC
  Administered 2020-12-03 (×3): 1000 mg via INTRAVENOUS
  Filled 2020-12-03 (×3): qty 100

## 2020-12-03 MED ORDER — HYDROMORPHONE HCL 1 MG/ML IJ SOLN
0.5000 mg | INTRAMUSCULAR | Status: DC | PRN
  Administered 2020-12-03 (×5): 1 mg via INTRAVENOUS
  Administered 2020-12-04 (×2): 0.5 mg via INTRAVENOUS
  Administered 2020-12-04: 1 mg via INTRAVENOUS
  Administered 2020-12-04: 0.5 mg via INTRAVENOUS
  Administered 2020-12-04 – 2020-12-05 (×3): 1 mg via INTRAVENOUS
  Filled 2020-12-03 (×12): qty 1

## 2020-12-03 NOTE — Plan of Care (Signed)

## 2020-12-03 NOTE — Progress Notes (Signed)
5 Days Post-Op  Subjective: NAEO. Reports minimal NG output last 24 hours (350 cc documented). Reports his bad was emptied yesterday and had a lot of dark stool in it -- only 100 cc documented in epic.   ROS: See above, otherwise other systems negative  Objective: Vital signs in last 24 hours: Temp:  [97.5 F (36.4 C)-98.4 F (36.9 C)] 97.5 F (36.4 C) (02/22 0401) Pulse Rate:  [90-94] 90 (02/22 0401) Resp:  [17-18] 17 (02/22 0401) BP: (134-135)/(92-101) 134/101 (02/22 0401) SpO2:  [96 %-97 %] 97 % (02/22 0401) Last BM Date: 12/02/20  Intake/Output from previous day: 02/21 0701 - 02/22 0700 In: 400 [P.O.:90; I.V.:10; IV Piggyback:300] Out: 1865 [Urine:1250; Emesis/NG output:350; Drains:165; Stool:100] Intake/Output this shift: No intake/output data recorded.   PE: Gen: alert, cooperative NAD Abd: soft, appropriately tender, +BS, mild distention, ileostomy in R hemiabdomen - edematous and viable, small amt dark stool in ostomy pouch. Midline wound with widely spaced staples in place - no cellulitis, some SS drainage.   NG - 350 cc/24h; suction cannister was slightly open so it wasn not suctioning properly this AM. NG clamped by me. Will perform clamp trial.   JP - 165 cc/24h SS   Lab Results:  Recent Labs    12/02/20 0343 12/03/20 0445  WBC 8.8 10.8*  HGB 8.6* 8.9*  HCT 28.8* 29.7*  PLT 792* 835*   BMET Recent Labs    12/02/20 0343 12/03/20 0445  NA 135 135  K 3.5 3.7  CL 94* 97*  CO2 30 25  GLUCOSE 133* 153*  BUN 24* 23*  CREATININE 0.91 0.67  CALCIUM 9.1 8.9   PT/INR No results for input(s): LABPROT, INR in the last 72 hours. CMP     Component Value Date/Time   NA 135 12/03/2020 0445   K 3.7 12/03/2020 0445   CL 97 (L) 12/03/2020 0445   CO2 25 12/03/2020 0445   GLUCOSE 153 (H) 12/03/2020 0445   BUN 23 (H) 12/03/2020 0445   CREATININE 0.67 12/03/2020 0445   CALCIUM 8.9 12/03/2020 0445   PROT 6.1 (L) 12/02/2020 0343   ALBUMIN 2.3 (L)  12/02/2020 0343   AST 14 (L) 12/02/2020 0343   ALT 15 12/02/2020 0343   ALKPHOS 120 12/02/2020 0343   BILITOT 1.0 12/02/2020 0343   GFRNONAA >60 12/03/2020 0445   Lipase  No results found for: LIPASE     Studies/Results: DG Abd 1 View  Result Date: 12/01/2020 CLINICAL DATA:  Evaluate NG tube EXAM: ABDOMEN - 1 VIEW COMPARISON:  December 01, 2020 FINDINGS: The NG tube is been reposition in the interval now terminates in the stomach. IMPRESSION: The NG tube has been reposition in the interval and now terminates in the stomach. Electronically Signed   By: Dorise Bullion III M.D   On: 12/01/2020 12:43   DG Abd 1 View  Result Date: 12/01/2020 CLINICAL DATA:  Evaluate NG tube placement EXAM: ABDOMEN - 1 VIEW COMPARISON:  12/01/2020 FINDINGS: The air is a right arm PICC line with tip noted in the projection of the cavoatrial junction. The nasogastric tube is looped within the distal half of the esophagus with tip and side port above the GE junction and directed cranially. Dilated loop of bowel is noted in the left abdomen. There is a large stool burden overlying the right lower quadrant of the abdomen. IMPRESSION: 1. The nasogastric tube is looped within the distal half of the esophagus with tip and side port above the  GE junction. 2. Dilated loop of bowel in the left abdomen. 3. Large stool burden overlying the right lower quadrant of the abdomen. Electronically Signed   By: Kerby Moors M.D.   On: 12/01/2020 12:37   DG Abd 1 View  Result Date: 12/01/2020 CLINICAL DATA:  NG tube placement. EXAM: ABDOMEN - 1 VIEW COMPARISON:  CT AP 11/25/2020 FINDINGS: Right arm PICC line tip is at the cavoatrial junction. The nasogastric tube is looped within the distal half of the esophagus. The distal end of the NG tube is oriented cranially with tip at the level of the carina. IMPRESSION: The nasogastric tube is looped within the distal half of the esophagus. The distal end of the NG tube is oriented cranially  with tip at the level of the carina. Advise repositioning. Electronically Signed   By: Kerby Moors M.D.   On: 12/01/2020 12:29    Anti-infectives: Anti-infectives (From admission, onward)   Start     Dose/Rate Route Frequency Ordered Stop   11/30/20 1045  ceFAZolin (ANCEF) IVPB 2g/100 mL premix  Status:  Discontinued        2 g 200 mL/hr over 30 Minutes Intravenous On call to O.R. 11/30/20 0957 11/30/20 1043   11/16/20 1000  remdesivir 100 mg in sodium chloride 0.9 % 100 mL IVPB       "Followed by" Linked Group Details   100 mg 200 mL/hr over 30 Minutes Intravenous Daily 11/15/20 2135 11/17/20 1002   11/15/20 2230  piperacillin-tazobactam (ZOSYN) IVPB 3.375 g        3.375 g 12.5 mL/hr over 240 Minutes Intravenous Every 8 hours 11/15/20 2144 12/03/20 2359   11/15/20 2134  remdesivir 200 mg in sodium chloride 0.9% 250 mL IVPB       "Followed by" Linked Group Details   200 mg 580 mL/hr over 30 Minutes Intravenous Once 11/15/20 2135 11/16/20 0029   11/15/20 1700  piperacillin-tazobactam (ZOSYN) IVPB 3.375 g        3.375 g 100 mL/hr over 30 Minutes Intravenous  Once 11/15/20 1657 11/15/20 1826       Assessment/Plan Anemia Hyponatremia Tobacco abuse COVID-19positive  Severe malnutrition - prealbumin 5.8 (2/8), continue TPN  Colonmass with perforationand RTP abscess Stage IV adenocarcinomacolon  S/P EXPLORATORY LAPAROTOMY, RIGHT COLECTOMY, END ILEOSTOMY CREATION, CYSTOSCOPY W/ STENT PLACEMENT 11/28/20 (Dr. Rosendo Gros; Dr. Abner Greenspan) - POD#5 - AFVSS, WBC 10.8 from 8.8  - s/p IR drain placement 2/7 - this drain was removed intra-operatively and new blake drain placed; IR cx Enterobacter aerogenes - post-op ileus >> NG replaced 2/20, now with minimal output and small amt stool. Question of if NG was hooked up incorrectly all night. Will perform NG clamp trial and check residuals at 1300.  - continue drain and IV zosyn  - PRN analgesics and antiemetics  - OOB/IS  - will likely need  port-a-cath prior to discharge, will discuss timing with MD  Liver metastasis  - x3 noted on MRI, unsure if abscess vs metastatic disease.IR liver biopsy 11/20/2020; -A. LIVER, RIGHT MASS, NEEDLE CORE BIOPSY:  - Adenocarcinoma.  COMMENT:  The morphology suggests metastatic colorectal adenocarcinoma.  Immunohistochemistry will be performed and reported as an addendum.  There is sufficient tissue for additional testing.   ID -zosyn 2/4>> FEN - NPO, TPN, Ng clamp trial  VTE:  SQ heparin Foley -D/C-ed 2/18 Dispo - post-op ileus, TPN, await bowel function    LOS: 18 days    Jill Alexanders , [facility]  12/03/2020, 7:47 AM Please see Amion for pager number during day hours 7:00am-4:30pm or 7:00am -11:30am on weekends

## 2020-12-03 NOTE — Progress Notes (Signed)
This chaplain is present for F/U spiritual care.  The Pt. is awake and enjoying a strawberry italian ice.  The Pt. acknowledges pain medicine is less frequent now and he is looking forward to the removal of the NG tube today.  The chaplain understands one of the Pt. goals are to enjoy his favorite cereal, Frosted Flakes, at home and be reunited with his cat Mittens. Home is a shared experience with his best friend and sister.   The chaplain is appreciative for the RN-Paulette's care. The Pt. accepted the chaplain's invitation for F/U spiritual care.

## 2020-12-03 NOTE — Progress Notes (Signed)
PHARMACY - TOTAL PARENTERAL NUTRITION CONSULT NOTE   Indication: Prolonged ileus  Patient Measurements: Height: 6' (182.9 cm) Weight: 69.4 kg (153 lb) IBW/kg (Calculated) : 77.6 TPN AdjBW (KG): 69.4 Body mass index is 20.75 kg/m. Usual Weight: 170 lbs  Assessment: Eric [name] is a 50 y.o. male no known medical history. Patient presented secondary to right groin pain and found to have a retroperitoneal abscess/bowel perforation/cecal mass concerning for neoplasm. Incidental finding of COVID-19 positive status. Surgery following, no surgical interventions at this time. Plans for short term management with drains and IV abx. Plan for surgical resection of R colon/retroperitoneal mass ideally in 6 weeks for more optimal surgical conditions.   Patient is s/p colon resection on 2/17. On 2/19 he vomited 500 ml green emesis and vomited again 2/20. Noted to have worsening ileus. NGT placed. Pharmacy consulted to resume TPN for prolonged ileus.   Glucose / Insulin: No history of diabetes. A1c 5.2. CBGs 110-160s. Utilized 9 units / 24 hrs.  Electrolytes: K 3.7 (goal >=4), Mg 1.8, Phos 3.4. Corrected Ca wnl, Cl 94>97. Other electrolytes wnl.  Renal: Scr 0.67 - stable. BUN 23.  LFTs / TGs: LFTs and Tbili WNL. TG 128.  Prealbumin / albumin: Prealbumin 31.5>21. Albumin 2.3.  Intake / Output; MIVF: UOP 0.3>0.8 ml/kg/hr. NGT output 1200>350 ml. R JP drain 480>165 ml/24 hrs. Ileostomy output 100 ml.  GI Imaging:  2/4 CT abd: long segment masslike thickening on cecum concerning for neoplasm, R retroperitoneal abscess 2/9 MRI abd: lesion in peripheral R liver - possible intrahepatic abscess vs. Metastatic disease. Dec fluid in R iliopsoas.  Surgeries / Procedures:  2/7: R retroperitoneal drain placed, replaced in OR 2/17 217: ex-lap, right colectomy, end ileostomy, cystoscopy with stent placement  Central access: Triple lumen PICC 11/18/20 TPN start date: 11/18/20-11/24/20; resumed 2/20  Nutritional Goals  (per RD recommendation on 2/16): Kcal:  2200-2430; Protein:  110-125; Fluid:  >/= 2.2 L Goal TPN rate is 100 mL/hr (provides 120 g of protein and an average of 2328 kcals per day - with 21 g/L lipids on MWF)  Current Nutrition:  NPO TPN  Plan:  Continue TPN at goal rate of 100 mL/hr Lipids MWF due to national shortage (previously held from 2/7 d/t shortage) Electrolytes in TPN: Cont no Ca from previous, Inc K to  40 mEq/L, inc phos to 17 mmol/L, inc Mg to 5 mEq/L, others no change. Cl:Ac 2:1.  Give Mag sulfate 1g IV x 1 Give KCl 51mEq IV x 2 Add standard MVI and trace elements to TPN Continue moderate q4h SSI and adjust as needed BMP, Mg, Phos in AM  Bertis Ruddy, PharmD Clinical Pharmacist Please check AMION for all [place] numbers 12/03/2020 7:46 AM

## 2020-12-03 NOTE — Progress Notes (Signed)
PROGRESS NOTE    Eric [name]  ZYS:[id number] DOB: May 01, 1971 DOA: 11/15/2020 PCP: [facility]   Chief Complaint  Patient presents with  . Groin Pain   Brief Narrative:50 year old male with no known PMH presented with right groin pain found to have retroperitoneal abscess with bowel perforation cecal mass,incidental Covid positive seen by surgery placed on TPN for severe protein calorie malnutrition and had IR drain the abscess.Patient underwent right hemicolectomy with ileostomy 2/17. Post-op course complicated by ileus, NGT was placed 2/20.  Patient is being managed with IV antibiotics leukocytosis much improved, remains on TPN with plan to wean off TPN as ileus improves.  Subjective: Seen and examined this morning.  NG tube was clamped. Drain output 165 mL still 100 mL with NG output 350  Assessment & Plan:  Perforated colon cancer with RTP abscess s/p right colectomy/ileostomy 11/28/2020, s/p ureteral stent placement. Stage IV adenocarcinoma / liver mets Postop ileus POD #6.  Appreciate surgery input NG tube being clamped as output has decreased , ileostomy bag with stool output.  Continue TPN, n.p.o. and PT OT pain control as per surgery. Cont IV Zosyn for antibiotics. Being followed by oncology-we will need port placement prior to discharge and outpatient oncology follow-up  Sepsis POA 2/2 perforated colon mass.  Sepsis physiology resolved.  On Zosyn since 11/15/20-completing today. Recent Labs  Lab 11/29/20 0352 11/30/20 0315 12/01/20 0321 12/01/20 0345 12/02/20 0343 12/03/20 0445  WBC 13.6* 17.5*  --  15.2* 8.8 10.8*  LATICACIDVEN  --   --  1.1  --   --   --    Blood loss anemia with hemoglobin 5.4 g on presentation in the setting of anemia with chronic GI bleed from colon cancer.  Status post PRBC x4 units.  Hemoglobin remained stable.  Monitor. Recent Labs  Lab 11/29/20 0352 11/30/20 0315 12/01/20 0345 12/02/20 0343 12/03/20 0445  HGB 9.6* 9.9*  9.4* 8.6* 8.9*  HCT 31.1* 33.2* 31.2* 28.8* 29.7*   Deconditioning/debility/fatigue: Due to complex comorbidities, continue PT OT.   Moderate protein calorie malnutrition NPO.  Continue TPN.   Thrombocytosis likely reactive.  Monitor Hyponatremia 120S on admission in the setting of #1.  Status post salt tablets.  Hyponatremia resolved. Hyperglycemic-likely reactive A1c normal. COVID-19 virus infection positive incidental/asymptomatic s/px3 days remdesivir,off isolation 2/15. Tobacco abuse: counseled.  Nutrition: Diet Order            Diet NPO time specified Except for: Ice Chips  Diet effective now                 Nutrition Problem: Moderate Malnutrition Etiology:  (illness) Signs/Symptoms: severe muscle depletion,moderate fat depletion,moderate muscle depletion Interventions: Ensure Enlive (each supplement provides 350kcal and 20 grams of protein),TPN Pt's Body mass index is 20.75 kg/m.  DVT prophylaxis: heparin injection 5,000 Units Start: 11/29/20 0800 SCDs Start: 11/15/20 2135 Code Status:   Code Status: Full Code  Family Communication: Plan of care discussed with patient at bedside. He is self updating his sister and friend  Status is: Inpatient Remains inpatient appropriate because:Inpatient level of care appropriate due to severity of illness and ongoing post op care- ngt  drain and TPN  Dispo: The patient is from: Home              Anticipated d/c is to: Home.  Reports he will be living with his sister while he is getting chemotherapy              Anticipated d/c date  is:2 days              Patient currently is not medically stable to d/c.   Difficult to place patient No   Consultants:see note  Procedures:see note  Unresulted Labs (From admission, onward)          Start     Ordered   12/04/20 2094  Basic metabolic panel  Tomorrow morning,   R       Question:  Specimen collection method  Answer:  IV Team=IV Team collect   12/03/20 0757   12/04/20 0500   Phosphorus  Tomorrow morning,   R       Question:  Specimen collection method  Answer:  IV Team=IV Team collect   12/03/20 0757   12/04/20 0500  Magnesium  Tomorrow morning,   R       Question:  Specimen collection method  Answer:  IV Team=IV Team collect   12/03/20 0757   12/02/20 0500  Comprehensive metabolic panel  (TPN Lab Panel)  Every Mon,Thu (0500),   R     Question:  Specimen collection method  Answer:  IV Team=IV Team collect   12/01/20 1056   12/02/20 0500  Magnesium  (TP[facility] Lab Panel)  Every Mon,Thu (0500),   R     Question:  Specimen collection method  Answer:  IV Team=IV Team collect   12/01/20 1056   12/02/20 0500  Phosphorus  (TP[facility] Lab Panel)  Every Mon,Thu (0500),   R     Question:  Specimen collection method  Answer:  IV Team=IV Team collect   12/01/20 1056   12/02/20 0500  CBC  (TP[facility] Lab Panel)  Every Monday (0500),   R     Question:  Specimen collection method  Answer:  IV Team=IV Team collect   12/01/20 1056   12/02/20 0500  Differential  (TP[facility] Lab Panel)  Every Monday (0500),   R     Question:  Specimen collection method  Answer:  IV Team=IV Team collect   12/01/20 1056   12/02/20 0500  Triglycerides  (TP[facility] Lab Panel)  Every Monday (0500),   R     Question:  Specimen collection method  Answer:  IV Team=IV Team collect   12/01/20 1056   12/02/20 0500  Prealbumin  (TP[facility] Lab Panel)  Every Monday (0500),   R     Question:  Specimen collection method  Answer:  IV Team=IV Team collect   12/01/20 1056   12/01/20 0500  CBC  Daily,   R     Question:  Specimen collection method  Answer:  IV Team=IV Team collect   11/30/20 0916          Culture/Microbiology    Component Value Date/Time   SDES BIOPSY LIVER 11/20/2020 1622   SPECREQUEST Normal 11/20/2020 1622   CULT  11/20/2020 1622    No growth aerobically or anaerobically. Performed at [facility], [place] [address] [place], [place] [address]    REPTSTATUS 11/25/2020 FINAL 11/20/2020 1622    Other culture-see  note  Medications: Scheduled Meds: . acetaminophen  1,000 mg Oral Q6H  . Chlorhexidine Gluconate Cloth  6 each Topical Daily  . feeding supplement  237 mL Oral QID  . heparin injection (subcutaneous)  5,000 Units Subcutaneous Q8H  . influenza vac split quadrivalent PF  0.5 mL Intramuscular Tomorrow-1000  . insulin aspart  0-15 Units Subcutaneous Q4H  . lidocaine  1 patch Transdermal Daily  . lip balm  1 application Topical BID  . nicotine  21 mg Transdermal Daily  . ondansetron (ZOFRAN) IV  4 mg Intravenous Once  . pantoprazole (PROTONIX) IV  40 mg Intravenous Q12H  . sodium chloride flush  5 mL Intracatheter Q8H   Continuous Infusions: . acetaminophen 1,000 mg (12/03/20 0838)  . lactated ringers    . methocarbamol (ROBAXIN) IV    . ondansetron (ZOFRAN) IV 8 mg (12/01/20 0152)  . piperacillin-tazobactam (ZOSYN)  IV 3.375 g (12/03/20 0624)  . TPN ADULT (ION) 100 mL/hr at 12/02/20 1740  . TPN ADULT (ION)      Antimicrobials: Anti-infectives (From admission, onward)   Start     Dose/Rate Route Frequency Ordered Stop   11/30/20 1045  ceFAZolin (ANCEF) IVPB 2g/100 mL premix  Status:  Discontinued        2 g 200 mL/hr over 30 Minutes Intravenous On call to O.R. 11/30/20 0957 11/30/20 1043   11/16/20 1000  remdesivir 100 mg in sodium chloride 0.9 % 100 mL IVPB       "Followed by" Linked Group Details   100 mg 200 mL/hr over 30 Minutes Intravenous Daily 11/15/20 2135 11/17/20 1002   11/15/20 2230  piperacillin-tazobactam (ZOSYN) IVPB 3.375 g        3.375 g 12.5 mL/hr over 240 Minutes Intravenous Every 8 hours 11/15/20 2144 12/03/20 2359   11/15/20 2134  remdesivir 200 mg in sodium chloride 0.9% 250 mL IVPB       "Followed by" Linked Group Details   200 mg 580 mL/hr over 30 Minutes Intravenous Once 11/15/20 2135 11/16/20 0029   11/15/20 1700  piperacillin-tazobactam (ZOSYN) IVPB 3.375 g        3.375 g 100 mL/hr over 30 Minutes Intravenous  Once 11/15/20 1657 11/15/20 1826      Objective: Vitals: Today's Vitals   12/03/20 0055 12/03/20 0401 12/03/20 0414 12/03/20 0444  BP:  (!) 134/101    Pulse:  90    Resp:  17    Temp:  (!) 97.5 F (36.4 C)    TempSrc:  Oral    SpO2:  97%    Weight:      Height:      PainSc: Asleep  8  Asleep    Intake/Output Summary (Last 24 hours) at 12/03/2020 1435 Last data filed at 12/03/2020 1300 Gross per 24 hour  Intake 400 ml  Output 1130 ml  Net -730 ml   Filed Weights   11/15/20 2000  Weight: 69.4 kg   Weight change:   Intake/Output from previous day: 02/21 0701 - 02/22 0700 In: 400 [P.O.:90; I.V.:10; IV Piggyback:300] Out: 1865 [Urine:1250; Emesis/NG output:350; Drains:165; Stool:100] Intake/Output this shift: No intake/output data recorded. Filed Weights   11/15/20 2000  Weight: 69.4 kg    Examination: General exam: AAOx3, old for his age, clean HEENT:Oral mucosa moist, Ear/Nose WNL grossly, dentition normal. Respiratory system: bilaterally clear,no wheezing or crackles,no use of accessory muscle Cardiovascular system: S1 & S2 +, No JVD,. Gastrointestinal system: Abdomen soft, surgical site c/d/i, NGT+,ND,BS+ Nervous System:Alert, awake, moving extremities and grossly nonfocal Extremities: No edema, distal peripheral pulses palpable.  Skin: No rashes,no icterus. MSK: Normal muscle bulk,tone, power.  Data Reviewed: I have personally reviewed following labs and imaging studies CBC: Recent Labs  Lab 11/29/20 0352 11/30/20 0315 12/01/20 0345 12/02/20 0343 12/03/20 0445  WBC 13.6* 17.5* 15.2* 8.8 10.8*  NEUTROABS  --  14.5* 12.4* 5.8  --   HGB 9.6* 9.9* 9.4* 8.6* 8.9*  HCT  31.1* 33.2* 31.2* 28.8* 29.7*  MCV 76.0* 77.0* 77.8* 78.7* 79.6*  PLT 840* 860* 896* 792* 657*   Basic Metabolic Panel: Recent Labs  Lab 11/28/20 0338 11/29/20 0352 11/30/20 0315 12/01/20 0345 12/01/20 0939 12/02/20 0343 12/03/20 0445  NA 131* 132* 132* 132*  --  135 135  K 4.6 4.6 4.2 3.6  --  3.5 3.7  CL 97* 97*  95* 91*  --  94* 97*  CO2 25 25 28 30   --  30 25  GLUCOSE 109* 126* 156* 144*  --  133* 153*  BUN 15 16 18 20   --  24* 23*  CREATININE 0.81 0.83 0.76 0.90  --  0.91 0.67  CALCIUM 8.9 9.2 8.9 9.1  --  9.1 8.9  MG 2.3  --   --   --  2.2 2.2 1.8  PHOS  --   --   --   --  4.5 4.2 3.4   GFR: Estimated Creatinine Clearance: 107.2 mL/min (by C-G formula based on SCr of 0.67 mg/dL). Liver Function Tests: Recent Labs  Lab 11/27/20 0341 12/02/20 0343  AST 20 14*  ALT 29 15  ALKPHOS 103 120  BILITOT 0.7 1.0  PROT 6.2* 6.1*  ALBUMIN 2.2* 2.3*   No results for input(s): LIPASE, AMYLASE in the last 168 hours. No results for input(s): AMMONIA in the last 168 hours. Coagulation Profile: No results for input(s): INR, PROTIME in the last 168 hours. Cardiac Enzymes: No results for input(s): CKTOTAL, CKMB, CKMBINDEX, TROPONINI in the last 168 hours. BNP (last 3 results) No results for input(s): PROBNP in the last 8760 hours. HbA1C: Recent Labs    12/01/20 0345  HGBA1C 5.2   CBG: Recent Labs  Lab 12/02/20 2018 12/03/20 0003 12/03/20 0404 12/03/20 0753 12/03/20 1154  GLUCAP 165* 162* 157* 150* 152*   Lipid Profile: Recent Labs    12/02/20 0343  TRIG 128   Thyroid Function Tests: No results for input(s): TSH, T4TOTAL, FREET4, T3FREE, THYROIDAB in the last 72 hours. Anemia Panel: No results for input(s): VITAMINB12, FOLATE, FERRITIN, TIBC, IRON, RETICCTPCT in the last 72 hours. Sepsis Labs: Recent Labs  Lab 12/01/20 0321  LATICACIDVEN 1.1    Recent Results (from the past 240 hour(s))  Surgical pcr screen     Status: None   Collection Time: 11/27/20 12:36 AM   Specimen: Nasal Mucosa; Nasal Swab  Result Value Ref Range Status   MRSA, PCR NEGATIVE NEGATIVE Final   Staphylococcus aureus NEGATIVE NEGATIVE Final    Comment: (NOTE) The Xpert SA Assay (FDA approved for NASAL specimens in patients 70 years of age and older), is one component of a comprehensive surveillance  program. It is not intended to diagnose infection nor to guide or monitor treatment. Performed at [facility], [place] [address], [place], [place] [address]      Radiology Studies: No results found.   LOS: 18 days   Antonieta Pert, MD [facility]  12/03/2020, 2:35 PM

## 2020-12-03 NOTE — Progress Notes (Signed)
Physical Therapy Treatment Patient Details Name: Eric [name] MRN: [id number] DOB: 14-Oct-1970 Today's Date: 12/03/2020    History of Present Illness Pt is a 50 y.o. male admitted 11/15/20 with R groin pain; incidental (+) COVID-19. 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 S/p percutaneous drain placement 2/7. Liver lesion biopsy 2/9. PMH includes tobacco use. S/p exploratory laparotomy, colectomy end ostomy placement and cystoscopy with stent.    PT Comments    Pt supine in bed on arrival this session. Pt pleasant and agreeable to PT session.  He continues to be guarded due to pain and reliant on RW for balance.  Continue to follow during acute hospital stay.     Follow Up Recommendations  No PT follow up     Equipment Recommendations  None recommended by PT    Recommendations for Other Services       Precautions / Restrictions Precautions Precautions: Fall;Other (comment) Precaution Comments: JP drain R and R ostomy pouch/ NGT Restrictions Weight Bearing Restrictions: No    Mobility  Bed Mobility Overal bed mobility: Needs Assistance Bed Mobility: Rolling;Sidelying to Sit;Sit to Sidelying Rolling: Supervision Sidelying to sit: Supervision     Sit to sidelying: Supervision General bed mobility comments: Pt able to perform log roll OOB and back to bed with supervision for safety with lines and leads.    Transfers Overall transfer level: Needs assistance Equipment used: Rolling walker (2 wheeled) Transfers: Sit to/from Stand Sit to Stand: Min guard         General transfer comment: Cues for hand placment to rise into standing.  Mild unsteady on intial sit to stand but no overt LOB.  Ambulation/Gait Ambulation/Gait assistance: Min guard Gait Distance (Feet): 300 Feet Assistive device: Rolling walker (2 wheeled) Gait Pattern/deviations: Step-through pattern;Narrow base of support;Trunk flexed      General Gait Details: Cues for forward gaze and trunk extension.  Mild scissoring with L over R but appears to be baseline.   Stairs             Wheelchair Mobility    Modified Rankin (Stroke Patients Only)       Balance Overall balance assessment: Needs assistance Sitting-balance support: Feet supported;Bilateral upper extremity supported Sitting balance-Leahy Scale: Good       Standing balance-Leahy Scale: Poor                              Cognition Arousal/Alertness: Awake/alert Behavior During Therapy: WFL for tasks assessed/performed Overall Cognitive Status: Within Functional Limits for tasks assessed                                 General Comments: appears to be his baseline      Exercises      General Comments        Pertinent Vitals/Pain Pain Assessment: Faces Faces Pain Scale: Hurts little more Pain Location: abdominal pain post op Pain Descriptors / Indicators: Grimacing;Guarding Pain Intervention(s): Monitored during session;Repositioned    Home Living                      Prior Function            PT Goals (current goals can now be found in the care plan section) Acute Rehab PT Goals Patient Stated Goal: to walk without the walker Potential  to Achieve Goals: Good Progress towards PT goals: Progressing toward goals    Frequency    Min 3X/week      PT Plan Current plan remains appropriate    Co-evaluation              AM-PAC PT "6 Clicks" Mobility   Outcome Measure  Help needed turning from your back to your side while in a flat bed without using bedrails?: A Little Help needed moving from lying on your back to sitting on the side of a flat bed without using bedrails?: A Little Help needed moving to and from a bed to a chair (including a wheelchair)?: A Little Help needed standing up from a chair using your arms (e.g., wheelchair or bedside chair)?: A Little Help needed to walk  in hospital room?: A Little Help needed climbing 3-5 steps with a railing? : A Little 6 Click Score: 18    End of Session Equipment Utilized During Treatment: Gait belt Activity Tolerance: Patient limited by pain Patient left: with nursing/sitter in room;in bed;with bed alarm set Nurse Communication: Mobility status PT Visit Diagnosis: Muscle weakness (generalized) (M62.81);Difficulty in walking, not elsewhere classified (R26.2);Pain Pain - Right/Left:  (incisional) Pain - part of body:  (abdomen)     Time: 8891-6945 PT Time Calculation (min) (ACUTE ONLY): 26 min  Charges:  $Gait Training: 8-22 mins $Therapeutic Activity: 8-22 mins                     Erasmo Leventhal , PTA Acute Rehabilitation Services Pager [phone] Office [phone]     Aimee Eli Hose 12/03/2020, 6:01 PM

## 2020-12-04 LAB — BASIC METABOLIC PANEL
Anion gap: 8 (ref 5–15)
BUN: 19 mg/dL (ref 6–20)
CO2: 25 mmol/L (ref 22–32)
Calcium: 8.5 mg/dL — ABNORMAL LOW (ref 8.9–10.3)
Chloride: 101 mmol/L (ref 98–111)
Creatinine, Ser: 0.5 mg/dL — ABNORMAL LOW (ref 0.61–1.24)
GFR, Estimated: >60 mL/min (ref >60)
Glucose, Bld: 144 mg/dL — ABNORMAL HIGH (ref 70–99)
Potassium: 4.1 mmol/L (ref 3.5–5.1)
Sodium: 134 mmol/L — ABNORMAL LOW (ref 135–145)

## 2020-12-04 LAB — SURGICAL PATHOLOGY

## 2020-12-04 LAB — GLUCOSE, CAPILLARY
Glucose-Capillary: 121 mg/dL — ABNORMAL HIGH (ref 70–99)
Glucose-Capillary: 124 mg/dL — ABNORMAL HIGH (ref 70–99)
Glucose-Capillary: 131 mg/dL — ABNORMAL HIGH (ref 70–99)
Glucose-Capillary: 134 mg/dL — ABNORMAL HIGH (ref 70–99)
Glucose-Capillary: 140 mg/dL — ABNORMAL HIGH (ref 70–99)
Glucose-Capillary: 144 mg/dL — ABNORMAL HIGH (ref 70–99)
Glucose-Capillary: 147 mg/dL — ABNORMAL HIGH (ref 70–99)

## 2020-12-04 LAB — MAGNESIUM: Magnesium: 2.1 mg/dL (ref 1.7–2.4)

## 2020-12-04 LAB — PHOSPHORUS: Phosphorus: 3.4 mg/dL (ref 2.5–4.6)

## 2020-12-04 MED ORDER — KETOROLAC TROMETHAMINE 15 MG/ML IJ SOLN
15.0000 mg | Freq: Four times a day (QID) | INTRAMUSCULAR | Status: DC | PRN
  Administered 2020-12-04 – 2020-12-09 (×13): 15 mg via INTRAVENOUS
  Filled 2020-12-04 (×15): qty 1

## 2020-12-04 MED ORDER — TRAVASOL 10 % IV SOLN
INTRAVENOUS | Status: AC
  Filled 2020-12-04: qty 1200

## 2020-12-04 NOTE — Progress Notes (Signed)
6 Days Post-Op  Subjective: NAEO. Ambulated in hall yesterday. Reports abdominal soreness but pain overall controlled. Denies urinary sxs. Having small amt dark stool in pouch but not much. Did have emesis during NG clamp trial yesterday and over 1L out of NG last 16 hours   ROS: See above, otherwise other systems negative  Objective: Vital signs in last 24 hours: Temp:  [97.9 F (36.6 C)-98.3 F (36.8 C)] 98.2 F (36.8 C) (02/23 0433) Pulse Rate:  [79-86] 79 (02/23 0433) Resp:  [18] 18 (02/23 0433) BP: (123-137)/(84-94) 123/94 (02/23 0433) SpO2:  [97 %-98 %] 97 % (02/23 0433) Last BM Date: 12/02/20  Intake/Output from previous day: 02/22 0701 - 02/23 0700 In: 0  Out: 2170 [Urine:650; Emesis/NG output:1100; Drains:160; Stool:260] Intake/Output this shift: No intake/output data recorded.   PE: Gen: alert, cooperative NAD Abd: soft, appropriately tender, +BS, mild distention, ileostomy in R hemiabdomen - edematous and viable, small amt dark stool in ostomy pouch. Midline wound dressing c/d/i  NG - 1100 cc/24h  JP -  SS   Lab Results:  Recent Labs    12/02/20 0343 12/03/20 0445  WBC 8.8 10.8*  HGB 8.6* 8.9*  HCT 28.8* 29.7*  PLT 792* 835*   BMET Recent Labs    12/03/20 0445 12/04/20 0400  NA 135 134*  K 3.7 4.1  CL 97* 101  CO2 25 25  GLUCOSE 153* 144*  BUN 23* 19  CREATININE 0.67 0.50*  CALCIUM 8.9 8.5*   PT/INR No results for input(s): LABPROT, INR in the last 72 hours. CMP     Component Value Date/Time   NA 134 (L) 12/04/2020 0400   K 4.1 12/04/2020 0400   CL 101 12/04/2020 0400   CO2 25 12/04/2020 0400   GLUCOSE 144 (H) 12/04/2020 0400   BUN 19 12/04/2020 0400   CREATININE 0.50 (L) 12/04/2020 0400   CALCIUM 8.5 (L) 12/04/2020 0400   PROT 6.1 (L) 12/02/2020 0343   ALBUMIN 2.3 (L) 12/02/2020 0343   AST 14 (L) 12/02/2020 0343   ALT 15 12/02/2020 0343   ALKPHOS 120 12/02/2020 0343   BILITOT 1.0 12/02/2020 0343   GFRNONAA >60 12/04/2020 0400    Lipase  No results found for: LIPASE     Studies/Results: No results found.  Anti-infectives: Anti-infectives (From admission, onward)   Start     Dose/Rate Route Frequency Ordered Stop   11/30/20 1045  ceFAZolin (ANCEF) IVPB 2g/100 mL premix  Status:  Discontinued        2 g 200 mL/hr over 30 Minutes Intravenous On call to O.R. 11/30/20 0957 11/30/20 1043   11/16/20 1000  remdesivir 100 mg in sodium chloride 0.9 % 100 mL IVPB       "Followed by" Linked Group Details   100 mg 200 mL/hr over 30 Minutes Intravenous Daily 11/15/20 2135 11/17/20 1002   11/15/20 2230  piperacillin-tazobactam (ZOSYN) IVPB 3.375 g        3.375 g 12.5 mL/hr over 240 Minutes Intravenous Every 8 hours 11/15/20 2144 12/03/20 2359   11/15/20 2134  remdesivir 200 mg in sodium chloride 0.9% 250 mL IVPB       "Followed by" Linked Group Details   200 mg 580 mL/hr over 30 Minutes Intravenous Once 11/15/20 2135 11/16/20 0029   11/15/20 1700  piperacillin-tazobactam (ZOSYN) IVPB 3.375 g        3.375 g 100 mL/hr over 30 Minutes Intravenous  Once 11/15/20 1657 11/15/20 1826  Assessment/Plan Anemia Hyponatremia Tobacco abuse COVID-19positive  Severe malnutrition - prealbumin 5.8 (2/8), continue TPN  Colonmass with perforationand RTP abscess Stage IV adenocarcinomacolon  S/P EXPLORATORY LAPAROTOMY, RIGHT COLECTOMY, END ILEOSTOMY CREATION, CYSTOSCOPY W/ STENT PLACEMENT 11/28/20 (Dr. Rosendo Gros; Dr. Abner Greenspan) - POD#6 - AFVSS, WBC 10.8 on 2/22 - s/p IR drain placement 2/7 - this drain was removed intra-operatively and new blake drain placed; IR cx Enterobacter aerogenes - post-op ileus >> NG replaced 2/20, continue to LIWS and aROBF - IV Zosyn completed 2/22 at [facility] - PRN analgesics and antiemetics  - OOB/IS  - will likely need port-a-cath prior to discharge, will discuss timing with MD  Liver metastasis  - x3 noted on MRI, unsure if abscess vs metastatic disease.IR liver biopsy 11/20/2020; -A.  LIVER, RIGHT MASS, NEEDLE CORE BIOPSY:  - Adenocarcinoma.  COMMENT:  The morphology suggests metastatic colorectal adenocarcinoma.  Immunohistochemistry will be performed and reported as an addendum.  There is sufficient tissue for additional testing.   ID -zosyn 2/4-2/22 FEN - NPO, TPN, continue NG to LIWS VTE:  SQ heparin Foley -D/C-ed 2/18 Dispo - post-op ileus, TPN, await bowel function    LOS: 19 days    Jill Alexanders , [facility] Surgery 12/04/2020, 9:37 AM Please see Amion for pager number during day hours 7:00am-4:30pm or 7:00am -11:30am on weekends

## 2020-12-04 NOTE — Progress Notes (Signed)
Pt still concerned about occasional numbness in right upper leg, what he originally went to the doctor for before ending up in the hospital.  Pt ambulated in the hallways twice today (once with sister, once with nursing student), was up to the recliner, gas chair, and sofa today. Pt rotated movement/out of bed/in bed approximately every hour today.  His pain level increased after walking. Toradol was prescribed by Dr. Maren Beach to assist in cutting down on need for dilaudid.  Robaxin was only given IV once on dayshift d/t it not coming up from pharmacy in time.

## 2020-12-04 NOTE — Progress Notes (Signed)
Physical Therapy Treatment Patient Details Name: Eric [name] MRN: [id number] DOB: Jul 29, 1971 Today's Date: 12/04/2020    History of Present Illness Pt is a 50 y.o. male admitted 11/15/20 with R groin pain; incidental (+) COVID-19. 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 S/p percutaneous drain placement 2/7. Liver lesion biopsy 2/9. PMH includes tobacco use. S/p exploratory laparotomy, colectomy end ostomy placement and cystoscopy with stent.    PT Comments    On arrival, patient had just returned from walking unit with assistance. Patient required frequent seated rest breaks throughout session. Performed seated and standing therex with RW and supervision.  Pain and decreased activity tolerance continue to limit patient. No PT follow up continues to remain appropriate.    Follow Up Recommendations  No PT follow up     Equipment Recommendations  None recommended by PT    Recommendations for Other Services       Precautions / Restrictions Precautions Precautions: Fall;Other (comment) Precaution Comments: JP drain R and R ostomy pouch/ NGT Restrictions Weight Bearing Restrictions: No    Mobility  Bed Mobility               General bed mobility comments: up in chair on arrival    Transfers Overall transfer level: Needs assistance Equipment used: Rolling walker (2 wheeled) Transfers: Sit to/from Stand Sit to Stand: Supervision         General transfer comment: supervision for safety, no LOB noted  Ambulation/Gait             General Gait Details: declined ambulation as he had just walked the unit prior to arrival   Stairs             Wheelchair Mobility    Modified Rankin (Stroke Patients Only)       Balance Overall balance assessment: Needs assistance Sitting-balance support: Feet supported;Bilateral upper extremity supported Sitting balance-Leahy Scale: Good      Standing balance support: Bilateral upper extremity supported Standing balance-Leahy Scale: Poor                              Cognition Arousal/Alertness: Awake/alert Behavior During Therapy: WFL for tasks assessed/performed Overall Cognitive Status: Within Functional Limits for tasks assessed                                        Exercises General Exercises - Lower Extremity Long Arc Quad: Both;10 reps;Seated Hip ABduction/ADduction: Both;10 reps;Seated (pillow squeeze) Hip Flexion/Marching: 10 reps;Seated;Standing (x10 reps seated, x10 reps standing) Toe Raises: Both;10 reps;Seated Heel Raises: Both;10 reps;Seated Mini-Sqauts: 10 reps    General Comments        Pertinent Vitals/Pain Pain Assessment: Faces Faces Pain Scale: Hurts little more Pain Location: abdominal pain post op Pain Descriptors / Indicators: Grimacing;Guarding Pain Intervention(s): Monitored during session    Home Living                      Prior Function            PT Goals (current goals can now be found in the care plan section) Acute Rehab PT Goals Patient Stated Goal: to walk without the walker PT Goal Formulation: With patient Time For Goal Achievement: 12/14/20 Potential to Achieve Goals: Good Progress towards PT goals: Progressing toward goals  Frequency    Min 3X/week      PT Plan Current plan remains appropriate    Co-evaluation              AM-PAC PT "6 Clicks" Mobility   Outcome Measure  Help needed turning from your back to your side while in a flat bed without using bedrails?: A Little Help needed moving from lying on your back to sitting on the side of a flat bed without using bedrails?: A Little Help needed moving to and from a bed to a chair (including a wheelchair)?: A Little Help needed standing up from a chair using your arms (e.g., wheelchair or bedside chair)?: A Little Help needed to walk in hospital room?: A  Little Help needed climbing 3-5 steps with a railing? : A Little 6 Click Score: 18    End of Session   Activity Tolerance: Patient tolerated treatment well Patient left: in chair;with call bell/phone within reach;with family/visitor present Nurse Communication: Mobility status PT Visit Diagnosis: Muscle weakness (generalized) (M62.81);Difficulty in walking, not elsewhere classified (R26.2);Pain Pain - part of body:  (abdomen)     Time: 3500-9381 PT Time Calculation (min) (ACUTE ONLY): 24 min  Charges:  $Therapeutic Exercise: 23-37 mins                     Alayna A. Gilford Rile PT, DPT Acute Rehabilitation Services Pager [phone] Office [phone]    Linna Hoff 12/04/2020, 5:28 PM

## 2020-12-04 NOTE — Progress Notes (Signed)
PHARMACY - TOTAL PARENTERAL NUTRITION CONSULT NOTE   Indication: Prolonged ileus  Patient Measurements: Height: 6' (182.9 cm) Weight: 69.4 kg (153 lb) IBW/kg (Calculated) : 77.6 TPN AdjBW (KG): 69.4 Body mass index is 20.75 kg/m. Usual Weight: 170 lbs  Assessment: Eric [name] is a 50 y.o. male no known medical history. Patient presented secondary to right groin pain and found to have a retroperitoneal abscess/bowel perforation/cecal mass concerning for neoplasm. Incidental finding of COVID-19 positive status. Surgery following, no surgical interventions at this time. Plans for short term management with drains and IV abx. Plan for surgical resection of R colon/retroperitoneal mass ideally in 6 weeks for more optimal surgical conditions.   Patient is s/p colon resection on 2/17. On 2/19 he vomited 500 ml green emesis and vomited again 2/20. Noted to have worsening ileus. NGT placed. Pharmacy consulted to resume TPN for prolonged ileus.   Glucose / Insulin: No history of diabetes. A1c 5.2. CBGs 130-150s. Used 15 units SSI/24h.  Electrolytes: K 4.1 (goal >=4), Mg 2.1, Phos 3.4. Corrected Ca wnl, HCO3 wnl, Cl up to wnl at 101. Marland Kitchen  Renal: Scr down 0.5, BUN down 19.  LFTs / TGs: LFTs and Tbili WNL. TG 128.  Prealbumin / albumin: Prealbumin 31.5 (2/14) >>21 (2/21). Albumin 2.3.  Intake / Output; MIVF: UOP 0.4 ml/kg/hr. Emesis when NGT clamped yesterday, NGT output/24h: 1100 cc,  R JP drain 160 ml/24 hrs. Ileostomy output 260 ml.  GI Imaging:  2/4 CT abd: long segment masslike thickening on cecum concerning for neoplasm, R retroperitoneal abscess 2/9 MRI abd: lesion in peripheral R liver - possible intrahepatic abscess vs. Metastatic disease. Dec fluid in R iliopsoas.  Surgeries / Procedures:  2/7: R retroperitoneal drain placed, replaced in OR 2/17 217: ex-lap, right colectomy, end ileostomy, cystoscopy with stent placement  Central access: Triple lumen PICC 11/18/20 TPN start date:  11/18/20-11/24/20; resumed 2/20  Nutritional Goals (per RD recommendation on 2/16): Kcal:  2200-2430; Protein:  110-125; Fluid:  >/= 2.2 L Goal TPN rate is 100 mL/hr (provides 120 g of protein and an average of 2328 kcals per day - with 21 g/L lipids on MWF)  Current Nutrition:  NPO TPN  Plan:  Continue TPN at goal rate of 100 mL/hr Lipids MWF due to national shortage (previously held from 2/7 d/t shortage) Electrolytes in TPN: incr Na to 172mEq/L, cont K at 18mEq/L, add back Ca 60mEq/L, cont Mg 40mEq/L, and adjust Phos back to 18mmol/L, cont Cl:Ac 2:1 Add standard MVI and trace elements to TPN Continue moderate q4h SSI and adjust as needed F/u TPN labs, BMET in the AM  Thank you for allowing pharmacy to be a part of this patient's care.  Alycia Rossetti, PharmD, BCPS Clinical Pharmacist Clinical phone for 12/04/2020: [phone] 12/04/2020 8:26 AM   **Pharmacist phone directory can now be found on [email or web address] ([email or web address]).  Listed under [place].

## 2020-12-04 NOTE — Plan of Care (Signed)
  Problem: Education: Goal: Knowledge of risk factors and measures for prevention of condition will improve Outcome: Progressing   

## 2020-12-04 NOTE — Progress Notes (Signed)
PROGRESS NOTE    BRENIN HEIDELBERGER  CBJ:[id number] DOB: [birth year] DOA: 11/15/2020 PCP: [facility], [facility]   Chief Complaint  Patient presents with  . Groin Pain   Brief Narrative:50 year old male with no known PMH presented with right groin pain found to have retroperitoneal abscess with bowel perforation cecal mass,incidental Covid positive seen by surgery placed on TPN for severe protein calorie malnutrition and had IR drain the abscess.Patient underwent right hemicolectomy with ileostomy 2/17. Post-op course complicated by ileus, NGT was placed 2/20.  Patient is being managed with IV antibiotics leukocytosis much improved, remains on TPN with plan to wean off TPN as ileus improves. 2/22-attempted NG clamping but he vomited  Subjective: Resting comfortably. Patient vomited after NG clamp attempt yesterday.   Afebrile overnight.   Some stool output in the ileostomy bag.  Assessment & Plan:  Perforated colon cancer with RTP abscess s/p right colectomy/ileostomy 11/28/2020, s/p ureteral stent placement. 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  Failed NG clamp attempt 2/22.  Continue NG tube decompression, TPN and ongoing management as per [facility] team.  Encourage ambulation PT OT.  Continue pain control.  Patient completed Zosyn.  Being followed by oncology-we will need port placement prior to discharge and outpatient oncology follow-up Out: 2170 [Urine:650; Emesis/NG output:1100; Drains:160; Stool:260]  Sepsis POA 2/2 perforated colon mass.  Sepsis resolved.  Completed antibiotics Zosyn 2/4- 2/22.  wbc 8K-10K  Blood loss anemia/microcytic anemia with hemoglobin 5.4 g on presentation in the setting of anemia with chronic GI bleed from colon cancer. s/p PRBC x4 units.  Remained stable.  Monitor Recent Labs  Lab 11/29/20 0352 11/30/20 0315 12/01/20 0345 12/02/20 0343 12/03/20 0445  HGB 9.6* 9.9* 9.4* 8.6*  8.9*  HCT 31.1* 33.2* 31.2* 28.8* 29.7*   Deconditioning/debility/fatigue: Encourage PT OT.  No further PT OT needed.    Moderate protein calorie malnutrition NPO.  Continue TPN, p.o. trial after return of bowel function  Thrombocytosis likely reactive.  Last platelet count 835K Hyponatremia 120S on admission in the setting of #1.  Stable.  1 34-1 35 now. Hyperglycemic-likely reactive A1c normal. COVID-19 virus infection positive incidental/asymptomatic s/px3 days remdesivir,off isolation 2/15. Tobacco abuse: He has been counseled  Nutrition: Diet Order            Diet NPO time specified Except for: Ice Chips  Diet effective now                 Nutrition Problem: Moderate Malnutrition Etiology:  (illness) Signs/Symptoms: severe muscle depletion,moderate fat depletion,moderate muscle depletion Interventions: Ensure Enlive (each supplement provides 350kcal and 20 grams of protein),TPN Pt's Body mass index is 20.75 kg/m.  DVT prophylaxis: heparin injection 5,000 Units Start: 11/29/20 0800 SCDs Start: 11/15/20 2135 Code Status:   Code Status: Full Code  Family Communication: Plan of care discussed with patient at bedside. He is self updating his sister and friend  Status is: Inpatient Remains inpatient appropriate because:Inpatient level of care appropriate due to severity of illness and ongoing post op care- ngt  drain and TPN  Dispo: The patient is from: Home              Anticipated d/c is to: Home.  Reports he will be living with his sister while he is getting chemotherapy              Anticipated d/c date is:2-3. Awaiting on return of bowel function  Patient currently is not medically stable to d/c.   Difficult to place patient No   Consultants:see note  Procedures:see note  Unresulted Labs (From admission, onward)          Start     Ordered   12/02/20 0500  Comprehensive metabolic panel  (TPN Lab Panel)  Every Mon,Thu (0500),   R     Question:   Specimen collection method  Answer:  IV Team=IV Team collect   12/01/20 1056   12/02/20 0500  Magnesium  (TPN Lab Panel)  Every Mon,Thu (0500),   R     Question:  Specimen collection method  Answer:  IV Team=IV Team collect   12/01/20 1056   12/02/20 0500  Phosphorus  (TPN Lab Panel)  Every Mon,Thu (0500),   R     Question:  Specimen collection method  Answer:  IV Team=IV Team collect   12/01/20 1056   12/02/20 0500  CBC  (TPN Lab Panel)  Every Monday (0500),   R     Question:  Specimen collection method  Answer:  IV Team=IV Team collect   12/01/20 1056   12/02/20 0500  Differential  (TPN Lab Panel)  Every Monday (0500),   R     Question:  Specimen collection method  Answer:  IV Team=IV Team collect   12/01/20 1056   12/02/20 0500  Triglycerides  (TPN Lab Panel)  Every Monday (0500),   R     Question:  Specimen collection method  Answer:  IV Team=IV Team collect   12/01/20 1056   12/02/20 0500  Prealbumin  (TPN Lab Panel)  Every Monday (0500),   R     Question:  Specimen collection method  Answer:  IV Team=IV Team collect   12/01/20 1056   12/01/20 0500  CBC  Daily,   R     Question:  Specimen collection method  Answer:  IV Team=IV Team collect   11/30/20 0916          Culture/Microbiology    Component Value Date/Time   SDES BIOPSY LIVER 11/20/2020 1622   SPECREQUEST Normal 11/20/2020 1622   CULT  11/20/2020 1622    No growth aerobically or anaerobically. Performed at [facility], [place] [address], [place], [place] [address]    REPTSTATUS 11/25/2020 FINAL 11/20/2020 1622    Other culture-see note  Medications: Scheduled Meds: . acetaminophen  1,000 mg Oral Q6H  . Chlorhexidine Gluconate Cloth  6 each Topical Daily  . feeding supplement  237 mL Oral QID  . heparin injection (subcutaneous)  5,000 Units Subcutaneous Q8H  . influenza vac split quadrivalent PF  0.5 mL Intramuscular Tomorrow-1000  . insulin aspart  0-15 Units Subcutaneous Q4H  . lidocaine  1 patch  Transdermal Daily  . lip balm  1 application Topical BID  . nicotine  21 mg Transdermal Daily  . pantoprazole (PROTONIX) IV  40 mg Intravenous Q12H  . sodium chloride flush  5 mL Intracatheter Q8H   Continuous Infusions: . lactated ringers    . methocarbamol (ROBAXIN) IV 1,000 mg (12/04/20 0606)  . ondansetron [facility]) IV 8 mg (12/01/20 0152)  . TPN ADULT (ION) 100 mL/hr at 12/03/20 1749    Antimicrobials: Anti-infectives (From admission, onward)   Start     Dose/Rate Route Frequency Ordered Stop   11/30/20 1045  ceFAZolin (ANCEF) IVPB 2g/100 mL premix  Status:  Discontinued        2 g 200 mL/hr over 30 Minutes Intravenous  On call to O.R. 11/30/20 0957 11/30/20 1043   11/16/20 1000  remdesivir 100 mg in sodium chloride 0.9 % 100 mL IVPB       "Followed by" Linked Group Details   100 mg 200 mL/hr over 30 Minutes Intravenous Daily 11/15/20 2135 11/17/20 1002   11/15/20 2230  piperacillin-tazobactam (ZOSYN) IVPB 3.375 g        3.375 g 12.5 mL/hr over 240 Minutes Intravenous Every 8 hours 11/15/20 2144 12/03/20 2359   11/15/20 2134  remdesivir 200 mg in sodium chloride 0.9% 250 mL IVPB       "Followed by" Linked Group Details   200 mg 580 mL/hr over 30 Minutes Intravenous Once 11/15/20 2135 11/16/20 0029   11/15/20 1700  piperacillin-tazobactam (ZOSYN) IVPB 3.375 g        3.375 g 100 mL/hr over 30 Minutes Intravenous  Once 11/15/20 1657 11/15/20 1826     Objective: Vitals: Today's Vitals   12/04/20 0400 12/04/20 0433 12/04/20 0621 12/04/20 0651  BP:  (!) 123/94    Pulse:  79    Resp:  18    Temp:  98.2 F (36.8 C)    TempSrc:  Oral    SpO2:  97%    Weight:      Height:      PainSc: Asleep  7  2     Intake/Output Summary (Last 24 hours) at 12/04/2020 0828 Last data filed at 12/04/2020 0816 Gross per 24 hour  Intake 0 ml  Output 2170 ml  Net -2170 ml   Filed Weights   11/15/20 2000  Weight: 69.4 kg   Weight change:   Intake/Output from previous day: 02/22 0701  - 02/23 0700 In: 0  Out: 2170 [Urine:650; Emesis/NG output:1100; Drains:160; Stool:260] Intake/Output this shift: No intake/output data recorded. Filed Weights   11/15/20 2000  Weight: 69.4 kg    Examination: General exam: AAOx3, thin, frail NGT+ HEENT:Oral mucosa moist, Ear/Nose WNL grossly, dentition normal. Respiratory system: bilaterally clear,no wheezing or crackles,no use of accessory muscle Cardiovascular system: S1 & S2 +, No JVD,. Gastrointestinal system: Abdomen soft, NT,ND, BS+ Midline dressing clean dry intact Nervous System:Alert, awake, moving extremities and grossly nonfocal Extremities: No edema, distal peripheral pulses palpable.  Skin: No rashes,no icterus. MSK: Normal muscle bulk,tone, power.  Data Reviewed: I have personally reviewed following labs and imaging studies CBC: Recent Labs  Lab 11/29/20 0352 11/30/20 0315 12/01/20 0345 12/02/20 0343 12/03/20 0445  WBC 13.6* 17.5* 15.2* 8.8 10.8*  NEUTROABS  --  14.5* 12.4* 5.8  --   HGB 9.6* 9.9* 9.4* 8.6* 8.9*  HCT 31.1* 33.2* 31.2* 28.8* 29.7*  MCV 76.0* 77.0* 77.8* 78.7* 79.6*  PLT 840* 860* 896* 792* 607*   Basic Metabolic Panel: Recent Labs  Lab 11/28/20 0338 11/29/20 0352 11/30/20 0315 12/01/20 0345 12/01/20 0939 12/02/20 0343 12/03/20 0445 12/04/20 0400  NA 131*   < > 132* 132*  --  135 135 134*  K 4.6   < > 4.2 3.6  --  3.5 3.7 4.1  CL 97*   < > 95* 91*  --  94* 97* 101  CO2 25   < > 28 30  --  30 25 25   GLUCOSE 109*   < > 156* 144*  --  133* 153* 144*  BUN 15   < > 18 20  --  24* 23* 19  CREATININE 0.81   < > 0.76 0.90  --  0.91 0.67 0.50*  CALCIUM 8.9   < >  8.9 9.1  --  9.1 8.9 8.5*  MG 2.3  --   --   --  2.2 2.2 1.8 2.1  PHOS  --   --   --   --  4.5 4.2 3.4 3.4   < > = values in this interval not displayed.   GFR: Estimated Creatinine Clearance: 107.2 mL/min (A) (by C-G formula based on SCr of 0.5 mg/dL (L)). Liver Function Tests: Recent Labs  Lab 12/02/20 0343  AST 14*  ALT  15  ALKPHOS 120  BILITOT 1.0  PROT 6.1*  ALBUMIN 2.3*   No results for input(s): LIPASE, AMYLASE in the last 168 hours. No results for input(s): AMMONIA in the last 168 hours. Coagulation Profile: No results for input(s): INR, PROTIME in the last 168 hours. Cardiac Enzymes: No results for input(s): CKTOTAL, CKMB, CKMBINDEX, TROPONINI in the last 168 hours. BNP (last 3 results) No results for input(s): PROBNP in the last 8760 hours. HbA1C: No results for input(s): HGBA1C in the last 72 hours. CBG: Recent Labs  Lab 12/03/20 1648 12/03/20 1958 12/04/20 0003 12/04/20 0426 12/04/20 0737  GLUCAP 134* 131* 147* 140* 144*   Lipid Profile: Recent Labs    12/02/20 0343  TRIG 128   Thyroid Function Tests: No results for input(s): TSH, T4TOTAL, FREET4, T3FREE, THYROIDAB in the last 72 hours. Anemia Panel: No results for input(s): VITAMINB12, FOLATE, FERRITIN, TIBC, IRON, RETICCTPCT in the last 72 hours. Sepsis Labs: Recent Labs  Lab 12/01/20 0321  LATICACIDVEN 1.1    Recent Results (from the past 240 hour(s))  Surgical pcr screen     Status: None   Collection Time: 11/27/20 12:36 AM   Specimen: Nasal Mucosa; Nasal Swab  Result Value Ref Range Status   MRSA, PCR NEGATIVE NEGATIVE Final   Staphylococcus aureus NEGATIVE NEGATIVE Final    Comment: (NOTE) The Xpert SA Assay (FDA approved for NASAL specimens in patients 70 years of age and older), is one component of a comprehensive surveillance program. It is not intended to diagnose infection nor to guide or monitor treatment. Performed at [facility], [place] [address] [place], [place] [address]      Radiology Studies: No results found.   LOS: 19 days   Antonieta Pert, MD [facility]  12/04/2020, 8:28 AM

## 2020-12-05 ENCOUNTER — Encounter: Payer: Self-pay | Admitting: *Deleted

## 2020-12-05 LAB — COMPREHENSIVE METABOLIC PANEL
ALT: 42 U/L (ref 0–44)
AST: 25 U/L (ref 15–41)
Albumin: 2.3 g/dL — ABNORMAL LOW (ref 3.5–5.0)
Alkaline Phosphatase: 149 U/L — ABNORMAL HIGH (ref 38–126)
Anion gap: 9 (ref 5–15)
BUN: 25 mg/dL — ABNORMAL HIGH (ref 6–20)
CO2: 21 mmol/L — ABNORMAL LOW (ref 22–32)
Calcium: 8.8 mg/dL — ABNORMAL LOW (ref 8.9–10.3)
Chloride: 104 mmol/L (ref 98–111)
Creatinine, Ser: 0.54 mg/dL — ABNORMAL LOW (ref 0.61–1.24)
GFR, Estimated: >60 mL/min (ref >60)
Glucose, Bld: 131 mg/dL — ABNORMAL HIGH (ref 70–99)
Potassium: 4.2 mmol/L (ref 3.5–5.1)
Sodium: 134 mmol/L — ABNORMAL LOW (ref 135–145)
Total Bilirubin: 0.8 mg/dL (ref 0.3–1.2)
Total Protein: 6.1 g/dL — ABNORMAL LOW (ref 6.5–8.1)

## 2020-12-05 LAB — PHOSPHORUS: Phosphorus: 3.6 mg/dL (ref 2.5–4.6)

## 2020-12-05 LAB — GLUCOSE, CAPILLARY
Glucose-Capillary: 132 mg/dL — ABNORMAL HIGH (ref 70–99)
Glucose-Capillary: 132 mg/dL — ABNORMAL HIGH (ref 70–99)
Glucose-Capillary: 131 mg/dL — ABNORMAL HIGH (ref 70–99)
Glucose-Capillary: 111 mg/dL — ABNORMAL HIGH (ref 70–99)
Glucose-Capillary: 121 mg/dL — ABNORMAL HIGH (ref 70–99)
Glucose-Capillary: 123 mg/dL — ABNORMAL HIGH (ref 70–99)

## 2020-12-05 LAB — MAGNESIUM: Magnesium: 2 mg/dL (ref 1.7–2.4)

## 2020-12-05 MED ORDER — TRAVASOL 10 % IV SOLN: INTRAVENOUS | Status: DC

## 2020-12-05 MED ORDER — TRAVASOL 10 % IV SOLN
INTRAVENOUS | Status: AC
  Filled 2020-12-05: qty 1200

## 2020-12-05 MED ORDER — HYDROMORPHONE HCL 1 MG/ML IJ SOLN
0.5000 mg | INTRAMUSCULAR | Status: DC | PRN
  Administered 2020-12-05: 0.5 mg via INTRAVENOUS
  Administered 2020-12-06 – 2020-12-14 (×27): 1 mg via INTRAVENOUS
  Filled 2020-12-05 (×29): qty 1

## 2020-12-05 MED ORDER — OXYCODONE HCL 5 MG PO TABS
5.0000 mg | ORAL_TABLET | ORAL | Status: DC | PRN
  Administered 2020-12-05: 5 mg via ORAL
  Administered 2020-12-06: 10 mg via ORAL
  Administered 2020-12-06: 5 mg via ORAL
  Administered 2020-12-06 – 2020-12-11 (×11): 10 mg via ORAL
  Administered 2020-12-13: 5 mg via ORAL
  Administered 2020-12-14: 10 mg via ORAL
  Filled 2020-12-05 (×6): qty 2
  Filled 2020-12-05: qty 1
  Filled 2020-12-05 (×3): qty 2
  Filled 2020-12-05: qty 1
  Filled 2020-12-05 (×4): qty 2
  Filled 2020-12-05: qty 1
  Filled 2020-12-05: qty 2

## 2020-12-05 MED ORDER — BOOST / RESOURCE BREEZE PO LIQD CUSTOM
1.0000 | Freq: Two times a day (BID) | ORAL | Status: DC
  Administered 2020-12-06: 1 via ORAL

## 2020-12-05 NOTE — Progress Notes (Signed)
Nutrition Follow-up  DOCUMENTATION CODES:   Non-severe (moderate) malnutrition in context of chronic illness  INTERVENTION:   -Boost Breeze po BID, each supplement provides 250 kcal and 9 grams of protein -TPN management per pharmacy -RD will follow for diet advancement and adjust supplement regimen as appropriate  NUTRITION DIAGNOSIS:   Moderate Malnutrition related to  (illness) as evidenced by severe muscle depletion,moderate fat depletion,moderate muscle depletion.  Ongoing  GOAL:   Patient will meet greater than or equal to 90% of their needs  Progressing   MONITOR:   PO intake,Supplement acceptance,Skin,Weight trends,Labs,I & O's (TPN tolerance)  REASON FOR ASSESSMENT:   Malnutrition Screening Tool    ASSESSMENT:   50 year old male with history significant for tobacco abuse admitted with sepsis due to right retroperitoneal abscess and found to be positive for COVID-19. Pt who has not had any routine medical care for the last 10 years presents with 5 day onset of significant right groin pain that has been progressive and constant associated with chills and night sweats.  2/7 TPN initiated, s/p retroperitoneal abscess drain placement 2/9 Liver lesion biopsy,significant for colorectal adenocarcinoma 2/12- advanced to soft diet  2/13- TPN d/c, downgraded to full liquid diet 2/17- s/p Procedure(s) with comments: EXPLORATORY LAPAROTOMY (N/A) - DOW CASE TO START AT 730AM 4 HOUR CASE RIGHT COLECTOMY (Right) ILEOSTOMY CREATION (N/A) CYSTOSCOPY WITH STENT PLACEMENT (N/A) 2/20- NGT inserted, TPN initiated 2/24- NGT d/c, advanced to clear liquid diet  Reviewed I/O's: -3.1 L x 24 hours and -15.6 L since 11/21/20  UOP: 1.3 L x 24 hours  NGT output: 1.6 L x 24 hours  Drain output: 160 ml x 24 hours  Ileostomy output: 800 ml x 24 hours  Pt unavailable at time of visit.   Pt just advanced to clear liquid diet. No meal completion data available yet.   Pt remains on  TPN, now receiving lipids Monday, Wednesday, and Friday due to [employer]. Pt receiving TPN at 100 ml/hr, which provides 2112 kcals and 120 grams protein, meeting 96% of estimated kcal needs and 100% of estimated protein needs.   Labs reviewed: Na: 134, K, Mg, and Phos WDL. CBGS: 111-132 (inpatent orders for glycemic control are 0-15 units insulin aspart every 4 hours).  Diet Order:   Diet Order            Diet clear liquid Room service appropriate? Yes; Fluid consistency: Thin  Diet effective now                 EDUCATION NEEDS:   Not appropriate for education at this time  Skin:  Skin Assessment: Skin Integrity Issues: Skin Integrity Issues:: Incisions Incisions: rt upper adomen, abdomen, and perineum  Last BM:  12/02/20 (100 ml via ileostomy)  Height:   Ht Readings from Last 1 Encounters:  11/15/20 6' (1.829 m)    Weight:   Wt Readings from Last 1 Encounters:  11/15/20 69.4 kg   BMI:  Body mass index is 20.75 kg/m.  Estimated Nutritional Needs:   Kcal:  2200-2430  Protein:  110-125 grams  Fluid:  >/= 2.2 L    Loistine Chance, RD, LDN, [place] Registered Dietitian II Certified Diabetes Care and Education Specialist Please refer to [facility] for RD and/or RD on-call/weekend/after hours pager

## 2020-12-05 NOTE — Progress Notes (Addendum)
Pt tolerated NG tube clamping. Removed NG tube per order at 1515. Pt tolerating clear fluids.  Abdominal dressing change at 1700. Minimal serosanguinous drainage from lower portion of incision. Two places are unapproximated. At the very bottom, between the lower two staples, it is open 0.5 cm. The space between staples above that is open 1 cm.  Pt emptied ileostomy bag once on his own. Pt sister emptied ileostomy bag once on her own. Sister emptied JP drain with supervision. She wanted to practice in case pt goes home with drain.

## 2020-12-05 NOTE — Progress Notes (Signed)
Per Dr. Benay Spice: Email to [facility] pathology requesting [facility] testing on liver case #[id number] dated 11/20/20.  Dx: C18.9 Stage IV

## 2020-12-05 NOTE — Plan of Care (Signed)
  Problem: Education: Goal: Knowledge of risk factors and measures for prevention of condition will improve Outcome: Progressing   

## 2020-12-05 NOTE — TOC Initial Note (Addendum)
Transition of Care [facility]) - Initial/Assessment Note    Patient Details  Name: Eric [name] MRN: [id number] Date of Birth: September 22, 1971  Transition of Care [facility]) CM/SW Contact:    Marilu Favre, RN Phone Number: 12/05/2020, 1:03 PM  Clinical Narrative:                  Spoke to patient and sister at bedside.   Confirmed face sheet address. PCP is [place] last visit was November 15, 2020 he does not remember MD name.   Patient from home with roommate ( address in [place]) . Patient plans to stay with his sister when roommate is at work. Sister's address is : [address], [place], [place] [address].  Best contact number for patient is [phone].  Sister at bedside will assist him with ostomy care, dressing changes and drain care. Education will be provided by hospital nurse prior to discharge. Discussed NCM will make a  referral to [facility] to see if patient qualifies for charity [facility]. If accepted patient and family will still need to learn wound, ostomy and drain care. Both voiced understanding.   Patient asking for assistance to apply for medicaid. NCM called financial counselor and left message. Explained they can file through [place]. Sister stated she has gone through the process before for her husband.   Referral made to [place] with [facility]. [place] will review clinicals etc and call NCM with determination.   Will assist patient with discharge medications through [place] [facility] program.  Patient and sister voiced understanding to all of above. Expected Discharge Plan: [place] Barriers to Discharge: Continued Medical Work up   Patient Goals and CMS Choice Patient states their goals for this hospitalization and ongoing recovery are:: to return to home CMS Medicare.gov Compare Post Acute Care list provided to:: Patient Choice offered to / list presented to : Patient  Expected Discharge Plan and Services Expected Discharge  Plan: [place]   Discharge Planning Services: CM Consult Post Acute Care Choice: [place] arrangements for the past 2 months: Single Family Home                           HH Arranged: RN          Prior Living Arrangements/Services Living arrangements for the past 2 months: Single Family Home Lives with:: Roommate Patient language and need for interpreter reviewed:: Yes Do you feel safe going back to the place where you live?: Yes      Need for Family Participation in Patient Care: Yes (Comment) Care giver support system in place?: Yes (comment)   Criminal Activity/Legal Involvement Pertinent to Current Situation/Hospitalization: No - Comment as needed  Activities of Daily Living Home Assistive Devices/Equipment: Cane (specify quad or straight) (d/t RLQ abd and R groin pain) ADL Screening (condition at time of admission) Patient's cognitive ability adequate to safely complete daily activities?: Yes Is the patient deaf or have difficulty hearing?: No Does the patient have difficulty seeing, even when wearing glasses/contacts?: No Does the patient have difficulty concentrating, remembering, or making decisions?: No Patient able to express need for assistance with ADLs?: Yes Does the patient have difficulty dressing or bathing?: No Independently performs ADLs?: Yes (appropriate for developmental age) Does the patient have difficulty walking or climbing stairs?: No Weakness of Legs: None Weakness of Arms/Hands: None  Permission Sought/Granted   Permission granted  to share information with : Yes, Verbal Permission Granted  Share Information with NAME: sister Eric [name]           Emotional Assessment Appearance:: Appears stated age     Orientation: : Oriented to Self,Oriented to Place,Oriented to  Time,Oriented to Situation Alcohol / Substance Use: Not Applicable Psych Involvement: No (comment)  Admission diagnosis:  Retroperitoneal abscess  ([place]) [K68.19] Thrombocytosis [D75.839] Bandemia [D72.825] Right lower quadrant abdominal mass [R19.03] Perforation of cecum [K35.32] Right lower quadrant pain [R10.31] Colonic mass [K63.89] Symptomatic anemia [D64.9] Anemia, unspecified type [D64.9] JQGBE-01 [U07.1] Patient Active Problem List   Diagnosis Date Noted  . Perforated colon cancer s/p right colectomy/ileostomy 11/28/2020 12/01/2020  . Malnutrition of moderate degree 12/01/2020  . 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 ([place]) 11/21/2020  . Protein-calorie malnutrition, severe ([place]) 11/18/2020  . Liver mass, right lobe 11/18/2020  . Right groin pain 11/18/2020  . Ambulates with cane 11/18/2020  . Tobacco abuse 11/18/2020  . Abdominal pain, RLQ (right lower quadrant) 11/18/2020  . Fatigue 11/18/2020  . Symptomatic anemia 11/18/2020  . Colonic mass 11/15/2020  . Iron deficiency anemia due to chronic blood loss 11/15/2020  . Sepsis ([place]) 11/15/2020  . COVID-19 virus infection 11/15/2020  . Thrombocytosis 11/15/2020  . Retroperitoneal abscess ([place]) 11/15/2020  . Hyponatremia 11/15/2020   PCP:  [facility]:   [place], [place] - [address] [place] [address] Phone: [phone] Fax: [phone]  [facility] [place], [place] [address] [place] [address] Phone: [phone] Fax: [phone]     Social Determinants of Health (SDOH) Interventions    Readmission Risk Interventions No flowsheet data found.

## 2020-12-05 NOTE — Progress Notes (Signed)
PROGRESS NOTE    Eric [name]  CHE:[id number] DOB: 08/23/1971 DOA: 11/15/2020 PCP: [facility], [facility]   Chief Complaint  Patient presents with  . Groin Pain   Brief Narrative:50 year old male with no known PMH presented with right groin pain found to have retroperitoneal abscess with bowel perforation cecal mass,incidental Covid positive seen by surgery placed on TPN for severe protein calorie malnutrition and had IR drain the abscess.Patient underwent right hemicolectomy with ileostomy 2/17. Post-op course complicated by ileus, NGT was placed 2/20.  Patient is being managed with IV antibiotics leukocytosis much improved, remains on TPN with plan to wean off TPN as ileus improves. 2/22-attempted NG clamping but he vomited 2/24-NG clamped again  Subjective: Resting comfortably no new complaints.   NG is clamped.   Had a lot of output from the colostomy.   Emesis/NG output:1550; Drains:160; Stool:800  Assessment & Plan:  Perforated colon cancer with RTP abscess s/p right colectomy/ileostomy 11/28/20, s/p ureteral stent placement. 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  CCS following appreciate input, NG clamp trial today continue TPN until return of bowel function.  Continue PT OT pain management minimize narcotic ordered ToradolPatient completed Zosyn.  Being followed by oncology-we will need port placement prior to discharge and outpatient oncology follow-up  Sepsis POA 2/2 perforated colon mass.  Sepsis resolved.  Completed antibiotics Zosyn 2/4- 2/22.  wbcstable  Blood loss anemia/microcytic anemia with hemoglobin 5.4 g on presentation in the setting of anemia with chronic GI bleed from colon cancer. s/p PRBC x4 units.  H&H stable Recent Labs  Lab 11/29/20 0352 11/30/20 0315 12/01/20 0345 12/02/20 0343 12/03/20 0445  HGB 9.6* 9.9* 9.4* 8.6* 8.9*  HCT 31.1* 33.2* 31.2* 28.8* 29.7*    Deconditioning/debility/fatigue: Continue PT OT.  Doing well  Moderate protein calorie malnutrition NPO.  Continue TPN.  Thrombocytosis likely reactive.  Remains elevated.  Monitor intermittently Hyponatremia 120S on admission in the setting of #1.  Overall stable.  Monitor. Hyperglycemic-likely reactive A1c normal. COVID-19 virus infection positive incidental/asymptomatic s/px3 days remdesivir,off isolation 2/15. Tobacco abuse: He has been counseled  Nutrition: Diet Order            Diet NPO time specified Except for: Ice Chips  Diet effective now                 Nutrition Problem: Moderate Malnutrition Etiology:  (illness) Signs/Symptoms: severe muscle depletion,moderate fat depletion,moderate muscle depletion Interventions: Ensure Enlive (each supplement provides 350kcal and 20 grams of protein),TPN Pt's Body mass index is 20.75 kg/m.  DVT prophylaxis: heparin injection 5,000 Units Start: 11/29/20 0800 SCDs Start: 11/15/20 2135 Code Status:   Code Status: Full Code  Family Communication: Plan of care discussed with patient at bedside. He is self updating his sister and friend  Status is: Inpatient Remains inpatient appropriate because:Inpatient level of care appropriate due to severity of illness and ongoing post op care- ngt  drain and TPN  Dispo: The patient is from: Home              Anticipated d/c is to: Home.Reports he will be living with his sister while he is getting chemotherapy              Anticipated d/c date is:2 days once bowel function returns and tolerating PO.               Patient currently is not medically stable to d/c.   Difficult to place  patient No   Consultants:see note  Procedures:see note  Unresulted Labs (From admission, onward)          Start     Ordered   12/06/20 7619  Basic metabolic panel  Tomorrow morning,   R       Question:  Specimen collection method  Answer:  IV Team=IV Team collect   12/05/20 0947   12/02/20 0500   Comprehensive metabolic panel  (TPN Lab Panel)  Every Mon,Thu (0500),   R     Question:  Specimen collection method  Answer:  IV Team=IV Team collect   12/01/20 1056   12/02/20 0500  Magnesium  (TPN Lab Panel)  Every Mon,Thu (0500),   R     Question:  Specimen collection method  Answer:  IV Team=IV Team collect   12/01/20 1056   12/02/20 0500  Phosphorus  (TPN Lab Panel)  Every Mon,Thu (0500),   R     Question:  Specimen collection method  Answer:  IV Team=IV Team collect   12/01/20 1056   12/02/20 0500  CBC  (TPN Lab Panel)  Every Monday (0500),   R     Question:  Specimen collection method  Answer:  IV Team=IV Team collect   12/01/20 1056   12/02/20 0500  Differential  (TPN Lab Panel)  Every Monday (0500),   R     Question:  Specimen collection method  Answer:  IV Team=IV Team collect   12/01/20 1056   12/02/20 0500  Triglycerides  (TPN Lab Panel)  Every Monday (0500),   R     Question:  Specimen collection method  Answer:  IV Team=IV Team collect   12/01/20 1056   12/02/20 0500  Prealbumin  (TPN Lab Panel)  Every Monday (0500),   R     Question:  Specimen collection method  Answer:  IV Team=IV Team collect   12/01/20 1056   12/01/20 0500  CBC  Daily,   R     Question:  Specimen collection method  Answer:  IV Team=IV Team collect   11/30/20 0916          Culture/Microbiology    Component Value Date/Time   SDES BIOPSY LIVER 11/20/2020 1622   SPECREQUEST Normal 11/20/2020 1622   CULT  11/20/2020 1622    No growth aerobically or anaerobically. Performed at [name], [place] [address] [place],  [address]    REPTSTATUS 11/25/2020 FINAL 11/20/2020 1622    Other culture-see note  Medications: Scheduled Meds: . acetaminophen  1,000 mg Oral Q6H  . Chlorhexidine Gluconate Cloth  6 each Topical Daily  . heparin injection (subcutaneous)  5,000 Units Subcutaneous Q8H  . influenza vac split quadrivalent PF  0.5 mL Intramuscular Tomorrow-1000  . insulin aspart  0-15  Units Subcutaneous Q4H  . lidocaine  1 patch Transdermal Daily  . lip balm  1 application Topical BID  . nicotine  21 mg Transdermal Daily  . pantoprazole (PROTONIX) IV  40 mg Intravenous Q12H  . sodium chloride flush  5 mL Intracatheter Q8H   Continuous Infusions: . methocarbamol (ROBAXIN) IV Stopped (12/05/20 6712)  . ondansetron [facility]) IV 8 mg (12/01/20 0152)  . TPN ADULT (ION) 100 mL/hr at 12/05/20 0634  . TPN ADULT (ION)      Antimicrobials: Anti-infectives (From admission, onward)   Start     Dose/Rate Route Frequency Ordered Stop   11/30/20 1045  ceFAZolin (ANCEF) IVPB 2g/100 mL premix  Status:  Discontinued  2 g 200 mL/hr over 30 Minutes Intravenous On call to O.R. 11/30/20 0957 11/30/20 1043   11/16/20 1000  remdesivir 100 mg in sodium chloride 0.9 % 100 mL IVPB       "Followed by" Linked Group Details   100 mg 200 mL/hr over 30 Minutes Intravenous Daily 11/15/20 2135 11/17/20 1002   11/15/20 2230  piperacillin-tazobactam (ZOSYN) IVPB 3.375 g        3.375 g 12.5 mL/hr over 240 Minutes Intravenous Every 8 hours 11/15/20 2144 12/03/20 2359   11/15/20 2134  remdesivir 200 mg in sodium chloride 0.9% 250 mL IVPB       "Followed by" Linked Group Details   200 mg 580 mL/hr over 30 Minutes Intravenous Once 11/15/20 2135 11/16/20 0029   11/15/20 1700  piperacillin-tazobactam (ZOSYN) IVPB 3.375 g        3.375 g 100 mL/hr over 30 Minutes Intravenous  Once 11/15/20 1657 11/15/20 1826     Objective: Vitals: Today's Vitals   12/05/20 0429 12/05/20 0621 12/05/20 0820 12/05/20 1237  BP:    125/87  Pulse:    81  Resp:    16  Temp:    98.1 F (36.7 C)  TempSrc:    Oral  SpO2:    99%  Weight:      Height:      PainSc: Asleep 6  0-No pain     Intake/Output Summary (Last 24 hours) at 12/05/2020 1317 Last data filed at 12/05/2020 1104 Gross per 24 hour  Intake 710 ml  Output 4050 ml  Net -3340 ml   Filed Weights   11/15/20 2000  Weight: 69.4 kg   Weight change:    Intake/Output from previous day: 02/23 0701 - 02/24 0700 In: 710 [P.O.:60; I.V.:650] Out: 3760 [Urine:1250; Emesis/NG output:1550; Drains:160; Stool:800] Intake/Output this shift: Total I/O In: 0  Out: 800 [Urine:300; Stool:500] Filed Weights   11/15/20 2000  Weight: 69.4 kg    Examination: General exam: AAOx3 , older than stated age, NAD, weak appearing. HEENT:Oral mucosa moist, Ear/Nose WNL grossly, dentition normal. Respiratory system: bilaterally clear,no wheezing or crackles,no use of accessory muscle Cardiovascular system: S1 & S2 +, No JVD,. Gastrointestinal system: Abdomen soft, surgical site clean dry intact ileostomy present, drain present, NT,ND, BS+ Nervous System:Alert, awake, moving extremities and grossly nonfocal Extremities: No edema, distal peripheral pulses palpable.  Skin: No rashes,no icterus. MSK: Normal muscle bulk,tone, power.  Data Reviewed: I have personally reviewed following labs and imaging studies CBC: Recent Labs  Lab 11/29/20 0352 11/30/20 0315 12/01/20 0345 12/02/20 0343 12/03/20 0445  WBC 13.6* 17.5* 15.2* 8.8 10.8*  NEUTROABS  --  14.5* 12.4* 5.8  --   HGB 9.6* 9.9* 9.4* 8.6* 8.9*  HCT 31.1* 33.2* 31.2* 28.8* 29.7*  MCV 76.0* 77.0* 77.8* 78.7* 79.6*  PLT 840* 860* 896* 792* 267*   Basic Metabolic Panel: Recent Labs  Lab 12/01/20 0345 12/01/20 0939 12/02/20 0343 12/03/20 0445 12/04/20 0400 12/05/20 0406  NA 132*  --  135 135 134* 134*  K 3.6  --  3.5 3.7 4.1 4.2  CL 91*  --  94* 97* 101 104  CO2 30  --  30 25 25  21*  GLUCOSE 144*  --  133* 153* 144* 131*  BUN 20  --  24* 23* 19 25*  CREATININE 0.90  --  0.91 0.67 0.50* 0.54*  CALCIUM 9.1  --  9.1 8.9 8.5* 8.8*  MG  --  2.2 2.2 1.8 2.1 2.0  PHOS  --  4.5 4.2 3.4 3.4 3.6   GFR: Estimated Creatinine Clearance: 107.2 mL/min (A) (by C-G formula based on SCr of 0.54 mg/dL (L)). Liver Function Tests: Recent Labs  Lab 12/02/20 0343 12/05/20 0406  AST 14* 25  ALT 15 42   ALKPHOS 120 149*  BILITOT 1.0 0.8  PROT 6.1* 6.1*  ALBUMIN 2.3* 2.3*   No results for input(s): LIPASE, AMYLASE in the last 168 hours. No results for input(s): AMMONIA in the last 168 hours. Coagulation Profile: No results for input(s): INR, PROTIME in the last 168 hours. Cardiac Enzymes: No results for input(s): CKTOTAL, CKMB, CKMBINDEX, TROPONINI in the last 168 hours. BNP (last 3 results) No results for input(s): PROBNP in the last 8760 hours. HbA1C: No results for input(s): HGBA1C in the last 72 hours. CBG: Recent Labs  Lab 12/04/20 2006 12/04/20 2345 12/05/20 0357 12/05/20 0749 12/05/20 1234  GLUCAP 121* 131* 132* 132* 131*   Lipid Profile: No results for input(s): CHOL, HDL, LDLCALC, TRIG, CHOLHDL, LDLDIRECT in the last 72 hours. Thyroid Function Tests: No results for input(s): TSH, T4TOTAL, FREET4, T3FREE, THYROIDAB in the last 72 hours. Anemia Panel: No results for input(s): VITAMINB12, FOLATE, FERRITIN, TIBC, IRON, RETICCTPCT in the last 72 hours. Sepsis Labs: Recent Labs  Lab 12/01/20 0321  LATICACIDVEN 1.1    Recent Results (from the past 240 hour(s))  Surgical pcr screen     Status: None   Collection Time: 11/27/20 12:36 AM   Specimen: Nasal Mucosa; Nasal Swab  Result Value Ref Range Status   MRSA, PCR NEGATIVE NEGATIVE Final   Staphylococcus aureus NEGATIVE NEGATIVE Final    Comment: (NOTE) The Xpert SA Assay (FDA approved for NASAL specimens in patients 64 years of age and older), is one component of a comprehensive surveillance program. It is not intended to diagnose infection nor to guide or monitor treatment. Performed at [facility], [place] [address], [place],  [address]      Radiology Studies: No results found.   LOS: 20 days   Antonieta Pert, MD [facility]  12/05/2020, 1:17 PM

## 2020-12-05 NOTE — Progress Notes (Signed)
PHARMACY - TOTAL PARENTERAL NUTRITION CONSULT NOTE   Indication: Prolonged ileus  Patient Measurements: Height: 6' (182.9 cm) Weight: 69.4 kg (153 lb) IBW/kg (Calculated) : 77.6 TPN AdjBW (KG): 69.4 Body mass index is 20.75 kg/m. Usual Weight: 170 lbs  Assessment: Eric [name] is a 50 y.o. male no known medical history. Patient presented secondary to right groin pain and found to have a retroperitoneal abscess/bowel perforation/cecal mass concerning for neoplasm. Incidental finding of COVID-19 positive status. Surgery following, no surgical interventions at this time. Plans for short term management with drains and IV abx. Plan for surgical resection of R colon/retroperitoneal mass ideally in 6 weeks for more optimal surgical conditions.   Patient is s/p colon resection on 2/17. On 2/19 he vomited 500 ml green emesis and vomited again 2/20. Noted to have worsening ileus. NGT placed. Pharmacy consulted to resume TPN for prolonged ileus.   Glucose / Insulin: No history of diabetes. A1c 5.2. CBGs 120-150s. Used 16 units SSI/24h.  Electrolytes: K 4.2 (goal >=4), Mg 2, Phos 3.6. Corrected Ca 10, HCO3 low, Cl up to wnl at 104.  Renal: SCr stable at 0.54, BUN up 24  LFTs / TGs: LFTs and Tbili WNL, alk phos 149, TG 128.  Prealbumin / albumin: Prealbumin 31.5 (2/14) >>21 (2/21). Albumin 2.3.  Intake / Output; MIVF: UOP 0.8 ml/kg/hr. Emesis when NGT clamped 2/22, re-attempting clamping trial today (2/24), NGT output/24h: 1550 cc,  R JP drain 160 ml/24 hrs. Ileostomy output 800 ml.  GI Imaging:  2/4 CT abd: long segment masslike thickening on cecum concerning for neoplasm, R retroperitoneal abscess 2/9 MRI abd: lesion in peripheral R liver - possible intrahepatic abscess vs. Metastatic disease. Dec fluid in R iliopsoas.  Surgeries / Procedures:  2/7: R retroperitoneal drain placed, replaced in OR 2/17 217: ex-lap, right colectomy, end ileostomy, cystoscopy with stent placement  Central access:  Triple lumen PICC 11/18/20 TPN start date: 11/18/20-11/24/20; resumed 2/20  Nutritional Goals (per RD recommendation on 2/16): Kcal:  2200-2430; Protein:  110-125; Fluid:  >/= 2.2 L Goal TPN rate is 100 mL/hr (provides 120 g of protein and an average of 2328 kcals per day - with 21 g/L lipids on MWF)  Current Nutrition:  NPO TPN  Plan:  Continue TPN at goal rate of 100 mL/hr Lipids MWF due to national shortage (previously held from 2/7 d/t shortage) Electrolytes in TPN: incr Na to 110 mEq/L, cont K at 62mEq/L, reduce Ca to 97mEq/L, cont Mg 57mEq/L, and cont Phos 66mmol/L, adjust Cl:Ac 1:2 Add standard MVI and trace elements to TPN Continue moderate q4h SSI and adjust as needed F/u TPN labs, BMET in the AM  Thank you for allowing pharmacy to be a part of this patient's care.  Alycia Rossetti, PharmD, BCPS Clinical Pharmacist Clinical phone for 12/05/2020: [phone] 12/05/2020 7:15 AM   **Pharmacist phone directory can now be found on [email or web address] ([email or web address]).  Listed under [place].

## 2020-12-05 NOTE — Progress Notes (Signed)
7 Days Post-Op  Subjective: Reports feeling much better today. Reports large volume liquid stool from his ostomy this AM (800 cc documented in epic). Mobilized yesterday. Took less IV dilaudid.   ROS: See above, otherwise other systems negative  Objective: Vital signs in last 24 hours: Temp:  [97.6 F (36.4 C)-98.4 F (36.9 C)] 98 F (36.7 C) (02/24 0400) Pulse Rate:  [85-90] 87 (02/24 0400) Resp:  [17-18] 17 (02/24 0400) BP: (123-132)/(91-93) 132/93 (02/24 0400) SpO2:  [97 %-98 %] 97 % (02/24 0400) Last BM Date: 12/04/20  Intake/Output from previous day: 02/23 0701 - 02/24 0700 In: 710 [P.O.:60; I.V.:650] Out: 3760 [Urine:1250; Emesis/NG output:1550; Drains:160; Stool:800] Intake/Output this shift: No intake/output data recorded.   PE: Gen: alert, cooperative NAD Abd: soft, appropriately tender, +BS, ileostomy in R hemiabdomen - edematous and viable, 300 cc dark, bilious contents in pouch, Midline wound dressing c/d/i  NG - 1,500 cc/24h (1,000 first shift, 500 second shift)  JP - 160cc SS   Lab Results:  Recent Labs    12/03/20 0445  WBC 10.8*  HGB 8.9*  HCT 29.7*  PLT 835*   BMET Recent Labs    12/04/20 0400 12/05/20 0406  NA 134* 134*  K 4.1 4.2  CL 101 104  CO2 25 21*  GLUCOSE 144* 131*  BUN 19 25*  CREATININE 0.50* 0.54*  CALCIUM 8.5* 8.8*   PT/INR No results for input(s): LABPROT, INR in the last 72 hours. CMP     Component Value Date/Time   NA 134 (L) 12/05/2020 0406   K 4.2 12/05/2020 0406   CL 104 12/05/2020 0406   CO2 21 (L) 12/05/2020 0406   GLUCOSE 131 (H) 12/05/2020 0406   BUN 25 (H) 12/05/2020 0406   CREATININE 0.54 (L) 12/05/2020 0406   CALCIUM 8.8 (L) 12/05/2020 0406   PROT 6.1 (L) 12/05/2020 0406   ALBUMIN 2.3 (L) 12/05/2020 0406   AST 25 12/05/2020 0406   ALT 42 12/05/2020 0406   ALKPHOS 149 (H) 12/05/2020 0406   BILITOT 0.8 12/05/2020 0406   GFRNONAA >60 12/05/2020 0406   Lipase  No results found for:  LIPASE     Studies/Results: No results found.  Anti-infectives: Anti-infectives (From admission, onward)   Start     Dose/Rate Route Frequency Ordered Stop   11/30/20 1045  ceFAZolin (ANCEF) IVPB 2g/100 mL premix  Status:  Discontinued        2 g 200 mL/hr over 30 Minutes Intravenous On call to O.R. 11/30/20 0957 11/30/20 1043   11/16/20 1000  remdesivir 100 mg in sodium chloride 0.9 % 100 mL IVPB       "Followed by" Linked Group Details   100 mg 200 mL/hr over 30 Minutes Intravenous Daily 11/15/20 2135 11/17/20 1002   11/15/20 2230  piperacillin-tazobactam (ZOSYN) IVPB 3.375 g        3.375 g 12.5 mL/hr over 240 Minutes Intravenous Every 8 hours 11/15/20 2144 12/03/20 2359   11/15/20 2134  remdesivir 200 mg in sodium chloride 0.9% 250 mL IVPB       "Followed by" Linked Group Details   200 mg 580 mL/hr over 30 Minutes Intravenous Once 11/15/20 2135 11/16/20 0029   11/15/20 1700  piperacillin-tazobactam (ZOSYN) IVPB 3.375 g        3.375 g 100 mL/hr over 30 Minutes Intravenous  Once 11/15/20 1657 11/15/20 1826       Assessment/Plan Anemia Hyponatremia Tobacco abuse COVID-19positive  Severe malnutrition - prealbumin 5.8 (2/8), continue  TPN  Colonmass with perforationand RTP abscess Stage IV adenocarcinomacolon  S/P EXPLORATORY LAPAROTOMY, RIGHT COLECTOMY, END ILEOSTOMY CREATION, CYSTOSCOPY W/ STENT PLACEMENT 11/28/20 (Dr. Rosendo Gros; Dr. Abner Greenspan) - POD#7 - AFVSS, WBC 10.8 on 2/22 - s/p IR drain placement 2/7 - this drain was removed intra-operatively and new blake drain placed; IR cx Enterobacter aerogenes - post-op ileus >> NG replaced 2/20, bowel function now returning, repeat NG clamp trial today followed by possible NG removal this afternoon and clear liquid diet. - IV Zosyn completed 2/22 at [facility] - PRN analgesics and antiemetics  - OOB/IS  - will likely need port-a-cath prior to discharge, will discuss timing with MD  Liver metastasis  - x3 noted on MRI, unsure if  abscess vs metastatic disease.IR liver biopsy 11/20/2020; -A. LIVER, RIGHT MASS, NEEDLE CORE BIOPSY:  - Adenocarcinoma.  COMMENT:  The morphology suggests metastatic colorectal adenocarcinoma.  Immunohistochemistry will be performed and reported as an addendum.  There is sufficient tissue for additional testing.   ID -zosyn 2/4-2/22 FEN - NPO, TPN, NG clamp trial - check residuals at 1300 VTE:  SQ heparin Foley -D/C-ed 2/18 Dispo - post-op ileus, TPN, await bowel function    LOS: 20 days    Jill Alexanders , [facility] 12/05/2020, 8:22 AM Please see Amion for pager number during day hours 7:00am-4:30pm or 7:00am -11:30am on weekends

## 2020-12-05 NOTE — Consult Note (Signed)
[place] Nurse ostomy follow up Patient receiving care in [facility] 6N17 Stoma type/location: RLQ Ileostomy Stomal assessment/size: 1 3/4" red moist budded. Peristomal assessment: Skin around the stoma is intact with minor bleeding around the stoma edges Output: Thin black green liquid 150 mls  Ostomy pouching: 2pc.  Education provided: Sister Cecille Rubin) was present for teaching today. She was able to do complete removal of pouch and placing new pouch on, including measuring the stoma, cleaning around the stoma, stretching the barrier ring to size and placing around the stoma, cutting the skin barrier to size, placing and pouch in place, opening and closing of the pouch and emptying the pouch. Explained to her and the patient what to do if crusting needed to be done. Sister would like another session if patient does not discharge. Planned time: Tuesday 12/10/20 @ 1:00 pm. Enrolled patient in [facility] Discharge program: Yes  2 piece pouching system   Fecal pouch, [place] #[phone]               Skin Nils Pyle # 2      Kizzie Bane, [place] # [place]. Tamala Julian, MSN, RN, [place], Lysle Pearl, [facility] Wound Treatment Associate Pager [phone]

## 2020-12-06 LAB — BASIC METABOLIC PANEL
Anion gap: 10 (ref 5–15)
BUN: 21 mg/dL — ABNORMAL HIGH (ref 6–20)
CO2: 21 mmol/L — ABNORMAL LOW (ref 22–32)
Calcium: 8.7 mg/dL — ABNORMAL LOW (ref 8.9–10.3)
Chloride: 100 mmol/L (ref 98–111)
Creatinine, Ser: 0.5 mg/dL — ABNORMAL LOW (ref 0.61–1.24)
GFR, Estimated: >60 mL/min (ref >60)
Glucose, Bld: 121 mg/dL — ABNORMAL HIGH (ref 70–99)
Potassium: 4.3 mmol/L (ref 3.5–5.1)
Sodium: 131 mmol/L — ABNORMAL LOW (ref 135–145)

## 2020-12-06 LAB — GLUCOSE, CAPILLARY
Glucose-Capillary: 122 mg/dL — ABNORMAL HIGH (ref 70–99)
Glucose-Capillary: 125 mg/dL — ABNORMAL HIGH (ref 70–99)
Glucose-Capillary: 128 mg/dL — ABNORMAL HIGH (ref 70–99)
Glucose-Capillary: 119 mg/dL — ABNORMAL HIGH (ref 70–99)
Glucose-Capillary: 127 mg/dL — ABNORMAL HIGH (ref 70–99)

## 2020-12-06 MED ORDER — ENSURE ENLIVE PO LIQD
237.0000 mL | Freq: Three times a day (TID) | ORAL | Status: DC
  Administered 2020-12-06 – 2020-12-07 (×3): 237 mL via ORAL

## 2020-12-06 MED ORDER — TRAVASOL 10 % IV SOLN: INTRAVENOUS | Status: DC

## 2020-12-06 MED ORDER — PSYLLIUM 95 % PO PACK: 1.0000 | PACK | Freq: Two times a day (BID) | ORAL | Status: DC

## 2020-12-06 MED ORDER — PSYLLIUM 95 % PO PACK: 1.0000 | PACK | Freq: Every day | ORAL | Status: DC

## 2020-12-06 MED ORDER — LOPERAMIDE HCL 2 MG PO CAPS: 2.0000 mg | ORAL_CAPSULE | Freq: Three times a day (TID) | ORAL | Status: DC

## 2020-12-06 MED ORDER — FERROUS SULFATE 325 (65 FE) MG PO TABS
325.0000 mg | ORAL_TABLET | Freq: Two times a day (BID) | ORAL | Status: DC
  Administered 2020-12-06 – 2020-12-07 (×2): 325 mg via ORAL
  Filled 2020-12-06 (×2): qty 1

## 2020-12-06 MED ORDER — TRAVASOL 10 % IV SOLN
INTRAVENOUS | Status: AC
  Filled 2020-12-06: qty 600

## 2020-12-06 NOTE — TOC Progression Note (Addendum)
Transition of Care [facility]) - Progression Note    Patient Details  Name: TENNIS MCKINNON MRN: [id number] Date of Birth: May 19, 1971  [facility] CM/SW Contact  Wile, Edson Snowball, RN Phone Number: 12/06/2020, 12:24 PM  Clinical Narrative:     Marye Round with [facility] declined referral.   [place] nurse Enrolled patient in [facility] Discharge program.  NCM ordered wound care supplies through [facility].  NCM calling other home health agencies agencies   [place] with [facility] unable to accept.   Asked Ramond Marrow with [facility] awaiting call back.  Carolyn with [facility] unable to accept.  Cheryl with [facility] unable to accept.   Amy with [facility] unable to accept.   Waiting to hear back from [facility] . [facility] unable to accept.   [facility] unable to accept.   [facility] unable to accept due to staffing.  Patient aware NCM unable to arrange [facility]   Expected Discharge Plan: [place] Barriers to Discharge: Continued Medical Work up  Expected Discharge Plan and Services Expected Discharge Plan: [place]   Discharge Planning Services: CM Consult Post Acute Care Choice: [place] arrangements for the past 2 months: Single Family Home                           HH Arranged: RN           Social Determinants of Health (SDOH) Interventions    Readmission Risk Interventions No flowsheet data found.

## 2020-12-06 NOTE — Progress Notes (Signed)
Nutrition Follow-up  DOCUMENTATION CODES:   Non-severe (moderate) malnutrition in context of chronic illness  INTERVENTION:   -D/c Boost Breeze po BID, each supplement provides 250 kcal and 9 grams of protein -Ensure Enlive po TID, each supplement provides 350 kcal and 20 grams of protein -TPN management per pharmacy  NUTRITION DIAGNOSIS:   Moderate Malnutrition related to  (illness) as evidenced by severe muscle depletion,moderate fat depletion,moderate muscle depletion.  [place]  GOAL:   Patient will meet greater than or equal to 90% of their needs  Progressing   MONITOR:   PO intake,Supplement acceptance,Skin,Weight trends,Labs,I & O's (TPN tolerance)  REASON FOR ASSESSMENT:   Malnutrition Screening Tool    ASSESSMENT:   50 year old male with history significant for tobacco abuse admitted with sepsis due to right retroperitoneal abscess and found to be positive for COVID-19. Pt who has not had any routine medical care for the last 10 years presents with 5 day onset of significant right groin pain that has been progressive and constant associated with chills and night sweats.  2/7 TPN initiated, s/p retroperitoneal abscess drain placement 2/9 Liver lesion biopsy,significant for colorectal adenocarcinoma 2/12- advanced to soft diet  2/13- TPN d/c, downgraded to full liquid diet 2/17- s/pProcedure(s) with comments: EXPLORATORY LAPAROTOMY (N/A) - DOW CASE TO START AT 730AM 4 HOUR CASE RIGHT COLECTOMY (Right) ILEOSTOMY CREATION (N/A) CYSTOSCOPY WITH STENT PLACEMENT (N/A) 2/20- NGT inserted, TPN initiated 2/24- NGT d/c, advanced to clear liquid diet 2/25- advanced to soft diet, TPN wean   Reviewed I/O's: -1.5 L x 24 hours and -17.6 L since 11/22/20  UOP: 600 ml x 24 hours  Drain output: 150 ml x 24 hours  Ileostomy output: 2 L x 24 hours  Pt unavailable at time of visit.   Pt tolerating clear liquid diet well. Noted meal completion 100%. Pt was advanced to  soft diet this morning.   Pt currently receiving TPN at 100 ml/hr, which provides 2112 kcals and 120 grams protein, meeting 96% of estimated kcal needs and 100% of estimated protein needs. Per pharmacy notes, plan to wean TPN- pt to receive TPN at 50 ml/hr at 1800, which will provides 1066 kcals and 60 grams protein, meeting 48% of estimated kcal needs and 55% of estimated protein needs.  Labs reviewed: Na: 131, CBGS: 122-128 (inpatient orders for glycemic control are 01-5 units insulin aspart every 4 hours).   Diet Order:   Diet Order            DIET SOFT Room service appropriate? Yes; Fluid consistency: Thin  Diet effective now                 EDUCATION NEEDS:   Not appropriate for education at this time  Skin:  Skin Assessment: Skin Integrity Issues: Skin Integrity Issues:: Incisions Incisions: rt upper adomen, abdomen, and perineum  Last BM:  12/06/20 (350 ml via ileostomy)  Height:   Ht Readings from Last 1 Encounters:  11/15/20 6' (1.829 m)    Weight:   Wt Readings from Last 1 Encounters:  11/15/20 69.4 kg   BMI:  Body mass index is 20.75 kg/m.  Estimated Nutritional Needs:   Kcal:  2200-2430  Protein:  110-125 grams  Fluid:  >/= 2.2 L    Loistine Chance, RD, LDN, [place] Registered Dietitian II Certified Diabetes Care and Education Specialist Please refer to [facility] for RD and/or RD on-call/weekend/after hours pager

## 2020-12-06 NOTE — Progress Notes (Signed)
PROGRESS NOTE    [name]  SWN:[id number] DOB: [birth year] DOA: 11/15/2020 PCP: [facility]   Chief Complaint  Patient presents with  . Groin Pain   Brief Narrative:50 year old male with no known PMH presented with right groin pain found to have retroperitoneal abscess with bowel perforation cecal mass,incidental Covid positive seen by surgery placed on TPN for severe protein calorie malnutrition and had IR drain the abscess.Patient underwent right hemicolectomy with ileostomy 2/17. Post-op course complicated by ileus, NGT was placed 2/20.  Patient is being managed with IV antibiotics leukocytosis much improved, remains on TPN with plan to wean off TPN as ileus improves. 2/22-attempted NG clamping but he vomited.  Antibiotic Zosyn completed 2/22. 2/25-diet started  Subjective: On soft diet Emesis/NG output:400; Drains:150; Stool:2000 No new complaints  Assessment & Plan:  Perforated colon cancer with RTP abscess s/p right colectomy/ileostomy 11/28/20, s/p ureteral stent Stage IV AC Colon/liver mets-biopsy suggests metastatic colorectal ACC Postop ileus CCS following appreciate input.  NG is out.  Now on soft diet.  Having high ileostomy output starting fiber. Continue PT OT pain control minimize narcotics. Continue PT OT pain management minimize narcotic.  Being followed by oncology-will need port placement prior to discharge.  Sepsis POA 2/2 perforated colon mass:Sepsis resolved.Completed abx Zosyn 2/4-2/22. Wbc Stable  Blood loss anemia/microcytic anemia with hemoglobin 5.4 g on presentation in the setting of anemia with chronic GI bleed from colon cancer. S/P PRBC x4 units.H&H stable. Recent Labs  Lab 11/30/20 0315 12/01/20 0345 12/02/20 0343 12/03/20 0445  HGB 9.9* 9.4* 8.6* 8.9*  HCT 33.2* 31.2* 28.8* 29.7*   Deconditioning/debility/fatigue: Encourage ambulation.  L  Moderate protein calorie starting soft diet.  Augment nutrition.  Thrombocytosis  likely reactive.  Monitor intermittently. Hyponatremia 120S on admission in the setting of #1.  Improved. Hyperglycemic-likely reactive A1c normal. COVID-19 virus infection positive incidental/asymptomatic s/px3 days remdesivir,off isolation 2/15. Tobacco abuse: Counseled to quit  Nutrition: Diet Order            DIET SOFT Room service appropriate? Yes; Fluid consistency: Thin  Diet effective now                 Nutrition Problem: Moderate Malnutrition Etiology:  (illness) Signs/Symptoms: severe muscle depletion,moderate fat depletion,moderate muscle depletion Interventions: Ensure Enlive (each supplement provides 350kcal and 20 grams of protein),TPN Pt's Body mass index is 20.75 kg/m.  DVT prophylaxis: heparin injection 5,000 Units Start: 11/29/20 0800 SCDs Start: 11/15/20 2135 Code Status:   Code Status: Full Code  Family Communication: Plan of care discussed with patient at bedside. He is self updating his sister and friend  Status is: Inpatient Remains inpatient appropriate because:Inpatient level of care appropriate due to severity of illness and ongoing post op care- ngt  drain and TPN  Dispo: The patient is from: Home              Anticipated d/c is to: Home.Reports he will be living with his sister while he is getting chemotherapy              Anticipated d/c date is:2 days once bowel function is stable now having high output ileostomy.  Will need Port-A-Cath prior to discharge likely early next week.                Patient currently is not medically stable to d/c.   Difficult to place patient No   Consultants:see note  Procedures:see note  Unresulted Labs (From admission, onward)  Start     Ordered   12/02/20 0500  Comprehensive metabolic panel  (TPN Lab Panel)  Every Mon,Thu (0500),   R     Question:  Specimen collection method  Answer:  IV Team=IV Team collect   12/01/20 1056   12/02/20 0500  Magnesium  (TPN Lab Panel)  Every Mon,Thu (0500),   R      Question:  Specimen collection method  Answer:  IV Team=IV Team collect   12/01/20 1056   12/02/20 0500  Phosphorus  (TPN Lab Panel)  Every Mon,Thu (0500),   R     Question:  Specimen collection method  Answer:  IV Team=IV Team collect   12/01/20 1056   12/02/20 0500  CBC  (TPN Lab Panel)  Every Monday (0500),   R     Question:  Specimen collection method  Answer:  IV Team=IV Team collect   12/01/20 1056   12/02/20 0500  Differential  (TPN Lab Panel)  Every Monday (0500),   R     Question:  Specimen collection method  Answer:  IV Team=IV Team collect   12/01/20 1056   12/02/20 0500  Triglycerides  (TPN Lab Panel)  Every Monday (0500),   R     Question:  Specimen collection method  Answer:  IV Team=IV Team collect   12/01/20 1056   12/02/20 0500  Prealbumin  (TPN Lab Panel)  Every Monday (0500),   R     Question:  Specimen collection method  Answer:  IV Team=IV Team collect   12/01/20 1056   12/01/20 0500  CBC  Daily,   R     Question:  Specimen collection method  Answer:  IV Team=IV Team collect   11/30/20 0916          Culture/Microbiology    Component Value Date/Time   SDES BIOPSY LIVER 11/20/2020 1622   SPECREQUEST Normal 11/20/2020 1622   CULT  11/20/2020 1622    No growth aerobically or anaerobically. Performed at [facility], [place] [address] [place], [place] [address]    REPTSTATUS 11/25/2020 FINAL 11/20/2020 1622    Other culture-see note  Medications: Scheduled Meds: . acetaminophen  1,000 mg Oral Q6H  . Chlorhexidine Gluconate Cloth  6 each Topical Daily  . feeding supplement  237 mL Oral TID BM  . ferrous sulfate  325 mg Oral BID WC  . heparin injection (subcutaneous)  5,000 Units Subcutaneous Q8H  . influenza vac split quadrivalent PF  0.5 mL Intramuscular Tomorrow-1000  . insulin aspart  0-15 Units Subcutaneous Q4H  . lidocaine  1 patch Transdermal Daily  . lip balm  1 application Topical BID  . nicotine  21 mg Transdermal Daily  . pantoprazole  (PROTONIX) IV  40 mg Intravenous Q12H  . psyllium  1 packet Oral BID  . sodium chloride flush  5 mL Intracatheter Q8H   Continuous Infusions: . methocarbamol (ROBAXIN) IV 1,000 mg (12/06/20 0516)  . ondansetron [facility]) IV 8 mg (12/01/20 0152)  . TPN ADULT (ION) 100 mL/hr at 12/05/20 1745  . TPN ADULT (ION)      Antimicrobials: Anti-infectives (From admission, onward)   Start     Dose/Rate Route Frequency Ordered Stop   11/30/20 1045  ceFAZolin (ANCEF) IVPB 2g/100 mL premix  Status:  Discontinued        2 g 200 mL/hr over 30 Minutes Intravenous On call to O.R. 11/30/20 0957 11/30/20 1043   11/16/20 1000  remdesivir 100 mg in  sodium chloride 0.9 % 100 mL IVPB       "Followed by" Linked Group Details   100 mg 200 mL/hr over 30 Minutes Intravenous Daily 11/15/20 2135 11/17/20 1002   11/15/20 2230  piperacillin-tazobactam (ZOSYN) IVPB 3.375 g        3.375 g 12.5 mL/hr over 240 Minutes Intravenous Every 8 hours 11/15/20 2144 12/03/20 2359   11/15/20 2134  remdesivir 200 mg in sodium chloride 0.9% 250 mL IVPB       "Followed by" Linked Group Details   200 mg 580 mL/hr over 30 Minutes Intravenous Once 11/15/20 2135 11/16/20 0029   11/15/20 1700  piperacillin-tazobactam (ZOSYN) IVPB 3.375 g        3.375 g 100 mL/hr over 30 Minutes Intravenous  Once 11/15/20 1657 11/15/20 1826     Objective: Vitals: Today's Vitals   12/06/20 0333 12/06/20 0402 12/06/20 0406 12/06/20 0928  BP:   125/79   Pulse:   69   Resp:   17   Temp:   97.7 F (36.5 C)   TempSrc:   Oral   SpO2:   98%   Weight:      Height:      PainSc: 8  Asleep  3     Intake/Output Summary (Last 24 hours) at 12/06/2020 1257 Last data filed at 12/06/2020 0900 Gross per 24 hour  Intake 1857.44 ml  Output 2350 ml  Net -492.56 ml   Filed Weights   11/15/20 2000  Weight: 69.4 kg   Weight change:   Intake/Output from previous day: 02/24 0701 - 02/25 0700 In: 1617.4 [P.O.:490; I.V.:927.4; IV Piggyback:200] Out: 3150  [Urine:600; Emesis/NG output:400; Drains:150; EGBTD:1761] Intake/Output this shift: Total I/O In: 240 [P.O.:240] Out: -  Filed Weights   11/15/20 2000  Weight: 69.4 kg    Examination:  General exam: AAOx3, old for his age, on room air.  Not in acute distress. HEENT:Oral mucosa moist, Ear/Nose WNL grossly, dentition normal. Respiratory system: bilaterally clear,no wheezing or crackles,no use of accessory muscle Cardiovascular system: S1 & S2 +, No JVD,. Gastrointestinal system: Abdomen soft, NT,ND, BS+.  Drain in place, surgical site clean dry intact.  Ostomy with stool ++. Nervous System:Alert, awake, moving extremities and grossly nonfocal Extremities: No edema, distal peripheral pulses palpable.  Skin: No rashes,no icterus. MSK: Normal muscle bulk,tone, power  Data Reviewed: I have personally reviewed following labs and imaging studies CBC: Recent Labs  Lab 11/30/20 0315 12/01/20 0345 12/02/20 0343 12/03/20 0445  WBC 17.5* 15.2* 8.8 10.8*  NEUTROABS 14.5* 12.4* 5.8  --   HGB 9.9* 9.4* 8.6* 8.9*  HCT 33.2* 31.2* 28.8* 29.7*  MCV 77.0* 77.8* 78.7* 79.6*  PLT 860* 896* 792* 607*   Basic Metabolic Panel: Recent Labs  Lab 12/01/20 0939 12/02/20 0343 12/03/20 0445 12/04/20 0400 12/05/20 0406 12/06/20 0353  NA  --  135 135 134* 134* 131*  K  --  3.5 3.7 4.1 4.2 4.3  CL  --  94* 97* 101 104 100  CO2  --  30 25 25  21* 21*  GLUCOSE  --  133* 153* 144* 131* 121*  BUN  --  24* 23* 19 25* 21*  CREATININE  --  0.91 0.67 0.50* 0.54* 0.50*  CALCIUM  --  9.1 8.9 8.5* 8.8* 8.7*  MG 2.2 2.2 1.8 2.1 2.0  --   PHOS 4.5 4.2 3.4 3.4 3.6  --    GFR: Estimated Creatinine Clearance: 107.2 mL/min (A) (by C-G formula based on SCr  of 0.5 mg/dL (L)). Liver Function Tests: Recent Labs  Lab 12/02/20 0343 12/05/20 0406  AST 14* 25  ALT 15 42  ALKPHOS 120 149*  BILITOT 1.0 0.8  PROT 6.1* 6.1*  ALBUMIN 2.3* 2.3*   No results for input(s): LIPASE, AMYLASE in the last 168  hours. No results for input(s): AMMONIA in the last 168 hours. Coagulation Profile: No results for input(s): INR, PROTIME in the last 168 hours. Cardiac Enzymes: No results for input(s): CKTOTAL, CKMB, CKMBINDEX, TROPONINI in the last 168 hours. BNP (last 3 results) No results for input(s): PROBNP in the last 8760 hours. HbA1C: No results for input(s): HGBA1C in the last 72 hours. CBG: Recent Labs  Lab 12/05/20 2014 12/05/20 2328 12/06/20 0405 12/06/20 0756 12/06/20 1152  GLUCAP 121* 123* 122* 125* 128*   Lipid Profile: No results for input(s): CHOL, HDL, LDLCALC, TRIG, CHOLHDL, LDLDIRECT in the last 72 hours. Thyroid Function Tests: No results for input(s): TSH, T4TOTAL, FREET4, T3FREE, THYROIDAB in the last 72 hours. Anemia Panel: No results for input(s): VITAMINB12, FOLATE, FERRITIN, TIBC, IRON, RETICCTPCT in the last 72 hours. Sepsis Labs: Recent Labs  Lab 12/01/20 0321  LATICACIDVEN 1.1    Recent Results (from the past 240 hour(s))  Surgical pcr screen     Status: None   Collection Time: 11/27/20 12:36 AM   Specimen: Nasal Mucosa; Nasal Swab  Result Value Ref Range Status   MRSA, PCR NEGATIVE NEGATIVE Final   Staphylococcus aureus NEGATIVE NEGATIVE Final    Comment: (NOTE) The Xpert SA Assay (FDA approved for NASAL specimens in patients 66 years of age and older), is one component of a comprehensive surveillance program. It is not intended to diagnose infection nor to guide or monitor treatment. Performed at [facility], [place] [address] [place], [place] [address]      Radiology Studies: No results found.   LOS: 21 days   Antonieta Pert, MD [facility]  12/06/2020, 12:57 PM

## 2020-12-06 NOTE — Progress Notes (Signed)
8 Days Post-Op  Subjective: Feeling better. Tolerating clears. 2L from ileostomy.  ROS: See above, otherwise other systems negative  Objective: Vital signs in last 24 hours: Temp:  [97.7 F (36.5 C)-98.2 F (36.8 C)] 97.7 F (36.5 C) (02/25 0406) Pulse Rate:  [68-81] 69 (02/25 0406) Resp:  [16-17] 17 (02/25 0406) BP: (125-128)/(79-87) 125/79 (02/25 0406) SpO2:  [98 %-99 %] 98 % (02/25 0406) Last BM Date: 12/05/20 (via ostomy)  Intake/Output from previous day: 02/24 0701 - 02/25 0700 In: 1617.4 [P.O.:490; I.V.:927.4; IV Piggyback:200] Out: 3150 [Urine:600; Emesis/NG output:400; Drains:150; Stool:2000] Intake/Output this shift: Total I/O In: 240 [P.O.:240] Out: -    PE: Gen: alert, cooperative NAD Abd: soft, appropriately tender, +BS, ileostomy in R hemiabdomen - edematous and viable - liquid stool efflux in pouch. Midline wound dressing stained with enteric contents.      ileostomy - 2,000 L    JP - SS   BMET Recent Labs    12/05/20 0406 12/06/20 0353  NA 134* 131*  K 4.2 4.3  CL 104 100  CO2 21* 21*  GLUCOSE 131* 121*  BUN 25* 21*  CREATININE 0.54* 0.50*  CALCIUM 8.8* 8.7*   CMP     Component Value Date/Time   NA 131 (L) 12/06/2020 0353   K 4.3 12/06/2020 0353   CL 100 12/06/2020 0353   CO2 21 (L) 12/06/2020 0353   GLUCOSE 121 (H) 12/06/2020 0353   BUN 21 (H) 12/06/2020 0353   CREATININE 0.50 (L) 12/06/2020 0353   CALCIUM 8.7 (L) 12/06/2020 0353   PROT 6.1 (L) 12/05/2020 0406   ALBUMIN 2.3 (L) 12/05/2020 0406   AST 25 12/05/2020 0406   ALT 42 12/05/2020 0406   ALKPHOS 149 (H) 12/05/2020 0406   BILITOT 0.8 12/05/2020 0406   GFRNONAA >60 12/06/2020 0353   Anti-infectives: Anti-infectives (From admission, onward)   Start     Dose/Rate Route Frequency Ordered Stop   11/30/20 1045  ceFAZolin (ANCEF) IVPB 2g/100 mL premix  Status:  Discontinued        2 g 200 mL/hr over 30 Minutes Intravenous On call to O.R. 11/30/20 0957 11/30/20 1043    11/16/20 1000  remdesivir 100 mg in sodium chloride 0.9 % 100 mL IVPB       "Followed by" Linked Group Details   100 mg 200 mL/hr over 30 Minutes Intravenous Daily 11/15/20 2135 11/17/20 1002   11/15/20 2230  piperacillin-tazobactam (ZOSYN) IVPB 3.375 g        3.375 g 12.5 mL/hr over 240 Minutes Intravenous Every 8 hours 11/15/20 2144 12/03/20 2359   11/15/20 2134  remdesivir 200 mg in sodium chloride 0.9% 250 mL IVPB       "Followed by" Linked Group Details   200 mg 580 mL/hr over 30 Minutes Intravenous Once 11/15/20 2135 11/16/20 0029   11/15/20 1700  piperacillin-tazobactam (ZOSYN) IVPB 3.375 g        3.375 g 100 mL/hr over 30 Minutes Intravenous  Once 11/15/20 1657 11/15/20 1826       Assessment/Plan Anemia Hyponatremia Tobacco abuse COVID-19positive  Severe malnutrition - prealbumin 5.8 (2/8), continue TPN  Colonmass with perforationand RTP abscess Stage IV adenocarcinomacolon  S/P EXPLORATORY LAPAROTOMY, RIGHT COLECTOMY, END ILEOSTOMY CREATION, CYSTOSCOPY W/ STENT PLACEMENT 11/28/20 (Dr. Rosendo Gros; Dr. Abner Greenspan) - POD#8 - AFVSS - s/p IR drain placement 2/7 - this drain was removed intra-operatively and new blake drain placed; IR cx Enterobacter aerogenes - post-op ileus resolving, closely monitor ileostomy output. Start fiber  and iron supplementation. May also need imodium and/or lomotil at some point output does not thicken up. Advance to SOFT diet. - IV Zosyn completed 2/22 at [facility]  - PRN analgesics and antiemetics  - OOB/IS  - will likely need port-a-cath prior to discharge, will discuss timing with MD - potentially Monday 2/28  Liver metastasis  - x3 noted on MRI, unsure if abscess vs metastatic disease.IR liver biopsy 11/20/2020; -A. LIVER, RIGHT MASS, NEEDLE CORE BIOPSY:  - Adenocarcinoma.  COMMENT:  The morphology suggests metastatic colorectal adenocarcinoma.  Immunohistochemistry will be performed and reported as an addendum.  There is sufficient tissue for  additional testing.   ID -zosyn 2/4-2/22 FEN - SOFT diet, ok to wean TPN VTE:  SQ heparin Foley -D/C-ed 2/18 Dispo - ileus resolving, advance diet, surgical planning for port placement    LOS: 21 days    Jill Alexanders , [facility] 12/06/2020, 10:01 AM Please see Amion for pager number during day hours 7:00am-4:30pm or 7:00am -11:30am on weekends

## 2020-12-06 NOTE — Progress Notes (Signed)
Physical Therapy Treatment Patient Details Name: Eric [name] MRN: [id number] DOB: Mar 14, 1971 Today's Date: 12/06/2020    History of Present Illness Pt is a 50 y.o. male admitted 11/15/20 with R groin pain; incidental (+) COVID-19. 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 S/p percutaneous drain placement 2/7. Liver lesion biopsy 2/9. PMH includes tobacco use. S/p exploratory laparotomy, colectomy end ostomy placement and cystoscopy with stent.    PT Comments    Patient progressing well towards PT goals. Reports pain is improved. Feels better having NGT out. Improved ambulation distance with use of RW for support. Reports he feels better holding onto RW at this time due to weakness however likely will be ready for gait training without it soon. Encouraged increasing activity/walking  2-3 times daily with family/nursing especially this weekend. Will plan for higher level balance challenges next session as tolerated. Will follow.   Follow Up Recommendations  No PT follow up     Equipment Recommendations  None recommended by PT    Recommendations for Other Services       Precautions / Restrictions Precautions Precautions: Fall;Other (comment) Precaution Comments: JP drain and Rt ostomy pouch Restrictions Weight Bearing Restrictions: No    Mobility  Bed Mobility Overal bed mobility: Needs Assistance Bed Mobility: Rolling;Sidelying to Sit;Sit to Sidelying Rolling: Modified independent (Device/Increase time) Sidelying to sit: Modified independent (Device/Increase time);HOB elevated     Sit to sidelying: Modified independent (Device/Increase time);HOB elevated General bed mobility comments: No assist needed, use of rail.    Transfers Overall transfer level: Needs assistance Equipment used: Rolling walker (2 wheeled) Transfers: Sit to/from Stand Sit to Stand: Supervision         General transfer comment:  supervision for safety, no LOB noted. Stood from [employer].  Ambulation/Gait Ambulation/Gait assistance: Supervision Gait Distance (Feet): 350 Feet Assistive device: Rolling walker (2 wheeled) Gait Pattern/deviations: Step-through pattern;Narrow base of support;Trunk flexed Gait velocity: Decreased Gait velocity interpretation: 1.31 - 2.62 ft/sec, indicative of limited community ambulator General Gait Details: Slow, steady gait with RW for support; no evidence of imbalance. VSS on RA.   Stairs             Wheelchair Mobility    Modified Rankin (Stroke Patients Only)       Balance Overall balance assessment: Needs assistance Sitting-balance support: Feet supported;No upper extremity supported Sitting balance-Leahy Scale: Good     Standing balance support: During functional activity Standing balance-Leahy Scale: Fair Standing balance comment: Prefers UE support esp for walking due to reported weakness                            Cognition Arousal/Alertness: Awake/alert Behavior During Therapy: WFL for tasks assessed/performed                                   General Comments: appears to be his baseline      Exercises      General Comments        Pertinent Vitals/Pain Pain Assessment: No/denies pain    Home Living                      Prior Function            PT Goals (current goals can now be found in the care plan section) Progress towards PT  goals: Progressing toward goals    Frequency    Min 3X/week      PT Plan Current plan remains appropriate    Co-evaluation              AM-PAC PT "6 Clicks" Mobility   Outcome Measure  Help needed turning from your back to your side while in a flat bed without using bedrails?: None Help needed moving from lying on your back to sitting on the side of a flat bed without using bedrails?: A Little Help needed moving to and from a bed to a chair (including a  wheelchair)?: None Help needed standing up from a chair using your arms (e.g., wheelchair or bedside chair)?: None Help needed to walk in hospital room?: A Little Help needed climbing 3-5 steps with a railing? : A Little 6 Click Score: 21    End of Session   Activity Tolerance: Patient tolerated treatment well Patient left: in bed;with call bell/phone within reach;with bed alarm set Nurse Communication: Mobility status PT Visit Diagnosis: Muscle weakness (generalized) (M62.81);Difficulty in walking, not elsewhere classified (R26.2)     Time: 0093-8182 PT Time Calculation (min) (ACUTE ONLY): 13 min  Charges:  $Therapeutic Exercise: 8-22 mins                     Marisa Severin, PT, DPT Acute Rehabilitation Services Pager [phone] Office [phone]       Marguarite Arbour A Sabra Heck 12/06/2020, 3:30 PM

## 2020-12-06 NOTE — Progress Notes (Signed)
PHARMACY - TOTAL PARENTERAL NUTRITION CONSULT NOTE   Indication: Prolonged ileus  Patient Measurements: Height: 6' (182.9 cm) Weight: 69.4 kg (153 lb) IBW/kg (Calculated) : 77.6 TPN AdjBW (KG): 69.4 Body mass index is 20.75 kg/m. Usual Weight: 170 lbs  Assessment: Eric [name] is a 50 y.o. male no known medical history. Patient presented secondary to right groin pain and found to have a retroperitoneal abscess/bowel perforation/cecal mass concerning for neoplasm. Incidental finding of COVID-19 positive status.  Patient is  s/p colon resection on 2/17. On 2/19 he vomited 500 ml green emesis and vomited again 2/20. Noted to have worsening ileus. NGT placed. Pharmacy consulted to resume TPN for prolonged ileus.   Glucose / Insulin: No history of diabetes. A1c 5.2. CBGs 120-130s. Used 10 units SSI/24h.  Electrolytes: K 4.3 (goal >=4), Mg 2, Phos 3.6. Corrected Ca 10.1, HCO3 low, Cl up to wnl. Na low at 131.  Renal: SCr stable at 0.5, BUN 21 LFTs / TGs: LFTs and Tbili WNL, alk phos 149, TG 128.  Prealbumin / albumin: Prealbumin 31.5 (2/14) >>21 (2/21). Albumin 2.3.  Intake / Output; MIVF: UOP 0.4 ml/kg/hr. Tolerated NGT clamping 2/24, NGT output/24h: 400 cc,  R JP drain 150 ml/24 hrs. Ileostomy output significant at 2000 ml - addin GI Imaging:  2/4 CT abd: long segment masslike thickening on cecum concerning for neoplasm, R retroperitoneal abscess 2/9 MRI abd: lesion in peripheral R liver - possible intrahepatic abscess vs. Metastatic disease. Dec fluid in R iliopsoas.  Surgeries / Procedures:  2/7: R retroperitoneal drain placed, replaced in OR 2/17 2/17: ex-lap, right colectomy, end ileostomy, cystoscopy with stent placement  Central access: Triple lumen PICC 11/18/20 TPN start date: 11/18/20-11/24/20; resumed 2/20  Nutritional Goals (per RD recommendation on 2/16): Kcal:  2200-2430; Protein:  110-125; Fluid:  >/= 2.2 L Goal TPN rate is 100 mL/hr (provides 120 g of protein and an average of  2328 kcals per day - with 21 g/L lipids on MWF)  Current Nutrition:  Clear liquids - 100% of two meals documented in last 24hrs Advancing to soft diet TPN  Plan:  Per surgery, advancing to soft diet today. Wean TPN at half rate of 50 mL/hr. Follow-up toleration of oral diet and ability to stop tomorrow.  Lipids MWF due to national shortage (previously held from 2/7 d/t shortage) Electrolytes in TPN: Continue Na 110 mEq/L, K  41mq/L, Ca 267m/L, cont Mg 17m52mL, and cont Phos 117m66mL, adjust Cl:Ac 1:2 Add standard MVI and trace elements to TPN Continue moderate q4h SSI and adjust as needed F/u TPN labs, BMET in the AM with reduction in TPN and possible need to replace Follow up toleration of advanced diet and ability to stop TPN  Thank you for allowing pharmacy to be a part of this patient's care.  JessSloan LeiterarmD, BCPS, BCCCP Clinical Pharmacist Clinical phone for 12/06/2020: [phone]/2022 7:14 AM   **Pharmacist phone directory can now be found on [email or web address] ([email or web address]).  Listed under [facility]

## 2020-12-07 ENCOUNTER — Inpatient Hospital Stay (HOSPITAL_COMMUNITY): Payer: Self-pay

## 2020-12-07 LAB — BASIC METABOLIC PANEL
Anion gap: 11 (ref 5–15)
BUN: 23 mg/dL — ABNORMAL HIGH (ref 6–20)
CO2: 22 mmol/L (ref 22–32)
Calcium: 9.4 mg/dL (ref 8.9–10.3)
Chloride: 97 mmol/L — ABNORMAL LOW (ref 98–111)
Creatinine, Ser: 0.65 mg/dL (ref 0.61–1.24)
GFR, Estimated: >60 mL/min (ref >60)
Glucose, Bld: 122 mg/dL — ABNORMAL HIGH (ref 70–99)
Potassium: 4.5 mmol/L (ref 3.5–5.1)
Sodium: 130 mmol/L — ABNORMAL LOW (ref 135–145)

## 2020-12-07 LAB — GLUCOSE, CAPILLARY
Glucose-Capillary: 110 mg/dL — ABNORMAL HIGH (ref 70–99)
Glucose-Capillary: 112 mg/dL — ABNORMAL HIGH (ref 70–99)
Glucose-Capillary: 114 mg/dL — ABNORMAL HIGH (ref 70–99)
Glucose-Capillary: 118 mg/dL — ABNORMAL HIGH (ref 70–99)
Glucose-Capillary: 129 mg/dL — ABNORMAL HIGH (ref 70–99)
Glucose-Capillary: 146 mg/dL — ABNORMAL HIGH (ref 70–99)

## 2020-12-07 MED ORDER — TRAVASOL 10 % IV SOLN
INTRAVENOUS | Status: AC
  Filled 2020-12-07: qty 1200

## 2020-12-07 NOTE — Progress Notes (Signed)
PROGRESS NOTE    Eric [name]  UQJ:[id number] DOB: 02-Aug-1971 DOA: 11/15/2020 PCP: [facility], [facility]   Chief Complaint  Patient presents with  . Groin Pain   Brief Narrative:50 year old male with no known PMH presented with right groin pain found to have retroperitoneal abscess with bowel perforation cecal mass,incidental Covid positive seen by surgery placed on TPN for severe protein calorie malnutrition and had IR drain the abscess.Patient underwent right hemicolectomy with ileostomy 2/17. Post-op course complicated by ileus, NGT was placed 2/20.  Patient is being managed with IV antibiotics leukocytosis much improved, remains on TPN with plan to wean off TPN as ileus improves. 2/22-attempted NG clamping but he vomited.  Antibiotic Zosyn completed 2/22. 2/25-diet started  Subjective: Seen and examined this morning. Patient vomited earlier and diet changed to clear liquid diet He feels better after vomiting No fever overnight Output: 1210 [Urine:750; Emesis/NG output:300; Drains:160  Assessment & Plan:  Perforated Stage IV colon cancer with RTP abscess s/p right colectomy/ileostomy 11/28/20, s/p ureteral stent, with liver mets  Postop ileus-resolved Started on soft diet 2/25-episode of vomiting and diet scaled down to clear liquid diet.Continue to monitor ileostomy output, continue diet as per CCS.  Increase activity PT OT, pain management.   Being followed by oncology-will need port placement prior to discharge.  Sepsis POA 2/2 perforated colon mass:Sepsis resolved.Completed abx Zosyn 2/4-2/22.  Afebrile and WBC count stable.  Blood loss anemia/microcytic anemia with hemoglobin 5.4 g on presentation in the setting of anemia with chronic GI bleed from colon cancer.S/P PRBC x4 units hemoglobin overall stable. Recent Labs  Lab 12/01/20 0345 12/02/20 0343 12/03/20 0445  HGB 9.4* 8.6* 8.9*  HCT 31.2* 28.8* 29.7*   Deconditioning/debility/fatigue: Continue PT OT.   Reports he has been ambulating.  Moderate protein calorie malnutrition augment diet   Thrombocytosis likely reactive.  Monitor intermittently. Hyponatremia 120S on admission in the setting of #1.  Improved. Hyperglycemic-likely reactive A1c is normal. COVID-19 virus infection positive incidental/asymptomatic s/px3 days remdesivir,off isolation 2/15. Tobacco abuse: Counseled to quit  Nutrition: Diet Order            Diet clear liquid Room service appropriate? Yes; Fluid consistency: Thin  Diet effective now                 Nutrition Problem: Moderate Malnutrition Etiology:  (illness) Signs/Symptoms: severe muscle depletion,moderate fat depletion,moderate muscle depletion Interventions: Ensure Enlive (each supplement provides 350kcal and 20 grams of protein),TPN Pt's Body mass index is 20.75 kg/m.  DVT prophylaxis: heparin injection 5,000 Units Start: 11/29/20 0800 SCDs Start: 11/15/20 2135 Code Status:   Code Status: Full Code  Family Communication: Plan of care discussed with patient at bedside. He is self updating his sister and friend  Status is: Inpatient Remains inpatient appropriate because:Inpatient level of care appropriate due to severity of illness and ongoing post op care- ngt  drain and TPN  Dispo: The patient is from: Home              Anticipated d/c is to: Home.Reports he will be living with his sister while he is getting chemotherapy              Anticipated d/c date is:2 days once bowel function is stable now having high output ileostomy.  Will need Port-A-Cath prior to discharge likely early next week.                Patient currently is not medically stable to d/c.   Difficult to  place patient No   Consultants:see note  Procedures:see note  Unresulted Labs (From admission, onward)          Start     Ordered   12/08/20 0500  CBC  Tomorrow morning,   R       Question:  Specimen collection method  Answer:  IV Team=IV Team collect   12/07/20 0844    12/08/20 1245  Basic metabolic panel  Tomorrow morning,   R       Question:  Specimen collection method  Answer:  IV Team=IV Team collect   12/07/20 0844   12/08/20 0500  Magnesium  Tomorrow morning,   R       Question:  Specimen collection method  Answer:  IV Team=IV Team collect   12/07/20 1016   12/07/20 8099  Basic metabolic panel  Once,   R       Question:  Specimen collection method  Answer:  IV Team=IV Team collect   12/07/20 8338   12/02/20 0500  Comprehensive metabolic panel  (TPN Lab Panel)  Every Mon,Thu (0500),   R     Question:  Specimen collection method  Answer:  IV Team=IV Team collect   12/01/20 1056   12/02/20 0500  Magnesium  (TPN Lab Panel)  Every Mon,Thu (0500),   R     Question:  Specimen collection method  Answer:  IV Team=IV Team collect   12/01/20 1056   12/02/20 0500  Phosphorus  (TPN Lab Panel)  Every Mon,Thu (0500),   R     Question:  Specimen collection method  Answer:  IV Team=IV Team collect   12/01/20 1056   12/02/20 0500  CBC  (TPN Lab Panel)  Every Monday (0500),   R     Question:  Specimen collection method  Answer:  IV Team=IV Team collect   12/01/20 1056   12/02/20 0500  Differential  (TPN Lab Panel)  Every Monday (0500),   R     Question:  Specimen collection method  Answer:  IV Team=IV Team collect   12/01/20 1056   12/02/20 0500  Triglycerides  (TPN Lab Panel)  Every Monday (0500),   R     Question:  Specimen collection method  Answer:  IV Team=IV Team collect   12/01/20 1056   12/02/20 0500  Prealbumin  (TPN Lab Panel)  Every Monday (0500),   R     Question:  Specimen collection method  Answer:  IV Team=IV Team collect   12/01/20 1056   12/01/20 0500  CBC  Daily,   R     Question:  Specimen collection method  Answer:  IV Team=IV Team collect   11/30/20 0916          Culture/Microbiology    Component Value Date/Time   SDES BIOPSY LIVER 11/20/2020 1622   SPECREQUEST Normal 11/20/2020 1622   CULT  11/20/2020 1622    No growth  aerobically or anaerobically. Performed at [facility], [place] [address], [place], [place] [address]    REPTSTATUS 11/25/2020 FINAL 11/20/2020 1622    Other culture-see note  Medications: Scheduled Meds: . acetaminophen  1,000 mg Oral Q6H  . Chlorhexidine Gluconate Cloth  6 each Topical Daily  . feeding supplement  237 mL Oral TID BM  . heparin injection (subcutaneous)  5,000 Units Subcutaneous Q8H  . influenza vac split quadrivalent PF  0.5 mL Intramuscular Tomorrow-1000  . insulin aspart  0-15 Units Subcutaneous Q4H  . lidocaine  1 patch Transdermal Daily  . lip balm  1 application Topical BID  . nicotine  21 mg Transdermal Daily  . pantoprazole (PROTONIX) IV  40 mg Intravenous Q12H  . sodium chloride flush  5 mL Intracatheter Q8H   Continuous Infusions: . methocarbamol (ROBAXIN) IV 1,000 mg (12/07/20 6384)  . ondansetron [facility]) IV 8 mg (12/01/20 0152)  . TPN ADULT (ION) 50 mL/hr at 12/06/20 1736  . TPN ADULT (ION)      Antimicrobials: Anti-infectives (From admission, onward)   Start     Dose/Rate Route Frequency Ordered Stop   11/30/20 1045  ceFAZolin (ANCEF) IVPB 2g/100 mL premix  Status:  Discontinued        2 g 200 mL/hr over 30 Minutes Intravenous On call to O.R. 11/30/20 0957 11/30/20 1043   11/16/20 1000  remdesivir 100 mg in sodium chloride 0.9 % 100 mL IVPB       "Followed by" Linked Group Details   100 mg 200 mL/hr over 30 Minutes Intravenous Daily 11/15/20 2135 11/17/20 1002   11/15/20 2230  piperacillin-tazobactam (ZOSYN) IVPB 3.375 g        3.375 g 12.5 mL/hr over 240 Minutes Intravenous Every 8 hours 11/15/20 2144 12/03/20 2359   11/15/20 2134  remdesivir 200 mg in sodium chloride 0.9% 250 mL IVPB       "Followed by" Linked Group Details   200 mg 580 mL/hr over 30 Minutes Intravenous Once 11/15/20 2135 11/16/20 0029   11/15/20 1700  piperacillin-tazobactam (ZOSYN) IVPB 3.375 g        3.375 g 100 mL/hr over 30 Minutes Intravenous  Once 11/15/20  1657 11/15/20 1826     Objective: Vitals: Today's Vitals   12/07/20 0411 12/07/20 0412 12/07/20 0458 12/07/20 0817  BP: 132/89   (!) 119/92  Pulse: 89   83  Resp: 16   18  Temp: 98.5 F (36.9 C)   98.4 F (36.9 C)  TempSrc: Oral   Oral  SpO2: 97%   98%  Weight:      Height:      PainSc:  8  Asleep     Intake/Output Summary (Last 24 hours) at 12/07/2020 1100 Last data filed at 12/07/2020 0412 Gross per 24 hour  Intake 1304.17 ml  Output 1210 ml  Net 94.17 ml   Filed Weights   11/15/20 2000  Weight: 69.4 kg   Weight change:   Intake/Output from previous day: 02/25 0701 - 02/26 0700 In: 1544.2 [P.O.:660; I.V.:484.2; IV Piggyback:400] Out: 1210 [Urine:750; Emesis/NG output:300; Drains:160] Intake/Output this shift: No intake/output data recorded. Filed Weights   11/15/20 2000  Weight: 69.4 kg    Examination: General exam:AAOx3,thin, old for his age, NAD, weak appearing. HEENT:Oral mucosa moist, Ear/Nose WNL grossly, dentition normal. Respiratory system: bilaterally clear,no wheezing or crackles,no use of accessory muscle Cardiovascular system: S1 & S2 +, No JVD. Gastrointestinal system: Abdomen soft, surgical site with staples clean with dressing intact with mild soiling- ileostomy present, JP drain present NT,ND, BS+. Nervous System:Alert, awake, moving extremities and grossly non-focal. Extremities: No edema, distal peripheral pulses palpable.  Skin:No rashes,no icterus. YKZ:LDJTTS muscle bulk,tone, power.  Data Reviewed: I have personally reviewed following labs and imaging studies CBC: Recent Labs  Lab 12/01/20 0345 12/02/20 0343 12/03/20 0445  WBC 15.2* 8.8 10.8*  NEUTROABS 12.4* 5.8  --   HGB 9.4* 8.6* 8.9*  HCT 31.2* 28.8* 29.7*  MCV 77.8* 78.7* 79.6*  PLT 896* 792* 177*   Basic Metabolic Panel: Recent Labs  Lab 12/01/20 0939 12/02/20 0343 12/03/20 0445 12/04/20 0400 12/05/20 0406 12/06/20 0353  NA  --  135 135 134* 134* 131*  K  --  3.5  3.7 4.1 4.2 4.3  CL  --  94* 97* 101 104 100  CO2  --  30 25 25  21* 21*  GLUCOSE  --  133* 153* 144* 131* 121*  BUN  --  24* 23* 19 25* 21*  CREATININE  --  0.91 0.67 0.50* 0.54* 0.50*  CALCIUM  --  9.1 8.9 8.5* 8.8* 8.7*  MG 2.2 2.2 1.8 2.1 2.0  --   PHOS 4.5 4.2 3.4 3.4 3.6  --    GFR: Estimated Creatinine Clearance: 107.2 mL/min (A) (by C-G formula based on SCr of 0.5 mg/dL (L)). Liver Function Tests: Recent Labs  Lab 12/02/20 0343 12/05/20 0406  AST 14* 25  ALT 15 42  ALKPHOS 120 149*  BILITOT 1.0 0.8  PROT 6.1* 6.1*  ALBUMIN 2.3* 2.3*   No results for input(s): LIPASE, AMYLASE in the last 168 hours. No results for input(s): AMMONIA in the last 168 hours. Coagulation Profile: No results for input(s): INR, PROTIME in the last 168 hours. Cardiac Enzymes: No results for input(s): CKTOTAL, CKMB, CKMBINDEX, TROPONINI in the last 168 hours. BNP (last 3 results) No results for input(s): PROBNP in the last 8760 hours. HbA1C: No results for input(s): HGBA1C in the last 72 hours. CBG: Recent Labs  Lab 12/06/20 1614 12/06/20 2045 12/07/20 0016 12/07/20 0405 12/07/20 0810  GLUCAP 119* 127* 112* 114* 110*   Lipid Profile: No results for input(s): CHOL, HDL, LDLCALC, TRIG, CHOLHDL, LDLDIRECT in the last 72 hours. Thyroid Function Tests: No results for input(s): TSH, T4TOTAL, FREET4, T3FREE, THYROIDAB in the last 72 hours. Anemia Panel: No results for input(s): VITAMINB12, FOLATE, FERRITIN, TIBC, IRON, RETICCTPCT in the last 72 hours. Sepsis Labs: Recent Labs  Lab 12/01/20 0321  LATICACIDVEN 1.1    No results found for this or any previous visit (from the past 240 hour(s)).   Radiology Studies: No results found.   LOS: 22 days   Antonieta Pert, MD [facility]  12/07/2020, 11:00 AM

## 2020-12-07 NOTE — Progress Notes (Signed)
PHARMACY - TOTAL PARENTERAL NUTRITION CONSULT NOTE   Indication: Prolonged ileus  Patient Measurements: Height: 6' (182.9 cm) Weight: 69.4 kg (153 lb) IBW/kg (Calculated) : 77.6 TPN AdjBW (KG): 69.4 Body mass index is 20.75 kg/m. Usual Weight: 170 lbs  Assessment: Eric [name] is a 50 y.o. male no known medical history. Patient presented secondary to right groin pain and found to have a retroperitoneal abscess/bowel perforation/cecal mass concerning for neoplasm. Incidental finding of COVID-19 positive status.  Patient is  s/p colon resection on 2/17. On 2/19 he vomited 500 ml green emesis and vomited again 2/20. Noted to have worsening ileus. NGT placed. Pharmacy consulted to resume TPN for prolonged ileus.   Glucose / Insulin: No history of diabetes. A1c 5.2. CBGs 110-130s. Used 8 units SSI/24h.  Electrolytes: K 4.3 (goal >=4), Mg 2, Phos 3.6. Corrected Ca 10.1, HCO3 low, Cl up to wnl. Na low at 131 (2/25) Renal: SCr stable at 0.5, BUN 21 LFTs / TGs: LFTs and Tbili WNL, alk phos 149, TG 128.  Prealbumin / albumin: Prealbumin 31.5 (2/14) >>21 (2/21). Albumin 2.3.  Intake / Output; MIVF: UOP 0.5 ml/kg/hr. NGT now clamped, R JP drain 160 ml/24 hrs. Ileostomy output high on 2/24 at 2L, but was noted to stop on 2/25 AM, + emesis, + nausea GI Imaging:  2/4 CT abd: long segment masslike thickening on cecum concerning for neoplasm, R retroperitoneal abscess 2/9 MRI abd: lesion in peripheral R liver - possible intrahepatic abscess vs. Metastatic disease. Dec fluid in R iliopsoas.  Surgeries / Procedures:  2/7: R retroperitoneal drain placed, replaced in OR 2/17 2/17: ex-lap, right colectomy, end ileostomy, cystoscopy with stent placement  Central access: Triple lumen PICC 11/18/20 TPN start date: 11/18/20-11/24/20; resumed 2/20  Nutritional Goals (per RD recommendation on 2/16): Kcal:  2200-2430; Protein:  110-125; Fluid:  >/= 2.2 L Goal TPN rate is 100 mL/hr (provides 120 g of protein and an  average of 2328 kcals per day - with 21 g/L lipids on MWF)  Current Nutrition:  TPN Diet changed from soft diet back to CLD Ensure Plus tid between meals (each provides 350 kcal and 20g protein)  Plan:  Increase TPN back to up to goal rate of 100 cc/hr Lipids MWF due to national shortage (previously held from 2/7 d/t shortage) Electrolytes in TPN: Continue Na 110 mEq/L, K  44mEq/L, Ca 65mEq/L, cont Mg 58mEq/L, and cont Phos 52mmol/L, cont Cl:Ac 1:2 Add standard MVI and trace elements to TPN Continue moderate q4h SSI and adjust as needed F/u TPN labs, BMET + Mg in the AM Follow up further advancement of diet and supplements and ability to wean TPN  Thank you for allowing pharmacy to be a part of this patient's care.  Alycia Rossetti, PharmD, BCPS Clinical Pharmacist Clinical phone for 12/07/2020: [phone] 12/07/2020 10:08 AM   **Pharmacist phone directory can now be found on [email or web address] ([email or web address]).  Listed under [place].

## 2020-12-07 NOTE — Progress Notes (Signed)
Pt had a great morning and early afternoon. He ambulated 2.5 laps around the unit at lunchtime (using front wheel walker) and 2 laps around the unit around 1600 (with IV pole).  NT reported pt had vomited "just a little bit" around lunchtime.  Pt ostomy was not putting out as much as on previous days. Pt was having increased burping starting around 1400, on Thursday the gas had been going into his ileostomy bag.  Notified Dr. Maren Beach and Dr. Barry Dienes at [phone] and 1800.  Dr. Barry Dienes ordered DG Abd 2 view, and stated if pt vomited again, he would need an NG tube to low wall suction.  Pt vomited 900 mL of green emesis at 1854, just as transport arrived to take him down to imaging.  Notified pt and night shift nurse, Jewel, that pt would need NG tube to low wall suction upon return to unit. Put in NPO order for diet until able to be seen by Trauma.

## 2020-12-07 NOTE — Progress Notes (Addendum)
9 Days Post-Op  Subjective: Ostomy output stopped yesterday late morning. Patient reports an episode of emesis and ongoing mild intermittent nausea. Has been OOB.   ROS: See above, otherwise other systems negative  Objective: Vital signs in last 24 hours: Temp:  [97.7 F (36.5 C)-98.5 F (36.9 C)] 98.4 F (36.9 C) (02/26 0817) Pulse Rate:  [83-93] 83 (02/26 0817) Resp:  [16-18] 18 (02/26 0817) BP: (119-134)/(83-93) 119/92 (02/26 0817) SpO2:  [97 %-99 %] 98 % (02/26 0817) Last BM Date: 12/06/20  Intake/Output from previous day: 02/25 0701 - 02/26 0700 In: 1544.2 [P.O.:660; I.V.:484.2; IV Piggyback:400] Out: 1210 [Urine:750; Emesis/NG output:300; Drains:160] Intake/Output this shift: No intake/output data recorded.   PE: Gen: alert, cooperative NAD Abd: soft, appropriately tender, +BS, ileostomy in R hemiabdomen - edematous and viable - very small amt liquid stool efflux in pouch with some flatus. Midline wound dressing c/d/i  ileostomy - 0  Emesis - 300 cc     JP - 160 cc SS   BMET Recent Labs    12/05/20 0406 12/06/20 0353  NA 134* 131*  K 4.2 4.3  CL 104 100  CO2 21* 21*  GLUCOSE 131* 121*  BUN 25* 21*  CREATININE 0.54* 0.50*  CALCIUM 8.8* 8.7*   CMP     Component Value Date/Time   NA 131 (L) 12/06/2020 0353   K 4.3 12/06/2020 0353   CL 100 12/06/2020 0353   CO2 21 (L) 12/06/2020 0353   GLUCOSE 121 (H) 12/06/2020 0353   BUN 21 (H) 12/06/2020 0353   CREATININE 0.50 (L) 12/06/2020 0353   CALCIUM 8.7 (L) 12/06/2020 0353   PROT 6.1 (L) 12/05/2020 0406   ALBUMIN 2.3 (L) 12/05/2020 0406   AST 25 12/05/2020 0406   ALT 42 12/05/2020 0406   ALKPHOS 149 (H) 12/05/2020 0406   BILITOT 0.8 12/05/2020 0406   GFRNONAA >60 12/06/2020 0353   Anti-infectives: Anti-infectives (From admission, onward)   Start     Dose/Rate Route Frequency Ordered Stop   11/30/20 1045  ceFAZolin (ANCEF) IVPB 2g/100 mL premix  Status:  Discontinued        2 g 200 mL/hr over 30  Minutes Intravenous On call to O.R. 11/30/20 0957 11/30/20 1043   11/16/20 1000  remdesivir 100 mg in sodium chloride 0.9 % 100 mL IVPB       "Followed by" Linked Group Details   100 mg 200 mL/hr over 30 Minutes Intravenous Daily 11/15/20 2135 11/17/20 1002   11/15/20 2230  piperacillin-tazobactam (ZOSYN) IVPB 3.375 g        3.375 g 12.5 mL/hr over 240 Minutes Intravenous Every 8 hours 11/15/20 2144 12/03/20 2359   11/15/20 2134  remdesivir 200 mg in sodium chloride 0.9% 250 mL IVPB       "Followed by" Linked Group Details   200 mg 580 mL/hr over 30 Minutes Intravenous Once 11/15/20 2135 11/16/20 0029   11/15/20 1700  piperacillin-tazobactam (ZOSYN) IVPB 3.375 g        3.375 g 100 mL/hr over 30 Minutes Intravenous  Once 11/15/20 1657 11/15/20 1826       Assessment/Plan Anemia Hyponatremia Tobacco abuse COVID-19positive  Severe malnutrition - prealbumin 5.8 (2/8), continue TPN  Colonmass with perforationand RTP abscess Stage IV adenocarcinomacolon  S/P EXPLORATORY LAPAROTOMY, RIGHT COLECTOMY, END ILEOSTOMY CREATION, CYSTOSCOPY W/ STENT PLACEMENT 11/28/20 (Dr. Rosendo Gros; Dr. Abner Greenspan) - POD#9 - AFVSS - s/p IR drain placement 2/7 - this drain was removed intra-operatively and new blake drain placed; IR  cx Enterobacter aerogenes - post-op ileus slowly resoliving, 2 L from stoma 2/24-25 then output stopped and pt developed nausea/vomiting. Back of diet to CLD + ensure. Continue TPN at current rate or increase back to full support. closely monitor ileostomy output. Stop fiber and iron supplementation. For recurrent nausea or emesis would check KUB and possibly replace NG tube to LIWS. - IV Zosyn completed 2/22 at [facility]  - PRN analgesics and antiemetics  - OOB/IS  - will likely need port-a-cath prior to discharge, will discuss timing with MD - potentially Monday 2/28 or early next week  Liver metastasis  - x3 noted on MRI, unsure if abscess vs metastatic disease.IR liver biopsy  11/20/2020; -A. LIVER, RIGHT MASS, NEEDLE CORE BIOPSY:  - Adenocarcinoma.  COMMENT:  The morphology suggests metastatic colorectal adenocarcinoma.  Immunohistochemistry will be performed and reported as an addendum.  There is sufficient tissue for additional testing.   ID -zosyn 2/4-2/22 FEN - TPN, back off diet to CLD + ensure  VTE:  SQ heparin Foley -D/C-ed 2/18 Dispo - ileus, surgical planning for port placement    LOS: 22 days    Jill Alexanders , [facility] 12/07/2020, 9:21 AM Please see Amion for pager number during day hours 7:00am-4:30pm or 7:00am -11:30am on weekends

## 2020-12-08 LAB — CBC
HCT: 31.5 % — ABNORMAL LOW (ref 39.0–52.0)
Hemoglobin: 9.5 g/dL — ABNORMAL LOW (ref 13.0–17.0)
MCH: 24 pg — ABNORMAL LOW (ref 26.0–34.0)
MCHC: 30.2 g/dL (ref 30.0–36.0)
MCV: 79.5 fL — ABNORMAL LOW (ref 80.0–100.0)
Platelets: 690 10*3/uL — ABNORMAL HIGH (ref 150–400)
RBC: 3.96 MIL/uL — ABNORMAL LOW (ref 4.22–5.81)
WBC: 13.4 10*3/uL — ABNORMAL HIGH (ref 4.0–10.5)
nRBC: 0 % (ref 0.0–0.2)

## 2020-12-08 LAB — GLUCOSE, CAPILLARY
Glucose-Capillary: 116 mg/dL — ABNORMAL HIGH (ref 70–99)
Glucose-Capillary: 118 mg/dL — ABNORMAL HIGH (ref 70–99)
Glucose-Capillary: 133 mg/dL — ABNORMAL HIGH (ref 70–99)
Glucose-Capillary: 145 mg/dL — ABNORMAL HIGH (ref 70–99)
Glucose-Capillary: 149 mg/dL — ABNORMAL HIGH (ref 70–99)
Glucose-Capillary: 152 mg/dL — ABNORMAL HIGH (ref 70–99)
Glucose-Capillary: 171 mg/dL — ABNORMAL HIGH (ref 70–99)

## 2020-12-08 LAB — BASIC METABOLIC PANEL
Anion gap: 10 (ref 5–15)
BUN: 27 mg/dL — ABNORMAL HIGH (ref 6–20)
CO2: 26 mmol/L (ref 22–32)
Calcium: 9.2 mg/dL (ref 8.9–10.3)
Chloride: 96 mmol/L — ABNORMAL LOW (ref 98–111)
Creatinine, Ser: 0.61 mg/dL (ref 0.61–1.24)
GFR, Estimated: >60 mL/min (ref >60)
Glucose, Bld: 148 mg/dL — ABNORMAL HIGH (ref 70–99)
Potassium: 4.3 mmol/L (ref 3.5–5.1)
Sodium: 132 mmol/L — ABNORMAL LOW (ref 135–145)

## 2020-12-08 LAB — MAGNESIUM: Magnesium: 1.9 mg/dL (ref 1.7–2.4)

## 2020-12-08 MED ORDER — TRAVASOL 10 % IV SOLN
INTRAVENOUS | Status: AC
  Filled 2020-12-08: qty 1200

## 2020-12-08 MED ORDER — MAGNESIUM SULFATE 2 GM/50ML IV SOLN
2.0000 g | Freq: Once | INTRAVENOUS | Status: AC
  Administered 2020-12-08: 2 g via INTRAVENOUS
  Filled 2020-12-08: qty 50

## 2020-12-08 NOTE — Progress Notes (Signed)
PROGRESS NOTE    Eric [name]  ELF:[id number] DOB: 03-24-1971 DOA: 11/15/2020 PCP: [facility]   Chief Complaint  Patient presents with  . Groin Pain   Brief Narrative:50 year old male with no known PMH presented with right groin pain found to have retroperitoneal abscess with bowel perforation cecal mass,incidental Covid positive seen by surgery placed on TPN for severe protein calorie malnutrition and had IR drain the abscess.Patient underwent right hemicolectomy with ileostomy 2/17. Post-op course complicated by ileus, NGT was placed 2/20.  Patient is being managed with IV antibiotics leukocytosis much improved, remains on TPN with plan to wean off TPN as ileus improves. 2/22-attempted NG clamping but he vomited.  Antibiotic Zosyn completed 2/22. 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  Appreciate surgery input on board, continue n.p.o. for now.  Continue TPN/IV fluids pharmacy managing.  Increase activity PT, pulmonary toilet/incentive spirometry.  Minimize narcotic Being followed by oncology-will need port placement prior to  discharge.  Sepsis POA 2/2 perforated colon mass:Sepsis resolved.Completed abx Zosyn 2/4-2/22.  Patient is afebrile, WBC still trending up likely from ileus/bowel obstruction from as #1- Monitor Recent Labs  Lab 12/02/20 0343 12/03/20 0445 12/08/20 0333  WBC 8.8 10.8* 13.4*   Blood loss anemia/microcytic anemia with hemoglobin 5.4 g on admission 2/2 chronic GI bleed from colon cancer.S/P PRBC x4 units, hemoglobin stable.  Monitor closely. Recent Labs  Lab 12/02/20 0343 12/03/20 0445 12/08/20 0333  HGB 8.6* 8.9* 9.5*  HCT 28.8* 29.7* 31.5*   Deconditioning/debility/fatigue: Continue PT OT  Moderate protein calorie malnutrition: N.p.o. for now due to SBO/ileus.  Thrombocytosis likely reactive.  Downtrending, continue to monitor Recent Labs  Lab 12/02/20 0343 12/03/20 0445 12/08/20 0333  PLT 792* 835* 690*   Hyponatremia 120S on admission in the setting of below.  Mildly low. Monitor.  Continue on TPN. Recent Labs  Lab 12/04/20 0400 12/05/20 0406 12/06/20 0353 12/07/20 1145 12/08/20 0333  NA 134* 134* 131* 130* 132*   Hyperglycemic-likely reactive A1c is normal. COVID-19 virus infection positive incidental/asymptomatic s/px3 days remdesivir,off isolation 2/15. Tobacco abuse: Counseled to quit  Nutrition: Diet Order            Diet NPO time specified  Diet effective now                 Nutrition Problem: Moderate Malnutrition Etiology:  (illness) Signs/Symptoms: severe muscle depletion,moderate fat depletion,moderate muscle depletion Interventions: Ensure Enlive (each supplement provides 350kcal and 20 grams of protein),TPN Pt's Body mass index is 20.75 kg/m.  DVT prophylaxis: heparin injection 5,000 Units Start: 11/29/20 0800 SCDs Start: 11/15/20 2135 Code Status:   Code Status: Full Code  Family Communication: Plan of care discussed with patient at bedside. He is self updating his sister and friend Discussed  with surgery team.  Status is:  Inpatient Remains inpatient appropriate because:Inpatient level of care appropriate due to severity of illness and ongoing post op care- ngt  drain and TPN  Dispo: The patient is from: Home              Anticipated d/c is to: Home.Reports he will be living with his sister              Anticipated d/c date is:2-3 days once bowel obstruction ileus resolves unable to tolerate p.o., will need Port-A-Cath prior to discharge.                Patient currently is not medically stable to d/c.   Difficult to place patient No   Consultants:see note  Procedures:see note  Unresulted Labs (From admission, onward)          Start     Ordered   12/02/20 0500  Comprehensive metabolic panel  (TPN Lab Panel)  Every Mon,Thu (0500),   R     Question:  Specimen collection method  Answer:  IV Team=IV Team collect   12/01/20 1056   12/02/20 0500  Magnesium  (TPN Lab Panel)  Every Mon,Thu (0500),   R     Question:  Specimen collection method  Answer:  IV Team=IV Team collect   12/01/20 1056   12/02/20 0500  Phosphorus  (TPN Lab Panel)  Every Mon,Thu (0500),   R     Question:  Specimen collection method  Answer:  IV Team=IV Team collect   12/01/20 1056   12/02/20 0500  CBC  (TPN Lab Panel)  Every Monday (0500),   R     Question:  Specimen collection method  Answer:  IV Team=IV Team collect   12/01/20 1056   12/02/20 0500  Differential  (TPN Lab Panel)  Every Monday (0500),   R     Question:  Specimen collection method  Answer:  IV Team=IV Team collect   12/01/20 1056   12/02/20 0500  Triglycerides  (TPN Lab Panel)  Every Monday (0500),   R     Question:  Specimen collection method  Answer:  IV Team=IV Team collect   12/01/20 1056   12/02/20 0500  Prealbumin  (TPN Lab Panel)  Every Monday (0500),   R     Question:  Specimen collection method  Answer:  IV Team=IV Team collect   12/01/20 1056   12/01/20 0500  CBC  Daily,   R     Question:  Specimen collection method  Answer:  IV Team=IV Team collect    11/30/20 0916          Culture/Microbiology    Component Value Date/Time   SDES BIOPSY LIVER 11/20/2020 1622   SPECREQUEST Normal 11/20/2020 1622   CULT  11/20/2020 1622    No growth aerobically or anaerobically. Performed at [facility], [place] [address], [place], [place] [address]    REPTSTATUS 11/25/2020 FINAL 11/20/2020 1622    Other culture-see note  Medications: Scheduled Meds: . acetaminophen  1,000 mg Oral Q6H  . Chlorhexidine Gluconate Cloth  6 each Topical Daily  . feeding supplement  237 mL Oral TID BM  . heparin injection (subcutaneous)  5,000 Units Subcutaneous Q8H  . influenza vac split quadrivalent PF  0.5 mL Intramuscular Tomorrow-1000  . insulin aspart  0-15 Units Subcutaneous Q4H  . lidocaine  1 patch Transdermal Daily  . lip balm  1 application Topical BID  . nicotine  21 mg Transdermal Daily  . pantoprazole (PROTONIX) IV  40 mg Intravenous Q12H  . sodium chloride flush  5 mL Intracatheter Q8H   Continuous Infusions: . methocarbamol (ROBAXIN) IV 1,000 mg (12/08/20 0555)  . ondansetron [facility]) IV 8 mg (12/01/20 0152)  . TPN ADULT (ION) 100 mL/hr at 12/08/20 8527    Antimicrobials: Anti-infectives (From admission, onward)   Start     Dose/Rate Route Frequency Ordered Stop   11/30/20 1045  ceFAZolin (ANCEF) IVPB 2g/100 mL premix  Status:  Discontinued        2 g 200 mL/hr over 30 Minutes Intravenous On call to O.R. 11/30/20 0957 11/30/20 1043   11/16/20 1000  remdesivir 100 mg in sodium chloride 0.9 % 100 mL IVPB       "Followed by" Linked Group Details   100 mg 200 mL/hr over 30 Minutes Intravenous Daily 11/15/20 2135 11/17/20 1002   11/15/20 2230  piperacillin-tazobactam (ZOSYN) IVPB 3.375 g        3.375 g 12.5 mL/hr over 240 Minutes Intravenous Every 8 hours 11/15/20 2144 12/03/20 2359   11/15/20 2134  remdesivir 200 mg in sodium chloride 0.9% 250 mL IVPB       "Followed by" Linked Group Details   200 mg 580 mL/hr over 30 Minutes  Intravenous Once 11/15/20 2135 11/16/20 0029   11/15/20 1700  piperacillin-tazobactam (ZOSYN) IVPB 3.375 g        3.375 g 100 mL/hr over 30 Minutes Intravenous  Once 11/15/20 1657 11/15/20 1826     Objective: Vitals: Today's Vitals   12/08/20 0203 12/08/20 0400 12/08/20 0552 12/08/20 0622  BP:  (!) 111/93    Pulse:  85    Resp:  18    Temp:  98.3 F (36.8 C)    TempSrc:  Oral    SpO2:  98%    Weight:      Height:      PainSc: Asleep  8  Asleep    Intake/Output Summary (Last 24 hours) at 12/08/2020 0754 Last data filed at 12/08/2020 0659 Gross per 24 hour  Intake 1558.21 ml  Output 3260 ml  Net -1701.79 ml   Filed Weights   11/15/20 2000  Weight: 69.4 kg   Weight change:   Intake/Output from previous day: 02/26 0701 - 02/27 0700 In: 1558.2 [P.O.:60; I.V.:1298.2; IV Piggyback:200] Out: 7824 [Urine:600; Emesis/NG output:2500; Drains:60; Stool:100] Intake/Output this shift: No intake/output data recorded. Filed Weights   11/15/20 2000  Weight: 69.4 kg    Examination: General exam: AAOx3, old for his age appears thin,, NAD, weak appearing. HEENT:Oral mucosa moist, Ear/Nose WNL grossly, dentition normal. Respiratory system: bilaterally clear,no wheezing or crackles,no use of accessory muscle Cardiovascular system: S1 & S2 +, No JVD,. Gastrointestinal system: Abdomen soft, mild tenderness present surgical site with intact dressing, ilesotomy  And drain+BS sluggish Nervous System:Alert, awake, moving extremities and grossly nonfocal Extremities: No edema, distal peripheral pulses palpable.  Skin: No rashes,no icterus. MSK: Normal muscle bulk,tone, power  Data Reviewed: I have personally reviewed following labs and imaging studies CBC: Recent Labs  Lab 12/02/20 0343 12/03/20 0445 12/08/20 0333  WBC 8.8 10.8* 13.4*  NEUTROABS 5.8  --   --   HGB 8.6* 8.9* 9.5*  HCT 28.8* 29.7* 31.5*  MCV 78.7* 79.6* 79.5*  PLT 792* 835* 235*   Basic Metabolic Panel: Recent  Labs  Lab 12/01/20 0939 12/01/20 0939 12/02/20 0343 12/03/20 0445 12/04/20 0400 12/05/20 0406 12/06/20 0353 12/07/20 1145 12/08/20 0333  NA  --    < >  135 135 134* 134* 131* 130* 132*  K  --    < > 3.5 3.7 4.1 4.2 4.3 4.5 4.3  CL  --    < > 94* 97* 101 104 100 97* 96*  CO2  --    < > 30 25 25  21* 21* 22 26  GLUCOSE  --    < > 133* 153* 144* 131* 121* 122* 148*  BUN  --    < > 24* 23* 19 25* 21* 23* 27*  CREATININE  --    < > 0.91 0.67 0.50* 0.54* 0.50* 0.65 0.61  CALCIUM  --    < > 9.1 8.9 8.5* 8.8* 8.7* 9.4 9.2  MG 2.2  --  2.2 1.8 2.1 2.0  --   --  1.9  PHOS 4.5  --  4.2 3.4 3.4 3.6  --   --   --    < > = values in this interval not displayed.   GFR: Estimated Creatinine Clearance: 107.2 mL/min (by C-G formula based on SCr of 0.61 mg/dL). Liver Function Tests: Recent Labs  Lab 12/02/20 0343 12/05/20 0406  AST 14* 25  ALT 15 42  ALKPHOS 120 149*  BILITOT 1.0 0.8  PROT 6.1* 6.1*  ALBUMIN 2.3* 2.3*   No results for input(s): LIPASE, AMYLASE in the last 168 hours. No results for input(s): AMMONIA in the last 168 hours. Coagulation Profile: No results for input(s): INR, PROTIME in the last 168 hours. Cardiac Enzymes: No results for input(s): CKTOTAL, CKMB, CKMBINDEX, TROPONINI in the last 168 hours. BNP (last 3 results) No results for input(s): PROBNP in the last 8760 hours. HbA1C: No results for input(s): HGBA1C in the last 72 hours. CBG: Recent Labs  Lab 12/07/20 1147 12/07/20 1540 12/07/20 2006 12/07/20 2359 12/08/20 0341  GLUCAP 118* 129* 146* 171* 145*   Lipid Profile: No results for input(s): CHOL, HDL, LDLCALC, TRIG, CHOLHDL, LDLDIRECT in the last 72 hours. Thyroid Function Tests: No results for input(s): TSH, T4TOTAL, FREET4, T3FREE, THYROIDAB in the last 72 hours. Anemia Panel: No results for input(s): VITAMINB12, FOLATE, FERRITIN, TIBC, IRON, RETICCTPCT in the last 72 hours. Sepsis Labs: No results for input(s): PROCALCITON, LATICACIDVEN in the  last 168 hours.  No results found for this or any previous visit (from the past 240 hour(s)).   Radiology Studies: DG Abd 2 Views  Result Date: 12/07/2020 CLINICAL DATA:  Ileus. EXAM: ABDOMEN - 2 VIEW COMPARISON:  December 01, 2020. FINDINGS: Severely dilated small bowel loops are noted with air-fluid levels concerning for distal small bowel obstruction. No colonic dilatation is noted. Surgical drain is noted in right lower quadrant. Midline surgical staples are noted. IMPRESSION: Severely dilated small bowel loops are noted with air-fluid levels concerning for distal small bowel obstruction. Electronically Signed   By: Marijo Conception M.D.   On: 12/07/2020 19:19   DG Abd Portable 1V  Result Date: 12/07/2020 CLINICAL DATA:  NG tube placement EXAM: PORTABLE ABDOMEN - 1 VIEW COMPARISON:  None. FINDINGS: The bowel gas pattern is normal. Tip the NG tube is seen within the gastric fundus. A right-sided PICC is seen with the tip in the lower SVC. No radio-opaque calculi or other significant radiographic abnormality are seen. IMPRESSION: Tip the NG tube within the gastric fundus. Electronically Signed   By: Prudencio Pair M.D.   On: 12/07/2020 20:54     LOS: 79 days   Antonieta Pert, MD [facility]  12/08/2020, 7:54 AM

## 2020-12-08 NOTE — Progress Notes (Signed)
Pt ambulated around the unit once today, using the walker.  Pt sister demonstrated how to measure, cut, and change out the ostomy pouch. Ostomy pouch put out liquid and gas today.  Pt pain was centered around midline incision today, especially when he moved.

## 2020-12-08 NOTE — Progress Notes (Signed)
Inserted 16Fr NGT to right nare, patient tolerated procedure well. corrent placement verified by DG abd portable 1v, NGT currently to LIWS. output is greenish in color. Will continue to close monitor patient.

## 2020-12-08 NOTE — Progress Notes (Signed)
10 Days Post-Op  Subjective: Large volume emesis overnight requiring NG tube replacement. Has some gas in ostomy bag but no stool. Denies fever, chills, SOB, urinary sxs.   ROS: See above, otherwise other systems negative  Objective: Vital signs in last 24 hours: Temp:  [98.3 F (36.8 C)-98.7 F (37.1 C)] 98.3 F (36.8 C) (02/27 0400) Pulse Rate:  [85-93] 85 (02/27 0400) Resp:  [18] 18 (02/27 0400) BP: (111-123)/(80-93) 111/93 (02/27 0400) SpO2:  [97 %-98 %] 98 % (02/27 0400) Last BM Date: 12/07/20  Intake/Output from previous day: 02/26 0701 - 02/27 0700 In: 1558.2 [P.O.:60; I.V.:1298.2; IV Piggyback:200] Out: 3260 [Urine:600; Emesis/NG output:2500; Drains:60; Stool:100] Intake/Output this shift: No intake/output data recorded.   PE: Gen: alert, cooperative NAD Abd: soft, appropriately tender, +BS, ileostomy in R hemiabdomen - edematous and viable - small amt gas in pouch.   ileostomy - 100  NG- 300 cc     JP - 60 cc SS   BMET Recent Labs    12/07/20 1145 12/08/20 0333  NA 130* 132*  K 4.5 4.3  CL 97* 96*  CO2 22 26  GLUCOSE 122* 148*  BUN 23* 27*  CREATININE 0.65 0.61  CALCIUM 9.4 9.2   CMP     Component Value Date/Time   NA 132 (L) 12/08/2020 0333   K 4.3 12/08/2020 0333   CL 96 (L) 12/08/2020 0333   CO2 26 12/08/2020 0333   GLUCOSE 148 (H) 12/08/2020 0333   BUN 27 (H) 12/08/2020 0333   CREATININE 0.61 12/08/2020 0333   CALCIUM 9.2 12/08/2020 0333   PROT 6.1 (L) 12/05/2020 0406   ALBUMIN 2.3 (L) 12/05/2020 0406   AST 25 12/05/2020 0406   ALT 42 12/05/2020 0406   ALKPHOS 149 (H) 12/05/2020 0406   BILITOT 0.8 12/05/2020 0406   GFRNONAA >60 12/08/2020 0333   Anti-infectives: Anti-infectives (From admission, onward)   Start     Dose/Rate Route Frequency Ordered Stop   11/30/20 1045  ceFAZolin (ANCEF) IVPB 2g/100 mL premix  Status:  Discontinued        2 g 200 mL/hr over 30 Minutes Intravenous On call to O.R. 11/30/20 0957 11/30/20 1043    11/16/20 1000  remdesivir 100 mg in sodium chloride 0.9 % 100 mL IVPB       "Followed by" Linked Group Details   100 mg 200 mL/hr over 30 Minutes Intravenous Daily 11/15/20 2135 11/17/20 1002   11/15/20 2230  piperacillin-tazobactam (ZOSYN) IVPB 3.375 g        3.375 g 12.5 mL/hr over 240 Minutes Intravenous Every 8 hours 11/15/20 2144 12/03/20 2359   11/15/20 2134  remdesivir 200 mg in sodium chloride 0.9% 250 mL IVPB       "Followed by" Linked Group Details   200 mg 580 mL/hr over 30 Minutes Intravenous Once 11/15/20 2135 11/16/20 0029   11/15/20 1700  piperacillin-tazobactam (ZOSYN) IVPB 3.375 g        3.375 g 100 mL/hr over 30 Minutes Intravenous  Once 11/15/20 1657 11/15/20 1826       Assessment/Plan Anemia Hyponatremia Tobacco abuse COVID-19positive  Severe malnutrition - prealbumin 5.8 (2/8), continue TPN  Colonmass with perforationand RTP abscess Stage IV adenocarcinomacolon  S/P EXPLORATORY LAPAROTOMY, RIGHT COLECTOMY, END ILEOSTOMY CREATION, CYSTOSCOPY W/ STENT PLACEMENT 11/28/20 (Dr. Rosendo Gros; Dr. Abner Greenspan) - POD#10 WBC 13.4 from 10.8 - AFVSS - s/p IR drain placement 2/7 - this drain was removed intra-operatively and new blake drain placed; IR cx Enterobacter aerogenes -  post-op ileus, 2 L from stoma 2/24-25 then output stopped and pt developed nausea/vomiting. NG replaced 2/16. Continue full support TPN. May need repeat CT scan this week for ongoing ileus and leukocytosis. - IV Zosyn completed 2/22 at [facility]  - PRN analgesics and antiemetics  - OOB/IS  - will likely need port-a-cath prior to discharge, will discuss timing with MD - potentially early next week  Liver metastasis  - x3 noted on MRI, unsure if abscess vs metastatic disease.IR liver biopsy 11/20/2020; -A. LIVER, RIGHT MASS, NEEDLE CORE BIOPSY:  - Adenocarcinoma.  COMMENT:  The morphology suggests metastatic colorectal adenocarcinoma.  Immunohistochemistry will be performed and reported as an addendum.   There is sufficient tissue for additional testing.   ID -zosyn 2/4-2/22 FEN - TPN, back off diet to CLD + ensure  VTE:  SQ heparin Foley -D/C-ed 2/18 Dispo - ileus, surgical planning for port placement, prolonged post-op ileus     LOS: 23 days    Jill Alexanders , [facility] 12/08/2020, 10:51 AM Please see Amion for pager number during day hours 7:00am-4:30pm or 7:00am -11:30am on weekends

## 2020-12-08 NOTE — Progress Notes (Signed)
PHARMACY - TOTAL PARENTERAL NUTRITION CONSULT NOTE   Indication: Prolonged ileus  Patient Measurements: Height: 6' (182.9 cm) Weight: 69.4 kg (153 lb) IBW/kg (Calculated) : 77.6 TPN AdjBW (KG): 69.4 Body mass index is 20.75 kg/m. Usual Weight: 170 lbs  Assessment: Eric [name] is a 50 y.o. male no known medical history. Patient presented secondary to right groin pain and found to have a retroperitoneal abscess/bowel perforation/cecal mass concerning for neoplasm. Incidental finding of COVID-19 positive status.  Patient is  s/p colon resection on 2/17. On 2/19 he vomited 500 ml green emesis and vomited again 2/20. Noted to have worsening ileus. NGT placed. Pharmacy consulted to resume TPN for prolonged ileus.   Glucose / Insulin: No history of diabetes. A1c 5.2. CBGs 110-130s. Used 7 units SSI/12h.  Electrolytes: K 4.3 (goal >=4), Mg 1.9, Phos 3.6. Corrected Ca 10.5, HCO3 up to wnl, Cl now low, Na up to 132 Renal: SCr stable at 0.61, BUN 27 LFTs / TGs: LFTs and Tbili WNL, alk phos 149, TG 128.  Prealbumin / albumin: Prealbumin 31.5 (2/14) >>21 (2/21). Albumin 2.3 (2/24) Intake / Output; MIVF: UOP 0.4 ml/kg/hr. NGT re-inserted 2/26 with 1600 cc output, noted ileostomy output high on 2/24 at 2L, but was noted to stop on 2/25 AM, only 100 cc charted on 2/26, R Eric drain 60 ml/24 hrs + emesis prior to NGT re-insertion, + nausea GI Imaging:  2/4 CT abd: long segment masslike thickening on cecum concerning for neoplasm, R retroperitoneal abscess 2/9 MRI abd: lesion in peripheral R liver - possible intrahepatic abscess vs. Metastatic disease. Dec fluid in R iliopsoas.  Surgeries / Procedures:  2/7: R retroperitoneal drain placed, replaced in OR 2/17 2/17: ex-lap, right colectomy, end ileostomy, cystoscopy with stent placement  Central access: Triple lumen PICC 11/18/20 TPN start date: 11/18/20-11/24/20; resumed 2/20  Nutritional Goals (per RD recommendation on 2/16): Kcal:  2200-2430; Protein:   110-125; Fluid:  >/= 2.2 L Goal TPN rate is 100 mL/hr (provides 120 g of protein and an average of 2328 kcals per day - with 21 g/L lipids on MWF)  Current Nutrition:  TPN NPO  Plan:  Continue TPN at goal rate of 100 cc/hr Lipids MWF due to national shortage (previously held from 2/7 d/t shortage) Electrolytes in TPN: increase Na to 120 mEq/L, K  60mq/L, remove Ca today, incr Mg to 7 mEq/L, and cont Phos 164ml/L, adjust Cl:Ac 1:1 Magnesium 2g IV x 1 dose today Add standard MVI and trace elements to TPN Continue moderate q4h SSI and adjust as needed F/u TPN labs Follow up ability to re-attempt diet and eventual wean of TPN  Thank you for allowing pharmacy to be a part of this patient's care.  ElAlycia RossettiPharmD, BCPS Clinical Pharmacist Clinical phone for 12/08/2020: [phone]/27/2022 7:06 AM   **Pharmacist phone directory can now be found on [facility] ([email or web address]).  Listed under [place]

## 2020-12-09 ENCOUNTER — Inpatient Hospital Stay (HOSPITAL_COMMUNITY): Payer: Self-pay

## 2020-12-09 LAB — DIFFERENTIAL
Abs Immature Granulocytes: 0 10*3/uL (ref 0.00–0.07)
Basophils Absolute: 0.2 10*3/uL — ABNORMAL HIGH (ref 0.0–0.1)
Basophils Relative: 2 %
Eosinophils Absolute: 0.5 10*3/uL (ref 0.0–0.5)
Eosinophils Relative: 5 %
Lymphocytes Relative: 19 %
Lymphs Abs: 1.8 10*3/uL (ref 0.7–4.0)
Monocytes Absolute: 0.9 10*3/uL (ref 0.1–1.0)
Monocytes Relative: 9 %
Neutro Abs: 6.2 10*3/uL (ref 1.7–7.7)
Neutrophils Relative %: 65 %
nRBC: 0 /100 WBC

## 2020-12-09 LAB — COMPREHENSIVE METABOLIC PANEL
ALT: 35 U/L (ref 0–44)
AST: 20 U/L (ref 15–41)
Albumin: 2.6 g/dL — ABNORMAL LOW (ref 3.5–5.0)
Alkaline Phosphatase: 279 U/L — ABNORMAL HIGH (ref 38–126)
Anion gap: 10 (ref 5–15)
BUN: 31 mg/dL — ABNORMAL HIGH (ref 6–20)
CO2: 25 mmol/L (ref 22–32)
Calcium: 9 mg/dL (ref 8.9–10.3)
Chloride: 99 mmol/L (ref 98–111)
Creatinine, Ser: 0.63 mg/dL (ref 0.61–1.24)
GFR, Estimated: >60 mL/min (ref >60)
Glucose, Bld: 135 mg/dL — ABNORMAL HIGH (ref 70–99)
Potassium: 4 mmol/L (ref 3.5–5.1)
Sodium: 134 mmol/L — ABNORMAL LOW (ref 135–145)
Total Bilirubin: 0.5 mg/dL (ref 0.3–1.2)
Total Protein: 6.4 g/dL — ABNORMAL LOW (ref 6.5–8.1)

## 2020-12-09 LAB — CBC
HCT: 31.2 % — ABNORMAL LOW (ref 39.0–52.0)
Hemoglobin: 9.3 g/dL — ABNORMAL LOW (ref 13.0–17.0)
MCH: 24.5 pg — ABNORMAL LOW (ref 26.0–34.0)
MCHC: 29.8 g/dL — ABNORMAL LOW (ref 30.0–36.0)
MCV: 82.3 fL (ref 80.0–100.0)
Platelets: 667 10*3/uL — ABNORMAL HIGH (ref 150–400)
RBC: 3.79 MIL/uL — ABNORMAL LOW (ref 4.22–5.81)
WBC: 9.5 10*3/uL (ref 4.0–10.5)
nRBC: 0 % (ref 0.0–0.2)

## 2020-12-09 LAB — GLUCOSE, CAPILLARY
Glucose-Capillary: 117 mg/dL — ABNORMAL HIGH (ref 70–99)
Glucose-Capillary: 120 mg/dL — ABNORMAL HIGH (ref 70–99)
Glucose-Capillary: 129 mg/dL — ABNORMAL HIGH (ref 70–99)
Glucose-Capillary: 136 mg/dL — ABNORMAL HIGH (ref 70–99)

## 2020-12-09 LAB — MAGNESIUM: Magnesium: 2.2 mg/dL (ref 1.7–2.4)

## 2020-12-09 LAB — PHOSPHORUS: Phosphorus: 4.5 mg/dL (ref 2.5–4.6)

## 2020-12-09 LAB — PREALBUMIN: Prealbumin: 33.1 mg/dL (ref 18–38)

## 2020-12-09 LAB — TRIGLYCERIDES: Triglycerides: 85 mg/dL (ref <150)

## 2020-12-09 MED ORDER — SODIUM CHLORIDE 0.9 % IV BOLUS
500.0000 mL | Freq: Once | INTRAVENOUS | Status: AC
  Administered 2020-12-09: 500 mL via INTRAVENOUS

## 2020-12-09 MED ORDER — INSULIN ASPART 100 UNIT/ML ~~LOC~~ SOLN
0.0000 [IU] | Freq: Four times a day (QID) | SUBCUTANEOUS | Status: DC
  Administered 2020-12-10: 2 [IU] via SUBCUTANEOUS

## 2020-12-09 MED ORDER — IOHEXOL 300 MG/ML  SOLN
100.0000 mL | Freq: Once | INTRAMUSCULAR | Status: AC | PRN
  Administered 2020-12-09: 100 mL via INTRAVENOUS

## 2020-12-09 MED ORDER — TRAVASOL 10 % IV SOLN
INTRAVENOUS | Status: AC
  Filled 2020-12-09: qty 1200

## 2020-12-09 MED ORDER — IOHEXOL 9 MG/ML PO SOLN
500.0000 mL | ORAL | Status: AC
  Administered 2020-12-09 (×2): 500 mL via ORAL

## 2020-12-09 NOTE — Progress Notes (Signed)
PHARMACY - TOTAL PARENTERAL NUTRITION CONSULT NOTE   Indication: Prolonged ileus  Patient Measurements: Height: 6' (182.9 cm) Weight: 69.4 kg (153 lb) IBW/kg (Calculated) : 77.6 TPN AdjBW (KG): 69.4 Body mass index is 20.75 kg/m. Usual Weight: 170 lbs  Assessment: Eric [name] is a 50 y.o. male no known medical history. Patient presented secondary to right groin pain and found to have a retroperitoneal abscess/bowel perforation/cecal mass concerning for neoplasm. Incidental finding of COVID-19 positive status.  Patient is  s/p colon resection on 2/17. On 2/19 he vomited 500 ml green emesis and vomited again 2/20. Noted to have worsening ileus. NGT placed. Pharmacy consulted to resume TPN for prolonged ileus.   Glucose / Insulin: No history of diabetes. A1c 5.2. CBGs 110-150. Used 9 units SSI/12h.  Electrolytes: K 4 (goal >=4), Corrected Ca 10.2, Na 134. all others wnl Renal: SCr stable at 0.6, BUN 31 uptrending LFTs / TGs: LFTs and Tbili WNL, alk phos 279 up, TG 85.  Prealbumin / albumin: Prealbumin 33.1 up (2/28). Albumin 2.6 (2/28) Intake / Output; MIVF: UOP 0.4 ml/kg/hr. NGT re-inserted 2/26 with 3200 cc output last 24hr, noted ileostomy output high on 2/24 at 2L, but was noted to stop on 2/25 AM, only 150 cc charted on 2/28, R JP drain 60 ml/24 hrs  GI Imaging:  2/4 CT abd: long segment masslike thickening on cecum concerning for neoplasm, R retroperitoneal abscess 2/9 MRI abd: lesion in peripheral R liver - possible intrahepatic abscess vs. Metastatic disease. Dec fluid in R iliopsoas.  2/26 DG abd: Severely dilated small bowel loops are noted with air-fluid levels concerning for distal small bowel obstruction. Surgeries / Procedures:  2/7: R retroperitoneal drain placed, replaced in OR 2/17 2/17: ex-lap, right colectomy, end ileostomy, cystoscopy with stent placement  Central access: Triple lumen PICC 11/18/20 TPN start date: 11/18/20-11/24/20; resumed 2/20  Nutritional Goals (per  RD recommendation on 2/16): Kcal:  2200-2430; Protein:  110-125; Fluid:  >/= 2.2 L Goal TPN rate is 100 mL/hr (provides 120 g of protein and an average of 2328 kcals per day - with 21 g/L lipids on MWF)  Current Nutrition:  TPN NPO  Plan:  Continue TPN at goal rate of 100 cc/hr Lipids MWF due to national shortage (previously held from 2/7 d/t shortage) Electrolytes in TPN: increase Na to 130 mEq/L and K to 45 mEq/L;  decrease Phos to 10 mmol/L; continue Ca 0 mEq/L, Mg to 7 mEq/L, Cl:Ac 1:1 Add standard MVI and trace elements to TPN Decrease to moderate q6h SSI and adjust as needed F/u TPN labs Mon/Thurs Follow up ability to re-attempt diet and eventual wean of TPN Additional IV Fluid per team    Thank you for allowing pharmacy to be a part of this patient's care.  Benetta Spar, PharmD, BCPS, BCCP Clinical Pharmacist  Please check AMION for all [place] phone numbers After 10:00 PM, call [place] [phone]

## 2020-12-09 NOTE — Progress Notes (Signed)
PT Cancellation Note  Patient Details Name: Eric [name] MRN: [id number] DOB: Dec 23, 1970   Cancelled Treatment:    Reason Eval/Treat Not Completed: Other (comment).  Pt is out of the room to procedure, reattempt at another time.   Ramond Dial 12/09/2020, 1:17 PM Mee Hives, PT MS Acute Rehab Dept. Number: [place] and [place]

## 2020-12-09 NOTE — Progress Notes (Signed)
PROGRESS NOTE  Eric [name]  TKZ:[id number] DOB: 01-Aug-1971 DOA: 11/15/2020 PCP: [facility], [facility]   Chief Complaint  Patient presents with  . Groin Pain   Brief Narrative:50 year old male with no known PMH presented with right groin pain found to have retroperitoneal abscess with bowel perforation cecal mass,incidental Covid positive seen by surgery placed on TPN for severe protein calorie malnutrition and had IR drain the abscess.Patient underwent right hemicolectomy with ileostomy 2/17. Post-op course complicated by ileus, NGT was placed 2/20.  Patient is being managed with IV antibiotics leukocytosis much improved, remains on TPN with plan to wean off TPN as ileus improves. 2/22-attempted NG clamping but he vomited.  Antibiotic Zosyn completed 2/22. 2/25-diet started 2/26 patient feeling nauseous also vomited, x-ray abdomen showed severely dilated small bowel loops concerning for distal small bowel obstruction-NG tube was placed back in.   Subjective: Reports nausea improved after NG tube placement. Having ostomy output and taking ice chips 3.2 L out from NG tube Pain is controlled.  States he has been ambulating in the hallway.  Assessment & Plan:  Perforated Stage IV colon cancer with RTP abscess s/p right colectomy/ileostomy 11/28/20, s/p ureteral stent, with liver mets/Postop ileus: Started on soft diet 2/25-episode of vomiting and diet scaled down to clear liquid diet-2/26 patient feeling nauseous, also had decreased ostomy output suspected postop ileus-symptomatically improving with NG tube reinsertion.  CCS following closely and has planned for CT abdomen pelvis today.  Patient has been ambulating encouraged to do so.  Minimize narcotics.  On as needed IV Toradol for severe pain.  Continue PT OT, pulmonary toileting/I-S, DVT prophylaxis.  Cont TPN while remains n.p.o. Being followed by oncology-will need port placement prior to discharge.  Sepsis POA 2/2 perforated  colon mass:Sepsis resolved.Completed abx Zosyn 2/4-2/22.Patient is afebrile, leukocytosis resolved.   Recent Labs  Lab 12/03/20 0445 12/08/20 0333 12/09/20 0311  WBC 10.8* 13.4* 9.5   Blood loss anemia/microcytic anemia with hemoglobin 5.4 g on admission 2/2 chronic GI bleed from colon cancer.S/P PRBC x4 units, hemoglobin overall stable. Recent Labs  Lab 12/03/20 0445 12/08/20 0333 12/09/20 0311  HGB 8.9* 9.5* 9.3*  HCT 29.7* 31.5* 31.2*   Deconditioning/debility/fatigue: He is ambulating in the hallway.  Moderate protein calorie malnutrition: N.p.o. for now due to SBO/ileus.  Thrombocytosis likely reactive.  Downtrending, monitor Recent Labs  Lab 12/03/20 0445 12/08/20 0333 12/09/20 0311  PLT 835* 690* 667*   Hyponatremia 120S on admission in the setting of below.  Improving.  Continue TPN  Recent Labs  Lab 12/05/20 0406 12/06/20 0353 12/07/20 1145 12/08/20 0333 12/09/20 0311  NA 134* 131* 130* 132* 134*   Hyperglycemic-likely reactive A1c is normal. COVID-19 virus infection positive incidental/asymptomatic s/px3 days remdesivir,off isolation 2/15. Tobacco abuse: Counseled to quit  Nutrition: Diet Order            Diet NPO time specified Except for: Ice Chips  Diet effective now                 Nutrition Problem: Moderate Malnutrition Etiology:  (illness) Signs/Symptoms: severe muscle depletion,moderate fat depletion,moderate muscle depletion Interventions: Ensure Enlive (each supplement provides 350kcal and 20 grams of protein),TPN Pt's Body mass index is 20.75 kg/m.  DVT prophylaxis: heparin injection 5,000 Units Start: 11/29/20 0800 SCDs Start: 11/15/20 2135 Code Status:   Code Status: Full Code  Family Communication: Plan of care discussed with patient at bedside. He is self updating his sister and friend Discussed with surgery team.  Status is:  Inpatient Remains inpatient appropriate because:Inpatient level of care appropriate due to severity  of illness and ongoing post op care- ngt  drain and TPN  Dispo: The patient is from: Home              Anticipated d/c is to: Home.Reports he will be living with his sister              Anticipated d/c date is:2-3 days once postop ileus resolves, tolerating diet off TPN and after Port-A-Cath placement               Patient currently is not medically stable to d/c.   Difficult to place patient No   Consultants:see note  Procedures:see note  Unresulted Labs (From admission, onward)          Start     Ordered   12/02/20 0500  Comprehensive metabolic panel  (TPN Lab Panel)  Every Mon,Thu (0500),   R     Question:  Specimen collection method  Answer:  IV Team=IV Team collect   12/01/20 1056   12/02/20 0500  Magnesium  (TPN Lab Panel)  Every Mon,Thu (0500),   R     Question:  Specimen collection method  Answer:  IV Team=IV Team collect   12/01/20 1056   12/02/20 0500  Phosphorus  (TPN Lab Panel)  Every Mon,Thu (0500),   R     Question:  Specimen collection method  Answer:  IV Team=IV Team collect   12/01/20 1056   12/02/20 0500  CBC  (TPN Lab Panel)  Every Monday (0500),   R     Question:  Specimen collection method  Answer:  IV Team=IV Team collect   12/01/20 1056   12/02/20 0500  Differential  (TPN Lab Panel)  Every Monday (0500),   R     Question:  Specimen collection method  Answer:  IV Team=IV Team collect   12/01/20 1056   12/02/20 0500  Triglycerides  (TPN Lab Panel)  Every Monday (0500),   R     Question:  Specimen collection method  Answer:  IV Team=IV Team collect   12/01/20 1056   12/02/20 0500  Prealbumin  (TPN Lab Panel)  Every Monday (0500),   R     Question:  Specimen collection method  Answer:  IV Team=IV Team collect   12/01/20 1056   12/01/20 0500  CBC  Daily,   R     Question:  Specimen collection method  Answer:  IV Team=IV Team collect   11/30/20 0916          Culture/Microbiology    Component Value Date/Time   SDES BIOPSY LIVER 11/20/2020 1622    SPECREQUEST Normal 11/20/2020 1622   CULT  11/20/2020 1622    No growth aerobically or anaerobically. Performed at [facility], [place] [address], [place], [place] [address]    REPTSTATUS 11/25/2020 FINAL 11/20/2020 1622    Other culture-see note  Medications: Scheduled Meds: . acetaminophen  1,000 mg Oral Q6H  . Chlorhexidine Gluconate Cloth  6 each Topical Daily  . feeding supplement  237 mL Oral TID BM  . heparin injection (subcutaneous)  5,000 Units Subcutaneous Q8H  . influenza vac split quadrivalent PF  0.5 mL Intramuscular Tomorrow-1000  . insulin aspart  0-15 Units Subcutaneous Q6H  . iohexol  500 mL Oral Q1H  . lidocaine  1 patch Transdermal Daily  . lip balm  1 application Topical BID  . nicotine  21 mg  Transdermal Daily  . pantoprazole (PROTONIX) IV  40 mg Intravenous Q12H  . sodium chloride flush  5 mL Intracatheter Q8H   Continuous Infusions: . methocarbamol (ROBAXIN) IV 1,000 mg (12/09/20 0517)  . ondansetron [facility]) IV 8 mg (12/01/20 0152)  . TPN ADULT (ION) 100 mL/hr at 12/09/20 0659  . TPN ADULT (ION)      Antimicrobials: Anti-infectives (From admission, onward)   Start     Dose/Rate Route Frequency Ordered Stop   11/30/20 1045  ceFAZolin (ANCEF) IVPB 2g/100 mL premix  Status:  Discontinued        2 g 200 mL/hr over 30 Minutes Intravenous On call to O.R. 11/30/20 0957 11/30/20 1043   11/16/20 1000  remdesivir 100 mg in sodium chloride 0.9 % 100 mL IVPB       "Followed by" Linked Group Details   100 mg 200 mL/hr over 30 Minutes Intravenous Daily 11/15/20 2135 11/17/20 1002   11/15/20 2230  piperacillin-tazobactam (ZOSYN) IVPB 3.375 g        3.375 g 12.5 mL/hr over 240 Minutes Intravenous Every 8 hours 11/15/20 2144 12/03/20 2359   11/15/20 2134  remdesivir 200 mg in sodium chloride 0.9% 250 mL IVPB       "Followed by" Linked Group Details   200 mg 580 mL/hr over 30 Minutes Intravenous Once 11/15/20 2135 11/16/20 0029   11/15/20 1700   piperacillin-tazobactam (ZOSYN) IVPB 3.375 g        3.375 g 100 mL/hr over 30 Minutes Intravenous  Once 11/15/20 1657 11/15/20 1826     Objective: Vitals: Today's Vitals   12/09/20 0405 12/09/20 0517 12/09/20 0547 12/09/20 0917  BP: 117/84     Pulse: 85     Resp: 15     Temp: (!) 97.3 F (36.3 C)     TempSrc: Axillary     SpO2: 100%     Weight:      Height:      PainSc:  7  Asleep 2     Intake/Output Summary (Last 24 hours) at 12/09/2020 1030 Last data filed at 12/09/2020 4401 Gross per 24 hour  Intake 1531.31 ml  Output 4510 ml  Net -2978.69 ml   Filed Weights   11/15/20 2000  Weight: 69.4 kg   Weight change:   Intake/Output from previous day: 02/27 0701 - 02/28 0700 In: 1531.3 [I.V.:1331.3; IV Piggyback:200] Out: 0272 [Urine:300; Emesis/NG output:3200; Drains:60; Stool:150] Intake/Output this shift: Total I/O In: -  Out: 800 [Emesis/NG output:800] Filed Weights   11/15/20 2000  Weight: 69.4 kg    Examination: General exam: AAOx3, old for his age, thin frail, on room air,NAD,weak appearing. HEENT:Oral mucosa moist, Ear/Nose WNL grossly, dentition normal. Respiratory system: bilaterally clear,no wheezing or crackles,no use of accessory muscle Cardiovascular system: S1 & S2 +, No JVD. Gastrointestinal system: Abdomen soft, surgical site with intact dressing, JP drain present, ileostomy with greenish stool output, NG tube present bowel sounds sluggish Nervous System:Alert,awake,moving extremities and grossly non-focal. Extremities: No edema, distal peripheral pulses palpable.  Skin: No rashes,no icterus. MSK: thin muscle bulk,tone, power  Data Reviewed: I have personally reviewed following labs and imaging studies CBC: Recent Labs  Lab 12/03/20 0445 12/08/20 0333 12/09/20 0311  WBC 10.8* 13.4* 9.5  NEUTROABS  --   --  6.2  HGB 8.9* 9.5* 9.3*  HCT 29.7* 31.5* 31.2*  MCV 79.6* 79.5* 82.3  PLT 835* 690* 536*   Basic Metabolic Panel: Recent Labs  Lab  12/03/20 0445 12/04/20 0400 12/05/20 0406  12/06/20 0353 12/07/20 1145 12/08/20 0333 12/09/20 0311  NA 135 134* 134* 131* 130* 132* 134*  K 3.7 4.1 4.2 4.3 4.5 4.3 4.0  CL 97* 101 104 100 97* 96* 99  CO2 25 25 21* 21* 22 26 25   GLUCOSE 153* 144* 131* 121* 122* 148* 135*  BUN 23* 19 25* 21* 23* 27* 31*  CREATININE 0.67 0.50* 0.54* 0.50* 0.65 0.61 0.63  CALCIUM 8.9 8.5* 8.8* 8.7* 9.4 9.2 9.0  MG 1.8 2.1 2.0  --   --  1.9 2.2  PHOS 3.4 3.4 3.6  --   --   --  4.5   GFR: Estimated Creatinine Clearance: 107.2 mL/min (by C-G formula based on SCr of 0.63 mg/dL). Liver Function Tests: Recent Labs  Lab 12/05/20 0406 12/09/20 0311  AST 25 20  ALT 42 35  ALKPHOS 149* 279*  BILITOT 0.8 0.5  PROT 6.1* 6.4*  ALBUMIN 2.3* 2.6*   No results for input(s): LIPASE, AMYLASE in the last 168 hours. No results for input(s): AMMONIA in the last 168 hours. Coagulation Profile: No results for input(s): INR, PROTIME in the last 168 hours. Cardiac Enzymes: No results for input(s): CKTOTAL, CKMB, CKMBINDEX, TROPONINI in the last 168 hours. BNP (last 3 results) No results for input(s): PROBNP in the last 8760 hours. HbA1C: No results for input(s): HGBA1C in the last 72 hours. CBG: Recent Labs  Lab 12/08/20 1743 12/08/20 1939 12/08/20 2335 12/09/20 0356 12/09/20 0840  GLUCAP 133* 118* 149* 136* 129*   Lipid Profile: Recent Labs    12/09/20 0311  TRIG 85   Thyroid Function Tests: No results for input(s): TSH, T4TOTAL, FREET4, T3FREE, THYROIDAB in the last 72 hours. Anemia Panel: No results for input(s): VITAMINB12, FOLATE, FERRITIN, TIBC, IRON, RETICCTPCT in the last 72 hours. Sepsis Labs: No results for input(s): PROCALCITON, LATICACIDVEN in the last 168 hours.  No results found for this or any previous visit (from the past 240 hour(s)).   Radiology Studies: DG Abd 2 Views  Result Date: 12/07/2020 CLINICAL DATA:  Ileus. EXAM: ABDOMEN - 2 VIEW COMPARISON:  December 01, 2020.  FINDINGS: Severely dilated small bowel loops are noted with air-fluid levels concerning for distal small bowel obstruction. No colonic dilatation is noted. Surgical drain is noted in right lower quadrant. Midline surgical staples are noted. IMPRESSION: Severely dilated small bowel loops are noted with air-fluid levels concerning for distal small bowel obstruction. Electronically Signed   By: Marijo Conception M.D.   On: 12/07/2020 19:19   DG Abd Portable 1V  Result Date: 12/07/2020 CLINICAL DATA:  NG tube placement EXAM: PORTABLE ABDOMEN - 1 VIEW COMPARISON:  None. FINDINGS: The bowel gas pattern is normal. Tip the NG tube is seen within the gastric fundus. A right-sided PICC is seen with the tip in the lower SVC. No radio-opaque calculi or other significant radiographic abnormality are seen. IMPRESSION: Tip the NG tube within the gastric fundus. Electronically Signed   By: Prudencio Pair M.D.   On: 12/07/2020 20:54     LOS: 24 days   Antonieta Pert, MD [facility]  12/09/2020, 10:30 AM

## 2020-12-09 NOTE — Progress Notes (Signed)
PT Cancellation Note  Patient Details Name: Eric [name] MRN: [id number] DOB: 1971/04/23   Cancelled Treatment:    Reason Eval/Treat Not Completed: Other (comment).  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   Ramond Dial 12/09/2020, 6:21 PM  Mee Hives, PT MS Acute Rehab Dept. Number: [place] and [place]

## 2020-12-09 NOTE — Progress Notes (Addendum)
11 Days Post-Op  Subjective: CC: Patient reports that his nausea has gotten better since NGT placement. Having some generalized abdominal pain. Ate 10 cups of ice in the last 24 hours (3.2L out from NGT). Having some air and stool from ileostomy. Walked the halls yesterday without difficulty. Voiding. Afebrile overnight.   Objective: Vital signs in last 24 hours: Temp:  [97.3 F (36.3 C)-97.7 F (36.5 C)] 97.3 F (36.3 C) (02/28 0405) Pulse Rate:  [85-90] 85 (02/28 0405) Resp:  [15-16] 15 (02/28 0405) BP: (117-125)/(84-86) 117/84 (02/28 0405) SpO2:  [98 %-100 %] 100 % (02/28 0405) Last BM Date: 12/08/20 (ostomy)  Intake/Output from previous day: 02/27 0701 - 02/28 0700 In: 1531.3 [I.V.:1331.3; IV Piggyback:200] Out: 3710 [Urine:300; Emesis/NG output:3200; Drains:60; Stool:150] Intake/Output this shift: No intake/output data recorded.  PE: Gen: alert, cooperative NAD Heart: Reg rate Lungs: Normal rate and effort  Abd: soft, some mild generalized tenderness without one location more than other, +BS, ileostomy in R hemiabdomen - edematous and viable - small amt gas and liquid stool in pouch. Midline wound with intermittent staples removed. Scabbing noted over intermittent portions of the wound. No drainage or signs of infection. No evisceration.              ileostomy - 150             NG- 3200 cc (transparent, yellow)             JP - 60 cc SS   Msk: No LE edema.   Lab Results:  Recent Labs    12/08/20 0333 12/09/20 0311  WBC 13.4* 9.5  HGB 9.5* 9.3*  HCT 31.5* 31.2*  PLT 690* 667*   BMET Recent Labs    12/08/20 0333 12/09/20 0311  NA 132* 134*  K 4.3 4.0  CL 96* 99  CO2 26 25  GLUCOSE 148* 135*  BUN 27* 31*  CREATININE 0.61 0.63  CALCIUM 9.2 9.0   PT/INR No results for input(s): LABPROT, INR in the last 72 hours. CMP     Component Value Date/Time   NA 134 (L) 12/09/2020 0311   K 4.0 12/09/2020 0311   CL 99 12/09/2020 0311   CO2 25 12/09/2020 0311    GLUCOSE 135 (H) 12/09/2020 0311   BUN 31 (H) 12/09/2020 0311   CREATININE 0.63 12/09/2020 0311   CALCIUM 9.0 12/09/2020 0311   PROT 6.4 (L) 12/09/2020 0311   ALBUMIN 2.6 (L) 12/09/2020 0311   AST 20 12/09/2020 0311   ALT 35 12/09/2020 0311   ALKPHOS 279 (H) 12/09/2020 0311   BILITOT 0.5 12/09/2020 0311   GFRNONAA >60 12/09/2020 0311   Lipase  No results found for: LIPASE     Studies/Results: DG Abd 2 Views  Result Date: 12/07/2020 CLINICAL DATA:  Ileus. EXAM: ABDOMEN - 2 VIEW COMPARISON:  December 01, 2020. FINDINGS: Severely dilated small bowel loops are noted with air-fluid levels concerning for distal small bowel obstruction. No colonic dilatation is noted. Surgical drain is noted in right lower quadrant. Midline surgical staples are noted. IMPRESSION: Severely dilated small bowel loops are noted with air-fluid levels concerning for distal small bowel obstruction. Electronically Signed   By: Marijo Conception M.D.   On: 12/07/2020 19:19   DG Abd Portable 1V  Result Date: 12/07/2020 CLINICAL DATA:  NG tube placement EXAM: PORTABLE ABDOMEN - 1 VIEW COMPARISON:  None. FINDINGS: The bowel gas pattern is normal. Tip the NG tube is seen within the gastric fundus.  A right-sided PICC is seen with the tip in the lower SVC. No radio-opaque calculi or other significant radiographic abnormality are seen. IMPRESSION: Tip the NG tube within the gastric fundus. Electronically Signed   By: Prudencio Pair M.D.   On: 12/07/2020 20:54    Anti-infectives: Anti-infectives (From admission, onward)   Start     Dose/Rate Route Frequency Ordered Stop   11/30/20 1045  ceFAZolin (ANCEF) IVPB 2g/100 mL premix  Status:  Discontinued        2 g 200 mL/hr over 30 Minutes Intravenous On call to O.R. 11/30/20 0957 11/30/20 1043   11/16/20 1000  remdesivir 100 mg in sodium chloride 0.9 % 100 mL IVPB       "Followed by" Linked Group Details   100 mg 200 mL/hr over 30 Minutes Intravenous Daily 11/15/20 2135  11/17/20 1002   11/15/20 2230  piperacillin-tazobactam (ZOSYN) IVPB 3.375 g        3.375 g 12.5 mL/hr over 240 Minutes Intravenous Every 8 hours 11/15/20 2144 12/03/20 2359   11/15/20 2134  remdesivir 200 mg in sodium chloride 0.9% 250 mL IVPB       "Followed by" Linked Group Details   200 mg 580 mL/hr over 30 Minutes Intravenous Once 11/15/20 2135 11/16/20 0029   11/15/20 1700  piperacillin-tazobactam (ZOSYN) IVPB 3.375 g        3.375 g 100 mL/hr over 30 Minutes Intravenous  Once 11/15/20 1657 11/15/20 1826       Assessment/Plan Anemia Hyponatremia Tobacco abuse COVID-19positive - off isolation 2/15 Severe malnutrition - prealbumin 5.8 (2/8), now 33.1. Continue TPN   Colonmass with perforationand RTP abscess Stage IV adenocarcinomacolon  S/P EXPLORATORY LAPAROTOMY, RIGHT COLECTOMY, END ILEOSTOMY CREATION, CYSTOSCOPY W/ STENT PLACEMENT 11/28/20 (Dr. Rosendo Gros; Dr. Abner Greenspan) - POD#11  - AFVSS. WBC 9.5 - s/p IR drain placement 2/7 - this drain was removed intra-operatively and new blake drain placed; IR cx Enterobacter aerogenes - [facility] 2/[facility]  - Patient with decreased ostomy output and n/v over the weekend. Suspect post-op ileus. Cont NGT and full support TPN.  - CT A/P today.   - OOB/IS  - Per notes, will likely need port-a-cath prior to discharge, will discuss timing with MD - potentially early next week  Liver metastasis  - x3 noted on MRI, unsure if abscess vs metastatic disease.IR liver biopsy 11/20/2020; -A. LIVER, RIGHT MASS, NEEDLE CORE BIOPSY:  - Adenocarcinoma.  COMMENT:  The morphology suggests metastatic colorectal adenocarcinoma.  Immunohistochemistry will be performed and reported as an addendum.  There is sufficient tissue for additional testing.   ID -zosyn 2/4-2/22 FEN -TPN, NGT to LIWS  VTE: SQ heparin Foley -D/C-ed 2/18. Voiding.  Dispo - ileus, CT A/P. Surgical planning for port placement    LOS: 24 days    Jillyn Ledger , [facility] Surgery 12/09/2020, 8:59 AM Please see Amion for pager number during day hours 7:00am-4:30pm

## 2020-12-09 NOTE — Progress Notes (Signed)
Nutrition Follow-up  DOCUMENTATION CODES:   Non-severe (moderate) malnutrition in context of chronic illness  INTERVENTION:   -D/c Ensure Enlive po TID, each supplement provides 350 kcal and 20 grams of protein -TPN management per pharmacy  NUTRITION DIAGNOSIS:   Moderate Malnutrition related to  (illness) as evidenced by severe muscle depletion,moderate fat depletion,moderate muscle depletion.  Ongoing  GOAL:   Patient will meet greater than or equal to 90% of their needs  Met with TPN  MONITOR:   PO intake,Supplement acceptance,Skin,Weight trends,Labs,I & O's (TPN tolerance)  REASON FOR ASSESSMENT:   Malnutrition Screening Tool    ASSESSMENT:   50 year old male with history significant for tobacco abuse admitted with sepsis due to right retroperitoneal abscess and found to be positive for COVID-19. Pt who has not had any routine medical care for the last 10 years presents with 5 day onset of significant right groin pain that has been progressive and constant associated with chills and night sweats.  2/7 TPN initiated, s/p retroperitoneal abscess drain placement 2/9 Liver lesion biopsy,significant for colorectal adenocarcinoma 2/12- advanced to soft diet  2/13- TPN d/c, downgraded to full liquid diet 2/17- s/pProcedure(s) with comments: EXPLORATORY LAPAROTOMY (N/A) - DOW CASE TO START AT 730AM 4 HOUR CASE RIGHT COLECTOMY (Right) ILEOSTOMY CREATION (N/A) CYSTOSCOPY WITH STENT PLACEMENT (N/A) 2/20- NGT inserted, TPN initiated 2/24- NGT d/c, advanced to clear liquid diet 2/25- advanced to soft diet, TPN wean  2/26- NGT placed for decompression, TPN placed back to goal rate, NPO secondary to ileus  Reviewed I/O's: -2.2 L x 24 hours and -17.7 L since 11/25/20  UOP: 300 ml x 24 hours  NGT output: 3.2 L x 24 hours  Drain output: 60 ml x 24 hours  Ileostomy output: 150 ml x 24 hours  Per MD notes, ileostomy output has decreased.   Pt now NPO with NGT for  decompression. Pt continues to receive TPN- currently receiving at goal rate of 100 ml/hr, which provides 2112 kcals and 120 grams protein, meeting 96% of estimated kcal needs and 100% of estimated protein needs. Pt receiving lipids on Mondays, Wednesdays, and Fridays due to [employer].   Labs reviewed: CBGS: 129-136 (inpatient orders for glycemic control are 0-15 units insulin aspart every 6 hours).   Diet Order:   Diet Order            Diet NPO time specified Except for: Ice Chips  Diet effective now                 EDUCATION NEEDS:   Not appropriate for education at this time  Skin:  Skin Assessment: Skin Integrity Issues: Skin Integrity Issues:: Incisions Incisions: rt upper adomen, abdomen, and perineum  Last BM:  12/06/20 (350 ml via ileostomy)  Height:   Ht Readings from Last 1 Encounters:  11/15/20 6' (1.829 m)    Weight:   Wt Readings from Last 1 Encounters:  11/15/20 69.4 kg   BMI:  Body mass index is 20.75 kg/m.  Estimated Nutritional Needs:   Kcal:  2200-2430  Protein:  110-125 grams  Fluid:  >/= 2.2 L    Loistine Chance, RD, LDN, [place] Registered Dietitian II Certified Diabetes Care and Education Specialist Please refer to [facility] for RD and/or RD on-call/weekend/after hours pager

## 2020-12-10 LAB — GLUCOSE, CAPILLARY
Glucose-Capillary: 128 mg/dL — ABNORMAL HIGH (ref 70–99)
Glucose-Capillary: 113 mg/dL — ABNORMAL HIGH (ref 70–99)
Glucose-Capillary: 116 mg/dL — ABNORMAL HIGH (ref 70–99)
Glucose-Capillary: 102 mg/dL — ABNORMAL HIGH (ref 70–99)

## 2020-12-10 MED ORDER — TRAVASOL 10 % IV SOLN
INTRAVENOUS | Status: DC
  Filled 2020-12-10: qty 1200

## 2020-12-10 MED ORDER — INSULIN ASPART 100 UNIT/ML ~~LOC~~ SOLN: 0.0000 [IU] | Freq: Two times a day (BID) | SUBCUTANEOUS | Status: DC

## 2020-12-10 NOTE — Consult Note (Signed)
[facility] Nurse ostomy follow up Patient receiving care in [place]. I spoke with the patient's sister, Trinda Pascal, over the phone just to verify she was coming today at 1pm for additional ostomy teaching.  She explained she changed it by herself two days ago (Sunday, 2/27), and feels comfortable doing it.  She reports she does not need additional teaching.   Val Riles, RN, MSN, CWOCN, CNS-BC, pager [phone]

## 2020-12-10 NOTE — Progress Notes (Signed)
12 Days Post-Op  Subjective: Patient denies nausea/vomiting. Ileostomy functioning.  Objective: Vital signs in last 24 hours: Temp:  [98 F (36.7 C)-98.6 F (37 C)] 98 F (36.7 C) (02/28 2035) Pulse Rate:  [67-75] 67 (03/01 0328) Resp:  [18] 18 (03/01 0328) BP: (118-142)/(75-80) 118/75 (03/01 0328) SpO2:  [97 %-100 %] 99 % (03/01 0328) Last BM Date: 12/09/20 (ileostomy)  Intake/Output from previous day: 02/28 0701 - 03/01 0700 In: 1513.6 [I.V.:1313.6; IV Piggyback:200] Out: 2860 [Urine:1375; Emesis/NG output:800; Drains:60; Stool:625] Intake/Output this shift: No intake/output data recorded.  PE: Gen: alert, cooperative NAD Heart: Reg rate Lungs: Normal rate and effort  Abd: soft, nondistended, nontender. RLQ ileostomy pink, protuberant and productive of stool. Clean dressing in place over midline incision. JP serosanguinous. Msk: No LE edema.   Lab Results:  Recent Labs    12/08/20 0333 12/09/20 0311  WBC 13.4* 9.5  HGB 9.5* 9.3*  HCT 31.5* 31.2*  PLT 690* 667*   BMET Recent Labs    12/08/20 0333 12/09/20 0311  NA 132* 134*  K 4.3 4.0  CL 96* 99  CO2 26 25  GLUCOSE 148* 135*  BUN 27* 31*  CREATININE 0.61 0.63  CALCIUM 9.2 9.0   PT/INR No results for input(s): LABPROT, INR in the last 72 hours. CMP     Component Value Date/Time   NA 134 (L) 12/09/2020 0311   K 4.0 12/09/2020 0311   CL 99 12/09/2020 0311   CO2 25 12/09/2020 0311   GLUCOSE 135 (H) 12/09/2020 0311   BUN 31 (H) 12/09/2020 0311   CREATININE 0.63 12/09/2020 0311   CALCIUM 9.0 12/09/2020 0311   PROT 6.4 (L) 12/09/2020 0311   ALBUMIN 2.6 (L) 12/09/2020 0311   AST 20 12/09/2020 0311   ALT 35 12/09/2020 0311   ALKPHOS 279 (H) 12/09/2020 0311   BILITOT 0.5 12/09/2020 0311   GFRNONAA >60 12/09/2020 0311   Lipase  No results found for: LIPASE     Studies/Results: CT ABDOMEN PELVIS W CONTRAST  Result Date: 12/09/2020 CLINICAL DATA:  Postop abdominal pain and distension.  Fever. Perforated colon carcinoma. EXAM: CT ABDOMEN AND PELVIS WITH CONTRAST TECHNIQUE: Multidetector CT imaging of the abdomen and pelvis was performed using the standard protocol following bolus administration of intravenous contrast. CONTRAST:  152mL OMNIPAQUE IOHEXOL 300 MG/ML  SOLN COMPARISON:  11/25/2020 FINDINGS: Lower Chest: No acute findings. Hepatobiliary: Several hypovascular hepatic masses are seen, some which are new or increased since previous study. Dominant lesion in anterior segment the right lobe measures 4.3 x 2.3 cm on image 32/3, compared to 3.4 x 2.2 cm previously. A hypovascular mass in the right lobe along posterior margin the gallbladder fossa currently measures 2.7 x 2.1 cm on image 29/3, compared to 2.3 x 1.8 cm previously. Two small hypovascular masses adjacent to the gallbladder fossa are also slightly increased in size since previous study. These are consistent with progressive liver metastases. Small cyst in the lateral segment of the left lobe is stable. Gallbladder is unremarkable. No evidence of biliary ductal dilatation. Pancreas:  No mass or inflammatory changes. Spleen: Within normal limits in size and appearance. Adrenals/Urinary Tract: No masses identified. No evidence of ureteral calculi or hydronephrosis. Stomach/Bowel: Patient has undergone right colectomy and right lower quadrant ileostomy since prior study. Surgical drain is seen in the right abdomen. Nasogastric tube is seen within the stomach. No evidence of bowel obstruction or abscess. Vascular/Lymphatic: Right iliopsoas surgical drain is seen place. Persistent lobulated soft tissue density  in the right retroperitoneum along the psoas and iliacus muscles, at site of previous percutaneous drainage catheter. This shows no significant change since prior study. No new or enlarging soft tissue masses are identified. No lymphadenopathy identified. Reproductive:  No mass or other significant abnormality. Other:  None.  Musculoskeletal:  No suspicious bone lesions identified. IMPRESSION: Interval right colectomy and right lower quadrant ileostomy. No evidence of abscess or bowel obstruction. Stable right retroperitoneal soft tissue density along the psoas and iliacus muscles, at site of previous percutaneous drainage catheter. Metastatic disease cannot be excluded. Mild progression of liver metastases since prior study. Electronically Signed   By: Marlaine Hind M.D.   On: 12/09/2020 13:49    Anti-infectives: Anti-infectives (From admission, onward)   Start     Dose/Rate Route Frequency Ordered Stop   11/30/20 1045  ceFAZolin (ANCEF) IVPB 2g/100 mL premix  Status:  Discontinued        2 g 200 mL/hr over 30 Minutes Intravenous On call to O.R. 11/30/20 0957 11/30/20 1043   11/16/20 1000  remdesivir 100 mg in sodium chloride 0.9 % 100 mL IVPB       "Followed by" Linked Group Details   100 mg 200 mL/hr over 30 Minutes Intravenous Daily 11/15/20 2135 11/17/20 1002   11/15/20 2230  piperacillin-tazobactam (ZOSYN) IVPB 3.375 g        3.375 g 12.5 mL/hr over 240 Minutes Intravenous Every 8 hours 11/15/20 2144 12/03/20 2359   11/15/20 2134  remdesivir 200 mg in sodium chloride 0.9% 250 mL IVPB       "Followed by" Linked Group Details   200 mg 580 mL/hr over 30 Minutes Intravenous Once 11/15/20 2135 11/16/20 0029   11/15/20 1700  piperacillin-tazobactam (ZOSYN) IVPB 3.375 g        3.375 g 100 mL/hr over 30 Minutes Intravenous  Once 11/15/20 1657 11/15/20 1826       Assessment/Plan Anemia Hyponatremia Tobacco abuse COVID-19positive - off isolation 2/15 Severe malnutrition - prealbumin 5.8 (2/8), now 33.1. Continue TPN   Colonmass with perforationand RTP abscess Stage IV adenocarcinomacolon with liver mets S/P EXPLORATORY LAPAROTOMY, RIGHT COLECTOMY, END ILEOSTOMY CREATION, CYSTOSCOPY W/ STENT PLACEMENT 11/28/20 (Dr. Rosendo Gros; Dr. Abner Greenspan) - POD#12 - AFVSS. - s/p IR drain placement 2/7 - this drain was  removed intra-operatively and new blake drain placed; IR cx Enterobacter aerogenes - IV Zosyn completed 2/22 at [facility]  - Advance to clear liquid diet today. Continue TPN until tolerating adequate PO intake.  - OOB/IS  - Will plan for port-a-cath placement later this week prior to discharge.   ID -zosyn 2/4-2/22 FEN -TPN, clear liquid diet VTE: SQ heparin Foley -D/C-ed 2/18. Voiding.  Dispo - inpatient    LOS: 25 days   Michaelle Birks, MD [facility] Surgery General, Hepatobiliary and Pancreatic Surgery 12/10/20 8:30 AM Please see Amion for pager number during day hours 7:00am-4:30pm

## 2020-12-10 NOTE — Progress Notes (Signed)
PHARMACY - TOTAL PARENTERAL NUTRITION CONSULT NOTE   Indication: Prolonged ileus  Patient Measurements: Height: 6' (182.9 cm) Weight: 69.4 kg (153 lb) IBW/kg (Calculated) : 77.6 TPN AdjBW (KG): 69.4 Body mass index is 20.75 kg/m. Usual Weight: 170 lbs  Assessment: Eric [name] is a 50 y.o. male no known medical history. Patient presented secondary to right groin pain and found to have a retroperitoneal abscess/bowel perforation/cecal mass concerning for neoplasm. Incidental finding of COVID-19 positive status.  Patient is  s/p colon resection on 2/17. On 2/19 he vomited 500 ml green emesis and vomited again 2/20. Noted to have worsening ileus. NGT placed. Pharmacy consulted to resume TPN for prolonged ileus.   Glucose / Insulin: No history of diabetes. A1c 5.2. CBGs 110-150. Used 2 units SSI/24h.  Electrolytes: last labs 2/28: K 4 (goal >=4), Corrected Ca 10.2, Na 134. all others wnl Renal: last labs 2/28: SCr stable at 0.6, BUN 31 uptrending LFTs / TGs: LFTs and Tbili WNL, alk phos 279 up, TG 85.  Prealbumin / albumin: Prealbumin 33.1 up (2/28). Albumin 2.6 (2/28) Intake / Output; MIVF: 561ml IVF;  UOP 0.8 ml/kg/hr. NGT re-inserted 2/26 with 800 cc output over 12 hrs (none charted for evening shift); noted ileostomy output high on 2/24 at 2L, down to 625 cc last 24 hrs; R JP drain 60 ml/24 hrs  GI Imaging:  2/4 CT abd: long segment masslike thickening on cecum concerning for neoplasm, R retroperitoneal abscess 2/9 MRI abd: lesion in peripheral R liver - possible intrahepatic abscess vs. Metastatic disease. Dec fluid in R iliopsoas.  2/26 DG abd: Severely dilated small bowel loops are noted with air-fluid levels concerning for distal small bowel obstruction. 2/28 CT abd: Interval right colectomy and right lower quadrant ileostomy. No evidence of abscess or bowel obstruction  Surgeries / Procedures:  2/7: R retroperitoneal drain placed, replaced in OR 2/17 2/17: ex-lap, right colectomy,  end ileostomy, cystoscopy with stent placement  Central access: Triple lumen PICC 11/18/20 TPN start date: 11/18/20-11/24/20; resumed 2/20  Nutritional Goals (per RD recommendation on 2/16): Kcal:  2200-2430; Protein:  110-125; Fluid:  >/= 2.2 L Goal TPN rate is 100 mL/hr (provides 120 g of protein and an average of 2328 kcals per day - with 21 g/L lipids on MWF)  Current Nutrition:  TPN NPO - may start to advance diet 3/1   Plan:  Continue TPN at goal rate of 100 cc/hr Lipids MWF due to national shortage  Electrolytes in TPN: decrease K to 40 mEq/L given decreased NGT output;  Continue Na to 130 mEq/L, Ca 0 mEq/L, Mg to 7 mEq/L, Phos 10 mmol/L;  Cl:Ac 1:1 Add standard MVI and trace elements to TPN Decrease to moderate q12h SSI and adjust as needed F/u TPN labs Mon/Thurs Follow up diet advancement and eventual wean of TPN Additional IV fluid as needed per team    Thank you for allowing pharmacy to be a part of this patient's care.  Benetta Spar, PharmD, BCPS, BCCP Clinical Pharmacist  Please check AMION for all [place] phone numbers After 10:00 PM, call [place] [phone]

## 2020-12-10 NOTE — Consult Note (Addendum)
[facility] Nurse ostomy follow up Patient receiving care in [place]. Stoma type/location: RUQ ileostomy Stomal assessment/size: approximately 1 3/4 inches, round, moist, budded Peristomal assessment: deferred. Pouch changed 2 days ago, not needing changed today. Treatment options for stomal/peristomal skin: barrier ring Output: thin, greenish/brown Ostomy pouching: 2pc.  Education provided: Patient demonstrated how to open/close, clean, a sample pouch; and, he burped the existing pouch. Enrolled patient in [place] Start Discharge program: Yes and box of supplies have arrived to his home per the patient. No additional teaching is needed. Supplies are in the room for f/u ostomy care. WOC is signing off, reconsult if needed. Val Riles, RN, MSN, CWOCN, CNS-BC, pager [phone]

## 2020-12-10 NOTE — Progress Notes (Signed)
PROGRESS NOTE  Eric [name]  ZOX:[id number] DOB: [birth year] DOA: 11/15/2020 PCP: [facility], [facility]   Chief Complaint  Patient presents with  . Groin Pain   Brief Narrative:50 year old male with no known PMH presented with right groin pain found to have retroperitoneal abscess with bowel perforation cecal mass,incidental Covid positive seen by surgery placed on TPN for severe protein calorie malnutrition and had IR drain the abscess.Patient underwent right hemicolectomy with ileostomy 2/17. Post-op course complicated by ileus, NGT was placed 2/20.  Patient is being managed with IV antibiotics leukocytosis much improved, remains on TPN with plan to wean off TPN as ileus improves. 2/22-attempted NG clamping but he vomited.  Antibiotic Zosyn completed 2/22. 2/25-diet started 2/26 patient feeling nauseous also vomited, x-ray abdomen showed severely dilated small bowel loops concerning for distal small bowel obstruction-NG tube was placed back in.  Subjective: Patient denies any nausea vomiting [place] functioning well now. Pain is controlled.  Assessment & Plan:  Perforated Stage IV colon cancer with RTP abscess s/p right colectomy/ileostomy 11/28/20, s/p ureteral stent, with liver mets/Postop ileus Did not tolerate soft diet 2/25-NG tube reinserted 2/26, now off NG tube diet is being advanced to clear liquid diet. Surgery managing. Repeat CT abdomen 2/28-no evidence of abscess or bowel obstruction, stable right retroperitoneal soft tissue density along the psoas and iliac [place] muscle, metastatic disease cannot be excluded, mild progression of liver mets Cont TPN to be continued until adequate oral intake. Continue pain management.PTOT/Dvt prophylaxis. Being followed by oncology-will need port placement prior to discharge later this week.  Sepsis POA 2/2 perforated colon mass:Sepsis resolved.Completed abx Zosyn 2/4-2/22.culture had Enterobacter errogeneous. Wbc normal. Recent Labs   Lab 12/08/20 0333 12/09/20 0311  WBC 13.4* 9.5   Blood loss anemia/microcytic anemia with hemoglobin 5.4 g on admission 2/2 chronic GI bleed from colon cancer.S/P PRBC x4 units, Hb stable. Recent Labs  Lab 12/08/20 0333 12/09/20 0311  HGB 9.5* 9.3*  HCT 31.5* 31.2*   Deconditionind-ambulating well.  Moderate protein calorie malnutrition: Augment diet/TPN as above.  Thrombocytosis likely reactive. Downtrending. Recent Labs  Lab 12/08/20 0333 12/09/20 0311  PLT 690* 667*   Hyponatremia 120S on admission. overall improved Recent Labs  Lab 12/05/20 0406 12/06/20 0353 12/07/20 1145 12/08/20 0333 12/09/20 0311  NA 134* 131* 130* 132* 134*   Hyperglycemic-likely reactive A1c is normal. COVID-19 virus infection positive incidental/asymptomatic s/px3 days remdesivir,off isolation 2/15. Tobacco abuse: Counseled to quit  Nutrition: Diet Order            Diet clear liquid Room service appropriate? Yes; Fluid consistency: Thin  Diet effective now                 Nutrition Problem: Moderate Malnutrition Etiology:  (illness) Signs/Symptoms: severe muscle depletion,moderate fat depletion,moderate muscle depletion Interventions: Ensure Enlive (each supplement provides 350kcal and 20 grams of protein),TPN Pt's Body mass index is 20.75 kg/m.  DVT prophylaxis: heparin injection 5,000 Units Start: 11/29/20 0800 SCDs Start: 11/15/20 2135 Code Status:   Code Status: Full Code  Family Communication: Plan of care discussed with patient at bedside. He is self updating his sister and friend  Status is: Inpatient Remains inpatient appropriate because:Inpatient level of care appropriate due to severity of illness and ongoing post op care- ngt  drain and TPN  Dispo: The patient is from: Home              Anticipated d/c is to: Home.Reports he will be living with his sister  Anticipated d/c date is: Later this week after putting Port-A-Cath and once he tolerates diet,  await surgery plan.               Patient currently is not medically stable to d/c.   Difficult to place patient No   Consultants:see note  Procedures:see note  Unresulted Labs (From admission, onward)          Start     Ordered   12/02/20 0500  Comprehensive metabolic panel  (TPN Lab Panel)  Every Mon,Thu (0500),   R     Question:  Specimen collection method  Answer:  IV Team=IV Team collect   12/01/20 1056   12/02/20 0500  Magnesium  (TPN Lab Panel)  Every Mon,Thu (0500),   R     Question:  Specimen collection method  Answer:  IV Team=IV Team collect   12/01/20 1056   12/02/20 0500  Phosphorus  (TPN Lab Panel)  Every Mon,Thu (0500),   R     Question:  Specimen collection method  Answer:  IV Team=IV Team collect   12/01/20 1056   12/02/20 0500  CBC  (TPN Lab Panel)  Every Monday (0500),   R     Question:  Specimen collection method  Answer:  IV Team=IV Team collect   12/01/20 1056   12/02/20 0500  Differential  (TPN Lab Panel)  Every Monday (0500),   R     Question:  Specimen collection method  Answer:  IV Team=IV Team collect   12/01/20 1056   12/02/20 0500  Triglycerides  (TPN Lab Panel)  Every Monday (0500),   R     Question:  Specimen collection method  Answer:  IV Team=IV Team collect   12/01/20 1056   12/02/20 0500  Prealbumin  (TPN Lab Panel)  Every Monday (0500),   R     Question:  Specimen collection method  Answer:  IV Team=IV Team collect   12/01/20 1056   12/01/20 0500  CBC  Daily,   R     Question:  Specimen collection method  Answer:  IV Team=IV Team collect   11/30/20 0916          Culture/Microbiology    Component Value Date/Time   SDES BIOPSY LIVER 11/20/2020 1622   SPECREQUEST Normal 11/20/2020 1622   CULT  11/20/2020 1622    No growth aerobically or anaerobically. Performed at [facility], [place] [address] [place], [place] [address]    REPTSTATUS 11/25/2020 FINAL 11/20/2020 1622    Other culture-see note  Medications: Scheduled Meds: .  acetaminophen  1,000 mg Oral Q6H  . Chlorhexidine Gluconate Cloth  6 each Topical Daily  . heparin injection (subcutaneous)  5,000 Units Subcutaneous Q8H  . influenza vac split quadrivalent PF  0.5 mL Intramuscular Tomorrow-1000  . insulin aspart  0-15 Units Subcutaneous Q12H  . lidocaine  1 patch Transdermal Daily  . lip balm  1 application Topical BID  . nicotine  21 mg Transdermal Daily  . pantoprazole (PROTONIX) IV  40 mg Intravenous Q12H  . sodium chloride flush  5 mL Intracatheter Q8H   Continuous Infusions: . methocarbamol (ROBAXIN) IV 1,000 mg (12/10/20 0550)  . ondansetron [facility]) IV 8 mg (12/01/20 0152)  . TPN ADULT (ION) 100 mL/hr at 12/10/20 0659  . TPN ADULT (ION)      Antimicrobials: Anti-infectives (From admission, onward)   Start     Dose/Rate Route Frequency Ordered Stop   11/30/20 1045  ceFAZolin (  ANCEF) IVPB 2g/100 mL premix  Status:  Discontinued        2 g 200 mL/hr over 30 Minutes Intravenous On call to O.R. 11/30/20 0957 11/30/20 1043   11/16/20 1000  remdesivir 100 mg in sodium chloride 0.9 % 100 mL IVPB       "Followed by" Linked Group Details   100 mg 200 mL/hr over 30 Minutes Intravenous Daily 11/15/20 2135 11/17/20 1002   11/15/20 2230  piperacillin-tazobactam (ZOSYN) IVPB 3.375 g        3.375 g 12.5 mL/hr over 240 Minutes Intravenous Every 8 hours 11/15/20 2144 12/03/20 2359   11/15/20 2134  remdesivir 200 mg in sodium chloride 0.9% 250 mL IVPB       "Followed by" Linked Group Details   200 mg 580 mL/hr over 30 Minutes Intravenous Once 11/15/20 2135 11/16/20 0029   11/15/20 1700  piperacillin-tazobactam (ZOSYN) IVPB 3.375 g        3.375 g 100 mL/hr over 30 Minutes Intravenous  Once 11/15/20 1657 11/15/20 1826     Objective: Vitals: Today's Vitals   12/10/20 0025 12/10/20 0328 12/10/20 0545 12/10/20 1053  BP:  118/75    Pulse:  67    Resp:  18    Temp:      TempSrc:      SpO2:  99%    Weight:      Height:      PainSc: Asleep  7  8      Intake/Output Summary (Last 24 hours) at 12/10/2020 1100 Last data filed at 12/10/2020 1058 Gross per 24 hour  Intake 1513.6 ml  Output 2715 ml  Net -1201.4 ml   Filed Weights   11/15/20 2000  Weight: 69.4 kg   Weight change:   Intake/Output from previous day: 02/28 0701 - 03/01 0700 In: 1513.6 [I.V.:1313.6; IV Piggyback:200] Out: 2860 [Urine:1375; Emesis/NG output:800; Drains:60; Stool:625] Intake/Output this shift: Total I/O In: -  Out: 270 [Urine:300; Drains:30; Stool:400] Filed Weights   11/15/20 2000  Weight: 69.4 kg    Examination: General exam: AAOx3,NAD,weak appearing. HEENT:Oral mucosa moist, Ear/Nose WNL grossly, dentition normal. Respiratory system: bilaterally diminished,no wheezing or crackles,no use of accessory muscle Cardiovascular system: S1 & S2 +, No JVD,. Gastrointestinal system: Abdomen soft, ileostomy present with greenish stool output, surgical site with intact staple, bowel sounds present. Nondistended mildly tender. Nervous System:Alert, awake, moving extremities and grossly nonfocal Extremities: No edema, distal peripheral pulses palpable.  Skin: No rashes,no icterus. MSK: thin muscle bulk,tone, powerr  Data Reviewed: I have personally reviewed following labs and imaging studies CBC: Recent Labs  Lab 12/08/20 0333 12/09/20 0311  WBC 13.4* 9.5  NEUTROABS  --  6.2  HGB 9.5* 9.3*  HCT 31.5* 31.2*  MCV 79.5* 82.3  PLT 690* 350*   Basic Metabolic Panel: Recent Labs  Lab 12/04/20 0400 12/05/20 0406 12/06/20 0353 12/07/20 1145 12/08/20 0333 12/09/20 0311  NA 134* 134* 131* 130* 132* 134*  K 4.1 4.2 4.3 4.5 4.3 4.0  CL 101 104 100 97* 96* 99  CO2 25 21* 21* 22 26 25   GLUCOSE 144* 131* 121* 122* 148* 135*  BUN 19 25* 21* 23* 27* 31*  CREATININE 0.50* 0.54* 0.50* 0.65 0.61 0.63  CALCIUM 8.5* 8.8* 8.7* 9.4 9.2 9.0  MG 2.1 2.0  --   --  1.9 2.2  PHOS 3.4 3.6  --   --   --  4.5   GFR: Estimated Creatinine Clearance: 107.2 mL/min (by  C-G formula based  on SCr of 0.63 mg/dL). Liver Function Tests: Recent Labs  Lab 12/05/20 0406 12/09/20 0311  AST 25 20  ALT 42 35  ALKPHOS 149* 279*  BILITOT 0.8 0.5  PROT 6.1* 6.4*  ALBUMIN 2.3* 2.6*   No results for input(s): LIPASE, AMYLASE in the last 168 hours. No results for input(s): AMMONIA in the last 168 hours. Coagulation Profile: No results for input(s): INR, PROTIME in the last 168 hours. Cardiac Enzymes: No results for input(s): CKTOTAL, CKMB, CKMBINDEX, TROPONINI in the last 168 hours. BNP (last 3 results) No results for input(s): PROBNP in the last 8760 hours. HbA1C: No results for input(s): HGBA1C in the last 72 hours. CBG: Recent Labs  Lab 12/09/20 0356 12/09/20 0840 12/09/20 1636 12/09/20 2347 12/10/20 0534  GLUCAP 136* 129* 120* 117* 128*   Lipid Profile: Recent Labs    12/09/20 0311  TRIG 85   Thyroid Function Tests: No results for input(s): TSH, T4TOTAL, FREET4, T3FREE, THYROIDAB in the last 72 hours. Anemia Panel: No results for input(s): VITAMINB12, FOLATE, FERRITIN, TIBC, IRON, RETICCTPCT in the last 72 hours. Sepsis Labs: No results for input(s): PROCALCITON, LATICACIDVEN in the last 168 hours.  No results found for this or any previous visit (from the past 240 hour(s)).   Radiology Studies: CT ABDOMEN PELVIS W CONTRAST  Result Date: 12/09/2020 CLINICAL DATA:  Postop abdominal pain and distension. Fever. Perforated colon carcinoma. EXAM: CT ABDOMEN AND PELVIS WITH CONTRAST TECHNIQUE: Multidetector CT imaging of the abdomen and pelvis was performed using the standard protocol following bolus administration of intravenous contrast. CONTRAST:  135mL OMNIPAQUE IOHEXOL 300 MG/ML  SOLN COMPARISON:  11/25/2020 FINDINGS: Lower Chest: No acute findings. Hepatobiliary: Several hypovascular hepatic masses are seen, some which are new or increased since previous study. Dominant lesion in anterior segment the right lobe measures 4.3 x 2.3 cm on image  32/3, compared to 3.4 x 2.2 cm previously. A hypovascular mass in the right lobe along posterior margin the gallbladder fossa currently measures 2.7 x 2.1 cm on image 29/3, compared to 2.3 x 1.8 cm previously. Two small hypovascular masses adjacent to the gallbladder fossa are also slightly increased in size since previous study. These are consistent with progressive liver metastases. Small cyst in the lateral segment of the left lobe is stable. Gallbladder is unremarkable. No evidence of biliary ductal dilatation. Pancreas:  No mass or inflammatory changes. Spleen: Within normal limits in size and appearance. Adrenals/Urinary Tract: No masses identified. No evidence of ureteral calculi or hydronephrosis. Stomach/Bowel: Patient has undergone right colectomy and right lower quadrant ileostomy since prior study. Surgical drain is seen in the right abdomen. Nasogastric tube is seen within the stomach. No evidence of bowel obstruction or abscess. Vascular/Lymphatic: Right iliopsoas surgical drain is seen place. Persistent lobulated soft tissue density in the right retroperitoneum along the psoas and iliacus muscles, at site of previous percutaneous drainage catheter. This shows no significant change since prior study. No new or enlarging soft tissue masses are identified. No lymphadenopathy identified. Reproductive:  No mass or other significant abnormality. Other:  None. Musculoskeletal:  No suspicious bone lesions identified. IMPRESSION: Interval right colectomy and right lower quadrant ileostomy. No evidence of abscess or bowel obstruction. Stable right retroperitoneal soft tissue density along the psoas and iliacus muscles, at site of previous percutaneous drainage catheter. Metastatic disease cannot be excluded. Mild progression of liver metastases since prior study. Electronically Signed   By: Marlaine Hind M.D.   On: 12/09/2020 13:49     LOS:  25 days   Antonieta Pert, MD [facility]  12/10/2020, 11:00 AM

## 2020-12-10 NOTE — Progress Notes (Addendum)
HEMATOLOGY-ONCOLOGY PROGRESS NOTE  SUBJECTIVE: The patient reports that he is feeling well today.  He was advanced to clear liquids today.  He has tolerated this well so far with mild abdominal discomfort but no nausea or vomiting.  He reports liquid stool in his ostomy.  TPN is still infusing with plans to wean once he is tolerating p.o. well.  PHYSICAL EXAMINATION:  Vitals:   12/09/20 2035 12/10/20 0328  BP: (!) 142/76 118/75  Pulse: 72 67  Resp: 18 18  Temp: 98 F (36.7 C)   SpO2: 100% 99%   Filed Weights   11/15/20 2000  Weight: 69.4 kg    Intake/Output from previous day: 02/28 0701 - 03/01 0700 In: 1513.6 [I.V.:1313.6; IV Piggyback:200] Out: 2860 [Urine:1375; Emesis/NG output:800; Drains:60; Stool:625]  GENERAL:alert, no distress and comfortable LUNGS: clear to auscultation and percussion with normal breathing effort HEART: regular rate & rhythm and no murmurs and no lower extremity edema ABDOMEN: Positive bowel sounds, nontender, right lower quadrant ostomy pink with liquid stool in the ostomy bag.  Midline wound with superficial open areas NEURO: alert & oriented x 3 with fluent speech, no focal motor/sensory deficits  LABORATORY DATA:  I have reviewed the data as listed CMP Latest Ref Rng & Units 12/09/2020 12/08/2020 12/07/2020  Glucose 70 - 99 mg/dL 135(H) 148(H) 122(H)  BUN 6 - 20 mg/dL 31(H) 27(H) 23(H)  Creatinine 0.61 - 1.24 mg/dL 0.63 0.61 0.65  Sodium 135 - 145 mmol/L 134(L) 132(L) 130(L)  Potassium 3.5 - 5.1 mmol/L 4.0 4.3 4.5  Chloride 98 - 111 mmol/L 99 96(L) 97(L)  CO2 22 - 32 mmol/L _0 Calcium 8.9 - 10.3 mg/dL 9.0 9.2 9.4  Total Protein 6.5 - 8.1 g/dL 6.4(L) - -  Total Bilirubin 0.3 - 1.2 mg/dL 0.5 - -  Alkaline Phos 38 - 126 U/L 279(H) - -  AST 15 - 41 U/L 20 - -  ALT 0 - 44 U/L 35 - -    Lab Results  Component Value Date   WBC 9.5 12/09/2020   HGB 9.3 (L) 12/09/2020   HCT 31.2 (L) 12/09/2020   MCV 82.3 12/09/2020   PLT 667 (H)  12/09/2020   NEUTROABS 6.2 12/09/2020    DG Abd 1 View  Result Date: 12/01/2020 CLINICAL DATA:  Evaluate NG tube EXAM: ABDOMEN - 1 VIEW COMPARISON:  December 01, 2020 FINDINGS: The NG tube is been reposition in the interval now terminates in the stomach. IMPRESSION: The NG tube has been reposition in the interval and now terminates in the stomach. Electronically Signed   By: Dorise Bullion III M.D   On: 12/01/2020 12:43   DG Abd 1 View  Result Date: 12/01/2020 CLINICAL DATA:  Evaluate NG tube placement EXAM: ABDOMEN - 1 VIEW COMPARISON:  12/01/2020 FINDINGS: The air is a right arm PICC line with tip noted in the projection of the cavoatrial junction. The nasogastric tube is looped within the distal half of the esophagus with tip and side port above the GE junction and directed cranially. Dilated loop of bowel is noted in the left abdomen. There is a large stool burden overlying the right lower quadrant of the abdomen. IMPRESSION: 1. The nasogastric tube is looped within the distal half of the esophagus with tip and side port above the GE junction. 2. Dilated loop of bowel in the left abdomen. 3. Large stool burden overlying the right lower quadrant of the abdomen. Electronically Signed   By: Queen Slough.D.  On: 12/01/2020 12:37   DG Abd 1 View  Result Date: 12/01/2020 CLINICAL DATA:  NG tube placement. EXAM: ABDOMEN - 1 VIEW COMPARISON:  CT AP 11/25/2020 FINDINGS: Right arm PICC line tip is at the cavoatrial junction. The nasogastric tube is looped within the distal half of the esophagus. The distal end of the NG tube is oriented cranially with tip at the level of the carina. IMPRESSION: The nasogastric tube is looped within the distal half of the esophagus. The distal end of the NG tube is oriented cranially with tip at the level of the carina. Advise repositioning. Electronically Signed   By: Kerby Moors M.D.   On: 12/01/2020 12:29   CT Angio Chest PE W and/or Wo Contrast  Result Date:  11/15/2020 CLINICAL DATA:  Positive D-dimer, COVID-19 positive, tobacco abuse, right lower quadrant abdominal pain EXAM: CT ANGIOGRAPHY CHEST CT ABDOMEN AND PELVIS WITH CONTRAST TECHNIQUE: Multidetector CT imaging of the chest was performed using the standard protocol during bolus administration of intravenous contrast. Multiplanar CT image reconstructions and MIPs were obtained to evaluate the vascular anatomy. Multidetector CT imaging of the abdomen and pelvis was performed using the standard protocol during bolus administration of intravenous contrast. CONTRAST:  38m OMNIPAQUE IOHEXOL 350 MG/ML SOLN COMPARISON:  11/15/2020 FINDINGS: CTA CHEST FINDINGS Cardiovascular: This is a technically adequate evaluation of the pulmonary vasculature. No filling defects or pulmonary emboli. The heart is unremarkable without pericardial effusion. Mild atherosclerosis within the LAD distribution of the coronary vasculature. Normal caliber of the thoracic aorta without evidence of dissection. Mediastinum/Nodes: Mild wall thickening of the distal thoracic esophagus is nonspecific and could reflect esophagitis. Thyroid and trachea are unremarkable. No pathologic adenopathy. Lungs/Pleura: No acute airspace disease, effusion, or pneumothorax. Mild upper lobe predominant emphysema with areas of subpleural scarring. Central airways are patent. Musculoskeletal: No acute or destructive bony lesions. Reconstructed images demonstrate no additional findings. Review of the MIP images confirms the above findings. CT ABDOMEN and PELVIS FINDINGS Hepatobiliary: There is an indeterminate hypodensity within the inferior aspect right lobe liver, measuring approximately 3.5 x 2.5 cm reference image 36/4. No other focal liver abnormalities. The gallbladder is unremarkable. No intrahepatic duct dilation. Pancreas: Unremarkable. No pancreatic ductal dilatation or surrounding inflammatory changes. Spleen: Normal in size without focal abnormality.  Adrenals/Urinary Tract: Adrenal glands are unremarkable. Kidneys are normal, without renal calculi, focal lesion, or hydronephrosis. Bladder is unremarkable. Stomach/Bowel: There is masslike wall thickening involving the cecum, extending approximately 9 cm in length. There is evidence of cecal perforation posteriorly, with a complex multiloculated retroperitoneal fluid collection. There are intramuscular components of the fluid collection within the right psoas and iliacus muscles, with a fluid collection extending into the proximal aspect of the iliopsoas tendon. This collection measures approximately 9.1 x 6.7 cm in transverse dimension, and extends approximately 18 cm in craniocaudal length. Differential diagnosis includes neoplasm or severe colitis. A normal gas-filled appendix is seen in the right lower quadrant. Vascular/Lymphatic: No discrete adenopathy within the abdomen or pelvis. Moderate atherosclerosis of the aorta. Reproductive: Prostate is unremarkable. Other: There is no free intraperitoneal fluid or free gas. No abdominal wall hernia. Musculoskeletal: No acute or destructive bony lesions. Reconstructed images demonstrate no additional findings. Review of the MIP images confirms the above findings. IMPRESSION: 1. Long segment masslike mural thickening of the cecum, concerning for neoplasm. 2. Posterior perforation at the site of cecal wall thickening, with multilocular right retroperitoneal abscess which extends into the right psoas and right iliacus muscles as above. 3.  Indeterminate hypodensity inferior right lobe liver. Dedicated nonemergent outpatient liver MRI could be performed for definitive characterization. 4. No evidence of pulmonary embolus. 5. Mild wall thickening of the distal thoracic esophagus, which could reflect mild esophagitis. 6. Aortic Atherosclerosis (ICD10-I70.0) and Emphysema (ICD10-J43.9). Critical Value/emergent results were called by telephone at the time of interpretation  on 11/15/2020 at 4:16 pm to provider [place], who verbally acknowledged these results. Electronically Signed   By: Randa Ngo M.D.   On: 11/15/2020 16:22   MR PELVIS W WO CONTRAST  Result Date: 11/20/2020 CLINICAL DATA:  History of pelvic abscess. 3.5 cm liver lesion on CT. Scrotal pain. EXAM: MRI ABDOMEN AND PELVIS WITHOUT AND WITH CONTRAST TECHNIQUE: Multiplanar multisequence MR imaging of the abdomen and pelvis was performed both before and after the administration of intravenous contrast. CONTRAST:  64m GADAVIST GADOBUTROL 1 MMOL/ML IV SOLN COMPARISON:  CT scan 11/15/2020. FINDINGS: COMBINED FINDINGS FOR BOTH MR ABDOMEN AND PELVIS Lower chest: Unremarkable. Hepatobiliary: As noted on recent CT scan, there is a 3.5 x 2.5 cm heterogeneous lesion in the inferior right liver peripherally. This shows irregular peripheral and heterogeneous central enhancement after IV contrast administration. T2 imaging shows a surrounding halo of edema. No substantial restricted diffusion. A similar lesion is identified in the medial subcapsular right liver (segment VI) measuring 1.9 x 2.1 cm (see postcontrast image 48 of series 26). 1 additional irregular rim enhancing lesion is seen in the inferior left liver (segment IV B) visible on image 59 of series 26. 17 mm simple cyst identified lateral segment left liver. Several additional 3-4 mm hypoenhancing foci are seen scattered in the liver parenchyma, much too small to characterize but likely benign and may be tiny cysts or biliary hamartomas. There is no evidence for gallstones, gallbladder wall thickening, or pericholecystic fluid. Probable adenomyomatosis at the fundus. No intrahepatic or extrahepatic biliary dilation. Pancreas: No focal mass lesion. No dilatation of the main duct. No intraparenchymal cyst. No peripancreatic edema. Spleen:  No splenomegaly. No focal mass lesion. Adrenals/Urinary Tract: No adrenal nodule or mass. Kidneys unremarkable. No hydroureter.  Bladder unremarkable. Stomach/Bowel: Stomach is unremarkable. No gastric wall thickening. No evidence of outlet obstruction. Duodenum is normally positioned as is the ligament of Treitz. Mild diffuse distention of small bowel evident. Marked wall thickening again noted in the cecum. Colon is diffusely distended and stool-filled as before. Vascular/Lymphatic: No abdominal aortic aneurysm There is no gastrohepatic or hepatoduodenal ligament lymphadenopathy. No retroperitoneal or mesenteric lymphadenopathy. No pelvic sidewall lymphadenopathy. Reproductive: The prostate gland and seminal vesicles are unremarkable. Although not a dedicated scrotal exam, there is no evidence for testicular mass lesion or hydrocele. Other:  No substantial intraperitoneal free fluid. Musculoskeletal: Right iliopsoas abscess again noted with percutaneous drainage catheter now in place with distal loop formed in the psoas component of the process. Interval decrease in size of the rim enhancing fluid collection seen extending down anterior to the right hip as before. No suspicious focal marrow abnormality. IMPRESSION: 1. 3.5 x 2.5 cm heterogeneous enhancing lesion in the inferior peripheral right liver with 2 additional smaller lesions having similar imaging features. Given the large right retroperitoneal abscess, intrahepatic abscesses would be a distinct consideration. Metastatic disease also a consideration. Tissue sampling likely warranted. 2. Interval decrease in size of the rim enhancing right iliopsoas fluid collection status post percutaneous drain placement. 3. Marked wall thickening again noted in the cecum with diffuse distention of small bowel loops. These findings are better characterized on the recent CT scan.  Electronically Signed   By: Misty Stanley M.D.   On: 11/20/2020 05:53   MR ABDOMEN W WO CONTRAST  Result Date: 11/20/2020 CLINICAL DATA:  History of pelvic abscess. 3.5 cm liver lesion on CT. Scrotal pain. EXAM: MRI  ABDOMEN AND PELVIS WITHOUT AND WITH CONTRAST TECHNIQUE: Multiplanar multisequence MR imaging of the abdomen and pelvis was performed both before and after the administration of intravenous contrast. CONTRAST:  83m GADAVIST GADOBUTROL 1 MMOL/ML IV SOLN COMPARISON:  CT scan 11/15/2020. FINDINGS: COMBINED FINDINGS FOR BOTH MR ABDOMEN AND PELVIS Lower chest: Unremarkable. Hepatobiliary: As noted on recent CT scan, there is a 3.5 x 2.5 cm heterogeneous lesion in the inferior right liver peripherally. This shows irregular peripheral and heterogeneous central enhancement after IV contrast administration. T2 imaging shows a surrounding halo of edema. No substantial restricted diffusion. A similar lesion is identified in the medial subcapsular right liver (segment VI) measuring 1.9 x 2.1 cm (see postcontrast image 48 of series 26). 1 additional irregular rim enhancing lesion is seen in the inferior left liver (segment IV B) visible on image 59 of series 26. 17 mm simple cyst identified lateral segment left liver. Several additional 3-4 mm hypoenhancing foci are seen scattered in the liver parenchyma, much too small to characterize but likely benign and may be tiny cysts or biliary hamartomas. There is no evidence for gallstones, gallbladder wall thickening, or pericholecystic fluid. Probable adenomyomatosis at the fundus. No intrahepatic or extrahepatic biliary dilation. Pancreas: No focal mass lesion. No dilatation of the main duct. No intraparenchymal cyst. No peripancreatic edema. Spleen:  No splenomegaly. No focal mass lesion. Adrenals/Urinary Tract: No adrenal nodule or mass. Kidneys unremarkable. No hydroureter. Bladder unremarkable. Stomach/Bowel: Stomach is unremarkable. No gastric wall thickening. No evidence of outlet obstruction. Duodenum is normally positioned as is the ligament of Treitz. Mild diffuse distention of small bowel evident. Marked wall thickening again noted in the cecum. Colon is diffusely distended  and stool-filled as before. Vascular/Lymphatic: No abdominal aortic aneurysm There is no gastrohepatic or hepatoduodenal ligament lymphadenopathy. No retroperitoneal or mesenteric lymphadenopathy. No pelvic sidewall lymphadenopathy. Reproductive: The prostate gland and seminal vesicles are unremarkable. Although not a dedicated scrotal exam, there is no evidence for testicular mass lesion or hydrocele. Other:  No substantial intraperitoneal free fluid. Musculoskeletal: Right iliopsoas abscess again noted with percutaneous drainage catheter now in place with distal loop formed in the psoas component of the process. Interval decrease in size of the rim enhancing fluid collection seen extending down anterior to the right hip as before. No suspicious focal marrow abnormality. IMPRESSION: 1. 3.5 x 2.5 cm heterogeneous enhancing lesion in the inferior peripheral right liver with 2 additional smaller lesions having similar imaging features. Given the large right retroperitoneal abscess, intrahepatic abscesses would be a distinct consideration. Metastatic disease also a consideration. Tissue sampling likely warranted. 2. Interval decrease in size of the rim enhancing right iliopsoas fluid collection status post percutaneous drain placement. 3. Marked wall thickening again noted in the cecum with diffuse distention of small bowel loops. These findings are better characterized on the recent CT scan. Electronically Signed   By: EMisty StanleyM.D.   On: 11/20/2020 05:53   CT ABDOMEN PELVIS W CONTRAST  Result Date: 12/09/2020 CLINICAL DATA:  Postop abdominal pain and distension. Fever. Perforated colon carcinoma. EXAM: CT ABDOMEN AND PELVIS WITH CONTRAST TECHNIQUE: Multidetector CT imaging of the abdomen and pelvis was performed using the standard protocol following bolus administration of intravenous contrast. CONTRAST:  10107mOMNIPAQUE IOHEXOL  300 MG/ML  SOLN COMPARISON:  11/25/2020 FINDINGS: Lower Chest: No acute  findings. Hepatobiliary: Several hypovascular hepatic masses are seen, some which are new or increased since previous study. Dominant lesion in anterior segment the right lobe measures 4.3 x 2.3 cm on image 32/3, compared to 3.4 x 2.2 cm previously. A hypovascular mass in the right lobe along posterior margin the gallbladder fossa currently measures 2.7 x 2.1 cm on image 29/3, compared to 2.3 x 1.8 cm previously. Two small hypovascular masses adjacent to the gallbladder fossa are also slightly increased in size since previous study. These are consistent with progressive liver metastases. Small cyst in the lateral segment of the left lobe is stable. Gallbladder is unremarkable. No evidence of biliary ductal dilatation. Pancreas:  No mass or inflammatory changes. Spleen: Within normal limits in size and appearance. Adrenals/Urinary Tract: No masses identified. No evidence of ureteral calculi or hydronephrosis. Stomach/Bowel: Patient has undergone right colectomy and right lower quadrant ileostomy since prior study. Surgical drain is seen in the right abdomen. Nasogastric tube is seen within the stomach. No evidence of bowel obstruction or abscess. Vascular/Lymphatic: Right iliopsoas surgical drain is seen place. Persistent lobulated soft tissue density in the right retroperitoneum along the psoas and iliacus muscles, at site of previous percutaneous drainage catheter. This shows no significant change since prior study. No new or enlarging soft tissue masses are identified. No lymphadenopathy identified. Reproductive:  No mass or other significant abnormality. Other:  None. Musculoskeletal:  No suspicious bone lesions identified. IMPRESSION: Interval right colectomy and right lower quadrant ileostomy. No evidence of abscess or bowel obstruction. Stable right retroperitoneal soft tissue density along the psoas and iliacus muscles, at site of previous percutaneous drainage catheter. Metastatic disease cannot be excluded.  Mild progression of liver metastases since prior study. Electronically Signed   By: Marlaine Hind M.D.   On: 12/09/2020 13:49   CT ABDOMEN PELVIS W CONTRAST  Result Date: 11/26/2020 CLINICAL DATA:  Abdominal pain, abscess suspected, history of colon cancer EXAM: CT ABDOMEN AND PELVIS WITH CONTRAST TECHNIQUE: Multidetector CT imaging of the abdomen and pelvis was performed using the standard protocol following bolus administration of intravenous contrast. CONTRAST:  186m OMNIPAQUE IOHEXOL 300 MG/ML  SOLN COMPARISON:  11/15/2020 FINDINGS: Lower chest: No acute abnormality. Hepatobiliary: Redemonstrated ill-defined hypodense lesion of the subcapsular inferior right lobe of the liver, hepatic segment VI, measuring 3.6 x 2.4 cm (series 10, image 29). No gallstones, gallbladder wall thickening, or biliary dilatation. Pancreas: Unremarkable. No pancreatic ductal dilatation or surrounding inflammatory changes. Spleen: Normal in size without significant abnormality. Adrenals/Urinary Tract: Adrenal glands are unremarkable. Kidneys are normal, without renal calculi, solid lesion, or hydronephrosis. Bladder is unremarkable. Stomach/Bowel: Stomach is within normal limits. The terminal small bowel is distended, measuring up to 5.0 cm in caliber. 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 Vascular/Lymphatic: Aortic atherosclerosis. Enlarged lymph nodes in the right lower quadrant mesocolon measuring up to 1.5 x 1.1 cm (series 10, image 46). Reproductive: No mass or other significant abnormality. Other: No abdominal wall hernia or abnormality. There has been interval placement of a right lower quadrant percutaneous pigtail drainage catheter within a retroperitoneal fluid collection, now almost completely resolved with no discretely visualized fluid remaining (series 10, image 42, 51).  Trace ascites in the pelvis. Musculoskeletal: No acute or significant osseous findings. IMPRESSION: 1. There has  been interval placement of a right lower quadrant percutaneous pigtail drainage catheter within a retroperitoneal fluid collection, now almost completely resolved with no discretely visualized fluid remaining. 2. There is a redemonstrated large, fungating mass of the cecal base centered around the ileocecal valve, measuring at least 11.9 x 9.1 x 8.4 cm. Enlarged lymph nodes in the right lower quadrant mesocolon measuring up to 1.5 x 1.1 cm. 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 3. The terminal small bowel is distended, measuring up to 5.0 cm in caliber. Findings are consistent with at partial small bowel obstruction due to obstipating mass. 4. Redemonstrated ill-defined hypodense lesion of the subcapsular inferior right lobe of the liver, hepatic segment VI, which has been biopsied to evaluate for metastatic disease. 5. Trace ascites in the pelvis. Aortic Atherosclerosis (ICD10-I70.0). Electronically Signed   By: Eddie Candle M.D.   On: 11/26/2020 08:33   CT ABDOMEN PELVIS W CONTRAST  Result Date: 11/15/2020 CLINICAL DATA:  Positive D-dimer, COVID-19 positive, tobacco abuse, right lower quadrant abdominal pain EXAM: CT ANGIOGRAPHY CHEST CT ABDOMEN AND PELVIS WITH CONTRAST TECHNIQUE: Multidetector CT imaging of the chest was performed using the standard protocol during bolus administration of intravenous contrast. Multiplanar CT image reconstructions and MIPs were obtained to evaluate the vascular anatomy. Multidetector CT imaging of the abdomen and pelvis was performed using the standard protocol during bolus administration of intravenous contrast. CONTRAST:  51m OMNIPAQUE IOHEXOL 350 MG/ML SOLN COMPARISON:  11/15/2020 FINDINGS: CTA CHEST FINDINGS Cardiovascular: This is a technically adequate evaluation of the pulmonary vasculature. No  filling defects or pulmonary emboli. The heart is unremarkable without pericardial effusion. Mild atherosclerosis within the LAD distribution of the coronary vasculature. Normal caliber of the thoracic aorta without evidence of dissection. Mediastinum/Nodes: Mild wall thickening of the distal thoracic esophagus is nonspecific and could reflect esophagitis. Thyroid and trachea are unremarkable. No pathologic adenopathy. Lungs/Pleura: No acute airspace disease, effusion, or pneumothorax. Mild upper lobe predominant emphysema with areas of subpleural scarring. Central airways are patent. Musculoskeletal: No acute or destructive bony lesions. Reconstructed images demonstrate no additional findings. Review of the MIP images confirms the above findings. CT ABDOMEN and PELVIS FINDINGS Hepatobiliary: There is an indeterminate hypodensity within the inferior aspect right lobe liver, measuring approximately 3.5 x 2.5 cm reference image 36/4. No other focal liver abnormalities. The gallbladder is unremarkable. No intrahepatic duct dilation. Pancreas: Unremarkable. No pancreatic ductal dilatation or surrounding inflammatory changes. Spleen: Normal in size without focal abnormality. Adrenals/Urinary Tract: Adrenal glands are unremarkable. Kidneys are normal, without renal calculi, focal lesion, or hydronephrosis. Bladder is unremarkable. Stomach/Bowel: There is masslike wall thickening involving the cecum, extending approximately 9 cm in length. There is evidence of cecal perforation posteriorly, with a complex multiloculated retroperitoneal fluid collection. There are intramuscular components of the fluid collection within the right psoas and iliacus muscles, with a fluid collection extending into the proximal aspect of the iliopsoas tendon. This collection measures approximately 9.1 x 6.7 cm in transverse dimension, and extends approximately 18 cm in craniocaudal length. Differential diagnosis includes neoplasm or severe  colitis. A normal gas-filled appendix is seen in the right lower quadrant. Vascular/Lymphatic: No discrete adenopathy within the abdomen or pelvis. Moderate atherosclerosis of the aorta. Reproductive: Prostate is unremarkable. Other: There is no free intraperitoneal fluid or free gas. No abdominal wall hernia. Musculoskeletal: No acute or destructive bony lesions. Reconstructed images demonstrate no additional findings. Review of the MIP images confirms the above findings. IMPRESSION: 1. Long  segment masslike mural thickening of the cecum, concerning for neoplasm. 2. Posterior perforation at the site of cecal wall thickening, with multilocular right retroperitoneal abscess which extends into the right psoas and right iliacus muscles as above. 3. Indeterminate hypodensity inferior right lobe liver. Dedicated nonemergent outpatient liver MRI could be performed for definitive characterization. 4. No evidence of pulmonary embolus. 5. Mild wall thickening of the distal thoracic esophagus, which could reflect mild esophagitis. 6. Aortic Atherosclerosis (ICD10-I70.0) and Emphysema (ICD10-J43.9). Critical Value/emergent results were called by telephone at the time of interpretation on 11/15/2020 at 4:16 pm to provider [place], who verbally acknowledged these results. Electronically Signed   By: Randa Ngo M.D.   On: 11/15/2020 16:22   US BIOPSY (LIVER)  Result Date: 11/20/2020 INDICATION: 50 year old gentleman with right liver mass/abscess presents to interventional radiology for ultrasound-guided biopsy EXAM: Ultrasound-guided biopsy of right liver lesion MEDICATIONS: None. ANESTHESIA/SEDATION: Moderate (conscious) sedation was employed during this procedure. A total of Versed 1 mg and Fentanyl 25 mcg was administered intravenously. Moderate Sedation Time: 15 minutes. The patient's level of consciousness and vital signs were monitored continuously by radiology nursing throughout the procedure under my direct  supervision. COMPLICATIONS: None immediate. PROCEDURE: Informed written consent was obtained from the patient after a thorough discussion of the procedural risks, benefits and alternatives. All questions were addressed. Maximal Sterile Barrier Technique was utilized including caps, mask, sterile gowns, sterile gloves, sterile drape, hand hygiene and skin antiseptic. A timeout was performed prior to the initiation of the procedure. Patient positioned supine on the ultrasound table. Right upper quadrant skin prepped and draped in the usual fashion. Following local lidocaine administration, 17 gauge introducer needle was advanced into the right hepatic lobe, and five 18 gauge core were obtained utilizing continuous ultrasound guidance. One core sent in sterile saline for Gram stain and culture in the remaining sent in formalin for histologic analysis. Needle removed and hemostasis achieved with 5 minutes of manual compression. IMPRESSION: Ultrasound-guided biopsy of right liver lesion with sample sent for both pathology and Gram stain and culture. Electronically Signed   By: Miachel Roux M.D.   On: 11/20/2020 16:28   DG Chest Portable 1 View  Result Date: 11/15/2020 CLINICAL DATA:  COVID EXAM: PORTABLE CHEST 1 VIEW COMPARISON:  None. FINDINGS: Evaluation is limited secondary to patient rotation. The cardiomediastinal silhouette is normal in contour. Blunting of the RIGHT costophrenic angle. No pneumothorax. No acute pleuroparenchymal abnormality. Visualized abdomen is unremarkable. No acute osseous abnormality noted. IMPRESSION: Blunting of the RIGHT costophrenic angle may reflect a small pleural effusion. Electronically Signed   By: Valentino Saxon MD   On: 11/15/2020 13:27   DG Abd 2 Views  Result Date: 12/07/2020 CLINICAL DATA:  Ileus. EXAM: ABDOMEN - 2 VIEW COMPARISON:  December 01, 2020. FINDINGS: Severely dilated small bowel loops are noted with air-fluid levels concerning for distal small bowel  obstruction. No colonic dilatation is noted. Surgical drain is noted in right lower quadrant. Midline surgical staples are noted. IMPRESSION: Severely dilated small bowel loops are noted with air-fluid levels concerning for distal small bowel obstruction. Electronically Signed   By: Marijo Conception M.D.   On: 12/07/2020 19:19   DG Abd Portable 1V  Result Date: 12/07/2020 CLINICAL DATA:  NG tube placement EXAM: PORTABLE ABDOMEN - 1 VIEW COMPARISON:  None. FINDINGS: The bowel gas pattern is normal. Tip the NG tube is seen within the gastric fundus. A right-sided PICC is seen with the tip in the lower SVC.  No radio-opaque calculi or other significant radiographic abnormality are seen. IMPRESSION: Tip the NG tube within the gastric fundus. Electronically Signed   By: Prudencio Pair M.D.   On: 12/07/2020 20:54   CT IMAGE GUIDED DRAINAGE BY PERCUTANEOUS CATHETER  Result Date: 11/19/2020 INDICATION: 50 year old male with history of cecal perforation with fluid collection in the right retroperitoneum. Suspected underlying neoplastic etiology. EXAM: CT IMAGE GUIDED DRAINAGE BY PERCUTANEOUS CATHETER COMPARISON:  CT abdomen pelvis from 11/15/2020 MEDICATIONS: The patient is currently admitted to the hospital and receiving intravenous antibiotics. The antibiotics were administered within an appropriate time frame prior to the initiation of the procedure. ANESTHESIA/SEDATION: Moderate (conscious) sedation was employed during this procedure. A total of Versed 1 mg and Fentanyl 25 mcg was administered intravenously. Moderate Sedation Time: 21 minutes. The patient's level of consciousness and vital signs were monitored continuously by radiology nursing throughout the procedure under my direct supervision. CONTRAST:  None COMPLICATIONS: None immediate. PROCEDURE: Informed written consent was obtained from the patient after a discussion of the risks, benefits and alternatives to treatment. The patient was placed supine on the CT  gantry and a pre procedural CT was performed re-demonstrating the known abscess/fluid collection within the right retroperitoneum. The procedure was planned. A timeout was performed prior to the initiation of the procedure. The right lower quadrant was prepped and draped in the usual sterile fashion. The overlying soft tissues were anesthetized with 1% lidocaine with epinephrine. Appropriate trajectory was planned with the use of a 22 gauge spinal needle. An 18 gauge trocar needle was advanced into the abscess/fluid collection and a short Amplatz super stiff wire was coiled within the collection. Appropriate positioning was confirmed with a limited CT scan. The tract was serially dilated allowing placement of a 14 French all-purpose drainage catheter. Appropriate positioning was confirmed with a limited postprocedural CT scan. Approximately 400 mL ml of purulent fluid was aspirated. The tube was connected to a bulb suction and sutured in place. A dressing was placed. The patient tolerated the procedure well without immediate post procedural complication. IMPRESSION: Successful CT guided placement of a 70 French all purpose drain catheter into the right retroperitoneum with aspiration of approximately 400 mL of purulent fluid. Samples were sent to the laboratory for culture as well as cytology given suspicion for underlying neoplastic etiology. Ruthann Cancer, MD [facility] Electronically Signed   By: Ruthann Cancer MD   On: 11/19/2020 08:01   [place] EKG SITE RITE  Result Date: 11/18/2020 If Site Rite image not attached, placement could not be confirmed due to current cardiac rhythm.   ASSESSMENT AND PLAN: 1.  Stage IV adenocarcinoma of the colon -11/15/2020 CT abdomen/pelvis with contrast-long segment masslike mural thickening of the cecum, posterior perforation at the site of the cecal wall thickening with multilocular right retroperitoneal abscess,  indeterminate hypodensity in the inferior right lobe of the liver. -11/19/2020 CEA 0.8 -11/20/2020 MRI of the abdomen and pelvis with and without contrast-3.5 x 2.5 cm heterogeneous enhancing lesion in the inferior peripheral right liver with 2 smaller lesions with similar features. -11/20/2020 ultrasound-guided liver biopsy consistent with adenocarcinoma. -11/25/2020 CT abdomen/pelvis with contrast-interval placement of a right lower quadrant percutaneous pigtail drainage catheter with near resolution of the retroperitoneal fluid collection, redemonstrated large fungating mass at the cecal base measuring 11.9 x 9.1 x 8.4 cm with enlarged lymph nodes in the right lower quadrant mesocolon. -11/28/2020 exploratory laparotomy, right colectomy, ileostomy creation, cystoscopy with stent placement  -Surgical pathology  showed adenocarcinoma, moderately differentiated, 12 cm, 0/14+ lymph nodes, lymphovascular space invasion present, radial margin involved by invasive adenocarcinoma, appendix with intraluminal  adenocarcinoma, pT4, PN 0 -12/09/2020 CT abdomen/pelvis with contrast-interval right colectomy and right lower quadrant ileostomy, stable right retroperitoneal soft tissue density along the psoas and iliacus muscles, mild progression of liver metastases since prior study. 2.  Sepsis secondary to perforated colon 3.  Retroperitoneal abscess 4.  Protein calorie malnutrition 5.  Thrombocytosis 6.  Iron deficiency anemia 7.  COVID-19 infection, 11/15/2020 8.  Tobacco abuse  9.  Family history of colon cancer  Mr. Vannote appears improved.  He is recovering well from surgery.  Diet is slowly being advanced and his abdominal pain is well controlled.  Discussed the surgical pathology as well as imaging findings with the patient.  We plan to begin chemotherapy with FOLFOX as an outpatient.  We will hold on Avastin due to recent surgery.  Discussed adverse effects of this treatment with the patient including alopecia,  myelosuppression, peripheral neuropathy, nausea and vomiting, mucositis. May also consider him for hepatic directed therapy at some point in the future.  He will need a Port-A-Cath placed prior to chemotherapy.  Surgery aware and planning to place this later this week.  Recommendations: 1.  Port-A-Cath placement by general surgery prior to discharge. 2.  Advance diet and wean TPN per general surgery.  3.  Outpatient follow-up with the Cancer center 12/18/2020 4.  Follow-up results of Foundation 1 and MSI testing   LOS: 25 days   Mikey Bussing, DNP, AGPCNP-BC, AOCNP 12/10/20 Mr. Dondero was interviewed and examined.  He continues to recover from surgery.  I have been checking on him every few days.  The ileostomy is functioning.  I reviewed the operative note and surgical pathology report.  Mr. Etchison has been diagnosed with metastatic colon cancer.  No therapy will be curative.  I discussed treatment options with him again today.  I recommend initial treatment with FOLFOX chemotherapy when he has recovered from surgery.  We are waiting on results of MSI and Foundation 1 testing.  These results will dictate subsequent treatment options.  I reviewed potential toxicities associated with the FOLFOX regimen including the chance of nausea/vomiting, mucositis, diarrhea, alopecia, and hematologic toxicity.  We discussed the sun sensitivity, rash, hyperpigmentation, and hand/foot syndrome associated with 5-fluorouracil.  We discussed the allergic reaction and various types of neuropathy seen with oxaliplatin.  He agrees to proceed.  He will attend a chemotherapy teaching class.  Outpatient follow-up is scheduled at the Cancer center for 12/18/2020.  He will begin chemotherapy the following week if the abdominal wound has healed and he is tolerating the ileostomy.  I was present for greater than 50% of today's visit.  I performed medical decision making.

## 2020-12-10 NOTE — Progress Notes (Signed)
Physical Therapy Treatment Patient Details Name: Eric [name] MRN: [id number] DOB: July 08, 1971 Today's Date: 12/10/2020    History of Present Illness Pt is a 50 y.o. male admitted 11/15/20 with R groin pain; incidental (+) COVID-19. 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 S/p percutaneous drain placement 2/7. Liver lesion biopsy 2/9. PMH includes tobacco use. S/p exploratory laparotomy, colectomy end ostomy placement and cystoscopy with stent.    PT Comments    Pt supine in bed on arrival this session.  Pt eager to ambulate.  Focused on gt for the first time without the RW.  He tolerated this well.  Will plan to review HEP next session.  Encouraged patient to ambulate multiple times daily with staff or his wife.     Follow Up Recommendations  No PT follow up     Equipment Recommendations  None recommended by PT    Recommendations for Other Services       Precautions / Restrictions Precautions Precautions: Fall;Other (comment) Precaution Comments: JP drain and Rt ostomy pouch Restrictions Weight Bearing Restrictions: No    Mobility  Bed Mobility Overal bed mobility: Needs Assistance Bed Mobility: Rolling;Sidelying to Sit;Sit to Sidelying Rolling: Modified independent (Device/Increase time) Sidelying to sit: Modified independent (Device/Increase time);HOB elevated     Sit to sidelying: Modified independent (Device/Increase time);HOB elevated General bed mobility comments: No assist needed, use of rail.    Transfers   Equipment used: None Transfers: Sit to/from Stand Sit to Stand: Modified independent (Device/Increase time)            Ambulation/Gait Ambulation/Gait assistance: Supervision Gait Distance (Feet): 1000 Feet Assistive device: None Gait Pattern/deviations: Step-through pattern;Narrow base of support;Trunk flexed Gait velocity: Decreased   General Gait Details: Performed with no  device this session.  Pt required cues for upper trunk control and reciprocal armswing.  Presents with R toe turned in.  Reports," I am pigeon toed."   Stairs             Wheelchair Mobility    Modified Rankin (Stroke Patients Only)       Balance Overall balance assessment: Needs assistance Sitting-balance support: Feet supported;No upper extremity supported Sitting balance-Leahy Scale: Good       Standing balance-Leahy Scale: Fair                              Cognition Arousal/Alertness: Awake/alert Behavior During Therapy: WFL for tasks assessed/performed Overall Cognitive Status: Within Functional Limits for tasks assessed                                        Exercises      General Comments        Pertinent Vitals/Pain Pain Assessment: Faces Faces Pain Scale: Hurts a little bit Pain Location: abdominal pain post op Pain Descriptors / Indicators: Guarding Pain Intervention(s): Monitored during session;Repositioned    Home Living                      Prior Function            PT Goals (current goals can now be found in the care plan section) Acute Rehab PT Goals Potential to Achieve Goals: Good Progress towards PT goals: Progressing toward goals    Frequency    Min 3X/week  PT Plan Current plan remains appropriate    Co-evaluation              AM-PAC PT "6 Clicks" Mobility   Outcome Measure  Help needed turning from your back to your side while in a flat bed without using bedrails?: None Help needed moving from lying on your back to sitting on the side of a flat bed without using bedrails?: None Help needed moving to and from a bed to a chair (including a wheelchair)?: None Help needed standing up from a chair using your arms (e.g., wheelchair or bedside chair)?: None Help needed to walk in hospital room?: A Little Help needed climbing 3-5 steps with a railing? : A Little 6 Click Score:  22    End of Session   Activity Tolerance: Patient tolerated treatment well Patient left: in bed;with call bell/phone within reach Nurse Communication: Mobility status PT Visit Diagnosis: Muscle weakness (generalized) (M62.81);Difficulty in walking, not elsewhere classified (R26.2) Pain - Right/Left:  (incisional)     Time: 0981-1914 PT Time Calculation (min) (ACUTE ONLY): 11 min  Charges:  $Gait Training: 8-22 mins                     Bonney Leitz , PTA Acute Rehabilitation Services Pager [phone] Office [phone]     Eric [name] Artis Delay 12/10/2020, 2:15 PM

## 2020-12-10 NOTE — TOC Progression Note (Addendum)
Transition of Care [facility]) - Progression Note    Patient Details  Name: Eric [name] MRN: [id number] Date of Birth: Mar 17, 1971  [facility] of [facility]) CM/SW Contact  Cabria Micalizzi, Edson Snowball, RN Phone Number: 12/10/2020, 12:37 PM  Clinical Narrative:     [place] reviewing referral for [facility], await determination.  Ramond Marrow with [facility] called back, patient and roommate do not qualify for charity home health RN   Expected Discharge Plan: [place] Barriers to Discharge: Continued Medical Work up  Expected Discharge Plan and Services Expected Discharge Plan: [place]   Discharge Planning Services: CM Consult Post Acute Care Choice: [place] arrangements for the past 2 months: Single Family Home                           HH Arranged: RN           Social Determinants of Health (SDOH) Interventions    Readmission Risk Interventions No flowsheet data found.

## 2020-12-11 ENCOUNTER — Telehealth: Payer: Self-pay

## 2020-12-11 LAB — GLUCOSE, CAPILLARY
Glucose-Capillary: 122 mg/dL — ABNORMAL HIGH (ref 70–99)
Glucose-Capillary: 111 mg/dL — ABNORMAL HIGH (ref 70–99)

## 2020-12-11 MED ORDER — TRAVASOL 10 % IV SOLN
INTRAVENOUS | Status: AC
  Filled 2020-12-11: qty 604.8

## 2020-12-11 MED ORDER — PANTOPRAZOLE SODIUM 40 MG PO TBEC
40.0000 mg | DELAYED_RELEASE_TABLET | Freq: Every day | ORAL | Status: DC
  Administered 2020-12-13 – 2020-12-15 (×3): 40 mg via ORAL
  Filled 2020-12-11 (×3): qty 1

## 2020-12-11 MED ORDER — PANTOPRAZOLE SODIUM 40 MG PO TBEC
40.0000 mg | DELAYED_RELEASE_TABLET | Freq: Two times a day (BID) | ORAL | Status: AC
  Administered 2020-12-11 – 2020-12-12 (×4): 40 mg via ORAL
  Filled 2020-12-11 (×3): qty 1

## 2020-12-11 NOTE — Progress Notes (Signed)
PROGRESS NOTE    Eric [name]  HYQ:[id number] DOB: 1970/12/14 DOA: 11/15/2020 PCP: [facility]   Brief Narrative:  50 year old male with no known PMH presented with right groin pain found to have retroperitoneal abscess with bowel perforation cecal mass,incidental Covid positive seen by surgery placed on TPN for severe protein calorie malnutrition and had IR drain the abscess.Patient underwent right hemicolectomy with ileostomy 2/17. Post-op course complicated by ileus, NGT was placed 2/20.  Patient is being managed with IV antibiotics leukocytosis much improved, remains on TPN with plan to wean off TPN as ileus improves. 2/22-attempted NG clamping but he vomited.  Antibiotic Zosyn completed 2/22. 2/25-diet started 2/26 patient feeling nauseous also vomited, x-ray abdomen showed severely dilated small bowel loops concerning for distal small bowel obstruction-NG tube was placed back in.  Assessment & Plan:  Perforated Stage IV colon cancer with RTP abscess s/p right colectomy/ileostomy 11/28/20, s/p cystoscopy and ureteral stent, with liver mets -Complicated with postop ileus: -POD #13 -Did not tolerate soft diet 2/25-NG tube reinserted 2/26, now off NG tube. diet is being advanced to clear liquid diet & then to soft diet today.  Wean off TPN. - Repeat CT abdomen 2/28-no evidence of abscess or bowel obstruction, stable right retroperitoneal soft tissue density along the psoas and iliac [place] muscle, metastatic disease cannot be excluded, mild progression of liver mets -Continue as needed pain medication -Oncology and general surgery on board-appreciate help.  Oncology to start chemo once he is recovered from the surgery. -N.p.o. after midnight for Port-A-Cath placement tomorrow  Sepsis POA 2/2 perforated colon mass: -Sepsis resolved. -Completed abx Zosyn- from 2/4 to 2/22. -culture had Enterobacter errogeneous. Wbc normal.  Remained afebrile.  Drain removed on  3/1.  Blood loss anemia/microcytic anemia  -with hemoglobin 5.4 g on admission 2/2 chronic GI bleed from colon cancer. -S/P PRBC x4 units -Monitor H&H closely and transfuse if hemoglobin less than 7.  Deconditionind-ambulating well.  Moderate protein calorie malnutrition: Augment diet/TPN as above.  Thrombocytosis likely reactive. Downtrending.  Hyponatremia: -Improving.  Sodium 134 on 2/28  Hyperglycemic-likely reactive. A1c is normal.  COVID-19 virus infection positive incidental/asymptomatic s/px3 days remdesivir,off isolation 2/15.  Tobacco abuse: Counseled to quit -Nicotine patch   DVT prophylaxis: Heparin  code Status: Full code Family Communication:  None present at bedside.  Plan of care discussed with patient in length and he verbalized understanding and agreed with it. Disposition Plan: To be determined  Consultants:   General surgery  Oncology  Procedures:  See note Antimicrobials:   See note  Status is: Inpatient   Dispo: The patient is from: Home              Anticipated d/c is to: Home              Patient currently is not medically stable to d/c.   Difficult to place patient No         Subjective: Patient seen and examined.  Sitting comfortably on the bed and eating breakfast.  No new complaints.  Remained afebrile.  Denies nausea, vomiting, pain is well controlled.  No acute events overnight.  Objective: Vitals:   12/10/20 0328 12/10/20 1413 12/10/20 1955 12/11/20 0315  BP: 118/75 115/82 116/73 110/71  Pulse: 67 77 67 68  Resp: 18 16 18 17   Temp:  97.9 F (36.6 C) 98 F (36.7 C) 98.2 F (36.8 C)  TempSrc:  Oral Oral Oral  SpO2: 99% 100% 100% 100%  Weight:  Height:        Intake/Output Summary (Last 24 hours) at 12/11/2020 1259 Last data filed at 12/11/2020 1012 Gross per 24 hour  Intake 2220.88 ml  Output 3675 ml  Net -1454.12 ml   Filed Weights   11/15/20 2000  Weight: 69.4 kg    Examination:  General exam:  Appears calm and comfortable, on room air, communicating well, eating breakfast, appears chronically ill Respiratory system: Clear to auscultation. Respiratory effort normal. Cardiovascular system: S1 & S2 heard, RRR. No JVD, murmurs, rubs, gallops or clicks. No pedal edema. Gastrointestinal system: Abdomen is nondistended, soft and nontender. Normal bowel sounds heard.  Ileostomy present with yellowish stool in the bag.  Dressing dry and intact.  No bleeding or discharge seen. Central nervous system: Alert and oriented. No focal neurological deficits. Extremities: Symmetric 5 x 5 power. Skin: No rashes, lesions or ulcers Psychiatry: Judgement and insight appear normal. Mood & affect appropriate.    Data Reviewed: I have personally reviewed following labs and imaging studies  CBC: Recent Labs  Lab 12/08/20 0333 12/09/20 0311  WBC 13.4* 9.5  NEUTROABS  --  6.2  HGB 9.5* 9.3*  HCT 31.5* 31.2*  MCV 79.5* 82.3  PLT 690* 667*   Basic Metabolic Panel: Recent Labs  Lab 12/05/20 0406 12/06/20 0353 12/07/20 1145 12/08/20 0333 12/09/20 0311  NA 134* 131* 130* 132* 134*  K 4.2 4.3 4.5 4.3 4.0  CL 104 100 97* 96* 99  CO2 21* 21* 22 26 25   GLUCOSE 131* 121* 122* 148* 135*  BUN 25* 21* 23* 27* 31*  CREATININE 0.54* 0.50* 0.65 0.61 0.63  CALCIUM 8.8* 8.7* 9.4 9.2 9.0  MG 2.0  --   --  1.9 2.2  PHOS 3.6  --   --   --  4.5   GFR: Estimated Creatinine Clearance: 107.2 mL/min (by C-G formula based on SCr of 0.63 mg/dL). Liver Function Tests: Recent Labs  Lab 12/05/20 0406 12/09/20 0311  AST 25 20  ALT 42 35  ALKPHOS 149* 279*  BILITOT 0.8 0.5  PROT 6.1* 6.4*  ALBUMIN 2.3* 2.6*   No results for input(s): LIPASE, AMYLASE in the last 168 hours. No results for input(s): AMMONIA in the last 168 hours. Coagulation Profile: No results for input(s): INR, PROTIME in the last 168 hours. Cardiac Enzymes: No results for input(s): CKTOTAL, CKMB, CKMBINDEX, TROPONINI in the last 168  hours. BNP (last 3 results) No results for input(s): PROBNP in the last 8760 hours. HbA1C: No results for input(s): HGBA1C in the last 72 hours. CBG: Recent Labs  Lab 12/10/20 0534 12/10/20 1134 12/10/20 1748 12/10/20 1958 12/11/20 1009  GLUCAP 128* 113* 116* 102* 122*   Lipid Profile: Recent Labs    12/09/20 0311  TRIG 85   Thyroid Function Tests: No results for input(s): TSH, T4TOTAL, FREET4, T3FREE, THYROIDAB in the last 72 hours. Anemia Panel: No results for input(s): VITAMINB12, FOLATE, FERRITIN, TIBC, IRON, RETICCTPCT in the last 72 hours. Sepsis Labs: No results for input(s): PROCALCITON, LATICACIDVEN in the last 168 hours.  No results found for this or any previous visit (from the past 240 hour(s)).    Radiology Studies: No results found.  Scheduled Meds: . acetaminophen  1,000 mg Oral Q6H  . Chlorhexidine Gluconate Cloth  6 each Topical Daily  . heparin injection (subcutaneous)  5,000 Units Subcutaneous Q8H  . influenza vac split quadrivalent PF  0.5 mL Intramuscular Tomorrow-1000  . lidocaine  1 patch Transdermal Daily  . lip  balm  1 application Topical BID  . nicotine  21 mg Transdermal Daily  . pantoprazole  40 mg Oral BID   Followed by  . [START ON 12/13/2020] pantoprazole  40 mg Oral Daily  . sodium chloride flush  5 mL Intracatheter Q8H   Continuous Infusions: . methocarbamol (ROBAXIN) IV 1,000 mg (12/11/20 1149)  . ondansetron [facility]) IV 8 mg (12/01/20 0152)  . TPN ADULT (ION) 60 mL/hr at 12/11/20 1000     LOS: 26 days   Time spent: 35 minutes   Kaena Santori Estill Cotta, MD [facility]  If 7PM-7AM, please contact night-coverage [email or web address] 12/11/2020, 12:59 PM

## 2020-12-11 NOTE — Progress Notes (Addendum)
13 Days Post-Op  Subjective: CC: Doing well. Having some soreness around this midline wound but otherwise denies any abdominal pain. Tolerating cld without n/v. Ileostomy functioning with air and liquid stool in the bag. Patient reports he is mobilizing and voiding without difficulty.   Per notes, patients sister Lennie Odor, has changed the colostomy bag and is going to help at home. Patient worked with WOCN and patient demonstrated how to open/close, clean, a sample pouch; and, he burped the existing pouch.  Objective: Vital signs in last 24 hours: Temp:  [97.9 F (36.6 C)-98.2 F (36.8 C)] 98.2 F (36.8 C) (03/02 0315) Pulse Rate:  [67-77] 68 (03/02 0315) Resp:  [16-18] 17 (03/02 0315) BP: (110-116)/(71-82) 110/71 (03/02 0315) SpO2:  [100 %] 100 % (03/02 0315) Last BM Date: 12/10/20  Intake/Output from previous day: 03/01 0701 - 03/02 0700 In: 2230.9 [P.O.:1240; I.V.:990.9] Out: 3805 [Urine:2625; Drains:30; VZCHY:8502] Intake/Output this shift: No intake/output data recorded.  PE: Gen: alert, cooperative NAD Heart: Reg rate Lungs: Normal rate and effort  Abd: soft, NT, ND, +BS, ileostomy in R hemiabdomen - edematous and viable - air and liquid stool in pouch.1,150cc/24 hours. Midline wound with intermittent staples removed. No drainage or signs of infection. No evisceration.  ileostomy -1150/24 hours JP - now removed Msk: No LE edema.   Lab Results:  Recent Labs    12/09/20 0311  WBC 9.5  HGB 9.3*  HCT 31.2*  PLT 667*   BMET Recent Labs    12/09/20 0311  NA 134*  K 4.0  CL 99  CO2 25  GLUCOSE 135*  BUN 31*  CREATININE 0.63  CALCIUM 9.0   PT/INR No results for input(s): LABPROT, INR in the last 72 hours. CMP     Component Value Date/Time   NA 134 (L) 12/09/2020 0311   K 4.0 12/09/2020 0311   CL 99 12/09/2020 0311   CO2 25 12/09/2020 0311   GLUCOSE 135 (H) 12/09/2020 0311   BUN 31 (H) 12/09/2020 0311   CREATININE  0.63 12/09/2020 0311   CALCIUM 9.0 12/09/2020 0311   PROT 6.4 (L) 12/09/2020 0311   ALBUMIN 2.6 (L) 12/09/2020 0311   AST 20 12/09/2020 0311   ALT 35 12/09/2020 0311   ALKPHOS 279 (H) 12/09/2020 0311   BILITOT 0.5 12/09/2020 0311   GFRNONAA >60 12/09/2020 0311   Lipase  No results found for: LIPASE     Studies/Results: CT ABDOMEN PELVIS W CONTRAST  Result Date: 12/09/2020 CLINICAL DATA:  Postop abdominal pain and distension. Fever. Perforated colon carcinoma. EXAM: CT ABDOMEN AND PELVIS WITH CONTRAST TECHNIQUE: Multidetector CT imaging of the abdomen and pelvis was performed using the standard protocol following bolus administration of intravenous contrast. CONTRAST:  170mL OMNIPAQUE IOHEXOL 300 MG/ML  SOLN COMPARISON:  11/25/2020 FINDINGS: Lower Chest: No acute findings. Hepatobiliary: Several hypovascular hepatic masses are seen, some which are new or increased since previous study. Dominant lesion in anterior segment the right lobe measures 4.3 x 2.3 cm on image 32/3, compared to 3.4 x 2.2 cm previously. A hypovascular mass in the right lobe along posterior margin the gallbladder fossa currently measures 2.7 x 2.1 cm on image 29/3, compared to 2.3 x 1.8 cm previously. Two small hypovascular masses adjacent to the gallbladder fossa are also slightly increased in size since previous study. These are consistent with progressive liver metastases. Small cyst in the lateral segment of the left lobe is stable. Gallbladder is unremarkable. No evidence of biliary ductal dilatation. Pancreas:  No mass or inflammatory changes. Spleen: Within normal limits in size and appearance. Adrenals/Urinary Tract: No masses identified. No evidence of ureteral calculi or hydronephrosis. Stomach/Bowel: Patient has undergone right colectomy and right lower quadrant ileostomy since prior study. Surgical drain is seen in the right abdomen. Nasogastric tube is seen within the stomach. No evidence of bowel obstruction or  abscess. Vascular/Lymphatic: Right iliopsoas surgical drain is seen place. Persistent lobulated soft tissue density in the right retroperitoneum along the psoas and iliacus muscles, at site of previous percutaneous drainage catheter. This shows no significant change since prior study. No new or enlarging soft tissue masses are identified. No lymphadenopathy identified. Reproductive:  No mass or other significant abnormality. Other:  None. Musculoskeletal:  No suspicious bone lesions identified. IMPRESSION: Interval right colectomy and right lower quadrant ileostomy. No evidence of abscess or bowel obstruction. Stable right retroperitoneal soft tissue density along the psoas and iliacus muscles, at site of previous percutaneous drainage catheter. Metastatic disease cannot be excluded. Mild progression of liver metastases since prior study. Electronically Signed   By: Marlaine Hind M.D.   On: 12/09/2020 13:49    Anti-infectives: Anti-infectives (From admission, onward)   Start     Dose/Rate Route Frequency Ordered Stop   11/30/20 1045  ceFAZolin (ANCEF) IVPB 2g/100 mL premix  Status:  Discontinued        2 g 200 mL/hr over 30 Minutes Intravenous On call to O.R. 11/30/20 0957 11/30/20 1043   11/16/20 1000  remdesivir 100 mg in sodium chloride 0.9 % 100 mL IVPB       "Followed by" Linked Group Details   100 mg 200 mL/hr over 30 Minutes Intravenous Daily 11/15/20 2135 11/17/20 1002   11/15/20 2230  piperacillin-tazobactam (ZOSYN) IVPB 3.375 g        3.375 g 12.5 mL/hr over 240 Minutes Intravenous Every 8 hours 11/15/20 2144 12/03/20 2359   11/15/20 2134  remdesivir 200 mg in sodium chloride 0.9% 250 mL IVPB       "Followed by" Linked Group Details   200 mg 580 mL/hr over 30 Minutes Intravenous Once 11/15/20 2135 11/16/20 0029   11/15/20 1700  piperacillin-tazobactam (ZOSYN) IVPB 3.375 g        3.375 g 100 mL/hr over 30 Minutes Intravenous  Once 11/15/20 1657 11/15/20 1826        Assessment/Plan Anemia Hyponatremia Tobacco abuse COVID-19positive - off isolation 2/15 Severe malnutrition - prealbumin 5.8 (2/8), now 33.1. TPN   Colonmass with perforationand RTP abscess Stage IV adenocarcinomacolon with liver mets S/P EXPLORATORY LAPAROTOMY, RIGHT COLECTOMY, END ILEOSTOMY CREATION, CYSTOSCOPY W/ STENT PLACEMENT 11/28/20(Dr. Rosendo Gros; Dr. Abner Greenspan) - POD#13 - AFVSS. - s/p IR drain placement 2/7 - this drain was removed intra-operatively and new blake drain placed; IR cx Enterobacter aerogenes - IV Zosyn completed 2/22 at [facility]  - Drain removed 3/1 - Advance to soft diet today. 1/2 TPN  - OOB/IS  - Will plan for port-a-cath placement tomorrow. - Monitor ileostomy output. Labs in the AM.   ID -zosyn 2/4-2/22 FEN -1/2 TPN, soft diet. NPO at midnight VTE: SCDs, SQ heparin Foley -D/C-ed 2/18. Voiding.  Dispo - inpatient   LOS: 26 days    Jillyn Ledger , [facility] 12/11/2020, 7:59 AM Please see Amion for pager number during day hours 7:00am-4:30pm

## 2020-12-11 NOTE — Plan of Care (Signed)
  Problem: Activity: Goal: Risk for activity intolerance will decrease Outcome: Progressing   Problem: Coping: Goal: Level of anxiety will decrease Outcome: Progressing   Problem: Elimination: Goal: Will not experience complications related to bowel motility Outcome: Progressing   

## 2020-12-11 NOTE — Progress Notes (Signed)
PHARMACY - TOTAL PARENTERAL NUTRITION CONSULT NOTE   Indication: Prolonged ileus  Patient Measurements: Height: 6' (182.9 cm) Weight: 69.4 kg (153 lb) IBW/kg (Calculated) : 77.6 TPN AdjBW (KG): 69.4 Body mass index is 20.75 kg/m. Usual Weight: 170 lbs  Assessment: Eric [name] is a 50 y.o. male no known medical history. Patient presented secondary to right groin pain and found to have a retroperitoneal abscess/bowel perforation/cecal mass concerning for neoplasm. Incidental finding of COVID-19 positive status.  Patient is  s/p colon resection on 2/17. On 2/19 he vomited 500 ml green emesis and vomited again 2/20. Noted to have worsening ileus. NGT placed. Pharmacy consulted to resume TPN for prolonged ileus.   Glucose / Insulin: No history of diabetes. A1c 5.2. CBGs 110-150. Used 0 units SSI/24h.  Electrolytes: last labs 2/28: K 4 (goal >=4), Corrected Ca 10.2, Na 134. all others wnl Renal: last labs 2/28: SCr stable at 0.6, BUN 31 uptrending LFTs / TGs: LFTs and Tbili WNL, alk phos 279 up, TG 85.  Prealbumin / albumin: Prealbumin 33.1 up (2/28). Albumin 2.6 (2/28) Intake / Output; MIVF: 521m IVF;  UOP 0.8 ml/kg/hr. NGT re-inserted 2/26 with 800 cc output over 12 hrs (none charted for evening shift); noted ileostomy output high on 2/24 at 2L, down to 625 cc last 24 hrs; R JP drain 60 ml/24 hrs  GI Imaging:  2/4 CT abd: long segment masslike thickening on cecum concerning for neoplasm, R retroperitoneal abscess 2/9 MRI abd: lesion in peripheral R liver - possible intrahepatic abscess vs. Metastatic disease. Dec fluid in R iliopsoas.  2/26 DG abd: Severely dilated small bowel loops are noted with air-fluid levels concerning for distal small bowel obstruction. 2/28 CT abd: Interval right colectomy and right lower quadrant ileostomy. No evidence of abscess or bowel obstruction  Surgeries / Procedures:  2/7: R retroperitoneal drain placed, replaced in OR 2/17 2/17: ex-lap, right colectomy,  end ileostomy, cystoscopy with stent placement  Central access: Triple lumen PICC 11/18/20 TPN start date: 11/18/20-11/24/20; resumed 2/20  Nutritional Goals (per RD recommendation on 2/16): Kcal:  2200-2430; Protein:  110-125; Fluid:  >/= 2.2 L Goal TPN rate is 100 mL/hr (provides 120 g of protein and an average of 2328 kcals per day - with 21 g/L lipids on MWF)  Current Nutrition:  TPN Advanced to CLD 3/1 - tolerated soup, mango ice, coffee, and gelatin, wil advance to soft diet 3/2   Plan:  Halve TPN per team to increase appetite, decrease to 60 cc/hr (will provide 60g AA, 230g dextrose, 1530 kcal providing ~60% estimated needs) 50g Lipids MWF due to national shortage  Electrolytes in TPN: Increase K to 50 mEq/L, Mg to 8 mEq/L, Phos 12 mmol/L (decreased amounts with rate decrease); Continue Na to 130 mEq/L, Ca 0 mEq/L, Cl:Ac 1:1 Add standard MVI and trace elements to TPN Stop SSI   F/u TPN labs Mon/Thurs Plan to wean off TPN by Friday  Additional IV fluid as needed per team    Thank you for allowing pharmacy to be a part of this patient's care.  LBenetta Spar PharmD, BCPS, BCCP Clinical Pharmacist  Please check AMION for all [place] numbers After 10:00 PM, call [phone]

## 2020-12-11 NOTE — Telephone Encounter (Signed)
Sent email to [facility] requesting MSI testing on colon biopsy  Case:  [id number]  Attn:  Landry Mellow and Hassie Bruce per Dr. Gearldine Shown request.

## 2020-12-11 NOTE — Plan of Care (Signed)
Soft diet today 3/2; NPO at midnight for chest port placement tomorrow 3/3. TPN rate change to 60 ml/hr per order.   Problem: Education: Goal: Knowledge of risk factors and measures for prevention of condition will improve Outcome: Progressing   Problem: Coping: Goal: Psychosocial and spiritual needs will be supported Outcome: Progressing   Problem: Respiratory: Goal: Will maintain a patent airway Outcome: Progressing Goal: Complications related to the disease process, condition or treatment will be avoided or minimized Outcome: Progressing   Problem: Education: Goal: Knowledge of General Education information will improve Description: Including pain rating scale, medication(s)/side effects and non-pharmacologic comfort measures Outcome: Progressing   Problem: Health Behavior/Discharge Planning: Goal: Ability to manage health-related needs will improve Outcome: Progressing   Problem: Clinical Measurements: Goal: Ability to maintain clinical measurements within normal limits will improve Outcome: Progressing Goal: Will remain free from infection Outcome: Progressing Goal: Diagnostic test results will improve Outcome: Progressing Goal: Respiratory complications will improve Outcome: Progressing Goal: Cardiovascular complication will be avoided Outcome: Progressing   Problem: Activity: Goal: Risk for activity intolerance will decrease Outcome: Progressing   Problem: Nutrition: Goal: Adequate nutrition will be maintained Outcome: Progressing   Problem: Coping: Goal: Level of anxiety will decrease Outcome: Progressing   Problem: Elimination: Goal: Will not experience complications related to bowel motility Outcome: Progressing Goal: Will not experience complications related to urinary retention Outcome: Progressing   Problem: Pain Managment: Goal: General experience of comfort will improve Outcome: Progressing   Problem: Safety: Goal: Ability to remain free  from injury will improve Outcome: Progressing   Problem: Skin Integrity: Goal: Risk for impaired skin integrity will decrease Outcome: Progressing

## 2020-12-11 NOTE — Progress Notes (Signed)
Physical Therapy Treatment Patient Details Name: Eric [name] MRN: [id number] DOB: 12/22/1970 Today's Date: 12/11/2020    History of Present Illness Pt is a 50 y.o. male admitted 11/15/20 with R groin pain; incidental (+) COVID-19. 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 S/p percutaneous drain placement 2/7. Liver lesion biopsy 2/9. PMH includes tobacco use. S/p exploratory laparotomy, colectomy end ostomy placement and cystoscopy with stent.    PT Comments    Pt in good spirits focused on gt training and review of HEP this session.  Pt educated to perform HEP daily when up to walk with staff or his sister.  Continue to recommend no PT follow up.     Follow Up Recommendations  No PT follow up     Equipment Recommendations  None recommended by PT    Recommendations for Other Services       Precautions / Restrictions Precautions Precautions: Fall;Other (comment) Precaution Comments: R ostomy pouch Restrictions Weight Bearing Restrictions: No    Mobility  Bed Mobility Overal bed mobility: Needs Assistance Bed Mobility: Sit to Sidelying;Rolling;Sidelying to Sit Rolling: Modified independent (Device/Increase time) Sidelying to sit: Modified independent (Device/Increase time);HOB elevated     Sit to sidelying: Modified independent (Device/Increase time);HOB elevated General bed mobility comments: No assist needed, use of rail.    Transfers Overall transfer level: Needs assistance Equipment used: None;Rolling walker (2 wheeled) (with and without device.) Transfers: Sit to/from Stand Sit to Stand: Modified independent (Device/Increase time)         General transfer comment: supervision for safety, no LOB noted. Stood from [employer].  Ambulation/Gait Ambulation/Gait assistance: Supervision Gait Distance (Feet): 1000 Feet Assistive device: None Gait Pattern/deviations: Step-through pattern;Narrow base  of support;Trunk flexed Gait velocity: Decreased   General Gait Details: Performed with no device this session.  Pt required cues for upper trunk control and reciprocal armswing.   Stairs             Wheelchair Mobility    Modified Rankin (Stroke Patients Only)       Balance Overall balance assessment: Needs assistance Sitting-balance support: Feet supported;No upper extremity supported Sitting balance-Leahy Scale: Good       Standing balance-Leahy Scale: Fair Standing balance comment: Prefers UE support esp for walking due to reported weakness                            Cognition Arousal/Alertness: Awake/alert Behavior During Therapy: WFL for tasks assessed/performed Overall Cognitive Status: Within Functional Limits for tasks assessed                                 General Comments: appears to be his baseline      Exercises General Exercises - Lower Extremity Hip ABduction/ADduction: AROM;Both;10 reps;Standing Hip Flexion/Marching: AROM;Both;10 reps;Standing Heel Raises: AROM;Both;10 reps;Standing Mini-Sqauts: AROM;Both;10 reps;Standing    General Comments        Pertinent Vitals/Pain Pain Assessment: Faces Faces Pain Scale: Hurts a little bit Pain Location: abdominal pain post op Pain Descriptors / Indicators: Guarding Pain Intervention(s): Monitored during session;Repositioned    Home Living                      Prior Function            PT Goals (current goals can now be found in the care plan  section) Acute Rehab PT Goals Patient Stated Goal: to walk without the walker Potential to Achieve Goals: Good Progress towards PT goals: Progressing toward goals    Frequency    Min 3X/week      PT Plan Current plan remains appropriate    Co-evaluation              AM-PAC PT "6 Clicks" Mobility   Outcome Measure  Help needed turning from your back to your side while in a flat bed without using  bedrails?: None Help needed moving from lying on your back to sitting on the side of a flat bed without using bedrails?: None Help needed moving to and from a bed to a chair (including a wheelchair)?: None Help needed standing up from a chair using your arms (e.g., wheelchair or bedside chair)?: None Help needed to walk in hospital room?: A Little Help needed climbing 3-5 steps with a railing? : A Little 6 Click Score: 22    End of Session Equipment Utilized During Treatment: Gait belt Activity Tolerance: Patient tolerated treatment well Patient left: in bed;with call bell/phone within reach Nurse Communication: Mobility status PT Visit Diagnosis: Muscle weakness (generalized) (M62.81);Difficulty in walking, not elsewhere classified (R26.2) Pain - Right/Left:  (incisional)     Time: 3086-5784 PT Time Calculation (min) (ACUTE ONLY): 16 min  Charges:  $Therapeutic Exercise: 8-22 mins                     Bonney Leitz , PTA Acute Rehabilitation Services Pager [phone] Office [phone]     Aleeha Boline Artis Delay 12/11/2020, 5:28 PM

## 2020-12-12 ENCOUNTER — Inpatient Hospital Stay (HOSPITAL_COMMUNITY): Payer: Self-pay | Admitting: Anesthesiology

## 2020-12-12 ENCOUNTER — Inpatient Hospital Stay (HOSPITAL_COMMUNITY): Payer: Self-pay

## 2020-12-12 ENCOUNTER — Encounter (HOSPITAL_COMMUNITY): Payer: Self-pay | Admitting: Internal Medicine

## 2020-12-12 ENCOUNTER — Encounter (HOSPITAL_COMMUNITY)
Admission: EM | Disposition: A | Discharge status: Home / Self Care | Payer: Self-pay | Source: Home / Self Care | Attending: Internal Medicine

## 2020-12-12 HISTORY — PX: PORTACATH PLACEMENT: SHX2246

## 2020-12-12 LAB — COMPREHENSIVE METABOLIC PANEL
ALT: 31 U/L (ref 0–44)
AST: 18 U/L (ref 15–41)
Albumin: 2.5 g/dL — ABNORMAL LOW (ref 3.5–5.0)
Alkaline Phosphatase: 231 U/L — ABNORMAL HIGH (ref 38–126)
Anion gap: 8 (ref 5–15)
BUN: 11 mg/dL (ref 6–20)
CO2: 24 mmol/L (ref 22–32)
Calcium: 9.1 mg/dL (ref 8.9–10.3)
Chloride: 101 mmol/L (ref 98–111)
Creatinine, Ser: 0.62 mg/dL (ref 0.61–1.24)
GFR, Estimated: >60 mL/min (ref >60)
Glucose, Bld: 101 mg/dL — ABNORMAL HIGH (ref 70–99)
Potassium: 4.4 mmol/L (ref 3.5–5.1)
Sodium: 133 mmol/L — ABNORMAL LOW (ref 135–145)
Total Bilirubin: 0.7 mg/dL (ref 0.3–1.2)
Total Protein: 6 g/dL — ABNORMAL LOW (ref 6.5–8.1)

## 2020-12-12 LAB — CBC
HCT: 28.3 % — ABNORMAL LOW (ref 39.0–52.0)
Hemoglobin: 8.4 g/dL — ABNORMAL LOW (ref 13.0–17.0)
MCH: 24.8 pg — ABNORMAL LOW (ref 26.0–34.0)
MCHC: 29.7 g/dL — ABNORMAL LOW (ref 30.0–36.0)
MCV: 83.5 fL (ref 80.0–100.0)
Platelets: 528 10*3/uL — ABNORMAL HIGH (ref 150–400)
RBC: 3.39 MIL/uL — ABNORMAL LOW (ref 4.22–5.81)
WBC: 6.2 10*3/uL (ref 4.0–10.5)
nRBC: 0 % (ref 0.0–0.2)

## 2020-12-12 LAB — PHOSPHORUS: Phosphorus: 5.3 mg/dL — ABNORMAL HIGH (ref 2.5–4.6)

## 2020-12-12 LAB — MAGNESIUM: Magnesium: 2 mg/dL (ref 1.7–2.4)

## 2020-12-12 SURGERY — INSERTION, TUNNELED CENTRAL VENOUS DEVICE, WITH PORT
Anesthesia: General | Site: Chest | Laterality: Right

## 2020-12-12 MED ORDER — METHOCARBAMOL 500 MG PO TABS
1000.0000 mg | ORAL_TABLET | Freq: Four times a day (QID) | ORAL | Status: DC | PRN
  Administered 2020-12-14 – 2020-12-15 (×2): 1000 mg via ORAL
  Filled 2020-12-12 (×2): qty 2

## 2020-12-12 MED ORDER — MIDAZOLAM HCL 2 MG/2ML IJ SOLN
INTRAMUSCULAR | Status: DC | PRN
  Administered 2020-12-12: 1 mg via INTRAVENOUS

## 2020-12-12 MED ORDER — FENTANYL CITRATE (PF) 250 MCG/5ML IJ SOLN
INTRAMUSCULAR | Status: DC | PRN
  Administered 2020-12-12 (×2): 50 ug via INTRAVENOUS

## 2020-12-12 MED ORDER — BUPIVACAINE-EPINEPHRINE (PF) 0.25% -1:200000 IJ SOLN
INTRAMUSCULAR | Status: AC
  Filled 2020-12-12: qty 20

## 2020-12-12 MED ORDER — ADULT MULTIVITAMIN W/MINERALS CH
1.0000 | ORAL_TABLET | Freq: Every day | ORAL | Status: DC
  Administered 2020-12-13 – 2020-12-15 (×3): 1 via ORAL
  Filled 2020-12-12 (×3): qty 1

## 2020-12-12 MED ORDER — CEFAZOLIN SODIUM-DEXTROSE 2-3 GM-%(50ML) IV SOLR
INTRAVENOUS | Status: DC | PRN
  Administered 2020-12-12: 2 g via INTRAVENOUS

## 2020-12-12 MED ORDER — PROPOFOL 10 MG/ML IV BOLUS
INTRAVENOUS | Status: DC | PRN
  Administered 2020-12-12: 150 mg via INTRAVENOUS

## 2020-12-12 MED ORDER — DEXAMETHASONE SODIUM PHOSPHATE 10 MG/ML IJ SOLN
INTRAMUSCULAR | Status: DC | PRN
  Administered 2020-12-12: 5 mg via INTRAVENOUS

## 2020-12-12 MED ORDER — OXYCODONE HCL 5 MG/5ML PO SOLN
5.0000 mg | Freq: Once | ORAL | Status: DC | PRN
Start: 2020-12-12 — End: 2020-12-12

## 2020-12-12 MED ORDER — SODIUM CHLORIDE 0.9 % IV SOLN
INTRAVENOUS | Status: AC
  Filled 2020-12-12: qty 1.2

## 2020-12-12 MED ORDER — HEPARIN SOD (PORK) LOCK FLUSH 100 UNIT/ML IV SOLN
INTRAVENOUS | Status: DC | PRN
  Administered 2020-12-12: 500 [IU]

## 2020-12-12 MED ORDER — HEPARIN SOD (PORK) LOCK FLUSH 100 UNIT/ML IV SOLN
INTRAVENOUS | Status: AC
  Filled 2020-12-12: qty 5

## 2020-12-12 MED ORDER — CALCIUM POLYCARBOPHIL 625 MG PO TABS
625.0000 mg | ORAL_TABLET | Freq: Every day | ORAL | Status: DC
  Administered 2020-12-12 – 2020-12-15 (×4): 625 mg via ORAL
  Filled 2020-12-12 (×4): qty 1

## 2020-12-12 MED ORDER — FENTANYL CITRATE (PF) 250 MCG/5ML IJ SOLN
INTRAMUSCULAR | Status: AC
  Filled 2020-12-12: qty 5

## 2020-12-12 MED ORDER — LIDOCAINE 2% (20 MG/ML) 5 ML SYRINGE
INTRAMUSCULAR | Status: DC | PRN
  Administered 2020-12-12: 50 mg via INTRAVENOUS

## 2020-12-12 MED ORDER — MIDAZOLAM HCL 2 MG/2ML IJ SOLN
INTRAMUSCULAR | Status: AC
  Filled 2020-12-12: qty 2

## 2020-12-12 MED ORDER — ONDANSETRON HCL 4 MG/2ML IJ SOLN
INTRAMUSCULAR | Status: DC | PRN
  Administered 2020-12-12: 4 mg via INTRAVENOUS

## 2020-12-12 MED ORDER — FENTANYL CITRATE (PF) 100 MCG/2ML IJ SOLN: 25.0000 ug | INTRAMUSCULAR | Status: DC | PRN

## 2020-12-12 MED ORDER — PROPOFOL 10 MG/ML IV BOLUS
INTRAVENOUS | Status: AC
  Filled 2020-12-12: qty 40

## 2020-12-12 MED ORDER — CHLORHEXIDINE GLUCONATE 0.12 % MT SOLN
OROMUCOSAL | Status: AC
  Administered 2020-12-12: 15 mL via OROMUCOSAL
  Filled 2020-12-12: qty 15

## 2020-12-12 MED ORDER — SODIUM CHLORIDE 0.9 % IV SOLN
INTRAVENOUS | Status: DC | PRN
  Administered 2020-12-12: 500 mL

## 2020-12-12 MED ORDER — 0.9 % SODIUM CHLORIDE (POUR BTL) OPTIME
TOPICAL | Status: DC | PRN
  Administered 2020-12-12: 1000 mL

## 2020-12-12 MED ORDER — OXYCODONE HCL 5 MG PO TABS: 5.0000 mg | ORAL_TABLET | Freq: Once | ORAL | Status: DC | PRN

## 2020-12-12 MED ORDER — CHLORHEXIDINE GLUCONATE 0.12 % MT SOLN: 15.0000 mL | Freq: Once | OROMUCOSAL | Status: AC

## 2020-12-12 MED ORDER — LACTATED RINGERS IV SOLN: INTRAVENOUS | Status: DC

## 2020-12-12 MED ORDER — ENSURE ENLIVE PO LIQD
237.0000 mL | Freq: Three times a day (TID) | ORAL | Status: DC
  Administered 2020-12-12 – 2020-12-15 (×5): 237 mL via ORAL

## 2020-12-12 MED ORDER — PROMETHAZINE HCL 25 MG/ML IJ SOLN
6.2500 mg | INTRAMUSCULAR | Status: DC | PRN
Start: 2020-12-12 — End: 2020-12-12

## 2020-12-12 MED ORDER — BUPIVACAINE-EPINEPHRINE 0.25% -1:200000 IJ SOLN
INTRAMUSCULAR | Status: DC | PRN
  Administered 2020-12-12: 8 mL

## 2020-12-12 SURGICAL SUPPLY — 42 items
ADH SKN CLS APL DERMABOND .7 (GAUZE/BANDAGES/DRESSINGS) ×1
APL PRP STRL LF DISP 70% ISPRP (MISCELLANEOUS) ×1
BAG DECANTER FOR FLEXI CONT (MISCELLANEOUS) ×2 IMPLANT
CHLORAPREP W/TINT 26 (MISCELLANEOUS) ×2 IMPLANT
COVER SURGICAL LIGHT HANDLE (MISCELLANEOUS) ×2 IMPLANT
COVER TRANSDUCER ULTRASND GEL (DISPOSABLE) IMPLANT
COVER WAND RF STERILE (DRAPES) ×2 IMPLANT
DERMABOND ADVANCED (GAUZE/BANDAGES/DRESSINGS) ×1
DERMABOND ADVANCED .7 DNX12 (GAUZE/BANDAGES/DRESSINGS) ×1 IMPLANT
DRAPE C-ARM 42X120 X-RAY (DRAPES) ×2 IMPLANT
DRAPE LAPAROSCOPIC ABDOMINAL (DRAPES) ×2 IMPLANT
ELECT CAUTERY BLADE 6.4 (BLADE) ×2 IMPLANT
ELECT REM PT RETURN 9FT ADLT (ELECTROSURGICAL) ×2
ELECTRODE REM PT RTRN 9FT ADLT (ELECTROSURGICAL) ×1 IMPLANT
GAUZE SPONGE 2X2 8PLY STRL LF (GAUZE/BANDAGES/DRESSINGS) ×1 IMPLANT
GEL ULTRASOUND 20GR AQUASONIC (MISCELLANEOUS) IMPLANT
GLOVE BIOGEL PI IND STRL 6 (GLOVE) ×1 IMPLANT
GLOVE BIOGEL PI INDICATOR 6 (GLOVE) ×1
GLOVE SURG SYN 5.5 (GLOVE) ×2 IMPLANT
GLOVE SURG SYN 5.5 PF PI (GLOVE) ×1 IMPLANT
GOWN STRL REUS W/ TWL LRG LVL3 (GOWN DISPOSABLE) ×2 IMPLANT
GOWN STRL REUS W/TWL LRG LVL3 (GOWN DISPOSABLE) ×4
INTRODUCER COOK 11FR (CATHETERS) IMPLANT
KIT BASIN OR (CUSTOM PROCEDURE TRAY) ×2 IMPLANT
KIT PORT POWER 8FR ISP CVUE (Port) ×1 IMPLANT
KIT TURNOVER KIT B (KITS) ×2 IMPLANT
NS IRRIG 1000ML POUR BTL (IV SOLUTION) ×2 IMPLANT
PAD ARMBOARD 7.5X6 YLW CONV (MISCELLANEOUS) ×2 IMPLANT
PENCIL BUTTON HOLSTER BLD 10FT (ELECTRODE) ×2 IMPLANT
POSITIONER HEAD DONUT 9IN (MISCELLANEOUS) ×2 IMPLANT
SET INTRODUCER 12FR PACEMAKER (INTRODUCER) IMPLANT
SET SHEATH INTRODUCER 10FR (MISCELLANEOUS) IMPLANT
SHEATH COOK PEEL AWAY SET 9F (SHEATH) IMPLANT
SPONGE GAUZE 2X2 STER 10/PKG (GAUZE/BANDAGES/DRESSINGS) ×1
SUT MNCRL AB 4-0 PS2 18 (SUTURE) ×2 IMPLANT
SUT PROLENE 2 0 SH DA (SUTURE) ×2 IMPLANT
SUT VIC AB 3-0 SH 27 (SUTURE) ×2
SUT VIC AB 3-0 SH 27X BRD (SUTURE) ×1 IMPLANT
SYR 5ML LUER SLIP (SYRINGE) ×2 IMPLANT
TOWEL GREEN STERILE (TOWEL DISPOSABLE) ×2 IMPLANT
TOWEL GREEN STERILE FF (TOWEL DISPOSABLE) ×2 IMPLANT
TRAY LAPAROSCOPIC MC (CUSTOM PROCEDURE TRAY) ×2 IMPLANT

## 2020-12-12 NOTE — Op Note (Signed)
Date: 12/12/20  Patient: Eric [name] MRN: [id number]  Preoperative Diagnosis: Metastatic colon cancer Postoperative Diagnosis: Same  Procedure: Portacath insertion  Surgeon: Michaelle Birks, MD  EBL: Minimal  Anesthesia: General LMA  Specimens: None  Indications: [name] is a 50 yo male who presented with abdominal pain and was found to have a large mass in the right colon, as well as liver metastases. He previously underwent an open right hemicolectomy with end ileostomy on 11/28/20, and has recovered from surgery. He will undergo adjuvant chemotherapy and port placement was requested prior to discharge from the hospital.  Findings: 8-Fr power port placed via the right subclavian vein under fluoroscopic guidance. Total catheter length of 15cm.  Procedure details: Informed consent was obtained in the preoperative area prior to the procedure. The patient was brought to the operating room and placed on the table in the supine position. General anesthesia was induced and appropriate lines and drains were placed for intraoperative monitoring. Perioperative antibiotics were administered per SCIP guidelines. The chest and neck were prepped and draped in the usual sterile fashion. A pre-procedure timeout was taken verifying patient identity, surgical site and procedure to be performed.  The patient was placed in Trendelenberg and the right subclavian vein was accessed with a large-bore needle. A guidewire was inserted and advanced, and position in the SVC was confirmed fluoroscopically. The needle was removed and the wire was clipped to the drapes to secure its position. Next an incision was made on the right upper chest wall around the wire, and a subcutaneous pocket was created. The port and catheter were then flushed and brought onto the field. Three 3-0 prolene sutures were used to secure the port in the subcutaneous pocket, but the sutures were not tied down. The port was placed in the pocket and  the catheter was then measured using fluoro - it was placed over the skin adjacent to the guidewire, and marked externally at the cavoatrial junction. The catheter was then cut at this location, which was at 15cm. The dilator and sheath were then advanced over the guidewire, and the wire and dilator were removed. The end of the catheter was inserted through the sheath and advanced, and the sheath was peeled away. The port was then accessed with a Huber needle, and blood was aspirated and the port was flushed with heparinized saline. A final fluoroscopic image confirmed appropriate position of the catheter tip within the SVC, without kinking of the catheter. The prolene sutures were tied down. A final flush of 500 units heparin (100 units/mL) was given via the port. The skin was closed with a deep layer of interrupted 3-0 Vicryl suture, followed by a running subcuticular 4-0 monocryl suture. Dermabond was applied.  The patient tolerated the procedure well with no apparent complications. All counts were correct x2 at the end of the procedure. The patient was extubated and taken to PACU in stable condition.  Michaelle Birks, MD 12/12/20  10:16 AM

## 2020-12-12 NOTE — Progress Notes (Signed)
PHARMACY - TOTAL PARENTERAL NUTRITION CONSULT NOTE   Indication: Prolonged ileus  Patient Measurements: Height: 6' (182.9 cm) Weight: 69.4 kg (153 lb) IBW/kg (Calculated) : 77.6 TPN AdjBW (KG): 69.4 Body mass index is 20.75 kg/m. Usual Weight: 170 lbs  Assessment: Eric [name] is a 50 y.o. male no known medical history. Patient presented secondary to right groin pain and found to have a retroperitoneal abscess/bowel perforation/cecal mass concerning for neoplasm. Incidental finding of COVID-19 positive status.  Patient is  s/p colon resection on 2/17. On 2/19 he vomited 500 ml green emesis and vomited again 2/20. Noted to have worsening ileus. NGT placed. Pharmacy consulted to resume TPN for prolonged ileus.   Glucose / Insulin: No history of diabetes. A1c 5.2. CBGs <130. Off SSI Electrolytes: Na 133, K 4.4 (goal >=4), Corrected Ca 10.3, phos 5.3; all others wnl Renal:  SCr stable at 0.6, BUN 11 LFTs / TGs: LFTs and Tbili WNL, alk phos 279 up, TG 85.  Prealbumin / albumin: Prealbumin 33.1 up (2/28). Albumin 2.6 (2/28) Intake / Output; MIVF: 1.5L intake;  UOP 1.5 ml/kg/hr. NGT re-inserted 2/26 no output charted; ileostomy output increased to 1279m last 24 hrs since eating more; drain removed 3/1 GI Imaging:  2/4 CT abd: long segment masslike thickening on cecum concerning for neoplasm, R retroperitoneal abscess 2/9 MRI abd: lesion in peripheral R liver - possible intrahepatic abscess vs. Metastatic disease. Dec fluid in R iliopsoas.  2/26 DG abd: Severely dilated small bowel loops are noted with air-fluid levels concerning for distal small bowel obstruction. 2/28 CT abd: Interval right colectomy and right lower quadrant ileostomy. No evidence of abscess or bowel obstruction  Surgeries / Procedures:  2/7: R retroperitoneal drain placed, replaced in OR 2/17 2/17: ex-lap, right colectomy, end ileostomy, cystoscopy with stent placement  Central access: Triple lumen PICC 11/18/20 TPN start  date: 11/18/20-11/24/20; resumed 2/20  Nutritional Goals (per RD recommendation on 2/16): Kcal:  2200-2430; Protein:  110-125; Fluid:  >/= 2.2 L Goal TPN rate is 100 mL/hr (provides 120 g of protein and an average of 2328 kcals per day - with 21 g/L lipids on MWF)  Current Nutrition:  TPN Advanced to CLD 3/1 - tolerated soup, mango ice, coffee, and gelatin, wil advance to soft diet 3/2 - eating well   Plan:  Stop TPN per team    Thank you for allowing pharmacy to be a part of this patient's care.  LBenetta Spar PharmD, BCPS, BCCP Clinical Pharmacist  Please check AMION for all [place] numbers After 10:00 PM, call [phone]

## 2020-12-12 NOTE — Anesthesia Postprocedure Evaluation (Signed)
Anesthesia Post Note  Patient: Eric [name]  Procedure(s) Performed: INSERTION PORT-A-CATH (Right Chest)     Patient location during evaluation: PACU Anesthesia Type: General Level of consciousness: awake and alert Pain management: pain level controlled Vital Signs Assessment: post-procedure vital signs reviewed and stable Respiratory status: spontaneous breathing, nonlabored ventilation and respiratory function stable Cardiovascular status: blood pressure returned to baseline and stable Postop Assessment: no apparent nausea or vomiting Anesthetic complications: no   No complications documented.  Last Vitals:  Vitals:   12/12/20 1106 12/12/20 1125  BP:  120/89  Pulse:  64  Resp:  14  Temp: 36.8 C (!) 36.3 C  SpO2: 96% 100%    Last Pain:  Vitals:   12/12/20 1125  TempSrc: Oral  PainSc:                  Audry Pili

## 2020-12-12 NOTE — Anesthesia Preprocedure Evaluation (Addendum)
Anesthesia Evaluation  Patient identified by MRN, date of birth, ID band Patient awake    Reviewed: Allergy & Precautions, NPO status , Patient's Chart, lab work & pertinent test results  History of Anesthesia Complications Negative for: history of anesthetic complications  Airway Mallampati: II  TM Distance: >3 FB Neck ROM: Full    Dental  (+) Dental Advisory Given, Poor Dentition, Missing   Pulmonary Current SmokerPatient did not abstain from smoking.,    Pulmonary exam normal        Cardiovascular negative cardio ROS Normal cardiovascular exam     Neuro/Psych negative neurological ROS  negative psych ROS   GI/Hepatic  Liver mass   Colon cancer    Endo/Other   Na 133   Renal/GU negative Renal ROS     Musculoskeletal negative musculoskeletal ROS (+)   Abdominal   Peds  Hematology  (+) anemia ,  Plt 528k    Anesthesia Other Findings Covid+ 11/15/20   Reproductive/Obstetrics                            Anesthesia Physical Anesthesia Plan  ASA: III  Anesthesia Plan: General   Post-op Pain Management:    Induction: Intravenous  PONV Risk Score and Plan: 2 and Treatment may vary due to age or medical condition, Ondansetron, Dexamethasone and Midazolam  Airway Management Planned: LMA  Additional Equipment: None  Intra-op Plan:   Post-operative Plan: Extubation in OR  Informed Consent: I have reviewed the patients History and Physical, chart, labs and discussed the procedure including the risks, benefits and alternatives for the proposed anesthesia with the patient or authorized representative who has indicated his/her understanding and acceptance.     Dental advisory given  Plan Discussed with: CRNA and Anesthesiologist  Anesthesia Plan Comments:        Anesthesia Quick Evaluation

## 2020-12-12 NOTE — Progress Notes (Signed)
PROGRESS NOTE    Eric [name]  VWU:[id number] DOB: 06/19/1971 DOA: 11/15/2020 PCP: [facility], [facility]   Brief Narrative:  50 year old male with no known PMH presented with right groin pain found to have retroperitoneal abscess with bowel perforation cecal mass,incidental Covid positive seen by surgery placed on TPN for severe protein calorie malnutrition and had IR drain the abscess.Patient underwent right hemicolectomy with ileostomy 2/17. Post-op course complicated by ileus, NGT was placed 2/20.  Patient is being managed with IV antibiotics leukocytosis much improved, remains on TPN with plan to wean off TPN as ileus improves. 2/22-attempted NG clamping but he vomited.  Antibiotic Zosyn completed 2/22. 2/25-diet started 2/26 patient feeling nauseous also vomited, x-ray abdomen showed severely dilated small bowel loops concerning for distal small bowel obstruction-NG tube was placed back in.  Assessment & Plan:  Perforated Stage IV colon cancer with RTP abscess s/p right colectomy/ileostomy 11/28/20, s/p cystoscopy and ureteral stent, with liver mets -Complicated with postop ileus: -POD #14 - Repeat CT abdomen 2/28-no evidence of abscess or bowel obstruction, stable right retroperitoneal soft tissue density along the psoas and iliac [place] muscle, metastatic disease cannot be excluded, mild progression of liver mets -Did not tolerate soft diet 2/25-NG tube reinserted 2/26, now off NG tube.  He is tolerating soft diet without any issues.  DC TPN.  Begin fiber diet and monitor ileostomy output -Continue as needed pain medication -Oncology and general surgery on board-appreciate help.  Oncology to start chemo once he is recovered from the surgery. -Underwent Port-A-Cath placement today 3/3  Sepsis POA 2/2 perforated colon mass: -Sepsis resolved. -Status post drain placement on 2/7-and removed on 3/1 -culture grew Enterobacter errogeneous. Wbc normal.  Remained afebrile.   -Completed abx Zosyn- from 2/4 to 2/22.  Blood loss anemia/microcytic anemia  -with hemoglobin 5.4 g on admission 2/2 chronic GI bleed from colon cancer. -S/P PRBC x4 units -Monitor H&H closely and transfuse if hemoglobin less than 7.  Deconditionind-ambulating well.  Moderate protein calorie malnutrition:  Advance diet.  DC TPN.  Thrombocytosis likely reactive. Downtrending.  Hyponatremia: -Improving.  Sodium 133 on 2/28  Hyperglycemic-likely reactive. A1c is normal.  COVID-19 virus infection positive incidental/asymptomatic s/px3 days remdesivir,off isolation 2/15.  Tobacco abuse: Counseled to quit -Nicotine patch  DVT prophylaxis: Heparin  code Status: Full code Family Communication:  None present at bedside.  Plan of care discussed with patient in length and he verbalized understanding and agreed with it. Disposition Plan: To be determined  Consultants:   General surgery  Oncology  Procedures:  See note Antimicrobials:   See note  Status is: Inpatient   Dispo: The patient is from: Home              Anticipated d/c is to: Home              Patient currently is not medically stable to d/c.   Difficult to place patient No         Subjective: Patient seen and examined.  Tolerated procedure very well without any complications.  No new complaints.  Tolerating soft diet very well without any issues.  Remained afebrile.  Denies nausea or vomiting.  No acute events overnight.  Objective: Vitals:   12/12/20 1036 12/12/20 1050 12/12/20 1106 12/12/20 1125  BP: 110/80 107/78  120/89  Pulse: 76 64  64  Resp: 14 11  14   Temp:   98.2 F (36.8 C) (!) 97.3 F (36.3 C)  TempSrc:    Oral  SpO2: 98% 98% 96% 100%  Weight:      Height:        Intake/Output Summary (Last 24 hours) at 12/12/2020 1349 Last data filed at 12/12/2020 1013 Gross per 24 hour  Intake 1420 ml  Output 2825 ml  Net -1405 ml   Filed Weights   11/15/20 2000 12/12/20 0808  Weight: 69.4  kg 69.4 kg    Examination:  General exam: Appears calm and comfortable, on room air, communicating well, eating breakfast, appears chronically ill Respiratory system: Clear to auscultation. Respiratory effort normal. Cardiovascular system: S1 & S2 heard, RRR. No JVD, murmurs, rubs, gallops or clicks. No pedal edema.  Port-A-Cath noted on right upper chest.  Sutures intact. Gastrointestinal system: Abdomen is nondistended, soft and nontender. Normal bowel sounds heard.  Ileostomy present with brownish-yellowish stool in the bag.  Dressing dry and intact.  No bleeding or discharge seen. Central nervous system: Alert and oriented. No focal neurological deficits. Extremities: Symmetric 5 x 5 power. Skin: No rashes, lesions or ulcers Psychiatry: Judgement and insight appear normal. Mood & affect appropriate.    Data Reviewed: I have personally reviewed following labs and imaging studies  CBC: Recent Labs  Lab 12/08/20 0333 12/09/20 0311 12/12/20 0325  WBC 13.4* 9.5 6.2  NEUTROABS  --  6.2  --   HGB 9.5* 9.3* 8.4*  HCT 31.5* 31.2* 28.3*  MCV 79.5* 82.3 83.5  PLT 690* 667* 528*   Basic Metabolic Panel: Recent Labs  Lab 12/06/20 0353 12/07/20 1145 12/08/20 0333 12/09/20 0311 12/12/20 0325  NA 131* 130* 132* 134* 133*  K 4.3 4.5 4.3 4.0 4.4  CL 100 97* 96* 99 101  CO2 21* 22 26 25 24   GLUCOSE 121* 122* 148* 135* 101*  BUN 21* 23* 27* 31* 11  CREATININE 0.50* 0.65 0.61 0.63 0.62  CALCIUM 8.7* 9.4 9.2 9.0 9.1  MG  --   --  1.9 2.2 2.0  PHOS  --   --   --  4.5 5.3*   GFR: Estimated Creatinine Clearance: 107.2 mL/min (by C-G formula based on SCr of 0.62 mg/dL). Liver Function Tests: Recent Labs  Lab 12/09/20 0311 12/12/20 0325  AST 20 18  ALT 35 31  ALKPHOS 279* 231*  BILITOT 0.5 0.7  PROT 6.4* 6.0*  ALBUMIN 2.6* 2.5*   No results for input(s): LIPASE, AMYLASE in the last 168 hours. No results for input(s): AMMONIA in the last 168 hours. Coagulation Profile: No  results for input(s): INR, PROTIME in the last 168 hours. Cardiac Enzymes: No results for input(s): CKTOTAL, CKMB, CKMBINDEX, TROPONINI in the last 168 hours. BNP (last 3 results) No results for input(s): PROBNP in the last 8760 hours. HbA1C: No results for input(s): HGBA1C in the last 72 hours. CBG: Recent Labs  Lab 12/10/20 1134 12/10/20 1748 12/10/20 1958 12/11/20 1009 12/11/20 2223  GLUCAP 113* 116* 102* 122* 111*   Lipid Profile: No results for input(s): CHOL, HDL, LDLCALC, TRIG, CHOLHDL, LDLDIRECT in the last 72 hours. Thyroid Function Tests: No results for input(s): TSH, T4TOTAL, FREET4, T3FREE, THYROIDAB in the last 72 hours. Anemia Panel: No results for input(s): VITAMINB12, FOLATE, FERRITIN, TIBC, IRON, RETICCTPCT in the last 72 hours. Sepsis Labs: No results for input(s): PROCALCITON, LATICACIDVEN in the last 168 hours.  No results found for this or any previous visit (from the past 240 hour(s)).    Radiology Studies: DG CHEST PORT 1 VIEW  Result Date: 12/12/2020 CLINICAL DATA:  Port-A-Cath placement EXAM: PORTABLE CHEST  1 VIEW COMPARISON:  11/15/2020 FINDINGS: Right subclavian Port-A-Cath has been placed with the tip in the SVC. Right PICC line in place with the tip in the SVC. No pneumothorax. Heart is normal size. Lungs clear. No effusions or acute bony abnormality. IMPRESSION: Right Port-A-Cath and right PICC line tips are in the SVC. No pneumothorax. No acute cardiopulmonary disease. Electronically Signed   By: Charlett Nose M.D.   On: 12/12/2020 11:05   DG Fluoro Guide CV Line-No Report  Result Date: 12/12/2020 Fluoroscopy was utilized by the requesting physician.  No radiographic interpretation.    Scheduled Meds: . acetaminophen  1,000 mg Oral Q6H  . Chlorhexidine Gluconate Cloth  6 each Topical Daily  . feeding supplement  237 mL Oral TID BM  . heparin injection (subcutaneous)  5,000 Units Subcutaneous Q8H  . influenza vac split quadrivalent PF  0.5 mL  Intramuscular Tomorrow-1000  . lidocaine  1 patch Transdermal Daily  . lip balm  1 application Topical BID  . [START ON 12/13/2020] multivitamin with minerals  1 tablet Oral Daily  . nicotine  21 mg Transdermal Daily  . [START ON 12/13/2020] pantoprazole  40 mg Oral Daily  . polycarbophil  625 mg Oral Daily  . sodium chloride flush  5 mL Intracatheter Q8H   Continuous Infusions: . ondansetron (ZOFRAN) IV 8 mg (12/01/20 0152)     LOS: 27 days   Time spent: 35 minutes   Angelle Isais Estill Cotta, MD [facility]  If 7PM-7AM, please contact night-coverage [email or web address] 12/12/2020, 1:49 PM

## 2020-12-12 NOTE — Progress Notes (Signed)
Pt's sister at bedside. Return demonstrates emptying and changing of ileostomy appropriately.

## 2020-12-12 NOTE — Progress Notes (Signed)
Day of Surgery  Subjective: CC: For port placement today.  Patient reports only abdominal soreness around midline wound.  No abdominal pain otherwise.  He tolerated soft diet yesterday and finished 100% of lunch and roughly 50% of dinner.  No nausea or emesis.  Having ileostomy output.  Mobilizing.  Voiding.  Objective: Vital signs in last 24 hours: Temp:  [97.8 F (36.6 C)-98.2 F (36.8 C)] 97.8 F (36.6 C) (03/03 0742) Pulse Rate:  [61-72] 61 (03/03 0742) Resp:  [16-17] 16 (03/03 0742) BP: (111-120)/(73-79) 120/76 (03/03 0742) SpO2:  [97 %-99 %] 97 % (03/03 0434) Weight:  [69.4 kg] 69.4 kg (03/03 0808) Last BM Date: 12/11/20 (ostomy.)  Intake/Output from previous day: 03/02 0701 - 03/03 0700 In: 1500 [P.O.:600; IV Piggyback:900] Out: 3670 [Urine:2470; Stool:1200] Intake/Output this shift: No intake/output data recorded.  PE: Gen: alert, cooperative NAD Heart: Reg rate Lungs: CTA b/l, normal rate and effort  Abd: soft,NT, ND, +BS, ileostomy in R hemiabdomen - edematous and viable - airand liquid stoolin pouch.1,200cc/24 hours. Midline wound with intermittent staples removed. No drainage or signs of infection. No evisceration. Msk: No LE edema.  Lab Results:  Recent Labs    12/12/20 0325  WBC 6.2  HGB 8.4*  HCT 28.3*  PLT 528*   BMET Recent Labs    12/12/20 0325  NA 133*  K 4.4  CL 101  CO2 24  GLUCOSE 101*  BUN 11  CREATININE 0.62  CALCIUM 9.1   PT/INR No results for input(s): LABPROT, INR in the last 72 hours. CMP     Component Value Date/Time   NA 133 (L) 12/12/2020 0325   K 4.4 12/12/2020 0325   CL 101 12/12/2020 0325   CO2 24 12/12/2020 0325   GLUCOSE 101 (H) 12/12/2020 0325   BUN 11 12/12/2020 0325   CREATININE 0.62 12/12/2020 0325   CALCIUM 9.1 12/12/2020 0325   PROT 6.0 (L) 12/12/2020 0325   ALBUMIN 2.5 (L) 12/12/2020 0325   AST 18 12/12/2020 0325   ALT 31 12/12/2020 0325   ALKPHOS 231 (H) 12/12/2020 0325   BILITOT 0.7  12/12/2020 0325   GFRNONAA >60 12/12/2020 0325   Lipase  No results found for: LIPASE     Studies/Results: No results found.  Anti-infectives: Anti-infectives (From admission, onward)   Start     Dose/Rate Route Frequency Ordered Stop   11/30/20 1045  ceFAZolin (ANCEF) IVPB 2g/100 mL premix  Status:  Discontinued        2 g 200 mL/hr over 30 Minutes Intravenous On call to O.R. 11/30/20 0957 11/30/20 1043   11/16/20 1000  remdesivir 100 mg in sodium chloride 0.9 % 100 mL IVPB       "Followed by" Linked Group Details   100 mg 200 mL/hr over 30 Minutes Intravenous Daily 11/15/20 2135 11/17/20 1002   11/15/20 2230  piperacillin-tazobactam (ZOSYN) IVPB 3.375 g        3.375 g 12.5 mL/hr over 240 Minutes Intravenous Every 8 hours 11/15/20 2144 12/03/20 2359   11/15/20 2134  remdesivir 200 mg in sodium chloride 0.9% 250 mL IVPB       "Followed by" Linked Group Details   200 mg 580 mL/hr over 30 Minutes Intravenous Once 11/15/20 2135 11/16/20 0029   11/15/20 1700  piperacillin-tazobactam (ZOSYN) IVPB 3.375 g        3.375 g 100 mL/hr over 30 Minutes Intravenous  Once 11/15/20 1657 11/15/20 1826       Assessment/Plan Anemia -  hgb 8.4 Hyponatremia Tobacco abuse COVID-19positive - off isolation 2/15 Severe malnutrition - prealbumin 5.8 (2/8), now 33.1. TPN weaned off.  Colonmass with perforationand RTP abscess Stage IV adenocarcinomacolonwith liver mets S/P EXPLORATORY LAPAROTOMY, RIGHT COLECTOMY, END ILEOSTOMY CREATION, CYSTOSCOPY W/ STENT PLACEMENT 11/28/20(Dr. Rosendo Gros; Dr. Abner Greenspan) - POD#14 - AFVSS. - s/p IR drain placement 2/7 - this drain was removed intra-operatively and new blake drain placed; IR cx Enterobacter aerogenes - IV Zosyn completed 2/22 at [facility]  - Drain removed 3/1 - OOB/IS  -For port-a-cath placement today. - Monitor ileostomy output. Start on fiber. Goal output < 1L  ID -zosyn 2/4-2/22 FEN -Tolerating soft diet. D/c TPN. Currently NPO for procedure.  Can have soft diet post op. VTE: SCDs, SQ heparin Foley -D/C-ed 2/18. Voiding.  Dispo -inpatient    LOS: 27 days    Jillyn Ledger , [facility] 12/12/2020, 8:18 AM Please see Amion for pager number during day hours 7:00am-4:30pm

## 2020-12-12 NOTE — Progress Notes (Signed)
Patient to have right side port-a-cath placed. Tolerated well per report. Vitals stable upon return. Pain managed with prn dilaudid q4 hours. Sister at bedside for support.

## 2020-12-12 NOTE — Transfer of Care (Signed)
Immediate Anesthesia Transfer of Care Note  Patient: Eric [name]  Procedure(s) Performed: INSERTION PORT-A-CATH (Right Chest)  Patient Location: PACU  Anesthesia Type:General  Level of Consciousness: drowsy and patient cooperative  Airway & Oxygen Therapy: Patient Spontanous Breathing  Post-op Assessment: Report given to RN, Post -op Vital signs reviewed and stable and Patient moving all extremities X 4  Post vital signs: Reviewed and stable  Last Vitals:  Vitals Value Taken Time  BP 121/80 12/12/20 1020  Temp    Pulse 74 12/12/20 1020  Resp 14 12/12/20 1020  SpO2 98 % 12/12/20 1020  Vitals shown include unvalidated device data.  Last Pain:  Vitals:   12/12/20 0742  TempSrc: Oral  PainSc:       Patients Stated Pain Goal: 2 (17/35/67 0141)  Complications: No complications documented.

## 2020-12-12 NOTE — Progress Notes (Signed)
Nutrition Follow-up  DOCUMENTATION CODES:   Non-severe (moderate) malnutrition in context of chronic illness  INTERVENTION:   -MVI with minerals daily -Ensure Enlive po TID, each supplement provides 350 kcal and 20 grams of protein  NUTRITION DIAGNOSIS:   Moderate Malnutrition related to  (illness) as evidenced by severe muscle depletion,moderate fat depletion,moderate muscle depletion.  Ongoing  GOAL:   Patient will meet greater than or equal to 90% of their needs  Progressing   MONITOR:   PO intake,Supplement acceptance,Skin,Weight trends,Labs,I & O's (TPN tolerance)  REASON FOR ASSESSMENT:   Malnutrition Screening Tool    ASSESSMENT:   50 year old male with history significant for tobacco abuse admitted with sepsis due to right retroperitoneal abscess and found to be positive for COVID-19. Pt who has not had any routine medical care for the last 10 years presents with 5 day onset of significant right groin pain that has been progressive and constant associated with chills and night sweats.  2/7 TPN initiated, s/p retroperitoneal abscess drain placement 2/9 Liver lesion biopsy,significant for colorectal adenocarcinoma 2/12- advanced to soft diet  2/13- TPN d/c, downgraded to full liquid diet 2/17- s/pProcedure(s) with comments: EXPLORATORY LAPAROTOMY (N/A) - DOW CASE TO START AT 730AM 4 HOUR CASE RIGHT COLECTOMY (Right) ILEOSTOMY CREATION (N/A) CYSTOSCOPY WITH STENT PLACEMENT (N/A) 2/20- NGT inserted, TPN initiated 2/24- NGT d/c, advanced to clear liquid diet 2/25- advanced to soft diet, TPN wean  2/26- NGT placed for decompression, TPN placed back to goal rate, NPO secondary to ileus 2/28- NGT d/c 3/1- advanced to clear liquids 3/2- advanced to soft diet 3/3- s/p Procedure: Portacath insertion, TPN d/c  Reviewed I/O's: -2.2 L x 24 hours and -20.7 L since 11/28/20  UOP: 2.5 L x 24 hours  Ileostomy output: 1.2 L x 24 hours  Pt unavailable at time of  visit.   Pt tolerating soft diet well. Noted meal completions 100%. Per pharmacy notes, TPN d/c today. Plan to resume diet today after procedure.   Pt with increased nutritional needs and would benefit from addition of oral nutrition supplements. Per oncology notes, pt will receive chemotherapy after discharge from hospital.   Medications reviewed and include lactated ringers infusion @ 10 ml/hr.   Labs reviewed: Na: 133, CBGS: 102-122 (inpatient orders for glycemic control are none).   Diet Order:   Diet Order            DIET SOFT Room service appropriate? Yes; Fluid consistency: Thin  Diet effective now                 EDUCATION NEEDS:   Not appropriate for education at this time  Skin:  Skin Assessment: Skin Integrity Issues: Skin Integrity Issues:: Incisions Incisions: rt upper adomen, abdomen, and perineum  Last BM:  12/11/20 (350 ml via ileostomy)  Height:   Ht Readings from Last 1 Encounters:  12/12/20 6' 0.01" (1.829 m)    Weight:   Wt Readings from Last 1 Encounters:  12/12/20 69.4 kg   BMI:  Body mass index is 20.75 kg/m.  Estimated Nutritional Needs:   Kcal:  2200-2430  Protein:  110-125 grams  Fluid:  >/= 2.2 L    Loistine Chance, RD, LDN, [place] Registered Dietitian II Certified Diabetes Care and Education Specialist Please refer to [facility] for RD and/or RD on-call/weekend/after hours pager

## 2020-12-12 NOTE — Anesthesia Procedure Notes (Signed)
Procedure Name: LMA Insertion Date/Time: 12/12/2020 9:40 AM Performed by: Darletta Moll, CRNA Pre-anesthesia Checklist: Patient identified, Emergency Drugs available, Suction available and Patient being monitored Patient Re-evaluated:Patient Re-evaluated prior to induction Oxygen Delivery Method: Circle system utilized Preoxygenation: Pre-oxygenation with 100% oxygen Induction Type: IV induction LMA: LMA inserted LMA Size: 4.0 Number of attempts: 1 Placement Confirmation: positive ETCO2,  breath sounds checked- equal and bilateral and CO2 detector Tube secured with: Tape Dental Injury: Teeth and Oropharynx as per pre-operative assessment

## 2020-12-13 ENCOUNTER — Inpatient Hospital Stay (HOSPITAL_COMMUNITY): Payer: Self-pay

## 2020-12-13 ENCOUNTER — Other Ambulatory Visit: Payer: Self-pay | Admitting: Internal Medicine

## 2020-12-13 ENCOUNTER — Encounter (HOSPITAL_COMMUNITY): Payer: Self-pay | Admitting: Surgery

## 2020-12-13 LAB — MAGNESIUM: Magnesium: 2 mg/dL (ref 1.7–2.4)

## 2020-12-13 LAB — CBC
HCT: 27 % — ABNORMAL LOW (ref 39.0–52.0)
Hemoglobin: 8.5 g/dL — ABNORMAL LOW (ref 13.0–17.0)
MCH: 25.4 pg — ABNORMAL LOW (ref 26.0–34.0)
MCHC: 31.5 g/dL (ref 30.0–36.0)
MCV: 80.8 fL (ref 80.0–100.0)
Platelets: 554 10*3/uL — ABNORMAL HIGH (ref 150–400)
RBC: 3.34 MIL/uL — ABNORMAL LOW (ref 4.22–5.81)
WBC: 9 10*3/uL (ref 4.0–10.5)
nRBC: 0 % (ref 0.0–0.2)

## 2020-12-13 LAB — BASIC METABOLIC PANEL
Anion gap: 9 (ref 5–15)
BUN: 13 mg/dL (ref 6–20)
CO2: 25 mmol/L (ref 22–32)
Calcium: 9.1 mg/dL (ref 8.9–10.3)
Chloride: 98 mmol/L (ref 98–111)
Creatinine, Ser: 0.63 mg/dL (ref 0.61–1.24)
GFR, Estimated: >60 mL/min (ref >60)
Glucose, Bld: 131 mg/dL — ABNORMAL HIGH (ref 70–99)
Potassium: 4.1 mmol/L (ref 3.5–5.1)
Sodium: 132 mmol/L — ABNORMAL LOW (ref 135–145)

## 2020-12-13 MED ORDER — ACETAMINOPHEN 500 MG PO TABS: 1000.0000 mg | ORAL_TABLET | Freq: Four times a day (QID) | ORAL | 0 refills | Status: DC

## 2020-12-13 MED ORDER — ONDANSETRON HCL 4 MG PO TABS: 4.0000 mg | ORAL_TABLET | Freq: Four times a day (QID) | ORAL | 0 refills | Status: DC | PRN

## 2020-12-13 MED ORDER — METHOCARBAMOL 500 MG PO TABS: 1000.0000 mg | ORAL_TABLET | Freq: Three times a day (TID) | ORAL | 0 refills | Status: DC | PRN

## 2020-12-13 MED ORDER — OXYCODONE HCL 5 MG PO TABS: 5.0000 mg | ORAL_TABLET | Freq: Four times a day (QID) | ORAL | 0 refills | Status: DC | PRN

## 2020-12-13 MED ORDER — PANTOPRAZOLE SODIUM 40 MG PO TBEC: 40.0000 mg | DELAYED_RELEASE_TABLET | Freq: Every day | ORAL | 0 refills | Status: DC

## 2020-12-13 MED ORDER — ENSURE ENLIVE PO LIQD: 237.0000 mL | Freq: Three times a day (TID) | ORAL | 12 refills | Status: DC

## 2020-12-13 MED ORDER — ADULT MULTIVITAMIN W/MINERALS CH: 1.0000 | ORAL_TABLET | Freq: Every day | ORAL | Status: DC

## 2020-12-13 MED ORDER — CALCIUM POLYCARBOPHIL 625 MG PO TABS: 625.0000 mg | ORAL_TABLET | Freq: Every day | ORAL | 1 refills | Status: DC

## 2020-12-13 MED ORDER — SODIUM CHLORIDE 0.9 % IV SOLN
INTRAVENOUS | Status: DC | PRN
  Administered 2020-12-13: 250 mL via INTRAVENOUS

## 2020-12-13 MED FILL — METHOCARBAMOL 500 MG TABS: 500 | 7 days supply | Qty: 45 | Fill #0

## 2020-12-13 MED FILL — ACETAMINOPHEN 500MG XT STRE: 500 | 5 days supply | Qty: 30 | Fill #0

## 2020-12-13 MED FILL — ONDANSETRON HCL 4 MG TABLET: 4 | 5 days supply | Qty: 20 | Fill #0

## 2020-12-13 MED FILL — SM FIBER 625 MG TABS: 625 | 30 days supply | Qty: 30 | Fill #0

## 2020-12-13 MED FILL — PANTOPRAZOLE SOD DR 40 MG T: 40 | 30 days supply | Qty: 30 | Fill #0

## 2020-12-13 MED FILL — oxyCODONE HCL 5 MG TABS: 5 | 3 days supply | Qty: 15 | Fill #0

## 2020-12-13 NOTE — Plan of Care (Signed)
  Problem: Education: Goal: Knowledge of risk factors and measures for prevention of condition will improve Outcome: Adequate for Discharge   Problem: Coping: Goal: Psychosocial and spiritual needs will be supported Outcome: Adequate for Discharge   Problem: Respiratory: Goal: Will maintain a patent airway Outcome: Adequate for Discharge Goal: Complications related to the disease process, condition or treatment will be avoided or minimized Outcome: Adequate for Discharge   Problem: Education: Goal: Knowledge of General Education information will improve Description: Including pain rating scale, medication(s)/side effects and non-pharmacologic comfort measures Outcome: Adequate for Discharge   Problem: Health Behavior/Discharge Planning: Goal: Ability to manage health-related needs will improve Outcome: Adequate for Discharge   Problem: Clinical Measurements: Goal: Ability to maintain clinical measurements within normal limits will improve Outcome: Adequate for Discharge Goal: Will remain free from infection Outcome: Adequate for Discharge Goal: Diagnostic test results will improve Outcome: Adequate for Discharge Goal: Respiratory complications will improve Outcome: Adequate for Discharge Goal: Cardiovascular complication will be avoided Outcome: Adequate for Discharge   Problem: Activity: Goal: Risk for activity intolerance will decrease Outcome: Adequate for Discharge   Problem: Nutrition: Goal: Adequate nutrition will be maintained Outcome: Adequate for Discharge   Problem: Coping: Goal: Level of anxiety will decrease Outcome: Adequate for Discharge   Problem: Elimination: Goal: Will not experience complications related to bowel motility Outcome: Adequate for Discharge Goal: Will not experience complications related to urinary retention Outcome: Adequate for Discharge   Problem: Pain Managment: Goal: General experience of comfort will improve Outcome: Adequate  for Discharge   Problem: Safety: Goal: Ability to remain free from injury will improve Outcome: Adequate for Discharge   Problem: Skin Integrity: Goal: Risk for impaired skin integrity will decrease Outcome: Adequate for Discharge   Problem: Increased Nutrient Needs (NI-5.1) Goal: Food and/or nutrient delivery Description: Individualized approach for food/nutrient provision. Outcome: Adequate for Discharge   Problem: Malnutrition  (NI-5.2) Goal: Food and/or nutrient delivery Description: Individualized approach for food/nutrient provision. Outcome: Adequate for Discharge   Problem: Acute Rehab PT Goals(only PT should resolve) Goal: Pt Will Ambulate Outcome: Adequate for Discharge   Problem: Acute Rehab PT Goals(only PT should resolve) Goal: Pt/caregiver will Perform Home Exercise Program Outcome: Adequate for Discharge   Problem: Acute Rehab PT Goals(only PT should resolve) Goal: Pt will Roll Supine to Side Outcome: Adequate for Discharge Goal: Pt Will Go Supine/Side To Sit Outcome: Adequate for Discharge Goal: Pt Will Go Sit To Supine/Side Outcome: Adequate for Discharge Goal: Patient Will Transfer Sit To/From Stand Outcome: Adequate for Discharge Goal: Pt Will Ambulate Outcome: Adequate for Discharge

## 2020-12-13 NOTE — Progress Notes (Signed)
1 Day Post-Op  Subjective: CC: Doing well. No sob. Tolerating diet without n/v. Some soreness around midline wound, otherwise no pain. Having ileostomy output. Mobilizing. Voiding.   Objective: Vital signs in last 24 hours: Temp:  [97.3 F (36.3 C)-98.2 F (36.8 C)] 97.8 F (36.6 C) (03/04 0450) Pulse Rate:  [61-77] 61 (03/04 0450) Resp:  [11-17] 17 (03/04 0450) BP: (107-128)/(78-89) 128/79 (03/04 0450) SpO2:  [96 %-100 %] 99 % (03/04 0450) Last BM Date: 12/12/20  Intake/Output from previous day: 03/03 0701 - 03/04 0700 In: 400 [I.V.:400] Out: 1205 [Urine:650; Stool:550; Blood:5] Intake/Output this shift: No intake/output data recorded.  PE: Gen: alert, cooperative NAD Heart: Reg rate Lungs: CTA b/l, normal rate and effort. R chest wall with port in place. Incision c/d/i Abd: Soft,NT, ND, +BS, ileostomy in R hemiabdomen - edematous and viable -airand semi-liquid stoolin pouch.550cc/24 hours documented.Midline wound with intermittent staples removed (will remove remaining staples today). No drainage or signs of infection. No evisceration. Msk: No LE edema.  Lab Results:  Recent Labs    12/12/20 0325 12/13/20 0336  WBC 6.2 9.0  HGB 8.4* 8.5*  HCT 28.3* 27.0*  PLT 528* 554*   BMET Recent Labs    12/12/20 0325 12/13/20 0336  NA 133* 132*  K 4.4 4.1  CL 101 98  CO2 24 25  GLUCOSE 101* 131*  BUN 11 13  CREATININE 0.62 0.63  CALCIUM 9.1 9.1   PT/INR No results for input(s): LABPROT, INR in the last 72 hours. CMP     Component Value Date/Time   NA 132 (L) 12/13/2020 0336   K 4.1 12/13/2020 0336   CL 98 12/13/2020 0336   CO2 25 12/13/2020 0336   GLUCOSE 131 (H) 12/13/2020 0336   BUN 13 12/13/2020 0336   CREATININE 0.63 12/13/2020 0336   CALCIUM 9.1 12/13/2020 0336   PROT 6.0 (L) 12/12/2020 0325   ALBUMIN 2.5 (L) 12/12/2020 0325   AST 18 12/12/2020 0325   ALT 31 12/12/2020 0325   ALKPHOS 231 (H) 12/12/2020 0325   BILITOT 0.7 12/12/2020 0325    GFRNONAA >60 12/13/2020 0336   Lipase  No results found for: LIPASE     Studies/Results: DG CHEST PORT 1 VIEW  Result Date: 12/12/2020 CLINICAL DATA:  Port-A-Cath placement EXAM: PORTABLE CHEST 1 VIEW COMPARISON:  11/15/2020 FINDINGS: Right subclavian Port-A-Cath has been placed with the tip in the SVC. Right PICC line in place with the tip in the SVC. No pneumothorax. Heart is normal size. Lungs clear. No effusions or acute bony abnormality. IMPRESSION: Right Port-A-Cath and right PICC line tips are in the SVC. No pneumothorax. No acute cardiopulmonary disease. Electronically Signed   By: Rolm Baptise M.D.   On: 12/12/2020 11:05   DG Fluoro Guide CV Line-No Report  Result Date: 12/12/2020 Fluoroscopy was utilized by the requesting physician.  No radiographic interpretation.    Anti-infectives: Anti-infectives (From admission, onward)   Start     Dose/Rate Route Frequency Ordered Stop   11/30/20 1045  ceFAZolin (ANCEF) IVPB 2g/100 mL premix  Status:  Discontinued        2 g 200 mL/hr over 30 Minutes Intravenous On call to O.R. 11/30/20 0957 11/30/20 1043   11/16/20 1000  remdesivir 100 mg in sodium chloride 0.9 % 100 mL IVPB       "Followed by" Linked Group Details   100 mg 200 mL/hr over 30 Minutes Intravenous Daily 11/15/20 2135 11/17/20 1002   11/15/20 2230  piperacillin-tazobactam (  ZOSYN) IVPB 3.375 g        3.375 g 12.5 mL/hr over 240 Minutes Intravenous Every 8 hours 11/15/20 2144 12/03/20 2359   11/15/20 2134  remdesivir 200 mg in sodium chloride 0.9% 250 mL IVPB       "Followed by" Linked Group Details   200 mg 580 mL/hr over 30 Minutes Intravenous Once 11/15/20 2135 11/16/20 0029   11/15/20 1700  piperacillin-tazobactam (ZOSYN) IVPB 3.375 g        3.375 g 100 mL/hr over 30 Minutes Intravenous  Once 11/15/20 1657 11/15/20 1826       Assessment/Plan Anemia - hgb 8.4 Hyponatremia Tobacco abuse COVID-19positive - off isolation 2/15 Severe malnutrition -  prealbumin 5.8 (2/8), now 33.1. TPN weaned off.  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 Rosendo Gros; Dr. Abner Greenspan) - S/p Portacath insertion - Dr. [name] - 12/12/2020 - POD #15 and 1 respectively  - CXR post op without PTX - AFVSS.  - S/p IR drain placement 2/7 - this drain was removed intra-operatively and new blake drain placed; IR cx Enterobacter aerogenes. IV Zosyn completed 2/22 at [facility]. Surgical Drain removed 3/1 - Continue to mobilize and pulm toilet - Monitor ileostomy output. On fiber - continue. Stool bulking up.Goal output < 1L. We discussed monitoring output at home. - Per notes, patients sister Lennie Odor, has changed the colostomy bag and is going to help at home. Patient worked with [facility] and patient demonstrated how to open/close, clean, a sample pouch; and, he burped the existing pouch. Patient did not qualify for [facility] RN.  - Patient is afebrile w/ VSS, WBC 9.0 (3/4) off abx, pain well controlled, tolerating a diet, off TPN, mobilizing and voiding. He is okay for d/c from our standpoint. We have arranged follow up in the office and will send pain meds to TOC. Will reach out to [facility] to let them know. Patient will need follow up with Oncology as an outpatient. Dr. Benay Spice has been following as an outpatient and is planning to initiate Folfox in the future. He is scheduled for follow up at the cancer center for 12/18/20.   ID -zosyn 2/4-2/22. None currently.  FEN - Soft diet VTE: SCDs,SQ heparin Foley -D/C-ed 2/18. Voiding.    LOS: 28 days    Jillyn Ledger , [facility] 12/13/2020, 8:38 AM Please see Amion for pager number during day hours 7:00am-4:30pm

## 2020-12-13 NOTE — Progress Notes (Signed)
Called by RN. Patient developed burping, belching, pain, distension and n/v.   Came to patients bedside. Patient reports that within the last hour he started having some nausea. ~15 minutes before I evaluated him he develop distension + sharp pain in the center of his abdomen followed by an episode of emesis. He received dilaudid and states that he feels better and is no longer having any pain or nausea.   Vitals: Blood pressure 118/82, pulse 68, temperature 98 F (36.7 C), temperature source Oral, resp. rate 17, height 6' 0.01" (1.829 m), weight 69.4 kg, SpO2 100 %. Abd exam: Soft, moderate distension, NT on light or deep palpation. +BS, ileostomy in R hemiabdomen - edematous and viable -airand semi-liquid stoolin pouch. Actively passing liquid stool into pouch while I was in the room and having flatus.Midline wound stable and without evisceration or signs of infection.   Will plan to make NPO except ice chips and get xray. Reached out to [facility] about keeping overnight and they agree and will cancel d/c order at this time.   Alferd Apa, PA-C

## 2020-12-13 NOTE — Discharge Summary (Signed)
Physician Discharge Summary  Eric [name] UTM:[id number] DOB: 1971/03/29 DOA: 11/15/2020  PCP: [facility], [facility]  Admit date: 11/15/2020 Discharge date: 12/13/2020  Admitted From: Home Disposition: Home  Recommendations for Outpatient Follow-up:  1. Follow up with PCP in 1-2 weeks 2. Please obtain BMP/CBC in one week 3. Follow-up with general surgery as a scheduled 4. Follow-up with oncology as a scheduled  Home Health: None Equipment/Devices: None Discharge Condition: Stable CODE STATUS: Full code Diet recommendation: Soft diet  Brief/Interim Summary: 50 year old male with no known PMH presented with right groin pain found to have retroperitoneal abscess with bowel perforation cecal mass,incidental Covid positive seen by surgery placed on TPN for severe protein calorie malnutrition and had IR drain the abscess.Patient underwent right hemicolectomy with ileostomy 2/17. Post-op course complicated by ileus, NGT was placed 2/20. Patient is being managed with IV antibiotics leukocytosis much improved, remains on TPN with plan to wean off TPN as ileus improves. 2/22-attempted NG clamping but he vomited. Antibiotic Zosyn completed 2/22. 2/25-diet started 2/26 patient feeling nauseous also vomited, x-ray abdomen showed severely dilated small bowel loops concerning for distal small bowel obstruction-NG tube was placed back in.  Perforated Stage IV colon cancer with RTP abscess s/p right colectomy/ileostomy 11/28/20, s/p cystoscopy and ureteral stent, with liver mets -Complicated with postop ileus: -POD #15 -On 2/2: He started tolerating soft diet without any issues.    TPN discontinued.  Begin fiber diet and monitor ileostomy output (goal output is less than 1 L) -Continued as needed pain medication -Oncology to start chemo once he is recovered from the surgery.  He will need to follow-up with Dr. Benay Spice and is planning to initiate FOLFOX in the future.  He is scheduled for  follow-up at [place] on 12/18/2020. -He Underwent Port-A-Cath placement today 3/3  Sepsis POA 2/2 perforated colon mass: -Sepsis resolved. -Status post drain placement on 2/7-and removed on 3/1 -culture grew Enterobactererrogeneous.  Remained afebrile with no leukocytosis. -Completed abx Zosyn- from 2/4 to 2/22.  Blood loss anemia/microcytic anemia -with hemoglobin 5.4 g on admission 2/2 chronic GI bleed from colon cancer. -S/P PRBC x4 units -H&H remained stable.  Deconditioning: Resolved.  Ambulating well.  Moderate protein calorie malnutrition:  -In the setting of underlying stage IV colon cancer with liver metastasis.  Advanced diet to soft diet.  DC TPN.  Tolerating soft diet without any issues.  Thrombocytosislikely reactive.Downtrending.  Hyponatremia: -Sodium 133 on 2/28.  Hyperglycemic-likely reactive. A1c is normal.  COVID-19 virus infection: Incidental/asymptomatic s/px3 days remdesivir,off isolation 2/15.  Tobacco abuse: Counseled to quit -Nicotine patch  Discharge Diagnoses:  Perforated stage IV colon cancer with liver mets with RTP abscess status post right colectomy/ileostomy Status post cystoscopy and ureteral stent placement Status post Port-A-Cath insertion Postop ileus Sepsis secondary to perforated colon mass Acute blood loss anemia/microcytic anemia Deconditioning Moderate protein calorie malnutrition Thrombocytosis Hyponatremia Hyperglycemia COVID-19 infection Tobacco abuse   Discharge Instructions  Discharge Instructions    Diet - low sodium heart healthy   Complete by: As directed    Discharge instructions   Complete by: As directed    Follow-up with PCP in 1 to 2 weeks Follow-up with general surgery as a scheduled Follow-up with oncology as a scheduled   Increase activity slowly   Complete by: As directed    No dressing needed   Complete by: As directed      Allergies as of 12/13/2020   No Known Allergies      Medication List  TAKE these medications   acetaminophen 500 MG tablet Commonly known as: TYLENOL Take 2 tablets (1,000 mg total) by mouth every 6 (six) hours. What changed:   how much to take  when to take this  reasons to take this   ALKA-SELTZER ANTACID PO Take 1 tablet by mouth daily as needed (For pain).   famotidine 20 MG tablet Commonly known as: PEPCID Take 20 mg by mouth 2 (two) times daily.   feeding supplement Liqd Take 237 mLs by mouth 3 (three) times daily between meals.   methocarbamol 500 MG tablet Commonly known as: ROBAXIN Take 2 tablets (1,000 mg total) by mouth every 8 (eight) hours as needed for muscle spasms.   multivitamin with minerals Tabs tablet Take 1 tablet by mouth daily. Start taking on: December 14, 2020   ondansetron 4 MG tablet Commonly known as: ZOFRAN Take 1 tablet (4 mg total) by mouth every 6 (six) hours as needed for nausea.   oxyCODONE 5 MG immediate release tablet Commonly known as: Oxy IR/ROXICODONE Take 1 tablet (5 mg total) by mouth every 6 (six) hours as needed for breakthrough pain.   pantoprazole 40 MG tablet Commonly known as: PROTONIX Take 1 tablet (40 mg total) by mouth daily. Start taking on: December 14, 2020   polycarbophil 625 MG tablet Commonly known as: FIBERCON Take 1 tablet (625 mg total) by mouth daily.            Durable Medical Equipment  (From admission, onward)         Start     Ordered   12/06/20 1336  For home use only DME Other see comment  Once       Comments: dressing supplies what does he need? Normal saline 4 x 4's ABD pads and tape  Question:  Length of Need  Answer:  6 Months   12/06/20 1336           Discharge Care Instructions  (From admission, onward)         Start     Ordered   12/13/20 0000  No dressing needed        12/13/20 1000          Follow-up Information    [facility] Follow up.   Contact information: [place] [address] [phone]       Ladell Pier, MD. Go on 12/18/2020.   Specialty: Oncology Why: Please call to confirm your appointment time for follow up Contact information: [place] [address] [phone]        Ralene Ok, MD. Go on 01/03/2021.   Specialty: General Surgery Why: 830am. Please arrive 30 minutes prior to your appointment for paperwork. Please bring a copy of your photo ID and insurance card  Contact information: [place] [address] [phone]              No Known Allergies  Consultations:  General surgery  Oncology   Procedures/Studies: DG Abd 1 View  Result Date: 12/01/2020 CLINICAL DATA:  Evaluate NG tube EXAM: ABDOMEN - 1 VIEW COMPARISON:  December 01, 2020 FINDINGS: The NG tube is been reposition in the interval now terminates in the stomach. IMPRESSION: The NG tube has been reposition in the interval and now terminates in the stomach. Electronically Signed   By: Dorise Bullion III M.D   On: 12/01/2020 12:43   DG Abd 1  View  Result Date: 12/01/2020 CLINICAL DATA:  Evaluate NG tube placement EXAM: ABDOMEN - 1 VIEW COMPARISON:  12/01/2020 FINDINGS: The air is a right arm PICC line with tip noted in the projection of the cavoatrial junction. The nasogastric tube is looped within the distal half of the esophagus with tip and side port above the GE junction and directed cranially. Dilated loop of bowel is noted in the left abdomen. There is a large stool burden overlying the right lower quadrant of the abdomen. IMPRESSION: 1. The nasogastric tube is looped within the distal half of the esophagus with tip and side port above the GE junction. 2. Dilated loop of bowel in the left abdomen. 3. Large stool burden overlying the right lower quadrant of the abdomen. Electronically Signed   By: Kerby Moors M.D.   On: 12/01/2020 12:37   DG Abd 1 View  Result Date:  12/01/2020 CLINICAL DATA:  NG tube placement. EXAM: ABDOMEN - 1 VIEW COMPARISON:  CT AP 11/25/2020 FINDINGS: Right arm PICC line tip is at the cavoatrial junction. The nasogastric tube is looped within the distal half of the esophagus. The distal end of the NG tube is oriented cranially with tip at the level of the carina. IMPRESSION: The nasogastric tube is looped within the distal half of the esophagus. The distal end of the NG tube is oriented cranially with tip at the level of the carina. Advise repositioning. Electronically Signed   By: Kerby Moors M.D.   On: 12/01/2020 12:29   CT Angio Chest PE W and/or Wo Contrast  Result Date: 11/15/2020 CLINICAL DATA:  Positive D-dimer, COVID-19 positive, tobacco abuse, right lower quadrant abdominal pain EXAM: CT ANGIOGRAPHY CHEST CT ABDOMEN AND PELVIS WITH CONTRAST TECHNIQUE: Multidetector CT imaging of the chest was performed using the standard protocol during bolus administration of intravenous contrast. Multiplanar CT image reconstructions and MIPs were obtained to evaluate the vascular anatomy. Multidetector CT imaging of the abdomen and pelvis was performed using the standard protocol during bolus administration of intravenous contrast. CONTRAST:  39mL OMNIPAQUE IOHEXOL 350 MG/ML SOLN COMPARISON:  11/15/2020 FINDINGS: CTA CHEST FINDINGS Cardiovascular: This is a technically adequate evaluation of the pulmonary vasculature. No filling defects or pulmonary emboli. The heart is unremarkable without pericardial effusion. Mild atherosclerosis within the LAD distribution of the coronary vasculature. Normal caliber of the thoracic aorta without evidence of dissection. Mediastinum/Nodes: Mild wall thickening of the distal thoracic esophagus is nonspecific and could reflect esophagitis. Thyroid and trachea are unremarkable. No pathologic adenopathy. Lungs/Pleura: No acute airspace disease, effusion, or pneumothorax. Mild upper lobe predominant emphysema with areas of  subpleural scarring. Central airways are patent. Musculoskeletal: No acute or destructive bony lesions. Reconstructed images demonstrate no additional findings. Review of the MIP images confirms the above findings. CT ABDOMEN and PELVIS FINDINGS Hepatobiliary: There is an indeterminate hypodensity within the inferior aspect right lobe liver, measuring approximately 3.5 x 2.5 cm reference image 36/4. No other focal liver abnormalities. The gallbladder is unremarkable. No intrahepatic duct dilation. Pancreas: Unremarkable. No pancreatic ductal dilatation or surrounding inflammatory changes. Spleen: Normal in size without focal abnormality. Adrenals/Urinary Tract: Adrenal glands are unremarkable. Kidneys are normal, without renal calculi, focal lesion, or hydronephrosis. Bladder is unremarkable. Stomach/Bowel: There is masslike wall thickening involving the cecum, extending approximately 9 cm in length. There is evidence of cecal perforation posteriorly, with a complex multiloculated retroperitoneal fluid collection. There are intramuscular components of the fluid collection within the right psoas and iliacus muscles, with a fluid  collection extending into the proximal aspect of the iliopsoas tendon. This collection measures approximately 9.1 x 6.7 cm in transverse dimension, and extends approximately 18 cm in craniocaudal length. Differential diagnosis includes neoplasm or severe colitis. A normal gas-filled appendix is seen in the right lower quadrant. Vascular/Lymphatic: No discrete adenopathy within the abdomen or pelvis. Moderate atherosclerosis of the aorta. Reproductive: Prostate is unremarkable. Other: There is no free intraperitoneal fluid or free gas. No abdominal wall hernia. Musculoskeletal: No acute or destructive bony lesions. Reconstructed images demonstrate no additional findings. Review of the MIP images confirms the above findings. IMPRESSION: 1. Long segment masslike mural thickening of the cecum,  concerning for neoplasm. 2. Posterior perforation at the site of cecal wall thickening, with multilocular right retroperitoneal abscess which extends into the right psoas and right iliacus muscles as above. 3. Indeterminate hypodensity inferior right lobe liver. Dedicated nonemergent outpatient liver MRI could be performed for definitive characterization. 4. No evidence of pulmonary embolus. 5. Mild wall thickening of the distal thoracic esophagus, which could reflect mild esophagitis. 6. Aortic Atherosclerosis (ICD10-I70.0) and Emphysema (ICD10-J43.9). Critical Value/emergent results were called by telephone at the time of interpretation on 11/15/2020 at 4:16 pm to provider [place], who verbally acknowledged these results. Electronically Signed   By: Randa Ngo M.D.   On: 11/15/2020 16:22   MR PELVIS W WO CONTRAST  Result Date: 11/20/2020 CLINICAL DATA:  History of pelvic abscess. 3.5 cm liver lesion on CT. Scrotal pain. EXAM: MRI ABDOMEN AND PELVIS WITHOUT AND WITH CONTRAST TECHNIQUE: Multiplanar multisequence MR imaging of the abdomen and pelvis was performed both before and after the administration of intravenous contrast. CONTRAST:  23mL GADAVIST GADOBUTROL 1 MMOL/ML IV SOLN COMPARISON:  CT scan 11/15/2020. FINDINGS: COMBINED FINDINGS FOR BOTH MR ABDOMEN AND PELVIS Lower chest: Unremarkable. Hepatobiliary: As noted on recent CT scan, there is a 3.5 x 2.5 cm heterogeneous lesion in the inferior right liver peripherally. This shows irregular peripheral and heterogeneous central enhancement after IV contrast administration. T2 imaging shows a surrounding halo of edema. No substantial restricted diffusion. A similar lesion is identified in the medial subcapsular right liver (segment VI) measuring 1.9 x 2.1 cm (see postcontrast image 48 of series 26). 1 additional irregular rim enhancing lesion is seen in the inferior left liver (segment IV B) visible on image 59 of series 26. 17 mm simple cyst identified  lateral segment left liver. Several additional 3-4 mm hypoenhancing foci are seen scattered in the liver parenchyma, much too small to characterize but likely benign and may be tiny cysts or biliary hamartomas. There is no evidence for gallstones, gallbladder wall thickening, or pericholecystic fluid. Probable adenomyomatosis at the fundus. No intrahepatic or extrahepatic biliary dilation. Pancreas: No focal mass lesion. No dilatation of the main duct. No intraparenchymal cyst. No peripancreatic edema. Spleen:  No splenomegaly. No focal mass lesion. Adrenals/Urinary Tract: No adrenal nodule or mass. Kidneys unremarkable. No hydroureter. Bladder unremarkable. Stomach/Bowel: Stomach is unremarkable. No gastric wall thickening. No evidence of outlet obstruction. Duodenum is normally positioned as is the ligament of Treitz. Mild diffuse distention of small bowel evident. Marked wall thickening again noted in the cecum. Colon is diffusely distended and stool-filled as before. Vascular/Lymphatic: No abdominal aortic aneurysm There is no gastrohepatic or hepatoduodenal ligament lymphadenopathy. No retroperitoneal or mesenteric lymphadenopathy. No pelvic sidewall lymphadenopathy. Reproductive: The prostate gland and seminal vesicles are unremarkable. Although not a dedicated scrotal exam, there is no evidence for testicular mass lesion or hydrocele. Other:  No substantial  intraperitoneal free fluid. Musculoskeletal: Right iliopsoas abscess again noted with percutaneous drainage catheter now in place with distal loop formed in the psoas component of the process. Interval decrease in size of the rim enhancing fluid collection seen extending down anterior to the right hip as before. No suspicious focal marrow abnormality. IMPRESSION: 1. 3.5 x 2.5 cm heterogeneous enhancing lesion in the inferior peripheral right liver with 2 additional smaller lesions having similar imaging features. Given the large right retroperitoneal  abscess, intrahepatic abscesses would be a distinct consideration. Metastatic disease also a consideration. Tissue sampling likely warranted. 2. Interval decrease in size of the rim enhancing right iliopsoas fluid collection status post percutaneous drain placement. 3. Marked wall thickening again noted in the cecum with diffuse distention of small bowel loops. These findings are better characterized on the recent CT scan. Electronically Signed   By: Misty Stanley M.D.   On: 11/20/2020 05:53   MR ABDOMEN W WO CONTRAST  Result Date: 11/20/2020 CLINICAL DATA:  History of pelvic abscess. 3.5 cm liver lesion on CT. Scrotal pain. EXAM: MRI ABDOMEN AND PELVIS WITHOUT AND WITH CONTRAST TECHNIQUE: Multiplanar multisequence MR imaging of the abdomen and pelvis was performed both before and after the administration of intravenous contrast. CONTRAST:  91mL GADAVIST GADOBUTROL 1 MMOL/ML IV SOLN COMPARISON:  CT scan 11/15/2020. FINDINGS: COMBINED FINDINGS FOR BOTH MR ABDOMEN AND PELVIS Lower chest: Unremarkable. Hepatobiliary: As noted on recent CT scan, there is a 3.5 x 2.5 cm heterogeneous lesion in the inferior right liver peripherally. This shows irregular peripheral and heterogeneous central enhancement after IV contrast administration. T2 imaging shows a surrounding halo of edema. No substantial restricted diffusion. A similar lesion is identified in the medial subcapsular right liver (segment VI) measuring 1.9 x 2.1 cm (see postcontrast image 48 of series 26). 1 additional irregular rim enhancing lesion is seen in the inferior left liver (segment IV B) visible on image 59 of series 26. 17 mm simple cyst identified lateral segment left liver. Several additional 3-4 mm hypoenhancing foci are seen scattered in the liver parenchyma, much too small to characterize but likely benign and may be tiny cysts or biliary hamartomas. There is no evidence for gallstones, gallbladder wall thickening, or pericholecystic fluid. Probable  adenomyomatosis at the fundus. No intrahepatic or extrahepatic biliary dilation. Pancreas: No focal mass lesion. No dilatation of the main duct. No intraparenchymal cyst. No peripancreatic edema. Spleen:  No splenomegaly. No focal mass lesion. Adrenals/Urinary Tract: No adrenal nodule or mass. Kidneys unremarkable. No hydroureter. Bladder unremarkable. Stomach/Bowel: Stomach is unremarkable. No gastric wall thickening. No evidence of outlet obstruction. Duodenum is normally positioned as is the ligament of Treitz. Mild diffuse distention of small bowel evident. Marked wall thickening again noted in the cecum. Colon is diffusely distended and stool-filled as before. Vascular/Lymphatic: No abdominal aortic aneurysm There is no gastrohepatic or hepatoduodenal ligament lymphadenopathy. No retroperitoneal or mesenteric lymphadenopathy. No pelvic sidewall lymphadenopathy. Reproductive: The prostate gland and seminal vesicles are unremarkable. Although not a dedicated scrotal exam, there is no evidence for testicular mass lesion or hydrocele. Other:  No substantial intraperitoneal free fluid. Musculoskeletal: Right iliopsoas abscess again noted with percutaneous drainage catheter now in place with distal loop formed in the psoas component of the process. Interval decrease in size of the rim enhancing fluid collection seen extending down anterior to the right hip as before. No suspicious focal marrow abnormality. IMPRESSION: 1. 3.5 x 2.5 cm heterogeneous enhancing lesion in the inferior peripheral right liver with 2 additional  smaller lesions having similar imaging features. Given the large right retroperitoneal abscess, intrahepatic abscesses would be a distinct consideration. Metastatic disease also a consideration. Tissue sampling likely warranted. 2. Interval decrease in size of the rim enhancing right iliopsoas fluid collection status post percutaneous drain placement. 3. Marked wall thickening again noted in the cecum  with diffuse distention of small bowel loops. These findings are better characterized on the recent CT scan. Electronically Signed   By: Misty Stanley M.D.   On: 11/20/2020 05:53   CT ABDOMEN PELVIS W CONTRAST  Result Date: 12/09/2020 CLINICAL DATA:  Postop abdominal pain and distension. Fever. Perforated colon carcinoma. EXAM: CT ABDOMEN AND PELVIS WITH CONTRAST TECHNIQUE: Multidetector CT imaging of the abdomen and pelvis was performed using the standard protocol following bolus administration of intravenous contrast. CONTRAST:  145mL OMNIPAQUE IOHEXOL 300 MG/ML  SOLN COMPARISON:  11/25/2020 FINDINGS: Lower Chest: No acute findings. Hepatobiliary: Several hypovascular hepatic masses are seen, some which are new or increased since previous study. Dominant lesion in anterior segment the right lobe measures 4.3 x 2.3 cm on image 32/3, compared to 3.4 x 2.2 cm previously. A hypovascular mass in the right lobe along posterior margin the gallbladder fossa currently measures 2.7 x 2.1 cm on image 29/3, compared to 2.3 x 1.8 cm previously. Two small hypovascular masses adjacent to the gallbladder fossa are also slightly increased in size since previous study. These are consistent with progressive liver metastases. Small cyst in the lateral segment of the left lobe is stable. Gallbladder is unremarkable. No evidence of biliary ductal dilatation. Pancreas:  No mass or inflammatory changes. Spleen: Within normal limits in size and appearance. Adrenals/Urinary Tract: No masses identified. No evidence of ureteral calculi or hydronephrosis. Stomach/Bowel: Patient has undergone right colectomy and right lower quadrant ileostomy since prior study. Surgical drain is seen in the right abdomen. Nasogastric tube is seen within the stomach. No evidence of bowel obstruction or abscess. Vascular/Lymphatic: Right iliopsoas surgical drain is seen place. Persistent lobulated soft tissue density in the right retroperitoneum along the  psoas and iliacus muscles, at site of previous percutaneous drainage catheter. This shows no significant change since prior study. No new or enlarging soft tissue masses are identified. No lymphadenopathy identified. Reproductive:  No mass or other significant abnormality. Other:  None. Musculoskeletal:  No suspicious bone lesions identified. IMPRESSION: Interval right colectomy and right lower quadrant ileostomy. No evidence of abscess or bowel obstruction. Stable right retroperitoneal soft tissue density along the psoas and iliacus muscles, at site of previous percutaneous drainage catheter. Metastatic disease cannot be excluded. Mild progression of liver metastases since prior study. Electronically Signed   By: Marlaine Hind M.D.   On: 12/09/2020 13:49   CT ABDOMEN PELVIS W CONTRAST  Result Date: 11/26/2020 CLINICAL DATA:  Abdominal pain, abscess suspected, history of colon cancer EXAM: CT ABDOMEN AND PELVIS WITH CONTRAST TECHNIQUE: Multidetector CT imaging of the abdomen and pelvis was performed using the standard protocol following bolus administration of intravenous contrast. CONTRAST:  165mL OMNIPAQUE IOHEXOL 300 MG/ML  SOLN COMPARISON:  11/15/2020 FINDINGS: Lower chest: No acute abnormality. Hepatobiliary: Redemonstrated ill-defined hypodense lesion of the subcapsular inferior right lobe of the liver, hepatic segment VI, measuring 3.6 x 2.4 cm (series 10, image 29). No gallstones, gallbladder wall thickening, or biliary dilatation. Pancreas: Unremarkable. No pancreatic ductal dilatation or surrounding inflammatory changes. Spleen: Normal in size without significant abnormality. Adrenals/Urinary Tract: Adrenal glands are unremarkable. Kidneys are normal, without renal calculi, solid lesion, or hydronephrosis. Bladder  is unremarkable. Stomach/Bowel: Stomach is within normal limits. The terminal small bowel is distended, measuring up to 5.0 cm in caliber. There is a redemonstrated large, fungating mass of  the cecal base centered around the ileocecal valve, measuring at least 11.9 x 9.1 x 8.4 cm (series 10, image 50, series 7, image 71). Vascular/Lymphatic: Aortic atherosclerosis. Enlarged lymph nodes in the right lower quadrant mesocolon measuring up to 1.5 x 1.1 cm (series 10, image 46). Reproductive: No mass or other significant abnormality. Other: No abdominal wall hernia or abnormality. There has been interval placement of a right lower quadrant percutaneous pigtail drainage catheter within a retroperitoneal fluid collection, now almost completely resolved with no discretely visualized fluid remaining (series 10, image 42, 51). Trace ascites in the pelvis. Musculoskeletal: No acute or significant osseous findings. IMPRESSION: 1. There has been interval placement of a right lower quadrant percutaneous pigtail drainage catheter within a retroperitoneal fluid collection, now almost completely resolved with no discretely visualized fluid remaining. 2. There is a redemonstrated large, fungating mass of the cecal base centered around the ileocecal valve, measuring at least 11.9 x 9.1 x 8.4 cm. Enlarged lymph nodes in the right lower quadrant mesocolon measuring up to 1.5 x 1.1 cm. 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 3. The terminal small bowel is distended, measuring up to 5.0 cm in caliber. Findings are consistent with at partial small bowel obstruction due to obstipating mass. 4. Redemonstrated ill-defined hypodense lesion of the subcapsular inferior right lobe of the liver, hepatic segment VI, which has been biopsied to evaluate for metastatic disease. 5. Trace ascites in the pelvis. Aortic Atherosclerosis (ICD10-I70.0). Electronically Signed   By: Eddie Candle M.D.   On: 11/26/2020 08:33   CT ABDOMEN PELVIS W CONTRAST  Result Date: 11/15/2020 CLINICAL DATA:  Positive D-dimer, COVID-19 positive, tobacco abuse, right lower quadrant abdominal pain EXAM:  CT ANGIOGRAPHY CHEST CT ABDOMEN AND PELVIS WITH CONTRAST TECHNIQUE: Multidetector CT imaging of the chest was performed using the standard protocol during bolus administration of intravenous contrast. Multiplanar CT image reconstructions and MIPs were obtained to evaluate the vascular anatomy. Multidetector CT imaging of the abdomen and pelvis was performed using the standard protocol during bolus administration of intravenous contrast. CONTRAST:  80mL OMNIPAQUE IOHEXOL 350 MG/ML SOLN COMPARISON:  11/15/2020 FINDINGS: CTA CHEST FINDINGS Cardiovascular: This is a technically adequate evaluation of the pulmonary vasculature. No filling defects or pulmonary emboli. The heart is unremarkable without pericardial effusion. Mild atherosclerosis within the LAD distribution of the coronary vasculature. Normal caliber of the thoracic aorta without evidence of dissection. Mediastinum/Nodes: Mild wall thickening of the distal thoracic esophagus is nonspecific and could reflect esophagitis. Thyroid and trachea are unremarkable. No pathologic adenopathy. Lungs/Pleura: No acute airspace disease, effusion, or pneumothorax. Mild upper lobe predominant emphysema with areas of subpleural scarring. Central airways are patent. Musculoskeletal: No acute or destructive bony lesions. Reconstructed images demonstrate no additional findings. Review of the MIP images confirms the above findings. CT ABDOMEN and PELVIS FINDINGS Hepatobiliary: There is an indeterminate hypodensity within the inferior aspect right lobe liver, measuring approximately 3.5 x 2.5 cm reference image 36/4. No other focal liver abnormalities. The gallbladder is unremarkable. No intrahepatic duct dilation. Pancreas: Unremarkable. No pancreatic ductal dilatation or surrounding inflammatory changes. Spleen: Normal in size without focal abnormality. Adrenals/Urinary Tract: Adrenal glands are unremarkable. Kidneys are normal, without renal calculi, focal lesion, or  hydronephrosis. Bladder is unremarkable. Stomach/Bowel: There is masslike wall thickening involving the cecum,  extending approximately 9 cm in length. There is evidence of cecal perforation posteriorly, with a complex multiloculated retroperitoneal fluid collection. There are intramuscular components of the fluid collection within the right psoas and iliacus muscles, with a fluid collection extending into the proximal aspect of the iliopsoas tendon. This collection measures approximately 9.1 x 6.7 cm in transverse dimension, and extends approximately 18 cm in craniocaudal length. Differential diagnosis includes neoplasm or severe colitis. A normal gas-filled appendix is seen in the right lower quadrant. Vascular/Lymphatic: No discrete adenopathy within the abdomen or pelvis. Moderate atherosclerosis of the aorta. Reproductive: Prostate is unremarkable. Other: There is no free intraperitoneal fluid or free gas. No abdominal wall hernia. Musculoskeletal: No acute or destructive bony lesions. Reconstructed images demonstrate no additional findings. Review of the MIP images confirms the above findings. IMPRESSION: 1. Long segment masslike mural thickening of the cecum, concerning for neoplasm. 2. Posterior perforation at the site of cecal wall thickening, with multilocular right retroperitoneal abscess which extends into the right psoas and right iliacus muscles as above. 3. Indeterminate hypodensity inferior right lobe liver. Dedicated nonemergent outpatient liver MRI could be performed for definitive characterization. 4. No evidence of pulmonary embolus. 5. Mild wall thickening of the distal thoracic esophagus, which could reflect mild esophagitis. 6. Aortic Atherosclerosis (ICD10-I70.0) and Emphysema (ICD10-J43.9). Critical Value/emergent results were called by telephone at the time of interpretation on 11/15/2020 at 4:16 pm to provider [place], who verbally acknowledged these results. Electronically Signed   By:  Randa Ngo M.D.   On: 11/15/2020 16:22   US BIOPSY (LIVER)  Result Date: 11/20/2020 INDICATION: 50 year old gentleman with right liver mass/abscess presents to interventional radiology for ultrasound-guided biopsy EXAM: Ultrasound-guided biopsy of right liver lesion MEDICATIONS: None. ANESTHESIA/SEDATION: Moderate (conscious) sedation was employed during this procedure. A total of Versed 1 mg and Fentanyl 25 mcg was administered intravenously. Moderate Sedation Time: 15 minutes. The patient's level of consciousness and vital signs were monitored continuously by radiology nursing throughout the procedure under my direct supervision. COMPLICATIONS: None immediate. PROCEDURE: Informed written consent was obtained from the patient after a thorough discussion of the procedural risks, benefits and alternatives. All questions were addressed. Maximal Sterile Barrier Technique was utilized including caps, mask, sterile gowns, sterile gloves, sterile drape, hand hygiene and skin antiseptic. A timeout was performed prior to the initiation of the procedure. Patient positioned supine on the ultrasound table. Right upper quadrant skin prepped and draped in the usual fashion. Following local lidocaine administration, 17 gauge introducer needle was advanced into the right hepatic lobe, and five 18 gauge core were obtained utilizing continuous ultrasound guidance. One core sent in sterile saline for Gram stain and culture in the remaining sent in formalin for histologic analysis. Needle removed and hemostasis achieved with 5 minutes of manual compression. IMPRESSION: Ultrasound-guided biopsy of right liver lesion with sample sent for both pathology and Gram stain and culture. Electronically Signed   By: Miachel Roux M.D.   On: 11/20/2020 16:28   DG CHEST PORT 1 VIEW  Result Date: 12/12/2020 CLINICAL DATA:  Port-A-Cath placement EXAM: PORTABLE CHEST 1 VIEW COMPARISON:  11/15/2020 FINDINGS: Right subclavian Port-A-Cath has  been placed with the tip in the SVC. Right PICC line in place with the tip in the SVC. No pneumothorax. Heart is normal size. Lungs clear. No effusions or acute bony abnormality. IMPRESSION: Right Port-A-Cath and right PICC line tips are in the SVC. No pneumothorax. No acute cardiopulmonary disease. Electronically Signed   By: Rolm Baptise M.D.  On: 12/12/2020 11:05   DG Chest Portable 1 View  Result Date: 11/15/2020 CLINICAL DATA:  COVID EXAM: PORTABLE CHEST 1 VIEW COMPARISON:  None. FINDINGS: Evaluation is limited secondary to patient rotation. The cardiomediastinal silhouette is normal in contour. Blunting of the RIGHT costophrenic angle. No pneumothorax. No acute pleuroparenchymal abnormality. Visualized abdomen is unremarkable. No acute osseous abnormality noted. IMPRESSION: Blunting of the RIGHT costophrenic angle may reflect a small pleural effusion. Electronically Signed   By: Valentino Saxon MD   On: 11/15/2020 13:27   DG Abd 2 Views  Result Date: 12/07/2020 CLINICAL DATA:  Ileus. EXAM: ABDOMEN - 2 VIEW COMPARISON:  December 01, 2020. FINDINGS: Severely dilated small bowel loops are noted with air-fluid levels concerning for distal small bowel obstruction. No colonic dilatation is noted. Surgical drain is noted in right lower quadrant. Midline surgical staples are noted. IMPRESSION: Severely dilated small bowel loops are noted with air-fluid levels concerning for distal small bowel obstruction. Electronically Signed   By: Marijo Conception M.D.   On: 12/07/2020 19:19   DG Abd Portable 1V  Result Date: 12/07/2020 CLINICAL DATA:  NG tube placement EXAM: PORTABLE ABDOMEN - 1 VIEW COMPARISON:  None. FINDINGS: The bowel gas pattern is normal. Tip the NG tube is seen within the gastric fundus. A right-sided PICC is seen with the tip in the lower SVC. No radio-opaque calculi or other significant radiographic abnormality are seen. IMPRESSION: Tip the NG tube within the gastric fundus. Electronically  Signed   By: Prudencio Pair M.D.   On: 12/07/2020 20:54   DG Fluoro Guide CV Line-No Report  Result Date: 12/12/2020 Fluoroscopy was utilized by the requesting physician.  No radiographic interpretation.   CT IMAGE GUIDED DRAINAGE BY PERCUTANEOUS CATHETER  Result Date: 11/19/2020 INDICATION: 50 year old male with history of cecal perforation with fluid collection in the right retroperitoneum. Suspected underlying neoplastic etiology. EXAM: CT IMAGE GUIDED DRAINAGE BY PERCUTANEOUS CATHETER COMPARISON:  CT abdomen pelvis from 11/15/2020 MEDICATIONS: The patient is currently admitted to the hospital and receiving intravenous antibiotics. The antibiotics were administered within an appropriate time frame prior to the initiation of the procedure. ANESTHESIA/SEDATION: Moderate (conscious) sedation was employed during this procedure. A total of Versed 1 mg and Fentanyl 25 mcg was administered intravenously. Moderate Sedation Time: 21 minutes. The patient's level of consciousness and vital signs were monitored continuously by radiology nursing throughout the procedure under my direct supervision. CONTRAST:  None COMPLICATIONS: None immediate. PROCEDURE: Informed written consent was obtained from the patient after a discussion of the risks, benefits and alternatives to treatment. The patient was placed supine on the CT gantry and a pre procedural CT was performed re-demonstrating the known abscess/fluid collection within the right retroperitoneum. The procedure was planned. A timeout was performed prior to the initiation of the procedure. The right lower quadrant was prepped and draped in the usual sterile fashion. The overlying soft tissues were anesthetized with 1% lidocaine with epinephrine. Appropriate trajectory was planned with the use of a 22 gauge spinal needle. An 18 gauge trocar needle was advanced into the abscess/fluid collection and a short Amplatz super stiff wire was coiled within the collection.  Appropriate positioning was confirmed with a limited CT scan. The tract was serially dilated allowing placement of a 14 French all-purpose drainage catheter. Appropriate positioning was confirmed with a limited postprocedural CT scan. Approximately 400 mL ml of purulent fluid was aspirated. The tube was connected to a bulb suction and sutured in place. A dressing was placed.  The patient tolerated the procedure well without immediate post procedural complication. IMPRESSION: Successful CT guided placement of a 25 French all purpose drain catheter into the right retroperitoneum with aspiration of approximately 400 mL of purulent fluid. Samples were sent to the laboratory for culture as well as cytology given suspicion for underlying neoplastic etiology. Ruthann Cancer, MD [facility] Electronically Signed   By: Ruthann Cancer MD   On: 11/19/2020 08:01   [place] EKG SITE RITE  Result Date: 11/18/2020 If Site Rite image not attached, placement could not be confirmed due to current cardiac rhythm.      Subjective: Patient seen and examined.  Sitting comfortably on the bed.  No new complaints.  Seems motivated and wishes to go home today.  Remained afebrile.  No acute events overnight.  Discharge Exam: Vitals:   12/13/20 0450 12/13/20 0951  BP: 128/79 118/82  Pulse: 61 68  Resp: 17 17  Temp: 97.8 F (36.6 C) 98 F (36.7 C)  SpO2: 99% 100%   Vitals:   12/12/20 1507 12/12/20 2048 12/13/20 0450 12/13/20 0951  BP: 126/81 114/81 128/79 118/82  Pulse: 75 77 61 68  Resp: 17 17 17 17   Temp: (!) 97.4 F (36.3 C) 98.2 F (36.8 C) 97.8 F (36.6 C) 98 F (36.7 C)  TempSrc:  Oral Oral Oral  SpO2: 100% 98% 99% 100%  Weight:      Height:        General: Pt is alert, awake, not in acute distress, on room air, communicating well Cardiovascular: RRR, S1/S2 +, no rubs, no gallops Respiratory: CTA bilaterally, no wheezing, no rhonchi Abdominal: Soft,  NT, ND, bowel sounds + colostomy bag noted with yellow stool in the bag. Extremities: no edema, no cyanosis    The results of significant diagnostics from this hospitalization (including imaging, microbiology, ancillary and laboratory) are listed below for reference.     Microbiology: No results found for this or any previous visit (from the past 240 hour(s)).   Labs: BNP (last 3 results) No results for input(s): BNP in the last 8760 hours. Basic Metabolic Panel: Recent Labs  Lab 12/07/20 1145 12/08/20 0333 12/09/20 0311 12/12/20 0325 12/13/20 0336  NA 130* 132* 134* 133* 132*  K 4.5 4.3 4.0 4.4 4.1  CL 97* 96* 99 101 98  CO2 22 26 25 24 25   GLUCOSE 122* 148* 135* 101* 131*  BUN 23* 27* 31* 11 13  CREATININE 0.65 0.61 0.63 0.62 0.63  CALCIUM 9.4 9.2 9.0 9.1 9.1  MG  --  1.9 2.2 2.0 2.0  PHOS  --   --  4.5 5.3*  --    Liver Function Tests: Recent Labs  Lab 12/09/20 0311 12/12/20 0325  AST 20 18  ALT 35 31  ALKPHOS 279* 231*  BILITOT 0.5 0.7  PROT 6.4* 6.0*  ALBUMIN 2.6* 2.5*   No results for input(s): LIPASE, AMYLASE in the last 168 hours. No results for input(s): AMMONIA in the last 168 hours. CBC: Recent Labs  Lab 12/08/20 0333 12/09/20 0311 12/12/20 0325 12/13/20 0336  WBC 13.4* 9.5 6.2 9.0  NEUTROABS  --  6.2  --   --   HGB 9.5* 9.3* 8.4* 8.5*  HCT 31.5* 31.2* 28.3* 27.0*  MCV 79.5* 82.3 83.5 80.8  PLT 690* 667* 528* 554*   Cardiac Enzymes: No results for input(s): CKTOTAL, CKMB, CKMBINDEX, TROPONINI in the last 168 hours. BNP: Invalid input(s): POCBNP CBG: Recent Labs  Lab  12/10/20 1134 12/10/20 1748 12/10/20 1958 12/11/20 1009 12/11/20 2223  GLUCAP 113* 116* 102* 122* 111*   D-Dimer No results for input(s): DDIMER in the last 72 hours. Hgb A1c No results for input(s): HGBA1C in the last 72 hours. Lipid Profile No results for input(s): CHOL, HDL, LDLCALC, TRIG, CHOLHDL, LDLDIRECT in the last 72 hours. Thyroid function studies No  results for input(s): TSH, T4TOTAL, T3FREE, THYROIDAB in the last 72 hours.  Invalid input(s): FREET3 Anemia work up No results for input(s): VITAMINB12, FOLATE, FERRITIN, TIBC, IRON, RETICCTPCT in the last 72 hours. Urinalysis    Component Value Date/Time   COLORURINE YELLOW 11/16/2020 0015   APPEARANCEUR CLEAR 11/16/2020 0015   LABSPEC 1.044 (H) 11/16/2020 0015   PHURINE 5.0 11/16/2020 0015   GLUCOSEU NEGATIVE 11/16/2020 0015   HGBUR NEGATIVE 11/16/2020 0015   BILIRUBINUR NEGATIVE 11/16/2020 0015   KETONESUR NEGATIVE 11/16/2020 0015   PROTEINUR NEGATIVE 11/16/2020 0015   NITRITE NEGATIVE 11/16/2020 0015   LEUKOCYTESUR NEGATIVE 11/16/2020 0015   Sepsis Labs Invalid input(s): PROCALCITONIN,  WBC,  LACTICIDVEN Microbiology No results found for this or any previous visit (from the past 240 hour(s)).   Time coordinating discharge: Over 30 minutes  SIGNED:   Mckinley Jewel, MD  [facility] 12/13/2020, 10:01 AM Pager   If 7PM-7AM, please contact night-coverage [email or web address]

## 2020-12-13 NOTE — Progress Notes (Signed)
Patient is experiencing 10/10 abdominal pain and burping. 26ml yellow/brown emesis. V/s taken, Pulse: 91 BPM, BP 137/95, temp: 97.7, respiration rate: 20, SPO2 99. MD paged, awaiting order to remain admitted or d/c from unit. Patient stable, will continue to monitor and assess.

## 2020-12-13 NOTE — Discharge Instructions (Signed)
[facility]      [facility] [phone]  OPEN ABDOMINAL SURGERY: POST OP INSTRUCTIONS  Always review your discharge instruction sheet given to you by the facility where your surgery was performed.  IF YOU HAVE DISABILITY OR FAMILY LEAVE FORMS, YOU MUST BRING THEM TO THE OFFICE FOR PROCESSING.  PLEASE DO NOT GIVE THEM TO YOUR DOCTOR.  1. A prescription for pain medication may be given to you upon discharge.  Take your pain medication as prescribed, if needed.  If narcotic pain medicine is not needed, then you may take acetaminophen (Tylenol) or ibuprofen (Advil) as needed. 2. Take your usually prescribed medications unless otherwise directed. 3. If you need a refill on your pain medication, please contact your pharmacy. They will contact our office to request authorization.  Prescriptions will not be filled after 5pm or on week-ends. 4. You should follow a light diet the first few days after arrival home, such as soup and crackers, pudding, etc.unless your doctor has advised otherwise. A high-fiber, low fat diet can be resumed as tolerated.   Be sure to include lots of fluids daily. Most patients will experience some swelling and bruising on the chest and neck area.  Ice packs will help.  Swelling and bruising can take several days to resolve 5. Most patients will experience some swelling and bruising in the area of the incision. Ice pack will help. Swelling and bruising can take several days to resolve..  6. It is common to experience some constipation if taking pain medication after surgery.  Increasing fluid intake and taking a stool softener will usually help or prevent this problem from occurring.  A mild laxative (Milk of Magnesia or Miralax) should be taken according to package directions if there are no bowel movements after 48 hours. 7.  You may have steri-strips (small skin tapes) in place directly over the incision.  These strips should be left on the skin for 7-10 days.  If your  surgeon used skin glue on the incision, you may shower in 24 hours.  The glue will flake off over the next 2-3 weeks.  Any sutures or staples will be removed at the office during your follow-up visit. You may find that a light gauze bandage over your incision may keep your staples from being rubbed or pulled. You may shower and replace the bandage daily. 8. ACTIVITIES:  You may resume regular (light) daily activities beginning the next day--such as daily self-care, walking, climbing stairs--gradually increasing activities as tolerated.  You may have sexual intercourse when it is comfortable.  Refrain from any heavy lifting or straining until approved by your doctor. a. You may drive when you no longer are taking prescription pain medication, you can comfortably wear a seatbelt, and you can safely maneuver your car and apply brakes b. Return to Work: ___________________________________ 9. You should see your doctor in the office for a follow-up appointment approximately two weeks after your surgery.  Make sure that you call for this appointment within a day or two after you arrive home to insure a convenient appointment time. OTHER INSTRUCTIONS:  _____________________________________________________________ _____________________________________________________________  WHEN TO CALL YOUR DOCTOR: 1. Fever over 101.0 2. Inability to urinate 3. Nausea and/or vomiting 4. Extreme swelling or bruising 5. Continued bleeding from incision. 6. Increased pain, redness, or drainage from the incision. 7. Difficulty swallowing or breathing 8. Muscle cramping or spasms. 9. Numbness or tingling in hands or feet or around lips.  The clinic staff is available to   answer your questions during regular business hours.  Please don't hesitate to call and ask to speak to one of the nurses if you have concerns.  For further questions, please visit [email or web address]   Ileostomy Home Guide  An ileostomy  is a surgical procedure to make an opening (stoma) for stool to leave your body. The surgery is done when a medical condition prevents stool from passing through the intestines and leaving your body through the rectum. During the surgery, a part of the small intestine (ileum) is attached to the stoma made in the abdominal wall. A bag or pouch is fitted over the stoma. Stool and gas will collect in the pouch. After having this surgery, you will need to empty and change your ileostomy pouch as needed. You will also need to take steps to care for the stoma. What are the risks? Risks include:  Skin irritation around the stoma.  Leaks in the pouching system.  Narrowing (constriction) of the stoma if the barrier is cut smaller than the stoma. Supplies needed:  One- or two-piece pouching system.  Curved scissors to cut the ostomy barrier to fit your stoma, if needed.  Ostomy belt, if needed.  Any other items your health care provider has recommended, such as ostomy powder, paste, or skin barrier film.  Soft washcloth or soft paper towel. How do I care for my stoma? Your stoma should look pink and moist. At first, the stoma may be swollen, but this swelling will go away within 6 weeks. To care for the stoma:  Keep the skin around the stoma clean and dry.  Use a clean, soft washcloth or soft paper towel to gently wash the stoma and the skin around it with warm water.  Use stoma powder or paste on your skin only as told by your health care provider.  If your skin becomes irritated, change your ileostomy pouch. The irritation may indicate that the pouch is leaking.  Measure the stoma opening regularly and record the size. Watch for changes. Share the information with your health care provider.  Check your stoma area every day for signs of infection. Check for: ? More redness, swelling, or pain. ? More fluid or blood. ? Pus or warmth. How do I care for my ileostomy pouch? The pouch that  fits over the stoma can have either one or two pieces.  One-piece pouch: The skin barrier and the pouch are combined in one unit.  Two-piece pouch: The skin barrier and the pouch are separate pieces that attach to each other. Empty your pouch when it is one-third to one-half full. Do not let more stool or gas build up. This could cause the pouch to leak. Some pouches have a built-in gas release valve. Change your pouch every 3-4 days for the first 6 weeks, and then every 5-7 days after that. You should also change the pouch right away if the skin near the stoma looks irritated. How do I empty my ileostomy pouch? You will be taught how to empty your pouch before you leave the hospital. You may be told to: 1. Wash your hands with soap and water. If soap and water are not available, use hand sanitizer. 2. Sit far back on the toilet. 3. Put pieces of toilet paper into the toilet water. This will prevent splashing as you empty the stool into the toilet bowl. 4. Unclip the tail end of the pouch or open it with the non-removable system that stays attached at the end  of the pouch. 5. Unroll the tail of the pouch and empty the stool into the toilet. 6. Use toilet paper to clean the end of the pouch where the stool drained out. 7. Reroll the tail, and attach the clip or fastener that attaches the pieces together. 8. Wash your hands again. How do I change my ileostomy pouch? You will be taught how to change the pouch before you leave the hospital. You may be told to: 1. Lay out your supplies. 2. Wash your hands with soap and water. If soap and water are not available, use hand sanitizer. 3. Carefully remove the old pouch. 4. Wash the stoma and the skin around the stoma. Allow the area to dry. Men may be told to carefully shave any hair around the stoma. 5. Use the stoma measuring guide that comes with your pouch to decide what size hole you will need to cut in the skin barrier piece. Choose the smallest  possible size that will go around the stoma but will not touch it. 6. Use the guide to trace the circle on the back of the skin barrier piece. 7. Cut out the hole. 8. Hold the skin barrier piece over the stoma to make sure the hole is the correct size. 9. Remove the adhesive paper backing from the skin barrier piece. 10. If instructed by your health care provider, squeeze stoma paste around the opening of the skin barrier piece. 11. Clean and dry the skin around the stoma again. 12. Carefully fit the skin barrier piece over your stoma. 13. If you are using a two-piece pouch, snap the pouch onto the skin barrier piece. 14. Close the tail of the pouch. 15. Put your hand over the top of the skin barrier piece to help warm it for about 5 minutes. This helps it conform to your body better. 71. Wash your hands again.   What are some general tips?  Avoid wearing tight clothing over your stoma.  You can shower with or without the pouch in place. Do not use harsh or oily soaps or lotions. Always keep the pouch on if you are taking a bath or swimming.  If your pouch gets wet, you can dry it with a hair dryer on the cool setting.  Whenever you leave home, take extra clothing and ostomy supplies with you, including a skin barrier and pouch.  Store all supplies in a cool, dry place. Do not leave supplies in extreme heat as this can melt the parts.  Return to your normal activities as told by your health care provider. Ask your health care provider what activities are safe for you. Where to find more information  [place]: www.ostomy.org Contact a health care provider if:  You have more redness, swelling, or pain around your stoma.  You have more fluid or blood coming from your stoma.  Your stoma feels warm to the touch.  You have pus coming from your stoma.  Your stoma extends in or out farther than normal.  Your stoma becomes purple, black, or pale white.  You  need to change the pouch every day.  You have a fever.  You have abdominal pain, nausea, or bloating.  No stool is passing from the stoma.  You have diarrhea, requiring you to empty the pouch more frequently than normal. Get help right away if:  Your stool is bloody.  You vomit.  You have trouble breathing.  You feel dizzy or light-headed. Summary  You will need to empty and change your ileostomy pouch as told by your health care provider.  You will need to take steps to care for the stoma.  Contact a health care provider if you need to change the pouch every day, have abdominal pain, nausea, or bloating, or if you have diarrhea, requiring you to empty the pouch more frequently than normal.  Get help right away if your stool is bloody. This information is not intended to replace advice given to you by your health care provider. Make sure you discuss any questions you have with your health care provider. Document Revised: 12/01/2019 Document Reviewed: 09/01/2018 Elsevier Patient Education  2021 [place] After Please monitor the output of your ileostomy. Call the office if it is greater than 1037ml/24 hours This sheet gives you information about how to care for yourself after your procedure. Your health care provider may also give you more specific instructions. If you have problems or questions, contact your health care provider. What can I expect after the procedure? After the procedure, it is common to have:  A small amount of blood or clear fluid leaking from your stoma.  Pain and discomfort in your abdomen, especially around your stoma.  Irregular bowel movements for several days.  Loose stool. Follow these instructions at home: Medicines  Take over-the-counter and prescription medicines only as told by your health care provider.  If you were prescribed an antibiotic medicine, take it as told by your health care provider. Do not stop taking  the antibiotic even if you start to feel better. Stoma and incision care  Keep the skin that surrounds your stoma clean and dry.  Follow instructions from your health care provider about how to take care of your incision. Make sure you: ? Wash your hands with soap and water before and after you change your bandage (dressing). If soap and water are not available, use hand sanitizer. ? Change your dressing as told by your health care provider. ? Leave stitches (sutures), skin glue, or adhesive strips in place. These skin closures may need to stay in place for 2 weeks or longer. If adhesive strip edges start to loosen and curl up, you may trim the loose edges. Do not remove adhesive strips completely unless your health care provider tells you to do that.  Check your stoma area every day for signs of infection. Check for: ? More redness, swelling, or pain. ? More fluid or blood. ? Warmth. ? Pus or a bad smell.  Follow your health care provider's instructions about changing and cleaning your ostomy pouch.  Keep supplies with you at all times to care for your stoma and ostomy pouch. Also keep extra clothing with you.   Eating and drinking  Follow instructions from your health care provider about eating or drinking restrictions.  Pay attention to which foods and drinks cause problems with digestion, such as gas, constipation, or diarrhea.  Avoid spicy foods and caffeine while your stoma heals.  Eat meals and snacks at regular intervals.  Drink enough fluid to keep your urine pale yellow.   Activity  Return to your normal activities as told by your health care provider. Ask your health care provider what activities are safe for you.  Rest as much as possible while your stoma heals.  Avoid intense physical activity for as long as you are told by your health care provider.  Do not lift anything that is heavier than 10 lb (  4.5 kg), or the limit that you are told, for 6 weeks or until your  health care provider says that it is safe.   General instructions  Do not drive or use heavy machinery while taking prescription pain medicine.  Wear compression stockings as told by your health care provider. These stockings help to prevent blood clots and reduce swelling in your legs.  Do not take baths, swim, or use a hot tub until your health care provider approves. Ask your health care provider if you may take showers.  Do not use any products that contain nicotine or tobacco, such as cigarettes, e-cigarettes, and chewing tobacco. These can delay incision healing after surgery. If you need help quitting, ask your health care provider.  (Women) Talk with your health care provider if you plan to become pregnant or if you take birth control pills.  Keep all follow-up visits as told by your health care provider. This is important. Contact a health care provider if:  You have more redness, swelling, or pain at or around your stoma.  You have more fluid or blood coming from your stoma.  Your stoma feels warm to the touch.  You have pus or a bad smell coming from your stoma.  You have a fever.  You have loose stools that do not become thicker after several weeks.  You have bowel movements more often or less often than your health care provider tells you to expect.  You feel nauseous.  You vomit.  You have abdominal pain, bloating, pressure, or cramping.  You have problems with sexual activity.  You have an unusual lack of energy (fatigue).  You are unusually thirsty or you always have a dry mouth. Get help right away if:  You feel dizzy or light-headed.  You have pain or cramps in your abdomen that get worse or do not go away with medicine.  Your stoma suddenly changes size or color.  You have shortness of breath.  You have bleeding from your stoma that does not stop.  You vomit more than one time.  You faint.  You have internal tissue coming out of your stoma  (prolapse).  You have an irregular heartbeat.  You have chest pain. Summary  Take over-the-counter and prescription medicines only as told by your health care provider.  Follow your health care provider's instructions about how to take care of your incision and stoma.  Follow instructions from your health care provider about eating or drinking restrictions.  Do not take baths, swim, or use a hot tub until your health care provider approves. Ask your health care provider if you may take showers.  Contact a health care provider if you have more redness, swelling, or pain at or around your stoma. This information is not intended to replace advice given to you by your health care provider. Make sure you discuss any questions you have with your health care provider. Document Revised: 06/06/2018 Document Reviewed: 06/06/2018 Elsevier Patient Education  2021 [place] A soft-food eating plan includes foods that are safe and easy to chew and swallow. Your health care provider or dietitian can help you find foods and flavors that fit into this plan. Follow this plan until your health care provider or dietitian says it is safe to start eating other foods and food textures. What are tips for following this plan? General guidelines  Take small bites of food, or cut food into pieces about  inch or smaller.  Bite-sized pieces of food are easier to chew and swallow.  Eat moist foods. Avoid overly dry foods.  Avoid foods that: ? Are difficult to swallow, such as dry, chunky, crispy, or sticky foods. ? Are difficult to chew, such as hard, tough, or stringy foods. ? Contain nuts, seeds, or fruits.  Follow instructions from your dietitian about the types of liquids that are safe for you to swallow. You may be allowed to have: ? Thick liquids only. This includes only liquids that are thicker than honey. ? Thin and thick liquids. This includes all beverages and foods that  become liquid at room temperature.  To make thick liquids: ? Purchase a commercial liquid thickening powder. These are available at grocery stores and pharmacies. ? Mix the thickener into liquids according to instructions on the label. ? Purchase ready-made thickened liquids. ? Thicken soup by pureeing, straining to remove chunks, and adding flour, potato flakes, or corn starch. ? Add commercial thickener to foods that become liquid at room temperature, such as milk shakes, yogurt, ice cream, gelatin, and sherbet.  Ask your health care provider whether you need to take a fiber supplement.   Cooking  Cook meats so they stay tender and moist. Use methods like braising, stewing, or baking in liquid.  Cook vegetables and fruit until they are soft enough to be mashed with a fork.  Peel soft, fresh fruits such as peaches, nectarines, and melons.  When making soup, make sure chunks of meat and vegetables are smaller than  inch.  Reheat leftover foods slowly so that a tough crust does not form. What foods are allowed? The items listed below may not be a complete list. Talk with your dietitian about what dietary choices are best for you. Grains Breads, muffins, pancakes, or waffles moistened with syrup, jelly, or butter. Dry cereals well-moistened with milk. Moist, cooked cereals. Well-cooked pasta and rice. Vegetables All soft-cooked vegetables. Shredded lettuce. Fruits All canned and cooked fruits. Soft, peeled fresh fruits. Strawberries. Dairy Milk. Cream. Yogurt. Cottage cheese. Soft cheese without the rind. Meats and other protein foods Tender, moist ground meat, poultry, or fish. Meat cooked in gravy or sauces. Eggs. Sweets and desserts Ice cream. Milk shakes. Sherbet. Pudding. Fats and oils Butter. Margarine. Olive, canola, sunflower, and grapeseed oil. Smooth salad dressing. Smooth cream cheese. Mayonnaise. Gravy. What foods are not allowed? The items listed bemay not be a  complete list. Talk with your dietitian about what dietary choices are best for you. Grains Coarse or dry cereals, such as bran, granola, and shredded wheat. Tough or chewy crusty breads, such as [place] bread or baguettes. Breads with nuts, seeds, or fruit. Vegetables All raw vegetables. Cooked corn. Cooked vegetables that are tough or stringy. Tough, crisp, fried potatoes and potato skins. Fruits Fresh fruits with skins or seeds, or both, such as apples, pears, and grapes. Stringy, high-pulp fruits, such as papaya, pineapple, coconut, and mango. Fruit leather and all dried fruit. Dairy Yogurt with nuts or coconut. Meats and other protein foods Hard, dry sausages. Dry meat, poultry, or fish. Meats with gristle. Fish with bones. Fried meat or fish. Lunch meat and hotdogs. Nuts and seeds. Chunky peanut butter or other nut butters. Sweets and desserts Cakes or cookies that are very dry or chewy. Desserts with dried fruit, nuts, or coconut. Fried pastries. Very rich pastries. Fats and oils Cream cheese with fruit or nuts. Salad dressings with seeds or chunks. Summary  A soft-food eating plan includes foods that are safe  and easy to swallow. Generally, the foods should be soft enough to be mashed with a fork.  Avoid foods that are dry, hard to chew, crunchy, sticky, stringy, or crispy.  Ask your health care provider whether you need to thicken your liquids and if you need to take a fiber supplement. This information is not intended to replace advice given to you by your health care provider. Make sure you discuss any questions you have with your health care provider. Document Revised: 01/19/2019 Document Reviewed: 12/01/2016 Elsevier Patient Education  2021 [employer].

## 2020-12-13 NOTE — Progress Notes (Signed)
Patient given ostomy teaching and discharge instructions and stated understanding.  Patient is getting med's from [facility] and waiting for sister to return with his money. Patient said his sister is getting his ostomy supply's and will verify when she arrives.

## 2020-12-13 NOTE — Progress Notes (Signed)
PT Cancellation Note  Patient Details Name: Eric [name] MRN: [id number] DOB: Aug 29, 1971   Cancelled Treatment:    Reason Eval/Treat Not Completed: Other (comment).  Pt is declining PT today even for HEP in bed due to nausea and vomiting.  Follow up as time and pt allow.   Ramond Dial 12/13/2020, 2:42 PM Mee Hives, PT MS Acute Rehab Dept. Number: [place] and [place]

## 2020-12-14 MED ORDER — OXYCODONE HCL 5 MG PO TABS
15.0000 mg | ORAL_TABLET | ORAL | Status: DC | PRN
  Administered 2020-12-14 – 2020-12-15 (×5): 15 mg via ORAL
  Filled 2020-12-14 (×5): qty 3

## 2020-12-14 NOTE — Progress Notes (Signed)
[facility] Progress Note  2 Days Post-Op  Subjective: CC-  No more n/v since yesterday. Ileostomy productive.   Objective: Vital signs in last 24 hours: Temp:  [97.7 F (36.5 C)-98 F (36.7 C)] 98 F (36.7 C) (03/05 0506) Pulse Rate:  [63-72] 72 (03/05 0506) Resp:  [16-18] 18 (03/05 0506) BP: (114-130)/(75-85) 130/84 (03/05 0506) SpO2:  [99 %-100 %] 99 % (03/05 0506) Last BM Date: 12/12/20  Intake/Output from previous day: 03/04 0701 - 03/05 0700 In: 749.4 [P.O.:660; I.V.:89.4] Out: 4525 [Urine:2725; Emesis/NG output:200; KGMWN:0272] Intake/Output this shift: No intake/output data recorded.  PE: Gen: alert, NAD Lungs:normal rate and effort. R chest wall with port in place. Incision c/d/i Abd: Soft,NT, ND, +BS, ileostomy in R hemiabdomen - edematous and viable -airand semi-liquid stoolin pouch.1200cc/24 hours documented.Midline wound with some scabbing but no erythema or drainage  Lab Results:  Recent Labs    12/12/20 0325 12/13/20 0336  WBC 6.2 9.0  HGB 8.4* 8.5*  HCT 28.3* 27.0*  PLT 528* 554*   BMET Recent Labs    12/12/20 0325 12/13/20 0336  NA 133* 132*  K 4.4 4.1  CL 101 98  CO2 24 25  GLUCOSE 101* 131*  BUN 11 13  CREATININE 0.62 0.63  CALCIUM 9.1 9.1   PT/INR No results for input(s): LABPROT, INR in the last 72 hours. CMP     Component Value Date/Time   NA 132 (L) 12/13/2020 0336   K 4.1 12/13/2020 0336   CL 98 12/13/2020 0336   CO2 25 12/13/2020 0336   GLUCOSE 131 (H) 12/13/2020 0336   BUN 13 12/13/2020 0336   CREATININE 0.63 12/13/2020 0336   CALCIUM 9.1 12/13/2020 0336   PROT 6.0 (L) 12/12/2020 0325   ALBUMIN 2.5 (L) 12/12/2020 0325   AST 18 12/12/2020 0325   ALT 31 12/12/2020 0325   ALKPHOS 231 (H) 12/12/2020 0325   BILITOT 0.7 12/12/2020 0325   GFRNONAA >60 12/13/2020 0336   Lipase  No results found for: LIPASE     Studies/Results: DG CHEST PORT 1 VIEW  Result Date: 12/12/2020 CLINICAL DATA:   Port-A-Cath placement EXAM: PORTABLE CHEST 1 VIEW COMPARISON:  11/15/2020 FINDINGS: Right subclavian Port-A-Cath has been placed with the tip in the SVC. Right PICC line in place with the tip in the SVC. No pneumothorax. Heart is normal size. Lungs clear. No effusions or acute bony abnormality. IMPRESSION: Right Port-A-Cath and right PICC line tips are in the SVC. No pneumothorax. No acute cardiopulmonary disease. Electronically Signed   By: Rolm Baptise M.D.   On: 12/12/2020 11:05   DG ABD ACUTE 2+V W 1V CHEST  Result Date: 12/13/2020 CLINICAL DATA:  Vomiting and distension.  History of colectomy. EXAM: DG ABDOMEN ACUTE WITH 1 VIEW CHEST COMPARISON:  Chest radiograph 12/07/2020 and CT from 12/09/2020 FINDINGS: Right subclavian Port-A-Cath with the tip in the SVC. There is also a right arm PICC line with the tip in the SVC region. Negative for pneumothorax. The lungs are clear. Heart size is normal. Dilated loops of small bowel particularly in the left upper abdomen. Loop of small bowel in the left upper abdomen measures up to 6.4 cm. Small bowel distension has progressed since the recent CT. Patient has right-sided ileostomy. IMPRESSION: 1. Increased small bowel distension particularly in the left abdomen. Differential diagnosis includes small bowel obstruction versus worsening ileus. 2. No acute chest abnormality. These results will be called to the ordering clinician or representative by the Radiologist Assistant, and communication  documented in the PACS or [employer]. Electronically Signed   By: Markus Daft M.D.   On: 12/13/2020 15:44   DG Fluoro Guide CV Line-No Report  Result Date: 12/12/2020 Fluoroscopy was utilized by the requesting physician.  No radiographic interpretation.    Anti-infectives: Anti-infectives (From admission, onward)   Start     Dose/Rate Route Frequency Ordered Stop   11/30/20 1045  ceFAZolin (ANCEF) IVPB 2g/100 mL premix  Status:  Discontinued        2 g 200 mL/hr over  30 Minutes Intravenous On call to O.R. 11/30/20 0957 11/30/20 1043   11/16/20 1000  remdesivir 100 mg in sodium chloride 0.9 % 100 mL IVPB       "Followed by" Linked Group Details   100 mg 200 mL/hr over 30 Minutes Intravenous Daily 11/15/20 2135 11/17/20 1002   11/15/20 2230  piperacillin-tazobactam (ZOSYN) IVPB 3.375 g        3.375 g 12.5 mL/hr over 240 Minutes Intravenous Every 8 hours 11/15/20 2144 12/03/20 2359   11/15/20 2134  remdesivir 200 mg in sodium chloride 0.9% 250 mL IVPB       "Followed by" Linked Group Details   200 mg 580 mL/hr over 30 Minutes Intravenous Once 11/15/20 2135 11/16/20 0029   11/15/20 1700  piperacillin-tazobactam (ZOSYN) IVPB 3.375 g        3.375 g 100 mL/hr over 30 Minutes Intravenous  Once 11/15/20 1657 11/15/20 1826       Assessment/Plan Anemia- hgb 8.5 (3/4) Hyponatremia Tobacco abuse COVID-19positive - off isolation 2/15 Severe malnutrition - prealbumin 5.8 (2/8), now 33.1. TPNweaned off.  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 Rosendo Gros; Dr. Abner Greenspan) - S/p Portacath insertion - Dr. [name] - 12/12/2020 - POD #16 and 2 respectively  - CXR post op without PTX - AFVSS.  - S/p IR drain placement 2/7 - this drain was removed intra-operatively and new blake drain placed; IR cx Enterobacter aerogenes. IV Zosyn completed 2/22 at [facility]. Surgical Drain removed 3/1 - Continue to mobilize and pulm toilet - Was set to discharge 3/4 then developed n/v. Xray showed some distended loops of small bowel. Suspect food bolus as he does not have his teeth and admits to eating large pieces of solid food without chewing - Will trial clear liquids this morning, if he tolerates ok to advance to soft diet. Discussed with patient keeping on more liquid/puree diet until he is home and can use his dentures to help  chew solid food. May be ready for discharge tomorrow if he tolerates this. - Monitor ileostomy output. On fiber - continue. Goal output < 1L. We discussed monitoring output at home.  ID -zosyn 2/4-2/22. None currently.  FEN - CLD>> soft diet VTE: SCDs,SQ heparin Foley -D/C-ed 2/18. Voiding.  Follow up - oncology (Dr. Benay Spice), Dr. Rosendo Gros   LOS: 36 days    Wellington Hampshire, [facility] 12/14/2020, 8:39 AM Please see Amion for pager number during day hours 7:00am-4:30pm

## 2020-12-14 NOTE — Progress Notes (Signed)
PROGRESS NOTE    Eric [name]  EAV:[id number] DOB: 11-Aug-1971 DOA: 11/15/2020 PCP: [facility]   Brief Narrative:  50 year old male with no known PMH presented with right groin pain found to have retroperitoneal abscess with bowel perforation cecal mass,incidental Covid positive seen by surgery placed on TPN for severe protein calorie malnutrition and had IR drain the abscess.Patient underwent right hemicolectomy with ileostomy 2/17.  Assessment & Plan:  Perforated Stage IV colon cancer with RTP abscess s/p right colectomy/ileostomy 11/28/20, s/p cystoscopy and ureteral stent, with liver mets -Complicated with postop ileus: -POD #16 -Post-op course complicated by ileus, NGT was placed 2/20.   -2/22-attempted NG clamping but he vomited.   -2/25-soft diet started -2/26 patient feeling nauseous also vomited, x-ray abdomen showed severely dilated small bowel loops concerning for distal small bowel obstruction-NG tube was placed back in. -now off NG tube.  He started tolerating soft diet.  TPN discontinued.  Plan was to discharge patient on 3/4 however he vomited again and started having severe abdominal pain.  X-ray abdomen  revealed SBO versus early ileus.  Patient kept n.p.o. -3/5: Patient symptoms improved-started on clear liquid-if he tolerates-okay to advance to soft diet as per surgery  -Continue as needed pain medication - Oncology to start chemo once he is recovered from the surgery. -Underwent Port-A-Cath placement today 3/3  Sepsis POA 2/2 perforated colon mass: -Sepsis resolved. -Status post drain placement on 2/7-and removed on 3/1 -culture grew Enterobacter errogeneous.  Remained afebrile with no leukocytosis. -Completed abx Zosyn- from 2/4 to 2/22.  Blood loss anemia/microcytic anemia  -with hemoglobin 5.4 g on admission 2/2 chronic GI bleed from colon cancer. -S/P PRBC x4 units -Monitor H&H closely and transfuse if hemoglobin less than  7.  Deconditionind-ambulating well.  Moderate protein calorie malnutrition:  Was on TPN.  He started tolerating diet-TPN discontinued.  Thrombocytosis likely reactive. Downtrending.  Hyponatremia: -Mild.  Sodium 132.  Hyperglycemic-likely reactive. A1c is normal.  COVID-19 virus infection positive incidental/asymptomatic s/px3 days remdesivir,off isolation 2/15.  Tobacco abuse: Counseled to quit -Nicotine patch  DVT prophylaxis: Heparin  code Status: Full code Family Communication:  None present at bedside.  Plan of care discussed with patient in length and he verbalized understanding and agreed with it. Disposition Plan: To be determined  Consultants:   General surgery  Oncology  Procedures:  See note Antimicrobials:   See note  Status is: Inpatient   Dispo: The patient is from: Home              Anticipated d/c is to: Home              Patient currently is not medically stable to d/c.   Difficult to place patient No    Subjective: Patient seen and examined.  Reports improvement in pain and had no vomiting since yesterday.  Requested to go up on oxycodone to 15 mg.  Remained afebrile.  No acute events overnight.  Objective: Vitals:   12/13/20 0951 12/13/20 1449 12/13/20 2054 12/14/20 0506  BP: 118/82 114/85 119/75 130/84  Pulse: 68 72 63 72  Resp: 17 18 16 18   Temp: 98 F (36.7 C) 97.7 F (36.5 C) 97.9 F (36.6 C) 98 F (36.7 C)  TempSrc: Oral Oral Oral Oral  SpO2: 100% 100% 99% 99%  Weight:      Height:        Intake/Output Summary (Last 24 hours) at 12/14/2020 1045 Last data filed at 12/14/2020 0700 Gross per 24 hour  Intake 569.42 ml  Output 4325 ml  Net -3755.58 ml   Filed Weights   11/15/20 2000 12/12/20 0808  Weight: 69.4 kg 69.4 kg    Examination:  General exam: Appears calm and comfortable, on room air, communicating well, appears chronically ill Respiratory system: Clear to auscultation. Respiratory effort normal. Cardiovascular  system: S1 & S2 heard, RRR. No JVD, murmurs, rubs, gallops or clicks. No pedal edema.  Port-A-Cath noted on right upper chest.  Sutures intact. Gastrointestinal system: Abdomen is nondistended, soft and nontender. Normal bowel sounds heard.  Ileostomy present with yellowish stool in the bag.  Midline abdominal wound-no erythema or drainage noted.   Central nervous system: Alert and oriented. No focal neurological deficits. Extremities: Symmetric 5 x 5 power. Skin: No rashes, lesions or ulcers Psychiatry: Judgement and insight appear normal. Mood & affect appropriate.    Data Reviewed: I have personally reviewed following labs and imaging studies  CBC: Recent Labs  Lab 12/08/20 0333 12/09/20 0311 12/12/20 0325 12/13/20 0336  WBC 13.4* 9.5 6.2 9.0  NEUTROABS  --  6.2  --   --   HGB 9.5* 9.3* 8.4* 8.5*  HCT 31.5* 31.2* 28.3* 27.0*  MCV 79.5* 82.3 83.5 80.8  PLT 690* 667* 528* 554*   Basic Metabolic Panel: Recent Labs  Lab 12/07/20 1145 12/08/20 0333 12/09/20 0311 12/12/20 0325 12/13/20 0336  NA 130* 132* 134* 133* 132*  K 4.5 4.3 4.0 4.4 4.1  CL 97* 96* 99 101 98  CO2 22 26 25 24 25   GLUCOSE 122* 148* 135* 101* 131*  BUN 23* 27* 31* 11 13  CREATININE 0.65 0.61 0.63 0.62 0.63  CALCIUM 9.4 9.2 9.0 9.1 9.1  MG  --  1.9 2.2 2.0 2.0  PHOS  --   --  4.5 5.3*  --    GFR: Estimated Creatinine Clearance: 107.2 mL/min (by C-G formula based on SCr of 0.63 mg/dL). Liver Function Tests: Recent Labs  Lab 12/09/20 0311 12/12/20 0325  AST 20 18  ALT 35 31  ALKPHOS 279* 231*  BILITOT 0.5 0.7  PROT 6.4* 6.0*  ALBUMIN 2.6* 2.5*   No results for input(s): LIPASE, AMYLASE in the last 168 hours. No results for input(s): AMMONIA in the last 168 hours. Coagulation Profile: No results for input(s): INR, PROTIME in the last 168 hours. Cardiac Enzymes: No results for input(s): CKTOTAL, CKMB, CKMBINDEX, TROPONINI in the last 168 hours. BNP (last 3 results) No results for input(s):  PROBNP in the last 8760 hours. HbA1C: No results for input(s): HGBA1C in the last 72 hours. CBG: Recent Labs  Lab 12/10/20 1134 12/10/20 1748 12/10/20 1958 12/11/20 1009 12/11/20 2223  GLUCAP 113* 116* 102* 122* 111*   Lipid Profile: No results for input(s): CHOL, HDL, LDLCALC, TRIG, CHOLHDL, LDLDIRECT in the last 72 hours. Thyroid Function Tests: No results for input(s): TSH, T4TOTAL, FREET4, T3FREE, THYROIDAB in the last 72 hours. Anemia Panel: No results for input(s): VITAMINB12, FOLATE, FERRITIN, TIBC, IRON, RETICCTPCT in the last 72 hours. Sepsis Labs: No results for input(s): PROCALCITON, LATICACIDVEN in the last 168 hours.  No results found for this or any previous visit (from the past 240 hour(s)).    Radiology Studies: DG CHEST PORT 1 VIEW  Result Date: 12/12/2020 CLINICAL DATA:  Port-A-Cath placement EXAM: PORTABLE CHEST 1 VIEW COMPARISON:  11/15/2020 FINDINGS: Right subclavian Port-A-Cath has been placed with the tip in the SVC. Right PICC line in place with the tip in the SVC. No pneumothorax. Heart is normal  size. Lungs clear. No effusions or acute bony abnormality. IMPRESSION: Right Port-A-Cath and right PICC line tips are in the SVC. No pneumothorax. No acute cardiopulmonary disease. Electronically Signed   By: Charlett Nose M.D.   On: 12/12/2020 11:05   DG ABD ACUTE 2+V W 1V CHEST  Result Date: 12/13/2020 CLINICAL DATA:  Vomiting and distension.  History of colectomy. EXAM: DG ABDOMEN ACUTE WITH 1 VIEW CHEST COMPARISON:  Chest radiograph 12/07/2020 and CT from 12/09/2020 FINDINGS: Right subclavian Port-A-Cath with the tip in the SVC. There is also a right arm PICC line with the tip in the SVC region. Negative for pneumothorax. The lungs are clear. Heart size is normal. Dilated loops of small bowel particularly in the left upper abdomen. Loop of small bowel in the left upper abdomen measures up to 6.4 cm. Small bowel distension has progressed since the recent CT. Patient  has right-sided ileostomy. IMPRESSION: 1. Increased small bowel distension particularly in the left abdomen. Differential diagnosis includes small bowel obstruction versus worsening ileus. 2. No acute chest abnormality. These results will be called to the ordering clinician or representative by the Radiologist Assistant, and communication documented in the PACS or [employer]. Electronically Signed   By: Richarda Overlie M.D.   On: 12/13/2020 15:44    Scheduled Meds: . acetaminophen  1,000 mg Oral Q6H  . Chlorhexidine Gluconate Cloth  6 each Topical Daily  . feeding supplement  237 mL Oral TID BM  . heparin injection (subcutaneous)  5,000 Units Subcutaneous Q8H  . influenza vac split quadrivalent PF  0.5 mL Intramuscular Tomorrow-1000  . lidocaine  1 patch Transdermal Daily  . lip balm  1 application Topical BID  . multivitamin with minerals  1 tablet Oral Daily  . nicotine  21 mg Transdermal Daily  . pantoprazole  40 mg Oral Daily  . polycarbophil  625 mg Oral Daily  . sodium chloride flush  5 mL Intracatheter Q8H   Continuous Infusions: . sodium chloride 10 mL/hr at 12/14/20 0240  . ondansetron (ZOFRAN) IV 8 mg (12/01/20 0152)     LOS: 29 days   Time spent: 35 minutes   Drey Shaff Estill Cotta, MD [facility]  If 7PM-7AM, please contact night-coverage [email or web address] 12/14/2020, 10:45 AM

## 2020-12-15 MED ORDER — OXYCODONE HCL 10 MG PO TABS: 5.0000 mg | ORAL_TABLET | Freq: Four times a day (QID) | ORAL | 0 refills | Status: DC | PRN

## 2020-12-15 NOTE — Discharge Summary (Signed)
Physician Discharge Summary  Eric [name] ZOX:[id number] DOB: 03-29-1971 DOA: 11/15/2020  PCP: [facility]  Admit date: 11/15/2020 Discharge date: 12/15/2020  Admitted From: home Disposition:  home  Recommendations for Outpatient Follow-up:  1. Follow up with PCP in 1-2 weeks 2. Please obtain BMP/CBC in one week 3. Follow-up with general surgery as a scheduled 4. Follow-up with oncology as a scheduled  Home Health:none  Equipment/Devices:none  Discharge Condition:stable  CODE STATUS:full code Diet recommendation:   Brief/Interim Summary: 50 year old male with no known PMH presented with right groin pain found to have retroperitoneal abscess with bowel perforation cecal mass,incidental Covid positive seen by surgery placed on TPN for severe protein calorie malnutrition and had IR drain the abscess.Patient underwent right hemicolectomy with ileostomy 2/17.  His postop course complicated by ileus-NG tube was placed on 2/20.  Attempted NG clamping on 2/22 however he vomited.  He slowly and gradually started tolerating soft diet and NG tube was removed however on 2/26 patient feeling nauseous and started vomiting again.  X-ray abdomen showed severely dilated small bowel loops concerning for distal small bowel obstruction-NG tube was placed back in.  He started on clear liquid and then advance to soft diet which he tolerated and his TPN was discontinued.  Plan was to discharge patient on 3/4 however he vomited again and started having severe abdominal pain.  X-ray abdomen shows SBO versus early ileus.  He kept n.p.o. and his symptoms improved.  His diet is advanced to soft diet which he tolerated without any issues.  Perforated Stage IV colon cancer with RTP abscess s/p right colectomy/ileostomy 11/28/20, s/p cystoscopy and ureteral stent, with liver mets -Complicated with postop ileus: -POD #17 -Underwent Port-A-Cath placement today 3/3 -3/6: Patient tolerated soft diet without  any issues.  He remained asymptomatic.  Okay to discharge from surgery standpoint. -He has follow-up appointment with Dr. Sherrill-oncology is planning to initiate FOLFOX in the future.  He has a scheduled appointment at [facility] on 12/18/2020. Has a scheduled appointment with general surgery.  Sepsis POA 2/2 perforated colon mass: -Sepsis resolved. -Status post drain placement on 2/7-and removed on 3/1 -culture grew Enterobactererrogeneous.  Remained afebrile with no leukocytosis. -Completed abx Zosyn- from 2/4 to 2/22.  Blood loss anemia/microcytic anemia -with hemoglobin 5.4 g on admission 2/2 chronic GI bleed from colon cancer. -S/P PRBC x4 units  Moderate protein calorie malnutrition: Was on TPN.  He started tolerating diet-TPN discontinued.  Thrombocytosislikely reactive.Downtrending.  Hyponatremia: -Mild.  Sodium 132.  Hyperglycemic-likely reactive. A1c is normal.  COVID-19 virus infection positive incidental/asymptomatic s/px3 days remdesivir,off isolation 2/15.  Tobacco abuse: Counseled to quit -Nicotine patch  Deconditioning: Resolved.  He is ambulating well without any issues.   Discharge Diagnoses:  Perforated stage IV colon cancer with liver mets with RTP abscess status post right colectomy/ileostomy Status post cystoscopy and ureteral stent placement Status post Port-A-Cath insertion Postop ileus Sepsis secondary to perforated colon mass Acute blood loss anemia/microcytic anemia Deconditioning Moderate protein calorie malnutrition Thrombocytosis Hyponatremia Hyperglycemia COVID-19 infection Tobacco abuse   Discharge Instructions  Discharge Instructions    Diet - low sodium heart healthy   Complete by: As directed    Discharge instructions   Complete by: As directed    Follow-up with PCP in 1 to 2 weeks Follow-up with general surgery as a scheduled Follow-up with oncology as a scheduled   Discharge instructions   Complete by: As  directed    1. Follow up with PCP in 1-2 weeks  2. Please obtain BMP/CBC in one week 3. Follow-up with general surgery as a scheduled 4. Follow-up with oncology as a scheduled   Increase activity slowly   Complete by: As directed    Increase activity slowly   Complete by: As directed    No dressing needed   Complete by: As directed    No wound care   Complete by: As directed      Allergies as of 12/15/2020   No Known Allergies     Medication List    TAKE these medications   acetaminophen 500 MG tablet Commonly known as: TYLENOL Take 2 tablets (1,000 mg total) by mouth every 6 (six) hours. What changed:   how much to take  when to take this  reasons to take this   ALKA-SELTZER ANTACID PO Take 1 tablet by mouth daily as needed (For pain).   famotidine 20 MG tablet Commonly known as: PEPCID Take 20 mg by mouth 2 (two) times daily.   feeding supplement Liqd Take 237 mLs by mouth 3 (three) times daily between meals.   methocarbamol 500 MG tablet Commonly known as: ROBAXIN Take 2 tablets (1,000 mg total) by mouth every 8 (eight) hours as needed for muscle spasms.   multivitamin with minerals Tabs tablet Take 1 tablet by mouth daily.   ondansetron 4 MG tablet Commonly known as: ZOFRAN Take 1 tablet (4 mg total) by mouth every 6 (six) hours as needed for nausea.   Oxycodone HCl 10 MG Tabs Take 0.5-1 tablets (5-10 mg total) by mouth every 6 (six) hours as needed for moderate pain or severe pain (5mg  moderate, 10mg  severe).   pantoprazole 40 MG tablet Commonly known as: PROTONIX Take 1 tablet (40 mg total) by mouth daily.   polycarbophil 625 MG tablet Commonly known as: FIBERCON Take 1 tablet (625 mg total) by mouth daily.            Durable Medical Equipment  (From admission, onward)         Start     Ordered   12/06/20 1336  For home use only DME Other see comment  Once       Comments: dressing supplies what does he need? Normal saline 4 x 4's ABD  pads and tape  Question:  Length of Need  Answer:  6 Months   12/06/20 1336           Discharge Care Instructions  (From admission, onward)         Start     Ordered   12/13/20 0000  No dressing needed        12/13/20 1000          Follow-up Information    [facility].   Contact information: [place] [id number] [phone]       Ladell Pier, MD. Go on 12/18/2020.   Specialty: Oncology Why: Please call to confirm your appointment time for follow up Contact information: [place] [address] [phone]        Ralene Ok, MD. Go on 01/03/2021.   Specialty: General Surgery Why: 830am. Please arrive 30 minutes prior to your appointment for paperwork. Please bring a copy of your photo ID and insurance card  Contact information: [place] [address] [phone]              No Known Allergies  Consultations:  General  surgery  Oncology   Procedures/Studies: DG Abd 1 View  Result Date: 12/01/2020 CLINICAL DATA:  Evaluate NG tube EXAM: ABDOMEN - 1 VIEW COMPARISON:  December 01, 2020 FINDINGS: The NG tube is been reposition in the interval now terminates in the stomach. IMPRESSION: The NG tube has been reposition in the interval and now terminates in the stomach. Electronically Signed   By: Dorise Bullion III M.D   On: 12/01/2020 12:43   DG Abd 1 View  Result Date: 12/01/2020 CLINICAL DATA:  Evaluate NG tube placement EXAM: ABDOMEN - 1 VIEW COMPARISON:  12/01/2020 FINDINGS: The air is a right arm PICC line with tip noted in the projection of the cavoatrial junction. The nasogastric tube is looped within the distal half of the esophagus with tip and side port above the GE junction and directed cranially. Dilated loop of bowel is noted in the left abdomen. There is a large stool burden overlying the right lower quadrant of  the abdomen. IMPRESSION: 1. The nasogastric tube is looped within the distal half of the esophagus with tip and side port above the GE junction. 2. Dilated loop of bowel in the left abdomen. 3. Large stool burden overlying the right lower quadrant of the abdomen. Electronically Signed   By: Kerby Moors M.D.   On: 12/01/2020 12:37   DG Abd 1 View  Result Date: 12/01/2020 CLINICAL DATA:  NG tube placement. EXAM: ABDOMEN - 1 VIEW COMPARISON:  CT AP 11/25/2020 FINDINGS: Right arm PICC line tip is at the cavoatrial junction. The nasogastric tube is looped within the distal half of the esophagus. The distal end of the NG tube is oriented cranially with tip at the level of the carina. IMPRESSION: The nasogastric tube is looped within the distal half of the esophagus. The distal end of the NG tube is oriented cranially with tip at the level of the carina. Advise repositioning. Electronically Signed   By: Kerby Moors M.D.   On: 12/01/2020 12:29   CT Angio Chest PE W and/or Wo Contrast  Result Date: 11/15/2020 CLINICAL DATA:  Positive D-dimer, COVID-19 positive, tobacco abuse, right lower quadrant abdominal pain EXAM: CT ANGIOGRAPHY CHEST CT ABDOMEN AND PELVIS WITH CONTRAST TECHNIQUE: Multidetector CT imaging of the chest was performed using the standard protocol during bolus administration of intravenous contrast. Multiplanar CT image reconstructions and MIPs were obtained to evaluate the vascular anatomy. Multidetector CT imaging of the abdomen and pelvis was performed using the standard protocol during bolus administration of intravenous contrast. CONTRAST:  54mL OMNIPAQUE IOHEXOL 350 MG/ML SOLN COMPARISON:  11/15/2020 FINDINGS: CTA CHEST FINDINGS Cardiovascular: This is a technically adequate evaluation of the pulmonary vasculature. No filling defects or pulmonary emboli. The heart is unremarkable without pericardial effusion. Mild atherosclerosis within the LAD distribution of the coronary vasculature.  Normal caliber of the thoracic aorta without evidence of dissection. Mediastinum/Nodes: Mild wall thickening of the distal thoracic esophagus is nonspecific and could reflect esophagitis. Thyroid and trachea are unremarkable. No pathologic adenopathy. Lungs/Pleura: No acute airspace disease, effusion, or pneumothorax. Mild upper lobe predominant emphysema with areas of subpleural scarring. Central airways are patent. Musculoskeletal: No acute or destructive bony lesions. Reconstructed images demonstrate no additional findings. Review of the MIP images confirms the above findings. CT ABDOMEN and PELVIS FINDINGS Hepatobiliary: There is an indeterminate hypodensity within the inferior aspect right lobe liver, measuring approximately 3.5 x 2.5 cm reference image 36/4. No other focal liver abnormalities. The gallbladder is unremarkable. No intrahepatic duct dilation. Pancreas: Unremarkable.  No pancreatic ductal dilatation or surrounding inflammatory changes. Spleen: Normal in size without focal abnormality. Adrenals/Urinary Tract: Adrenal glands are unremarkable. Kidneys are normal, without renal calculi, focal lesion, or hydronephrosis. Bladder is unremarkable. Stomach/Bowel: There is masslike wall thickening involving the cecum, extending approximately 9 cm in length. There is evidence of cecal perforation posteriorly, with a complex multiloculated retroperitoneal fluid collection. There are intramuscular components of the fluid collection within the right psoas and iliacus muscles, with a fluid collection extending into the proximal aspect of the iliopsoas tendon. This collection measures approximately 9.1 x 6.7 cm in transverse dimension, and extends approximately 18 cm in craniocaudal length. Differential diagnosis includes neoplasm or severe colitis. A normal gas-filled appendix is seen in the right lower quadrant. Vascular/Lymphatic: No discrete adenopathy within the abdomen or pelvis. Moderate atherosclerosis of  the aorta. Reproductive: Prostate is unremarkable. Other: There is no free intraperitoneal fluid or free gas. No abdominal wall hernia. Musculoskeletal: No acute or destructive bony lesions. Reconstructed images demonstrate no additional findings. Review of the MIP images confirms the above findings. IMPRESSION: 1. Long segment masslike mural thickening of the cecum, concerning for neoplasm. 2. Posterior perforation at the site of cecal wall thickening, with multilocular right retroperitoneal abscess which extends into the right psoas and right iliacus muscles as above. 3. Indeterminate hypodensity inferior right lobe liver. Dedicated nonemergent outpatient liver MRI could be performed for definitive characterization. 4. No evidence of pulmonary embolus. 5. Mild wall thickening of the distal thoracic esophagus, which could reflect mild esophagitis. 6. Aortic Atherosclerosis (ICD10-I70.0) and Emphysema (ICD10-J43.9). Critical Value/emergent results were called by telephone at the time of interpretation on 11/15/2020 at 4:16 pm to provider [place], who verbally acknowledged these results. Electronically Signed   By: Randa Ngo M.D.   On: 11/15/2020 16:22   MR PELVIS W WO CONTRAST  Result Date: 11/20/2020 CLINICAL DATA:  History of pelvic abscess. 3.5 cm liver lesion on CT. Scrotal pain. EXAM: MRI ABDOMEN AND PELVIS WITHOUT AND WITH CONTRAST TECHNIQUE: Multiplanar multisequence MR imaging of the abdomen and pelvis was performed both before and after the administration of intravenous contrast. CONTRAST:  75mL GADAVIST GADOBUTROL 1 MMOL/ML IV SOLN COMPARISON:  CT scan 11/15/2020. FINDINGS: COMBINED FINDINGS FOR BOTH MR ABDOMEN AND PELVIS Lower chest: Unremarkable. Hepatobiliary: As noted on recent CT scan, there is a 3.5 x 2.5 cm heterogeneous lesion in the inferior right liver peripherally. This shows irregular peripheral and heterogeneous central enhancement after IV contrast administration. T2 imaging shows a  surrounding halo of edema. No substantial restricted diffusion. A similar lesion is identified in the medial subcapsular right liver (segment VI) measuring 1.9 x 2.1 cm (see postcontrast image 48 of series 26). 1 additional irregular rim enhancing lesion is seen in the inferior left liver (segment IV B) visible on image 59 of series 26. 17 mm simple cyst identified lateral segment left liver. Several additional 3-4 mm hypoenhancing foci are seen scattered in the liver parenchyma, much too small to characterize but likely benign and may be tiny cysts or biliary hamartomas. There is no evidence for gallstones, gallbladder wall thickening, or pericholecystic fluid. Probable adenomyomatosis at the fundus. No intrahepatic or extrahepatic biliary dilation. Pancreas: No focal mass lesion. No dilatation of the main duct. No intraparenchymal cyst. No peripancreatic edema. Spleen:  No splenomegaly. No focal mass lesion. Adrenals/Urinary Tract: No adrenal nodule or mass. Kidneys unremarkable. No hydroureter. Bladder unremarkable. Stomach/Bowel: Stomach is unremarkable. No gastric wall thickening. No evidence of outlet obstruction. Duodenum is normally  positioned as is the ligament of Treitz. Mild diffuse distention of small bowel evident. Marked wall thickening again noted in the cecum. Colon is diffusely distended and stool-filled as before. Vascular/Lymphatic: No abdominal aortic aneurysm There is no gastrohepatic or hepatoduodenal ligament lymphadenopathy. No retroperitoneal or mesenteric lymphadenopathy. No pelvic sidewall lymphadenopathy. Reproductive: The prostate gland and seminal vesicles are unremarkable. Although not a dedicated scrotal exam, there is no evidence for testicular mass lesion or hydrocele. Other:  No substantial intraperitoneal free fluid. Musculoskeletal: Right iliopsoas abscess again noted with percutaneous drainage catheter now in place with distal loop formed in the psoas component of the process.  Interval decrease in size of the rim enhancing fluid collection seen extending down anterior to the right hip as before. No suspicious focal marrow abnormality. IMPRESSION: 1. 3.5 x 2.5 cm heterogeneous enhancing lesion in the inferior peripheral right liver with 2 additional smaller lesions having similar imaging features. Given the large right retroperitoneal abscess, intrahepatic abscesses would be a distinct consideration. Metastatic disease also a consideration. Tissue sampling likely warranted. 2. Interval decrease in size of the rim enhancing right iliopsoas fluid collection status post percutaneous drain placement. 3. Marked wall thickening again noted in the cecum with diffuse distention of small bowel loops. These findings are better characterized on the recent CT scan. Electronically Signed   By: Misty Stanley M.D.   On: 11/20/2020 05:53   MR ABDOMEN W WO CONTRAST  Result Date: 11/20/2020 CLINICAL DATA:  History of pelvic abscess. 3.5 cm liver lesion on CT. Scrotal pain. EXAM: MRI ABDOMEN AND PELVIS WITHOUT AND WITH CONTRAST TECHNIQUE: Multiplanar multisequence MR imaging of the abdomen and pelvis was performed both before and after the administration of intravenous contrast. CONTRAST:  46mL GADAVIST GADOBUTROL 1 MMOL/ML IV SOLN COMPARISON:  CT scan 11/15/2020. FINDINGS: COMBINED FINDINGS FOR BOTH MR ABDOMEN AND PELVIS Lower chest: Unremarkable. Hepatobiliary: As noted on recent CT scan, there is a 3.5 x 2.5 cm heterogeneous lesion in the inferior right liver peripherally. This shows irregular peripheral and heterogeneous central enhancement after IV contrast administration. T2 imaging shows a surrounding halo of edema. No substantial restricted diffusion. A similar lesion is identified in the medial subcapsular right liver (segment VI) measuring 1.9 x 2.1 cm (see postcontrast image 48 of series 26). 1 additional irregular rim enhancing lesion is seen in the inferior left liver (segment IV B) visible on  image 59 of series 26. 17 mm simple cyst identified lateral segment left liver. Several additional 3-4 mm hypoenhancing foci are seen scattered in the liver parenchyma, much too small to characterize but likely benign and may be tiny cysts or biliary hamartomas. There is no evidence for gallstones, gallbladder wall thickening, or pericholecystic fluid. Probable adenomyomatosis at the fundus. No intrahepatic or extrahepatic biliary dilation. Pancreas: No focal mass lesion. No dilatation of the main duct. No intraparenchymal cyst. No peripancreatic edema. Spleen:  No splenomegaly. No focal mass lesion. Adrenals/Urinary Tract: No adrenal nodule or mass. Kidneys unremarkable. No hydroureter. Bladder unremarkable. Stomach/Bowel: Stomach is unremarkable. No gastric wall thickening. No evidence of outlet obstruction. Duodenum is normally positioned as is the ligament of Treitz. Mild diffuse distention of small bowel evident. Marked wall thickening again noted in the cecum. Colon is diffusely distended and stool-filled as before. Vascular/Lymphatic: No abdominal aortic aneurysm There is no gastrohepatic or hepatoduodenal ligament lymphadenopathy. No retroperitoneal or mesenteric lymphadenopathy. No pelvic sidewall lymphadenopathy. Reproductive: The prostate gland and seminal vesicles are unremarkable. Although not a dedicated scrotal exam, there is no  evidence for testicular mass lesion or hydrocele. Other:  No substantial intraperitoneal free fluid. Musculoskeletal: Right iliopsoas abscess again noted with percutaneous drainage catheter now in place with distal loop formed in the psoas component of the process. Interval decrease in size of the rim enhancing fluid collection seen extending down anterior to the right hip as before. No suspicious focal marrow abnormality. IMPRESSION: 1. 3.5 x 2.5 cm heterogeneous enhancing lesion in the inferior peripheral right liver with 2 additional smaller lesions having similar imaging  features. Given the large right retroperitoneal abscess, intrahepatic abscesses would be a distinct consideration. Metastatic disease also a consideration. Tissue sampling likely warranted. 2. Interval decrease in size of the rim enhancing right iliopsoas fluid collection status post percutaneous drain placement. 3. Marked wall thickening again noted in the cecum with diffuse distention of small bowel loops. These findings are better characterized on the recent CT scan. Electronically Signed   By: Misty Stanley M.D.   On: 11/20/2020 05:53   CT ABDOMEN PELVIS W CONTRAST  Result Date: 12/09/2020 CLINICAL DATA:  Postop abdominal pain and distension. Fever. Perforated colon carcinoma. EXAM: CT ABDOMEN AND PELVIS WITH CONTRAST TECHNIQUE: Multidetector CT imaging of the abdomen and pelvis was performed using the standard protocol following bolus administration of intravenous contrast. CONTRAST:  169mL OMNIPAQUE IOHEXOL 300 MG/ML  SOLN COMPARISON:  11/25/2020 FINDINGS: Lower Chest: No acute findings. Hepatobiliary: Several hypovascular hepatic masses are seen, some which are new or increased since previous study. Dominant lesion in anterior segment the right lobe measures 4.3 x 2.3 cm on image 32/3, compared to 3.4 x 2.2 cm previously. A hypovascular mass in the right lobe along posterior margin the gallbladder fossa currently measures 2.7 x 2.1 cm on image 29/3, compared to 2.3 x 1.8 cm previously. Two small hypovascular masses adjacent to the gallbladder fossa are also slightly increased in size since previous study. These are consistent with progressive liver metastases. Small cyst in the lateral segment of the left lobe is stable. Gallbladder is unremarkable. No evidence of biliary ductal dilatation. Pancreas:  No mass or inflammatory changes. Spleen: Within normal limits in size and appearance. Adrenals/Urinary Tract: No masses identified. No evidence of ureteral calculi or hydronephrosis. Stomach/Bowel: Patient  has undergone right colectomy and right lower quadrant ileostomy since prior study. Surgical drain is seen in the right abdomen. Nasogastric tube is seen within the stomach. No evidence of bowel obstruction or abscess. Vascular/Lymphatic: Right iliopsoas surgical drain is seen place. Persistent lobulated soft tissue density in the right retroperitoneum along the psoas and iliacus muscles, at site of previous percutaneous drainage catheter. This shows no significant change since prior study. No new or enlarging soft tissue masses are identified. No lymphadenopathy identified. Reproductive:  No mass or other significant abnormality. Other:  None. Musculoskeletal:  No suspicious bone lesions identified. IMPRESSION: Interval right colectomy and right lower quadrant ileostomy. No evidence of abscess or bowel obstruction. Stable right retroperitoneal soft tissue density along the psoas and iliacus muscles, at site of previous percutaneous drainage catheter. Metastatic disease cannot be excluded. Mild progression of liver metastases since prior study. Electronically Signed   By: Marlaine Hind M.D.   On: 12/09/2020 13:49   CT ABDOMEN PELVIS W CONTRAST  Result Date: 11/26/2020 CLINICAL DATA:  Abdominal pain, abscess suspected, history of colon cancer EXAM: CT ABDOMEN AND PELVIS WITH CONTRAST TECHNIQUE: Multidetector CT imaging of the abdomen and pelvis was performed using the standard protocol following bolus administration of intravenous contrast. CONTRAST:  162mL OMNIPAQUE IOHEXOL  300 MG/ML  SOLN COMPARISON:  11/15/2020 FINDINGS: Lower chest: No acute abnormality. Hepatobiliary: Redemonstrated ill-defined hypodense lesion of the subcapsular inferior right lobe of the liver, hepatic segment VI, measuring 3.6 x 2.4 cm (series 10, image 29). No gallstones, gallbladder wall thickening, or biliary dilatation. Pancreas: Unremarkable. No pancreatic ductal dilatation or surrounding inflammatory changes. Spleen: Normal in size  without significant abnormality. Adrenals/Urinary Tract: Adrenal glands are unremarkable. Kidneys are normal, without renal calculi, solid lesion, or hydronephrosis. Bladder is unremarkable. Stomach/Bowel: Stomach is within normal limits. The terminal small bowel is distended, measuring up to 5.0 cm in caliber. 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 Vascular/Lymphatic: Aortic atherosclerosis. Enlarged lymph nodes in the right lower quadrant mesocolon measuring up to 1.5 x 1.1 cm (series 10, image 46). Reproductive: No mass or other significant abnormality. Other: No abdominal wall hernia or abnormality. There has been interval placement of a right lower quadrant percutaneous pigtail drainage catheter within a retroperitoneal fluid collection, now almost completely resolved with no discretely visualized fluid remaining (series 10, image 42, 51). Trace ascites in the pelvis. Musculoskeletal: No acute or significant osseous findings. IMPRESSION: 1. There has been interval placement of a right lower quadrant percutaneous pigtail drainage catheter within a retroperitoneal fluid collection, now almost completely resolved with no discretely visualized fluid remaining. 2. There is a redemonstrated large, fungating mass of the cecal base centered around the ileocecal valve, measuring at least 11.9 x 9.1 x 8.4 cm. Enlarged lymph nodes in the right lower quadrant mesocolon measuring up to 1.5 x 1.1 cm. 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 3. The terminal small bowel is distended, measuring up to 5.0 cm in caliber. Findings are consistent with at partial small bowel obstruction due to obstipating mass. 4. Redemonstrated ill-defined hypodense lesion of the subcapsular inferior right lobe of the liver, hepatic segment VI,  which has been biopsied to evaluate for metastatic disease. 5. Trace ascites in the pelvis. Aortic Atherosclerosis (ICD10-I70.0). Electronically Signed   By: Eddie Candle M.D.   On: 11/26/2020 08:33   CT ABDOMEN PELVIS W CONTRAST  Result Date: 11/15/2020 CLINICAL DATA:  Positive D-dimer, COVID-19 positive, tobacco abuse, right lower quadrant abdominal pain EXAM: CT ANGIOGRAPHY CHEST CT ABDOMEN AND PELVIS WITH CONTRAST TECHNIQUE: Multidetector CT imaging of the chest was performed using the standard protocol during bolus administration of intravenous contrast. Multiplanar CT image reconstructions and MIPs were obtained to evaluate the vascular anatomy. Multidetector CT imaging of the abdomen and pelvis was performed using the standard protocol during bolus administration of intravenous contrast. CONTRAST:  87mL OMNIPAQUE IOHEXOL 350 MG/ML SOLN COMPARISON:  11/15/2020 FINDINGS: CTA CHEST FINDINGS Cardiovascular: This is a technically adequate evaluation of the pulmonary vasculature. No filling defects or pulmonary emboli. The heart is unremarkable without pericardial effusion. Mild atherosclerosis within the LAD distribution of the coronary vasculature. Normal caliber of the thoracic aorta without evidence of dissection. Mediastinum/Nodes: Mild wall thickening of the distal thoracic esophagus is nonspecific and could reflect esophagitis. Thyroid and trachea are unremarkable. No pathologic adenopathy. Lungs/Pleura: No acute airspace disease, effusion, or pneumothorax. Mild upper lobe predominant emphysema with areas of subpleural scarring. Central airways are patent. Musculoskeletal: No acute or destructive bony lesions. Reconstructed images demonstrate no additional findings. Review of the MIP images confirms the above findings. CT ABDOMEN and PELVIS FINDINGS  Hepatobiliary: There is an indeterminate hypodensity within the inferior aspect right lobe liver, measuring approximately 3.5 x 2.5 cm reference image 36/4.  No other focal liver abnormalities. The gallbladder is unremarkable. No intrahepatic duct dilation. Pancreas: Unremarkable. No pancreatic ductal dilatation or surrounding inflammatory changes. Spleen: Normal in size without focal abnormality. Adrenals/Urinary Tract: Adrenal glands are unremarkable. Kidneys are normal, without renal calculi, focal lesion, or hydronephrosis. Bladder is unremarkable. Stomach/Bowel: There is masslike wall thickening involving the cecum, extending approximately 9 cm in length. There is evidence of cecal perforation posteriorly, with a complex multiloculated retroperitoneal fluid collection. There are intramuscular components of the fluid collection within the right psoas and iliacus muscles, with a fluid collection extending into the proximal aspect of the iliopsoas tendon. This collection measures approximately 9.1 x 6.7 cm in transverse dimension, and extends approximately 18 cm in craniocaudal length. Differential diagnosis includes neoplasm or severe colitis. A normal gas-filled appendix is seen in the right lower quadrant. Vascular/Lymphatic: No discrete adenopathy within the abdomen or pelvis. Moderate atherosclerosis of the aorta. Reproductive: Prostate is unremarkable. Other: There is no free intraperitoneal fluid or free gas. No abdominal wall hernia. Musculoskeletal: No acute or destructive bony lesions. Reconstructed images demonstrate no additional findings. Review of the MIP images confirms the above findings. IMPRESSION: 1. Long segment masslike mural thickening of the cecum, concerning for neoplasm. 2. Posterior perforation at the site of cecal wall thickening, with multilocular right retroperitoneal abscess which extends into the right psoas and right iliacus muscles as above. 3. Indeterminate hypodensity inferior right lobe liver. Dedicated nonemergent outpatient liver MRI could be performed for definitive characterization. 4. No evidence of pulmonary embolus. 5. Mild  wall thickening of the distal thoracic esophagus, which could reflect mild esophagitis. 6. Aortic Atherosclerosis (ICD10-I70.0) and Emphysema (ICD10-J43.9). Critical Value/emergent results were called by telephone at the time of interpretation on 11/15/2020 at 4:16 pm to provider [place], who verbally acknowledged these results. Electronically Signed   By: Randa Ngo M.D.   On: 11/15/2020 16:22   US BIOPSY (LIVER)  Result Date: 11/20/2020 INDICATION: 50 year old gentleman with right liver mass/abscess presents to interventional radiology for ultrasound-guided biopsy EXAM: Ultrasound-guided biopsy of right liver lesion MEDICATIONS: None. ANESTHESIA/SEDATION: Moderate (conscious) sedation was employed during this procedure. A total of Versed 1 mg and Fentanyl 25 mcg was administered intravenously. Moderate Sedation Time: 15 minutes. The patient's level of consciousness and vital signs were monitored continuously by radiology nursing throughout the procedure under my direct supervision. COMPLICATIONS: None immediate. PROCEDURE: Informed written consent was obtained from the patient after a thorough discussion of the procedural risks, benefits and alternatives. All questions were addressed. Maximal Sterile Barrier Technique was utilized including caps, mask, sterile gowns, sterile gloves, sterile drape, hand hygiene and skin antiseptic. A timeout was performed prior to the initiation of the procedure. Patient positioned supine on the ultrasound table. Right upper quadrant skin prepped and draped in the usual fashion. Following local lidocaine administration, 17 gauge introducer needle was advanced into the right hepatic lobe, and five 18 gauge core were obtained utilizing continuous ultrasound guidance. One core sent in sterile saline for Gram stain and culture in the remaining sent in formalin for histologic analysis. Needle removed and hemostasis achieved with 5 minutes of manual compression. IMPRESSION:  Ultrasound-guided biopsy of right liver lesion with sample sent for both pathology and Gram stain and culture. Electronically Signed   By: Miachel Roux M.D.   On: 11/20/2020 16:28   DG CHEST PORT 1 VIEW  Result Date: 12/12/2020 CLINICAL DATA:  Port-A-Cath placement EXAM: PORTABLE CHEST 1 VIEW COMPARISON:  11/15/2020 FINDINGS: Right subclavian Port-A-Cath has been placed with the tip in the SVC. Right PICC line in place with the tip in the SVC. No pneumothorax. Heart is normal size. Lungs clear. No effusions or acute bony abnormality. IMPRESSION: Right Port-A-Cath and right PICC line tips are in the SVC. No pneumothorax. No acute cardiopulmonary disease. Electronically Signed   By: Rolm Baptise M.D.   On: 12/12/2020 11:05   DG Chest Portable 1 View  Result Date: 11/15/2020 CLINICAL DATA:  COVID EXAM: PORTABLE CHEST 1 VIEW COMPARISON:  None. FINDINGS: Evaluation is limited secondary to patient rotation. The cardiomediastinal silhouette is normal in contour. Blunting of the RIGHT costophrenic angle. No pneumothorax. No acute pleuroparenchymal abnormality. Visualized abdomen is unremarkable. No acute osseous abnormality noted. IMPRESSION: Blunting of the RIGHT costophrenic angle may reflect a small pleural effusion. Electronically Signed   By: Valentino Saxon MD   On: 11/15/2020 13:27   DG Abd 2 Views  Result Date: 12/07/2020 CLINICAL DATA:  Ileus. EXAM: ABDOMEN - 2 VIEW COMPARISON:  December 01, 2020. FINDINGS: Severely dilated small bowel loops are noted with air-fluid levels concerning for distal small bowel obstruction. No colonic dilatation is noted. Surgical drain is noted in right lower quadrant. Midline surgical staples are noted. IMPRESSION: Severely dilated small bowel loops are noted with air-fluid levels concerning for distal small bowel obstruction. Electronically Signed   By: Marijo Conception M.D.   On: 12/07/2020 19:19   DG ABD ACUTE 2+V W 1V CHEST  Result Date: 12/13/2020 CLINICAL DATA:   Vomiting and distension.  History of colectomy. EXAM: DG ABDOMEN ACUTE WITH 1 VIEW CHEST COMPARISON:  Chest radiograph 12/07/2020 and CT from 12/09/2020 FINDINGS: Right subclavian Port-A-Cath with the tip in the SVC. There is also a right arm PICC line with the tip in the SVC region. Negative for pneumothorax. The lungs are clear. Heart size is normal. Dilated loops of small bowel particularly in the left upper abdomen. Loop of small bowel in the left upper abdomen measures up to 6.4 cm. Small bowel distension has progressed since the recent CT. Patient has right-sided ileostomy. IMPRESSION: 1. Increased small bowel distension particularly in the left abdomen. Differential diagnosis includes small bowel obstruction versus worsening ileus. 2. No acute chest abnormality. These results will be called to the ordering clinician or representative by the Radiologist Assistant, and communication documented in the PACS or [employer]. Electronically Signed   By: Markus Daft M.D.   On: 12/13/2020 15:44   DG Abd Portable 1V  Result Date: 12/07/2020 CLINICAL DATA:  NG tube placement EXAM: PORTABLE ABDOMEN - 1 VIEW COMPARISON:  None. FINDINGS: The bowel gas pattern is normal. Tip the NG tube is seen within the gastric fundus. A right-sided PICC is seen with the tip in the lower SVC. No radio-opaque calculi or other significant radiographic abnormality are seen. IMPRESSION: Tip the NG tube within the gastric fundus. Electronically Signed   By: Prudencio Pair M.D.   On: 12/07/2020 20:54   DG Fluoro Guide CV Line-No Report  Result Date: 12/12/2020 Fluoroscopy was utilized by the requesting physician.  No radiographic interpretation.   CT IMAGE GUIDED DRAINAGE BY PERCUTANEOUS CATHETER  Result Date: 11/19/2020 INDICATION: 50 year old male with history of cecal perforation with fluid collection in the right retroperitoneum. Suspected underlying neoplastic etiology. EXAM: CT IMAGE GUIDED DRAINAGE BY PERCUTANEOUS CATHETER  COMPARISON:  CT abdomen pelvis from 11/15/2020 MEDICATIONS:  The patient is currently admitted to the hospital and receiving intravenous antibiotics. The antibiotics were administered within an appropriate time frame prior to the initiation of the procedure. ANESTHESIA/SEDATION: Moderate (conscious) sedation was employed during this procedure. A total of Versed 1 mg and Fentanyl 25 mcg was administered intravenously. Moderate Sedation Time: 21 minutes. The patient's level of consciousness and vital signs were monitored continuously by radiology nursing throughout the procedure under my direct supervision. CONTRAST:  None COMPLICATIONS: None immediate. PROCEDURE: Informed written consent was obtained from the patient after a discussion of the risks, benefits and alternatives to treatment. The patient was placed supine on the CT gantry and a pre procedural CT was performed re-demonstrating the known abscess/fluid collection within the right retroperitoneum. The procedure was planned. A timeout was performed prior to the initiation of the procedure. The right lower quadrant was prepped and draped in the usual sterile fashion. The overlying soft tissues were anesthetized with 1% lidocaine with epinephrine. Appropriate trajectory was planned with the use of a 22 gauge spinal needle. An 18 gauge trocar needle was advanced into the abscess/fluid collection and a short Amplatz super stiff wire was coiled within the collection. Appropriate positioning was confirmed with a limited CT scan. The tract was serially dilated allowing placement of a 14 French all-purpose drainage catheter. Appropriate positioning was confirmed with a limited postprocedural CT scan. Approximately 400 mL ml of purulent fluid was aspirated. The tube was connected to a bulb suction and sutured in place. A dressing was placed. The patient tolerated the procedure well without immediate post procedural complication. IMPRESSION: Successful CT guided  placement of a 83 French all purpose drain catheter into the right retroperitoneum with aspiration of approximately 400 mL of purulent fluid. Samples were sent to the laboratory for culture as well as cytology given suspicion for underlying neoplastic etiology. Ruthann Cancer, MD [facility] Electronically Signed   By: Ruthann Cancer MD   On: 11/19/2020 08:01   [place] EKG SITE RITE  Result Date: 11/18/2020 If Site Rite image not attached, placement could not be confirmed due to current cardiac rhythm.      Subjective: Patient seen and examined.  No further episodes of vomiting or abdominal pain.  Tolerated soft diet without any issues.  Comfortable going home today.  Discharge Exam: Vitals:   12/15/20 0357 12/15/20 0400  BP: 114/83   Pulse: 69   Resp: 18   Temp:  97.9 F (36.6 C)  SpO2: 100%    Vitals:   12/14/20 1440 12/14/20 1952 12/15/20 0357 12/15/20 0400  BP: 116/83 118/78 114/83   Pulse: 75 72 69   Resp: 20 18 18    Temp: 97.6 F (36.4 C) 97.9 F (36.6 C)  97.9 F (36.6 C)  TempSrc:  Axillary    SpO2: 99% 100% 100%   Weight:      Height:        General: Pt is alert, awake, not in acute distress, on room air, communicating well, appears chronically ill. Cardiovascular: RRR, S1/S2 +, no rubs, no gallops Respiratory: CTA bilaterally, no wheezing, no rhonchi Abdominal: Soft, NT, ND, bowel sounds + yellow stool noted in ileostomy bag.  Incision: No drainage or bleeding noted. Extremities: no edema, no cyanosis    The results of significant diagnostics from this hospitalization (including imaging, microbiology, ancillary and laboratory) are listed below for reference.     Microbiology: No results found for this or any previous visit (from the past 240  hour(s)).   Labs: BNP (last 3 results) No results for input(s): BNP in the last 8760 hours. Basic Metabolic Panel: Recent Labs  Lab 12/09/20 0311 12/12/20 0325  12/13/20 0336  NA 134* 133* 132*  K 4.0 4.4 4.1  CL 99 101 98  CO2 25 24 25   GLUCOSE 135* 101* 131*  BUN 31* 11 13  CREATININE 0.63 0.62 0.63  CALCIUM 9.0 9.1 9.1  MG 2.2 2.0 2.0  PHOS 4.5 5.3*  --    Liver Function Tests: Recent Labs  Lab 12/09/20 0311 12/12/20 0325  AST 20 18  ALT 35 31  ALKPHOS 279* 231*  BILITOT 0.5 0.7  PROT 6.4* 6.0*  ALBUMIN 2.6* 2.5*   No results for input(s): LIPASE, AMYLASE in the last 168 hours. No results for input(s): AMMONIA in the last 168 hours. CBC: Recent Labs  Lab 12/09/20 0311 12/12/20 0325 12/13/20 0336  WBC 9.5 6.2 9.0  NEUTROABS 6.2  --   --   HGB 9.3* 8.4* 8.5*  HCT 31.2* 28.3* 27.0*  MCV 82.3 83.5 80.8  PLT 667* 528* 554*   Cardiac Enzymes: No results for input(s): CKTOTAL, CKMB, CKMBINDEX, TROPONINI in the last 168 hours. BNP: Invalid input(s): POCBNP CBG: Recent Labs  Lab 12/10/20 1134 12/10/20 1748 12/10/20 1958 12/11/20 1009 12/11/20 2223  GLUCAP 113* 116* 102* 122* 111*   D-Dimer No results for input(s): DDIMER in the last 72 hours. Hgb A1c No results for input(s): HGBA1C in the last 72 hours. Lipid Profile No results for input(s): CHOL, HDL, LDLCALC, TRIG, CHOLHDL, LDLDIRECT in the last 72 hours. Thyroid function studies No results for input(s): TSH, T4TOTAL, T3FREE, THYROIDAB in the last 72 hours.  Invalid input(s): FREET3 Anemia work up No results for input(s): VITAMINB12, FOLATE, FERRITIN, TIBC, IRON, RETICCTPCT in the last 72 hours. Urinalysis    Component Value Date/Time   COLORURINE YELLOW 11/16/2020 0015   APPEARANCEUR CLEAR 11/16/2020 0015   LABSPEC 1.044 (H) 11/16/2020 0015   PHURINE 5.0 11/16/2020 0015   GLUCOSEU NEGATIVE 11/16/2020 0015   HGBUR NEGATIVE 11/16/2020 0015   BILIRUBINUR NEGATIVE 11/16/2020 0015   KETONESUR NEGATIVE 11/16/2020 0015   PROTEINUR NEGATIVE 11/16/2020 0015   NITRITE NEGATIVE 11/16/2020 0015   LEUKOCYTESUR NEGATIVE 11/16/2020 0015   Sepsis Labs Invalid  input(s): PROCALCITONIN,  WBC,  LACTICIDVEN Microbiology No results found for this or any previous visit (from the past 240 hour(s)).   Time coordinating discharge: Over 30 minutes  SIGNED:   Mckinley Jewel, MD  [facility] 12/15/2020, 9:42 AM Pager   If 7PM-7AM, please contact night-coverage [email or web address]

## 2020-12-15 NOTE — Progress Notes (Signed)
RUA PICC line d/c'ed per MD order.  Pt verbalizes dressing care, interventions for bleeding, signs of infection and when to call MD via teachback method. No bleeding at site, Site WNL, cleaned with CHG and covered with petrolatum guaze and dry 2x2. RN notified.

## 2020-12-15 NOTE — Progress Notes (Signed)
Patient received RX Oxycodone 5 MG tabs (Quantity 15)  from TOC. Per surgery's request, patient returned 15 tablets of Oxycodone as they called in [facility]codone HCL to [facility] for [facility] to pick up.   Returned medication delivered to main pharmacy. Spoke with Dwayne, pharmacist regarding this.

## 2020-12-15 NOTE — Progress Notes (Signed)
[facility] Progress Note  3 Days Post-Op  Subjective: CC-  No complaints today. Abdominal pain well controlled. Denies n/v. Tolerating soft diet and ileostomy functioning.  Objective: Vital signs in last 24 hours: Temp:  [97.6 F (36.4 C)-97.9 F (36.6 C)] 97.9 F (36.6 C) (03/06 0400) Pulse Rate:  [69-75] 69 (03/06 0357) Resp:  [18-20] 18 (03/06 0357) BP: (114-118)/(78-83) 114/83 (03/06 0357) SpO2:  [99 %-100 %] 100 % (03/06 0357) Last BM Date: 12/12/20  Intake/Output from previous day: 03/05 0701 - 03/06 0700 In: 720 [P.O.:720] Out: 2100 [Urine:1600; Stool:500] Intake/Output this shift: Total I/O In: -  Out: 950 [Urine:950]  PE: Gen: alert, NAD Lungs:normal rate and effort. R chest wall with port in place. Incision c/d/i OXB:DZHG,DJ, ND, +BS, ileostomy in R hemiabdomen - edematous and viable -airandsemi-liquidstoolin pouch.500cc/24 hoursdocumented.Midline wound with some scabbing but no erythema or drainage   Lab Results:  Recent Labs    12/13/20 0336  WBC 9.0  HGB 8.5*  HCT 27.0*  PLT 554*   BMET Recent Labs    12/13/20 0336  NA 132*  K 4.1  CL 98  CO2 25  GLUCOSE 131*  BUN 13  CREATININE 0.63  CALCIUM 9.1   PT/INR No results for input(s): LABPROT, INR in the last 72 hours. CMP     Component Value Date/Time   NA 132 (L) 12/13/2020 0336   K 4.1 12/13/2020 0336   CL 98 12/13/2020 0336   CO2 25 12/13/2020 0336   GLUCOSE 131 (H) 12/13/2020 0336   BUN 13 12/13/2020 0336   CREATININE 0.63 12/13/2020 0336   CALCIUM 9.1 12/13/2020 0336   PROT 6.0 (L) 12/12/2020 0325   ALBUMIN 2.5 (L) 12/12/2020 0325   AST 18 12/12/2020 0325   ALT 31 12/12/2020 0325   ALKPHOS 231 (H) 12/12/2020 0325   BILITOT 0.7 12/12/2020 0325   GFRNONAA >60 12/13/2020 0336   Lipase  No results found for: LIPASE     Studies/Results: DG ABD ACUTE 2+V W 1V CHEST  Result Date: 12/13/2020 CLINICAL DATA:  Vomiting and distension.  History of  colectomy. EXAM: DG ABDOMEN ACUTE WITH 1 VIEW CHEST COMPARISON:  Chest radiograph 12/07/2020 and CT from 12/09/2020 FINDINGS: Right subclavian Port-A-Cath with the tip in the SVC. There is also a right arm PICC line with the tip in the SVC region. Negative for pneumothorax. The lungs are clear. Heart size is normal. Dilated loops of small bowel particularly in the left upper abdomen. Loop of small bowel in the left upper abdomen measures up to 6.4 cm. Small bowel distension has progressed since the recent CT. Patient has right-sided ileostomy. IMPRESSION: 1. Increased small bowel distension particularly in the left abdomen. Differential diagnosis includes small bowel obstruction versus worsening ileus. 2. No acute chest abnormality. These results will be called to the ordering clinician or representative by the Radiologist Assistant, and communication documented in the PACS or [employer]. Electronically Signed   By: Markus Daft M.D.   On: 12/13/2020 15:44    Anti-infectives: Anti-infectives (From admission, onward)   Start     Dose/Rate Route Frequency Ordered Stop   11/30/20 1045  ceFAZolin (ANCEF) IVPB 2g/100 mL premix  Status:  Discontinued        2 g 200 mL/hr over 30 Minutes Intravenous On call to O.R. 11/30/20 0957 11/30/20 1043   11/16/20 1000  remdesivir 100 mg in sodium chloride 0.9 % 100 mL IVPB       "Followed by" Linked Group  Details   100 mg 200 mL/hr over 30 Minutes Intravenous Daily 11/15/20 2135 11/17/20 1002   11/15/20 2230  piperacillin-tazobactam (ZOSYN) IVPB 3.375 g        3.375 g 12.5 mL/hr over 240 Minutes Intravenous Every 8 hours 11/15/20 2144 12/03/20 2359   11/15/20 2134  remdesivir 200 mg in sodium chloride 0.9% 250 mL IVPB       "Followed by" Linked Group Details   200 mg 580 mL/hr over 30 Minutes Intravenous Once 11/15/20 2135 11/16/20 0029   11/15/20 1700  piperacillin-tazobactam (ZOSYN) IVPB 3.375 g        3.375 g 100 mL/hr over 30 Minutes Intravenous  Once  11/15/20 1657 11/15/20 1826       Assessment/Plan Anemia- hgb 8.5 (3/4) Hyponatremia Tobacco abuse COVID-19positive - off isolation 2/15 Severe malnutrition - prealbumin 5.8 (2/8), now 33.1. TPNweaned off.  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 Rosendo Gros; Dr. Abner Greenspan) - S/pPortacath insertion- Dr. [name] - 12/12/2020 - POD #17 and 3 respectively  - CXR post op without PTX - AFVSS.  -S/p IR drain placement 2/7 - this drain was removed intra-operatively and new blake drain placed; IR cx Enterobacter aerogenes. IV Zosyn completed 2/22 at [facility]. SurgicalDrain removed 3/1 -Continue to mobilize and pulm toilet - Was set to discharge 3/4 then developed n/v. Xray showed some distended loops of small bowel. Suspect food bolus as he does not have his teeth and admits to eating large pieces of solid food without chewing - Monitor ileostomy output.On fiber- continue.Goal output < 1L. We discussed monitoring output at home.  ID -zosyn 2/4-2/22. None currently. FEN -soft diet VTE: SCDs,SQ heparin Foley -D/C-ed 2/18. Voiding. Follow up - oncology (Dr. Benay Spice), Dr. Rosendo Gros  Plan: Tolerating diet, no further n/v, ileostomy productive. [facility] for discharge today from surgical standpoint. [facility], rx for Oxy 5mg  from [facility] will not be given to patient at discharge.  Discharge instructions and follow up info on AVS.   LOS: 30 days    Wellington [place], [facility] 12/15/2020, 9:02 AM Please see Amion for pager number during day hours 7:00am-4:30pm

## 2020-12-18 ENCOUNTER — Other Ambulatory Visit: Payer: Self-pay

## 2020-12-18 ENCOUNTER — Inpatient Hospital Stay: Payer: Self-pay | Attending: Oncology | Admitting: Oncology

## 2020-12-18 ENCOUNTER — Encounter: Payer: Self-pay | Admitting: *Deleted

## 2020-12-18 ENCOUNTER — Encounter (HOSPITAL_COMMUNITY): Payer: Self-pay | Admitting: Oncology

## 2020-12-18 ENCOUNTER — Other Ambulatory Visit: Payer: Self-pay | Admitting: *Deleted

## 2020-12-18 VITALS — BP 111/76 | HR 91 | Temp 97.9°F | Resp 17 | Ht 75.0 in | Wt 137.4 lb

## 2020-12-18 DIAGNOSIS — D509 Iron deficiency anemia, unspecified: Secondary | ICD-10-CM | POA: Insufficient documentation

## 2020-12-18 DIAGNOSIS — C787 Secondary malignant neoplasm of liver and intrahepatic bile duct: Secondary | ICD-10-CM | POA: Insufficient documentation

## 2020-12-18 DIAGNOSIS — Z5111 Encounter for antineoplastic chemotherapy: Secondary | ICD-10-CM | POA: Insufficient documentation

## 2020-12-18 DIAGNOSIS — C180 Malignant neoplasm of cecum: Secondary | ICD-10-CM | POA: Insufficient documentation

## 2020-12-18 DIAGNOSIS — C189 Malignant neoplasm of colon, unspecified: Secondary | ICD-10-CM

## 2020-12-18 DIAGNOSIS — Z79899 Other long term (current) drug therapy: Secondary | ICD-10-CM | POA: Insufficient documentation

## 2020-12-18 DIAGNOSIS — Z800 Family history of malignant neoplasm of digestive organs: Secondary | ICD-10-CM | POA: Insufficient documentation

## 2020-12-18 DIAGNOSIS — Z8616 Personal history of COVID-19: Secondary | ICD-10-CM | POA: Insufficient documentation

## 2020-12-18 DIAGNOSIS — Z7189 Other specified counseling: Secondary | ICD-10-CM

## 2020-12-18 DIAGNOSIS — D75839 Thrombocytosis, unspecified: Secondary | ICD-10-CM | POA: Insufficient documentation

## 2020-12-18 MED ORDER — PROCHLORPERAZINE MALEATE 10 MG PO TABS: 10.0000 mg | ORAL_TABLET | Freq: Four times a day (QID) | ORAL | 1 refills | Status: DC | PRN

## 2020-12-18 MED ORDER — LIDOCAINE-PRILOCAINE 2.5-2.5 % EX CREA: 1.0000 "application " | TOPICAL_CREAM | CUTANEOUS | 2 refills | Status: DC

## 2020-12-18 MED ORDER — ONDANSETRON HCL 8 MG PO TABS: 8.0000 mg | ORAL_TABLET | Freq: Three times a day (TID) | ORAL | 1 refills | Status: DC | PRN

## 2020-12-18 NOTE — Progress Notes (Signed)
[place] OFFICE PROGRESS NOTE   Diagnosis: Colon cancer  INTERVAL HISTORY:   I saw Eric [name] when he was hospitalized last month with a perforated cecal cancer.  He was diagnosed with metastatic colon cancer.  He underwent drainage of a pelvic abscess, a right hemicolectomy/ileostomy, and liver biopsy while in the hospital.  He was discharged to home on 12/15/2020.  Eric [name] is here today with his sister.  He reports the ileostomy is functioning well.  He empties the bag 2-3 times per day.  The stool is soft.  He continues to have post surgical pain and right leg pain.  He is eating.  Objective:  Vital signs in last 24 hours:  Blood pressure 111/76, pulse 91, temperature 97.9 F (36.6 C), temperature source Tympanic, resp. rate 17, height _0  (1.905 m), weight 137 lb 6.4 oz (62.3 kg), SpO2 100 %.    Resp: Lungs clear bilaterally Cardio: Regular rate and rhythm GI: No hepatosplenomegaly, right abdomen ileostomy with soft brown stool, healed midline incision, there is leakage of stool at the medial aspect of the ostomy appliance Vascular: No leg edema  Portacath/PICC-without erythema  Lab Results:  Lab Results  Component Value Date   WBC 9.0 12/13/2020   HGB 8.5 (L) 12/13/2020   HCT 27.0 (L) 12/13/2020   MCV 80.8 12/13/2020   PLT 554 (H) 12/13/2020   NEUTROABS 6.2 12/09/2020    CMP  Lab Results  Component Value Date   NA 132 (L) 12/13/2020   K 4.1 12/13/2020   CL 98 12/13/2020   CO2 25 12/13/2020   GLUCOSE 131 (H) 12/13/2020   BUN 13 12/13/2020   CREATININE 0.63 12/13/2020   CALCIUM 9.1 12/13/2020   PROT 6.0 (L) 12/12/2020   ALBUMIN 2.5 (L) 12/12/2020   AST 18 12/12/2020   ALT 31 12/12/2020   ALKPHOS 231 (H) 12/12/2020   BILITOT 0.7 12/12/2020   GFRNONAA >60 12/13/2020    Lab Results  Component Value Date   CEA1 0.8 11/19/2020     Medications: I have reviewed the patient's current medications.   Assessment/Plan: 1.  Stage IV  adenocarcinoma of the colon -11/15/2020 CT abdomen/pelvis with contrast-long segment masslike mural thickening of the cecum, posterior perforation at the site of the cecal wall thickening with multilocular right retroperitoneal abscess, indeterminate hypodensity in the inferior right lobe of the liver. -11/19/2020 CEA 0.8 -11/20/2020 MRI of the abdomen and pelvis with and without contrast-3.5 x 2.5 cm heterogeneous enhancing lesion in the inferior peripheral right liver with 2 smaller lesions with similar features. -11/20/2020 ultrasound-guided liver biopsy consistent with adenocarcinoma.  Foundation 1-MSS, tumor mutation burden 8,KRAS wt -11/25/2020 CT abdomen/pelvis with contrast-interval placement of a right lower quadrant percutaneous pigtail drainage catheter with near resolution of the retroperitoneal fluid collection, redemonstrated large fungating mass at the cecal base measuring 11.9 x 9.1 x 8.4 cm with enlarged lymph nodes in the right lower quadrant mesocolon. -11/28/2020 exploratory laparotomy, right colectomy, ileostomy creation, cystoscopy with stent placement             -Surgical pathology showed adenocarcinoma, moderately differentiated, 12 cm, 0/14+ lymph nodes, lymphovascular space invasion present, radial margin involved by invasive adenocarcinoma, appendix with intraluminal             adenocarcinoma, pT4, PN 0 -12/09/2020 CT abdomen/pelvis with contrast-interval right colectomy and right lower quadrant ileostomy, stable right retroperitoneal soft tissue density along the psoas and iliacus muscles, mild progression of liver metastases since prior study. 2.  Sepsis secondary to perforated colon 3. Retroperitoneal abscess 4. Protein calorie malnutrition 5. Thrombocytosis 6. Iron deficiency anemia 7. COVID-19 infection, 11/15/2020 8. Tobacco abuse  9.Family history of colon cancer 10.  Port-A-Cath placement, Dr. [name] 12/12/2020    Disposition: Eric [name] has been diagnosed with  metastatic colon cancer.  We discussed the prognosis and treatment options.  No therapy will be curative.  I recommend initial treatment with FOLFOX.  We reviewed the FOLFOX regimen and potential toxicities when he was in the hospital last week.  We reviewed toxicities again today including the chance of hematologic toxicity, infection, bleeding, and neuropathy.  He will attend a chemotherapy teaching class.  The plan is to begin FOLFOX on 12/23/2020.  He will return for an office visit and cycle 2 FOLFOX on 01/06/2021.  The ileostomy leakage appeared related to a midline wound bandaged undermining the ostomy dressing.  This was replaced today.  A chemotherapy plan was entered today.  [name], MD  12/18/2020  12:03 PM

## 2020-12-18 NOTE — Progress Notes (Signed)
Anti-emetics and EMLA cream sent to his pharmacy.

## 2020-12-18 NOTE — Progress Notes (Signed)
Patient presents to office w/sister, Eric [name] for hospital follow up to discuss treatment plan. Patient lives in small home w/male roommate. 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 He will be staying with his sister, Eric [name] during chemo and his sister, Eric [name] will provide transportation. He enjoys walking and fishing. Reports some balance issues and uses a cane when he is outside. Tells RN that ileostomy output  Is 600-800 ml liquid/day. Says he is eating better now and drinking Ensure 2-3/day. Provided him coupons for Ensure.

## 2020-12-18 NOTE — Progress Notes (Signed)
START ON PATHWAY REGIMEN - Colorectal     A cycle is every 14 days:     Oxaliplatin      Leucovorin      Fluorouracil      Fluorouracil   **Always confirm dose/schedule in your pharmacy ordering system**  Patient Characteristics: Distant Metastases, Nonsurgical Candidate, KRAS/NRAS Wild-Type (BRAF V600 Wild-Type/Unknown), Standard Cytotoxic Therapy, First Line Standard Cytotoxic Therapy, Right-sided Tumor, PS = 0,1, Bevacizumab Ineligible Tumor Location: Colon Therapeutic Status: Distant Metastases Microsatellite/Mismatch Repair Status: MSS/pMMR BRAF Mutation Status: Wild-Type (no mutation) KRAS/NRAS Mutation Status: Wild-Type (no mutation) Preferred Therapy Approach: Standard Cytotoxic Therapy Standard Cytotoxic Line of Therapy: First Line Standard Cytotoxic Therapy ECOG Performance Status: 1 Primary Tumor Location: Right-sided Tumor Bevacizumab Eligibility: Ineligible Intent of Therapy: Non-Curative / Palliative Intent, Discussed with Patient 

## 2020-12-18 NOTE — Progress Notes (Signed)
Met with patient and his sister today at his hospital follow up with Dr. Benay Spice.  He had previously discussed treatment with Folfox while he was in the hospital.  He will attend a chemo education class on Monday 3/14 at 8:00 am as there was no earlier availability.  His ostomy was leaking a significant amount of stool because of the surgical ABD pad.  I have taken all that off, cleaned up the patient and replaced the ring and bag with what I have hear at [facility].  I explained he may need to replace it with the ones he was sent home with if this is not working appropriately.  He and his sister verbalized an understanding.

## 2020-12-18 NOTE — Progress Notes (Signed)
[place] Psychosocial Distress Screening Clinical Social Work  Clinical Social Work was referred by medical oncology and distress screening protocol.  The patient scored a 6 on the Psychosocial Distress Thermometer which indicates moderate distress. Clinical Social Worker contacted patient by phone to assess for distress and other psychosocial needs.  CSW spoke with patient and his sister.  Patient stated his last day of work was February 4 and his primary concern was financial, insurance and returning to work.  CSW and patient discussed the resources and support available at [facility].  Patient stated he spoke to someone in the hospital regarding medicaid.  Patient was told he was "not eligible due to a life insurance policy".  Patient could not recall who he spoke with; although it is documented that he was referred to [facility] on 11/21/20.  CSW left messages for the inpatient financial navigator and [facility] for clarification, awaiting return call.  CSW and patient discussed social security disability.  Patient stated he was hopeful to return to work but would like to explore resources.  CSW and patient discussed [facility] and patient was agreeable to a referral.  CSW completed and submitted referral to [facility].  Patient will need to sign ROI at his next appointment.  CSW team will follow up with patient with additional information as needed.  CSW encouraged patient to call with questions or concerns.             ONCBCN DISTRESS SCREENING 12/18/2020  Screening Type Initial Screening  Distress experienced in past week (1-10) 6  Practical problem type Insurance  Emotional problem type Nervousness/Anxiety;Adjusting to illness;Adjusting to appearance changes  Information Concerns Type Lack of info about diagnosis;Lack of info about treatment  Physical Problem type Pain;Nausea/vomiting;Sleep/insomnia;Bathing/dressing;Tingling hands/feet  Physician notified of physical  symptoms Yes  Referral to clinical social work Yes  Referral to dietition Yes  Referral to financial advocate No  Referral to support programs No  Referral to palliative care No    Johnnye Lana, MSW, LCSW, OSW-C Clinical Social Worker [place] [phone]

## 2020-12-19 ENCOUNTER — Encounter: Payer: Self-pay | Admitting: General Practice

## 2020-12-19 ENCOUNTER — Telehealth: Payer: Self-pay | Admitting: Oncology

## 2020-12-19 NOTE — Telephone Encounter (Signed)
Scheduled appointments per 3/9 los. Spoke to patient who is aware of appointments dates and times.  

## 2020-12-19 NOTE — Progress Notes (Signed)
[place] CSW Progress Notes  Followed up on work done by [employer] re [employer] application.  Spoke w Elie Confer Revels, [employer] [phone]) - she was following his account.  States he is currently over Medicaid income limits, must spend down his assets in order to come under the $2000 limit.  K Revels will call patient to further explain the process.  CSW spoke directly with patient and explained the above.  CSW Dalene Seltzer has referred him to [facility] for disability application - he has their number to follow up with them.  He states he has friends/family helping him w food, transportation and living expenses.  He has funds saved up and will use these as needed.  [place] asked to contact him re Advertising account executive at his next visit.  He does have a form of coverage which provides direct reimbursement to patient for some portion of medical expenses - patient needs to submit bills directly to this entity.  Copy of card is in his document list.  At this time, patient is not working, has been out of work since Feb 2022.  Advised that at some point he can contact DSS for Food Stamps application, he will need to be under income limits for this program.  He can receive assistance from the [facility] food pantry as needed. He states his social support network and his own resources are sufficient for his needs at this time.  Edwyna Shell, LCSW Clinical Social Worker Phone:  [phone]

## 2020-12-22 ENCOUNTER — Other Ambulatory Visit: Payer: Self-pay | Admitting: Oncology

## 2020-12-23 ENCOUNTER — Other Ambulatory Visit: Payer: Self-pay

## 2020-12-23 ENCOUNTER — Inpatient Hospital Stay: Payer: Self-pay

## 2020-12-24 ENCOUNTER — Other Ambulatory Visit: Payer: Self-pay | Admitting: Oncology

## 2020-12-24 ENCOUNTER — Inpatient Hospital Stay: Payer: Self-pay

## 2020-12-24 VITALS — BP 119/63 | HR 90 | Temp 97.8°F | Resp 17

## 2020-12-24 DIAGNOSIS — C189 Malignant neoplasm of colon, unspecified: Secondary | ICD-10-CM

## 2020-12-24 DIAGNOSIS — C787 Secondary malignant neoplasm of liver and intrahepatic bile duct: Secondary | ICD-10-CM

## 2020-12-24 DIAGNOSIS — K631 Perforation of intestine (nontraumatic): Secondary | ICD-10-CM

## 2020-12-24 LAB — CMP (CANCER CENTER ONLY)
ALT: 13 U/L (ref 0–44)
AST: 15 U/L (ref 15–41)
Albumin: 3.6 g/dL (ref 3.5–5.0)
Alkaline Phosphatase: 106 U/L (ref 38–126)
Anion gap: 6 (ref 5–15)
BUN: 11 mg/dL (ref 6–20)
CO2: 28 mmol/L (ref 22–32)
Calcium: 9.3 mg/dL (ref 8.9–10.3)
Chloride: 102 mmol/L (ref 98–111)
Creatinine: 0.7 mg/dL (ref 0.61–1.24)
GFR, Estimated: >60 mL/min (ref >60)
Glucose, Bld: 107 mg/dL — ABNORMAL HIGH (ref 70–99)
Potassium: 4.1 mmol/L (ref 3.5–5.1)
Sodium: 136 mmol/L (ref 135–145)
Total Bilirubin: 0.2 mg/dL — ABNORMAL LOW (ref 0.3–1.2)
Total Protein: 6.4 g/dL — ABNORMAL LOW (ref 6.5–8.1)

## 2020-12-24 LAB — CBC WITH DIFFERENTIAL (CANCER CENTER ONLY)
Abs Immature Granulocytes: 0.07 10*3/uL (ref 0.00–0.07)
Basophils Absolute: 0.1 10*3/uL (ref 0.0–0.1)
Basophils Relative: 1 %
Eosinophils Absolute: 0.5 10*3/uL (ref 0.0–0.5)
Eosinophils Relative: 5 %
HCT: 29.9 % — ABNORMAL LOW (ref 39.0–52.0)
Hemoglobin: 9.2 g/dL — ABNORMAL LOW (ref 13.0–17.0)
Immature Granulocytes: 1 %
Lymphocytes Relative: 15 %
Lymphs Abs: 1.7 10*3/uL (ref 0.7–4.0)
MCH: 25.2 pg — ABNORMAL LOW (ref 26.0–34.0)
MCHC: 30.8 g/dL (ref 30.0–36.0)
MCV: 81.9 fL (ref 80.0–100.0)
Monocytes Absolute: 0.9 10*3/uL (ref 0.1–1.0)
Monocytes Relative: 8 %
Neutro Abs: 8 10*3/uL — ABNORMAL HIGH (ref 1.7–7.7)
Neutrophils Relative %: 70 %
Platelet Count: 478 10*3/uL — ABNORMAL HIGH (ref 150–400)
RBC: 3.65 MIL/uL — ABNORMAL LOW (ref 4.22–5.81)
RDW: 24.1 % — ABNORMAL HIGH (ref 11.5–15.5)
WBC Count: 11.3 10*3/uL — ABNORMAL HIGH (ref 4.0–10.5)
nRBC: 0 % (ref 0.0–0.2)

## 2020-12-24 MED ORDER — OXALIPLATIN CHEMO INJECTION 100 MG/20ML
83.0000 mg/m2 | Freq: Once | INTRAVENOUS | Status: AC
  Administered 2020-12-24: 150 mg via INTRAVENOUS
  Filled 2020-12-24: qty 20

## 2020-12-24 MED ORDER — PALONOSETRON HCL INJECTION 0.25 MG/5ML
0.2500 mg | Freq: Once | INTRAVENOUS | Status: AC
  Administered 2020-12-24: 0.25 mg via INTRAVENOUS

## 2020-12-24 MED ORDER — FLUOROURACIL CHEMO INJECTION 2.5 GM/50ML
400.0000 mg/m2 | Freq: Once | INTRAVENOUS | Status: AC
  Administered 2020-12-24: 750 mg via INTRAVENOUS
  Filled 2020-12-24: qty 15

## 2020-12-24 MED ORDER — SODIUM CHLORIDE 0.9% FLUSH
10.0000 mL | INTRAVENOUS | Status: DC | PRN
  Filled 2020-12-24: qty 10

## 2020-12-24 MED ORDER — DEXTROSE 5 % IV SOLN
Freq: Once | INTRAVENOUS | Status: AC
  Filled 2020-12-24: qty 250

## 2020-12-24 MED ORDER — SODIUM CHLORIDE 0.9 % IV SOLN
2400.0000 mg/m2 | INTRAVENOUS | Status: DC
  Administered 2020-12-24: 4350 mg via INTRAVENOUS
  Filled 2020-12-24: qty 87

## 2020-12-24 MED ORDER — PALONOSETRON HCL INJECTION 0.25 MG/5ML
INTRAVENOUS | Status: AC
  Filled 2020-12-24: qty 5

## 2020-12-24 MED ORDER — SODIUM CHLORIDE 0.9 % IV SOLN
10.0000 mg | Freq: Once | INTRAVENOUS | Status: AC
  Administered 2020-12-24: 10 mg via INTRAVENOUS
  Filled 2020-12-24: qty 10

## 2020-12-24 MED ORDER — SODIUM CHLORIDE 0.9% FLUSH
10.0000 mL | Freq: Once | INTRAVENOUS | Status: AC
  Administered 2020-12-24: 10 mL via INTRAVENOUS
  Filled 2020-12-24: qty 10

## 2020-12-24 MED ORDER — LEUCOVORIN CALCIUM INJECTION 350 MG
385.0000 mg/m2 | Freq: Once | INTRAVENOUS | Status: AC
  Administered 2020-12-24: 700 mg via INTRAVENOUS
  Filled 2020-12-24: qty 35

## 2020-12-24 MED ORDER — HEPARIN SOD (PORK) LOCK FLUSH 100 UNIT/ML IV SOLN
500.0000 [IU] | Freq: Once | INTRAVENOUS | Status: DC | PRN
  Filled 2020-12-24: qty 5

## 2020-12-24 NOTE — Progress Notes (Signed)
No PA, okay to treat today per Ulice Dash, AD Revenue cycle.

## 2020-12-24 NOTE — Patient Instructions (Signed)
[facility] Discharge Instructions for Patients Receiving Chemotherapy  Today you received the following chemotherapy agents 5FU, Oxaliplatin  To help prevent nausea and vomiting after your treatment, we encourage you to take your nausea medication    If you develop nausea and vomiting that is not controlled by your nausea medication, call the clinic.   BELOW ARE SYMPTOMS THAT SHOULD BE REPORTED IMMEDIATELY:  *FEVER GREATER THAN 100.5 F  *CHILLS WITH OR WITHOUT FEVER  NAUSEA AND VOMITING THAT IS NOT CONTROLLED WITH YOUR NAUSEA MEDICATION  *UNUSUAL SHORTNESS OF BREATH  *UNUSUAL BRUISING OR BLEEDING  TENDERNESS IN MOUTH AND THROAT WITH OR WITHOUT PRESENCE OF ULCERS  *URINARY PROBLEMS  *BOWEL PROBLEMS  UNUSUAL RASH Items with * indicate a potential emergency and should be followed up as soon as possible.  Feel free to call the clinic should you have any questions or concerns. The clinic phone number is [phone].  Please show the CHEMO ALERT CARD at check-in to the Emergency Department and triage nurse.   

## 2020-12-24 NOTE — Patient Instructions (Signed)

## 2020-12-26 ENCOUNTER — Other Ambulatory Visit: Payer: Self-pay

## 2020-12-26 ENCOUNTER — Inpatient Hospital Stay: Payer: Self-pay

## 2020-12-26 VITALS — BP 118/82 | HR 105 | Temp 97.8°F | Resp 19

## 2020-12-26 DIAGNOSIS — C787 Secondary malignant neoplasm of liver and intrahepatic bile duct: Secondary | ICD-10-CM

## 2020-12-26 DIAGNOSIS — K631 Perforation of intestine (nontraumatic): Secondary | ICD-10-CM

## 2020-12-26 DIAGNOSIS — C189 Malignant neoplasm of colon, unspecified: Secondary | ICD-10-CM

## 2020-12-26 MED ORDER — HEPARIN SOD (PORK) LOCK FLUSH 100 UNIT/ML IV SOLN
500.0000 [IU] | Freq: Once | INTRAVENOUS | Status: AC | PRN
  Administered 2020-12-26: 500 [IU]
  Filled 2020-12-26: qty 5

## 2020-12-26 MED ORDER — SODIUM CHLORIDE 0.9% FLUSH
10.0000 mL | INTRAVENOUS | Status: DC | PRN
  Administered 2020-12-26: 10 mL
  Filled 2020-12-26: qty 10

## 2021-01-02 ENCOUNTER — Other Ambulatory Visit: Payer: Self-pay | Admitting: Oncology

## 2021-01-06 ENCOUNTER — Inpatient Hospital Stay (HOSPITAL_BASED_OUTPATIENT_CLINIC_OR_DEPARTMENT_OTHER): Payer: Self-pay | Admitting: Oncology

## 2021-01-06 ENCOUNTER — Other Ambulatory Visit: Payer: Self-pay

## 2021-01-06 ENCOUNTER — Inpatient Hospital Stay: Payer: Self-pay

## 2021-01-06 ENCOUNTER — Inpatient Hospital Stay: Payer: Self-pay | Admitting: Nutrition

## 2021-01-06 ENCOUNTER — Encounter: Payer: Self-pay | Admitting: Oncology

## 2021-01-06 VITALS — BP 122/93 | HR 90 | Temp 97.7°F | Resp 14 | Ht 75.0 in | Wt 146.8 lb

## 2021-01-06 DIAGNOSIS — K631 Perforation of intestine (nontraumatic): Secondary | ICD-10-CM

## 2021-01-06 DIAGNOSIS — C189 Malignant neoplasm of colon, unspecified: Secondary | ICD-10-CM

## 2021-01-06 DIAGNOSIS — C787 Secondary malignant neoplasm of liver and intrahepatic bile duct: Secondary | ICD-10-CM

## 2021-01-06 LAB — CBC WITH DIFFERENTIAL (CANCER CENTER ONLY)
Abs Immature Granulocytes: 0.05 10*3/uL (ref 0.00–0.07)
Basophils Absolute: 0.1 10*3/uL (ref 0.0–0.1)
Basophils Relative: 1 %
Eosinophils Absolute: 0.4 10*3/uL (ref 0.0–0.5)
Eosinophils Relative: 4 %
HCT: 33.1 % — ABNORMAL LOW (ref 39.0–52.0)
Hemoglobin: 10.2 g/dL — ABNORMAL LOW (ref 13.0–17.0)
Immature Granulocytes: 1 %
Lymphocytes Relative: 26 %
Lymphs Abs: 2.5 10*3/uL (ref 0.7–4.0)
MCH: 24.8 pg — ABNORMAL LOW (ref 26.0–34.0)
MCHC: 30.8 g/dL (ref 30.0–36.0)
MCV: 80.3 fL (ref 80.0–100.0)
Monocytes Absolute: 0.9 10*3/uL (ref 0.1–1.0)
Monocytes Relative: 9 %
Neutro Abs: 5.6 10*3/uL (ref 1.7–7.7)
Neutrophils Relative %: 59 %
Platelet Count: 445 10*3/uL — ABNORMAL HIGH (ref 150–400)
RBC: 4.12 MIL/uL — ABNORMAL LOW (ref 4.22–5.81)
RDW: 21.4 % — ABNORMAL HIGH (ref 11.5–15.5)
WBC Count: 9.6 10*3/uL (ref 4.0–10.5)
nRBC: 0 % (ref 0.0–0.2)

## 2021-01-06 LAB — CMP (CANCER CENTER ONLY)
ALT: 13 U/L (ref 0–44)
AST: 13 U/L — ABNORMAL LOW (ref 15–41)
Albumin: 3.4 g/dL — ABNORMAL LOW (ref 3.5–5.0)
Alkaline Phosphatase: 97 U/L (ref 38–126)
Anion gap: 10 (ref 5–15)
BUN: 9 mg/dL (ref 6–20)
CO2: 25 mmol/L (ref 22–32)
Calcium: 8.8 mg/dL — ABNORMAL LOW (ref 8.9–10.3)
Chloride: 102 mmol/L (ref 98–111)
Creatinine: 0.8 mg/dL (ref 0.61–1.24)
GFR, Estimated: >60 mL/min (ref >60)
Glucose, Bld: 99 mg/dL (ref 70–99)
Potassium: 4 mmol/L (ref 3.5–5.1)
Sodium: 137 mmol/L (ref 135–145)
Total Bilirubin: <0.2 mg/dL — ABNORMAL LOW (ref 0.3–1.2)
Total Protein: 6.9 g/dL (ref 6.5–8.1)

## 2021-01-06 MED ORDER — PALONOSETRON HCL INJECTION 0.25 MG/5ML
INTRAVENOUS | Status: AC
  Filled 2021-01-06: qty 5

## 2021-01-06 MED ORDER — OXYCODONE HCL 10 MG PO TABS: 5.0000 mg | ORAL_TABLET | Freq: Four times a day (QID) | ORAL | 0 refills | Status: DC | PRN

## 2021-01-06 MED ORDER — DEXTROSE 5 % IV SOLN
399.0000 mg/m2 | Freq: Once | INTRAVENOUS | Status: AC
  Administered 2021-01-06: 750 mg via INTRAVENOUS
  Filled 2021-01-06: qty 37.5

## 2021-01-06 MED ORDER — SODIUM CHLORIDE 0.9 % IV SOLN
2400.0000 mg/m2 | INTRAVENOUS | Status: DC
  Administered 2021-01-06: 4500 mg via INTRAVENOUS
  Filled 2021-01-06: qty 90

## 2021-01-06 MED ORDER — PALONOSETRON HCL INJECTION 0.25 MG/5ML
0.2500 mg | Freq: Once | INTRAVENOUS | Status: AC
  Administered 2021-01-06: 0.25 mg via INTRAVENOUS

## 2021-01-06 MED ORDER — OXALIPLATIN CHEMO INJECTION 100 MG/20ML
80.0000 mg/m2 | Freq: Once | INTRAVENOUS | Status: AC
  Administered 2021-01-06: 150 mg via INTRAVENOUS
  Filled 2021-01-06: qty 20

## 2021-01-06 MED ORDER — FLUOROURACIL CHEMO INJECTION 2.5 GM/50ML
400.0000 mg/m2 | Freq: Once | INTRAVENOUS | Status: AC
  Administered 2021-01-06: 750 mg via INTRAVENOUS
  Filled 2021-01-06: qty 15

## 2021-01-06 MED ORDER — DEXTROSE 5 % IV SOLN
Freq: Once | INTRAVENOUS | Status: AC
  Filled 2021-01-06: qty 250

## 2021-01-06 MED ORDER — SODIUM CHLORIDE 0.9 % IV SOLN
10.0000 mg | Freq: Once | INTRAVENOUS | Status: AC
  Administered 2021-01-06: 10 mg via INTRAVENOUS
  Filled 2021-01-06: qty 10

## 2021-01-06 MED ORDER — CALCIUM POLYCARBOPHIL 625 MG PO TABS: 625.0000 mg | ORAL_TABLET | Freq: Every day | ORAL | 1 refills | Status: DC

## 2021-01-06 NOTE — Progress Notes (Signed)
Met with patient in treatment room to introduce myself as Arboriculturist and to offer available resources.  Provided Good Faith Estimate and [employer]AA. According to notes from social worker, patient has to spend down some income resources, before he can be considered for Medicaid. Advised all uninsured patients receive a 57% discount for services billed through [employer] will determine if he qualifies for any more than that. Patient unsure about some coverage he said he has, advised him to reach out to the company.  Discussed one-time $1000 Radio broadcast assistant to assist with personal expenses while going through treatment.  Gave him my card if interested in applying and for any additional financial questions or concerns.

## 2021-01-06 NOTE — Patient Instructions (Signed)
[facility] Discharge Instructions for Patients Receiving Chemotherapy  Today you received the following chemotherapy agents oxaliplatin, leucovorin, fluorourcil.  To help prevent nausea and vomiting after your treatment, we encourage you to take your nausea medication as directed.   If you develop nausea and vomiting that is not controlled by your nausea medication, call the clinic.   BELOW ARE SYMPTOMS THAT SHOULD BE REPORTED IMMEDIATELY:  *FEVER GREATER THAN 100.5 F  *CHILLS WITH OR WITHOUT FEVER  NAUSEA AND VOMITING THAT IS NOT CONTROLLED WITH YOUR NAUSEA MEDICATION  *UNUSUAL SHORTNESS OF BREATH  *UNUSUAL BRUISING OR BLEEDING  TENDERNESS IN MOUTH AND THROAT WITH OR WITHOUT PRESENCE OF ULCERS  *URINARY PROBLEMS  *BOWEL PROBLEMS  UNUSUAL RASH Items with * indicate a potential emergency and should be followed up as soon as possible.  Feel free to call the clinic should you have any questions or concerns. The clinic phone number is [phone].  Please show the CHEMO ALERT CARD at check-in to the Emergency Department and triage nurse.   

## 2021-01-06 NOTE — Progress Notes (Signed)
[place] OFFICE PROGRESS NOTE   Diagnosis: Colon cancer  INTERVAL HISTORY:   Mr. Asato completed cycle 1 FOLFOX on 12/24/2020.  He reports mild nausea following chemotherapy, relieved with ondansetron.  He takes oxycodone once or twice daily for leg pain.  The ileostomy is functioning well.  He empties the bag approximately 4 times daily.  The stool is formed.  He had mild cold sensitivity following chemotherapy.  Objective:  Vital signs in last 24 hours:  Blood pressure (!) 122/93, pulse 90, temperature 97.7 F (36.5 C), temperature source Tympanic, resp. rate 14, height $RemoveBe'6\' 3"'[id number]$  (1.905 m), weight 146 lb 12.8 oz (66.6 kg), SpO2 100 %.    HEENT: No thrush or ulcer Resp: Lungs clear bilaterally Cardio: Regular rate and rhythm GI: No hepatosplenomegaly, mild tenderness in the left abdomen, right lower quadrant ileostomy Vascular: No leg edema  Skin: Palms with dryness, no erythema  Portacath/PICC-without erythema  Lab Results:  Lab Results  Component Value Date   WBC 9.6 01/06/2021   HGB 10.2 (L) 01/06/2021   HCT 33.1 (L) 01/06/2021   MCV 80.3 01/06/2021   PLT 445 (H) 01/06/2021   NEUTROABS 5.6 01/06/2021    CMP  Lab Results  Component Value Date   NA 136 12/24/2020   K 4.1 12/24/2020   CL 102 12/24/2020   CO2 28 12/24/2020   GLUCOSE 107 (H) 12/24/2020   BUN 11 12/24/2020   CREATININE 0.70 12/24/2020   CALCIUM 9.3 12/24/2020   PROT 6.4 (L) 12/24/2020   ALBUMIN 3.6 12/24/2020   AST 15 12/24/2020   ALT 13 12/24/2020   ALKPHOS 106 12/24/2020   BILITOT 0.2 (L) 12/24/2020   GFRNONAA >60 12/24/2020    Lab Results  Component Value Date   CEA1 0.8 11/19/2020     Medications: I have reviewed the patient's current medications.   Assessment/Plan:   1. Stage IV adenocarcinoma of the colon -11/15/2020 CT abdomen/pelvis with contrast-long segment masslike mural thickening of the cecum, posterior perforation at the site of the cecal wall thickening  with multilocular right retroperitoneal abscess, indeterminate hypodensity in the inferior right lobe of the liver. -11/19/2020 CEA 0.8 -11/20/2020 MRI of the abdomen and pelvis with and without contrast-3.5 x 2.5 cm heterogeneous enhancing lesion in the inferior peripheral right liver with 2 smaller lesions with similar features. -11/20/2020 ultrasound-guided liver biopsy consistent with adenocarcinoma.  Foundation 1-MSS, tumor mutation burden 8,KRAS wt -11/25/2020 CT abdomen/pelvis with contrast-interval placement of a right lower quadrant percutaneous pigtail drainage catheter with near resolution of the retroperitoneal fluid collection, redemonstrated large fungating mass at the cecal base measuring 11.9 x 9.1 x 8.4 cm with enlarged lymph nodes in the right lower quadrant mesocolon. -11/28/2020 exploratory laparotomy, right colectomy, ileostomy creation, cystoscopy with stent placement             -Surgical pathology showed adenocarcinoma, moderately differentiated, 12 cm, 0/14+ lymph nodes, lymphovascular space invasion present, radial margin involved by invasive adenocarcinoma, appendix with intraluminal             adenocarcinoma, pT4, PN 0 -12/09/2020 CT abdomen/pelvis with contrast-interval right colectomy and right lower quadrant ileostomy, stable right retroperitoneal soft tissue density along the psoas and iliacus muscles, mild progression of liver metastases since prior study. -Cycle 1 FOLFOX 12/24/2020 -Cycle 2 FOLFOX 01/06/2021 2. Sepsis secondary to perforated colon 3. Retroperitoneal abscess 4. Protein calorie malnutrition 5. Thrombocytosis 6. Iron deficiency anemia 7. COVID-19 infection, 11/15/2020 8. Tobacco abuse  9.Family history of colon cancer 10.  Port-A-Cath placement, Dr. [name] 12/12/2020     Disposition: Eric [name] tolerated the first cycle of FOLFOX well.  He will complete cycle 2 today.  I refilled his prescription for oxycodone.  He will return for an office visit and  chemotherapy in 2 weeks.  The plan to complete 5-6 cycles of FOLFOX prior to a restaging CT evaluation.  [name], MD  01/06/2021  9:40 AM

## 2021-01-06 NOTE — Progress Notes (Signed)
50 year old male diagnosed with metastatic colon cancer status post right hemicolectomy/ileostomy on February 17.  He is receiving cycle 2 of FOLFOX today and is followed by Dr. Benay Spice.  Past medical history not available.  Medications include Zofran, Protonix, FiberCon, and Compazine.  Labs include albumin 3.4.  Height: 6 feet 3 inches. Weight: 146 pounds on March 28. Usual body weight: 175 pounds per patient about 3 months ago. BMI: 18.35.  Patient reports he feels well.  States he is eating small amounts of food more often and he drinks a lot of water.  He empties his ileostomy bag about 4 times a day when it is about half full.  He is drinking 2 Ensure daily.  He had one episode of some mild nausea which improved with Zofran.  He has no concerns or questions regarding his ileostomy.  Nutrition diagnosis: Food and nutrition related knowledge deficit related to metastatic colon cancer and associated treatments as evidenced by no prior need for nutrition related information.  Intervention: Educated on importance of smaller more frequent meals and snacks with adequate calories and protein to promote repletion. Brief education on diet after ileostomy.  Provided written fact sheet. Provided coupons for Ensure and recommended patient continue 2-3 cartons daily.  Continue increased fluids. Continue medications as needed. Contact information provided.  Monitoring, evaluation, goals: Patient will tolerate adequate calories and protein to continue healing and for weight repletion.  Next visit: Patient to contact RD with questions.  **Disclaimer: This note was dictated with voice recognition software. Similar sounding words can inadvertently be transcribed and this note may contain transcription errors which may not have been corrected upon publication of note.**

## 2021-01-08 ENCOUNTER — Other Ambulatory Visit: Payer: Self-pay

## 2021-01-08 ENCOUNTER — Inpatient Hospital Stay: Payer: Self-pay

## 2021-01-08 VITALS — BP 100/72 | HR 109 | Temp 98.6°F | Resp 16

## 2021-01-08 DIAGNOSIS — K631 Perforation of intestine (nontraumatic): Secondary | ICD-10-CM

## 2021-01-08 DIAGNOSIS — C189 Malignant neoplasm of colon, unspecified: Secondary | ICD-10-CM

## 2021-01-08 MED ORDER — HEPARIN SOD (PORK) LOCK FLUSH 100 UNIT/ML IV SOLN
500.0000 [IU] | Freq: Once | INTRAVENOUS | Status: AC | PRN
  Administered 2021-01-08: 500 [IU]
  Filled 2021-01-08: qty 5

## 2021-01-08 MED ORDER — SODIUM CHLORIDE 0.9% FLUSH
10.0000 mL | INTRAVENOUS | Status: DC | PRN
  Administered 2021-01-08: 10 mL
  Filled 2021-01-08: qty 10

## 2021-01-08 NOTE — Patient Instructions (Signed)
Implanted Port Insertion, Care After This sheet gives you information about how to care for yourself after your procedure. Your health care provider may also give you more specific instructions. If you have problems or questions, contact your health care provider. What can I expect after the procedure? After the procedure, it is common to have:  Discomfort at the port insertion site.  Bruising on the skin over the port. This should improve over 3-4 days. Follow these instructions at home: Port care  After your port is placed, you will get a manufacturer's information card. The card has information about your port. Keep this card with you at all times.  Take care of the port as told by your health care provider. Ask your health care provider if you or a family member can get training for taking care of the port at home. A home health care nurse may also take care of the port.  Make sure to remember what type of port you have. Incision care  Follow instructions from your health care provider about how to take care of your port insertion site. Make sure you: ? Wash your hands with soap and water before and after you change your bandage (dressing). If soap and water are not available, use hand sanitizer. ? Change your dressing as told by your health care provider. ? Leave stitches (sutures), skin glue, or adhesive strips in place. These skin closures may need to stay in place for 2 weeks or longer. If adhesive strip edges start to loosen and curl up, you may trim the loose edges. Do not remove adhesive strips completely unless your health care provider tells you to do that.  Check your port insertion site every day for signs of infection. Check for: ? Redness, swelling, or pain. ? Fluid or blood. ? Warmth. ? Pus or a bad smell.      Activity  Return to your normal activities as told by your health care provider. Ask your health care provider what activities are safe for you.  Do not  lift anything that is heavier than 10 lb (4.5 kg), or the limit that you are told, until your health care provider says that it is safe. General instructions  Take over-the-counter and prescription medicines only as told by your health care provider.  Do not take baths, swim, or use a hot tub until your health care provider approves. Ask your health care provider if you may take showers. You may only be allowed to take sponge baths.  Do not drive for 24 hours if you were given a sedative during your procedure.  Wear a medical alert bracelet in case of an emergency. This will tell any health care providers that you have a port.  Keep all follow-up visits as told by your health care provider. This is important. Contact a health care provider if:  You cannot flush your port with saline as directed, or you cannot draw blood from the port.  You have a fever or chills.  You have redness, swelling, or pain around your port insertion site.  You have fluid or blood coming from your port insertion site.  Your port insertion site feels warm to the touch.  You have pus or a bad smell coming from the port insertion site. Get help right away if:  You have chest pain or shortness of breath.  You have bleeding from your port that you cannot control. Summary  Take care of the port as told by your   health care provider. Keep the manufacturer's information card with you at all times.  Change your dressing as told by your health care provider.  Contact a health care provider if you have a fever or chills or if you have redness, swelling, or pain around your port insertion site.  Keep all follow-up visits as told by your health care provider. This information is not intended to replace advice given to you by your health care provider. Make sure you discuss any questions you have with your health care provider. Document Revised: 04/26/2018 Document Reviewed: 04/26/2018 Elsevier Patient Education   2021 Elsevier Inc.  

## 2021-01-16 ENCOUNTER — Other Ambulatory Visit: Payer: Self-pay

## 2021-01-16 ENCOUNTER — Encounter (HOSPITAL_COMMUNITY): Payer: Self-pay | Admitting: General Surgery

## 2021-01-16 MED ORDER — PANTOPRAZOLE SODIUM 40 MG PO TBEC: 40.0000 mg | DELAYED_RELEASE_TABLET | Freq: Every day | ORAL | 0 refills | Status: DC

## 2021-01-18 ENCOUNTER — Other Ambulatory Visit: Payer: Self-pay | Admitting: Oncology

## 2021-01-20 ENCOUNTER — Telehealth: Payer: Self-pay | Admitting: Nurse Practitioner

## 2021-01-20 ENCOUNTER — Inpatient Hospital Stay (HOSPITAL_BASED_OUTPATIENT_CLINIC_OR_DEPARTMENT_OTHER): Payer: Self-pay | Admitting: Nurse Practitioner

## 2021-01-20 ENCOUNTER — Inpatient Hospital Stay: Payer: Self-pay | Attending: Oncology

## 2021-01-20 ENCOUNTER — Inpatient Hospital Stay: Payer: Self-pay

## 2021-01-20 ENCOUNTER — Encounter: Payer: Self-pay | Admitting: Nurse Practitioner

## 2021-01-20 ENCOUNTER — Other Ambulatory Visit: Payer: Self-pay

## 2021-01-20 VITALS — BP 118/80 | HR 96 | Temp 97.6°F | Resp 17 | Wt 150.4 lb

## 2021-01-20 VITALS — BP 123/88 | HR 82 | Temp 97.8°F | Resp 20

## 2021-01-20 DIAGNOSIS — C787 Secondary malignant neoplasm of liver and intrahepatic bile duct: Secondary | ICD-10-CM | POA: Insufficient documentation

## 2021-01-20 DIAGNOSIS — K6819 Other retroperitoneal abscess: Secondary | ICD-10-CM

## 2021-01-20 DIAGNOSIS — E43 Unspecified severe protein-calorie malnutrition: Secondary | ICD-10-CM

## 2021-01-20 DIAGNOSIS — A419 Sepsis, unspecified organism: Secondary | ICD-10-CM

## 2021-01-20 DIAGNOSIS — Z5111 Encounter for antineoplastic chemotherapy: Secondary | ICD-10-CM | POA: Insufficient documentation

## 2021-01-20 DIAGNOSIS — C189 Malignant neoplasm of colon, unspecified: Secondary | ICD-10-CM

## 2021-01-20 DIAGNOSIS — C184 Malignant neoplasm of transverse colon: Secondary | ICD-10-CM

## 2021-01-20 DIAGNOSIS — Z9049 Acquired absence of other specified parts of digestive tract: Secondary | ICD-10-CM

## 2021-01-20 DIAGNOSIS — Z8616 Personal history of COVID-19: Secondary | ICD-10-CM

## 2021-01-20 DIAGNOSIS — F17290 Nicotine dependence, other tobacco product, uncomplicated: Secondary | ICD-10-CM

## 2021-01-20 DIAGNOSIS — K631 Perforation of intestine (nontraumatic): Secondary | ICD-10-CM

## 2021-01-20 DIAGNOSIS — C180 Malignant neoplasm of cecum: Secondary | ICD-10-CM | POA: Insufficient documentation

## 2021-01-20 DIAGNOSIS — D75839 Thrombocytosis, unspecified: Secondary | ICD-10-CM

## 2021-01-20 DIAGNOSIS — Z79899 Other long term (current) drug therapy: Secondary | ICD-10-CM

## 2021-01-20 DIAGNOSIS — Z800 Family history of malignant neoplasm of digestive organs: Secondary | ICD-10-CM

## 2021-01-20 DIAGNOSIS — D509 Iron deficiency anemia, unspecified: Secondary | ICD-10-CM

## 2021-01-20 LAB — CMP (CANCER CENTER ONLY)
ALT: 21 U/L (ref 0–44)
AST: 18 U/L (ref 15–41)
Albumin: 3.9 g/dL (ref 3.5–5.0)
Alkaline Phosphatase: 77 U/L (ref 38–126)
Anion gap: 6 (ref 5–15)
BUN: 7 mg/dL (ref 6–20)
CO2: 27 mmol/L (ref 22–32)
Calcium: 9.2 mg/dL (ref 8.9–10.3)
Chloride: 103 mmol/L (ref 98–111)
Creatinine: 0.87 mg/dL (ref 0.61–1.24)
GFR, Estimated: >60 mL/min (ref >60)
Glucose, Bld: 94 mg/dL (ref 70–99)
Potassium: 4.1 mmol/L (ref 3.5–5.1)
Sodium: 136 mmol/L (ref 135–145)
Total Bilirubin: 0.3 mg/dL (ref 0.3–1.2)
Total Protein: 7.1 g/dL (ref 6.5–8.1)

## 2021-01-20 LAB — CBC WITH DIFFERENTIAL (CANCER CENTER ONLY)
Abs Immature Granulocytes: 0.03 10*3/uL (ref 0.00–0.07)
Basophils Absolute: 0.1 10*3/uL (ref 0.0–0.1)
Basophils Relative: 1 %
Eosinophils Absolute: 0.3 10*3/uL (ref 0.0–0.5)
Eosinophils Relative: 4 %
HCT: 32.9 % — ABNORMAL LOW (ref 39.0–52.0)
Hemoglobin: 10.3 g/dL — ABNORMAL LOW (ref 13.0–17.0)
Immature Granulocytes: 0 %
Lymphocytes Relative: 25 %
Lymphs Abs: 2.1 10*3/uL (ref 0.7–4.0)
MCH: 25.4 pg — ABNORMAL LOW (ref 26.0–34.0)
MCHC: 31.3 g/dL (ref 30.0–36.0)
MCV: 81 fL (ref 80.0–100.0)
Monocytes Absolute: 1 10*3/uL (ref 0.1–1.0)
Monocytes Relative: 12 %
Neutro Abs: 5 10*3/uL (ref 1.7–7.7)
Neutrophils Relative %: 58 %
Platelet Count: 274 10*3/uL (ref 150–400)
RBC: 4.06 MIL/uL — ABNORMAL LOW (ref 4.22–5.81)
RDW: 19 % — ABNORMAL HIGH (ref 11.5–15.5)
WBC Count: 8.5 10*3/uL (ref 4.0–10.5)
nRBC: 0 % (ref 0.0–0.2)

## 2021-01-20 MED ORDER — LEUCOVORIN CALCIUM INJECTION 350 MG
400.0000 mg/m2 | Freq: Once | INTRAVENOUS | Status: AC
  Administered 2021-01-20: 752 mg via INTRAVENOUS
  Filled 2021-01-20: qty 37.6

## 2021-01-20 MED ORDER — DEXTROSE 5 % IV SOLN
Freq: Once | INTRAVENOUS | Status: AC
Start: 2021-01-20 — End: 2021-01-20
  Filled 2021-01-20: qty 250

## 2021-01-20 MED ORDER — HEPARIN SOD (PORK) LOCK FLUSH 100 UNIT/ML IV SOLN
500.0000 [IU] | Freq: Once | INTRAVENOUS | Status: DC | PRN
  Filled 2021-01-20: qty 5

## 2021-01-20 MED ORDER — OXALIPLATIN CHEMO INJECTION 100 MG/20ML
85.0000 mg/m2 | Freq: Once | INTRAVENOUS | Status: AC
  Administered 2021-01-20: 160 mg via INTRAVENOUS
  Filled 2021-01-20: qty 32

## 2021-01-20 MED ORDER — SODIUM CHLORIDE 0.9 % IV SOLN
2400.0000 mg/m2 | INTRAVENOUS | Status: DC
  Administered 2021-01-20: 4500 mg via INTRAVENOUS
  Filled 2021-01-20: qty 90

## 2021-01-20 MED ORDER — PALONOSETRON HCL INJECTION 0.25 MG/5ML
0.2500 mg | Freq: Once | INTRAVENOUS | Status: AC
  Administered 2021-01-20: 0.25 mg via INTRAVENOUS
  Filled 2021-01-20: qty 5

## 2021-01-20 MED ORDER — SODIUM CHLORIDE 0.9 % IV SOLN
10.0000 mg | Freq: Once | INTRAVENOUS | Status: AC
  Administered 2021-01-20: 10 mg via INTRAVENOUS
  Filled 2021-01-20: qty 1

## 2021-01-20 MED ORDER — SODIUM CHLORIDE 0.9% FLUSH
10.0000 mL | INTRAVENOUS | Status: DC | PRN
  Filled 2021-01-20: qty 10

## 2021-01-20 MED ORDER — OXYCODONE HCL 10 MG PO TABS: 5.0000 mg | ORAL_TABLET | Freq: Four times a day (QID) | ORAL | 0 refills | Status: DC | PRN

## 2021-01-20 MED ORDER — FLUOROURACIL CHEMO INJECTION 2.5 GM/50ML
400.0000 mg/m2 | Freq: Once | INTRAVENOUS | Status: AC
  Administered 2021-01-20: 750 mg via INTRAVENOUS
  Filled 2021-01-20: qty 15

## 2021-01-20 NOTE — Progress Notes (Signed)
  [facility] OFFICE PROGRESS NOTE   Diagnosis: Colon cancer  INTERVAL HISTORY:   Eric [name] returns as scheduled.  He completed cycle 2 FOLFOX 01/06/2021.  He had a single episode of nausea/vomiting.  He attributes this to drinking chocolate milk which he does not tolerate.  He does not feel it was related to the chemotherapy.  No mouth sores.  No diarrhea.  Cold sensitivity lasted "a couple of days".  No persistent neuropathy symptoms.  He has intermittent right abdominal and right leg pain.  He takes oxycodone as needed.  Objective:  Vital signs in last 24 hours:  Blood pressure 118/80, pulse 96, temperature 97.6 F (36.4 C), temperature source Oral, resp. rate 17, weight 150 lb 6.4 oz (68.2 kg), SpO2 99 %.    HEENT: No thrush or ulcers. Resp: Lungs clear bilaterally. Cardio: Regular rate and rhythm. GI: Abdomen soft and nontender.  No hepatomegaly.  Right abdomen ileostomy. Vascular: No leg edema.  Skin: Palms without erythema. Port-A-Cath without erythema.   Lab Results:  Lab Results  Component Value Date   WBC 8.5 01/20/2021   HGB 10.3 (L) 01/20/2021   HCT 32.9 (L) 01/20/2021   MCV 81.0 01/20/2021   PLT 274 01/20/2021   NEUTROABS 5.0 01/20/2021    Imaging:  No results found.  Medications: I have reviewed the patient's current medications.  Assessment/Plan: 1. Stage IV adenocarcinoma of the colon -11/15/2020 CT abdomen/pelvis with contrast-long segment masslike mural thickening of the cecum, posterior perforation at the site of the cecal wall thickening with multilocular right retroperitoneal abscess, indeterminate hypodensity in the inferior right lobe of the liver. -11/19/2020 CEA 0.8 -11/20/2020 MRI of the abdomen and pelvis with and without contrast-3.5 x 2.5 cm heterogeneous enhancing lesion in the inferior peripheral right liver with 2 smaller lesions with similar features. -11/20/2020 ultrasound-guided liver biopsy consistent with adenocarcinoma.   Foundation 1-MSS, tumor mutation burden 8,KRAS wt -11/25/2020 CT abdomen/pelvis with contrast-interval placement of a right lower quadrant percutaneous pigtail drainage catheter with near resolution of the retroperitoneal fluid collection, redemonstrated large fungating mass at the cecal base measuring 11.9 x 9.1 x 8.4 cm with enlarged lymph nodes in the right lower quadrant mesocolon. -11/28/2020 exploratory laparotomy, right colectomy, ileostomy creation, cystoscopy with stent placement -Surgical pathology showed adenocarcinoma, moderately differentiated, 12 cm, 0/14+ lymph nodes, lymphovascular space invasion present, radial margin involved by invasive adenocarcinoma, appendix with intraluminal adenocarcinoma, pT4, PN 0 -12/09/2020 CT abdomen/pelvis with contrast-interval right colectomy and right lower quadrant ileostomy, stable right retroperitoneal soft tissue density along the psoas and iliacusmuscles, mild progression of liver metastases since prior study. -Cycle 1 FOLFOX 12/24/2020 -Cycle 2 FOLFOX 01/06/2021 -Cycle 3 FOLFOX 01/20/2021 2. Sepsis secondary to perforated colon 3. Retroperitoneal abscess 4. Protein calorie malnutrition 5. Thrombocytosis 6. Iron deficiency anemia 7. COVID-19 infection, 11/15/2020 8. Tobacco abuse  9.Family history of colon cancer 10.  Port-A-Cath placement, Dr. [name] 12/12/2020   Disposition: Eric [name] appears stable.  He has completed 2 cycles of FOLFOX.  He is tolerating chemotherapy well.  Plan to proceed with cycle 3 today as scheduled.  We reviewed the CBC from today.  Counts adequate to proceed with treatment.  Refill for oxycodone sent to pharmacy.  He will return for lab, follow-up, cycle 4 FOLFOX in 2 weeks.  He will contact the office in the interim with any problems.    Ned Card ANP/GNP-BC   01/20/2021  9:27 AM

## 2021-01-20 NOTE — Patient Instructions (Signed)
[facility] Discharge Instructions for Patients Receiving Chemotherapy  Today you received the following chemotherapy agents Oxaliplatin, Leucovorin, Fluorouracil  To help prevent nausea and vomiting after your treatment, we encourage you to take your nausea medication: take compazine on 4/11, 4/12, 4/13. On 4/14 may restart taking Zofran (ondansetron) if needed for nausea.     If you develop nausea and vomiting that is not controlled by your nausea medication, call the clinic.   BELOW ARE SYMPTOMS THAT SHOULD BE REPORTED IMMEDIATELY:  *FEVER GREATER THAN 100.5 F  *CHILLS WITH OR WITHOUT FEVER  NAUSEA AND VOMITING THAT IS NOT CONTROLLED WITH YOUR NAUSEA MEDICATION  *UNUSUAL SHORTNESS OF BREATH  *UNUSUAL BRUISING OR BLEEDING  TENDERNESS IN MOUTH AND THROAT WITH OR WITHOUT PRESENCE OF ULCERS  *URINARY PROBLEMS  *BOWEL PROBLEMS  UNUSUAL RASH Items with * indicate a potential emergency and should be followed up as soon as possible.  Feel free to call the clinic should you have any questions or concerns at The clinic phone number is [phone].  Please show the [place] at check-in to the Emergency Department and triage nurse.

## 2021-01-20 NOTE — Progress Notes (Signed)
Patient tolerated chemo without any problems. Patient discharged ambulatory in stable condition.

## 2021-01-20 NOTE — Telephone Encounter (Signed)
Scheduled appt per 4/11 los - pt to get an updated schedule in infusion

## 2021-01-21 ENCOUNTER — Inpatient Hospital Stay: Payer: Self-pay | Admitting: Internal Medicine

## 2021-01-22 ENCOUNTER — Other Ambulatory Visit: Payer: Self-pay

## 2021-01-22 ENCOUNTER — Inpatient Hospital Stay: Payer: Self-pay

## 2021-01-22 VITALS — BP 106/79 | HR 102 | Temp 98.4°F | Resp 20

## 2021-01-22 DIAGNOSIS — C189 Malignant neoplasm of colon, unspecified: Secondary | ICD-10-CM

## 2021-01-22 DIAGNOSIS — K631 Perforation of intestine (nontraumatic): Secondary | ICD-10-CM

## 2021-01-22 MED ORDER — SODIUM CHLORIDE 0.9% FLUSH
10.0000 mL | INTRAVENOUS | Status: DC | PRN
  Administered 2021-01-22: 10 mL
  Filled 2021-01-22: qty 10

## 2021-01-22 MED ORDER — HEPARIN SOD (PORK) LOCK FLUSH 100 UNIT/ML IV SOLN
500.0000 [IU] | Freq: Once | INTRAVENOUS | Status: AC | PRN
  Administered 2021-01-22: 500 [IU]
  Filled 2021-01-22: qty 5

## 2021-02-02 ENCOUNTER — Other Ambulatory Visit: Payer: Self-pay | Admitting: Oncology

## 2021-02-03 ENCOUNTER — Telehealth: Payer: Self-pay | Admitting: Oncology

## 2021-02-03 ENCOUNTER — Other Ambulatory Visit: Payer: Self-pay

## 2021-02-03 ENCOUNTER — Other Ambulatory Visit: Payer: Self-pay | Admitting: *Deleted

## 2021-02-03 ENCOUNTER — Inpatient Hospital Stay: Payer: Self-pay

## 2021-02-03 ENCOUNTER — Inpatient Hospital Stay (HOSPITAL_BASED_OUTPATIENT_CLINIC_OR_DEPARTMENT_OTHER): Payer: Self-pay | Admitting: Oncology

## 2021-02-03 VITALS — BP 129/91 | HR 87 | Temp 98.0°F | Resp 18 | Ht 75.0 in | Wt 153.6 lb

## 2021-02-03 DIAGNOSIS — C189 Malignant neoplasm of colon, unspecified: Secondary | ICD-10-CM

## 2021-02-03 DIAGNOSIS — C787 Secondary malignant neoplasm of liver and intrahepatic bile duct: Secondary | ICD-10-CM

## 2021-02-03 DIAGNOSIS — C180 Malignant neoplasm of cecum: Secondary | ICD-10-CM

## 2021-02-03 DIAGNOSIS — K631 Perforation of intestine (nontraumatic): Secondary | ICD-10-CM

## 2021-02-03 LAB — CBC WITH DIFFERENTIAL (CANCER CENTER ONLY)
Abs Immature Granulocytes: 0.02 10*3/uL (ref 0.00–0.07)
Basophils Absolute: 0.1 10*3/uL (ref 0.0–0.1)
Basophils Relative: 2 %
Eosinophils Absolute: 0.4 10*3/uL (ref 0.0–0.5)
Eosinophils Relative: 7 %
HCT: 31.5 % — ABNORMAL LOW (ref 39.0–52.0)
Hemoglobin: 9.9 g/dL — ABNORMAL LOW (ref 13.0–17.0)
Immature Granulocytes: 0 %
Lymphocytes Relative: 39 %
Lymphs Abs: 2.3 10*3/uL (ref 0.7–4.0)
MCH: 25.8 pg — ABNORMAL LOW (ref 26.0–34.0)
MCHC: 31.4 g/dL (ref 30.0–36.0)
MCV: 82.2 fL (ref 80.0–100.0)
Monocytes Absolute: 0.8 10*3/uL (ref 0.1–1.0)
Monocytes Relative: 14 %
Neutro Abs: 2.2 10*3/uL (ref 1.7–7.7)
Neutrophils Relative %: 38 %
Platelet Count: 198 10*3/uL (ref 150–400)
RBC: 3.83 MIL/uL — ABNORMAL LOW (ref 4.22–5.81)
RDW: 17.2 % — ABNORMAL HIGH (ref 11.5–15.5)
WBC Count: 5.8 10*3/uL (ref 4.0–10.5)
nRBC: 0 % (ref 0.0–0.2)

## 2021-02-03 LAB — CMP (CANCER CENTER ONLY)
ALT: 14 U/L (ref 0–44)
AST: 16 U/L (ref 15–41)
Albumin: 3.9 g/dL (ref 3.5–5.0)
Alkaline Phosphatase: 75 U/L (ref 38–126)
Anion gap: 9 (ref 5–15)
BUN: 8 mg/dL (ref 6–20)
CO2: 26 mmol/L (ref 22–32)
Calcium: 9.3 mg/dL (ref 8.9–10.3)
Chloride: 100 mmol/L (ref 98–111)
Creatinine: 0.81 mg/dL (ref 0.61–1.24)
GFR, Estimated: >60 mL/min (ref >60)
Glucose, Bld: 127 mg/dL — ABNORMAL HIGH (ref 70–99)
Potassium: 3.7 mmol/L (ref 3.5–5.1)
Sodium: 135 mmol/L (ref 135–145)
Total Bilirubin: 0.2 mg/dL — ABNORMAL LOW (ref 0.3–1.2)
Total Protein: 6.9 g/dL (ref 6.5–8.1)

## 2021-02-03 MED ORDER — PANTOPRAZOLE SODIUM 40 MG PO TBEC: 40.0000 mg | DELAYED_RELEASE_TABLET | Freq: Every day | ORAL | 2 refills | Status: DC

## 2021-02-03 MED ORDER — LEUCOVORIN CALCIUM INJECTION 350 MG
400.0000 mg/m2 | Freq: Once | INTRAVENOUS | Status: AC
  Administered 2021-02-03: 752 mg via INTRAVENOUS
  Filled 2021-02-03: qty 37.6

## 2021-02-03 MED ORDER — SODIUM CHLORIDE 0.9% FLUSH
10.0000 mL | INTRAVENOUS | Status: DC | PRN
  Filled 2021-02-03: qty 10

## 2021-02-03 MED ORDER — SODIUM CHLORIDE 0.9 % IV SOLN
2400.0000 mg/m2 | INTRAVENOUS | Status: DC
  Administered 2021-02-03: 4500 mg via INTRAVENOUS
  Filled 2021-02-03: qty 90

## 2021-02-03 MED ORDER — PALONOSETRON HCL INJECTION 0.25 MG/5ML
0.2500 mg | Freq: Once | INTRAVENOUS | Status: AC
  Administered 2021-02-03: 0.25 mg via INTRAVENOUS
  Filled 2021-02-03: qty 5

## 2021-02-03 MED ORDER — HEPARIN SOD (PORK) LOCK FLUSH 100 UNIT/ML IV SOLN
500.0000 [IU] | Freq: Once | INTRAVENOUS | Status: DC | PRN
  Filled 2021-02-03: qty 5

## 2021-02-03 MED ORDER — FLUOROURACIL CHEMO INJECTION 2.5 GM/50ML
400.0000 mg/m2 | Freq: Once | INTRAVENOUS | Status: AC
  Administered 2021-02-03: 750 mg via INTRAVENOUS
  Filled 2021-02-03: qty 15

## 2021-02-03 MED ORDER — PROCHLORPERAZINE MALEATE 10 MG PO TABS: 10.0000 mg | ORAL_TABLET | Freq: Four times a day (QID) | ORAL | 1 refills | Status: DC | PRN

## 2021-02-03 MED ORDER — ONDANSETRON HCL 8 MG PO TABS: 8.0000 mg | ORAL_TABLET | Freq: Three times a day (TID) | ORAL | 1 refills | Status: DC | PRN

## 2021-02-03 MED ORDER — OXALIPLATIN CHEMO INJECTION 100 MG/20ML
85.0000 mg/m2 | Freq: Once | INTRAVENOUS | Status: AC
  Administered 2021-02-03: 160 mg via INTRAVENOUS
  Filled 2021-02-03: qty 32

## 2021-02-03 MED ORDER — DEXTROSE 5 % IV SOLN
Freq: Once | INTRAVENOUS | Status: AC
Start: 2021-02-03 — End: 2021-02-03
  Filled 2021-02-03: qty 250

## 2021-02-03 MED ORDER — SODIUM CHLORIDE 0.9 % IV SOLN
10.0000 mg | Freq: Once | INTRAVENOUS | Status: AC
  Administered 2021-02-03: 10 mg via INTRAVENOUS
  Filled 2021-02-03: qty 1

## 2021-02-03 MED ORDER — DEXTROSE 5 % IV SOLN
Freq: Once | INTRAVENOUS | Status: AC
  Filled 2021-02-03: qty 250

## 2021-02-03 MED ORDER — GABAPENTIN 300 MG PO CAPS: 1.0000 | ORAL_CAPSULE | Freq: Three times a day (TID) | ORAL | 1 refills | Status: DC

## 2021-02-03 NOTE — Progress Notes (Signed)
Patient tolerated chemo without any problems. Discharged ambulatory in stable condition.  

## 2021-02-03 NOTE — Telephone Encounter (Signed)
Appointments scheduled with patient in infusion per 4/25 los 

## 2021-02-03 NOTE — Progress Notes (Signed)
[facility] OFFICE PROGRESS NOTE   Diagnosis: Colon cancer  INTERVAL HISTORY:   Eric [name] completed another cycle of FOLFOX on 01/20/2021.  No nausea/vomiting, mouth sores, or diarrhea.  He has cold sensitivity for a few days following chemotherapy.  No other neuropathy symptoms.  The ileostomy is functioning well.  He empties the bag approximately 5 times per day.  Leg pain has improved.  He takes oxycodone occasionally.  Objective:  Vital signs in last 24 hours:  Blood pressure (!) 129/91, pulse 87, temperature 98 F (36.7 C), temperature source Tympanic, resp. rate 18, height _0  (1.905 m), weight 153 lb 9.6 oz (69.7 kg), SpO2 100 %.    HEENT: No thrush or ulcers Resp: Lungs clear bilaterally Cardio: Regular rate and rhythm GI: Nontender, no hepatosplenomegaly, right lower quadrant ileostomy Vascular: No leg edema    Portacath/PICC-without erythema  Lab Results:  Lab Results  Component Value Date   WBC 5.8 02/03/2021   HGB 9.9 (L) 02/03/2021   HCT 31.5 (L) 02/03/2021   MCV 82.2 02/03/2021   PLT 198 02/03/2021   NEUTROABS 2.2 02/03/2021    CMP  Lab Results  Component Value Date   NA 136 01/20/2021   K 4.1 01/20/2021   CL 103 01/20/2021   CO2 27 01/20/2021   GLUCOSE 94 01/20/2021   BUN 7 01/20/2021   CREATININE 0.87 01/20/2021   CALCIUM 9.2 01/20/2021   PROT 7.1 01/20/2021   ALBUMIN 3.9 01/20/2021   AST 18 01/20/2021   ALT 21 01/20/2021   ALKPHOS 77 01/20/2021   BILITOT 0.3 01/20/2021   GFRNONAA >60 01/20/2021    Lab Results  Component Value Date   CEA1 0.8 11/19/2020    Medications: I have reviewed the patient's current medications.   Assessment/Plan:  1. Stage IV adenocarcinoma of the colon -11/15/2020 CT abdomen/pelvis with contrast-long segment masslike mural thickening of the cecum, posterior perforation at the site of the cecal wall thickening with multilocular right retroperitoneal abscess, indeterminate hypodensity in the  inferior right lobe of the liver. -11/19/2020 CEA 0.8 -11/20/2020 MRI of the abdomen and pelvis with and without contrast-3.5 x 2.5 cm heterogeneous enhancing lesion in the inferior peripheral right liver with 2 smaller lesions with similar features. -11/20/2020 ultrasound-guided liver biopsy consistent with adenocarcinoma.  Foundation 1-MSS, tumor mutation burden 8,KRAS wt -11/25/2020 CT abdomen/pelvis with contrast-interval placement of a right lower quadrant percutaneous pigtail drainage catheter with near resolution of the retroperitoneal fluid collection, redemonstrated large fungating mass at the cecal base measuring 11.9 x 9.1 x 8.4 cm with enlarged lymph nodes in the right lower quadrant mesocolon. -11/28/2020 exploratory laparotomy, right colectomy, ileostomy creation, cystoscopy with stent placement -Surgical pathology showed adenocarcinoma, moderately differentiated, 12 cm, 0/14+ lymph nodes, lymphovascular space invasion present, radial margin involved by invasive adenocarcinoma, appendix with intraluminal adenocarcinoma, pT4, PN 0 -12/09/2020 CT abdomen/pelvis with contrast-interval right colectomy and right lower quadrant ileostomy, stable right retroperitoneal soft tissue density along the psoas and iliacusmuscles, mild progression of liver metastases since prior study. -Cycle 1 FOLFOX 12/24/2020 -Cycle 2 FOLFOX 01/06/2021 -Cycle 3 FOLFOX 01/20/2021 -Cycle 4 FOLFOX 02/03/2021 2. Sepsis secondary to perforated colon 3. Retroperitoneal abscess 4. Protein calorie malnutrition 5. Thrombocytosis 6. Iron deficiency anemia 7. COVID-19 infection, 11/15/2020 8. Tobacco abuse  9.Family history of colon cancer 10.  Port-A-Cath placement, Dr. [name] 12/12/2020     Disposition: Mr. Dock appears stable.  He is tolerating chemotherapy well.  He will complete cycle 4 FOLFOX today.  He will  return for an office visit and chemotherapy in 2 weeks.  He will be referred for a  restaging CT after cycle 5 FOLFOX.  [name], MD  02/03/2021  9:17 AM

## 2021-02-05 ENCOUNTER — Other Ambulatory Visit: Payer: Self-pay

## 2021-02-05 ENCOUNTER — Inpatient Hospital Stay: Payer: Self-pay

## 2021-02-05 VITALS — BP 95/63 | HR 87 | Temp 98.0°F | Resp 18

## 2021-02-05 DIAGNOSIS — C189 Malignant neoplasm of colon, unspecified: Secondary | ICD-10-CM

## 2021-02-05 DIAGNOSIS — C787 Secondary malignant neoplasm of liver and intrahepatic bile duct: Secondary | ICD-10-CM

## 2021-02-05 DIAGNOSIS — K631 Perforation of intestine (nontraumatic): Secondary | ICD-10-CM

## 2021-02-05 MED ORDER — HEPARIN SOD (PORK) LOCK FLUSH 100 UNIT/ML IV SOLN
500.0000 [IU] | Freq: Once | INTRAVENOUS | Status: AC | PRN
  Administered 2021-02-05: 500 [IU]
  Filled 2021-02-05: qty 5

## 2021-02-05 MED ORDER — SODIUM CHLORIDE 0.9% FLUSH
10.0000 mL | INTRAVENOUS | Status: DC | PRN
  Administered 2021-02-05: 10 mL
  Filled 2021-02-05: qty 10

## 2021-02-16 ENCOUNTER — Other Ambulatory Visit: Payer: Self-pay | Admitting: Oncology

## 2021-02-17 ENCOUNTER — Inpatient Hospital Stay: Payer: Self-pay

## 2021-02-17 ENCOUNTER — Inpatient Hospital Stay (HOSPITAL_BASED_OUTPATIENT_CLINIC_OR_DEPARTMENT_OTHER): Payer: Self-pay | Admitting: Oncology

## 2021-02-17 ENCOUNTER — Other Ambulatory Visit: Payer: Self-pay

## 2021-02-17 ENCOUNTER — Inpatient Hospital Stay: Payer: Self-pay | Attending: Oncology

## 2021-02-17 VITALS — BP 117/87 | HR 97 | Temp 97.8°F | Resp 18 | Ht 75.0 in | Wt 157.2 lb

## 2021-02-17 DIAGNOSIS — C189 Malignant neoplasm of colon, unspecified: Secondary | ICD-10-CM

## 2021-02-17 DIAGNOSIS — Z800 Family history of malignant neoplasm of digestive organs: Secondary | ICD-10-CM

## 2021-02-17 DIAGNOSIS — Z8616 Personal history of COVID-19: Secondary | ICD-10-CM

## 2021-02-17 DIAGNOSIS — C184 Malignant neoplasm of transverse colon: Secondary | ICD-10-CM

## 2021-02-17 DIAGNOSIS — C787 Secondary malignant neoplasm of liver and intrahepatic bile duct: Secondary | ICD-10-CM | POA: Insufficient documentation

## 2021-02-17 DIAGNOSIS — F17290 Nicotine dependence, other tobacco product, uncomplicated: Secondary | ICD-10-CM

## 2021-02-17 DIAGNOSIS — K631 Perforation of intestine (nontraumatic): Secondary | ICD-10-CM

## 2021-02-17 DIAGNOSIS — Z79899 Other long term (current) drug therapy: Secondary | ICD-10-CM | POA: Insufficient documentation

## 2021-02-17 DIAGNOSIS — E46 Unspecified protein-calorie malnutrition: Secondary | ICD-10-CM

## 2021-02-17 DIAGNOSIS — D75839 Thrombocytosis, unspecified: Secondary | ICD-10-CM

## 2021-02-17 DIAGNOSIS — D509 Iron deficiency anemia, unspecified: Secondary | ICD-10-CM

## 2021-02-17 DIAGNOSIS — A419 Sepsis, unspecified organism: Secondary | ICD-10-CM

## 2021-02-17 DIAGNOSIS — C180 Malignant neoplasm of cecum: Secondary | ICD-10-CM | POA: Insufficient documentation

## 2021-02-17 DIAGNOSIS — Z9049 Acquired absence of other specified parts of digestive tract: Secondary | ICD-10-CM

## 2021-02-17 DIAGNOSIS — Z5111 Encounter for antineoplastic chemotherapy: Secondary | ICD-10-CM | POA: Insufficient documentation

## 2021-02-17 LAB — CBC WITH DIFFERENTIAL (CANCER CENTER ONLY)
Abs Immature Granulocytes: 0.02 10*3/uL (ref 0.00–0.07)
Basophils Absolute: 0.1 10*3/uL (ref 0.0–0.1)
Basophils Relative: 1 %
Eosinophils Absolute: 0.4 10*3/uL (ref 0.0–0.5)
Eosinophils Relative: 7 %
HCT: 32.5 % — ABNORMAL LOW (ref 39.0–52.0)
Hemoglobin: 10.2 g/dL — ABNORMAL LOW (ref 13.0–17.0)
Immature Granulocytes: 0 %
Lymphocytes Relative: 33 %
Lymphs Abs: 2 10*3/uL (ref 0.7–4.0)
MCH: 26.2 pg (ref 26.0–34.0)
MCHC: 31.4 g/dL (ref 30.0–36.0)
MCV: 83.3 fL (ref 80.0–100.0)
Monocytes Absolute: 0.8 10*3/uL (ref 0.1–1.0)
Monocytes Relative: 13 %
Neutro Abs: 2.7 10*3/uL (ref 1.7–7.7)
Neutrophils Relative %: 46 %
Platelet Count: 170 10*3/uL (ref 150–400)
RBC: 3.9 MIL/uL — ABNORMAL LOW (ref 4.22–5.81)
RDW: 17.2 % — ABNORMAL HIGH (ref 11.5–15.5)
WBC Count: 6 10*3/uL (ref 4.0–10.5)
nRBC: 0 % (ref 0.0–0.2)

## 2021-02-17 LAB — CMP (CANCER CENTER ONLY)
ALT: 22 U/L (ref 0–44)
AST: 25 U/L (ref 15–41)
Albumin: 3.7 g/dL (ref 3.5–5.0)
Alkaline Phosphatase: 84 U/L (ref 38–126)
Anion gap: 9 (ref 5–15)
BUN: 9 mg/dL (ref 6–20)
CO2: 24 mmol/L (ref 22–32)
Calcium: 8.6 mg/dL — ABNORMAL LOW (ref 8.9–10.3)
Chloride: 104 mmol/L (ref 98–111)
Creatinine: 0.86 mg/dL (ref 0.61–1.24)
GFR, Estimated: >60 mL/min (ref >60)
Glucose, Bld: 96 mg/dL (ref 70–99)
Potassium: 3.8 mmol/L (ref 3.5–5.1)
Sodium: 137 mmol/L (ref 135–145)
Total Bilirubin: 0.2 mg/dL — ABNORMAL LOW (ref 0.3–1.2)
Total Protein: 6.9 g/dL (ref 6.5–8.1)

## 2021-02-17 MED ORDER — SODIUM CHLORIDE 0.9% FLUSH
10.0000 mL | INTRAVENOUS | Status: DC | PRN
Start: 2021-02-17 — End: 2021-02-17
  Administered 2021-02-17: 10 mL
  Filled 2021-02-17: qty 10

## 2021-02-17 MED ORDER — DEXTROSE 5 % IV SOLN
Freq: Once | INTRAVENOUS | Status: AC
  Filled 2021-02-17: qty 250

## 2021-02-17 MED ORDER — LEUCOVORIN CALCIUM INJECTION 350 MG
400.0000 mg/m2 | Freq: Once | INTRAVENOUS | Status: AC
  Administered 2021-02-17: 768 mg via INTRAVENOUS
  Filled 2021-02-17: qty 38.4

## 2021-02-17 MED ORDER — PALONOSETRON HCL INJECTION 0.25 MG/5ML
0.2500 mg | Freq: Once | INTRAVENOUS | Status: AC
  Administered 2021-02-17: 0.25 mg via INTRAVENOUS
  Filled 2021-02-17: qty 5

## 2021-02-17 MED ORDER — DEXAMETHASONE SODIUM PHOSPHATE 100 MG/10ML IJ SOLN
10.0000 mg | Freq: Once | INTRAMUSCULAR | Status: AC
  Administered 2021-02-17: 10 mg via INTRAVENOUS
  Filled 2021-02-17: qty 10

## 2021-02-17 MED ORDER — OXYCODONE HCL 10 MG PO TABS: 5.0000 mg | ORAL_TABLET | Freq: Four times a day (QID) | ORAL | 0 refills | Status: DC | PRN

## 2021-02-17 MED ORDER — OXALIPLATIN CHEMO INJECTION 100 MG/20ML
85.0000 mg/m2 | Freq: Once | INTRAVENOUS | Status: AC
  Administered 2021-02-17: 165 mg via INTRAVENOUS
  Filled 2021-02-17: qty 33

## 2021-02-17 MED ORDER — SODIUM CHLORIDE 0.9 % IV SOLN
2400.0000 mg/m2 | INTRAVENOUS | Status: DC
  Administered 2021-02-17: 4600 mg via INTRAVENOUS
  Filled 2021-02-17: qty 92

## 2021-02-17 MED ORDER — FLUOROURACIL CHEMO INJECTION 2.5 GM/50ML
400.0000 mg/m2 | Freq: Once | INTRAVENOUS | Status: AC
  Administered 2021-02-17: 750 mg via INTRAVENOUS
  Filled 2021-02-17: qty 15

## 2021-02-17 NOTE — Patient Instructions (Signed)

## 2021-02-17 NOTE — Patient Instructions (Signed)
[facility] Discharge Instructions for Patients Receiving Chemotherapy  Today you received the following chemotherapy agents Oxaliplatin, Leucovorin, Fluorouracil  To help prevent nausea and vomiting after your treatment, we encourage you to take your nausea medication: take compazine on 5/9, 5/10, 5/11. On 5/12 may restart taking Zofran (ondansetron) if needed for nausea.     If you develop nausea and vomiting that is not controlled by your nausea medication, call the clinic.   BELOW ARE SYMPTOMS THAT SHOULD BE REPORTED IMMEDIATELY:  *FEVER GREATER THAN 100.5 F  *CHILLS WITH OR WITHOUT FEVER  NAUSEA AND VOMITING THAT IS NOT CONTROLLED WITH YOUR NAUSEA MEDICATION  *UNUSUAL SHORTNESS OF BREATH  *UNUSUAL BRUISING OR BLEEDING  TENDERNESS IN MOUTH AND THROAT WITH OR WITHOUT PRESENCE OF ULCERS  *URINARY PROBLEMS  *BOWEL PROBLEMS  UNUSUAL RASH Items with * indicate a potential emergency and should be followed up as soon as possible.  Feel free to call the clinic should you have any questions or concerns at The clinic phone number is [phone].  Please show the [place] at check-in to the Emergency Department and triage nurse.

## 2021-02-17 NOTE — Progress Notes (Signed)
[name]   Diagnosis: Colon cancer  INTERVAL HISTORY:   [name] completed another cycle of FOLFOX on 02/03/2021.  No nausea/vomiting or diarrhea.  He has cold sensitivity for a few days following chemotherapy.  No neuropathy symptoms at present.  He continues to have tingling in the right leg.  He takes gabapentin.  This helps.  He takes oxycodone occasionally.  The ostomy is functioning well.  Objective:  Vital signs in last 24 hours:  Blood pressure 117/87, pulse 97, temperature 97.8 F (36.6 C), temperature source Oral, resp. rate 18, height _0  (1.905 m), weight 157 lb 3.2 oz (71.3 kg), SpO2 97 %.    HEENT: No thrush or ulcers Resp: Lungs clear bilaterally Cardio: Regular rate and rhythm GI: No hepatomegaly, right lower quadrant ileostomy, nontender Vascular: No leg edema  Skin: Mild hyperpigmentation of the hands  Portacath/PICC-without erythema  Lab Results:  Lab Results  Component Value Date   WBC 6.0 02/17/2021   HGB 10.2 (L) 02/17/2021   HCT 32.5 (L) 02/17/2021   MCV 83.3 02/17/2021   PLT 170 02/17/2021   NEUTROABS 2.7 02/17/2021    CMP  Lab Results  Component Value Date   NA 135 02/03/2021   K 3.7 02/03/2021   CL 100 02/03/2021   CO2 26 02/03/2021   GLUCOSE 127 (H) 02/03/2021   BUN 8 02/03/2021   CREATININE 0.81 02/03/2021   CALCIUM 9.3 02/03/2021   PROT 6.9 02/03/2021   ALBUMIN 3.9 02/03/2021   AST 16 02/03/2021   ALT 14 02/03/2021   ALKPHOS 75 02/03/2021   BILITOT 0.2 (L) 02/03/2021   GFRNONAA >60 02/03/2021    Lab Results  Component Value Date   CEA1 0.8 11/19/2020     Medications: I have reviewed the patient's current medications.   Assessment/Plan: 1. Stage IV adenocarcinoma of the colon -11/15/2020 CT abdomen/pelvis with contrast-long segment masslike mural thickening of the cecum, posterior perforation at the site of the cecal wall thickening with multilocular right retroperitoneal abscess,  indeterminate hypodensity in the inferior right lobe of the liver. -11/19/2020 CEA 0.8 -11/20/2020 MRI of the abdomen and pelvis with and without contrast-3.5 x 2.5 cm heterogeneous enhancing lesion in the inferior peripheral right liver with 2 smaller lesions with similar features. -11/20/2020 ultrasound-guided liver biopsy consistent with adenocarcinoma.  Foundation 1-MSS, tumor mutation burden 8,KRAS wt -11/25/2020 CT abdomen/pelvis with contrast-interval placement of a right lower quadrant percutaneous pigtail drainage catheter with near resolution of the retroperitoneal fluid collection, redemonstrated large fungating mass at the cecal base measuring 11.9 x 9.1 x 8.4 cm with enlarged lymph nodes in the right lower quadrant mesocolon. -11/28/2020 exploratory laparotomy, right colectomy, ileostomy creation, cystoscopy with stent placement -Surgical pathology showed adenocarcinoma, moderately differentiated, 12 cm, 0/14+ lymph nodes, lymphovascular space invasion present, radial margin involved by invasive adenocarcinoma, appendix with intraluminal adenocarcinoma, pT4, PN 0 -12/09/2020 CT abdomen/pelvis with contrast-interval right colectomy and right lower quadrant ileostomy, stable right retroperitoneal soft tissue density along the psoas and iliacusmuscles, mild progression of liver metastases since prior study. -Cycle 1 FOLFOX 12/24/2020 -Cycle 2 FOLFOX 01/06/2021 -Cycle 3 FOLFOX 01/20/2021 -Cycle 4 FOLFOX 02/03/2021 -Cycle 5 FOLFOX 02/17/2021 2. Sepsis secondary to perforated colon 3. Retroperitoneal abscess 4. Protein calorie malnutrition 5. Thrombocytosis 6. Iron deficiency anemia 7. COVID-19 infection, 11/15/2020 8. Tobacco abuse  9.Family history of colon cancer 10.  Port-A-Cath placement, Dr. [name] 12/12/2020   Disposition: Eric [name] appears stable.  He is tolerating the chemotherapy well.  He will complete cycle 5 FOLFOX today.  He will undergo a restaging CT  evaluation prior to an office visit in 2 weeks.  I refilled his prescription for oxycodone.  [name], MD  02/17/2021  9:26 AM

## 2021-02-19 ENCOUNTER — Other Ambulatory Visit: Payer: Self-pay

## 2021-02-19 ENCOUNTER — Inpatient Hospital Stay: Payer: Self-pay

## 2021-02-19 VITALS — BP 111/78 | HR 82 | Temp 97.8°F | Resp 18

## 2021-02-19 DIAGNOSIS — Z95828 Presence of other vascular implants and grafts: Secondary | ICD-10-CM

## 2021-02-19 MED ORDER — SODIUM CHLORIDE 0.9% FLUSH
10.0000 mL | Freq: Once | INTRAVENOUS | Status: AC
  Administered 2021-02-19: 10 mL via INTRAVENOUS
  Filled 2021-02-19: qty 10

## 2021-02-19 MED ORDER — HEPARIN SOD (PORK) LOCK FLUSH 100 UNIT/ML IV SOLN
500.0000 [IU] | Freq: Once | INTRAVENOUS | Status: AC
  Administered 2021-02-19: 500 [IU] via INTRAVENOUS
  Filled 2021-02-19: qty 5

## 2021-02-19 NOTE — Patient Instructions (Signed)

## 2021-02-28 ENCOUNTER — Ambulatory Visit (HOSPITAL_BASED_OUTPATIENT_CLINIC_OR_DEPARTMENT_OTHER): Payer: Self-pay

## 2021-02-28 ENCOUNTER — Inpatient Hospital Stay: Payer: Self-pay

## 2021-03-02 ENCOUNTER — Other Ambulatory Visit: Payer: Self-pay | Admitting: Oncology

## 2021-03-03 ENCOUNTER — Inpatient Hospital Stay: Payer: Self-pay

## 2021-03-03 ENCOUNTER — Ambulatory Visit (HOSPITAL_BASED_OUTPATIENT_CLINIC_OR_DEPARTMENT_OTHER)
Admission: RE | Admit: 2021-03-03 | Discharge: 2021-03-03 | Disposition: A | Discharge status: Home / Self Care | Payer: Self-pay | Source: Ambulatory Visit | Attending: Oncology | Admitting: Oncology

## 2021-03-03 ENCOUNTER — Encounter (HOSPITAL_BASED_OUTPATIENT_CLINIC_OR_DEPARTMENT_OTHER): Payer: Self-pay

## 2021-03-03 ENCOUNTER — Inpatient Hospital Stay (HOSPITAL_BASED_OUTPATIENT_CLINIC_OR_DEPARTMENT_OTHER): Payer: Self-pay | Admitting: Oncology

## 2021-03-03 ENCOUNTER — Other Ambulatory Visit: Payer: Self-pay

## 2021-03-03 VITALS — BP 121/86 | HR 83 | Temp 98.2°F | Resp 18 | Ht 75.0 in | Wt 151.4 lb

## 2021-03-03 DIAGNOSIS — C787 Secondary malignant neoplasm of liver and intrahepatic bile duct: Secondary | ICD-10-CM

## 2021-03-03 DIAGNOSIS — Z9049 Acquired absence of other specified parts of digestive tract: Secondary | ICD-10-CM

## 2021-03-03 DIAGNOSIS — K631 Perforation of intestine (nontraumatic): Secondary | ICD-10-CM

## 2021-03-03 DIAGNOSIS — C189 Malignant neoplasm of colon, unspecified: Secondary | ICD-10-CM

## 2021-03-03 DIAGNOSIS — D75839 Thrombocytosis, unspecified: Secondary | ICD-10-CM

## 2021-03-03 DIAGNOSIS — Z932 Ileostomy status: Secondary | ICD-10-CM

## 2021-03-03 DIAGNOSIS — Z800 Family history of malignant neoplasm of digestive organs: Secondary | ICD-10-CM

## 2021-03-03 DIAGNOSIS — A419 Sepsis, unspecified organism: Secondary | ICD-10-CM

## 2021-03-03 DIAGNOSIS — Z8616 Personal history of COVID-19: Secondary | ICD-10-CM

## 2021-03-03 DIAGNOSIS — Z720 Tobacco use: Secondary | ICD-10-CM

## 2021-03-03 LAB — CMP (CANCER CENTER ONLY)
ALT: 18 U/L (ref 0–44)
AST: 24 U/L (ref 15–41)
Albumin: 4 g/dL (ref 3.5–5.0)
Alkaline Phosphatase: 85 U/L (ref 38–126)
Anion gap: 11 (ref 5–15)
BUN: 13 mg/dL (ref 6–20)
CO2: 24 mmol/L (ref 22–32)
Calcium: 9.2 mg/dL (ref 8.9–10.3)
Chloride: 101 mmol/L (ref 98–111)
Creatinine: 1.06 mg/dL (ref 0.61–1.24)
GFR, Estimated: >60 mL/min (ref >60)
Glucose, Bld: 126 mg/dL — ABNORMAL HIGH (ref 70–99)
Potassium: 3.4 mmol/L — ABNORMAL LOW (ref 3.5–5.1)
Sodium: 136 mmol/L (ref 135–145)
Total Bilirubin: 0.5 mg/dL (ref 0.3–1.2)
Total Protein: 7.4 g/dL (ref 6.5–8.1)

## 2021-03-03 LAB — CBC WITH DIFFERENTIAL (CANCER CENTER ONLY)
Abs Immature Granulocytes: 0.02 10*3/uL (ref 0.00–0.07)
Basophils Absolute: 0.1 10*3/uL (ref 0.0–0.1)
Basophils Relative: 2 %
Eosinophils Absolute: 0.3 10*3/uL (ref 0.0–0.5)
Eosinophils Relative: 6 %
HCT: 32.9 % — ABNORMAL LOW (ref 39.0–52.0)
Hemoglobin: 10.6 g/dL — ABNORMAL LOW (ref 13.0–17.0)
Immature Granulocytes: 0 %
Lymphocytes Relative: 38 %
Lymphs Abs: 2 10*3/uL (ref 0.7–4.0)
MCH: 26.2 pg (ref 26.0–34.0)
MCHC: 32.2 g/dL (ref 30.0–36.0)
MCV: 81.4 fL (ref 80.0–100.0)
Monocytes Absolute: 0.7 10*3/uL (ref 0.1–1.0)
Monocytes Relative: 14 %
Neutro Abs: 2.2 10*3/uL (ref 1.7–7.7)
Neutrophils Relative %: 40 %
Platelet Count: 129 10*3/uL — ABNORMAL LOW (ref 150–400)
RBC: 4.04 MIL/uL — ABNORMAL LOW (ref 4.22–5.81)
RDW: 17.3 % — ABNORMAL HIGH (ref 11.5–15.5)
WBC Count: 5.4 10*3/uL (ref 4.0–10.5)
nRBC: 0 % (ref 0.0–0.2)

## 2021-03-03 MED ORDER — DEXTROSE 5 % IV SOLN
Freq: Once | INTRAVENOUS | Status: AC
  Filled 2021-03-03: qty 250

## 2021-03-03 MED ORDER — LEUCOVORIN CALCIUM INJECTION 350 MG
400.0000 mg/m2 | Freq: Once | INTRAVENOUS | Status: AC
  Administered 2021-03-03: 768 mg via INTRAVENOUS
  Filled 2021-03-03: qty 38.4

## 2021-03-03 MED ORDER — SODIUM CHLORIDE 0.9 % IV SOLN
5000.0000 mg | INTRAVENOUS | Status: DC
  Administered 2021-03-03: 5000 mg via INTRAVENOUS
  Filled 2021-03-03: qty 100

## 2021-03-03 MED ORDER — SODIUM CHLORIDE 0.9 % IV SOLN
10.0000 mg | Freq: Once | INTRAVENOUS | Status: AC
  Administered 2021-03-03: 10 mg via INTRAVENOUS
  Filled 2021-03-03: qty 1

## 2021-03-03 MED ORDER — IOHEXOL 300 MG/ML  SOLN
75.0000 mL | Freq: Once | INTRAMUSCULAR | Status: AC | PRN
  Administered 2021-03-03: 75 mL via INTRAVENOUS

## 2021-03-03 MED ORDER — PALONOSETRON HCL INJECTION 0.25 MG/5ML
0.2500 mg | Freq: Once | INTRAVENOUS | Status: AC
Start: 2021-03-03 — End: 2021-03-03
  Administered 2021-03-03: 0.25 mg via INTRAVENOUS
  Filled 2021-03-03: qty 5

## 2021-03-03 MED ORDER — FLUOROURACIL CHEMO INJECTION 2.5 GM/50ML
400.0000 mg/m2 | Freq: Once | INTRAVENOUS | Status: AC
  Administered 2021-03-03: 750 mg via INTRAVENOUS
  Filled 2021-03-03: qty 15

## 2021-03-03 MED ORDER — OXALIPLATIN CHEMO INJECTION 100 MG/20ML
85.0000 mg/m2 | Freq: Once | INTRAVENOUS | Status: AC
  Administered 2021-03-03: 165 mg via INTRAVENOUS
  Filled 2021-03-03: qty 33

## 2021-03-03 NOTE — Patient Instructions (Signed)
Eric [name]  Discharge Instructions: Thank you for choosing [facility] to provide your oncology and hematology care.   If you have a lab appointment with the [facility], please go directly to the [facility] and check in at the registration area.   Wear comfortable clothing and clothing appropriate for easy access to any Portacath or PICC line.   We strive to give you quality time with your provider. You may need to reschedule your appointment if you arrive late (15 or more minutes).  Arriving late affects you and other patients whose appointments are after yours.  Also, if you miss three or more appointments without notifying the office, you may be dismissed from the clinic at the provider's discretion.      For prescription refill requests, have your pharmacy contact our office and allow 72 hours for refills to be completed.    Today you received the following chemotherapy and/or immunotherapy agents Oxaliplatin, leucovorin, fluorouracil     To help prevent nausea and vomiting after your treatment, we encourage you to take your nausea medication as directed.  BELOW ARE SYMPTOMS THAT SHOULD BE REPORTED IMMEDIATELY: . *FEVER GREATER THAN 100.4 F (38 C) OR HIGHER . *CHILLS OR SWEATING . *NAUSEA AND VOMITING THAT IS NOT CONTROLLED WITH YOUR NAUSEA MEDICATION . *UNUSUAL SHORTNESS OF BREATH . *UNUSUAL BRUISING OR BLEEDING . *URINARY PROBLEMS (pain or burning when urinating, or frequent urination) . *BOWEL PROBLEMS (unusual diarrhea, constipation, pain near the anus) . TENDERNESS IN MOUTH AND THROAT WITH OR WITHOUT PRESENCE OF ULCERS (sore throat, sores in mouth, or a toothache) . UNUSUAL RASH, SWELLING OR PAIN  . UNUSUAL VAGINAL DISCHARGE OR ITCHING   Items with * indicate a potential emergency and should be followed up as soon as possible or go to the Emergency Department if any problems should occur.  Please show the CHEMOTHERAPY ALERT CARD  or IMMUNOTHERAPY ALERT CARD at check-in to the Emergency Department and triage nurse.  Should you have questions after your visit or need to cancel or reschedule your appointment, please contact [facility]  Dept: [phone]  and follow the prompts.  Office hours are 8:00 a.m. to 4:30 p.m. Monday - Friday. Please note that voicemails left after 4:00 p.m. may not be returned until the following business day.  We are closed weekends and major holidays. You have access to a nurse at all times for urgent questions. Please call the main number to the clinic Dept: [phone] and follow the prompts.   For any non-urgent questions, you may also contact your provider using MyChart. We now offer e-Visits for anyone 53 and older to request care online for non-urgent symptoms. For details visit [email or web address].   Also download the MyChart app! Go to the app store, search "MyChart", open the app, select [facility], and log in with your MyChart username and password.  Due to Covid, a mask is required upon entering the hospital/clinic. If you do not have a mask, one will be given to you upon arrival. For doctor visits, patients may have 1 support person aged 25 or older with them. For treatment visits, patients cannot have anyone with them due to current Covid guidelines and our immunocompromised population.   Oxaliplatin Injection What is this medicine? OXALIPLATIN (ox AL i PLA tin) is a chemotherapy drug. It targets fast dividing cells, like cancer cells, and causes these cells to die. This medicine is used to treat cancers of  the colon and rectum, and many other cancers. This medicine may be used for other purposes; ask your health care provider or pharmacist if you have questions. COMMON BRAND NAME(S): Eloxatin What should I tell my health care provider before I take this medicine? They need to know if you have any of these conditions:  heart disease  history of  irregular heartbeat  liver disease  low blood counts, like white cells, platelets, or red blood cells  lung or breathing disease, like asthma  take medicines that treat or prevent blood clots  tingling of the fingers or toes, or other nerve disorder  an unusual or allergic reaction to oxaliplatin, other chemotherapy, other medicines, foods, dyes, or preservatives  pregnant or trying to get pregnant  breast-feeding How should I use this medicine? This drug is given as an infusion into a vein. It is administered in a hospital or clinic by a specially trained health care professional. Talk to your pediatrician regarding the use of this medicine in children. Special care may be needed. Overdosage: If you think you have taken too much of this medicine contact a poison control center or emergency room at once. NOTE: This medicine is only for you. Do not share this medicine with others. What if I miss a dose? It is important not to miss a dose. Call your doctor or health care professional if you are unable to keep an appointment. What may interact with this medicine? Do not take this medicine with any of the following medications:  cisapride  dronedarone  pimozide  thioridazine This medicine may also interact with the following medications:  aspirin and aspirin-like medicines  certain medicines that treat or prevent blood clots like warfarin, apixaban, dabigatran, and rivaroxaban  cisplatin  cyclosporine  diuretics  medicines for infection like acyclovir, adefovir, amphotericin B, bacitracin, cidofovir, foscarnet, ganciclovir, gentamicin, pentamidine, vancomycin  NSAIDs, medicines for pain and inflammation, like ibuprofen or naproxen  other medicines that prolong the QT interval (an abnormal heart rhythm)  pamidronate  zoledronic acid This list may not describe all possible interactions. Give your health care provider a list of all the medicines, herbs,  non-prescription drugs, or dietary supplements you use. Also tell them if you smoke, drink alcohol, or use illegal drugs. Some items may interact with your medicine. What should I watch for while using this medicine? Your condition will be monitored carefully while you are receiving this medicine. You may need blood work done while you are taking this medicine. This medicine may make you feel generally unwell. This is not uncommon as chemotherapy can affect healthy cells as well as cancer cells. Report any side effects. Continue your course of treatment even though you feel ill unless your healthcare professional tells you to stop. This medicine can make you more sensitive to cold. Do not drink cold drinks or use ice. Cover exposed skin before coming in contact with cold temperatures or cold objects. When out in cold weather wear warm clothing and cover your mouth and nose to warm the air that goes into your lungs. Tell your doctor if you get sensitive to the cold. Do not become pregnant while taking this medicine or for 9 months after stopping it. Women should inform their health care professional if they wish to become pregnant or think they might be pregnant. Men should not father a child while taking this medicine and for 6 months after stopping it. There is potential for serious side effects to an unborn child. Talk to  your health care professional for more information. Do not breast-feed a child while taking this medicine or for 3 months after stopping it. This medicine has caused ovarian failure in some women. This medicine may make it more difficult to get pregnant. Talk to your health care professional if you are concerned about your fertility. This medicine has caused decreased sperm counts in some men. This may make it more difficult to father a child. Talk to your health care professional if you are concerned about your fertility. This medicine may increase your risk of getting an infection.  Call your health care professional for advice if you get a fever, chills, or sore throat, or other symptoms of a cold or flu. Do not treat yourself. Try to avoid being around people who are sick. Avoid taking medicines that contain aspirin, acetaminophen, ibuprofen, naproxen, or ketoprofen unless instructed by your health care professional. These medicines may hide a fever. Be careful brushing or flossing your teeth or using a toothpick because you may get an infection or bleed more easily. If you have any dental work done, tell your dentist you are receiving this medicine. What side effects may I notice from receiving this medicine? Side effects that you should report to your doctor or health care professional as soon as possible:  allergic reactions like skin rash, itching or hives, swelling of the face, lips, or tongue  breathing problems  cough  low blood counts - this medicine may decrease the number of white blood cells, red blood cells, and platelets. You may be at increased risk for infections and bleeding  nausea, vomiting  pain, redness, or irritation at site where injected  pain, tingling, numbness in the hands or feet  signs and symptoms of bleeding such as bloody or black, tarry stools; red or dark brown urine; spitting up blood or brown material that looks like coffee grounds; red spots on the skin; unusual bruising or bleeding from the eyes, gums, or nose  signs and symptoms of a dangerous change in heartbeat or heart rhythm like chest pain; dizziness; fast, irregular heartbeat; palpitations; feeling faint or lightheaded; falls  signs and symptoms of infection like fever; chills; cough; sore throat; pain or trouble passing urine  signs and symptoms of liver injury like dark yellow or brown urine; general ill feeling or flu-like symptoms; light-colored stools; loss of appetite; nausea; right upper belly pain; unusually weak or tired; yellowing of the eyes or skin  signs and  symptoms of low red blood cells or anemia such as unusually weak or tired; feeling faint or lightheaded; falls  signs and symptoms of muscle injury like dark urine; trouble passing urine or change in the amount of urine; unusually weak or tired; muscle pain; back pain Side effects that usually do not require medical attention (report to your doctor or health care professional if they continue or are bothersome):  changes in taste  diarrhea  gas  hair loss  loss of appetite  mouth sores This list may not describe all possible side effects. Call your doctor for medical advice about side effects. You may report side effects to FDA at 1-800-FDA-1088. Where should I keep my medicine? This drug is given in a hospital or clinic and will not be stored at home. NOTE: This sheet is a summary. It may not cover all possible information. If you have questions about this medicine, talk to your doctor, pharmacist, or health care provider.  2021 Elsevier/Gold Standard (2019-02-15 12:20:35)  Leucovorin injection  What is this medicine? LEUCOVORIN (loo koe VOR in) is used to prevent or treat the harmful effects of some medicines. This medicine is used to treat anemia caused by a low amount of folic acid in the body. It is also used with 5-fluorouracil (5-FU) to treat colon cancer. This medicine may be used for other purposes; ask your health care provider or pharmacist if you have questions. What should I tell my health care provider before I take this medicine? They need to know if you have any of these conditions:  anemia from low levels of vitamin B-12 in the blood  an unusual or allergic reaction to leucovorin, folic acid, other medicines, foods, dyes, or preservatives  pregnant or trying to get pregnant  breast-feeding How should I use this medicine? This medicine is for injection into a muscle or into a vein. It is given by a health care professional in a hospital or clinic setting. Talk to  your pediatrician regarding the use of this medicine in children. Special care may be needed. Overdosage: If you think you have taken too much of this medicine contact a poison control center or emergency room at once. NOTE: This medicine is only for you. Do not share this medicine with others. What if I miss a dose? This does not apply. What may interact with this medicine?  capecitabine  fluorouracil  phenobarbital  phenytoin  primidone  trimethoprim-sulfamethoxazole This list may not describe all possible interactions. Give your health care provider a list of all the medicines, herbs, non-prescription drugs, or dietary supplements you use. Also tell them if you smoke, drink alcohol, or use illegal drugs. Some items may interact with your medicine. What should I watch for while using this medicine? Your condition will be monitored carefully while you are receiving this medicine. This medicine may increase the side effects of 5-fluorouracil, 5-FU. Tell your doctor or health care professional if you have diarrhea or mouth sores that do not get better or that get worse. What side effects may I notice from receiving this medicine? Side effects that you should report to your doctor or health care professional as soon as possible:  allergic reactions like skin rash, itching or hives, swelling of the face, lips, or tongue  breathing problems  fever, infection  mouth sores  unusual bleeding or bruising  unusually weak or tired Side effects that usually do not require medical attention (report to your doctor or health care professional if they continue or are bothersome):  constipation or diarrhea  loss of appetite  nausea, vomiting This list may not describe all possible side effects. Call your doctor for medical advice about side effects. You may report side effects to FDA at 1-800-FDA-1088. Where should I keep my medicine? This drug is given in a hospital or clinic and will  not be stored at home. NOTE: This sheet is a summary. It may not cover all possible information. If you have questions about this medicine, talk to your doctor, pharmacist, or health care provider.  2021 Elsevier/Gold Standard (2008-04-03 16:50:29)  Fluorouracil, 5-FU injection What is this medicine? FLUOROURACIL, 5-FU (flure oh YOOR a sil) is a chemotherapy drug. It slows the growth of cancer cells. This medicine is used to treat many types of cancer like breast cancer, colon or rectal cancer, pancreatic cancer, and stomach cancer. This medicine may be used for other purposes; ask your health care provider or pharmacist if you have questions. COMMON BRAND NAME(S): Adrucil What should I tell my  health care provider before I take this medicine? They need to know if you have any of these conditions:  blood disorders  dihydropyrimidine dehydrogenase (DPD) deficiency  infection (especially a virus infection such as chickenpox, cold sores, or herpes)  kidney disease  liver disease  malnourished, poor nutrition  recent or ongoing radiation therapy  an unusual or allergic reaction to fluorouracil, other chemotherapy, other medicines, foods, dyes, or preservatives  pregnant or trying to get pregnant  breast-feeding How should I use this medicine? This drug is given as an infusion or injection into a vein. It is administered in a hospital or clinic by a specially trained health care professional. Talk to your pediatrician regarding the use of this medicine in children. Special care may be needed. Overdosage: If you think you have taken too much of this medicine contact a poison control center or emergency room at once. NOTE: This medicine is only for you. Do not share this medicine with others. What if I miss a dose? It is important not to miss your dose. Call your doctor or health care professional if you are unable to keep an appointment. What may interact with this medicine? Do not  take this medicine with any of the following medications:  live virus vaccines This medicine may also interact with the following medications:  medicines that treat or prevent blood clots like warfarin, enoxaparin, and dalteparin This list may not describe all possible interactions. Give your health care provider a list of all the medicines, herbs, non-prescription drugs, or dietary supplements you use. Also tell them if you smoke, drink alcohol, or use illegal drugs. Some items may interact with your medicine. What should I watch for while using this medicine? Visit your doctor for checks on your progress. This drug may make you feel generally unwell. This is not uncommon, as chemotherapy can affect healthy cells as well as cancer cells. Report any side effects. Continue your course of treatment even though you feel ill unless your doctor tells you to stop. In some cases, you may be given additional medicines to help with side effects. Follow all directions for their use. Call your doctor or health care professional for advice if you get a fever, chills or sore throat, or other symptoms of a cold or flu. Do not treat yourself. This drug decreases your body's ability to fight infections. Try to avoid being around people who are sick. This medicine may increase your risk to bruise or bleed. Call your doctor or health care professional if you notice any unusual bleeding. Be careful brushing and flossing your teeth or using a toothpick because you may get an infection or bleed more easily. If you have any dental work done, tell your dentist you are receiving this medicine. Avoid taking products that contain aspirin, acetaminophen, ibuprofen, naproxen, or ketoprofen unless instructed by your doctor. These medicines may hide a fever. Do not become pregnant while taking this medicine. Women should inform their doctor if they wish to become pregnant or think they might be pregnant. There is a potential for  serious side effects to an unborn child. Talk to your health care professional or pharmacist for more information. Do not breast-feed an infant while taking this medicine. Men should inform their doctor if they wish to father a child. This medicine may lower sperm counts. Do not treat diarrhea with over the counter products. Contact your doctor if you have diarrhea that lasts more than 2 days or if it is severe and  watery. This medicine can make you more sensitive to the sun. Keep out of the sun. If you cannot avoid being in the sun, wear protective clothing and use sunscreen. Do not use sun lamps or tanning beds/booths. What side effects may I notice from receiving this medicine? Side effects that you should report to your doctor or health care professional as soon as possible:  allergic reactions like skin rash, itching or hives, swelling of the face, lips, or tongue  low blood counts - this medicine may decrease the number of white blood cells, red blood cells and platelets. You may be at increased risk for infections and bleeding.  signs of infection - fever or chills, cough, sore throat, pain or difficulty passing urine  signs of decreased platelets or bleeding - bruising, pinpoint red spots on the skin, black, tarry stools, blood in the urine  signs of decreased red blood cells - unusually weak or tired, fainting spells, lightheadedness  breathing problems  changes in vision  chest pain  mouth sores  nausea and vomiting  pain, swelling, redness at site where injected  pain, tingling, numbness in the hands or feet  redness, swelling, or sores on hands or feet  stomach pain  unusual bleeding Side effects that usually do not require medical attention (report to your doctor or health care professional if they continue or are bothersome):  changes in finger or toe nails  diarrhea  dry or itchy skin  hair loss  headache  loss of appetite  sensitivity of eyes to the  light  stomach upset  unusually teary eyes This list may not describe all possible side effects. Call your doctor for medical advice about side effects. You may report side effects to FDA at 1-800-FDA-1088. Where should I keep my medicine? This drug is given in a hospital or clinic and will not be stored at home. NOTE: This sheet is a summary. It may not cover all possible information. If you have questions about this medicine, talk to your doctor, pharmacist, or health care provider.  2021 Elsevier/Gold Standard (2019-08-29 15:00:03)

## 2021-03-03 NOTE — Progress Notes (Signed)
Eric [name]   Diagnosis: Colon cancer  INTERVAL HISTORY:   Eric [name] completed another cycle of FOLFOX on 02/17/2021.  He had an episode of nausea and vomiting on 02/28/2021.  He relates this to eating chocolate ice cream.  No other nausea.  He had cold sensitivity following chemotherapy.  No neuropathy symptoms at present.  The right leg pain remains improved.  Objective:  Vital signs in last 24 hours:  Blood pressure 121/86, pulse 83, temperature 98.2 F (36.8 C), temperature source Oral, resp. rate 18, height $RemoveBe'6\' 3"'rAuzFAYFj$  (1.905 m), weight 151 lb 6.4 oz (68.7 kg), SpO2 100 %.    HEENT: No thrush or ulcers Resp: Lungs clear bilaterally Cardio: Regular rate and rhythm GI: No hepatomegaly, right lower quadrant colostomy Vascular: No leg edema Neuro: The vibratory sense is intact at the fingertips bilaterally Skin: Dryness of the palms  Portacath/PICC-without erythema  Lab Results:  Lab Results  Component Value Date   WBC 5.4 03/03/2021   HGB 10.6 (L) 03/03/2021   HCT 32.9 (L) 03/03/2021   MCV 81.4 03/03/2021   PLT 129 (L) 03/03/2021   NEUTROABS 2.2 03/03/2021    CMP  Lab Results  Component Value Date   NA 136 03/03/2021   K 3.4 (L) 03/03/2021   CL 101 03/03/2021   CO2 24 03/03/2021   GLUCOSE 126 (H) 03/03/2021   BUN 13 03/03/2021   CREATININE 1.06 03/03/2021   CALCIUM 9.2 03/03/2021   PROT 7.4 03/03/2021   ALBUMIN 4.0 03/03/2021   AST 24 03/03/2021   ALT 18 03/03/2021   ALKPHOS 85 03/03/2021   BILITOT 0.5 03/03/2021   GFRNONAA >60 03/03/2021    Lab Results  Component Value Date   CEA1 0.8 11/19/2020     Medications: I have reviewed the patient's current medications.   Assessment/Plan: 1. Stage IV adenocarcinoma of the colon -11/15/2020 CT abdomen/pelvis with contrast-long segment masslike mural thickening of the cecum, posterior perforation at the site of the cecal wall thickening with multilocular right retroperitoneal  abscess, indeterminate hypodensity in the inferior right lobe of the liver. -11/19/2020 CEA 0.8 -11/20/2020 MRI of the abdomen and pelvis with and without contrast-3.5 x 2.5 cm heterogeneous enhancing lesion in the inferior peripheral right liver with 2 smaller lesions with similar features. -11/20/2020 ultrasound-guided liver biopsy consistent with adenocarcinoma.  Foundation 1-MSS, tumor mutation burden 8,KRAS wt -11/25/2020 CT abdomen/pelvis with contrast-interval placement of a right lower quadrant percutaneous pigtail drainage catheter with near resolution of the retroperitoneal fluid collection, redemonstrated large fungating mass at the cecal base measuring 11.9 x 9.1 x 8.4 cm with enlarged lymph nodes in the right lower quadrant mesocolon. -11/28/2020 exploratory laparotomy, right colectomy, ileostomy creation, cystoscopy with stent placement -Surgical pathology showed adenocarcinoma, moderately differentiated, 12 cm, 0/14+ lymph nodes, lymphovascular space invasion present, radial margin involved by invasive adenocarcinoma, appendix with intraluminal adenocarcinoma, pT4, PN 0 -12/09/2020 CT abdomen/pelvis with contrast-interval right colectomy and right lower quadrant ileostomy, stable right retroperitoneal soft tissue density along the psoas and iliacusmuscles, mild progression of liver metastases since prior study. -Cycle 1 FOLFOX 12/24/2020 -Cycle 2 FOLFOX 01/06/2021 -Cycle 3 FOLFOX 01/20/2021 -Cycle 4 FOLFOX 02/03/2021 -Cycle 5 FOLFOX 02/17/2021 -Cycle 6 FOLFOX 03/03/2021 2. Sepsis secondary to perforated colon 3. Retroperitoneal abscess 4. Protein calorie malnutrition 5. Thrombocytosis 6. Iron deficiency anemia 7. COVID-19 infection, 11/15/2020 8. Tobacco abuse  9.Family history of colon cancer 10.  Port-A-Cath placement, Dr. [name] 12/12/2020    Disposition: Eric [name] appears stable.  He has completed 5 cycles of FOLFOX.  He is tolerated the chemotherapy well.   His overall clinical status has improved.  He was scheduled to undergo restaging CTs on 02/28/2021, but he had nausea/vomiting that day.  The etiology of the nausea is unclear, but he relates this to eating chocolate ice cream.  He will complete another cycle of FOLFOX today.  He will then undergo a restaging CT later today.  He will return for an office visit to review the CT images on 03/05/2021.  [name], MD  03/03/2021  3:49 PM

## 2021-03-05 ENCOUNTER — Other Ambulatory Visit: Payer: Self-pay

## 2021-03-05 ENCOUNTER — Encounter (HOSPITAL_BASED_OUTPATIENT_CLINIC_OR_DEPARTMENT_OTHER): Payer: Self-pay

## 2021-03-05 ENCOUNTER — Inpatient Hospital Stay: Payer: Self-pay

## 2021-03-05 ENCOUNTER — Ambulatory Visit (HOSPITAL_BASED_OUTPATIENT_CLINIC_OR_DEPARTMENT_OTHER)
Admission: RE | Admit: 2021-03-05 | Discharge: 2021-03-05 | Disposition: A | Discharge status: Home / Self Care | Payer: Self-pay | Source: Ambulatory Visit | Attending: Oncology | Admitting: Oncology

## 2021-03-05 ENCOUNTER — Inpatient Hospital Stay: Payer: Self-pay | Admitting: Oncology

## 2021-03-05 VITALS — BP 101/74 | HR 92 | Temp 98.3°F | Resp 18

## 2021-03-05 DIAGNOSIS — K631 Perforation of intestine (nontraumatic): Secondary | ICD-10-CM

## 2021-03-05 DIAGNOSIS — C189 Malignant neoplasm of colon, unspecified: Secondary | ICD-10-CM | POA: Insufficient documentation

## 2021-03-05 DIAGNOSIS — C787 Secondary malignant neoplasm of liver and intrahepatic bile duct: Secondary | ICD-10-CM | POA: Insufficient documentation

## 2021-03-05 MED ORDER — SODIUM CHLORIDE 0.9% FLUSH
10.0000 mL | INTRAVENOUS | Status: DC | PRN
  Administered 2021-03-05: 10 mL
  Filled 2021-03-05: qty 10

## 2021-03-05 MED ORDER — HEPARIN SOD (PORK) LOCK FLUSH 100 UNIT/ML IV SOLN
500.0000 [IU] | Freq: Once | INTRAVENOUS | Status: AC | PRN
Start: 2021-03-05 — End: 2021-03-05
  Administered 2021-03-05: 500 [IU]
  Filled 2021-03-05: qty 5

## 2021-03-05 MED ORDER — IOHEXOL 300 MG/ML  SOLN
75.0000 mL | Freq: Once | INTRAMUSCULAR | Status: AC | PRN
  Administered 2021-03-05: 75 mL via INTRAVENOUS

## 2021-03-10 ENCOUNTER — Other Ambulatory Visit: Payer: Self-pay | Admitting: Oncology

## 2021-03-13 ENCOUNTER — Telehealth: Payer: Self-pay

## 2021-03-13 NOTE — Telephone Encounter (Signed)
TC to Pt informed Pt per Dr. Benay Spice  that liver lesions are smaller and the plan is to continue the folfox and fu as scheduled. Pt verbalized understanding. No further problems or concerns noted.

## 2021-03-13 NOTE — Telephone Encounter (Signed)
-----   Message from Ladell Pier, MD sent at 03/10/2021  8:14 AM EDT ----- Please call patient, liver lesions are smaller, plan to continue folfox, f/u as scheduled

## 2021-03-17 ENCOUNTER — Inpatient Hospital Stay: Payer: Self-pay

## 2021-03-17 ENCOUNTER — Telehealth: Payer: Self-pay | Admitting: *Deleted

## 2021-03-17 ENCOUNTER — Inpatient Hospital Stay: Payer: Self-pay | Admitting: Nurse Practitioner

## 2021-03-17 NOTE — Telephone Encounter (Signed)
Sister called to cancel his appointments today due to patient vomiting and not feeling well. Called Mr. Carrera and he reports the following symptoms: 1. N/V--5/31 and 6/3--vomited x 1, vomited x 2 6/5 and X 1 today so far. Not large amounts, maybe a cup or less. He reports he is taking his ondansetron q 8 hours and prochlorperazine every 6 hours.  2. Mild headache 3. Feels tired/weak, but able to ambulate and care for himself. Denies constipation--ileostomy output normal, no fever or chills, no reflux or heartburn, still has sense of taste and smell. States he ate well yesterday--"I ate a cheeseburger". He thinks he is drinking well. This RN instructed him to push fluids and eat light diet until N/V resolves. He denies any known COVID exposure saying he only goes out for MD appointments. Asking if his appointments can be rescheduled for Tuesday or Wednesday?

## 2021-03-17 NOTE — Telephone Encounter (Signed)
NP aware of symptoms. Will reschedule for 6/8--scheduling message sent

## 2021-03-18 ENCOUNTER — Other Ambulatory Visit: Payer: Self-pay | Admitting: Oncology

## 2021-03-19 ENCOUNTER — Encounter: Payer: Self-pay | Admitting: Oncology

## 2021-03-19 ENCOUNTER — Inpatient Hospital Stay: Payer: Self-pay

## 2021-03-19 ENCOUNTER — Other Ambulatory Visit (HOSPITAL_BASED_OUTPATIENT_CLINIC_OR_DEPARTMENT_OTHER): Payer: Self-pay

## 2021-03-19 ENCOUNTER — Encounter: Payer: Self-pay | Admitting: Nurse Practitioner

## 2021-03-19 ENCOUNTER — Inpatient Hospital Stay: Payer: Self-pay | Attending: Oncology

## 2021-03-19 ENCOUNTER — Inpatient Hospital Stay (HOSPITAL_BASED_OUTPATIENT_CLINIC_OR_DEPARTMENT_OTHER): Payer: Self-pay | Admitting: Nurse Practitioner

## 2021-03-19 ENCOUNTER — Other Ambulatory Visit: Payer: Self-pay

## 2021-03-19 VITALS — BP 120/84 | HR 98 | Temp 98.2°F | Resp 18 | Ht 75.0 in | Wt 144.0 lb

## 2021-03-19 DIAGNOSIS — K631 Perforation of intestine (nontraumatic): Secondary | ICD-10-CM

## 2021-03-19 DIAGNOSIS — Z5111 Encounter for antineoplastic chemotherapy: Secondary | ICD-10-CM | POA: Insufficient documentation

## 2021-03-19 DIAGNOSIS — C189 Malignant neoplasm of colon, unspecified: Secondary | ICD-10-CM

## 2021-03-19 DIAGNOSIS — Z79899 Other long term (current) drug therapy: Secondary | ICD-10-CM | POA: Insufficient documentation

## 2021-03-19 DIAGNOSIS — C787 Secondary malignant neoplasm of liver and intrahepatic bile duct: Secondary | ICD-10-CM

## 2021-03-19 DIAGNOSIS — C180 Malignant neoplasm of cecum: Secondary | ICD-10-CM | POA: Insufficient documentation

## 2021-03-19 LAB — CBC WITH DIFFERENTIAL (CANCER CENTER ONLY)
Abs Immature Granulocytes: 0.01 10*3/uL (ref 0.00–0.07)
Basophils Absolute: 0.1 10*3/uL (ref 0.0–0.1)
Basophils Relative: 2 %
Eosinophils Absolute: 0.2 10*3/uL (ref 0.0–0.5)
Eosinophils Relative: 4 %
HCT: 34.3 % — ABNORMAL LOW (ref 39.0–52.0)
Hemoglobin: 11.1 g/dL — ABNORMAL LOW (ref 13.0–17.0)
Immature Granulocytes: 0 %
Lymphocytes Relative: 37 %
Lymphs Abs: 1.6 10*3/uL (ref 0.7–4.0)
MCH: 26.1 pg (ref 26.0–34.0)
MCHC: 32.4 g/dL (ref 30.0–36.0)
MCV: 80.5 fL (ref 80.0–100.0)
Monocytes Absolute: 0.8 10*3/uL (ref 0.1–1.0)
Monocytes Relative: 19 %
Neutro Abs: 1.7 10*3/uL (ref 1.7–7.7)
Neutrophils Relative %: 38 %
Platelet Count: 155 10*3/uL (ref 150–400)
RBC: 4.26 MIL/uL (ref 4.22–5.81)
RDW: 18.5 % — ABNORMAL HIGH (ref 11.5–15.5)
WBC Count: 4.4 10*3/uL (ref 4.0–10.5)
nRBC: 0 % (ref 0.0–0.2)

## 2021-03-19 LAB — CMP (CANCER CENTER ONLY)
ALT: 17 U/L (ref 0–44)
AST: 25 U/L (ref 15–41)
Albumin: 4.1 g/dL (ref 3.5–5.0)
Alkaline Phosphatase: 143 U/L — ABNORMAL HIGH (ref 38–126)
Anion gap: 9 (ref 5–15)
BUN: 26 mg/dL — ABNORMAL HIGH (ref 6–20)
CO2: 23 mmol/L (ref 22–32)
Calcium: 9.1 mg/dL (ref 8.9–10.3)
Chloride: 99 mmol/L (ref 98–111)
Creatinine: 1.26 mg/dL — ABNORMAL HIGH (ref 0.61–1.24)
GFR, Estimated: >60 mL/min (ref >60)
Glucose, Bld: 144 mg/dL — ABNORMAL HIGH (ref 70–99)
Potassium: 4 mmol/L (ref 3.5–5.1)
Sodium: 131 mmol/L — ABNORMAL LOW (ref 135–145)
Total Bilirubin: 0.6 mg/dL (ref 0.3–1.2)
Total Protein: 7.3 g/dL (ref 6.5–8.1)

## 2021-03-19 MED ORDER — PALONOSETRON HCL INJECTION 0.25 MG/5ML
0.2500 mg | Freq: Once | INTRAVENOUS | Status: AC
  Administered 2021-03-19: 0.25 mg via INTRAVENOUS
  Filled 2021-03-19: qty 5

## 2021-03-19 MED ORDER — PANTOPRAZOLE SODIUM 40 MG PO TBEC
40.0000 mg | DELAYED_RELEASE_TABLET | Freq: Every day | ORAL | 2 refills | Status: DC
  Filled 2021-03-19: qty 30, 30d supply, fill #0

## 2021-03-19 MED ORDER — HEPARIN SOD (PORK) LOCK FLUSH 100 UNIT/ML IV SOLN
500.0000 [IU] | Freq: Once | INTRAVENOUS | Status: DC | PRN
  Filled 2021-03-19: qty 5

## 2021-03-19 MED ORDER — OXALIPLATIN CHEMO INJECTION 100 MG/20ML
85.0000 mg/m2 | Freq: Once | INTRAVENOUS | Status: AC
  Administered 2021-03-19: 165 mg via INTRAVENOUS
  Filled 2021-03-19: qty 33

## 2021-03-19 MED ORDER — LEUCOVORIN CALCIUM INJECTION 350 MG
400.0000 mg/m2 | Freq: Once | INTRAVENOUS | Status: AC
  Administered 2021-03-19: 768 mg via INTRAVENOUS
  Filled 2021-03-19: qty 38.4

## 2021-03-19 MED ORDER — SODIUM CHLORIDE 0.9% FLUSH
10.0000 mL | INTRAVENOUS | Status: DC | PRN
Start: 2021-03-19 — End: 2021-03-19
  Administered 2021-03-19: 10 mL
  Filled 2021-03-19: qty 10

## 2021-03-19 MED ORDER — DEXTROSE 5 % IV SOLN
Freq: Once | INTRAVENOUS | Status: AC
  Filled 2021-03-19: qty 250

## 2021-03-19 MED ORDER — OXYCODONE HCL 10 MG PO TABS
5.0000 mg | ORAL_TABLET | Freq: Four times a day (QID) | ORAL | 0 refills | Status: DC | PRN
  Filled 2021-03-19: qty 25, 7d supply, fill #0

## 2021-03-19 MED ORDER — FLUOROURACIL CHEMO INJECTION 2.5 GM/50ML
400.0000 mg/m2 | Freq: Once | INTRAVENOUS | Status: AC
  Administered 2021-03-19: 750 mg via INTRAVENOUS
  Filled 2021-03-19: qty 15

## 2021-03-19 MED ORDER — SODIUM CHLORIDE 0.9 % IV SOLN
2400.0000 mg/m2 | INTRAVENOUS | Status: DC
  Administered 2021-03-19: 4600 mg via INTRAVENOUS
  Filled 2021-03-19: qty 92

## 2021-03-19 MED ORDER — SODIUM CHLORIDE 0.9 % IV SOLN
10.0000 mg | Freq: Once | INTRAVENOUS | Status: AC
  Administered 2021-03-19: 10 mg via INTRAVENOUS
  Filled 2021-03-19: qty 1

## 2021-03-19 MED ORDER — SODIUM CHLORIDE 0.9 % IV SOLN
5000.0000 mg | INTRAVENOUS | Status: DC
  Filled 2021-03-19: qty 100

## 2021-03-19 NOTE — Patient Instructions (Signed)

## 2021-03-19 NOTE — Addendum Note (Signed)
Addended by: Owens Shark on: 03/19/2021 01:09 PM   Modules accepted: Orders

## 2021-03-19 NOTE — Progress Notes (Signed)
[name] OFFICE PROGRESS NOTE   Diagnosis: Colon cancer  INTERVAL HISTORY:   [facility] [name] returns for follow-up.  He completed cycle 6 FOLFOX 03/03/2021.  He was scheduled for follow-up and treatment on 03/17/2021.  His sister contacted the office that day to cancel his appointments due to vomiting and in general not feeling well.  He is feeling better.  He had no significant nausea/vomiting after the chemotherapy.  He has periodic nausea/vomiting which he relates to eating "too much, too fast".  No diarrhea.  No mouth sores.  Cold sensitivity tends to last about 3 days.  No persistent neuropathy symptoms.  Right leg pain continues to be improved.  Objective:  Vital signs in last 24 hours:  Blood pressure 120/84, pulse 98, temperature 98.2 F (36.8 C), temperature source Oral, resp. rate 18, height $RemoveBe'6\' 3"'jDYFsTPAh$  (1.905 m), weight 144 lb (65.3 kg), SpO2 100 %.    HEENT: No thrush or ulcers. Resp: Lungs clear bilaterally. Cardio: Regular rate and rhythm. GI: Abdomen soft and nontender.  No hepatomegaly.  Right lower quadrant colostomy. Vascular: No leg edema. Neuro: Vibratory sense mildly decreased over the fingertips per tuning fork exam. Skin: Palms with skin thickening, mild dryness. Port-A-Cath without erythema.   Lab Results:  Lab Results  Component Value Date   WBC 4.4 03/19/2021   HGB 11.1 (L) 03/19/2021   HCT 34.3 (L) 03/19/2021   MCV 80.5 03/19/2021   PLT 155 03/19/2021   NEUTROABS 1.7 03/19/2021    Imaging:  No results found.  Medications: I have reviewed the patient's current medications.  Assessment/Plan: 1. Stage IV adenocarcinoma of the colon -11/15/2020 CT abdomen/pelvis with contrast-long segment masslike mural thickening of the cecum, posterior perforation at the site of the cecal wall thickening with multilocular right retroperitoneal abscess, indeterminate hypodensity in the inferior right lobe of the liver. -11/19/2020 CEA 0.8 -11/20/2020 MRI of the  abdomen and pelvis with and without contrast-3.5 x 2.5 cm heterogeneous enhancing lesion in the inferior peripheral right liver with 2 smaller lesions with similar features. -11/20/2020 ultrasound-guided liver biopsy consistent with adenocarcinoma.Foundation 1-MSS, tumor mutation burden 8,KRAS wt -11/25/2020 CT abdomen/pelvis with contrast-interval placement of a right lower quadrant percutaneous pigtail drainage catheter with near resolution of the retroperitoneal fluid collection, redemonstrated large fungating mass at the cecal base measuring 11.9 x 9.1 x 8.4 cm with enlarged lymph nodes in the right lower quadrant mesocolon. -11/28/2020 exploratory laparotomy, right colectomy, ileostomy creation, cystoscopy with stent placement -Surgical pathology showed adenocarcinoma, moderately differentiated, 12 cm, 0/14+ lymph nodes, lymphovascular space invasion present, radial margin involved by invasive adenocarcinoma, appendix with intraluminal adenocarcinoma, pT4, PN 0 -12/09/2020 CT abdomen/pelvis with contrast-interval right colectomy and right lower quadrant ileostomy, stable right retroperitoneal soft tissue density along the psoas and iliacusmuscles, mild progression of liver metastases since prior study. -Cycle 1 FOLFOX 12/24/2020 -Cycle 2 FOLFOX 01/06/2021 -Cycle 3 FOLFOX 01/20/2021 -Cycle 4 FOLFOX 02/03/2021 -Cycle 5 FOLFOX 02/17/2021 -Cycle 6 FOLFOX 03/03/2021 -Restaging CTs 03/05/2021- substantial interval decrease in size of predominantly hypodense lesions throughout the liver.  Significant interval resolution of previously seen extensive, heterogeneous soft tissue in the right retroperitoneal soft tissues and paracolic gutter overlying the psoas and iliacus muscles. -Cycle 7 FOLFOX 03/19/2021 2. Sepsis secondary to perforated colon 3. Retroperitoneal abscess 4. Protein calorie malnutrition 5. Thrombocytosis 6. Iron deficiency anemia 7. COVID-19 infection, 11/15/2020 8.  Tobacco abuse  9.Family history of colon cancer 10. Port-A-Cath placement, Eric [name] 12/12/2020  Disposition: Eric [name] appears stable.  He has  completed 6 cycles of FOLFOX.  Restaging CTs show improvement.  Results were reviewed with Eric [name] and his sister at today's appointment.  He understands the recommendation is to continue FOLFOX chemotherapy.  He agrees with this plan.  We reviewed the CBC from today.  Counts adequate to proceed with treatment.  He will return for lab, follow-up, cycle 7 FOLFOX in 2 weeks.  He will contact the office in the interim with any problems.    Ned Card ANP/GNP-BC   03/19/2021  8:42 AM

## 2021-03-19 NOTE — Patient Instructions (Signed)
Eric [name]  Discharge Instructions: Thank you for choosing [facility] to provide your oncology and hematology care.   If you have a lab appointment with the [facility], please go directly to the [facility] and check in at the registration area.   Wear comfortable clothing and clothing appropriate for easy access to any Portacath or PICC line.   We strive to give you quality time with your provider. You may need to reschedule your appointment if you arrive late (15 or more minutes).  Arriving late affects you and other patients whose appointments are after yours.  Also, if you miss three or more appointments without notifying the office, you may be dismissed from the clinic at the provider's discretion.      For prescription refill requests, have your pharmacy contact our office and allow 72 hours for refills to be completed.    Today you received the following chemotherapy and/or immunotherapy agents Oxaliplatin, leucovorin, fluorouracil     To help prevent nausea and vomiting after your treatment, we encourage you to take your nausea medication as directed.  BELOW ARE SYMPTOMS THAT SHOULD BE REPORTED IMMEDIATELY: . *FEVER GREATER THAN 100.4 F (38 C) OR HIGHER . *CHILLS OR SWEATING . *NAUSEA AND VOMITING THAT IS NOT CONTROLLED WITH YOUR NAUSEA MEDICATION . *UNUSUAL SHORTNESS OF BREATH . *UNUSUAL BRUISING OR BLEEDING . *URINARY PROBLEMS (pain or burning when urinating, or frequent urination) . *BOWEL PROBLEMS (unusual diarrhea, constipation, pain near the anus) . TENDERNESS IN MOUTH AND THROAT WITH OR WITHOUT PRESENCE OF ULCERS (sore throat, sores in mouth, or a toothache) . UNUSUAL RASH, SWELLING OR PAIN  . UNUSUAL VAGINAL DISCHARGE OR ITCHING   Items with * indicate a potential emergency and should be followed up as soon as possible or go to the Emergency Department if any problems should occur.  Please show the CHEMOTHERAPY ALERT CARD  or IMMUNOTHERAPY ALERT CARD at check-in to the Emergency Department and triage nurse.  Should you have questions after your visit or need to cancel or reschedule your appointment, please contact [facility]  Dept: [phone]  and follow the prompts.  Office hours are 8:00 a.m. to 4:30 p.m. Monday - Friday. Please note that voicemails left after 4:00 p.m. may not be returned until the following business day.  We are closed weekends and major holidays. You have access to a nurse at all times for urgent questions. Please call the main number to the clinic Dept: [phone] and follow the prompts.   For any non-urgent questions, you may also contact your provider using MyChart. We now offer e-Visits for anyone 53 and older to request care online for non-urgent symptoms. For details visit [email or web address].   Also download the MyChart app! Go to the app store, search "MyChart", open the app, select [facility], and log in with your MyChart username and password.  Due to Covid, a mask is required upon entering the hospital/clinic. If you do not have a mask, one will be given to you upon arrival. For doctor visits, patients may have 1 support person aged 25 or older with them. For treatment visits, patients cannot have anyone with them due to current Covid guidelines and our immunocompromised population.   Oxaliplatin Injection What is this medicine? OXALIPLATIN (ox AL i PLA tin) is a chemotherapy drug. It targets fast dividing cells, like cancer cells, and causes these cells to die. This medicine is used to treat cancers of  the colon and rectum, and many other cancers. This medicine may be used for other purposes; ask your health care provider or pharmacist if you have questions. COMMON BRAND NAME(S): Eloxatin What should I tell my health care provider before I take this medicine? They need to know if you have any of these conditions:  heart disease  history of  irregular heartbeat  liver disease  low blood counts, like white cells, platelets, or red blood cells  lung or breathing disease, like asthma  take medicines that treat or prevent blood clots  tingling of the fingers or toes, or other nerve disorder  an unusual or allergic reaction to oxaliplatin, other chemotherapy, other medicines, foods, dyes, or preservatives  pregnant or trying to get pregnant  breast-feeding How should I use this medicine? This drug is given as an infusion into a vein. It is administered in a hospital or clinic by a specially trained health care professional. Talk to your pediatrician regarding the use of this medicine in children. Special care may be needed. Overdosage: If you think you have taken too much of this medicine contact a poison control center or emergency room at once. NOTE: This medicine is only for you. Do not share this medicine with others. What if I miss a dose? It is important not to miss a dose. Call your doctor or health care professional if you are unable to keep an appointment. What may interact with this medicine? Do not take this medicine with any of the following medications:  cisapride  dronedarone  pimozide  thioridazine This medicine may also interact with the following medications:  aspirin and aspirin-like medicines  certain medicines that treat or prevent blood clots like warfarin, apixaban, dabigatran, and rivaroxaban  cisplatin  cyclosporine  diuretics  medicines for infection like acyclovir, adefovir, amphotericin B, bacitracin, cidofovir, foscarnet, ganciclovir, gentamicin, pentamidine, vancomycin  NSAIDs, medicines for pain and inflammation, like ibuprofen or naproxen  other medicines that prolong the QT interval (an abnormal heart rhythm)  pamidronate  zoledronic acid This list may not describe all possible interactions. Give your health care provider a list of all the medicines, herbs,  non-prescription drugs, or dietary supplements you use. Also tell them if you smoke, drink alcohol, or use illegal drugs. Some items may interact with your medicine. What should I watch for while using this medicine? Your condition will be monitored carefully while you are receiving this medicine. You may need blood work done while you are taking this medicine. This medicine may make you feel generally unwell. This is not uncommon as chemotherapy can affect healthy cells as well as cancer cells. Report any side effects. Continue your course of treatment even though you feel ill unless your healthcare professional tells you to stop. This medicine can make you more sensitive to cold. Do not drink cold drinks or use ice. Cover exposed skin before coming in contact with cold temperatures or cold objects. When out in cold weather wear warm clothing and cover your mouth and nose to warm the air that goes into your lungs. Tell your doctor if you get sensitive to the cold. Do not become pregnant while taking this medicine or for 9 months after stopping it. Women should inform their health care professional if they wish to become pregnant or think they might be pregnant. Men should not father a child while taking this medicine and for 6 months after stopping it. There is potential for serious side effects to an unborn child. Talk to  your health care professional for more information. Do not breast-feed a child while taking this medicine or for 3 months after stopping it. This medicine has caused ovarian failure in some women. This medicine may make it more difficult to get pregnant. Talk to your health care professional if you are concerned about your fertility. This medicine has caused decreased sperm counts in some men. This may make it more difficult to father a child. Talk to your health care professional if you are concerned about your fertility. This medicine may increase your risk of getting an infection.  Call your health care professional for advice if you get a fever, chills, or sore throat, or other symptoms of a cold or flu. Do not treat yourself. Try to avoid being around people who are sick. Avoid taking medicines that contain aspirin, acetaminophen, ibuprofen, naproxen, or ketoprofen unless instructed by your health care professional. These medicines may hide a fever. Be careful brushing or flossing your teeth or using a toothpick because you may get an infection or bleed more easily. If you have any dental work done, tell your dentist you are receiving this medicine. What side effects may I notice from receiving this medicine? Side effects that you should report to your doctor or health care professional as soon as possible:  allergic reactions like skin rash, itching or hives, swelling of the face, lips, or tongue  breathing problems  cough  low blood counts - this medicine may decrease the number of white blood cells, red blood cells, and platelets. You may be at increased risk for infections and bleeding  nausea, vomiting  pain, redness, or irritation at site where injected  pain, tingling, numbness in the hands or feet  signs and symptoms of bleeding such as bloody or black, tarry stools; red or dark brown urine; spitting up blood or brown material that looks like coffee grounds; red spots on the skin; unusual bruising or bleeding from the eyes, gums, or nose  signs and symptoms of a dangerous change in heartbeat or heart rhythm like chest pain; dizziness; fast, irregular heartbeat; palpitations; feeling faint or lightheaded; falls  signs and symptoms of infection like fever; chills; cough; sore throat; pain or trouble passing urine  signs and symptoms of liver injury like dark yellow or brown urine; general ill feeling or flu-like symptoms; light-colored stools; loss of appetite; nausea; right upper belly pain; unusually weak or tired; yellowing of the eyes or skin  signs and  symptoms of low red blood cells or anemia such as unusually weak or tired; feeling faint or lightheaded; falls  signs and symptoms of muscle injury like dark urine; trouble passing urine or change in the amount of urine; unusually weak or tired; muscle pain; back pain Side effects that usually do not require medical attention (report to your doctor or health care professional if they continue or are bothersome):  changes in taste  diarrhea  gas  hair loss  loss of appetite  mouth sores This list may not describe all possible side effects. Call your doctor for medical advice about side effects. You may report side effects to FDA at 1-800-FDA-1088. Where should I keep my medicine? This drug is given in a hospital or clinic and will not be stored at home. NOTE: This sheet is a summary. It may not cover all possible information. If you have questions about this medicine, talk to your doctor, pharmacist, or health care provider.  2021 Elsevier/Gold Standard (2019-02-15 12:20:35)  Leucovorin injection  What is this medicine? LEUCOVORIN (loo koe VOR in) is used to prevent or treat the harmful effects of some medicines. This medicine is used to treat anemia caused by a low amount of folic acid in the body. It is also used with 5-fluorouracil (5-FU) to treat colon cancer. This medicine may be used for other purposes; ask your health care provider or pharmacist if you have questions. What should I tell my health care provider before I take this medicine? They need to know if you have any of these conditions:  anemia from low levels of vitamin B-12 in the blood  an unusual or allergic reaction to leucovorin, folic acid, other medicines, foods, dyes, or preservatives  pregnant or trying to get pregnant  breast-feeding How should I use this medicine? This medicine is for injection into a muscle or into a vein. It is given by a health care professional in a hospital or clinic setting. Talk to  your pediatrician regarding the use of this medicine in children. Special care may be needed. Overdosage: If you think you have taken too much of this medicine contact a poison control center or emergency room at once. NOTE: This medicine is only for you. Do not share this medicine with others. What if I miss a dose? This does not apply. What may interact with this medicine?  capecitabine  fluorouracil  phenobarbital  phenytoin  primidone  trimethoprim-sulfamethoxazole This list may not describe all possible interactions. Give your health care provider a list of all the medicines, herbs, non-prescription drugs, or dietary supplements you use. Also tell them if you smoke, drink alcohol, or use illegal drugs. Some items may interact with your medicine. What should I watch for while using this medicine? Your condition will be monitored carefully while you are receiving this medicine. This medicine may increase the side effects of 5-fluorouracil, 5-FU. Tell your doctor or health care professional if you have diarrhea or mouth sores that do not get better or that get worse. What side effects may I notice from receiving this medicine? Side effects that you should report to your doctor or health care professional as soon as possible:  allergic reactions like skin rash, itching or hives, swelling of the face, lips, or tongue  breathing problems  fever, infection  mouth sores  unusual bleeding or bruising  unusually weak or tired Side effects that usually do not require medical attention (report to your doctor or health care professional if they continue or are bothersome):  constipation or diarrhea  loss of appetite  nausea, vomiting This list may not describe all possible side effects. Call your doctor for medical advice about side effects. You may report side effects to FDA at 1-800-FDA-1088. Where should I keep my medicine? This drug is given in a hospital or clinic and will  not be stored at home. NOTE: This sheet is a summary. It may not cover all possible information. If you have questions about this medicine, talk to your doctor, pharmacist, or health care provider.  2021 Elsevier/Gold Standard (2008-04-03 16:50:29)  Fluorouracil, 5-FU injection What is this medicine? FLUOROURACIL, 5-FU (flure oh YOOR a sil) is a chemotherapy drug. It slows the growth of cancer cells. This medicine is used to treat many types of cancer like breast cancer, colon or rectal cancer, pancreatic cancer, and stomach cancer. This medicine may be used for other purposes; ask your health care provider or pharmacist if you have questions. COMMON BRAND NAME(S): Adrucil What should I tell my  health care provider before I take this medicine? They need to know if you have any of these conditions:  blood disorders  dihydropyrimidine dehydrogenase (DPD) deficiency  infection (especially a virus infection such as chickenpox, cold sores, or herpes)  kidney disease  liver disease  malnourished, poor nutrition  recent or ongoing radiation therapy  an unusual or allergic reaction to fluorouracil, other chemotherapy, other medicines, foods, dyes, or preservatives  pregnant or trying to get pregnant  breast-feeding How should I use this medicine? This drug is given as an infusion or injection into a vein. It is administered in a hospital or clinic by a specially trained health care professional. Talk to your pediatrician regarding the use of this medicine in children. Special care may be needed. Overdosage: If you think you have taken too much of this medicine contact a poison control center or emergency room at once. NOTE: This medicine is only for you. Do not share this medicine with others. What if I miss a dose? It is important not to miss your dose. Call your doctor or health care professional if you are unable to keep an appointment. What may interact with this medicine? Do not  take this medicine with any of the following medications:  live virus vaccines This medicine may also interact with the following medications:  medicines that treat or prevent blood clots like warfarin, enoxaparin, and dalteparin This list may not describe all possible interactions. Give your health care provider a list of all the medicines, herbs, non-prescription drugs, or dietary supplements you use. Also tell them if you smoke, drink alcohol, or use illegal drugs. Some items may interact with your medicine. What should I watch for while using this medicine? Visit your doctor for checks on your progress. This drug may make you feel generally unwell. This is not uncommon, as chemotherapy can affect healthy cells as well as cancer cells. Report any side effects. Continue your course of treatment even though you feel ill unless your doctor tells you to stop. In some cases, you may be given additional medicines to help with side effects. Follow all directions for their use. Call your doctor or health care professional for advice if you get a fever, chills or sore throat, or other symptoms of a cold or flu. Do not treat yourself. This drug decreases your body's ability to fight infections. Try to avoid being around people who are sick. This medicine may increase your risk to bruise or bleed. Call your doctor or health care professional if you notice any unusual bleeding. Be careful brushing and flossing your teeth or using a toothpick because you may get an infection or bleed more easily. If you have any dental work done, tell your dentist you are receiving this medicine. Avoid taking products that contain aspirin, acetaminophen, ibuprofen, naproxen, or ketoprofen unless instructed by your doctor. These medicines may hide a fever. Do not become pregnant while taking this medicine. Women should inform their doctor if they wish to become pregnant or think they might be pregnant. There is a potential for  serious side effects to an unborn child. Talk to your health care professional or pharmacist for more information. Do not breast-feed an infant while taking this medicine. Men should inform their doctor if they wish to father a child. This medicine may lower sperm counts. Do not treat diarrhea with over the counter products. Contact your doctor if you have diarrhea that lasts more than 2 days or if it is severe and  watery. This medicine can make you more sensitive to the sun. Keep out of the sun. If you cannot avoid being in the sun, wear protective clothing and use sunscreen. Do not use sun lamps or tanning beds/booths. What side effects may I notice from receiving this medicine? Side effects that you should report to your doctor or health care professional as soon as possible:  allergic reactions like skin rash, itching or hives, swelling of the face, lips, or tongue  low blood counts - this medicine may decrease the number of white blood cells, red blood cells and platelets. You may be at increased risk for infections and bleeding.  signs of infection - fever or chills, cough, sore throat, pain or difficulty passing urine  signs of decreased platelets or bleeding - bruising, pinpoint red spots on the skin, black, tarry stools, blood in the urine  signs of decreased red blood cells - unusually weak or tired, fainting spells, lightheadedness  breathing problems  changes in vision  chest pain  mouth sores  nausea and vomiting  pain, swelling, redness at site where injected  pain, tingling, numbness in the hands or feet  redness, swelling, or sores on hands or feet  stomach pain  unusual bleeding Side effects that usually do not require medical attention (report to your doctor or health care professional if they continue or are bothersome):  changes in finger or toe nails  diarrhea  dry or itchy skin  hair loss  headache  loss of appetite  sensitivity of eyes to the  light  stomach upset  unusually teary eyes This list may not describe all possible side effects. Call your doctor for medical advice about side effects. You may report side effects to FDA at 1-800-FDA-1088. Where should I keep my medicine? This drug is given in a hospital or clinic and will not be stored at home. NOTE: This sheet is a summary. It may not cover all possible information. If you have questions about this medicine, talk to your doctor, pharmacist, or health care provider.  2021 Elsevier/Gold Standard (2019-08-29 15:00:03)

## 2021-03-21 ENCOUNTER — Inpatient Hospital Stay: Payer: Self-pay

## 2021-03-21 ENCOUNTER — Other Ambulatory Visit: Payer: Self-pay

## 2021-03-21 VITALS — BP 116/77 | HR 83 | Resp 18

## 2021-03-21 DIAGNOSIS — C189 Malignant neoplasm of colon, unspecified: Secondary | ICD-10-CM

## 2021-03-21 DIAGNOSIS — K631 Perforation of intestine (nontraumatic): Secondary | ICD-10-CM

## 2021-03-21 MED ORDER — HEPARIN SOD (PORK) LOCK FLUSH 100 UNIT/ML IV SOLN
500.0000 [IU] | Freq: Once | INTRAVENOUS | Status: AC | PRN
  Administered 2021-03-21: 500 [IU]
  Filled 2021-03-21: qty 5

## 2021-03-21 MED ORDER — SODIUM CHLORIDE 0.9% FLUSH
10.0000 mL | INTRAVENOUS | Status: DC | PRN
  Administered 2021-03-21: 10 mL
  Filled 2021-03-21: qty 10

## 2021-03-26 ENCOUNTER — Encounter: Payer: Self-pay | Admitting: Oncology

## 2021-03-27 ENCOUNTER — Telehealth (HOSPITAL_COMMUNITY): Payer: Self-pay | Admitting: Dietician

## 2021-03-27 NOTE — Telephone Encounter (Signed)
Nutrition  Attempted to contact patient via telephone for nutrition follow-up. Patient did not answer. Voicemail left with request for return call. Contact information provided.

## 2021-03-29 ENCOUNTER — Other Ambulatory Visit: Payer: Self-pay | Admitting: Oncology

## 2021-03-31 ENCOUNTER — Inpatient Hospital Stay: Payer: Self-pay

## 2021-03-31 ENCOUNTER — Inpatient Hospital Stay: Payer: Self-pay | Admitting: Oncology

## 2021-03-31 NOTE — Progress Notes (Deleted)
  [facility] OFFICE PROGRESS NOTE   Diagnosis:   INTERVAL HISTORY:    Objective:  Vital signs in last 24 hours:  There were no vitals taken for this visit.    HEENT: *** Lymphatics: *** Resp: *** Cardio: *** GI: *** Vascular: *** Neuro:***  Skin:***   Portacath/PICC-without erythema  Lab Results:  Lab Results  Component Value Date   WBC 4.4 03/19/2021   HGB 11.1 (L) 03/19/2021   HCT 34.3 (L) 03/19/2021   MCV 80.5 03/19/2021   PLT 155 03/19/2021   NEUTROABS 1.7 03/19/2021    CMP  Lab Results  Component Value Date   NA 131 (L) 03/19/2021   K 4.0 03/19/2021   CL 99 03/19/2021   CO2 23 03/19/2021   GLUCOSE 144 (H) 03/19/2021   BUN 26 (H) 03/19/2021   CREATININE 1.26 (H) 03/19/2021   CALCIUM 9.1 03/19/2021   PROT 7.3 03/19/2021   ALBUMIN 4.1 03/19/2021   AST 25 03/19/2021   ALT 17 03/19/2021   ALKPHOS 143 (H) 03/19/2021   BILITOT 0.6 03/19/2021   GFRNONAA >60 03/19/2021    Lab Results  Component Value Date   CEA1 0.8 11/19/2020     Medications: I have reviewed the patient's current medications.   Assessment/Plan: Stage IV adenocarcinoma of the colon -11/15/2020 CT abdomen/pelvis with contrast-long segment masslike mural thickening of the cecum, posterior perforation at the site of the cecal wall thickening with multilocular right retroperitoneal abscess, indeterminate hypodensity in the inferior right lobe of the liver. -11/19/2020 CEA 0.8 -11/20/2020 MRI of the abdomen and pelvis with and without contrast-3.5 x 2.5 cm heterogeneous enhancing lesion in the inferior peripheral right liver with 2 smaller lesions with similar features. -11/20/2020 ultrasound-guided liver biopsy consistent with adenocarcinoma.  Foundation 1-MSS, tumor mutation burden 8,KRAS wt -11/25/2020 CT abdomen/pelvis with contrast-interval placement of a right lower quadrant percutaneous pigtail drainage catheter with near resolution of the retroperitoneal fluid collection,  redemonstrated large fungating mass at the cecal base measuring 11.9 x 9.1 x 8.4 cm with enlarged lymph nodes in the right lower quadrant mesocolon. -11/28/2020 exploratory laparotomy, right colectomy, ileostomy creation, cystoscopy with stent placement             -Surgical pathology showed adenocarcinoma, moderately differentiated, 12 cm, 0/14+ lymph nodes, lymphovascular space invasion present, radial margin involved by invasive adenocarcinoma, appendix with intraluminal             adenocarcinoma, pT4, PN 0 -12/09/2020 CT abdomen/pelvis with contrast-interval right colectomy and right lower quadrant ileostomy, stable right retroperitoneal soft tissue density along the psoas and iliacus muscles, mild progression of liver metastases since prior study. -Cycle 1 FOLFOX 12/24/2020 -Cycle 2 FOLFOX 01/06/2021 -Cycle 3 FOLFOX 01/20/2021 -Cycle 4 FOLFOX 02/03/2021 -Cycle 5 FOLFOX 02/17/2021 -Cycle 6 FOLFOX 03/03/2021 -Restaging CTs 03/05/2021- substantial interval decrease in size of predominantly hypodense lesions throughout the liver.  Significant interval resolution of previously seen extensive, heterogeneous soft tissue in the right retroperitoneal soft tissues and paracolic gutter overlying the psoas and iliacus muscles. -Cycle 7 FOLFOX 03/19/2021 2.  Sepsis secondary to perforated colon 3.  Retroperitoneal abscess 4.  Protein calorie malnutrition 5.  Thrombocytosis 6.  Iron deficiency anemia 7.  COVID-19 infection, 11/15/2020 8.  Tobacco abuse  9.  Family history of colon cancer 10.  Port-A-Cath placement, Dr. [name] 12/12/2020    Disposition:  [name], MD  03/31/2021  7:09 AM

## 2021-04-03 ENCOUNTER — Other Ambulatory Visit: Payer: Self-pay

## 2021-04-03 ENCOUNTER — Inpatient Hospital Stay: Payer: Self-pay

## 2021-04-03 ENCOUNTER — Inpatient Hospital Stay (HOSPITAL_BASED_OUTPATIENT_CLINIC_OR_DEPARTMENT_OTHER): Payer: Self-pay | Admitting: Oncology

## 2021-04-03 ENCOUNTER — Encounter: Payer: Self-pay | Admitting: *Deleted

## 2021-04-03 VITALS — BP 114/82 | HR 80 | Temp 97.8°F | Resp 18 | Ht 75.0 in | Wt 149.6 lb

## 2021-04-03 DIAGNOSIS — K631 Perforation of intestine (nontraumatic): Secondary | ICD-10-CM

## 2021-04-03 DIAGNOSIS — C189 Malignant neoplasm of colon, unspecified: Secondary | ICD-10-CM

## 2021-04-03 DIAGNOSIS — C787 Secondary malignant neoplasm of liver and intrahepatic bile duct: Secondary | ICD-10-CM

## 2021-04-03 LAB — CBC WITH DIFFERENTIAL (CANCER CENTER ONLY)
Abs Immature Granulocytes: 0.01 10*3/uL (ref 0.00–0.07)
Basophils Absolute: 0 10*3/uL (ref 0.0–0.1)
Basophils Relative: 1 %
Eosinophils Absolute: 0.2 10*3/uL (ref 0.0–0.5)
Eosinophils Relative: 4 %
HCT: 30.7 % — ABNORMAL LOW (ref 39.0–52.0)
Hemoglobin: 9.7 g/dL — ABNORMAL LOW (ref 13.0–17.0)
Immature Granulocytes: 0 %
Lymphocytes Relative: 38 %
Lymphs Abs: 1.4 10*3/uL (ref 0.7–4.0)
MCH: 26.7 pg (ref 26.0–34.0)
MCHC: 31.6 g/dL (ref 30.0–36.0)
MCV: 84.6 fL (ref 80.0–100.0)
Monocytes Absolute: 0.5 10*3/uL (ref 0.1–1.0)
Monocytes Relative: 14 %
Neutro Abs: 1.5 10*3/uL — ABNORMAL LOW (ref 1.7–7.7)
Neutrophils Relative %: 43 %
Platelet Count: 113 10*3/uL — ABNORMAL LOW (ref 150–400)
RBC: 3.63 MIL/uL — ABNORMAL LOW (ref 4.22–5.81)
RDW: 21.2 % — ABNORMAL HIGH (ref 11.5–15.5)
WBC Count: 3.6 10*3/uL — ABNORMAL LOW (ref 4.0–10.5)
nRBC: 0 % (ref 0.0–0.2)

## 2021-04-03 LAB — CMP (CANCER CENTER ONLY)
ALT: 17 U/L (ref 0–44)
AST: 23 U/L (ref 15–41)
Albumin: 3.7 g/dL (ref 3.5–5.0)
Alkaline Phosphatase: 131 U/L — ABNORMAL HIGH (ref 38–126)
Anion gap: 8 (ref 5–15)
BUN: 10 mg/dL (ref 6–20)
CO2: 23 mmol/L (ref 22–32)
Calcium: 9.2 mg/dL (ref 8.9–10.3)
Chloride: 105 mmol/L (ref 98–111)
Creatinine: 0.74 mg/dL (ref 0.61–1.24)
GFR, Estimated: >60 mL/min (ref >60)
Glucose, Bld: 119 mg/dL — ABNORMAL HIGH (ref 70–99)
Potassium: 3.8 mmol/L (ref 3.5–5.1)
Sodium: 136 mmol/L (ref 135–145)
Total Bilirubin: 0.5 mg/dL (ref 0.3–1.2)
Total Protein: 6.6 g/dL (ref 6.5–8.1)

## 2021-04-03 MED ORDER — SODIUM CHLORIDE 0.9 % IV SOLN
5000.0000 mg | INTRAVENOUS | Status: DC
  Administered 2021-04-03: 5000 mg via INTRAVENOUS
  Filled 2021-04-03: qty 100

## 2021-04-03 MED ORDER — PALONOSETRON HCL INJECTION 0.25 MG/5ML
0.2500 mg | Freq: Once | INTRAVENOUS | Status: AC
  Administered 2021-04-03: 0.25 mg via INTRAVENOUS
  Filled 2021-04-03: qty 5

## 2021-04-03 MED ORDER — OXALIPLATIN CHEMO INJECTION 100 MG/20ML
65.0000 mg/m2 | Freq: Once | INTRAVENOUS | Status: AC
  Administered 2021-04-03: 120 mg via INTRAVENOUS
  Filled 2021-04-03: qty 24

## 2021-04-03 MED ORDER — SODIUM CHLORIDE 0.9 % IV SOLN
10.0000 mg | Freq: Once | INTRAVENOUS | Status: AC
  Administered 2021-04-03: 10 mg via INTRAVENOUS
  Filled 2021-04-03: qty 1

## 2021-04-03 MED ORDER — DEXTROSE 5 % IV SOLN
Freq: Once | INTRAVENOUS | Status: AC
Start: 2021-04-03 — End: 2021-04-03
  Filled 2021-04-03: qty 250

## 2021-04-03 MED ORDER — LEUCOVORIN CALCIUM INJECTION 350 MG
400.0000 mg/m2 | Freq: Once | INTRAVENOUS | Status: AC
  Administered 2021-04-03: 744 mg via INTRAVENOUS
  Filled 2021-04-03: qty 37.2

## 2021-04-03 MED ORDER — FLUOROURACIL CHEMO INJECTION 2.5 GM/50ML
400.0000 mg/m2 | Freq: Once | INTRAVENOUS | Status: AC
  Administered 2021-04-03: 750 mg via INTRAVENOUS
  Filled 2021-04-03: qty 15

## 2021-04-03 NOTE — Patient Instructions (Signed)
Eric [name]   Discharge Instructions: Thank you for choosing [facility] to provide your oncology and hematology care.   If you have a lab appointment with the [facility], please go directly to the [facility] and check in at the registration area.   Wear comfortable clothing and clothing appropriate for easy access to any Portacath or PICC line.   We strive to give you quality time with your provider. You may need to reschedule your appointment if you arrive late (15 or more minutes).  Arriving late affects you and other patients whose appointments are after yours.  Also, if you miss three or more appointments without notifying the office, you may be dismissed from the clinic at the provider's discretion.      For prescription refill requests, have your pharmacy contact our office and allow 72 hours for refills to be completed.    Today you received the following chemotherapy and/or immunotherapy agents Oxaliplatin (ELOXATIN), Leucovorin & Flourouracil (ADRUCIL).      To help prevent nausea and vomiting after your treatment, we encourage you to take your nausea medication as directed.  BELOW ARE SYMPTOMS THAT SHOULD BE REPORTED IMMEDIATELY: *FEVER GREATER THAN 100.4 F (38 C) OR HIGHER *CHILLS OR SWEATING *NAUSEA AND VOMITING THAT IS NOT CONTROLLED WITH YOUR NAUSEA MEDICATION *UNUSUAL SHORTNESS OF BREATH *UNUSUAL BRUISING OR BLEEDING *URINARY PROBLEMS (pain or burning when urinating, or frequent urination) *BOWEL PROBLEMS (unusual diarrhea, constipation, pain near the anus) TENDERNESS IN MOUTH AND THROAT WITH OR WITHOUT PRESENCE OF ULCERS (sore throat, sores in mouth, or a toothache) UNUSUAL RASH, SWELLING OR PAIN  UNUSUAL VAGINAL DISCHARGE OR ITCHING   Items with * indicate a potential emergency and should be followed up as soon as possible or go to the Emergency Department if any problems should occur.  Please show the CHEMOTHERAPY ALERT  CARD or IMMUNOTHERAPY ALERT CARD at check-in to the Emergency Department and triage nurse.  Should you have questions after your visit or need to cancel or reschedule your appointment, please contact [facility]  Dept: [phone]  and follow the prompts.  Office hours are 8:00 a.m. to 4:30 p.m. Monday - Friday. Please note that voicemails left after 4:00 p.m. may not be returned until the following business day.  We are closed weekends and major holidays. You have access to a nurse at all times for urgent questions. Please call the main number to the clinic Dept: [phone] and follow the prompts.   For any non-urgent questions, you may also contact your provider using MyChart. We now offer e-Visits for anyone 50 and older to request care online for non-urgent symptoms. For details visit [email or web address].   Also download the MyChart app! Go to the app store, search "MyChart", open the app, select [facility], and log in with your MyChart username and password.  Due to Covid, a mask is required upon entering the hospital/clinic. If you do not have a mask, one will be given to you upon arrival. For doctor visits, patients may have 1 support person aged 68 or older with them. For treatment visits, patients cannot have anyone with them due to current Covid guidelines and our immunocompromised population.   Oxaliplatin Injection What is this medication? OXALIPLATIN (ox AL i PLA tin) is a chemotherapy drug. It targets fast dividing cells, like cancer cells, and causes these cells to die. This medicine is usedto treat cancers of the colon and rectum, and many  other cancers. This medicine may be used for other purposes; ask your health care provider orpharmacist if you have questions. COMMON BRAND NAME(S): Eloxatin What should I tell my care team before I take this medication? They need to know if you have any of these conditions: heart disease history of irregular  heartbeat liver disease low blood counts, like white cells, platelets, or red blood cells lung or breathing disease, like asthma take medicines that treat or prevent blood clots tingling of the fingers or toes, or other nerve disorder an unusual or allergic reaction to oxaliplatin, other chemotherapy, other medicines, foods, dyes, or preservatives pregnant or trying to get pregnant breast-feeding How should I use this medication? This drug is given as an infusion into a vein. It is administered in a hospitalor clinic by a specially trained health care professional. Talk to your pediatrician regarding the use of this medicine in children.Special care may be needed. Overdosage: If you think you have taken too much of this medicine contact apoison control center or emergency room at once. NOTE: This medicine is only for you. Do not share this medicine with others. What if I miss a dose? It is important not to miss a dose. Call your doctor or health careprofessional if you are unable to keep an appointment. What may interact with this medication? Do not take this medicine with any of the following medications: cisapride dronedarone pimozide thioridazine This medicine may also interact with the following medications: aspirin and aspirin-like medicines certain medicines that treat or prevent blood clots like warfarin, apixaban, dabigatran, and rivaroxaban cisplatin cyclosporine diuretics medicines for infection like acyclovir, adefovir, amphotericin B, bacitracin, cidofovir, foscarnet, ganciclovir, gentamicin, pentamidine, vancomycin NSAIDs, medicines for pain and inflammation, like ibuprofen or naproxen other medicines that prolong the QT interval (an abnormal heart rhythm) pamidronate zoledronic acid This list may not describe all possible interactions. Give your health care provider a list of all the medicines, herbs, non-prescription drugs, or dietary supplements you use. Also tell  them if you smoke, drink alcohol, or use illegaldrugs. Some items may interact with your medicine. What should I watch for while using this medication? Your condition will be monitored carefully while you are receiving thismedicine. You may need blood work done while you are taking this medicine. This medicine may make you feel generally unwell. This is not uncommon as chemotherapy can affect healthy cells as well as cancer cells. Report any side effects. Continue your course of treatment even though you feel ill unless yourhealthcare professional tells you to stop. This medicine can make you more sensitive to cold. Do not drink cold drinks or use ice. Cover exposed skin before coming in contact with cold temperatures or cold objects. When out in cold weather wear warm clothing and cover your mouth and nose to warm the air that goes into your lungs. Tell your doctor if you getsensitive to the cold. Do not become pregnant while taking this medicine or for 9 months after stopping it. Women should inform their health care professional if they wish to become pregnant or think they might be pregnant. Men should not father a child while taking this medicine and for 6 months after stopping it. There is potential for serious side effects to an unborn child. Talk to your health careprofessional for more information. Do not breast-feed a child while taking this medicine or for 3 months afterstopping it. This medicine has caused ovarian failure in some women. This medicine may make it more difficult to get pregnant.  Talk to your health care professional if [place] concerned about your fertility. This medicine has caused decreased sperm counts in some men. This may make it more difficult to father a child. Talk to your health care professional if [place] concerned about your fertility. This medicine may increase your risk of getting an infection. Call your health care professional for advice if you get a fever, chills,  or sore throat, or other symptoms of a cold or flu. Do not treat yourself. Try to avoid beingaround people who are sick. Avoid taking medicines that contain aspirin, acetaminophen, ibuprofen, naproxen, or ketoprofen unless instructed by your health care professional.These medicines may hide a fever. Be careful brushing or flossing your teeth or using a toothpick because you may get an infection or bleed more easily. If you have any dental work done, Primary school teacher you are receiving this medicine. What side effects may I notice from receiving this medication? Side effects that you should report to your doctor or health care professionalas soon as possible: allergic reactions like skin rash, itching or hives, swelling of the face, lips, or tongue breathing problems cough low blood counts - this medicine may decrease the number of white blood cells, red blood cells, and platelets. You may be at increased risk for infections and bleeding nausea, vomiting pain, redness, or irritation at site where injected pain, tingling, numbness in the hands or feet signs and symptoms of bleeding such as bloody or black, tarry stools; red or dark brown urine; spitting up blood or brown material that looks like coffee grounds; red spots on the skin; unusual bruising or bleeding from the eyes, gums, or nose signs and symptoms of a dangerous change in heartbeat or heart rhythm like chest pain; dizziness; fast, irregular heartbeat; palpitations; feeling faint or lightheaded; falls signs and symptoms of infection like fever; chills; cough; sore throat; pain or trouble passing urine signs and symptoms of liver injury like dark yellow or brown urine; general ill feeling or flu-like symptoms; light-colored stools; loss of appetite; nausea; right upper belly pain; unusually weak or tired; yellowing of the eyes or skin signs and symptoms of low red blood cells or anemia such as unusually weak or tired; feeling faint or  lightheaded; falls signs and symptoms of muscle injury like dark urine; trouble passing urine or change in the amount of urine; unusually weak or tired; muscle pain; back pain Side effects that usually do not require medical attention (report to yourdoctor or health care professional if they continue or are bothersome): changes in taste diarrhea gas hair loss loss of appetite mouth sores This list may not describe all possible side effects. Call your doctor for medical advice about side effects. You may report side effects to FDA at1-800-FDA-1088. Where should I keep my medication? This drug is given in a hospital or clinic and will not be stored at home. NOTE: This sheet is a summary. It may not cover all possible information. If you have questions about this medicine, talk to your doctor, pharmacist, orhealth care provider.  2022 Elsevier/Gold Standard (2019-02-15 12:20:35)  Leucovorin injection What is this medication? LEUCOVORIN (loo koe VOR in) is used to prevent or treat the harmful effects of some medicines. This medicine is used to treat anemia caused by a low amount of folic acid in the body. It is also used with 5-fluorouracil (5-FU) to treatcolon cancer. This medicine may be used for other purposes; ask your health care provider orpharmacist if you have questions. What should I  tell my care team before I take this medication? They need to know if you have any of these conditions: anemia from low levels of vitamin B-12 in the blood an unusual or allergic reaction to leucovorin, folic acid, other medicines, foods, dyes, or preservatives pregnant or trying to get pregnant breast-feeding How should I use this medication? This medicine is for injection into a muscle or into a vein. It is given by ahealth care professional in a hospital or clinic setting. Talk to your pediatrician regarding the use of this medicine in children.Special care may be needed. Overdosage: If you think you  have taken too much of this medicine contact apoison control center or emergency room at once. NOTE: This medicine is only for you. Do not share this medicine with others. What if I miss a dose? This does not apply. What may interact with this medication? capecitabine fluorouracil phenobarbital phenytoin primidone trimethoprim-sulfamethoxazole This list may not describe all possible interactions. Give your health care provider a list of all the medicines, herbs, non-prescription drugs, or dietary supplements you use. Also tell them if you smoke, drink alcohol, or use illegaldrugs. Some items may interact with your medicine. What should I watch for while using this medication? Your condition will be monitored carefully while you are receiving thismedicine. This medicine may increase the side effects of 5-fluorouracil, 5-FU. Tell your doctor or health care professional if you have diarrhea or mouth sores that donot get better or that get worse. What side effects may I notice from receiving this medication? Side effects that you should report to your doctor or health care professionalas soon as possible: allergic reactions like skin rash, itching or hives, swelling of the face, lips, or tongue breathing problems fever, infection mouth sores unusual bleeding or bruising unusually weak or tired Side effects that usually do not require medical attention (report to yourdoctor or health care professional if they continue or are bothersome): constipation or diarrhea loss of appetite nausea, vomiting This list may not describe all possible side effects. Call your doctor for medical advice about side effects. You may report side effects to FDA at1-800-FDA-1088. Where should I keep my medication? This drug is given in a hospital or clinic and will not be stored at home. NOTE: This sheet is a summary. It may not cover all possible information. If you have questions about this medicine, talk to your  doctor, pharmacist, orhealth care provider.  2022 Elsevier/Gold Standard (2008-04-03 16:50:29)  Fluorouracil, 5-FU injection What is this medication? FLUOROURACIL, 5-FU (flure oh YOOR a sil) is a chemotherapy drug. It slows the growth of cancer cells. This medicine is used to treat many types of cancer like breast cancer, colon or rectal cancer, pancreatic cancer, and stomachcancer. This medicine may be used for other purposes; ask your health care provider orpharmacist if you have questions. COMMON BRAND NAME(S): Adrucil What should I tell my care team before I take this medication? They need to know if you have any of these conditions: blood disorders dihydropyrimidine dehydrogenase (DPD) deficiency infection (especially a virus infection such as chickenpox, cold sores, or herpes) kidney disease liver disease malnourished, poor nutrition recent or ongoing radiation therapy an unusual or allergic reaction to fluorouracil, other chemotherapy, other medicines, foods, dyes, or preservatives pregnant or trying to get pregnant breast-feeding How should I use this medication? This drug is given as an infusion or injection into a vein. It is administeredin a hospital or clinic by a specially trained health care professional. Talk to  your pediatrician regarding the use of this medicine in children.Special care may be needed. Overdosage: If you think you have taken too much of this medicine contact apoison control center or emergency room at once. NOTE: This medicine is only for you. Do not share this medicine with others. What if I miss a dose? It is important not to miss your dose. Call your doctor or health careprofessional if you are unable to keep an appointment. What may interact with this medication? Do not take this medicine with any of the following medications: live virus vaccines This medicine may also interact with the following medications: medicines that treat or prevent blood  clots like warfarin, enoxaparin, and dalteparin This list may not describe all possible interactions. Give your health care provider a list of all the medicines, herbs, non-prescription drugs, or dietary supplements you use. Also tell them if you smoke, drink alcohol, or use illegaldrugs. Some items may interact with your medicine. What should I watch for while using this medication? Visit your doctor for checks on your progress. This drug may make you feel generally unwell. This is not uncommon, as chemotherapy can affect healthy cells as well as cancer cells. Report any side effects. Continue your course oftreatment even though you feel ill unless your doctor tells you to stop. In some cases, you may be given additional medicines to help with side effects.Follow all directions for their use. Call your doctor or health care professional for advice if you get a fever, chills or sore throat, or other symptoms of a cold or flu. Do not treat yourself. This drug decreases your body's ability to fight infections. Try toavoid being around people who are sick. This medicine may increase your risk to bruise or bleed. Call your doctor orhealth care professional if you notice any unusual bleeding. Be careful brushing and flossing your teeth or using a toothpick because you may get an infection or bleed more easily. If you have any dental work done,tell your dentist you are receiving this medicine. Avoid taking products that contain aspirin, acetaminophen, ibuprofen, naproxen, or ketoprofen unless instructed by your doctor. These medicines may hide afever. Do not become pregnant while taking this medicine. Women should inform their doctor if they wish to become pregnant or think they might be pregnant. There is a potential for serious side effects to an unborn child. Talk to your health care professional or pharmacist for more information. Do not breast-feed aninfant while taking this medicine. Men should inform  their doctor if they wish to father a child. This medicinemay lower sperm counts. Do not treat diarrhea with over the counter products. Contact your doctor ifyou have diarrhea that lasts more than 2 days or if it is severe and watery. This medicine can make you more sensitive to the sun. Keep out of the sun. If you cannot avoid being in the sun, wear protective clothing and use sunscreen.Do not use sun lamps or tanning beds/booths. What side effects may I notice from receiving this medication? Side effects that you should report to your doctor or health care professionalas soon as possible: allergic reactions like skin rash, itching or hives, swelling of the face, lips, or tongue low blood counts - this medicine may decrease the number of white blood cells, red blood cells and platelets. You may be at increased risk for infections and bleeding. signs of infection - fever or chills, cough, sore throat, pain or difficulty passing urine signs of decreased platelets or bleeding - bruising, pinpoint  red spots on the skin, black, tarry stools, blood in the urine signs of decreased red blood cells - unusually weak or tired, fainting spells, lightheadedness breathing problems changes in vision chest pain mouth sores nausea and vomiting pain, swelling, redness at site where injected pain, tingling, numbness in the hands or feet redness, swelling, or sores on hands or feet stomach pain unusual bleeding Side effects that usually do not require medical attention (report to yourdoctor or health care professional if they continue or are bothersome): changes in finger or toe nails diarrhea dry or itchy skin hair loss headache loss of appetite sensitivity of eyes to the light stomach upset unusually teary eyes This list may not describe all possible side effects. Call your doctor for medical advice about side effects. You may report side effects to FDA at1-800-FDA-1088. Where should I keep my  medication? This drug is given in a hospital or clinic and will not be stored at home. NOTE: This sheet is a summary. It may not cover all possible information. If you have questions about this medicine, talk to your doctor, pharmacist, orhealth care provider.  2022 Elsevier/Gold Standard (2019-08-29 15:00:03)

## 2021-04-03 NOTE — Progress Notes (Signed)
[facility] OFFICE PROGRESS NOTE   Diagnosis: Colon cancer  INTERVAL HISTORY:   [name] completed another cycle of FOLFOX on 03/19/2021.  No nausea/vomiting, mouth sores, or diarrhea following chemotherapy.  He reports mild cold sensitivity.  No neuropathy symptoms at present.  He had nausea and vomiting on 03/30/2021 after drinking a chocolate milk supplement.  He feels the chocolate caused his nausea.  Objective:  Vital signs in last 24 hours:  Blood pressure 114/82, pulse 80, temperature 97.8 F (36.6 C), temperature source Oral, resp. rate 18, height _0  (1.905 m), weight 149 lb 9.6 oz (67.9 kg), SpO2 100 %.    HEENT: No thrush or ulcers Resp: Lungs clear bilaterally Cardio: Regular rate and rhythm GI: No hepatomegaly, right abdomen ileostomy Vascular: No leg edema Neuro: Very mild loss of vibratory sense at the fingertips bilaterally   Portacath/PICC-without erythema  Lab Results:  Lab Results  Component Value Date   WBC 3.6 (L) 04/03/2021   HGB 9.7 (L) 04/03/2021   HCT 30.7 (L) 04/03/2021   MCV 84.6 04/03/2021   PLT 113 (L) 04/03/2021   NEUTROABS 1.5 (L) 04/03/2021    CMP  Lab Results  Component Value Date   NA 131 (L) 03/19/2021   K 4.0 03/19/2021   CL 99 03/19/2021   CO2 23 03/19/2021   GLUCOSE 144 (H) 03/19/2021   BUN 26 (H) 03/19/2021   CREATININE 1.26 (H) 03/19/2021   CALCIUM 9.1 03/19/2021   PROT 7.3 03/19/2021   ALBUMIN 4.1 03/19/2021   AST 25 03/19/2021   ALT 17 03/19/2021   ALKPHOS 143 (H) 03/19/2021   BILITOT 0.6 03/19/2021   GFRNONAA >60 03/19/2021    Lab Results  Component Value Date   CEA1 0.8 11/19/2020     Medications: I have reviewed the patient's current medications.   Assessment/Plan:  Stage IV adenocarcinoma of the colon -11/15/2020 CT abdomen/pelvis with contrast-long segment masslike mural thickening of the cecum, posterior perforation at the site of the cecal wall thickening with multilocular right  retroperitoneal abscess, indeterminate hypodensity in the inferior right lobe of the liver. -11/19/2020 CEA 0.8 -11/20/2020 MRI of the abdomen and pelvis with and without contrast-3.5 x 2.5 cm heterogeneous enhancing lesion in the inferior peripheral right liver with 2 smaller lesions with similar features. -11/20/2020 ultrasound-guided liver biopsy consistent with adenocarcinoma.  Foundation 1-MSS, tumor mutation burden 8,KRAS wt -11/25/2020 CT abdomen/pelvis with contrast-interval placement of a right lower quadrant percutaneous pigtail drainage catheter with near resolution of the retroperitoneal fluid collection, redemonstrated large fungating mass at the cecal base measuring 11.9 x 9.1 x 8.4 cm with enlarged lymph nodes in the right lower quadrant mesocolon. -11/28/2020 exploratory laparotomy, right colectomy, ileostomy creation, cystoscopy with stent placement             -Surgical pathology showed adenocarcinoma, moderately differentiated, 12 cm, 0/14+ lymph nodes, lymphovascular space invasion present, radial margin involved by invasive adenocarcinoma, appendix with intraluminal             adenocarcinoma, pT4, PN 0 -12/09/2020 CT abdomen/pelvis with contrast-interval right colectomy and right lower quadrant ileostomy, stable right retroperitoneal soft tissue density along the psoas and iliacus muscles, mild progression of liver metastases since prior study. -Cycle 1 FOLFOX 12/24/2020 -Cycle 2 FOLFOX 01/06/2021 -Cycle 3 FOLFOX 01/20/2021 -Cycle 4 FOLFOX 02/03/2021 -Cycle 5 FOLFOX 02/17/2021 -Cycle 6 FOLFOX 03/03/2021 -Restaging CTs 03/05/2021- substantial interval decrease in size of predominantly hypodense lesions throughout the liver.  Significant interval resolution of previously seen extensive,  heterogeneous soft tissue in the right retroperitoneal soft tissues and paracolic gutter overlying the psoas and iliacus muscles. -Cycle 7 FOLFOX 03/19/2021 -Cycle 8 FOLFOX 04/03/2021, oxaliplatin dose reduced  secondary to neutropenia and thrombocytopenia 2.  Sepsis secondary to perforated colon 3.  Retroperitoneal abscess 4.  Protein calorie malnutrition 5.  Thrombocytosis 6.  Iron deficiency anemia 7.  COVID-19 infection, 11/15/2020 8.  Tobacco abuse  9.  Family history of colon cancer 10.  Port-A-Cath placement, Dr. [name] 12/12/2020    Disposition: [name] appears stable.  Chemotherapy is delayed this week secondary to nausea and vomiting he experienced earlier in the week.  The nausea appears to been related to a nutrition supplement.  He has mild neutropenia today.  We discussed the risk of infection.  He agrees to proceed with chemotherapy today.  He will call for a fever or symptoms of infection.  The oxaliplatin will be dose reduced secondary to mild neutropenia and thrombocytopenia.  [name] will return for an office visit and chemotherapy in 2 weeks.  [name], MD  04/03/2021  12:04 PM

## 2021-04-03 NOTE — Progress Notes (Addendum)
Patient was provided gas card, case of Ensure and bag of groceries at visit today. Reminded patient it is important for his sister to bring in a letter of support for him.

## 2021-04-04 ENCOUNTER — Telehealth: Payer: Self-pay | Admitting: General Practice

## 2021-04-04 ENCOUNTER — Inpatient Hospital Stay: Payer: Self-pay | Admitting: General Practice

## 2021-04-04 DIAGNOSIS — C189 Malignant neoplasm of colon, unspecified: Secondary | ICD-10-CM

## 2021-04-04 DIAGNOSIS — C787 Secondary malignant neoplasm of liver and intrahepatic bile duct: Secondary | ICD-10-CM

## 2021-04-04 NOTE — Progress Notes (Signed)
[facility] Work  Initial Assessment   Eric [name] is a 50 y.o. year old male contacted by phone, spoke w Eric [name] who handles all his affairs.  Clinical Social Work was referred by nurse referral for assessment of psychosocial needs.   SDOH (Social Determinants of Health) assessments performed: Yes   Distress Screen completed: Yes ONCBCN DISTRESS SCREENING 12/18/2020  Screening Type Initial Screening  Distress experienced in past week (1-10) 6  Practical problem type Insurance  Emotional problem type Nervousness/Anxiety;Adjusting to illness;Adjusting to appearance changes  Information Concerns Type Lack of info about diagnosis;Lack of info about treatment  Physical Problem type Pain;Nausea/vomiting;Sleep/insomnia;Bathing/dressing;Tingling hands/feet  Physician notified of physical symptoms Yes  Referral to clinical social work Yes  Referral to dietition Yes  Referral to financial advocate No  Referral to support programs No  Referral to palliative care No      Family/Social Information:  Housing Arrangement: patient lives with roommate Family members/support persons in your life? Family Transportation concerns: no, Eric transports to appointments, she comes from a distance to transport him  Employment: Legally disabled. Was working as a IT consultant, had worked for many years.   Income source: Social Security Disability - disability will start Sept 2022.  He has recently been approved for the [employer] and can begin to access it. Supported by [facility] and [facility] who are helping pay for medications and rent and living expenses Applied for [employer] and was initially denied due to being over income, reapplied two weeks ago and application was resubmitted to [facility].  He has been assigned a Medicaid "Eric [name]" - he is hoping that Medicaid may take care of some of his outstanding medical bills.   Financial concerns: Yes, current  concerns Type of concern: Food access concerns: yes, now has Physicist, medical.  Just got this 04/03/21 Applying for Medicaid, approved for disability but will not get Medicare until 24 month wait period.  Gave Eric information on [employer]cnavigator[employer] so they can ask about coverage on [employer] Religious or spiritual practice: no Medication Concerns: yes, unable to afford - can now get at [facility] as he has been approved for [employer] Currently in place:  none at this time  Coping/ Adjustment to diagnosis: Patient understands treatment plan and what happens next? yes, diagnosed with Stage IV colorectal cancer, per Eric he sometimes gets overwhelmed and may not completely understand the severity of his diagnosis.  He hopes to "beat this and get back to work."  Since he got ostomy done, "he wont go out in public", will not go "even to grocery store" but is "bored with staying home."  He has lived w his mother "all his life" and was "in control of the household and taking care of her."  He was used to being the caregiver, not the one cared for.   Concerns about diagnosis and/or treatment:  "cant control output/sounds of ostomy", this has limited his willingness to be in public.  "Its an embarrassment to him"   Patient reported stressors: Insurance, Work/ school, Adjusting to my illness, and Isolation/ feeling alone Hopes and priorities: hopes to return to work as soon as possible - fatigue is a barrier Patient enjoys time with family/ friends and doing small jobs around the house, tires easily and has learned to take a break when tired. Current coping skills/ strengths: Supportive family/friends    SUMMARY: Current SDOH Barriers:  Financial constraints related to high medical bills, lack  of income  Clinical Social Work Clinical Goal(s):  patient will work with SW to address concerns related to access to resources  Interventions: Discussed common feeling and emotions when being  diagnosed with cancer, and the importance of support during treatment Informed patient of the support team roles and support services at [facility] Provided Elma contact information and encouraged patient to call with any questions or concerns Referred patient to [employer] for information on health insurance.   He is concerned about the cost of ostomy supplies which are paid out of pocket.     Follow Up Plan: Patient will contact CSW with any support or resource needs Patient verbalizes understanding of plan: Yes    [facility], [place], [place] Social Worker Phone:  [phone]

## 2021-04-04 NOTE — Telephone Encounter (Signed)
[facility] CSW Progress Notes  Call to patient and sister to assess for needs resources.  Unable to reach either party - per contact at sister's number/home number, sister is asleep and needs to be called in afternoon.  Appt rescheduled for 1 PM and VM left on mobile requesting call back.  Edwyna Shell, LCSW Clinical Social Worker Phone:  [phone]

## 2021-04-05 ENCOUNTER — Inpatient Hospital Stay: Payer: Self-pay

## 2021-04-05 ENCOUNTER — Other Ambulatory Visit: Payer: Self-pay

## 2021-04-05 DIAGNOSIS — K631 Perforation of intestine (nontraumatic): Secondary | ICD-10-CM

## 2021-04-05 DIAGNOSIS — C189 Malignant neoplasm of colon, unspecified: Secondary | ICD-10-CM

## 2021-04-05 MED ORDER — SODIUM CHLORIDE 0.9% FLUSH
10.0000 mL | INTRAVENOUS | Status: DC | PRN
  Administered 2021-04-05: 10 mL
  Filled 2021-04-05: qty 10

## 2021-04-05 MED ORDER — HEPARIN SOD (PORK) LOCK FLUSH 100 UNIT/ML IV SOLN
500.0000 [IU] | Freq: Once | INTRAVENOUS | Status: AC | PRN
  Administered 2021-04-05: 500 [IU]
  Filled 2021-04-05: qty 5

## 2021-04-07 ENCOUNTER — Telehealth (HOSPITAL_COMMUNITY): Payer: Self-pay | Admitting: Dietician

## 2021-04-07 NOTE — Telephone Encounter (Signed)
Nutrition  Second attempt made to contact patient via telephone for nutrition follow-up. Patient did not answer. Left message with request for return call. Contact information provided.

## 2021-04-09 ENCOUNTER — Other Ambulatory Visit: Payer: Self-pay | Admitting: Oncology

## 2021-04-09 DIAGNOSIS — C189 Malignant neoplasm of colon, unspecified: Secondary | ICD-10-CM

## 2021-04-10 ENCOUNTER — Encounter: Payer: Self-pay | Admitting: Oncology

## 2021-04-14 ENCOUNTER — Other Ambulatory Visit: Payer: Self-pay | Admitting: Oncology

## 2021-04-15 ENCOUNTER — Inpatient Hospital Stay: Payer: Self-pay

## 2021-04-15 ENCOUNTER — Other Ambulatory Visit: Payer: Self-pay

## 2021-04-15 ENCOUNTER — Inpatient Hospital Stay: Payer: Self-pay | Attending: Oncology

## 2021-04-15 ENCOUNTER — Inpatient Hospital Stay (HOSPITAL_BASED_OUTPATIENT_CLINIC_OR_DEPARTMENT_OTHER): Payer: Self-pay | Admitting: Oncology

## 2021-04-15 VITALS — BP 114/85 | HR 91 | Temp 97.8°F | Resp 18 | Ht 75.0 in | Wt 149.4 lb

## 2021-04-15 DIAGNOSIS — C189 Malignant neoplasm of colon, unspecified: Secondary | ICD-10-CM

## 2021-04-15 DIAGNOSIS — K631 Perforation of intestine (nontraumatic): Secondary | ICD-10-CM

## 2021-04-15 DIAGNOSIS — Z5111 Encounter for antineoplastic chemotherapy: Secondary | ICD-10-CM | POA: Insufficient documentation

## 2021-04-15 DIAGNOSIS — C787 Secondary malignant neoplasm of liver and intrahepatic bile duct: Secondary | ICD-10-CM | POA: Insufficient documentation

## 2021-04-15 DIAGNOSIS — Z79899 Other long term (current) drug therapy: Secondary | ICD-10-CM | POA: Insufficient documentation

## 2021-04-15 DIAGNOSIS — C180 Malignant neoplasm of cecum: Secondary | ICD-10-CM | POA: Insufficient documentation

## 2021-04-15 LAB — CBC WITH DIFFERENTIAL (CANCER CENTER ONLY)
Abs Immature Granulocytes: 0.01 10*3/uL (ref 0.00–0.07)
Basophils Absolute: 0 10*3/uL (ref 0.0–0.1)
Basophils Relative: 1 %
Eosinophils Absolute: 0.1 10*3/uL (ref 0.0–0.5)
Eosinophils Relative: 2 %
HCT: 29.9 % — ABNORMAL LOW (ref 39.0–52.0)
Hemoglobin: 9.8 g/dL — ABNORMAL LOW (ref 13.0–17.0)
Immature Granulocytes: 0 %
Lymphocytes Relative: 31 %
Lymphs Abs: 1.3 10*3/uL (ref 0.7–4.0)
MCH: 27.7 pg (ref 26.0–34.0)
MCHC: 32.8 g/dL (ref 30.0–36.0)
MCV: 84.5 fL (ref 80.0–100.0)
Monocytes Absolute: 0.6 10*3/uL (ref 0.1–1.0)
Monocytes Relative: 16 %
Neutro Abs: 2 10*3/uL (ref 1.7–7.7)
Neutrophils Relative %: 50 %
Platelet Count: 111 10*3/uL — ABNORMAL LOW (ref 150–400)
RBC: 3.54 MIL/uL — ABNORMAL LOW (ref 4.22–5.81)
RDW: 22.1 % — ABNORMAL HIGH (ref 11.5–15.5)
WBC Count: 4 10*3/uL (ref 4.0–10.5)
nRBC: 0 % (ref 0.0–0.2)

## 2021-04-15 LAB — CMP (CANCER CENTER ONLY)
ALT: 25 U/L (ref 0–44)
AST: 30 U/L (ref 15–41)
Albumin: 3.8 g/dL (ref 3.5–5.0)
Alkaline Phosphatase: 128 U/L — ABNORMAL HIGH (ref 38–126)
Anion gap: 9 (ref 5–15)
BUN: 15 mg/dL (ref 6–20)
CO2: 24 mmol/L (ref 22–32)
Calcium: 9.2 mg/dL (ref 8.9–10.3)
Chloride: 105 mmol/L (ref 98–111)
Creatinine: 1 mg/dL (ref 0.61–1.24)
GFR, Estimated: >60 mL/min (ref >60)
Glucose, Bld: 139 mg/dL — ABNORMAL HIGH (ref 70–99)
Potassium: 3.5 mmol/L (ref 3.5–5.1)
Sodium: 138 mmol/L (ref 135–145)
Total Bilirubin: 0.6 mg/dL (ref 0.3–1.2)
Total Protein: 6.8 g/dL (ref 6.5–8.1)

## 2021-04-15 MED ORDER — OXALIPLATIN CHEMO INJECTION 100 MG/20ML
65.0000 mg/m2 | Freq: Once | INTRAVENOUS | Status: AC
  Administered 2021-04-15: 120 mg via INTRAVENOUS
  Filled 2021-04-15: qty 20

## 2021-04-15 MED ORDER — PALONOSETRON HCL INJECTION 0.25 MG/5ML
0.2500 mg | Freq: Once | INTRAVENOUS | Status: AC
Start: 2021-04-15 — End: 2021-04-15
  Administered 2021-04-15: 0.25 mg via INTRAVENOUS
  Filled 2021-04-15: qty 5

## 2021-04-15 MED ORDER — DEXTROSE 5 % IV SOLN
Freq: Once | INTRAVENOUS | Status: AC
  Filled 2021-04-15: qty 250

## 2021-04-15 MED ORDER — SODIUM CHLORIDE 0.9 % IV SOLN
10.0000 mg | Freq: Once | INTRAVENOUS | Status: AC
  Administered 2021-04-15: 10 mg via INTRAVENOUS
  Filled 2021-04-15: qty 1

## 2021-04-15 MED ORDER — SODIUM CHLORIDE 0.9 % IV SOLN
2400.0000 mg/m2 | INTRAVENOUS | Status: DC
  Administered 2021-04-15: 4450 mg via INTRAVENOUS
  Filled 2021-04-15: qty 89

## 2021-04-15 MED ORDER — FLUOROURACIL CHEMO INJECTION 2.5 GM/50ML
400.0000 mg/m2 | Freq: Once | INTRAVENOUS | Status: AC
  Administered 2021-04-15: 750 mg via INTRAVENOUS
  Filled 2021-04-15: qty 15

## 2021-04-15 MED ORDER — LEUCOVORIN CALCIUM INJECTION 350 MG
400.0000 mg/m2 | Freq: Once | INTRAVENOUS | Status: AC
  Administered 2021-04-15: 744 mg via INTRAVENOUS
  Filled 2021-04-15: qty 37.2

## 2021-04-15 NOTE — Progress Notes (Signed)
Eric [name] completed another cycle of FOLFOX on 04/03/2021.  No nausea/vomiting or mouth sores following chemotherapy.  He has mild intermittent cold sensitivity.  No peripheral numbness at present.    Objective:  Vital signs in last 24 hours:  Blood pressure 114/85, pulse 91, temperature 97.8 F (36.6 C), temperature source Oral, resp. rate 18, height _0  (1.905 m), weight 149 lb 6.4 oz (67.8 kg), SpO2 96 %.    HEENT: No thrush or ulcers Resp: Lungs clear bilaterally Cardio: Regular rate and rhythm GI: No hepatomegaly, right abdomen ostomy Vascular: No leg edema Neuro: Mild loss of vibratory sense at the fingertips Skin: Dryness and skin thickening of the palms  Portacath/PICC-without erythema  Lab Results:  Lab Results  Component Value Date   WBC 4.0 04/15/2021   HGB 9.8 (L) 04/15/2021   HCT 29.9 (L) 04/15/2021   MCV 84.5 04/15/2021   PLT 111 (L) 04/15/2021   NEUTROABS 2.0 04/15/2021    CMP  Lab Results  Component Value Date   NA 138 04/15/2021   K 3.5 04/15/2021   CL 105 04/15/2021   CO2 24 04/15/2021   GLUCOSE 139 (H) 04/15/2021   BUN 15 04/15/2021   CREATININE 1.00 04/15/2021   CALCIUM 9.2 04/15/2021   PROT 6.8 04/15/2021   ALBUMIN 3.8 04/15/2021   AST 30 04/15/2021   ALT 25 04/15/2021   ALKPHOS 128 (H) 04/15/2021   BILITOT 0.6 04/15/2021   GFRNONAA >60 04/15/2021    Lab Results  Component Value Date   CEA1 0.8 11/19/2020     Medications: I have reviewed the patient's current medications.   Assessment/Plan: Stage IV adenocarcinoma of the colon -11/15/2020 CT abdomen/pelvis with contrast-long segment masslike mural thickening of the cecum, posterior perforation at the site of the cecal wall thickening with multilocular right retroperitoneal abscess, indeterminate hypodensity in the inferior right lobe of the liver. -11/19/2020 CEA 0.8 -11/20/2020 MRI of  the abdomen and pelvis with and without contrast-3.5 x 2.5 cm heterogeneous enhancing lesion in the inferior peripheral right liver with 2 smaller lesions with similar features. -11/20/2020 ultrasound-guided liver biopsy consistent with adenocarcinoma.  Foundation 1-MSS, tumor mutation burden 8,KRAS wt -11/25/2020 CT abdomen/pelvis with contrast-interval placement of a right lower quadrant percutaneous pigtail drainage catheter with near resolution of the retroperitoneal fluid collection, redemonstrated large fungating mass at the cecal base measuring 11.9 x 9.1 x 8.4 cm with enlarged lymph nodes in the right lower quadrant mesocolon. -11/28/2020 exploratory laparotomy, right colectomy, ileostomy creation, cystoscopy with stent placement             -Surgical pathology showed adenocarcinoma, moderately differentiated, 12 cm, 0/14+ lymph nodes, lymphovascular space invasion present, radial margin involved by invasive adenocarcinoma, appendix with intraluminal             adenocarcinoma, pT4, PN 0 -12/09/2020 CT abdomen/pelvis with contrast-interval right colectomy and right lower quadrant ileostomy, stable right retroperitoneal soft tissue density along the psoas and iliacus muscles, mild progression of liver metastases since prior study. -Cycle 1 FOLFOX 12/24/2020 -Cycle 2 FOLFOX 01/06/2021 -Cycle 3 FOLFOX 01/20/2021 -Cycle 4 FOLFOX 02/03/2021 -Cycle 5 FOLFOX 02/17/2021 -Cycle 6 FOLFOX 03/03/2021 -Restaging CTs 03/05/2021- substantial interval decrease in size of predominantly hypodense lesions throughout the liver.  Significant interval resolution of previously seen extensive, heterogeneous soft tissue in the right retroperitoneal soft tissues and paracolic gutter overlying the psoas and iliacus muscles. -Cycle 7 FOLFOX  03/19/2021 -Cycle 8 FOLFOX 04/03/2021, oxaliplatin dose reduced secondary to neutropenia and thrombocytopenia -Cycle 9 FOLFOX 04/15/2021 2.  Sepsis secondary to perforated colon 3.  Retroperitoneal  abscess 4.  Protein calorie malnutrition 5.  Thrombocytosis 6.  Iron deficiency anemia 7.  COVID-19 infection, 11/15/2020 8.  Tobacco abuse  9.  Family history of colon cancer 10.  Port-A-Cath placement, Dr. [name] 12/12/2020     Disposition: Eric [name] appears stable.  He will complete another cycle of FOLFOX today.  He will return for an office visit and chemotherapy in 2 weeks.  [name], MD  04/15/2021  9:52 AM

## 2021-04-15 NOTE — Patient Instructions (Signed)
[facility]  Discharge Instructions: Thank you for choosing [facility] to provide your oncology and hematology care.   If you have a lab appointment with the [facility], please go directly to the [facility] and check in at the registration area.   Wear comfortable clothing and clothing appropriate for easy access to any Portacath or PICC line.   We strive to give you quality time with your provider. You may need to reschedule your appointment if you arrive late (15 or more minutes).  Arriving late affects you and other patients whose appointments are after yours.  Also, if you miss three or more appointments without notifying the office, you may be dismissed from the clinic at the provider's discretion.      For prescription refill requests, have your pharmacy contact our office and allow 72 hours for refills to be completed.    Today you received the following chemotherapy and/or immunotherapy agents oxaliplatin, leucovorin, fluorouracil     To help prevent nausea and vomiting after your treatment, we encourage you to take your nausea medication as directed.  BELOW ARE SYMPTOMS THAT SHOULD BE REPORTED IMMEDIATELY: *FEVER GREATER THAN 100.4 F (38 C) OR HIGHER *CHILLS OR SWEATING *NAUSEA AND VOMITING THAT IS NOT CONTROLLED WITH YOUR NAUSEA MEDICATION *UNUSUAL SHORTNESS OF BREATH *UNUSUAL BRUISING OR BLEEDING *URINARY PROBLEMS (pain or burning when urinating, or frequent urination) *BOWEL PROBLEMS (unusual diarrhea, constipation, pain near the anus) TENDERNESS IN MOUTH AND THROAT WITH OR WITHOUT PRESENCE OF ULCERS (sore throat, sores in mouth, or a toothache) UNUSUAL RASH, SWELLING OR PAIN  UNUSUAL VAGINAL DISCHARGE OR ITCHING   Items with * indicate a potential emergency and should be followed up as soon as possible or go to the Emergency Department if any problems should occur.  Please show the CHEMOTHERAPY ALERT CARD or IMMUNOTHERAPY  ALERT CARD at check-in to the Emergency Department and triage nurse.  Should you have questions after your visit or need to cancel or reschedule your appointment, please contact [facility]  Dept: [phone]  and follow the prompts.  Office hours are 8:00 a.m. to 4:30 p.m. Monday - Friday. Please note that voicemails left after 4:00 p.m. may not be returned until the following business day.  We are closed weekends and major holidays. You have access to a nurse at all times for urgent questions. Please call the main number to the clinic Dept: [phone] and follow the prompts.   For any non-urgent questions, you may also contact your provider using MyChart. We now offer e-Visits for anyone 6 and older to request care online for non-urgent symptoms. For details visit [email or web address].   Also download the MyChart app! Go to the app store, search "MyChart", open the app, select [facility], and log in with your MyChart username and password.  Oxaliplatin Injection What is this medication? OXALIPLATIN (ox AL i PLA tin) is a chemotherapy drug. It targets fast dividing cells, like cancer cells, and causes these cells to die. This medicine is usedto treat cancers of the colon and rectum, and many other cancers. This medicine may be used for other purposes; ask your health care provider orpharmacist if you have questions. COMMON BRAND NAME(S): Eloxatin What should I tell my care team before I take this medication? They need to know if you have any of these conditions: heart disease history of irregular heartbeat liver disease low blood counts, like white cells, platelets, or red blood cells  lung or breathing disease, like asthma take medicines that treat or prevent blood clots tingling of the fingers or toes, or other nerve disorder an unusual or allergic reaction to oxaliplatin, other chemotherapy, other medicines, foods, dyes, or preservatives pregnant or  trying to get pregnant breast-feeding How should I use this medication? This drug is given as an infusion into a vein. It is administered in a hospitalor clinic by a specially trained health care professional. Talk to your pediatrician regarding the use of this medicine in children.Special care may be needed. Overdosage: If you think you have taken too much of this medicine contact apoison control center or emergency room at once. NOTE: This medicine is only for you. Do not share this medicine with others. What if I miss a dose? It is important not to miss a dose. Call your doctor or health careprofessional if you are unable to keep an appointment. What may interact with this medication? Do not take this medicine with any of the following medications: cisapride dronedarone pimozide thioridazine This medicine may also interact with the following medications: aspirin and aspirin-like medicines certain medicines that treat or prevent blood clots like warfarin, apixaban, dabigatran, and rivaroxaban cisplatin cyclosporine diuretics medicines for infection like acyclovir, adefovir, amphotericin B, bacitracin, cidofovir, foscarnet, ganciclovir, gentamicin, pentamidine, vancomycin NSAIDs, medicines for pain and inflammation, like ibuprofen or naproxen other medicines that prolong the QT interval (an abnormal heart rhythm) pamidronate zoledronic acid This list may not describe all possible interactions. Give your health care provider a list of all the medicines, herbs, non-prescription drugs, or dietary supplements you use. Also tell them if you smoke, drink alcohol, or use illegaldrugs. Some items may interact with your medicine. What should I watch for while using this medication? Your condition will be monitored carefully while you are receiving thismedicine. You may need blood work done while you are taking this medicine. This medicine may make you feel generally unwell. This is not uncommon  as chemotherapy can affect healthy cells as well as cancer cells. Report any side effects. Continue your course of treatment even though you feel ill unless yourhealthcare professional tells you to stop. This medicine can make you more sensitive to cold. Do not drink cold drinks or use ice. Cover exposed skin before coming in contact with cold temperatures or cold objects. When out in cold weather wear warm clothing and cover your mouth and nose to warm the air that goes into your lungs. Tell your doctor if you getsensitive to the cold. Do not become pregnant while taking this medicine or for 9 months after stopping it. Women should inform their health care professional if they wish to become pregnant or think they might be pregnant. Men should not father a child while taking this medicine and for 6 months after stopping it. There is potential for serious side effects to an unborn child. Talk to your health careprofessional for more information. Do not breast-feed a child while taking this medicine or for 3 months afterstopping it. This medicine has caused ovarian failure in some women. This medicine may make it more difficult to get pregnant. Talk to your health care professional if [place] concerned about your fertility. This medicine has caused decreased sperm counts in some men. This may make it more difficult to father a child. Talk to your health care professional if [place] concerned about your fertility. This medicine may increase your risk of getting an infection. Call your health care professional for advice if you get a fever,  chills, or sore throat, or other symptoms of a cold or flu. Do not treat yourself. Try to avoid beingaround people who are sick. Avoid taking medicines that contain aspirin, acetaminophen, ibuprofen, naproxen, or ketoprofen unless instructed by your health care professional.These medicines may hide a fever. Be careful brushing or flossing your teeth or using a toothpick  because you may get an infection or bleed more easily. If you have any dental work done, Primary school teacher you are receiving this medicine. What side effects may I notice from receiving this medication? Side effects that you should report to your doctor or health care professionalas soon as possible: allergic reactions like skin rash, itching or hives, swelling of the face, lips, or tongue breathing problems cough low blood counts - this medicine may decrease the number of white blood cells, red blood cells, and platelets. You may be at increased risk for infections and bleeding nausea, vomiting pain, redness, or irritation at site where injected pain, tingling, numbness in the hands or feet signs and symptoms of bleeding such as bloody or black, tarry stools; red or dark brown urine; spitting up blood or brown material that looks like coffee grounds; red spots on the skin; unusual bruising or bleeding from the eyes, gums, or nose signs and symptoms of a dangerous change in heartbeat or heart rhythm like chest pain; dizziness; fast, irregular heartbeat; palpitations; feeling faint or lightheaded; falls signs and symptoms of infection like fever; chills; cough; sore throat; pain or trouble passing urine signs and symptoms of liver injury like dark yellow or brown urine; general ill feeling or flu-like symptoms; light-colored stools; loss of appetite; nausea; right upper belly pain; unusually weak or tired; yellowing of the eyes or skin signs and symptoms of low red blood cells or anemia such as unusually weak or tired; feeling faint or lightheaded; falls signs and symptoms of muscle injury like dark urine; trouble passing urine or change in the amount of urine; unusually weak or tired; muscle pain; back pain Side effects that usually do not require medical attention (report to yourdoctor or health care professional if they continue or are bothersome): changes in taste diarrhea gas hair loss loss  of appetite mouth sores This list may not describe all possible side effects. Call your doctor for medical advice about side effects. You may report side effects to FDA at1-800-FDA-1088. Where should I keep my medication? This drug is given in a hospital or clinic and will not be stored at home. NOTE: This sheet is a summary. It may not cover all possible information. If you have questions about this medicine, talk to your doctor, pharmacist, orhealth care provider.  2022 Elsevier/Gold Standard (2019-02-15 12:20:35)   Due to Covid, a mask is required upon entering the hospital/clinic. If you do not have a mask, one will be given to you upon arrival. For doctor visits, patients may have 1 support person aged 54 or older with them. For treatment visits, patients cannot have anyone with them due to current Covid guidelines and our immunocompromised population.   Leucovorin injection What is this medication? LEUCOVORIN (loo koe VOR in) is used to prevent or treat the harmful effects of some medicines. This medicine is used to treat anemia caused by a low amount of folic acid in the body. It is also used with 5-fluorouracil (5-FU) to treatcolon cancer. This medicine may be used for other purposes; ask your health care provider orpharmacist if you have questions. What should I tell my care team  before I take this medication? They need to know if you have any of these conditions: anemia from low levels of vitamin B-12 in the blood an unusual or allergic reaction to leucovorin, folic acid, other medicines, foods, dyes, or preservatives pregnant or trying to get pregnant breast-feeding How should I use this medication? This medicine is for injection into a muscle or into a vein. It is given by ahealth care professional in a hospital or clinic setting. Talk to your pediatrician regarding the use of this medicine in children.Special care may be needed. Overdosage: If you think you have taken too much  of this medicine contact apoison control center or emergency room at once. NOTE: This medicine is only for you. Do not share this medicine with others. What if I miss a dose? This does not apply. What may interact with this medication? capecitabine fluorouracil phenobarbital phenytoin primidone trimethoprim-sulfamethoxazole This list may not describe all possible interactions. Give your health care provider a list of all the medicines, herbs, non-prescription drugs, or dietary supplements you use. Also tell them if you smoke, drink alcohol, or use illegaldrugs. Some items may interact with your medicine. What should I watch for while using this medication? Your condition will be monitored carefully while you are receiving thismedicine. This medicine may increase the side effects of 5-fluorouracil, 5-FU. Tell your doctor or health care professional if you have diarrhea or mouth sores that donot get better or that get worse. What side effects may I notice from receiving this medication? Side effects that you should report to your doctor or health care professionalas soon as possible: allergic reactions like skin rash, itching or hives, swelling of the face, lips, or tongue breathing problems fever, infection mouth sores unusual bleeding or bruising unusually weak or tired Side effects that usually do not require medical attention (report to yourdoctor or health care professional if they continue or are bothersome): constipation or diarrhea loss of appetite nausea, vomiting This list may not describe all possible side effects. Call your doctor for medical advice about side effects. You may report side effects to FDA at1-800-FDA-1088. Where should I keep my medication? This drug is given in a hospital or clinic and will not be stored at home. NOTE: This sheet is a summary. It may not cover all possible information. If you have questions about this medicine, talk to your doctor, pharmacist,  orhealth care provider.  2022 Elsevier/Gold Standard (2008-04-03 16:50:29)  Fluorouracil, 5-FU injection What is this medication? FLUOROURACIL, 5-FU (flure oh YOOR a sil) is a chemotherapy drug. It slows the growth of cancer cells. This medicine is used to treat many types of cancer like breast cancer, colon or rectal cancer, pancreatic cancer, and stomachcancer. This medicine may be used for other purposes; ask your health care provider orpharmacist if you have questions. COMMON BRAND NAME(S): Adrucil What should I tell my care team before I take this medication? They need to know if you have any of these conditions: blood disorders dihydropyrimidine dehydrogenase (DPD) deficiency infection (especially a virus infection such as chickenpox, cold sores, or herpes) kidney disease liver disease malnourished, poor nutrition recent or ongoing radiation therapy an unusual or allergic reaction to fluorouracil, other chemotherapy, other medicines, foods, dyes, or preservatives pregnant or trying to get pregnant breast-feeding How should I use this medication? This drug is given as an infusion or injection into a vein. It is administeredin a hospital or clinic by a specially trained health care professional. Talk to your pediatrician regarding the  use of this medicine in children.Special care may be needed. Overdosage: If you think you have taken too much of this medicine contact apoison control center or emergency room at once. NOTE: This medicine is only for you. Do not share this medicine with others. What if I miss a dose? It is important not to miss your dose. Call your doctor or health careprofessional if you are unable to keep an appointment. What may interact with this medication? Do not take this medicine with any of the following medications: live virus vaccines This medicine may also interact with the following medications: medicines that treat or prevent blood clots like warfarin,  enoxaparin, and dalteparin This list may not describe all possible interactions. Give your health care provider a list of all the medicines, herbs, non-prescription drugs, or dietary supplements you use. Also tell them if you smoke, drink alcohol, or use illegaldrugs. Some items may interact with your medicine. What should I watch for while using this medication? Visit your doctor for checks on your progress. This drug may make you feel generally unwell. This is not uncommon, as chemotherapy can affect healthy cells as well as cancer cells. Report any side effects. Continue your course oftreatment even though you feel ill unless your doctor tells you to stop. In some cases, you may be given additional medicines to help with side effects.Follow all directions for their use. Call your doctor or health care professional for advice if you get a fever, chills or sore throat, or other symptoms of a cold or flu. Do not treat yourself. This drug decreases your body's ability to fight infections. Try toavoid being around people who are sick. This medicine may increase your risk to bruise or bleed. Call your doctor orhealth care professional if you notice any unusual bleeding. Be careful brushing and flossing your teeth or using a toothpick because you may get an infection or bleed more easily. If you have any dental work done,tell your dentist you are receiving this medicine. Avoid taking products that contain aspirin, acetaminophen, ibuprofen, naproxen, or ketoprofen unless instructed by your doctor. These medicines may hide afever. Do not become pregnant while taking this medicine. Women should inform their doctor if they wish to become pregnant or think they might be pregnant. There is a potential for serious side effects to an unborn child. Talk to your health care professional or pharmacist for more information. Do not breast-feed aninfant while taking this medicine. Men should inform their doctor if they wish  to father a child. This medicinemay lower sperm counts. Do not treat diarrhea with over the counter products. Contact your doctor ifyou have diarrhea that lasts more than 2 days or if it is severe and watery. This medicine can make you more sensitive to the sun. Keep out of the sun. If you cannot avoid being in the sun, wear protective clothing and use sunscreen.Do not use sun lamps or tanning beds/booths. What side effects may I notice from receiving this medication? Side effects that you should report to your doctor or health care professionalas soon as possible: allergic reactions like skin rash, itching or hives, swelling of the face, lips, or tongue low blood counts - this medicine may decrease the number of white blood cells, red blood cells and platelets. You may be at increased risk for infections and bleeding. signs of infection - fever or chills, cough, sore throat, pain or difficulty passing urine signs of decreased platelets or bleeding - bruising, pinpoint red spots on the  skin, black, tarry stools, blood in the urine signs of decreased red blood cells - unusually weak or tired, fainting spells, lightheadedness breathing problems changes in vision chest pain mouth sores nausea and vomiting pain, swelling, redness at site where injected pain, tingling, numbness in the hands or feet redness, swelling, or sores on hands or feet stomach pain unusual bleeding Side effects that usually do not require medical attention (report to yourdoctor or health care professional if they continue or are bothersome): changes in finger or toe nails diarrhea dry or itchy skin hair loss headache loss of appetite sensitivity of eyes to the light stomach upset unusually teary eyes This list may not describe all possible side effects. Call your doctor for medical advice about side effects. You may report side effects to FDA at1-800-FDA-1088. Where should I keep my medication? This drug is given in  a hospital or clinic and will not be stored at home. NOTE: This sheet is a summary. It may not cover all possible information. If you have questions about this medicine, talk to your doctor, pharmacist, orhealth care provider.  2022 Elsevier/Gold Standard (2019-08-29 15:00:03)

## 2021-04-17 ENCOUNTER — Inpatient Hospital Stay: Payer: Self-pay

## 2021-04-17 ENCOUNTER — Other Ambulatory Visit: Payer: Self-pay

## 2021-04-17 VITALS — BP 105/73 | HR 95 | Temp 98.4°F | Resp 18

## 2021-04-17 DIAGNOSIS — C787 Secondary malignant neoplasm of liver and intrahepatic bile duct: Secondary | ICD-10-CM

## 2021-04-17 DIAGNOSIS — K631 Perforation of intestine (nontraumatic): Secondary | ICD-10-CM

## 2021-04-17 DIAGNOSIS — C189 Malignant neoplasm of colon, unspecified: Secondary | ICD-10-CM

## 2021-04-17 MED ORDER — HEPARIN SOD (PORK) LOCK FLUSH 100 UNIT/ML IV SOLN
500.0000 [IU] | Freq: Once | INTRAVENOUS | Status: AC | PRN
  Administered 2021-04-17: 500 [IU]
  Filled 2021-04-17: qty 5

## 2021-04-17 MED ORDER — SODIUM CHLORIDE 0.9% FLUSH
10.0000 mL | INTRAVENOUS | Status: DC | PRN
  Administered 2021-04-17: 10 mL
  Filled 2021-04-17: qty 10

## 2021-04-27 ENCOUNTER — Other Ambulatory Visit: Payer: Self-pay | Admitting: Oncology

## 2021-04-29 ENCOUNTER — Inpatient Hospital Stay: Payer: Self-pay

## 2021-04-29 ENCOUNTER — Inpatient Hospital Stay (HOSPITAL_BASED_OUTPATIENT_CLINIC_OR_DEPARTMENT_OTHER): Payer: Self-pay | Admitting: Nurse Practitioner

## 2021-04-29 ENCOUNTER — Other Ambulatory Visit: Payer: Self-pay

## 2021-04-29 ENCOUNTER — Encounter: Payer: Self-pay | Admitting: Nurse Practitioner

## 2021-04-29 ENCOUNTER — Encounter: Payer: Self-pay | Admitting: Oncology

## 2021-04-29 ENCOUNTER — Other Ambulatory Visit (HOSPITAL_COMMUNITY): Payer: Self-pay

## 2021-04-29 VITALS — BP 119/82 | HR 84 | Temp 97.4°F | Resp 18 | Wt 147.6 lb

## 2021-04-29 DIAGNOSIS — C189 Malignant neoplasm of colon, unspecified: Secondary | ICD-10-CM

## 2021-04-29 DIAGNOSIS — K631 Perforation of intestine (nontraumatic): Secondary | ICD-10-CM

## 2021-04-29 DIAGNOSIS — C787 Secondary malignant neoplasm of liver and intrahepatic bile duct: Secondary | ICD-10-CM

## 2021-04-29 LAB — CMP (CANCER CENTER ONLY)
ALT: 16 U/L (ref 0–44)
AST: 24 U/L (ref 15–41)
Albumin: 3.5 g/dL (ref 3.5–5.0)
Alkaline Phosphatase: 142 U/L — ABNORMAL HIGH (ref 38–126)
Anion gap: 9 (ref 5–15)
BUN: 10 mg/dL (ref 6–20)
CO2: 24 mmol/L (ref 22–32)
Calcium: 8.8 mg/dL — ABNORMAL LOW (ref 8.9–10.3)
Chloride: 103 mmol/L (ref 98–111)
Creatinine: 0.89 mg/dL (ref 0.61–1.24)
GFR, Estimated: >60 mL/min (ref >60)
Glucose, Bld: 109 mg/dL — ABNORMAL HIGH (ref 70–99)
Potassium: 3.4 mmol/L — ABNORMAL LOW (ref 3.5–5.1)
Sodium: 136 mmol/L (ref 135–145)
Total Bilirubin: 0.7 mg/dL (ref 0.3–1.2)
Total Protein: 6.9 g/dL (ref 6.5–8.1)

## 2021-04-29 LAB — CBC WITH DIFFERENTIAL (CANCER CENTER ONLY)
Abs Immature Granulocytes: 0.02 10*3/uL (ref 0.00–0.07)
Basophils Absolute: 0.1 10*3/uL (ref 0.0–0.1)
Basophils Relative: 1 %
Eosinophils Absolute: 0.1 10*3/uL (ref 0.0–0.5)
Eosinophils Relative: 4 %
HCT: 29.7 % — ABNORMAL LOW (ref 39.0–52.0)
Hemoglobin: 9.7 g/dL — ABNORMAL LOW (ref 13.0–17.0)
Immature Granulocytes: 1 %
Lymphocytes Relative: 31 %
Lymphs Abs: 1.2 10*3/uL (ref 0.7–4.0)
MCH: 27.9 pg (ref 26.0–34.0)
MCHC: 32.7 g/dL (ref 30.0–36.0)
MCV: 85.3 fL (ref 80.0–100.0)
Monocytes Absolute: 0.8 10*3/uL (ref 0.1–1.0)
Monocytes Relative: 19 %
Neutro Abs: 1.7 10*3/uL (ref 1.7–7.7)
Neutrophils Relative %: 44 %
Platelet Count: 143 10*3/uL — ABNORMAL LOW (ref 150–400)
RBC: 3.48 MIL/uL — ABNORMAL LOW (ref 4.22–5.81)
RDW: 21.8 % — ABNORMAL HIGH (ref 11.5–15.5)
WBC Count: 3.9 10*3/uL — ABNORMAL LOW (ref 4.0–10.5)
nRBC: 0 % (ref 0.0–0.2)

## 2021-04-29 MED ORDER — DEXTROSE 5 % IV SOLN
Freq: Once | INTRAVENOUS | Status: AC
  Filled 2021-04-29: qty 250

## 2021-04-29 MED ORDER — SODIUM CHLORIDE 0.9% FLUSH
10.0000 mL | INTRAVENOUS | Status: DC | PRN
Start: 2021-04-29 — End: 2021-04-29
  Filled 2021-04-29: qty 10

## 2021-04-29 MED ORDER — OXYCODONE HCL 10 MG PO TABS
5.0000 mg | ORAL_TABLET | Freq: Four times a day (QID) | ORAL | 0 refills | Status: DC | PRN
  Filled 2021-04-29: qty 25, 6d supply, fill #0

## 2021-04-29 MED ORDER — FLUOROURACIL CHEMO INJECTION 2.5 GM/50ML
400.0000 mg/m2 | Freq: Once | INTRAVENOUS | Status: AC
  Administered 2021-04-29: 750 mg via INTRAVENOUS
  Filled 2021-04-29: qty 15

## 2021-04-29 MED ORDER — LEUCOVORIN CALCIUM INJECTION 350 MG
400.0000 mg/m2 | Freq: Once | INTRAVENOUS | Status: AC
  Administered 2021-04-29: 744 mg via INTRAVENOUS
  Filled 2021-04-29: qty 37.2

## 2021-04-29 MED ORDER — SODIUM CHLORIDE 0.9 % IV SOLN
10.0000 mg | Freq: Once | INTRAVENOUS | Status: AC
  Administered 2021-04-29: 10 mg via INTRAVENOUS
  Filled 2021-04-29: qty 10

## 2021-04-29 MED ORDER — ONDANSETRON HCL 8 MG PO TABS
ORAL_TABLET | ORAL | 1 refills | Status: DC
  Filled 2021-04-29: qty 30, 10d supply, fill #0

## 2021-04-29 MED ORDER — SODIUM CHLORIDE 0.9 % IV SOLN
2400.0000 mg/m2 | INTRAVENOUS | Status: DC
  Administered 2021-04-29: 4450 mg via INTRAVENOUS
  Filled 2021-04-29: qty 89

## 2021-04-29 MED ORDER — PALONOSETRON HCL INJECTION 0.25 MG/5ML
0.2500 mg | Freq: Once | INTRAVENOUS | Status: AC
  Administered 2021-04-29: 0.25 mg via INTRAVENOUS
  Filled 2021-04-29: qty 5

## 2021-04-29 MED ORDER — OXALIPLATIN CHEMO INJECTION 100 MG/20ML
65.0000 mg/m2 | Freq: Once | INTRAVENOUS | Status: AC
  Administered 2021-04-29: 120 mg via INTRAVENOUS
  Filled 2021-04-29: qty 24

## 2021-04-29 MED ORDER — PROCHLORPERAZINE MALEATE 10 MG PO TABS
10.0000 mg | ORAL_TABLET | Freq: Four times a day (QID) | ORAL | 1 refills | Status: DC | PRN
Start: 2021-04-29 — End: 2021-06-10
  Filled 2021-04-29: qty 60, 15d supply, fill #0

## 2021-04-29 NOTE — Patient Instructions (Signed)
Implanted Port Home Guide An implanted port is a device that is placed under the skin. It is usually placed in the chest. The device can be used to give IV medicine, to take blood, or for dialysis. You may have an implanted port if: You need IV medicine that would be irritating to the small veins in your hands or arms. You need IV medicines, such as antibiotics, for a long period of time. You need IV nutrition for a long period of time. You need dialysis. When you have a port, your health care provider can choose to use the port instead of veins in your arms for these procedures. You may have fewer limitations when using a port than you would if you used other types of long-term IVs, and you will likely be able to return to normal activities afteryour incision heals. An implanted port has two main parts: Reservoir. The reservoir is the part where a needle is inserted to give medicines or draw blood. The reservoir is round. After it is placed, it appears as a small, raised area under your skin. Catheter. The catheter is a thin, flexible tube that connects the reservoir to a vein. Medicine that is inserted into the reservoir goes into the catheter and then into the vein. How is my port accessed? To access your port: A numbing cream may be placed on the skin over the port site. Your health care provider will put on a mask and sterile gloves. The skin over your port will be cleaned carefully with a germ-killing soap and allowed to dry. Your health care provider will gently pinch the port and insert a needle into it. Your health care provider will check for a blood return to make sure the port is in the vein and is not clogged. If your port needs to remain accessed to get medicine continuously (constant infusion), your health care provider will place a clear bandage (dressing) over the needle site. The dressing and needle will need to be changed every week, or as told by your health care provider. What  is flushing? Flushing helps keep the port from getting clogged. Follow instructions from your health care provider about how and when to flush the port. Ports are usually flushed with saline solution or a medicine called heparin. The need for flushing will depend on how the port is used: If the port is only used from time to time to give medicines or draw blood, the port may need to be flushed: Before and after medicines have been given. Before and after blood has been drawn. As part of routine maintenance. Flushing may be recommended every 4-6 weeks. If a constant infusion is running, the port may not need to be flushed. Throw away any syringes in a disposal container that is meant for sharp items (sharps container). You can buy a sharps container from a pharmacy, or you can make one by using an empty hard plastic bottle with a cover. How long will my port stay implanted? The port can stay in for as long as your health care provider thinks it is needed. When it is time for the port to come out, a surgery will be done to remove it. The surgery will be similar to the procedure that was done to putthe port in. Follow these instructions at home:  Flush your port as told by your health care provider. If you need an infusion over several days, follow instructions from your health care provider about how to take   care of your port site. Make sure you: Wash your hands with soap and water before you change your dressing. If soap and water are not available, use alcohol-based hand sanitizer. Change your dressing as told by your health care provider. Place any used dressings or infusion bags into a plastic bag. Throw that bag in the trash. Keep the dressing that covers the needle clean and dry. Do not get it wet. Do not use scissors or sharp objects near the tube. Keep the tube clamped, unless it is being used. Check your port site every day for signs of infection. Check for: Redness, swelling, or  pain. Fluid or blood. Pus or a bad smell. Protect the skin around the port site. Avoid wearing bra straps that rub or irritate the site. Protect the skin around your port from seat belts. Place a soft pad over your chest if needed. Bathe or shower as told by your health care provider. The site may get wet as long as you are not actively receiving an infusion. Return to your normal activities as told by your health care provider. Ask your health care provider what activities are safe for you. Carry a medical alert card or wear a medical alert bracelet at all times. This will let health care providers know that you have an implanted port in case of an emergency. Get help right away if: You have redness, swelling, or pain at the port site. You have fluid or blood coming from your port site. You have pus or a bad smell coming from the port site. You have a fever. Summary Implanted ports are usually placed in the chest for long-term IV access. Follow instructions from your health care provider about flushing the port and changing bandages (dressings). Take care of the area around your port by avoiding clothing that puts pressure on the area, and by watching for signs of infection. Protect the skin around your port from seat belts. Place a soft pad over your chest if needed. Get help right away if you have a fever or you have redness, swelling, pain, drainage, or a bad smell at the port site. This information is not intended to replace advice given to you by your health care provider. Make sure you discuss any questions you have with your healthcare provider. Document Revised: 02/12/2020 Document Reviewed: 02/12/2020 [employer] Patient Education  2022 [employer]  

## 2021-04-29 NOTE — Patient Instructions (Signed)
[facility]  Discharge Instructions: Thank you for choosing [facility] to provide your oncology and hematology care.   If you have a lab appointment with the [facility], please go directly to the [facility] and check in at the registration area.   Wear comfortable clothing and clothing appropriate for easy access to any Portacath or PICC line.   We strive to give you quality time with your provider. You may need to reschedule your appointment if you arrive late (15 or more minutes).  Arriving late affects you and other patients whose appointments are after yours.  Also, if you miss three or more appointments without notifying the office, you may be dismissed from the clinic at the provider's discretion.      For prescription refill requests, have your pharmacy contact our office and allow 72 hours for refills to be completed.    Today you received the following chemotherapy and/or immunotherapy agents oxaliplatin, leucovorin, fluorouracil     To help prevent nausea and vomiting after your treatment, we encourage you to take your nausea medication as directed.  BELOW ARE SYMPTOMS THAT SHOULD BE REPORTED IMMEDIATELY: *FEVER GREATER THAN 100.4 F (38 C) OR HIGHER *CHILLS OR SWEATING *NAUSEA AND VOMITING THAT IS NOT CONTROLLED WITH YOUR NAUSEA MEDICATION *UNUSUAL SHORTNESS OF BREATH *UNUSUAL BRUISING OR BLEEDING *URINARY PROBLEMS (pain or burning when urinating, or frequent urination) *BOWEL PROBLEMS (unusual diarrhea, constipation, pain near the anus) TENDERNESS IN MOUTH AND THROAT WITH OR WITHOUT PRESENCE OF ULCERS (sore throat, sores in mouth, or a toothache) UNUSUAL RASH, SWELLING OR PAIN  UNUSUAL VAGINAL DISCHARGE OR ITCHING   Items with * indicate a potential emergency and should be followed up as soon as possible or go to the Emergency Department if any problems should occur.  Please show the CHEMOTHERAPY ALERT CARD or IMMUNOTHERAPY  ALERT CARD at check-in to the Emergency Department and triage nurse.  Should you have questions after your visit or need to cancel or reschedule your appointment, please contact [facility]  Dept: [phone]  and follow the prompts.  Office hours are 8:00 a.m. to 4:30 p.m. Monday - Friday. Please note that voicemails left after 4:00 p.m. may not be returned until the following business day.  We are closed weekends and major holidays. You have access to a nurse at all times for urgent questions. Please call the main number to the clinic Dept: [phone] and follow the prompts.   For any non-urgent questions, you may also contact your provider using MyChart. We now offer e-Visits for anyone 19 and older to request care online for non-urgent symptoms. For details visit [email or web address].   Also download the MyChart app! Go to the app store, search "MyChart", open the app, select [facility], and log in with your MyChart username and password.  Oxaliplatin Injection What is this medication? OXALIPLATIN (ox AL i PLA tin) is a chemotherapy drug. It targets fast dividing cells, like cancer cells, and causes these cells to die. This medicine is usedto treat cancers of the colon and rectum, and many other cancers. This medicine may be used for other purposes; ask your health care provider orpharmacist if you have questions. COMMON BRAND NAME(S): Eloxatin What should I tell my care team before I take this medication? They need to know if you have any of these conditions: heart disease history of irregular heartbeat liver disease low blood counts, like white cells, platelets, or red blood cells  lung or breathing disease, like asthma take medicines that treat or prevent blood clots tingling of the fingers or toes, or other nerve disorder an unusual or allergic reaction to oxaliplatin, other chemotherapy, other medicines, foods, dyes, or preservatives pregnant or  trying to get pregnant breast-feeding How should I use this medication? This drug is given as an infusion into a vein. It is administered in a hospitalor clinic by a specially trained health care professional. Talk to your pediatrician regarding the use of this medicine in children.Special care may be needed. Overdosage: If you think you have taken too much of this medicine contact apoison control center or emergency room at once. NOTE: This medicine is only for you. Do not share this medicine with others. What if I miss a dose? It is important not to miss a dose. Call your doctor or health careprofessional if you are unable to keep an appointment. What may interact with this medication? Do not take this medicine with any of the following medications: cisapride dronedarone pimozide thioridazine This medicine may also interact with the following medications: aspirin and aspirin-like medicines certain medicines that treat or prevent blood clots like warfarin, apixaban, dabigatran, and rivaroxaban cisplatin cyclosporine diuretics medicines for infection like acyclovir, adefovir, amphotericin B, bacitracin, cidofovir, foscarnet, ganciclovir, gentamicin, pentamidine, vancomycin NSAIDs, medicines for pain and inflammation, like ibuprofen or naproxen other medicines that prolong the QT interval (an abnormal heart rhythm) pamidronate zoledronic acid This list may not describe all possible interactions. Give your health care provider a list of all the medicines, herbs, non-prescription drugs, or dietary supplements you use. Also tell them if you smoke, drink alcohol, or use illegaldrugs. Some items may interact with your medicine. What should I watch for while using this medication? Your condition will be monitored carefully while you are receiving thismedicine. You may need blood work done while you are taking this medicine. This medicine may make you feel generally unwell. This is not uncommon  as chemotherapy can affect healthy cells as well as cancer cells. Report any side effects. Continue your course of treatment even though you feel ill unless yourhealthcare professional tells you to stop. This medicine can make you more sensitive to cold. Do not drink cold drinks or use ice. Cover exposed skin before coming in contact with cold temperatures or cold objects. When out in cold weather wear warm clothing and cover your mouth and nose to warm the air that goes into your lungs. Tell your doctor if you getsensitive to the cold. Do not become pregnant while taking this medicine or for 9 months after stopping it. Women should inform their health care professional if they wish to become pregnant or think they might be pregnant. Men should not father a child while taking this medicine and for 6 months after stopping it. There is potential for serious side effects to an unborn child. Talk to your health careprofessional for more information. Do not breast-feed a child while taking this medicine or for 3 months afterstopping it. This medicine has caused ovarian failure in some women. This medicine may make it more difficult to get pregnant. Talk to your health care professional if [place] concerned about your fertility. This medicine has caused decreased sperm counts in some men. This may make it more difficult to father a child. Talk to your health care professional if [place] concerned about your fertility. This medicine may increase your risk of getting an infection. Call your health care professional for advice if you get a fever,  chills, or sore throat, or other symptoms of a cold or flu. Do not treat yourself. Try to avoid beingaround people who are sick. Avoid taking medicines that contain aspirin, acetaminophen, ibuprofen, naproxen, or ketoprofen unless instructed by your health care professional.These medicines may hide a fever. Be careful brushing or flossing your teeth or using a toothpick  because you may get an infection or bleed more easily. If you have any dental work done, Primary school teacher you are receiving this medicine. What side effects may I notice from receiving this medication? Side effects that you should report to your doctor or health care professionalas soon as possible: allergic reactions like skin rash, itching or hives, swelling of the face, lips, or tongue breathing problems cough low blood counts - this medicine may decrease the number of white blood cells, red blood cells, and platelets. You may be at increased risk for infections and bleeding nausea, vomiting pain, redness, or irritation at site where injected pain, tingling, numbness in the hands or feet signs and symptoms of bleeding such as bloody or black, tarry stools; red or dark brown urine; spitting up blood or brown material that looks like coffee grounds; red spots on the skin; unusual bruising or bleeding from the eyes, gums, or nose signs and symptoms of a dangerous change in heartbeat or heart rhythm like chest pain; dizziness; fast, irregular heartbeat; palpitations; feeling faint or lightheaded; falls signs and symptoms of infection like fever; chills; cough; sore throat; pain or trouble passing urine signs and symptoms of liver injury like dark yellow or brown urine; general ill feeling or flu-like symptoms; light-colored stools; loss of appetite; nausea; right upper belly pain; unusually weak or tired; yellowing of the eyes or skin signs and symptoms of low red blood cells or anemia such as unusually weak or tired; feeling faint or lightheaded; falls signs and symptoms of muscle injury like dark urine; trouble passing urine or change in the amount of urine; unusually weak or tired; muscle pain; back pain Side effects that usually do not require medical attention (report to yourdoctor or health care professional if they continue or are bothersome): changes in taste diarrhea gas hair loss loss  of appetite mouth sores This list may not describe all possible side effects. Call your doctor for medical advice about side effects. You may report side effects to FDA at1-800-FDA-1088. Where should I keep my medication? This drug is given in a hospital or clinic and will not be stored at home. NOTE: This sheet is a summary. It may not cover all possible information. If you have questions about this medicine, talk to your doctor, pharmacist, orhealth care provider.  2022 Elsevier/Gold Standard (2019-02-15 12:20:35)   Due to Covid, a mask is required upon entering the hospital/clinic. If you do not have a mask, one will be given to you upon arrival. For doctor visits, patients may have 1 support person aged 91 or older with them. For treatment visits, patients cannot have anyone with them due to current Covid guidelines and our immunocompromised population.   Leucovorin injection What is this medication? LEUCOVORIN (loo koe VOR in) is used to prevent or treat the harmful effects of some medicines. This medicine is used to treat anemia caused by a low amount of folic acid in the body. It is also used with 5-fluorouracil (5-FU) to treatcolon cancer. This medicine may be used for other purposes; ask your health care provider orpharmacist if you have questions. What should I tell my care team  before I take this medication? They need to know if you have any of these conditions: anemia from low levels of vitamin B-12 in the blood an unusual or allergic reaction to leucovorin, folic acid, other medicines, foods, dyes, or preservatives pregnant or trying to get pregnant breast-feeding How should I use this medication? This medicine is for injection into a muscle or into a vein. It is given by ahealth care professional in a hospital or clinic setting. Talk to your pediatrician regarding the use of this medicine in children.Special care may be needed. Overdosage: If you think you have taken too much  of this medicine contact apoison control center or emergency room at once. NOTE: This medicine is only for you. Do not share this medicine with others. What if I miss a dose? This does not apply. What may interact with this medication? capecitabine fluorouracil phenobarbital phenytoin primidone trimethoprim-sulfamethoxazole This list may not describe all possible interactions. Give your health care provider a list of all the medicines, herbs, non-prescription drugs, or dietary supplements you use. Also tell them if you smoke, drink alcohol, or use illegaldrugs. Some items may interact with your medicine. What should I watch for while using this medication? Your condition will be monitored carefully while you are receiving thismedicine. This medicine may increase the side effects of 5-fluorouracil, 5-FU. Tell your doctor or health care professional if you have diarrhea or mouth sores that donot get better or that get worse. What side effects may I notice from receiving this medication? Side effects that you should report to your doctor or health care professionalas soon as possible: allergic reactions like skin rash, itching or hives, swelling of the face, lips, or tongue breathing problems fever, infection mouth sores unusual bleeding or bruising unusually weak or tired Side effects that usually do not require medical attention (report to yourdoctor or health care professional if they continue or are bothersome): constipation or diarrhea loss of appetite nausea, vomiting This list may not describe all possible side effects. Call your doctor for medical advice about side effects. You may report side effects to FDA at1-800-FDA-1088. Where should I keep my medication? This drug is given in a hospital or clinic and will not be stored at home. NOTE: This sheet is a summary. It may not cover all possible information. If you have questions about this medicine, talk to your doctor, pharmacist,  orhealth care provider.  2022 Elsevier/Gold Standard (2008-04-03 16:50:29)  Fluorouracil, 5-FU injection What is this medication? FLUOROURACIL, 5-FU (flure oh YOOR a sil) is a chemotherapy drug. It slows the growth of cancer cells. This medicine is used to treat many types of cancer like breast cancer, colon or rectal cancer, pancreatic cancer, and stomachcancer. This medicine may be used for other purposes; ask your health care provider orpharmacist if you have questions. COMMON BRAND NAME(S): Adrucil What should I tell my care team before I take this medication? They need to know if you have any of these conditions: blood disorders dihydropyrimidine dehydrogenase (DPD) deficiency infection (especially a virus infection such as chickenpox, cold sores, or herpes) kidney disease liver disease malnourished, poor nutrition recent or ongoing radiation therapy an unusual or allergic reaction to fluorouracil, other chemotherapy, other medicines, foods, dyes, or preservatives pregnant or trying to get pregnant breast-feeding How should I use this medication? This drug is given as an infusion or injection into a vein. It is administeredin a hospital or clinic by a specially trained health care professional. Talk to your pediatrician regarding the  use of this medicine in children.Special care may be needed. Overdosage: If you think you have taken too much of this medicine contact apoison control center or emergency room at once. NOTE: This medicine is only for you. Do not share this medicine with others. What if I miss a dose? It is important not to miss your dose. Call your doctor or health careprofessional if you are unable to keep an appointment. What may interact with this medication? Do not take this medicine with any of the following medications: live virus vaccines This medicine may also interact with the following medications: medicines that treat or prevent blood clots like warfarin,  enoxaparin, and dalteparin This list may not describe all possible interactions. Give your health care provider a list of all the medicines, herbs, non-prescription drugs, or dietary supplements you use. Also tell them if you smoke, drink alcohol, or use illegaldrugs. Some items may interact with your medicine. What should I watch for while using this medication? Visit your doctor for checks on your progress. This drug may make you feel generally unwell. This is not uncommon, as chemotherapy can affect healthy cells as well as cancer cells. Report any side effects. Continue your course oftreatment even though you feel ill unless your doctor tells you to stop. In some cases, you may be given additional medicines to help with side effects.Follow all directions for their use. Call your doctor or health care professional for advice if you get a fever, chills or sore throat, or other symptoms of a cold or flu. Do not treat yourself. This drug decreases your body's ability to fight infections. Try toavoid being around people who are sick. This medicine may increase your risk to bruise or bleed. Call your doctor orhealth care professional if you notice any unusual bleeding. Be careful brushing and flossing your teeth or using a toothpick because you may get an infection or bleed more easily. If you have any dental work done,tell your dentist you are receiving this medicine. Avoid taking products that contain aspirin, acetaminophen, ibuprofen, naproxen, or ketoprofen unless instructed by your doctor. These medicines may hide afever. Do not become pregnant while taking this medicine. Women should inform their doctor if they wish to become pregnant or think they might be pregnant. There is a potential for serious side effects to an unborn child. Talk to your health care professional or pharmacist for more information. Do not breast-feed aninfant while taking this medicine. Men should inform their doctor if they wish  to father a child. This medicinemay lower sperm counts. Do not treat diarrhea with over the counter products. Contact your doctor ifyou have diarrhea that lasts more than 2 days or if it is severe and watery. This medicine can make you more sensitive to the sun. Keep out of the sun. If you cannot avoid being in the sun, wear protective clothing and use sunscreen.Do not use sun lamps or tanning beds/booths. What side effects may I notice from receiving this medication? Side effects that you should report to your doctor or health care professionalas soon as possible: allergic reactions like skin rash, itching or hives, swelling of the face, lips, or tongue low blood counts - this medicine may decrease the number of white blood cells, red blood cells and platelets. You may be at increased risk for infections and bleeding. signs of infection - fever or chills, cough, sore throat, pain or difficulty passing urine signs of decreased platelets or bleeding - bruising, pinpoint red spots on the  skin, black, tarry stools, blood in the urine signs of decreased red blood cells - unusually weak or tired, fainting spells, lightheadedness breathing problems changes in vision chest pain mouth sores nausea and vomiting pain, swelling, redness at site where injected pain, tingling, numbness in the hands or feet redness, swelling, or sores on hands or feet stomach pain unusual bleeding Side effects that usually do not require medical attention (report to yourdoctor or health care professional if they continue or are bothersome): changes in finger or toe nails diarrhea dry or itchy skin hair loss headache loss of appetite sensitivity of eyes to the light stomach upset unusually teary eyes This list may not describe all possible side effects. Call your doctor for medical advice about side effects. You may report side effects to FDA at1-800-FDA-1088. Where should I keep my medication? This drug is given in  a hospital or clinic and will not be stored at home. NOTE: This sheet is a summary. It may not cover all possible information. If you have questions about this medicine, talk to your doctor, pharmacist, orhealth care provider.  2022 Elsevier/Gold Standard (2019-08-29 15:00:03)

## 2021-04-29 NOTE — Progress Notes (Signed)
[facility] OFFICE PROGRESS NOTE   Diagnosis: Colon cancer  INTERVAL HISTORY:   Eric [name] returns as scheduled.  He completed cycle 9 FOLFOX 04/15/2021.  He reports tolerating the most recent cycle of chemotherapy well.  No nausea or vomiting.  No mouth sores.  No diarrhea.  Cold sensitivity lasted about 2-1/2 days.  He has intermittent numbness/tingling in the fingertips and toes.  No significant interference with activity.  Objective:  Vital signs in last 24 hours:  Blood pressure 119/82, pulse 84, temperature (!) 97.4 F (36.3 C), temperature source Temporal, resp. rate 18, weight 147 lb 9.6 oz (67 kg), SpO2 99 %.    HEENT: No thrush or ulcers. Resp: Lungs clear bilaterally. Cardio: Regular rate and rhythm. GI: Abdomen soft and nontender.  No hepatomegaly.  Right abdomen ostomy. Vascular: No leg edema. Neuro: Vibratory sense mildly decreased over the fingertips per tuning fork exam. Skin: Palms with skin thickening, hyperpigmentation. Port-A-Cath without erythema.   Lab Results:  Lab Results  Component Value Date   WBC 3.9 (L) 04/29/2021   HGB 9.7 (L) 04/29/2021   HCT 29.7 (L) 04/29/2021   MCV 85.3 04/29/2021   PLT 143 (L) 04/29/2021   NEUTROABS 1.7 04/29/2021    Imaging:  No results found.  Medications: I have reviewed the patient's current medications.  Assessment/Plan: Stage IV adenocarcinoma of the colon -11/15/2020 CT abdomen/pelvis with contrast-long segment masslike mural thickening of the cecum, posterior perforation at the site of the cecal wall thickening with multilocular right retroperitoneal abscess, indeterminate hypodensity in the inferior right lobe of the liver. -11/19/2020 CEA 0.8 -11/20/2020 MRI of the abdomen and pelvis with and without contrast-3.5 x 2.5 cm heterogeneous enhancing lesion in the inferior peripheral right liver with 2 smaller lesions with similar features. -11/20/2020 ultrasound-guided liver biopsy consistent with  adenocarcinoma.  Foundation 1-MSS, tumor mutation burden 8,KRAS wt -11/25/2020 CT abdomen/pelvis with contrast-interval placement of a right lower quadrant percutaneous pigtail drainage catheter with near resolution of the retroperitoneal fluid collection, redemonstrated large fungating mass at the cecal base measuring 11.9 x 9.1 x 8.4 cm with enlarged lymph nodes in the right lower quadrant mesocolon. -11/28/2020 exploratory laparotomy, right colectomy, ileostomy creation, cystoscopy with stent placement             -Surgical pathology showed adenocarcinoma, moderately differentiated, 12 cm, 0/14+ lymph nodes, lymphovascular space invasion present, radial margin involved by invasive adenocarcinoma, appendix with intraluminal             adenocarcinoma, pT4, PN 0 -12/09/2020 CT abdomen/pelvis with contrast-interval right colectomy and right lower quadrant ileostomy, stable right retroperitoneal soft tissue density along the psoas and iliacus muscles, mild progression of liver metastases since prior study. -Cycle 1 FOLFOX 12/24/2020 -Cycle 2 FOLFOX 01/06/2021 -Cycle 3 FOLFOX 01/20/2021 -Cycle 4 FOLFOX 02/03/2021 -Cycle 5 FOLFOX 02/17/2021 -Cycle 6 FOLFOX 03/03/2021 -Restaging CTs 03/05/2021- substantial interval decrease in size of predominantly hypodense lesions throughout the liver.  Significant interval resolution of previously seen extensive, heterogeneous soft tissue in the right retroperitoneal soft tissues and paracolic gutter overlying the psoas and iliacus muscles. -Cycle 7 FOLFOX 03/19/2021 -Cycle 8 FOLFOX 04/03/2021, oxaliplatin dose reduced secondary to neutropenia and thrombocytopenia -Cycle 9 FOLFOX 04/15/2021 -Cycle 10 FOLFOX 04/29/2021 2.  Sepsis secondary to perforated colon 3.  Retroperitoneal abscess 4.  Protein calorie malnutrition 5.  Thrombocytosis 6.  Iron deficiency anemia 7.  COVID-19 infection, 11/15/2020 8.  Tobacco abuse 9.  Family history of colon cancer 10.  Port-A-Cath placement,  Dr.  [name] 12/12/2020    Disposition: Eric [name] appears stable.  He has completed 9 cycles of FOLFOX.  He tolerated the most recent cycle well.  Plan to proceed with cycle 10 today as scheduled.  We reviewed the CBC from today.  Counts adequate to proceed with treatment.  He will return for lab, follow-up, cycle 11 FOLFOX in 2 weeks.  We are available to see him sooner if needed.    Eric [name] ANP/GNP-BC   04/29/2021  10:54 AM

## 2021-04-29 NOTE — Addendum Note (Signed)
Addended by: Owens Shark on: 04/29/2021 12:39 PM   Modules accepted: Orders

## 2021-05-01 ENCOUNTER — Inpatient Hospital Stay: Payer: Self-pay

## 2021-05-01 ENCOUNTER — Other Ambulatory Visit: Payer: Self-pay

## 2021-05-01 VITALS — BP 112/76 | HR 81 | Temp 98.7°F | Resp 18

## 2021-05-01 DIAGNOSIS — C189 Malignant neoplasm of colon, unspecified: Secondary | ICD-10-CM

## 2021-05-01 DIAGNOSIS — K631 Perforation of intestine (nontraumatic): Secondary | ICD-10-CM

## 2021-05-01 MED ORDER — SODIUM CHLORIDE 0.9% FLUSH
10.0000 mL | INTRAVENOUS | Status: DC | PRN
  Administered 2021-05-01: 10 mL
  Filled 2021-05-01: qty 10

## 2021-05-01 MED ORDER — HEPARIN SOD (PORK) LOCK FLUSH 100 UNIT/ML IV SOLN
500.0000 [IU] | Freq: Once | INTRAVENOUS | Status: AC | PRN
Start: 2021-05-01 — End: 2021-05-01
  Administered 2021-05-01: 500 [IU]
  Filled 2021-05-01: qty 5

## 2021-05-06 ENCOUNTER — Other Ambulatory Visit: Payer: Self-pay | Admitting: Oncology

## 2021-05-06 DIAGNOSIS — C787 Secondary malignant neoplasm of liver and intrahepatic bile duct: Secondary | ICD-10-CM

## 2021-05-06 DIAGNOSIS — C189 Malignant neoplasm of colon, unspecified: Secondary | ICD-10-CM

## 2021-05-09 ENCOUNTER — Other Ambulatory Visit: Payer: Self-pay | Admitting: Oncology

## 2021-05-13 ENCOUNTER — Other Ambulatory Visit: Payer: Self-pay

## 2021-05-13 ENCOUNTER — Inpatient Hospital Stay: Payer: Self-pay | Attending: Oncology

## 2021-05-13 ENCOUNTER — Inpatient Hospital Stay: Payer: Self-pay

## 2021-05-13 ENCOUNTER — Inpatient Hospital Stay (HOSPITAL_BASED_OUTPATIENT_CLINIC_OR_DEPARTMENT_OTHER): Payer: Self-pay | Admitting: Oncology

## 2021-05-13 VITALS — BP 118/86 | HR 55 | Temp 98.2°F | Resp 18 | Ht 75.0 in | Wt 143.8 lb

## 2021-05-13 DIAGNOSIS — C189 Malignant neoplasm of colon, unspecified: Secondary | ICD-10-CM

## 2021-05-13 DIAGNOSIS — K631 Perforation of intestine (nontraumatic): Secondary | ICD-10-CM

## 2021-05-13 DIAGNOSIS — C787 Secondary malignant neoplasm of liver and intrahepatic bile duct: Secondary | ICD-10-CM

## 2021-05-13 DIAGNOSIS — Z79899 Other long term (current) drug therapy: Secondary | ICD-10-CM | POA: Insufficient documentation

## 2021-05-13 DIAGNOSIS — C180 Malignant neoplasm of cecum: Secondary | ICD-10-CM | POA: Insufficient documentation

## 2021-05-13 DIAGNOSIS — Z5111 Encounter for antineoplastic chemotherapy: Secondary | ICD-10-CM | POA: Insufficient documentation

## 2021-05-13 LAB — CBC WITH DIFFERENTIAL (CANCER CENTER ONLY)
Abs Immature Granulocytes: 0.01 10*3/uL (ref 0.00–0.07)
Basophils Absolute: 0.1 10*3/uL (ref 0.0–0.1)
Basophils Relative: 2 %
Eosinophils Absolute: 0.2 10*3/uL (ref 0.0–0.5)
Eosinophils Relative: 4 %
HCT: 30.7 % — ABNORMAL LOW (ref 39.0–52.0)
Hemoglobin: 10 g/dL — ABNORMAL LOW (ref 13.0–17.0)
Immature Granulocytes: 0 %
Lymphocytes Relative: 32 %
Lymphs Abs: 1.2 10*3/uL (ref 0.7–4.0)
MCH: 28.5 pg (ref 26.0–34.0)
MCHC: 32.6 g/dL (ref 30.0–36.0)
MCV: 87.5 fL (ref 80.0–100.0)
Monocytes Absolute: 0.7 10*3/uL (ref 0.1–1.0)
Monocytes Relative: 17 %
Neutro Abs: 1.7 10*3/uL (ref 1.7–7.7)
Neutrophils Relative %: 45 %
Platelet Count: 106 10*3/uL — ABNORMAL LOW (ref 150–400)
RBC: 3.51 MIL/uL — ABNORMAL LOW (ref 4.22–5.81)
RDW: 21.4 % — ABNORMAL HIGH (ref 11.5–15.5)
WBC Count: 3.7 10*3/uL — ABNORMAL LOW (ref 4.0–10.5)
nRBC: 0 % (ref 0.0–0.2)

## 2021-05-13 LAB — CMP (CANCER CENTER ONLY)
ALT: 18 U/L (ref 0–44)
AST: 25 U/L (ref 15–41)
Albumin: 3.7 g/dL (ref 3.5–5.0)
Alkaline Phosphatase: 133 U/L — ABNORMAL HIGH (ref 38–126)
Anion gap: 8 (ref 5–15)
BUN: 12 mg/dL (ref 6–20)
CO2: 23 mmol/L (ref 22–32)
Calcium: 9.1 mg/dL (ref 8.9–10.3)
Chloride: 104 mmol/L (ref 98–111)
Creatinine: 0.99 mg/dL (ref 0.61–1.24)
GFR, Estimated: >60 mL/min (ref >60)
Glucose, Bld: 141 mg/dL — ABNORMAL HIGH (ref 70–99)
Potassium: 3.7 mmol/L (ref 3.5–5.1)
Sodium: 135 mmol/L (ref 135–145)
Total Bilirubin: 0.6 mg/dL (ref 0.3–1.2)
Total Protein: 6.9 g/dL (ref 6.5–8.1)

## 2021-05-13 MED ORDER — FLUOROURACIL CHEMO INJECTION 2.5 GM/50ML
400.0000 mg/m2 | Freq: Once | INTRAVENOUS | Status: AC
  Administered 2021-05-13: 750 mg via INTRAVENOUS
  Filled 2021-05-13: qty 15

## 2021-05-13 MED ORDER — SODIUM CHLORIDE 0.9 % IV SOLN
2400.0000 mg/m2 | INTRAVENOUS | Status: DC
  Administered 2021-05-13: 4450 mg via INTRAVENOUS
  Filled 2021-05-13: qty 89

## 2021-05-13 MED ORDER — PROCHLORPERAZINE MALEATE 10 MG PO TABS
10.0000 mg | ORAL_TABLET | Freq: Once | ORAL | Status: AC
  Administered 2021-05-13: 10 mg via ORAL
  Filled 2021-05-13: qty 1

## 2021-05-13 MED ORDER — DEXTROSE 5 % IV SOLN
Freq: Once | INTRAVENOUS | Status: AC
  Filled 2021-05-13: qty 250

## 2021-05-13 MED ORDER — SODIUM CHLORIDE 0.9 % IV SOLN
400.0000 mg/m2 | Freq: Once | INTRAVENOUS | Status: AC
  Administered 2021-05-13: 744 mg via INTRAVENOUS
  Filled 2021-05-13: qty 37.2

## 2021-05-13 NOTE — Patient Instructions (Signed)
Implanted Port Home Guide An implanted port is a device that is placed under the skin. It is usually placed in the chest. The device can be used to give IV medicine, to take blood, or for dialysis. You may have an implanted port if: You need IV medicine that would be irritating to the small veins in your hands or arms. You need IV medicines, such as antibiotics, for a long period of time. You need IV nutrition for a long period of time. You need dialysis. When you have a port, your health care provider can choose to use the port instead of veins in your arms for these procedures. You may have fewer limitations when using a port than you would if you used other types of long-term IVs, and you will likely be able to return to normal activities afteryour incision heals. An implanted port has two main parts: Reservoir. The reservoir is the part where a needle is inserted to give medicines or draw blood. The reservoir is round. After it is placed, it appears as a small, raised area under your skin. Catheter. The catheter is a thin, flexible tube that connects the reservoir to a vein. Medicine that is inserted into the reservoir goes into the catheter and then into the vein. How is my port accessed? To access your port: A numbing cream may be placed on the skin over the port site. Your health care provider will put on a mask and sterile gloves. The skin over your port will be cleaned carefully with a germ-killing soap and allowed to dry. Your health care provider will gently pinch the port and insert a needle into it. Your health care provider will check for a blood return to make sure the port is in the vein and is not clogged. If your port needs to remain accessed to get medicine continuously (constant infusion), your health care provider will place a clear bandage (dressing) over the needle site. The dressing and needle will need to be changed every week, or as told by your health care provider. What  is flushing? Flushing helps keep the port from getting clogged. Follow instructions from your health care provider about how and when to flush the port. Ports are usually flushed with saline solution or a medicine called heparin. The need for flushing will depend on how the port is used: If the port is only used from time to time to give medicines or draw blood, the port may need to be flushed: Before and after medicines have been given. Before and after blood has been drawn. As part of routine maintenance. Flushing may be recommended every 4-6 weeks. If a constant infusion is running, the port may not need to be flushed. Throw away any syringes in a disposal container that is meant for sharp items (sharps container). You can buy a sharps container from a pharmacy, or you can make one by using an empty hard plastic bottle with a cover. How long will my port stay implanted? The port can stay in for as long as your health care provider thinks it is needed. When it is time for the port to come out, a surgery will be done to remove it. The surgery will be similar to the procedure that was done to putthe port in. Follow these instructions at home:  Flush your port as told by your health care provider. If you need an infusion over several days, follow instructions from your health care provider about how to take   care of your port site. Make sure you: Wash your hands with soap and water before you change your dressing. If soap and water are not available, use alcohol-based hand sanitizer. Change your dressing as told by your health care provider. Place any used dressings or infusion bags into a plastic bag. Throw that bag in the trash. Keep the dressing that covers the needle clean and dry. Do not get it wet. Do not use scissors or sharp objects near the tube. Keep the tube clamped, unless it is being used. Check your port site every day for signs of infection. Check for: Redness, swelling, or  pain. Fluid or blood. Pus or a bad smell. Protect the skin around the port site. Avoid wearing bra straps that rub or irritate the site. Protect the skin around your port from seat belts. Place a soft pad over your chest if needed. Bathe or shower as told by your health care provider. The site may get wet as long as you are not actively receiving an infusion. Return to your normal activities as told by your health care provider. Ask your health care provider what activities are safe for you. Carry a medical alert card or wear a medical alert bracelet at all times. This will let health care providers know that you have an implanted port in case of an emergency. Get help right away if: You have redness, swelling, or pain at the port site. You have fluid or blood coming from your port site. You have pus or a bad smell coming from the port site. You have a fever. Summary Implanted ports are usually placed in the chest for long-term IV access. Follow instructions from your health care provider about flushing the port and changing bandages (dressings). Take care of the area around your port by avoiding clothing that puts pressure on the area, and by watching for signs of infection. Protect the skin around your port from seat belts. Place a soft pad over your chest if needed. Get help right away if you have a fever or you have redness, swelling, pain, drainage, or a bad smell at the port site. This information is not intended to replace advice given to you by your health care provider. Make sure you discuss any questions you have with your healthcare provider. Document Revised: 02/12/2020 Document Reviewed: 02/12/2020 [employer] Patient Education  2022 [employer]  

## 2021-05-13 NOTE — Patient Instructions (Signed)
[facility]  Discharge Instructions: Thank you for choosing [facility] to provide your oncology and hematology care.   If you have a lab appointment with the [facility], please go directly to the [facility] and check in at the registration area.   Wear comfortable clothing and clothing appropriate for easy access to any Portacath or PICC line.   We strive to give you quality time with your provider. You may need to reschedule your appointment if you arrive late (15 or more minutes).  Arriving late affects you and other patients whose appointments are after yours.  Also, if you miss three or more appointments without notifying the office, you may be dismissed from the clinic at the provider's discretion.      For prescription refill requests, have your pharmacy contact our office and allow 72 hours for refills to be completed.    Today you received the following chemotherapy and/or immunotherapy agents Leucovorin and 5FU      To help prevent nausea and vomiting after your treatment, we encourage you to take your nausea medication as directed.  BELOW ARE SYMPTOMS THAT SHOULD BE REPORTED IMMEDIATELY: *FEVER GREATER THAN 100.4 F (38 C) OR HIGHER *CHILLS OR SWEATING *NAUSEA AND VOMITING THAT IS NOT CONTROLLED WITH YOUR NAUSEA MEDICATION *UNUSUAL SHORTNESS OF BREATH *UNUSUAL BRUISING OR BLEEDING *URINARY PROBLEMS (pain or burning when urinating, or frequent urination) *BOWEL PROBLEMS (unusual diarrhea, constipation, pain near the anus) TENDERNESS IN MOUTH AND THROAT WITH OR WITHOUT PRESENCE OF ULCERS (sore throat, sores in mouth, or a toothache) UNUSUAL RASH, SWELLING OR PAIN  UNUSUAL VAGINAL DISCHARGE OR ITCHING   Items with * indicate a potential emergency and should be followed up as soon as possible or go to the Emergency Department if any problems should occur.  Please show the CHEMOTHERAPY ALERT CARD or IMMUNOTHERAPY ALERT CARD at  check-in to the Emergency Department and triage nurse.  Should you have questions after your visit or need to cancel or reschedule your appointment, please contact [facility]  Dept: [phone]  and follow the prompts.  Office hours are 8:00 a.m. to 4:30 p.m. Monday - Friday. Please note that voicemails left after 4:00 p.m. may not be returned until the following business day.  We are closed weekends and major holidays. You have access to a nurse at all times for urgent questions. Please call the main number to the clinic Dept: [phone] and follow the prompts.   For any non-urgent questions, you may also contact your provider using MyChart. We now offer e-Visits for anyone 57 and older to request care online for non-urgent symptoms. For details visit [email or web address].   Also download the MyChart app! Go to the app store, search "MyChart", open the app, select [facility], and log in with your MyChart username and password.  Due to Covid, a mask is required upon entering the hospital/clinic. If you do not have a mask, one will be given to you upon arrival. For doctor visits, patients may have 1 support person aged 40 or older with them. For treatment visits, patients cannot have anyone with them due to current Covid guidelines and our immunocompromised population.

## 2021-05-13 NOTE — Progress Notes (Signed)
[facility] OFFICE PROGRESS NOTE   Diagnosis: Colon cancer  INTERVAL HISTORY:   Eric [name] completed another cycle of FOLFOX on 04/29/2021.  He reports 1 episode of nausea following chemotherapy.  He has developed persistent numbness and tingling in the hands.  He has numbness in the feet when he is not wearing shoes.  Intermittent abdominal pain.  Right leg pain is controlled with gabapentin.  He reports increased stool output from the ostomy.  The stool is formed.  Objective:  Vital signs in last 24 hours:  Blood pressure 118/86, pulse (!) 55, temperature 98.2 F (36.8 C), temperature source Oral, resp. rate 18, height _0  (1.905 m), weight 143 lb 12.8 oz (65.2 kg), SpO2 99 %.    HEENT: No thrush or ulcers Resp: Lungs clear bilaterally Cardio: Regular rate and rhythm GI: No hepatomegaly, right abdomen colostomy Vascular: No leg edema Neuro: Mild to moderate loss of vibratory sense at the fingertips bilaterally    Portacath/PICC-without erythema  Lab Results:  Lab Results  Component Value Date   WBC 3.7 (L) 05/13/2021   HGB 10.0 (L) 05/13/2021   HCT 30.7 (L) 05/13/2021   MCV 87.5 05/13/2021   PLT 106 (L) 05/13/2021   NEUTROABS 1.7 05/13/2021    CMP  Lab Results  Component Value Date   NA 136 04/29/2021   K 3.4 (L) 04/29/2021   CL 103 04/29/2021   CO2 24 04/29/2021   GLUCOSE 109 (H) 04/29/2021   BUN 10 04/29/2021   CREATININE 0.89 04/29/2021   CALCIUM 8.8 (L) 04/29/2021   PROT 6.9 04/29/2021   ALBUMIN 3.5 04/29/2021   AST 24 04/29/2021   ALT 16 04/29/2021   ALKPHOS 142 (H) 04/29/2021   BILITOT 0.7 04/29/2021   GFRNONAA >60 04/29/2021    Lab Results  Component Value Date   CEA1 0.8 11/19/2020    Lab Results  Component Value Date   INR 1.2 11/15/2020   LABPROT 14.5 11/15/2020    Imaging:  No results found.  Medications: I have reviewed the patient's current medications.   Assessment/Plan: Stage IV adenocarcinoma of the  colon -11/15/2020 CT abdomen/pelvis with contrast-long segment masslike mural thickening of the cecum, posterior perforation at the site of the cecal wall thickening with multilocular right retroperitoneal abscess, indeterminate hypodensity in the inferior right lobe of the liver. -11/19/2020 CEA 0.8 -11/20/2020 MRI of the abdomen and pelvis with and without contrast-3.5 x 2.5 cm heterogeneous enhancing lesion in the inferior peripheral right liver with 2 smaller lesions with similar features. -11/20/2020 ultrasound-guided liver biopsy consistent with adenocarcinoma.  Foundation 1-MSS, tumor mutation burden 8,KRAS wt -11/25/2020 CT abdomen/pelvis with contrast-interval placement of a right lower quadrant percutaneous pigtail drainage catheter with near resolution of the retroperitoneal fluid collection, redemonstrated large fungating mass at the cecal base measuring 11.9 x 9.1 x 8.4 cm with enlarged lymph nodes in the right lower quadrant mesocolon. -11/28/2020 exploratory laparotomy, right colectomy, ileostomy creation, cystoscopy with stent placement             -Surgical pathology showed adenocarcinoma, moderately differentiated, 12 cm, 0/14+ lymph nodes, lymphovascular space invasion present, radial margin involved by invasive adenocarcinoma, appendix with intraluminal             adenocarcinoma, pT4, PN 0 -12/09/2020 CT abdomen/pelvis with contrast-interval right colectomy and right lower quadrant ileostomy, stable right retroperitoneal soft tissue density along the psoas and iliacus muscles, mild progression of liver metastases since prior study. -Cycle 1 FOLFOX 12/24/2020 -Cycle 2 FOLFOX  01/06/2021 -Cycle 3 FOLFOX 01/20/2021 -Cycle 4 FOLFOX 02/03/2021 -Cycle 5 FOLFOX 02/17/2021 -Cycle 6 FOLFOX 03/03/2021 -Restaging CTs 03/05/2021- substantial interval decrease in size of predominantly hypodense lesions throughout the liver.  Significant interval resolution of previously seen extensive, heterogeneous soft tissue  in the right retroperitoneal soft tissues and paracolic gutter overlying the psoas and iliacus muscles. -Cycle 7 FOLFOX 03/19/2021 -Cycle 8 FOLFOX 04/03/2021, oxaliplatin dose reduced secondary to neutropenia and thrombocytopenia -Cycle 9 FOLFOX 04/15/2021 -Cycle 10 FOLFOX 04/29/2021 -Cycle 11 FOLFOX 05/13/2021, oxaliplatin held secondary to neuropathy 2.  Sepsis secondary to perforated colon 3.  Retroperitoneal abscess 4.  Protein calorie malnutrition 5.  Thrombocytosis 6.  Iron deficiency anemia 7.  COVID-19 infection, 11/15/2020 8.  Tobacco abuse 9.  Family history of colon cancer 10.  Port-A-Cath placement, Dr. 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  He has developed oxaliplatin neuropathy.  Oxaliplatin will be held from the chemotherapy regimen today.  He will return for an office visit and chemotherapy in 2 weeks.  He will undergo a restaging CT evaluation after the next cycle of chemotherapy.  He will discontinue FiberCon to see if this will help with the increased stool output.  [name], MD  05/13/2021  9:18 AM

## 2021-05-15 ENCOUNTER — Other Ambulatory Visit: Payer: Self-pay

## 2021-05-15 ENCOUNTER — Inpatient Hospital Stay: Payer: Self-pay

## 2021-05-15 VITALS — BP 106/79 | HR 94 | Temp 98.2°F | Resp 18

## 2021-05-15 DIAGNOSIS — K631 Perforation of intestine (nontraumatic): Secondary | ICD-10-CM

## 2021-05-15 DIAGNOSIS — C787 Secondary malignant neoplasm of liver and intrahepatic bile duct: Secondary | ICD-10-CM

## 2021-05-15 DIAGNOSIS — C189 Malignant neoplasm of colon, unspecified: Secondary | ICD-10-CM

## 2021-05-15 MED ORDER — HEPARIN SOD (PORK) LOCK FLUSH 100 UNIT/ML IV SOLN
500.0000 [IU] | Freq: Once | INTRAVENOUS | Status: AC | PRN
  Administered 2021-05-15: 500 [IU]
  Filled 2021-05-15: qty 5

## 2021-05-15 MED ORDER — SODIUM CHLORIDE 0.9% FLUSH
10.0000 mL | INTRAVENOUS | Status: DC | PRN
  Administered 2021-05-15: 10 mL
  Filled 2021-05-15: qty 10

## 2021-05-25 ENCOUNTER — Other Ambulatory Visit: Payer: Self-pay | Admitting: Oncology

## 2021-05-27 ENCOUNTER — Inpatient Hospital Stay: Payer: Self-pay

## 2021-05-27 ENCOUNTER — Other Ambulatory Visit: Payer: Self-pay

## 2021-05-27 ENCOUNTER — Encounter: Payer: Self-pay | Admitting: Oncology

## 2021-05-27 ENCOUNTER — Telehealth: Payer: Self-pay

## 2021-05-27 ENCOUNTER — Inpatient Hospital Stay (HOSPITAL_BASED_OUTPATIENT_CLINIC_OR_DEPARTMENT_OTHER): Payer: Self-pay | Admitting: Oncology

## 2021-05-27 ENCOUNTER — Other Ambulatory Visit (HOSPITAL_COMMUNITY): Payer: Self-pay

## 2021-05-27 VITALS — BP 133/90 | HR 69 | Temp 98.0°F | Resp 18 | Ht 75.0 in | Wt 146.4 lb

## 2021-05-27 DIAGNOSIS — C787 Secondary malignant neoplasm of liver and intrahepatic bile duct: Secondary | ICD-10-CM

## 2021-05-27 DIAGNOSIS — C189 Malignant neoplasm of colon, unspecified: Secondary | ICD-10-CM

## 2021-05-27 DIAGNOSIS — K631 Perforation of intestine (nontraumatic): Secondary | ICD-10-CM

## 2021-05-27 LAB — CBC WITH DIFFERENTIAL (CANCER CENTER ONLY)
Abs Immature Granulocytes: 0.03 10*3/uL (ref 0.00–0.07)
Basophils Absolute: 0.1 10*3/uL (ref 0.0–0.1)
Basophils Relative: 1 %
Eosinophils Absolute: 0.2 10*3/uL (ref 0.0–0.5)
Eosinophils Relative: 3 %
HCT: 32.8 % — ABNORMAL LOW (ref 39.0–52.0)
Hemoglobin: 10.6 g/dL — ABNORMAL LOW (ref 13.0–17.0)
Immature Granulocytes: 1 %
Lymphocytes Relative: 26 %
Lymphs Abs: 1.7 10*3/uL (ref 0.7–4.0)
MCH: 28.7 pg (ref 26.0–34.0)
MCHC: 32.3 g/dL (ref 30.0–36.0)
MCV: 88.9 fL (ref 80.0–100.0)
Monocytes Absolute: 0.9 10*3/uL (ref 0.1–1.0)
Monocytes Relative: 14 %
Neutro Abs: 3.5 10*3/uL (ref 1.7–7.7)
Neutrophils Relative %: 55 %
Platelet Count: 127 10*3/uL — ABNORMAL LOW (ref 150–400)
RBC: 3.69 MIL/uL — ABNORMAL LOW (ref 4.22–5.81)
RDW: 20.8 % — ABNORMAL HIGH (ref 11.5–15.5)
WBC Count: 6.5 10*3/uL (ref 4.0–10.5)
nRBC: 0 % (ref 0.0–0.2)

## 2021-05-27 LAB — CMP (CANCER CENTER ONLY)
ALT: 23 U/L (ref 0–44)
AST: 30 U/L (ref 15–41)
Albumin: 3.5 g/dL (ref 3.5–5.0)
Alkaline Phosphatase: 142 U/L — ABNORMAL HIGH (ref 38–126)
Anion gap: 7 (ref 5–15)
BUN: 7 mg/dL (ref 6–20)
CO2: 25 mmol/L (ref 22–32)
Calcium: 9.2 mg/dL (ref 8.9–10.3)
Chloride: 103 mmol/L (ref 98–111)
Creatinine: 0.77 mg/dL (ref 0.61–1.24)
GFR, Estimated: >60 mL/min (ref >60)
Glucose, Bld: 124 mg/dL — ABNORMAL HIGH (ref 70–99)
Potassium: 3.7 mmol/L (ref 3.5–5.1)
Sodium: 135 mmol/L (ref 135–145)
Total Bilirubin: 0.6 mg/dL (ref 0.3–1.2)
Total Protein: 7.1 g/dL (ref 6.5–8.1)

## 2021-05-27 MED ORDER — SODIUM CHLORIDE 0.9% FLUSH: 10.0000 mL | INTRAVENOUS | Status: DC | PRN

## 2021-05-27 MED ORDER — FLUOROURACIL CHEMO INJECTION 2.5 GM/50ML
400.0000 mg/m2 | Freq: Once | INTRAVENOUS | Status: AC
Start: 2021-05-27 — End: 2021-05-27
  Administered 2021-05-27: 750 mg via INTRAVENOUS
  Filled 2021-05-27: qty 15

## 2021-05-27 MED ORDER — ONDANSETRON HCL 8 MG PO TABS
8.0000 mg | ORAL_TABLET | Freq: Once | ORAL | Status: AC
Start: 2021-05-27 — End: 2021-05-27
  Administered 2021-05-27: 8 mg via ORAL
  Filled 2021-05-27: qty 1

## 2021-05-27 MED ORDER — LEUCOVORIN CALCIUM INJECTION 350 MG
400.0000 mg/m2 | Freq: Once | INTRAVENOUS | Status: AC
  Administered 2021-05-27: 744 mg via INTRAVENOUS
  Filled 2021-05-27: qty 37.2

## 2021-05-27 MED ORDER — ONDANSETRON HCL 8 MG PO TABS
ORAL_TABLET | ORAL | 1 refills | Status: DC
Start: 2021-05-27 — End: 2021-08-05
  Filled 2021-05-27: qty 30, 10d supply, fill #0
  Filled 2021-07-09: qty 30, 10d supply, fill #1

## 2021-05-27 MED ORDER — LIDOCAINE-PRILOCAINE 2.5-2.5 % EX CREA
1.0000 "application " | TOPICAL_CREAM | CUTANEOUS | 2 refills | Status: AC
  Filled 2021-05-27: qty 30, 30d supply, fill #0
  Filled 2022-05-11: qty 30, 15d supply, fill #1

## 2021-05-27 MED ORDER — DEXTROSE 5 % IV SOLN
Freq: Once | INTRAVENOUS | Status: AC
Start: 2021-05-27 — End: 2021-05-27

## 2021-05-27 MED ORDER — FLUOROURACIL CHEMO INJECTION 5 GM/100ML
2400.0000 mg/m2 | INTRAVENOUS | Status: DC
  Administered 2021-05-27: 4450 mg via INTRAVENOUS
  Filled 2021-05-27: qty 89

## 2021-05-27 NOTE — Telephone Encounter (Signed)
Referral to ostomy clinic done today per Dr Benay Spice

## 2021-05-27 NOTE — Progress Notes (Signed)
[facility] OFFICE PROGRESS NOTE   Diagnosis: Colon cancer  INTERVAL HISTORY:   Eric [name] completed a cycle of 5-fluorouracil on 05/13/2021.  He had an episode of nausea and vomiting a few days following chemotherapy.  He had a recent fall when bending over in the kitchen.  The medial side of the ostomy appliance has been leaking.  He has persistent peripheral numbness.  Objective:  Vital signs in last 24 hours:  Blood pressure 133/90, pulse 69, temperature 98 F (36.7 C), temperature source Oral, resp. rate 18, height $RemoveBe'6\' 3"'[id number]$  (1.905 m), weight 146 lb 6.4 oz (66.4 kg), SpO2 100 %.    HEENT: No thrush or ulcers Resp: Distant breath sounds, no respiratory distress Cardio: Regular rate and rhythm GI: No hepatomegaly, right abdomen colostomy Vascular: No leg edema  Skin: Mild hyperpigmentation of the hands  Portacath/PICC-without erythema  Lab Results:  Lab Results  Component Value Date   WBC 6.5 05/27/2021   HGB 10.6 (L) 05/27/2021   HCT 32.8 (L) 05/27/2021   MCV 88.9 05/27/2021   PLT 127 (L) 05/27/2021   NEUTROABS 3.5 05/27/2021    CMP  Lab Results  Component Value Date   NA 135 05/27/2021   K 3.7 05/27/2021   CL 103 05/27/2021   CO2 25 05/27/2021   GLUCOSE 124 (H) 05/27/2021   BUN 7 05/27/2021   CREATININE 0.77 05/27/2021   CALCIUM 9.2 05/27/2021   PROT 7.1 05/27/2021   ALBUMIN 3.5 05/27/2021   AST 30 05/27/2021   ALT 23 05/27/2021   ALKPHOS 142 (H) 05/27/2021   BILITOT 0.6 05/27/2021   GFRNONAA >60 05/27/2021    Lab Results  Component Value Date   CEA1 0.8 11/19/2020    Medications: I have reviewed the patient's current medications.   Assessment/Plan: Stage IV adenocarcinoma of the colon -11/15/2020 CT abdomen/pelvis with contrast-long segment masslike mural thickening of the cecum, posterior perforation at the site of the cecal wall thickening with multilocular right retroperitoneal abscess, indeterminate hypodensity in the inferior right  lobe of the liver. -11/19/2020 CEA 0.8 -11/20/2020 MRI of the abdomen and pelvis with and without contrast-3.5 x 2.5 cm heterogeneous enhancing lesion in the inferior peripheral right liver with 2 smaller lesions with similar features. -11/20/2020 ultrasound-guided liver biopsy consistent with adenocarcinoma.  Foundation 1-MSS, tumor mutation burden 8,KRAS wt -11/25/2020 CT abdomen/pelvis with contrast-interval placement of a right lower quadrant percutaneous pigtail drainage catheter with near resolution of the retroperitoneal fluid collection, redemonstrated large fungating mass at the cecal base measuring 11.9 x 9.1 x 8.4 cm with enlarged lymph nodes in the right lower quadrant mesocolon. -11/28/2020 exploratory laparotomy, right colectomy, ileostomy creation, cystoscopy with stent placement             -Surgical pathology showed adenocarcinoma, moderately differentiated, 12 cm, 0/14+ lymph nodes, lymphovascular space invasion present, radial margin involved by invasive adenocarcinoma, appendix with intraluminal             adenocarcinoma, pT4, PN 0 -12/09/2020 CT abdomen/pelvis with contrast-interval right colectomy and right lower quadrant ileostomy, stable right retroperitoneal soft tissue density along the psoas and iliacus muscles, mild progression of liver metastases since prior study. -Cycle 1 FOLFOX 12/24/2020 -Cycle 2 FOLFOX 01/06/2021 -Cycle 3 FOLFOX 01/20/2021 -Cycle 4 FOLFOX 02/03/2021 -Cycle 5 FOLFOX 02/17/2021 -Cycle 6 FOLFOX 03/03/2021 -Restaging CTs 03/05/2021- substantial interval decrease in size of predominantly hypodense lesions throughout the liver.  Significant interval resolution of previously seen extensive, heterogeneous soft tissue in the right retroperitoneal soft tissues  and paracolic gutter overlying the psoas and iliacus muscles. -Cycle 7 FOLFOX 03/19/2021 -Cycle 8 FOLFOX 04/03/2021, oxaliplatin dose reduced secondary to neutropenia and thrombocytopenia -Cycle 9 FOLFOX 04/15/2021 -Cycle  10 FOLFOX 04/29/2021 -Cycle 11 FOLFOX 05/13/2021, oxaliplatin held secondary to neuropathy -Cycle 12 FOLFOX 05/27/2021, oxaliplatin held secondary to neuropathy 2.  Sepsis secondary to perforated colon 3.  Retroperitoneal abscess 4.  Protein calorie malnutrition 5.  Thrombocytosis 6.  Iron deficiency anemia 7.  COVID-19 infection, 11/15/2020 8.  Tobacco abuse 9.  Family history of colon cancer 10.  Port-A-Cath placement, Dr. 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 Longman appears stable.  He continues to have oxaliplatin neuropathy symptoms.  He will complete another cycle of 5-fluorouracil today.  He will undergo a restaging CT evaluation prior to an office visit in 2 weeks.  [name], MD  05/27/2021  10:39 AM

## 2021-05-27 NOTE — Patient Instructions (Signed)
Implanted Port Home Guide An implanted port is a device that is placed under the skin. It is usually placed in the chest. The device can be used to give IV medicine, to take blood, or for dialysis. You may have an implanted port if: You need IV medicine that would be irritating to the small veins in your hands or arms. You need IV medicines, such as antibiotics, for a long period of time. You need IV nutrition for a long period of time. You need dialysis. When you have a port, your health care provider can choose to use the port instead of veins in your arms for these procedures. You may have fewer limitations when using a port than you would if you used other types of long-term IVs, and you will likely be able to return to normal activities afteryour incision heals. An implanted port has two main parts: Reservoir. The reservoir is the part where a needle is inserted to give medicines or draw blood. The reservoir is round. After it is placed, it appears as a small, raised area under your skin. Catheter. The catheter is a thin, flexible tube that connects the reservoir to a vein. Medicine that is inserted into the reservoir goes into the catheter and then into the vein. How is my port accessed? To access your port: A numbing cream may be placed on the skin over the port site. Your health care provider will put on a mask and sterile gloves. The skin over your port will be cleaned carefully with a germ-killing soap and allowed to dry. Your health care provider will gently pinch the port and insert a needle into it. Your health care provider will check for a blood return to make sure the port is in the vein and is not clogged. If your port needs to remain accessed to get medicine continuously (constant infusion), your health care provider will place a clear bandage (dressing) over the needle site. The dressing and needle will need to be changed every week, or as told by your health care provider. What  is flushing? Flushing helps keep the port from getting clogged. Follow instructions from your health care provider about how and when to flush the port. Ports are usually flushed with saline solution or a medicine called heparin. The need for flushing will depend on how the port is used: If the port is only used from time to time to give medicines or draw blood, the port may need to be flushed: Before and after medicines have been given. Before and after blood has been drawn. As part of routine maintenance. Flushing may be recommended every 4-6 weeks. If a constant infusion is running, the port may not need to be flushed. Throw away any syringes in a disposal container that is meant for sharp items (sharps container). You can buy a sharps container from a pharmacy, or you can make one by using an empty hard plastic bottle with a cover. How long will my port stay implanted? The port can stay in for as long as your health care provider thinks it is needed. When it is time for the port to come out, a surgery will be done to remove it. The surgery will be similar to the procedure that was done to putthe port in. Follow these instructions at home:  Flush your port as told by your health care provider. If you need an infusion over several days, follow instructions from your health care provider about how to take   care of your port site. Make sure you: Wash your hands with soap and water before you change your dressing. If soap and water are not available, use alcohol-based hand sanitizer. Change your dressing as told by your health care provider. Place any used dressings or infusion bags into a plastic bag. Throw that bag in the trash. Keep the dressing that covers the needle clean and dry. Do not get it wet. Do not use scissors or sharp objects near the tube. Keep the tube clamped, unless it is being used. Check your port site every day for signs of infection. Check for: Redness, swelling, or  pain. Fluid or blood. Pus or a bad smell. Protect the skin around the port site. Avoid wearing bra straps that rub or irritate the site. Protect the skin around your port from seat belts. Place a soft pad over your chest if needed. Bathe or shower as told by your health care provider. The site may get wet as long as you are not actively receiving an infusion. Return to your normal activities as told by your health care provider. Ask your health care provider what activities are safe for you. Carry a medical alert card or wear a medical alert bracelet at all times. This will let health care providers know that you have an implanted port in case of an emergency. Get help right away if: You have redness, swelling, or pain at the port site. You have fluid or blood coming from your port site. You have pus or a bad smell coming from the port site. You have a fever. Summary Implanted ports are usually placed in the chest for long-term IV access. Follow instructions from your health care provider about flushing the port and changing bandages (dressings). Take care of the area around your port by avoiding clothing that puts pressure on the area, and by watching for signs of infection. Protect the skin around your port from seat belts. Place a soft pad over your chest if needed. Get help right away if you have a fever or you have redness, swelling, pain, drainage, or a bad smell at the port site. This information is not intended to replace advice given to you by your health care provider. Make sure you discuss any questions you have with your healthcare provider. Document Revised: 02/12/2020 Document Reviewed: 02/12/2020 [employer] Patient Education  2022 [employer]  

## 2021-05-27 NOTE — Patient Instructions (Addendum)
The chemotherapy medication bag should finish at 46 hours, 96 hours, or 7 days. For example, if your pump is scheduled for 46 hours and it was put on at 4:00 p.m., it should finish at 2:00 p.m. the day it is scheduled to come off regardless of your appointment time.     Estimated time to finish at 10:30am on 05/29/21.   If the display on your pump reads "Low Volume" and it is beeping, take the batteries out of the pump and come to the cancer center for it to be taken off.   If the pump alarms go off prior to the pump reading "Low Volume" then call [phone] and someone can assist you.  If the plunger comes out and the chemotherapy medication is leaking out, please use your home chemo spill kit to clean up the spill. Do NOT use paper towels or other household products.  If you have problems or questions regarding your pump, please call either [phone] (24 hours a day) or the cancer center Monday-Friday 8:00 a.m.- 4:30 p.m. at the clinic number and we will assist you. If you are unable to get assistance, then go to the nearest Emergency Department and ask the staff to contact the IV team for assistance.   [facility]  Discharge Instructions: Thank you for choosing [facility] to provide your oncology and hematology care.   If you have a lab appointment with the [facility], please go directly to the [facility] and check in at the registration area.   Wear comfortable clothing and clothing appropriate for easy access to any Portacath or PICC line.   We strive to give you quality time with your provider. You may need to reschedule your appointment if you arrive late (15 or more minutes).  Arriving late affects you and other patients whose appointments are after yours.  Also, if you miss three or more appointments without notifying the office, you may be dismissed from the clinic at the provider's discretion.      For prescription refill  requests, have your pharmacy contact our office and allow 72 hours for refills to be completed.    Today you received the following chemotherapy and/or immunotherapy agents Leucovorin and 5FU      To help prevent nausea and vomiting after your treatment, we encourage you to take your nausea medication as directed.  BELOW ARE SYMPTOMS THAT SHOULD BE REPORTED IMMEDIATELY: *FEVER GREATER THAN 100.4 F (38 C) OR HIGHER *CHILLS OR SWEATING *NAUSEA AND VOMITING THAT IS NOT CONTROLLED WITH YOUR NAUSEA MEDICATION *UNUSUAL SHORTNESS OF BREATH *UNUSUAL BRUISING OR BLEEDING *URINARY PROBLEMS (pain or burning when urinating, or frequent urination) *BOWEL PROBLEMS (unusual diarrhea, constipation, pain near the anus) TENDERNESS IN MOUTH AND THROAT WITH OR WITHOUT PRESENCE OF ULCERS (sore throat, sores in mouth, or a toothache) UNUSUAL RASH, SWELLING OR PAIN  UNUSUAL VAGINAL DISCHARGE OR ITCHING   Items with * indicate a potential emergency and should be followed up as soon as possible or go to the Emergency Department if any problems should occur.  Please show the CHEMOTHERAPY ALERT CARD or IMMUNOTHERAPY ALERT CARD at check-in to the Emergency Department and triage nurse.  Should you have questions after your visit or need to cancel or reschedule your appointment, please contact [facility]  Dept: [phone]  and follow the prompts.  Office hours are 8:00 a.m. to 4:30 p.m. Monday - Friday. Please note that voicemails left after  4:00 p.m. may not be returned until the following business day.  We are closed weekends and major holidays. You have access to a nurse at all times for urgent questions. Please call the main number to the clinic Dept: [phone] and follow the prompts.   For any non-urgent questions, you may also contact your provider using MyChart. We now offer e-Visits for anyone 66 and older to request care online for non-urgent symptoms. For details visit  [email or web address].   Also download the MyChart app! Go to the app store, search "MyChart", open the app, select , and log in with your MyChart username and password.  Due to Covid, a mask is required upon entering the hospital/clinic. If you do not have a mask, one will be given to you upon arrival. For doctor visits, patients may have 1 support person aged 36 or older with them. For treatment visits, patients cannot have anyone with them due to current Covid guidelines and our immunocompromised population.

## 2021-05-29 ENCOUNTER — Other Ambulatory Visit: Payer: Self-pay | Admitting: Oncology

## 2021-05-29 ENCOUNTER — Inpatient Hospital Stay: Payer: Self-pay

## 2021-05-29 ENCOUNTER — Encounter: Payer: Self-pay | Admitting: Oncology

## 2021-05-29 ENCOUNTER — Other Ambulatory Visit: Payer: Self-pay

## 2021-05-29 ENCOUNTER — Other Ambulatory Visit (HOSPITAL_COMMUNITY): Payer: Self-pay

## 2021-05-29 VITALS — BP 108/75 | HR 108 | Temp 98.4°F | Resp 18

## 2021-05-29 DIAGNOSIS — K631 Perforation of intestine (nontraumatic): Secondary | ICD-10-CM

## 2021-05-29 DIAGNOSIS — C189 Malignant neoplasm of colon, unspecified: Secondary | ICD-10-CM

## 2021-05-29 MED ORDER — HEPARIN SOD (PORK) LOCK FLUSH 100 UNIT/ML IV SOLN
500.0000 [IU] | Freq: Once | INTRAVENOUS | Status: AC | PRN
  Administered 2021-05-29: 500 [IU]

## 2021-05-29 MED ORDER — SODIUM CHLORIDE 0.9% FLUSH
10.0000 mL | INTRAVENOUS | Status: DC | PRN
  Administered 2021-05-29: 10 mL

## 2021-05-29 MED ORDER — OXYCODONE HCL 10 MG PO TABS
5.0000 mg | ORAL_TABLET | Freq: Four times a day (QID) | ORAL | 0 refills | Status: DC | PRN
  Filled 2021-05-29: qty 25, 7d supply, fill #0

## 2021-06-03 ENCOUNTER — Encounter: Payer: Self-pay | Admitting: *Deleted

## 2021-06-03 NOTE — Progress Notes (Signed)
[facility] clinic [phone] to f/u on referral placed 05/27/21. Provided patient name/MRN and left phone for a return call.

## 2021-06-07 ENCOUNTER — Other Ambulatory Visit: Payer: Self-pay | Admitting: Oncology

## 2021-06-09 ENCOUNTER — Other Ambulatory Visit: Payer: Self-pay

## 2021-06-09 ENCOUNTER — Inpatient Hospital Stay: Payer: Self-pay

## 2021-06-09 ENCOUNTER — Encounter (HOSPITAL_BASED_OUTPATIENT_CLINIC_OR_DEPARTMENT_OTHER): Payer: Self-pay

## 2021-06-09 ENCOUNTER — Ambulatory Visit (HOSPITAL_BASED_OUTPATIENT_CLINIC_OR_DEPARTMENT_OTHER)
Admission: RE | Admit: 2021-06-09 | Discharge: 2021-06-09 | Disposition: A | Discharge status: Home / Self Care | Payer: Medicaid Other | Source: Ambulatory Visit | Attending: Oncology | Admitting: Oncology

## 2021-06-09 DIAGNOSIS — C189 Malignant neoplasm of colon, unspecified: Secondary | ICD-10-CM

## 2021-06-09 DIAGNOSIS — C787 Secondary malignant neoplasm of liver and intrahepatic bile duct: Secondary | ICD-10-CM | POA: Insufficient documentation

## 2021-06-09 LAB — CMP (CANCER CENTER ONLY)
ALT: 17 U/L (ref 0–44)
AST: 22 U/L (ref 15–41)
Albumin: 3.7 g/dL (ref 3.5–5.0)
Alkaline Phosphatase: 140 U/L — ABNORMAL HIGH (ref 38–126)
Anion gap: 8 (ref 5–15)
BUN: 12 mg/dL (ref 6–20)
CO2: 26 mmol/L (ref 22–32)
Calcium: 9.5 mg/dL (ref 8.9–10.3)
Chloride: 103 mmol/L (ref 98–111)
Creatinine: 0.98 mg/dL (ref 0.61–1.24)
GFR, Estimated: >60 mL/min (ref >60)
Glucose, Bld: 100 mg/dL — ABNORMAL HIGH (ref 70–99)
Potassium: 3.9 mmol/L (ref 3.5–5.1)
Sodium: 137 mmol/L (ref 135–145)
Total Bilirubin: 0.6 mg/dL (ref 0.3–1.2)
Total Protein: 7.6 g/dL (ref 6.5–8.1)

## 2021-06-09 LAB — CBC WITH DIFFERENTIAL (CANCER CENTER ONLY)
Abs Immature Granulocytes: 0.04 10*3/uL (ref 0.00–0.07)
Basophils Absolute: 0.1 10*3/uL (ref 0.0–0.1)
Basophils Relative: 2 %
Eosinophils Absolute: 0.5 10*3/uL (ref 0.0–0.5)
Eosinophils Relative: 7 %
HCT: 34.2 % — ABNORMAL LOW (ref 39.0–52.0)
Hemoglobin: 10.8 g/dL — ABNORMAL LOW (ref 13.0–17.0)
Immature Granulocytes: 1 %
Lymphocytes Relative: 24 %
Lymphs Abs: 1.5 10*3/uL (ref 0.7–4.0)
MCH: 28.6 pg (ref 26.0–34.0)
MCHC: 31.6 g/dL (ref 30.0–36.0)
MCV: 90.5 fL (ref 80.0–100.0)
Monocytes Absolute: 1 10*3/uL (ref 0.1–1.0)
Monocytes Relative: 16 %
Neutro Abs: 3.3 10*3/uL (ref 1.7–7.7)
Neutrophils Relative %: 50 %
Platelet Count: 150 10*3/uL (ref 150–400)
RBC: 3.78 MIL/uL — ABNORMAL LOW (ref 4.22–5.81)
RDW: 19.5 % — ABNORMAL HIGH (ref 11.5–15.5)
WBC Count: 6.5 10*3/uL (ref 4.0–10.5)
nRBC: 0 % (ref 0.0–0.2)

## 2021-06-09 LAB — CEA (ACCESS): CEA (CHCC): 2.47 ng/mL (ref 0.00–5.00)

## 2021-06-09 MED ORDER — IOHEXOL 350 MG/ML SOLN
60.0000 mL | Freq: Once | INTRAVENOUS | Status: AC | PRN
  Administered 2021-06-09: 60 mL via INTRAVENOUS

## 2021-06-09 MED ORDER — HEPARIN SOD (PORK) LOCK FLUSH 100 UNIT/ML IV SOLN
500.0000 [IU] | Freq: Once | INTRAVENOUS | Status: AC
  Administered 2021-06-09: 500 [IU] via INTRAVENOUS

## 2021-06-09 NOTE — Patient Instructions (Signed)
Implanted Port Home Guide An implanted port is a device that is placed under the skin. It is usually placed in the chest. The device can be used to give IV medicine, to take blood, or for dialysis. You may have an implanted port if: You need IV medicine that would be irritating to the small veins in your hands or arms. You need IV medicines, such as antibiotics, for a long period of time. You need IV nutrition for a long period of time. You need dialysis. When you have a port, your health care provider can choose to use the port instead of veins in your arms for these procedures. You may have fewer limitations when using a port than you would if you used other types of long-term IVs, and you will likely be able to return to normal activities afteryour incision heals. An implanted port has two main parts: Reservoir. The reservoir is the part where a needle is inserted to give medicines or draw blood. The reservoir is round. After it is placed, it appears as a small, raised area under your skin. Catheter. The catheter is a thin, flexible tube that connects the reservoir to a vein. Medicine that is inserted into the reservoir goes into the catheter and then into the vein. How is my port accessed? To access your port: A numbing cream may be placed on the skin over the port site. Your health care provider will put on a mask and sterile gloves. The skin over your port will be cleaned carefully with a germ-killing soap and allowed to dry. Your health care provider will gently pinch the port and insert a needle into it. Your health care provider will check for a blood return to make sure the port is in the vein and is not clogged. If your port needs to remain accessed to get medicine continuously (constant infusion), your health care provider will place a clear bandage (dressing) over the needle site. The dressing and needle will need to be changed every week, or as told by your health care provider. What  is flushing? Flushing helps keep the port from getting clogged. Follow instructions from your health care provider about how and when to flush the port. Ports are usually flushed with saline solution or a medicine called heparin. The need for flushing will depend on how the port is used: If the port is only used from time to time to give medicines or draw blood, the port may need to be flushed: Before and after medicines have been given. Before and after blood has been drawn. As part of routine maintenance. Flushing may be recommended every 4-6 weeks. If a constant infusion is running, the port may not need to be flushed. Throw away any syringes in a disposal container that is meant for sharp items (sharps container). You can buy a sharps container from a pharmacy, or you can make one by using an empty hard plastic bottle with a cover. How long will my port stay implanted? The port can stay in for as long as your health care provider thinks it is needed. When it is time for the port to come out, a surgery will be done to remove it. The surgery will be similar to the procedure that was done to putthe port in. Follow these instructions at home:  Flush your port as told by your health care provider. If you need an infusion over several days, follow instructions from your health care provider about how to take   care of your port site. Make sure you: Wash your hands with soap and water before you change your dressing. If soap and water are not available, use alcohol-based hand sanitizer. Change your dressing as told by your health care provider. Place any used dressings or infusion bags into a plastic bag. Throw that bag in the trash. Keep the dressing that covers the needle clean and dry. Do not get it wet. Do not use scissors or sharp objects near the tube. Keep the tube clamped, unless it is being used. Check your port site every day for signs of infection. Check for: Redness, swelling, or  pain. Fluid or blood. Pus or a bad smell. Protect the skin around the port site. Avoid wearing bra straps that rub or irritate the site. Protect the skin around your port from seat belts. Place a soft pad over your chest if needed. Bathe or shower as told by your health care provider. The site may get wet as long as you are not actively receiving an infusion. Return to your normal activities as told by your health care provider. Ask your health care provider what activities are safe for you. Carry a medical alert card or wear a medical alert bracelet at all times. This will let health care providers know that you have an implanted port in case of an emergency. Get help right away if: You have redness, swelling, or pain at the port site. You have fluid or blood coming from your port site. You have pus or a bad smell coming from the port site. You have a fever. Summary Implanted ports are usually placed in the chest for long-term IV access. Follow instructions from your health care provider about flushing the port and changing bandages (dressings). Take care of the area around your port by avoiding clothing that puts pressure on the area, and by watching for signs of infection. Protect the skin around your port from seat belts. Place a soft pad over your chest if needed. Get help right away if you have a fever or you have redness, swelling, pain, drainage, or a bad smell at the port site. This information is not intended to replace advice given to you by your health care provider. Make sure you discuss any questions you have with your healthcare provider. Document Revised: 02/12/2020 Document Reviewed: 02/12/2020 [employer] Patient Education  2022 [employer]  

## 2021-06-09 NOTE — Progress Notes (Signed)
[facility] OFFICE PROGRESS NOTE   Diagnosis: Colon cancer  INTERVAL HISTORY:   Eric [name] completed another cycle of 5-fluorouracil on 05/27/2021.  No diarrhea.  He takes oxycodone intermittently for pain in the right leg.  He had an episode of vomiting after working outside yesterday.  He reports a good appetite.  He continues to have numbness in the fingers and toes.  He has difficulty buttoning his shirt.  Objective:  Vital signs in last 24 hours:  Blood pressure 112/90, pulse 100, temperature 97.8 F (36.6 C), temperature source Oral, resp. rate 20, height $RemoveBe'6\' 3"'KWGajyiAW$  (1.905 m), weight 140 lb 9.6 oz (63.8 kg), SpO2 96 %.    HEENT: No thrush or ulcers Resp: Lungs clear bilaterally Cardio: Regular rate and rhythm GI: No hepatosplenomegaly, right lower quadrant ostomy Vascular: No leg edema  Skin: Hyperpigmentation of the hands  Portacath/PICC-without erythema  Lab Results:  Lab Results  Component Value Date   WBC 6.5 06/09/2021   HGB 10.8 (L) 06/09/2021   HCT 34.2 (L) 06/09/2021   MCV 90.5 06/09/2021   PLT 150 06/09/2021   NEUTROABS 3.3 06/09/2021    CMP  Lab Results  Component Value Date   NA 137 06/09/2021   K 3.9 06/09/2021   CL 103 06/09/2021   CO2 26 06/09/2021   GLUCOSE 100 (H) 06/09/2021   BUN 12 06/09/2021   CREATININE 0.98 06/09/2021   CALCIUM 9.5 06/09/2021   PROT 7.6 06/09/2021   ALBUMIN 3.7 06/09/2021   AST 22 06/09/2021   ALT 17 06/09/2021   ALKPHOS 140 (H) 06/09/2021   BILITOT 0.6 06/09/2021   GFRNONAA >60 06/09/2021    Lab Results  Component Value Date   CEA1 0.8 11/19/2020   CEA 2.47 06/09/2021    Lab Results  Component Value Date   INR 1.2 11/15/2020   LABPROT 14.5 11/15/2020    Imaging:  CT ABDOMEN PELVIS W CONTRAST  Result Date: 06/09/2021 CLINICAL DATA:  Colon cancer metastatic to the liver, current chemotherapy EXAM: CT ABDOMEN AND PELVIS WITH CONTRAST TECHNIQUE: Multidetector CT imaging of the abdomen and  pelvis was performed using the standard protocol following bolus administration of intravenous contrast. CONTRAST:  75mL OMNIPAQUE IOHEXOL 350 MG/ML SOLN COMPARISON:  03/05/2021 FINDINGS: Lower chest: Unremarkable Hepatobiliary: Similar appearance of the residua of the hepatic metastatic lesions, with the lesions demonstrating faint calcifications and diminutive size compared to the original exam from 12/09/2020. An index lesion laterally in the right hepatic lobe has faint marginal calcifications and measures 1.3 by 0.8 cm on image 30 series 2, formerly 1.4 by 1.1 cm by my measurement on 03/05/2021. The other for metastatic lesions are punctate and small with mild associated calcifications, for example in the right hepatic lobe adjacent to the gallbladder fossa on image 27 series 2. No new or enlarging metastatic lesions are observed. The gallbladder appears unremarkable. Suspected cyst in segment 3 of the liver on image 29 series 2. Pancreas: Stable punctate calcification in the pancreatic body on image 23 series 2, likely postinflammatory. Otherwise unremarkable. Spleen: Unremarkable Adrenals/Urinary Tract: The adrenal glands and kidneys appear normal. Accentuated density posteriorly along the urinary bladder wall on image 49 series 6 could represent calcification along the lumen, a bladder stone, or a small bladder tumor. Stomach/Bowel: Partial right colectomy with ileostomy. Vascular/Lymphatic: Atherosclerosis is present, including aortoiliac atherosclerotic disease. No pathologic adenopathy identified. Reproductive: Unremarkable Other: There continues to be mild stranding along the right paracolic gutter and right psoas muscle margin, without overt  nodularity. Presumably this was related to prior abscess or tumor. Not appreciably changed from 03/05/2021. Musculoskeletal: Chronic 1.4 by 0.5 cm sclerotic lesion in the right iliac bone on image 52 of series 2 common no change from 11/15/2020, this may well have a  benign etiology although it is close to the prior complex process the previously involved the right iliacus muscle. The lack of substantial change over the intervening 6 months is somewhat reassuring. Bridging spurring of the sacroiliac joints. IMPRESSION: 1. Mostly similar appearance of the abdomen and pelvis compared to 03/05/2021. The dominant remaining index lesion in the liver has reduced in size further compared to that time and demonstrates fine calcifications, the other liver lesions likewise calcified and punctate. Continued stranding along the right paracolic gutter and right psoas muscle in the region of the prior complex process. 2. Subtle accentuated density along the right posterior urinary bladder, possibly from faint calcification along the wall of the bladder, a small bladder stone, or conceivably a small urothelial mass. Consider urology referral to exclude urothelial tumor. Admittedly the potential lesion is very subtle and surveillance of this region might be a viable alternative to cystoscopy. 3. Note is made of a small sclerotic lesion in the right iliac bone in fairly close proximity to the prior iliacus muscle lesion. This has not changed over the last 6 months and may well benign, but merit surveillance. Electronically Signed   By: Van Clines M.D.   On: 06/09/2021 17:21    Medications: I have reviewed the patient's current medications.   Assessment/Plan: Stage IV adenocarcinoma of the colon -11/15/2020 CT abdomen/pelvis with contrast-long segment masslike mural thickening of the cecum, posterior perforation at the site of the cecal wall thickening with multilocular right retroperitoneal abscess, indeterminate hypodensity in the inferior right lobe of the liver. -11/19/2020 CEA 0.8 -11/20/2020 MRI of the abdomen and pelvis with and without contrast-3.5 x 2.5 cm heterogeneous enhancing lesion in the inferior peripheral right liver with 2 smaller lesions with similar  features. -11/20/2020 ultrasound-guided liver biopsy consistent with adenocarcinoma.  Foundation 1-MSS, tumor mutation burden 8,KRAS wt -11/25/2020 CT abdomen/pelvis with contrast-interval placement of a right lower quadrant percutaneous pigtail drainage catheter with near resolution of the retroperitoneal fluid collection, redemonstrated large fungating mass at the cecal base measuring 11.9 x 9.1 x 8.4 cm with enlarged lymph nodes in the right lower quadrant mesocolon. -11/28/2020 exploratory laparotomy, right colectomy, ileostomy creation, cystoscopy with stent placement             -Surgical pathology showed adenocarcinoma, moderately differentiated, 12 cm, 0/14+ lymph nodes, lymphovascular space invasion present, radial margin involved by invasive adenocarcinoma, appendix with intraluminal             adenocarcinoma, pT4, PN 0 -12/09/2020 CT abdomen/pelvis with contrast-interval right colectomy and right lower quadrant ileostomy, stable right retroperitoneal soft tissue density along the psoas and iliacus muscles, mild progression of liver metastases since prior study. -Cycle 1 FOLFOX 12/24/2020 -Cycle 2 FOLFOX 01/06/2021 -Cycle 3 FOLFOX 01/20/2021 -Cycle 4 FOLFOX 02/03/2021 -Cycle 5 FOLFOX 02/17/2021 -Cycle 6 FOLFOX 03/03/2021 -Restaging CTs 03/05/2021- substantial interval decrease in size of predominantly hypodense lesions throughout the liver.  Significant interval resolution of previously seen extensive, heterogeneous soft tissue in the right retroperitoneal soft tissues and paracolic gutter overlying the psoas and iliacus muscles. -Cycle 7 FOLFOX 03/19/2021 -Cycle 8 FOLFOX 04/03/2021, oxaliplatin dose reduced secondary to neutropenia and thrombocytopenia -Cycle 9 FOLFOX 04/15/2021 -Cycle 10 FOLFOX 04/29/2021 -Cycle 11 FOLFOX 05/13/2021, oxaliplatin held secondary to neuropathy -  Cycle 12 FOLFOX 05/27/2021, oxaliplatin held secondary to neuropathy -CT abdomen/pelvis 06/09/2021-decrease size of remaining dominant  liver lesion, other liver lesions are calcified and punctate, subtle density in the posterior right bladder, unchanged sclerotic right iliac lesion -Cycle 13 FOLFOX 06/10/2021, oxaliplatin held secondary to neuropathy 2.  Sepsis secondary to perforated colon 3.  Retroperitoneal abscess 4.  Protein calorie malnutrition 5.  Thrombocytosis 6.  Iron deficiency anemia 7.  COVID-19 infection, 11/15/2020 8.  Tobacco abuse 9.  Family history of colon cancer 10.  Port-A-Cath placement, Dr. 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  I reviewed the restaging CT results and images with Eric [name] and his sister.  There is no evidence of disease progression.  The dominant liver lesion appears smaller.  The plan is to change to a maintenance regimen with capecitabine.  He will complete another cycle of infusional 5-FU today. Eric [name] will return for an office visit in 2 weeks.  The plan is to begin daily capecitabine then.  I will present his case at the GI tumor conference to discuss the possibility of hepatic directed therapy.  He will need a liver MRI if this is to be considered. I encouraged him to increase his intake of calorie supplements. [name], MD  06/10/2021  11:12 AM

## 2021-06-10 ENCOUNTER — Other Ambulatory Visit: Payer: Self-pay | Admitting: Oncology

## 2021-06-10 ENCOUNTER — Other Ambulatory Visit: Payer: Self-pay | Admitting: *Deleted

## 2021-06-10 ENCOUNTER — Inpatient Hospital Stay: Payer: Self-pay

## 2021-06-10 ENCOUNTER — Telehealth: Payer: Self-pay | Admitting: Pharmacy Technician

## 2021-06-10 ENCOUNTER — Encounter: Payer: Self-pay | Admitting: Oncology

## 2021-06-10 ENCOUNTER — Other Ambulatory Visit (HOSPITAL_COMMUNITY): Payer: Self-pay

## 2021-06-10 ENCOUNTER — Other Ambulatory Visit: Payer: Medicaid Other

## 2021-06-10 ENCOUNTER — Inpatient Hospital Stay (HOSPITAL_BASED_OUTPATIENT_CLINIC_OR_DEPARTMENT_OTHER): Payer: Self-pay | Admitting: Oncology

## 2021-06-10 VITALS — BP 112/90 | HR 100 | Temp 97.8°F | Resp 20 | Ht 75.0 in | Wt 140.6 lb

## 2021-06-10 DIAGNOSIS — K631 Perforation of intestine (nontraumatic): Secondary | ICD-10-CM

## 2021-06-10 DIAGNOSIS — C787 Secondary malignant neoplasm of liver and intrahepatic bile duct: Secondary | ICD-10-CM

## 2021-06-10 DIAGNOSIS — C189 Malignant neoplasm of colon, unspecified: Secondary | ICD-10-CM

## 2021-06-10 MED ORDER — PALONOSETRON HCL INJECTION 0.25 MG/5ML
0.2500 mg | Freq: Once | INTRAVENOUS | Status: AC
  Administered 2021-06-10: 0.25 mg via INTRAVENOUS
  Filled 2021-06-10: qty 5

## 2021-06-10 MED ORDER — GABAPENTIN 300 MG PO CAPS
ORAL_CAPSULE | ORAL | 1 refills | Status: DC
  Filled 2021-06-10: qty 90, 30d supply, fill #0

## 2021-06-10 MED ORDER — PROCHLORPERAZINE MALEATE 10 MG PO TABS
10.0000 mg | ORAL_TABLET | Freq: Four times a day (QID) | ORAL | 1 refills | Status: DC | PRN
  Filled 2021-06-10: qty 60, 15d supply, fill #0

## 2021-06-10 MED ORDER — FLUOROURACIL CHEMO INJECTION 5 GM/100ML
2400.0000 mg/m2 | INTRAVENOUS | Status: DC
  Administered 2021-06-10: 4450 mg via INTRAVENOUS
  Filled 2021-06-10: qty 89

## 2021-06-10 MED ORDER — DEXTROSE 5 % IV SOLN: Freq: Once | INTRAVENOUS | Status: AC

## 2021-06-10 MED ORDER — FLUOROURACIL CHEMO INJECTION 2.5 GM/50ML: 400.0000 mg/m2 | Freq: Once | INTRAVENOUS | Status: DC

## 2021-06-10 MED ORDER — LEUCOVORIN CALCIUM INJECTION 350 MG: 400.0000 mg/m2 | Freq: Once | INTRAVENOUS | Status: DC

## 2021-06-10 MED ORDER — PANTOPRAZOLE SODIUM 40 MG PO TBEC
40.0000 mg | DELAYED_RELEASE_TABLET | Freq: Every day | ORAL | 2 refills | Status: DC
  Filled 2021-06-10: qty 30, 30d supply, fill #0
  Filled 2021-07-09: qty 30, 30d supply, fill #1

## 2021-06-10 MED ORDER — OXALIPLATIN CHEMO INJECTION 100 MG/20ML: 65.0000 mg/m2 | Freq: Once | INTRAVENOUS | Status: DC

## 2021-06-10 MED ORDER — CAPECITABINE 500 MG PO TABS
ORAL_TABLET | ORAL | 0 refills | Status: DC
  Filled 2021-06-10: qty 120, fill #0

## 2021-06-10 MED ORDER — SODIUM CHLORIDE 0.9 % IV SOLN
10.0000 mg | Freq: Once | INTRAVENOUS | Status: AC
  Administered 2021-06-10: 10 mg via INTRAVENOUS
  Filled 2021-06-10: qty 1

## 2021-06-10 NOTE — Telephone Encounter (Addendum)
Oral Oncology Patient Advocate Encounter  Patient is currently uninsured. Medicaid Family Planning does not cover Xeloda.  Patient has been approved for the [employer] and Xeloda cost can be billed to the fund.  Patient's copay is $0.00.  [facility] Phone [phone] Fax [phone] 06/10/2021 4:28 PM

## 2021-06-10 NOTE — Patient Instructions (Signed)
Eric [name]   Discharge Instructions: Thank you for choosing [facility] to provide your oncology and hematology care.   If you have a lab appointment with the [facility], please go directly to the [facility] and check in at the registration area.   Wear comfortable clothing and clothing appropriate for easy access to any Portacath or PICC line.   We strive to give you quality time with your provider. You may need to reschedule your appointment if you arrive late (15 or more minutes).  Arriving late affects you and other patients whose appointments are after yours.  Also, if you miss three or more appointments without notifying the office, you may be dismissed from the clinic at the provider's discretion.      For prescription refill requests, have your pharmacy contact our office and allow 72 hours for refills to be completed.    Today you received the following chemotherapy and/or immunotherapy agents Flourouracil (ADRUCIL).      To help prevent nausea and vomiting after your treatment, we encourage you to take your nausea medication as directed.  BELOW ARE SYMPTOMS THAT SHOULD BE REPORTED IMMEDIATELY: *FEVER GREATER THAN 100.4 F (38 C) OR HIGHER *CHILLS OR SWEATING *NAUSEA AND VOMITING THAT IS NOT CONTROLLED WITH YOUR NAUSEA MEDICATION *UNUSUAL SHORTNESS OF BREATH *UNUSUAL BRUISING OR BLEEDING *URINARY PROBLEMS (pain or burning when urinating, or frequent urination) *BOWEL PROBLEMS (unusual diarrhea, constipation, pain near the anus) TENDERNESS IN MOUTH AND THROAT WITH OR WITHOUT PRESENCE OF ULCERS (sore throat, sores in mouth, or a toothache) UNUSUAL RASH, SWELLING OR PAIN  UNUSUAL VAGINAL DISCHARGE OR ITCHING   Items with * indicate a potential emergency and should be followed up as soon as possible or go to the Emergency Department if any problems should occur.  Please show the CHEMOTHERAPY ALERT CARD or IMMUNOTHERAPY ALERT CARD at  check-in to the Emergency Department and triage nurse.  Should you have questions after your visit or need to cancel or reschedule your appointment, please contact [facility]  Dept: [phone]  and follow the prompts.  Office hours are 8:00 a.m. to 4:30 p.m. Monday - Friday. Please note that voicemails left after 4:00 p.m. may not be returned until the following business day.  We are closed weekends and major holidays. You have access to a nurse at all times for urgent questions. Please call the main number to the clinic Dept: [phone] and follow the prompts.   For any non-urgent questions, you may also contact your provider using MyChart. We now offer e-Visits for anyone 70 and older to request care online for non-urgent symptoms. For details visit [email or web address].   Also download the MyChart app! Go to the app store, search "MyChart", open the app, select [facility], and log in with your MyChart username and password.  Due to Covid, a mask is required upon entering the hospital/clinic. If you do not have a mask, one will be given to you upon arrival. For doctor visits, patients may have 1 support person aged 55 or older with them. For treatment visits, patients cannot have anyone with them due to current Covid guidelines and our immunocompromised population.   Fluorouracil, 5-FU injection What is this medication? FLUOROURACIL, 5-FU (flure oh YOOR a sil) is a chemotherapy drug. It slows the growth of cancer cells. This medicine is used to treat many types of cancer like breast cancer, colon or rectal cancer, pancreatic cancer, and stomachcancer. This  medicine may be used for other purposes; ask your health care provider orpharmacist if you have questions. COMMON BRAND NAME(S): Adrucil What should I tell my care team before I take this medication? They need to know if you have any of these conditions: blood disorders dihydropyrimidine dehydrogenase (DPD)  deficiency infection (especially a virus infection such as chickenpox, cold sores, or herpes) kidney disease liver disease malnourished, poor nutrition recent or ongoing radiation therapy an unusual or allergic reaction to fluorouracil, other chemotherapy, other medicines, foods, dyes, or preservatives pregnant or trying to get pregnant breast-feeding How should I use this medication? This drug is given as an infusion or injection into a vein. It is administeredin a hospital or clinic by a specially trained health care professional. Talk to your pediatrician regarding the use of this medicine in children.Special care may be needed. Overdosage: If you think you have taken too much of this medicine contact apoison control center or emergency room at once. NOTE: This medicine is only for you. Do not share this medicine with others. What if I miss a dose? It is important not to miss your dose. Call your doctor or health careprofessional if you are unable to keep an appointment. What may interact with this medication? Do not take this medicine with any of the following medications: live virus vaccines This medicine may also interact with the following medications: medicines that treat or prevent blood clots like warfarin, enoxaparin, and dalteparin This list may not describe all possible interactions. Give your health care provider a list of all the medicines, herbs, non-prescription drugs, or dietary supplements you use. Also tell them if you smoke, drink alcohol, or use illegaldrugs. Some items may interact with your medicine. What should I watch for while using this medication? Visit your doctor for checks on your progress. This drug may make you feel generally unwell. This is not uncommon, as chemotherapy can affect healthy cells as well as cancer cells. Report any side effects. Continue your course oftreatment even though you feel ill unless your doctor tells you to stop. In some cases, you  may be given additional medicines to help with side effects.Follow all directions for their use. Call your doctor or health care professional for advice if you get a fever, chills or sore throat, or other symptoms of a cold or flu. Do not treat yourself. This drug decreases your body's ability to fight infections. Try toavoid being around people who are sick. This medicine may increase your risk to bruise or bleed. Call your doctor orhealth care professional if you notice any unusual bleeding. Be careful brushing and flossing your teeth or using a toothpick because you may get an infection or bleed more easily. If you have any dental work done,tell your dentist you are receiving this medicine. Avoid taking products that contain aspirin, acetaminophen, ibuprofen, naproxen, or ketoprofen unless instructed by your doctor. These medicines may hide afever. Do not become pregnant while taking this medicine. Women should inform their doctor if they wish to become pregnant or think they might be pregnant. There is a potential for serious side effects to an unborn child. Talk to your health care professional or pharmacist for more information. Do not breast-feed aninfant while taking this medicine. Men should inform their doctor if they wish to father a child. This medicinemay lower sperm counts. Do not treat diarrhea with over the counter products. Contact your doctor ifyou have diarrhea that lasts more than 2 days or if it is severe and watery.  This medicine can make you more sensitive to the sun. Keep out of the sun. If you cannot avoid being in the sun, wear protective clothing and use sunscreen.Do not use sun lamps or tanning beds/booths. What side effects may I notice from receiving this medication? Side effects that you should report to your doctor or health care professionalas soon as possible: allergic reactions like skin rash, itching or hives, swelling of the face, lips, or tongue low blood counts -  this medicine may decrease the number of white blood cells, red blood cells and platelets. You may be at increased risk for infections and bleeding. signs of infection - fever or chills, cough, sore throat, pain or difficulty passing urine signs of decreased platelets or bleeding - bruising, pinpoint red spots on the skin, black, tarry stools, blood in the urine signs of decreased red blood cells - unusually weak or tired, fainting spells, lightheadedness breathing problems changes in vision chest pain mouth sores nausea and vomiting pain, swelling, redness at site where injected pain, tingling, numbness in the hands or feet redness, swelling, or sores on hands or feet stomach pain unusual bleeding Side effects that usually do not require medical attention (report to yourdoctor or health care professional if they continue or are bothersome): changes in finger or toe nails diarrhea dry or itchy skin hair loss headache loss of appetite sensitivity of eyes to the light stomach upset unusually teary eyes This list may not describe all possible side effects. Call your doctor for medical advice about side effects. You may report side effects to FDA at1-800-FDA-1088. Where should I keep my medication? This drug is given in a hospital or clinic and will not be stored at home. NOTE: This sheet is a summary. It may not cover all possible information. If you have questions about this medicine, talk to your doctor, pharmacist, orhealth care provider.  2022 Elsevier/Gold Standard (2019-08-29 15:00:03)  The chemotherapy medication bag should finish at 46 hours, 96 hours, or 7 days. For example, if your pump is scheduled for 46 hours and it was put on at 4:00 p.m., it should finish at 2:00 p.m. the day it is scheduled to come off regardless of your appointment time.     Estimated time to finish at 11:00 a.m. on Thursday 06/12/2021.   If the display on your pump reads "Low Volume" and it is  beeping, take the batteries out of the pump and come to the cancer center for it to be taken off.   If the pump alarms go off prior to the pump reading "Low Volume" then call [phone] and someone can assist you.  If the plunger comes out and the chemotherapy medication is leaking out, please use your home chemo spill kit to clean up the spill. Do NOT use paper towels or other household products.  If you have problems or questions regarding your pump, please call either [phone] (24 hours a day) or the cancer center Monday-Friday 8:00 a.m.- 4:30 p.m. at the clinic number and we will assist you. If you are unable to get assistance, then go to the nearest Emergency Department and ask the staff to contact the IV team for assistance.

## 2021-06-10 NOTE — Addendum Note (Signed)
Addended by: [name] B on: 06/10/2021 01:26 PM   Modules accepted: Orders

## 2021-06-11 ENCOUNTER — Telehealth: Payer: Self-pay | Admitting: Pharmacist

## 2021-06-11 ENCOUNTER — Encounter: Payer: Self-pay | Admitting: Oncology

## 2021-06-11 DIAGNOSIS — C189 Malignant neoplasm of colon, unspecified: Secondary | ICD-10-CM

## 2021-06-11 DIAGNOSIS — C787 Secondary malignant neoplasm of liver and intrahepatic bile duct: Secondary | ICD-10-CM

## 2021-06-11 NOTE — Telephone Encounter (Signed)
Oral Oncology Pharmacist Encounter  Received new prescription for Xeloda (capecitabine) for the maintenance treatment of metastatic colon cancer, planned duration until disease progression or unacceptable drug toxicity. Planned start 06/25/21, along with next office visit.  CMP from 06/09/21 assessed, no relevant lab abnormalities. Prescription dose and frequency assessed.   Current medication list in Epic reviewed, one DDIs with capecitabine identified: - Pantoprazole: Proton Pump Inhibitors (PPI) may diminish the therapeutic effect of capecitabine, varying information on the clinical impact. Recommend evaluating the need for a PPI/acid suppression. If acid suppression is needed, attempt switching to a H2 antagonist (eg, famotidine) if possible.  Evaluated chart and no patient barriers to medication adherence identified.   Prescription has been e-scribed to the [facility] for benefits analysis and approval.  Oral Oncology Clinic will continue to follow for insurance authorization, copayment issues, initial counseling and start date.   Darl Pikes, PharmD, BCPS, BCOP, CPP Hematology/Oncology Clinical Pharmacist Practitioner [facility] [phone]  06/11/2021 1:19 PM

## 2021-06-11 NOTE — Addendum Note (Signed)
Addended by: Roselind Messier A on: 06/11/2021 04:04 PM   Modules accepted: Orders

## 2021-06-12 ENCOUNTER — Inpatient Hospital Stay: Payer: Self-pay | Attending: Oncology

## 2021-06-12 ENCOUNTER — Other Ambulatory Visit (HOSPITAL_COMMUNITY): Payer: Self-pay

## 2021-06-12 ENCOUNTER — Other Ambulatory Visit: Payer: Self-pay

## 2021-06-12 VITALS — BP 118/81 | HR 88 | Temp 98.0°F | Resp 18

## 2021-06-12 DIAGNOSIS — D509 Iron deficiency anemia, unspecified: Secondary | ICD-10-CM | POA: Insufficient documentation

## 2021-06-12 DIAGNOSIS — C787 Secondary malignant neoplasm of liver and intrahepatic bile duct: Secondary | ICD-10-CM | POA: Insufficient documentation

## 2021-06-12 DIAGNOSIS — C189 Malignant neoplasm of colon, unspecified: Secondary | ICD-10-CM

## 2021-06-12 DIAGNOSIS — C180 Malignant neoplasm of cecum: Secondary | ICD-10-CM | POA: Insufficient documentation

## 2021-06-12 DIAGNOSIS — D75839 Thrombocytosis, unspecified: Secondary | ICD-10-CM | POA: Insufficient documentation

## 2021-06-12 DIAGNOSIS — K631 Perforation of intestine (nontraumatic): Secondary | ICD-10-CM

## 2021-06-12 MED ORDER — CAPECITABINE 500 MG PO TABS
1000.0000 mg | ORAL_TABLET | Freq: Two times a day (BID) | ORAL | 0 refills | Status: DC
  Filled 2021-06-12 – 2021-06-19 (×2): qty 120, 30d supply, fill #0

## 2021-06-12 MED ORDER — SODIUM CHLORIDE 0.9% FLUSH
10.0000 mL | INTRAVENOUS | Status: DC | PRN
  Administered 2021-06-12: 10 mL

## 2021-06-12 MED ORDER — HEPARIN SOD (PORK) LOCK FLUSH 100 UNIT/ML IV SOLN
500.0000 [IU] | Freq: Once | INTRAVENOUS | Status: AC | PRN
  Administered 2021-06-12: 500 [IU]

## 2021-06-12 NOTE — Patient Instructions (Signed)
Implanted Port Home Guide An implanted port is a device that is placed under the skin. It is usually placed in the chest. The device can be used to give IV medicine, to take blood, or for dialysis. You may have an implanted port if: You need IV medicine that would be irritating to the small veins in your hands or arms. You need IV medicines, such as antibiotics, for a long period of time. You need IV nutrition for a long period of time. You need dialysis. When you have a port, your health care provider can choose to use the port instead of veins in your arms for these procedures. You may have fewer limitations when using a port than you would if you used other types of long-term IVs, and you will likely be able to return to normal activities after your incision heals. An implanted port has two main parts: Reservoir. The reservoir is the part where a needle is inserted to give medicines or draw blood. The reservoir is round. After it is placed, it appears as a small, raised area under your skin. Catheter. The catheter is a thin, flexible tube that connects the reservoir to a vein. Medicine that is inserted into the reservoir goes into the catheter and then into the vein. How is my port accessed? To access your port: A numbing cream may be placed on the skin over the port site. Your health care provider will put on a mask and sterile gloves. The skin over your port will be cleaned carefully with a germ-killing soap and allowed to dry. Your health care provider will gently pinch the port and insert a needle into it. Your health care provider will check for a blood return to make sure the port is in the vein and is not clogged. If your port needs to remain accessed to get medicine continuously (constant infusion), your health care provider will place a clear bandage (dressing) over the needle site. The dressing and needle will need to be changed every week, or as told by your health care provider. What  is flushing? Flushing helps keep the port from getting clogged. Follow instructions from your health care provider about how and when to flush the port. Ports are usually flushed with saline solution or a medicine called heparin. The need for flushing will depend on how the port is used: If the port is only used from time to time to give medicines or draw blood, the port may need to be flushed: Before and after medicines have been given. Before and after blood has been drawn. As part of routine maintenance. Flushing may be recommended every 4-6 weeks. If a constant infusion is running, the port may not need to be flushed. Throw away any syringes in a disposal container that is meant for sharp items (sharps container). You can buy a sharps container from a pharmacy, or you can make one by using an empty hard plastic bottle with a cover. How long will my port stay implanted? The port can stay in for as long as your health care provider thinks it is needed. When it is time for the port to come out, a surgery will be done to remove it. The surgery will be similar to the procedure that was done to put the port in. Follow these instructions at home:  Flush your port as told by your health care provider. If you need an infusion over several days, follow instructions from your health care provider about how   to take care of your port site. Make sure you: Wash your hands with soap and water before you change your dressing. If soap and water are not available, use alcohol-based hand sanitizer. Change your dressing as told by your health care provider. Place any used dressings or infusion bags into a plastic bag. Throw that bag in the trash. Keep the dressing that covers the needle clean and dry. Do not get it wet. Do not use scissors or sharp objects near the tube. Keep the tube clamped, unless it is being used. Check your port site every day for signs of infection. Check for: Redness, swelling, or  pain. Fluid or blood. Pus or a bad smell. Protect the skin around the port site. Avoid wearing bra straps that rub or irritate the site. Protect the skin around your port from seat belts. Place a soft pad over your chest if needed. Bathe or shower as told by your health care provider. The site may get wet as long as you are not actively receiving an infusion. Return to your normal activities as told by your health care provider. Ask your health care provider what activities are safe for you. Carry a medical alert card or wear a medical alert bracelet at all times. This will let health care providers know that you have an implanted port in case of an emergency. Get help right away if: You have redness, swelling, or pain at the port site. You have fluid or blood coming from your port site. You have pus or a bad smell coming from the port site. You have a fever. Summary Implanted ports are usually placed in the chest for long-term IV access. Follow instructions from your health care provider about flushing the port and changing bandages (dressings). Take care of the area around your port by avoiding clothing that puts pressure on the area, and by watching for signs of infection. Protect the skin around your port from seat belts. Place a soft pad over your chest if needed. Get help right away if you have a fever or you have redness, swelling, pain, drainage, or a bad smell at the port site. This information is not intended to replace advice given to you by your health care provider. Make sure you discuss any questions you have with your health care provider. Document Revised: 12/18/2020 Document Reviewed: 02/12/2020 Elsevier Patient Education  2022 [employer] Inc.  

## 2021-06-18 ENCOUNTER — Other Ambulatory Visit (HOSPITAL_COMMUNITY): Payer: Self-pay

## 2021-06-19 ENCOUNTER — Other Ambulatory Visit (HOSPITAL_COMMUNITY): Payer: Self-pay

## 2021-06-19 ENCOUNTER — Encounter: Payer: Self-pay | Admitting: Oncology

## 2021-06-20 ENCOUNTER — Other Ambulatory Visit (HOSPITAL_COMMUNITY): Payer: Self-pay

## 2021-06-20 ENCOUNTER — Ambulatory Visit (HOSPITAL_COMMUNITY)
Admission: RE | Admit: 2021-06-20 | Discharge: 2021-06-20 | Disposition: A | Discharge status: Home / Self Care | Payer: Self-pay | Source: Ambulatory Visit | Attending: Oncology | Admitting: Oncology

## 2021-06-20 DIAGNOSIS — Z79899 Other long term (current) drug therapy: Secondary | ICD-10-CM | POA: Insufficient documentation

## 2021-06-20 DIAGNOSIS — C189 Malignant neoplasm of colon, unspecified: Secondary | ICD-10-CM | POA: Insufficient documentation

## 2021-06-20 DIAGNOSIS — K9400 Colostomy complication, unspecified: Secondary | ICD-10-CM

## 2021-06-20 DIAGNOSIS — K9403 Colostomy malfunction: Secondary | ICD-10-CM | POA: Insufficient documentation

## 2021-06-20 NOTE — Telephone Encounter (Signed)
Oral Oncology Patient Advocate Encounter  I spoke with Mickel Baas, patients sister, yesterday to schedule Xeloda for pick up today 06/20/21.  Medication has been picked up.    [facility] will call 7-10 days before next refill is due to complete adherence call and schedule refill for pick up or delivery of medication.     [place] [facility] Phone [phone] Fax [phone] 06/20/2021 3:00 PM

## 2021-06-20 NOTE — Discharge Instructions (Signed)
WE switched to 2 1/4" pouch today. Use barrier ring Order medium belt I have flagged all the item numbers in the [employer] catalog for ordering  Follow up as needed.

## 2021-06-20 NOTE — Telephone Encounter (Signed)
Oral Chemotherapy Pharmacist Encounter  Mr. Grigorian plans on picking up his capecitabine from [facility] following an appt he has on 06/20/21. He know not to get started until 06/25/21.  Patient Education I spoke with patient on 06/19/21 for overview of new oral chemotherapy medication:Xeloda (capecitabine) for the maintenance treatment of metastatic colon cancer, planned duration until disease progression or unacceptable drug toxicity.   Pt is doing well. Counseled patient on administration, dosing, side effects, monitoring, drug-food interactions, safe handling, storage, and disposal. Patient will take 2 tablets (1,000 mg total) by mouth 2 (two) times daily after a meal.  Side effects include but not limited to: diarrhea, hand-foot syndrome, mouth sores, edema, decreased wbc, fatigue, N/V.    Reviewed with patient importance of keeping a medication schedule and plan for any missed doses.  After discussion with patient no patient barriers to medication adherence identified.   Mr. Risden voiced understanding and appreciation. All questions answered. Medication handout provided.  Provided patient with [facility] phone number. Patient knows to call the office with questions or concerns. [facility] will continue to follow.  Darl Pikes, PharmD, BCPS, BCOP, CPP Hematology/Oncology Clinical Pharmacist Practitioner [facility] [phone]  06/20/2021 2:12 PM

## 2021-06-20 NOTE — Progress Notes (Signed)
[name]   Reason for visit:  RLQ colostomy HPI:  Perforated colon cancer ROS  Review of Systems  Gastrointestinal:        Prolapsed stoma    Vital signs:  BP 122/88 (BP Location: Right Arm)   Pulse 82   Temp (!) 97.5 F (36.4 C) (Oral)   Resp 18   SpO2 97%  Exam:  Physical Exam Abdominal:     General: Abdomen is flat. A surgical scar is present. The ostomy site is clean.     Palpations: Abdomen is soft.  Neurological:     Mental Status: He is alert.     Comments: Numbness from chemotherapy.  Limited dexterity in hands.   Psychiatric:        Mood and Affect: Mood normal.    Stoma type/location:  RLQ colostomy Stomal assessment/size:  1 3/8" pink patent, prolapsed, producing soft green stool Peristomal assessment:  intact  scar along midline and umbilicus have been interfering with pouching. He has been taping the perimeter.  Treatment options for stomal/peristomal skin: Barrier ring and size down to 2 1/4" pouch. Add ostomy belt for additional support Output: soft green stool Ostomy pouching: 2pc.  2 1/4" pouch with barrier ring Education provided:  They agree that the smaller pouch will avoid the umbilicus and scar.  I explain rationale for barrier ring and ostomy belt.  Due to prolapsed stoma that moves in and out, the belt will give additional support.  The ostomy belt I have is too large for patient's 30" waist and wife will order the smaller one. I provide an Eric [name] book with all item numbers flagged  THey pay out of pocket for supplies and order from [facility].  I provide them with 3 additional pouch sets today.  I use barrier strips along the border for extra security until belt arrives.     Impression/dx  Colostomy complication Discussion  New pouch (smaller) barrier ring and belt.  Supplies and item numbers given.     Visit time: 50 minutes.   Domenic Moras FNP-BC

## 2021-06-23 ENCOUNTER — Other Ambulatory Visit (HOSPITAL_COMMUNITY): Payer: Self-pay

## 2021-06-25 ENCOUNTER — Inpatient Hospital Stay: Payer: Self-pay

## 2021-06-25 ENCOUNTER — Encounter: Payer: Self-pay | Admitting: Nurse Practitioner

## 2021-06-25 ENCOUNTER — Other Ambulatory Visit: Payer: Self-pay

## 2021-06-25 ENCOUNTER — Inpatient Hospital Stay (HOSPITAL_BASED_OUTPATIENT_CLINIC_OR_DEPARTMENT_OTHER): Payer: Self-pay | Admitting: Nurse Practitioner

## 2021-06-25 VITALS — BP 121/86 | HR 100 | Temp 98.0°F | Resp 18 | Ht 75.0 in | Wt 146.0 lb

## 2021-06-25 DIAGNOSIS — C189 Malignant neoplasm of colon, unspecified: Secondary | ICD-10-CM

## 2021-06-25 DIAGNOSIS — C787 Secondary malignant neoplasm of liver and intrahepatic bile duct: Secondary | ICD-10-CM

## 2021-06-25 DIAGNOSIS — K631 Perforation of intestine (nontraumatic): Secondary | ICD-10-CM

## 2021-06-25 LAB — CBC WITH DIFFERENTIAL (CANCER CENTER ONLY)
Abs Immature Granulocytes: 0.02 10*3/uL (ref 0.00–0.07)
Basophils Absolute: 0.1 10*3/uL (ref 0.0–0.1)
Basophils Relative: 1 %
Eosinophils Absolute: 0.8 10*3/uL — ABNORMAL HIGH (ref 0.0–0.5)
Eosinophils Relative: 12 %
HCT: 32.5 % — ABNORMAL LOW (ref 39.0–52.0)
Hemoglobin: 10.4 g/dL — ABNORMAL LOW (ref 13.0–17.0)
Immature Granulocytes: 0 %
Lymphocytes Relative: 18 %
Lymphs Abs: 1.2 10*3/uL (ref 0.7–4.0)
MCH: 29.2 pg (ref 26.0–34.0)
MCHC: 32 g/dL (ref 30.0–36.0)
MCV: 91.3 fL (ref 80.0–100.0)
Monocytes Absolute: 0.6 10*3/uL (ref 0.1–1.0)
Monocytes Relative: 9 %
Neutro Abs: 3.8 10*3/uL (ref 1.7–7.7)
Neutrophils Relative %: 60 %
Platelet Count: 125 10*3/uL — ABNORMAL LOW (ref 150–400)
RBC: 3.56 MIL/uL — ABNORMAL LOW (ref 4.22–5.81)
RDW: 18.9 % — ABNORMAL HIGH (ref 11.5–15.5)
WBC Count: 6.4 10*3/uL (ref 4.0–10.5)
nRBC: 0 % (ref 0.0–0.2)

## 2021-06-25 LAB — CMP (CANCER CENTER ONLY)
ALT: 17 U/L (ref 0–44)
AST: 22 U/L (ref 15–41)
Albumin: 3.8 g/dL (ref 3.5–5.0)
Alkaline Phosphatase: 147 U/L — ABNORMAL HIGH (ref 38–126)
Anion gap: 9 (ref 5–15)
BUN: 21 mg/dL — ABNORMAL HIGH (ref 6–20)
CO2: 23 mmol/L (ref 22–32)
Calcium: 9.3 mg/dL (ref 8.9–10.3)
Chloride: 107 mmol/L (ref 98–111)
Creatinine: 0.87 mg/dL (ref 0.61–1.24)
GFR, Estimated: >60 mL/min (ref >60)
Glucose, Bld: 125 mg/dL — ABNORMAL HIGH (ref 70–99)
Potassium: 3.7 mmol/L (ref 3.5–5.1)
Sodium: 139 mmol/L (ref 135–145)
Total Bilirubin: 0.5 mg/dL (ref 0.3–1.2)
Total Protein: 6.9 g/dL (ref 6.5–8.1)

## 2021-06-25 MED ORDER — DIAZEPAM 5 MG PO TABS: ORAL_TABLET | ORAL | 0 refills | Status: DC

## 2021-06-25 NOTE — Progress Notes (Signed)
The proposed treatment discussed in conference is for discussion purpose only and is not a binding recommendation.  The patients have not been physically examined, or presented with their treatment options.  Therefore, final treatment plans cannot be decided.  

## 2021-06-25 NOTE — Progress Notes (Signed)
[place] OFFICE PROGRESS NOTE   Diagnosis: Colon cancer  INTERVAL HISTORY:   Eric [name] returns as scheduled.  He completed another cycle of infusional 5-FU 06/10/2021.  He denies nausea/vomiting.  No mouth sores.  No diarrhea.  He continues to note numbness/tingling in the hands.  He only notes numbness/tingling in his feet if he is not wearing socks and shoes.  Objective:  Vital signs in last 24 hours:  Blood pressure 121/86, pulse 100, temperature 98 F (36.7 C), temperature source Oral, resp. rate 18, height _0  (1.905 m), weight 146 lb (66.2 kg), SpO2 100 %.    HEENT: No thrush or ulcers. Resp: Lungs clear bilaterally. Cardio: Regular rate and rhythm. GI: Abdomen soft and nontender.  No hepatomegaly.  Right lower quadrant ostomy. Vascular: No leg edema Skin: Palms dry appearing, hyperpigmentation noted. Port-A-Cath without erythema  Lab Results:  Lab Results  Component Value Date   WBC 6.4 06/25/2021   HGB 10.4 (L) 06/25/2021   HCT 32.5 (L) 06/25/2021   MCV 91.3 06/25/2021   PLT 125 (L) 06/25/2021   NEUTROABS 3.8 06/25/2021    Imaging:  No results found.  Medications: I have reviewed the patient's current medications.  Assessment/Plan: Stage IV adenocarcinoma of the colon -11/15/2020 CT abdomen/pelvis with contrast-long segment masslike mural thickening of the cecum, posterior perforation at the site of the cecal wall thickening with multilocular right retroperitoneal abscess, indeterminate hypodensity in the inferior right lobe of the liver. -11/19/2020 CEA 0.8 -11/20/2020 MRI of the abdomen and pelvis with and without contrast-3.5 x 2.5 cm heterogeneous enhancing lesion in the inferior peripheral right liver with 2 smaller lesions with similar features. -11/20/2020 ultrasound-guided liver biopsy consistent with adenocarcinoma.  Foundation 1-MSS, tumor mutation burden 8,KRAS wt -11/25/2020 CT abdomen/pelvis with contrast-interval placement of a right  lower quadrant percutaneous pigtail drainage catheter with near resolution of the retroperitoneal fluid collection, redemonstrated large fungating mass at the cecal base measuring 11.9 x 9.1 x 8.4 cm with enlarged lymph nodes in the right lower quadrant mesocolon. -11/28/2020 exploratory laparotomy, right colectomy, ileostomy creation, cystoscopy with stent placement             -Surgical pathology showed adenocarcinoma, moderately differentiated, 12 cm, 0/14+ lymph nodes, lymphovascular space invasion present, radial margin involved by invasive adenocarcinoma, appendix with intraluminal             adenocarcinoma, pT4, PN 0 -12/09/2020 CT abdomen/pelvis with contrast-interval right colectomy and right lower quadrant ileostomy, stable right retroperitoneal soft tissue density along the psoas and iliacus muscles, mild progression of liver metastases since prior study. -Cycle 1 FOLFOX 12/24/2020 -Cycle 2 FOLFOX 01/06/2021 -Cycle 3 FOLFOX 01/20/2021 -Cycle 4 FOLFOX 02/03/2021 -Cycle 5 FOLFOX 02/17/2021 -Cycle 6 FOLFOX 03/03/2021 -Restaging CTs 03/05/2021- substantial interval decrease in size of predominantly hypodense lesions throughout the liver.  Significant interval resolution of previously seen extensive, heterogeneous soft tissue in the right retroperitoneal soft tissues and paracolic gutter overlying the psoas and iliacus muscles. -Cycle 7 FOLFOX 03/19/2021 -Cycle 8 FOLFOX 04/03/2021, oxaliplatin dose reduced secondary to neutropenia and thrombocytopenia -Cycle 9 FOLFOX 04/15/2021 -Cycle 10 FOLFOX 04/29/2021 -Cycle 11 FOLFOX 05/13/2021, oxaliplatin held secondary to neuropathy -Cycle 12 FOLFOX 05/27/2021, oxaliplatin held secondary to neuropathy -CT abdomen/pelvis 06/09/2021-decrease size of remaining dominant liver lesion, other liver lesions are calcified and punctate, subtle density in the posterior right bladder, unchanged sclerotic right iliac lesion -Cycle 13 FOLFOX 06/10/2021, oxaliplatin held secondary to  neuropathy -Maintenance Xeloda 06/25/2021  2.  Sepsis secondary to  perforated colon 3.  Retroperitoneal abscess 4.  Protein calorie malnutrition 5.  Thrombocytosis 6.  Iron deficiency anemia 7.  COVID-19 infection, 11/15/2020 8.  Tobacco abuse 9.  Family history of colon cancer 10.  Port-A-Cath placement, Dr. 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  He began maintenance Xeloda this morning.  We again reviewed potential toxicities.  He agrees to proceed.  His case was presented at the GI tumor conference.  Recommendation for MRI liver to see if he is a candidate for ablation.  Order for MRI placed.  He will return for lab and follow-up in 3 weeks.   Patient seen with Dr. Benay Spice.   Ned Card ANP/GNP-BC   06/25/2021  12:13 PM  This was a shared visit with Ned Card.  I presented his case at the GI tumor conference earlier today.  He will be referred for an MRI of the liver to see whether he is a candidate for hepatic directed therapy.  He will begin maintenance capecitabine.  I was present greater than 50% of today's visit.  I performed medical decision making.  Julieanne Manson, MD

## 2021-07-03 ENCOUNTER — Ambulatory Visit (HOSPITAL_COMMUNITY)
Admission: RE | Admit: 2021-07-03 | Discharge: 2021-07-03 | Disposition: A | Discharge status: Home / Self Care | Payer: Self-pay | Source: Ambulatory Visit | Attending: Nurse Practitioner | Admitting: Nurse Practitioner

## 2021-07-03 DIAGNOSIS — K631 Perforation of intestine (nontraumatic): Secondary | ICD-10-CM | POA: Insufficient documentation

## 2021-07-03 DIAGNOSIS — C189 Malignant neoplasm of colon, unspecified: Secondary | ICD-10-CM | POA: Insufficient documentation

## 2021-07-03 DIAGNOSIS — C787 Secondary malignant neoplasm of liver and intrahepatic bile duct: Secondary | ICD-10-CM | POA: Insufficient documentation

## 2021-07-03 MED ORDER — GADOBUTROL 1 MMOL/ML IV SOLN
7.0000 mL | Freq: Once | INTRAVENOUS | Status: AC | PRN
  Administered 2021-07-03: 7 mL via INTRAVENOUS

## 2021-07-09 ENCOUNTER — Other Ambulatory Visit (HOSPITAL_COMMUNITY): Payer: Self-pay

## 2021-07-15 ENCOUNTER — Other Ambulatory Visit (HOSPITAL_COMMUNITY): Payer: Self-pay

## 2021-07-15 ENCOUNTER — Other Ambulatory Visit: Payer: Self-pay

## 2021-07-15 ENCOUNTER — Encounter: Payer: Self-pay | Admitting: Nurse Practitioner

## 2021-07-15 ENCOUNTER — Other Ambulatory Visit: Payer: Self-pay | Admitting: Oncology

## 2021-07-15 ENCOUNTER — Inpatient Hospital Stay: Payer: Self-pay

## 2021-07-15 ENCOUNTER — Inpatient Hospital Stay: Payer: Self-pay | Attending: Oncology | Admitting: Nurse Practitioner

## 2021-07-15 VITALS — BP 125/87 | HR 82 | Temp 97.8°F | Resp 18 | Ht 75.0 in | Wt 151.6 lb

## 2021-07-15 DIAGNOSIS — A4189 Other specified sepsis: Secondary | ICD-10-CM | POA: Insufficient documentation

## 2021-07-15 DIAGNOSIS — C787 Secondary malignant neoplasm of liver and intrahepatic bile duct: Secondary | ICD-10-CM | POA: Insufficient documentation

## 2021-07-15 DIAGNOSIS — Z9049 Acquired absence of other specified parts of digestive tract: Secondary | ICD-10-CM | POA: Insufficient documentation

## 2021-07-15 DIAGNOSIS — G620 Drug-induced polyneuropathy: Secondary | ICD-10-CM | POA: Insufficient documentation

## 2021-07-15 DIAGNOSIS — Z932 Ileostomy status: Secondary | ICD-10-CM | POA: Insufficient documentation

## 2021-07-15 DIAGNOSIS — Z79899 Other long term (current) drug therapy: Secondary | ICD-10-CM | POA: Insufficient documentation

## 2021-07-15 DIAGNOSIS — Z8616 Personal history of COVID-19: Secondary | ICD-10-CM | POA: Insufficient documentation

## 2021-07-15 DIAGNOSIS — D75839 Thrombocytosis, unspecified: Secondary | ICD-10-CM | POA: Insufficient documentation

## 2021-07-15 DIAGNOSIS — K6819 Other retroperitoneal abscess: Secondary | ICD-10-CM | POA: Insufficient documentation

## 2021-07-15 DIAGNOSIS — D509 Iron deficiency anemia, unspecified: Secondary | ICD-10-CM | POA: Insufficient documentation

## 2021-07-15 DIAGNOSIS — C189 Malignant neoplasm of colon, unspecified: Secondary | ICD-10-CM

## 2021-07-15 DIAGNOSIS — C180 Malignant neoplasm of cecum: Secondary | ICD-10-CM | POA: Insufficient documentation

## 2021-07-15 DIAGNOSIS — Z800 Family history of malignant neoplasm of digestive organs: Secondary | ICD-10-CM | POA: Insufficient documentation

## 2021-07-15 DIAGNOSIS — Z95828 Presence of other vascular implants and grafts: Secondary | ICD-10-CM

## 2021-07-15 DIAGNOSIS — T451X5A Adverse effect of antineoplastic and immunosuppressive drugs, initial encounter: Secondary | ICD-10-CM | POA: Insufficient documentation

## 2021-07-15 DIAGNOSIS — E46 Unspecified protein-calorie malnutrition: Secondary | ICD-10-CM | POA: Insufficient documentation

## 2021-07-15 DIAGNOSIS — Z933 Colostomy status: Secondary | ICD-10-CM | POA: Insufficient documentation

## 2021-07-15 DIAGNOSIS — K631 Perforation of intestine (nontraumatic): Secondary | ICD-10-CM | POA: Insufficient documentation

## 2021-07-15 LAB — CBC WITH DIFFERENTIAL (CANCER CENTER ONLY)
Abs Immature Granulocytes: 0.02 10*3/uL (ref 0.00–0.07)
Basophils Absolute: 0 10*3/uL (ref 0.0–0.1)
Basophils Relative: 1 %
Eosinophils Absolute: 0.5 10*3/uL (ref 0.0–0.5)
Eosinophils Relative: 9 %
HCT: 28.8 % — ABNORMAL LOW (ref 39.0–52.0)
Hemoglobin: 9.4 g/dL — ABNORMAL LOW (ref 13.0–17.0)
Immature Granulocytes: 0 %
Lymphocytes Relative: 21 %
Lymphs Abs: 1 10*3/uL (ref 0.7–4.0)
MCH: 30.3 pg (ref 26.0–34.0)
MCHC: 32.6 g/dL (ref 30.0–36.0)
MCV: 92.9 fL (ref 80.0–100.0)
Monocytes Absolute: 0.6 10*3/uL (ref 0.1–1.0)
Monocytes Relative: 13 %
Neutro Abs: 2.7 10*3/uL (ref 1.7–7.7)
Neutrophils Relative %: 56 %
Platelet Count: 151 10*3/uL (ref 150–400)
RBC: 3.1 MIL/uL — ABNORMAL LOW (ref 4.22–5.81)
RDW: 20 % — ABNORMAL HIGH (ref 11.5–15.5)
WBC Count: 4.8 10*3/uL (ref 4.0–10.5)
nRBC: 0 % (ref 0.0–0.2)

## 2021-07-15 LAB — CMP (CANCER CENTER ONLY)
ALT: 18 U/L (ref 0–44)
AST: 23 U/L (ref 15–41)
Albumin: 3.8 g/dL (ref 3.5–5.0)
Alkaline Phosphatase: 160 U/L — ABNORMAL HIGH (ref 38–126)
Anion gap: 8 (ref 5–15)
BUN: 16 mg/dL (ref 6–20)
CO2: 24 mmol/L (ref 22–32)
Calcium: 9.3 mg/dL (ref 8.9–10.3)
Chloride: 105 mmol/L (ref 98–111)
Creatinine: 0.81 mg/dL (ref 0.61–1.24)
GFR, Estimated: >60 mL/min (ref >60)
Glucose, Bld: 128 mg/dL — ABNORMAL HIGH (ref 70–99)
Potassium: 3.7 mmol/L (ref 3.5–5.1)
Sodium: 137 mmol/L (ref 135–145)
Total Bilirubin: 0.6 mg/dL (ref 0.3–1.2)
Total Protein: 6.7 g/dL (ref 6.5–8.1)

## 2021-07-15 MED ORDER — HEPARIN SOD (PORK) LOCK FLUSH 100 UNIT/ML IV SOLN
500.0000 [IU] | Freq: Once | INTRAVENOUS | Status: AC
  Administered 2021-07-15: 500 [IU] via INTRAVENOUS

## 2021-07-15 MED ORDER — CAPECITABINE 500 MG PO TABS
1000.0000 mg | ORAL_TABLET | Freq: Two times a day (BID) | ORAL | 0 refills | Status: DC
  Filled 2021-07-23: qty 120, 30d supply, fill #0

## 2021-07-15 MED ORDER — SODIUM CHLORIDE 0.9% FLUSH
10.0000 mL | Freq: Once | INTRAVENOUS | Status: AC
  Administered 2021-07-15: 10 mL via INTRAVENOUS

## 2021-07-15 NOTE — Progress Notes (Signed)
[place] OFFICE PROGRESS NOTE   Diagnosis: Colon cancer  INTERVAL HISTORY:   Eric [name] returns as scheduled.  He continues maintenance Xeloda.  He denies nausea/vomiting.  No mouth sores.  No diarrhea.  No hand or foot pain or redness.  Objective:  Vital signs in last 24 hours:  Blood pressure 125/87, pulse 82, temperature 97.8 F (36.6 C), temperature source Oral, resp. rate 18, height $RemoveBe'6\' 3"'cYJkaUXSI$  (1.905 m), weight 151 lb 9.6 oz (68.8 kg), SpO2 98 %.    HEENT: No thrush or ulcers. Resp: Lungs clear bilaterally. Cardio: Regular rate and rhythm. GI: No hepatomegaly.  Right abdomen ostomy. Vascular: No leg edema. Skin: Palms with mild hyperpigmentation, skin thickening.  No skin breakdown. Port-A-Cath without erythema.  Lab Results:  Lab Results  Component Value Date   WBC 4.8 07/15/2021   HGB 9.4 (L) 07/15/2021   HCT 28.8 (L) 07/15/2021   MCV 92.9 07/15/2021   PLT 151 07/15/2021   NEUTROABS 2.7 07/15/2021    Imaging:  No results found.  Medications: I have reviewed the patient's current medications.  Assessment/Plan: Stage IV adenocarcinoma of the colon -11/15/2020 CT abdomen/pelvis with contrast-long segment masslike mural thickening of the cecum, posterior perforation at the site of the cecal wall thickening with multilocular right retroperitoneal abscess, indeterminate hypodensity in the inferior right lobe of the liver. -11/19/2020 CEA 0.8 -11/20/2020 MRI of the abdomen and pelvis with and without contrast-3.5 x 2.5 cm heterogeneous enhancing lesion in the inferior peripheral right liver with 2 smaller lesions with similar features. -11/20/2020 ultrasound-guided liver biopsy consistent with adenocarcinoma.  Foundation 1-MSS, tumor mutation burden 8,KRAS wt -11/25/2020 CT abdomen/pelvis with contrast-interval placement of a right lower quadrant percutaneous pigtail drainage catheter with near resolution of the retroperitoneal fluid collection, redemonstrated  large fungating mass at the cecal base measuring 11.9 x 9.1 x 8.4 cm with enlarged lymph nodes in the right lower quadrant mesocolon. -11/28/2020 exploratory laparotomy, right colectomy, ileostomy creation, cystoscopy with stent placement             -Surgical pathology showed adenocarcinoma, moderately differentiated, 12 cm, 0/14+ lymph nodes, lymphovascular space invasion present, radial margin involved by invasive adenocarcinoma, appendix with intraluminal             adenocarcinoma, pT4, PN 0 -12/09/2020 CT abdomen/pelvis with contrast-interval right colectomy and right lower quadrant ileostomy, stable right retroperitoneal soft tissue density along the psoas and iliacus muscles, mild progression of liver metastases since prior study. -Cycle 1 FOLFOX 12/24/2020 -Cycle 2 FOLFOX 01/06/2021 -Cycle 3 FOLFOX 01/20/2021 -Cycle 4 FOLFOX 02/03/2021 -Cycle 5 FOLFOX 02/17/2021 -Cycle 6 FOLFOX 03/03/2021 -Restaging CTs 03/05/2021- substantial interval decrease in size of predominantly hypodense lesions throughout the liver.  Significant interval resolution of previously seen extensive, heterogeneous soft tissue in the right retroperitoneal soft tissues and paracolic gutter overlying the psoas and iliacus muscles. -Cycle 7 FOLFOX 03/19/2021 -Cycle 8 FOLFOX 04/03/2021, oxaliplatin dose reduced secondary to neutropenia and thrombocytopenia -Cycle 9 FOLFOX 04/15/2021 -Cycle 10 FOLFOX 04/29/2021 -Cycle 11 FOLFOX 05/13/2021, oxaliplatin held secondary to neuropathy -Cycle 12 FOLFOX 05/27/2021, oxaliplatin held secondary to neuropathy -CT abdomen/pelvis 06/09/2021-decrease size of remaining dominant liver lesion, other liver lesions are calcified and punctate, subtle density in the posterior right bladder, unchanged sclerotic right iliac lesion -Cycle 13 FOLFOX 06/10/2021, oxaliplatin held secondary to neuropathy -Maintenance Xeloda 06/25/2021 -MRI liver 07/03/2021-no significant change in subtle irregular nonenhancing remnant liver  lesions of the inferior right lobe of the liver measuring 1.2 x 0.6 cm and adjacent  to the gallbladder fossa measuring no greater than 0.5 cm. -Maintenance Xeloda continued   2.  Sepsis secondary to perforated colon 3.  Retroperitoneal abscess 4.  Protein calorie malnutrition 5.  Thrombocytosis 6.  Iron deficiency anemia 7.  COVID-19 infection, 11/15/2020 8.  Tobacco abuse 9.  Family history of colon cancer 10.  Port-A-Cath placement, Dr. 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  There is no clinical evidence of disease progression.  He will continue Xeloda.  We reviewed the recent MRI liver results/images with Eric [name] and his sister.  We are making a referral to Interventional Radiology to consider ablation.  His case will be presented at an upcoming GI tumor conference.  He is interested in having the ostomy reversed.  We will forward his request to Dr. Rosendo Gros.  He will return for lab and follow-up in approximately 3 weeks.  We are available to see him sooner if needed.  Patient seen with Dr. Benay Spice.   Ned Card ANP/GNP-BC   07/15/2021  11:55 AM This was a shared visit with Ned Card.  We reviewed the MRI findings and images with Eric [name].  We will refer him to interventional radiology to consider ablation of the remaining liver lesions. We will asked Dr. Rosendo Gros whether Eric [name] will be a candidate for ileostomy reversal.  I was present for greater than 50% of today's visit.  I performed medical decision making.  Julieanne Manson, MD

## 2021-07-16 ENCOUNTER — Other Ambulatory Visit (HOSPITAL_COMMUNITY): Payer: Self-pay

## 2021-07-22 ENCOUNTER — Encounter: Payer: Self-pay | Admitting: *Deleted

## 2021-07-22 ENCOUNTER — Ambulatory Visit
Admission: RE | Admit: 2021-07-22 | Discharge: 2021-07-22 | Disposition: A | Discharge status: Home / Self Care | Payer: Self-pay | Source: Ambulatory Visit | Attending: Nurse Practitioner | Admitting: Nurse Practitioner

## 2021-07-22 DIAGNOSIS — C189 Malignant neoplasm of colon, unspecified: Secondary | ICD-10-CM

## 2021-07-22 HISTORY — PX: IR RADIOLOGIST EVAL & MGMT: IMG5224

## 2021-07-22 NOTE — Consult Note (Signed)
Chief Complaint: Patient was seen in consultation today for potential liver directed therapy of metastatic disease  at the request of Thomas,Lisa K  Referring Physician(s): Wynne Dust, Izola Price  History of Present Illness: Eric [name] is a 50 y.o. male with stage IV colon cancer.  Patient initially presented with a perforated colonic neoplasm and underwent percutaneous drain placement and ultrasound-guided liver biopsy.  Patient subsequently had a right hemicolectomy and ileostomy creation.  Abdominal MRI on 11/20/2000 demonstrated a dominant right hepatic lesion measuring up to 3.5 cm and at least 2 additional small liver lesions.  Patient underwent chemotherapy and follow-up imaging has demonstrated excellent response to the treatment.  Follow-up imaging has demonstrated markedly decreased size of the hepatic lesions.  Patient was referred to Interventional Radiology to discuss possible liver directed therapy such as percutaneous ablation.  Patient complains of neuropathy in his hands but otherwise he is asymptomatic.  His appetite and weight are slowly improving.  He has no abdominal pain.  No GI or GU issues.  Patient is currently taking Xeloda.  No issues with his ostomy.  Patient is smoking half a pack per day.   Past Surgical History:  Procedure Laterality Date   CYSTOSCOPY WITH STENT PLACEMENT N/A 11/28/2020   Procedure: CYSTOSCOPY WITH STENT PLACEMENT;  Surgeon: Janith Lima, MD;  Location: [facility];  Service: Urology;  Laterality: N/A;   IR RADIOLOGIST EVAL & MGMT  07/22/2021   LAPAROTOMY N/A 11/28/2020   Procedure: EXPLORATORY LAPAROTOMY;  Surgeon: Ralene Ok, MD;  Location: [facility];  Service: General;  Laterality: N/A;  DOW CASE TO START AT 730AM 4 HOUR CASE   OSTOMY N/A 11/28/2020   Procedure: ILEOSTOMY CREATION;  Surgeon: Ralene Ok, MD;  Location: [facility];  Service: General;  Laterality: N/A;   PARTIAL COLECTOMY Right 11/28/2020   Procedure: RIGHT COLECTOMY;   Surgeon: Ralene Ok, MD;  Location: [facility];  Service: General;  Laterality: Right;   PORTACATH PLACEMENT Right 12/12/2020   Procedure: INSERTION PORT-A-CATH;  Surgeon: Dwan Bolt, MD;  Location: [facility];  Service: General;  Laterality: Right;    Allergies: Patient has no known allergies.  Medications: Prior to Admission medications   Medication Sig Start Date End Date Taking? Authorizing Provider  acetaminophen (TYLENOL) 500 MG tablet TAKE 2 TABLETS (1,000 MG TOTAL) BY MOUTH EVERY SIX HOURS. 12/13/20 12/13/21  Pahwani, Michell Heinrich, MD  capecitabine (XELODA) 500 MG tablet Take 2 tablets (1,000 mg total) by mouth 2 (two) times daily after a meal. 07/15/21   Ladell Pier, MD  diazepam (VALIUM) 5 MG tablet Take 1/2 tablet 30 to 60 minutes prior to MRI.  May repeat x1. 06/25/21   Owens Shark, NP  feeding supplement (ENSURE ENLIVE / ENSURE PLUS) LIQD Take 237 mLs by mouth 3 (three) times daily between meals. 12/13/20   Pahwani, Michell Heinrich, MD  gabapentin (NEURONTIN) 300 MG capsule TAKE 1 CAPSULE BY MOUTH THREE TIMES A DAY 06/10/21   Ladell Pier, MD  lidocaine-prilocaine (EMLA) cream Apply to port site 1-2 hours prior to stick and cover with plastic wrap to numb site as directed. 05/27/21   Ladell Pier, MD  ondansetron (ZOFRAN) 8 MG tablet TAKE 1 TABLET BY MOUTH EVERY 8 HOURS AS NEEDED FOR NAUSEA OR VOMITING. BEGIN [place] Patient not taking: Reported on 06/25/2021 05/27/21   Ladell Pier, MD  Oxycodone HCl 10 MG TABS Take 1/2 to 1 tablet by  mouth every 6 hours as needed (1/2 tablet for moderate pain, and 1 tablet for severe pain). 05/29/21   Ladell Pier, MD  pantoprazole (PROTONIX) 40 MG tablet Take 1 tablet (40 mg total) by mouth daily. 06/10/21   Ladell Pier, MD  polycarbophil (FIBERCON) 625 MG tablet Take 1 tablet (625 mg total) by mouth daily. 01/06/21   Ladell Pier, MD  prochlorperazine (COMPAZINE) 10 MG tablet Take 1 tablet (10 mg total) by mouth  every 6 (six) hours as needed for nausea. 06/10/21   Ladell Pier, MD     Family History  Problem Relation Age of Onset   Diabetes Sister     Social History   Socioeconomic History   Marital status: Single    Spouse name: Not on file   Number of children: Not on file   Years of education: Not on file   Highest education level: Not on file  Occupational History   Not on file  Tobacco Use   Smoking status: Every Day    Packs/day: 0.50    Years: 30.00    Pack years: 15.00    Types: Cigarettes   Smokeless tobacco: Never   Tobacco comments:    Up to 1.5 PPD since age 30  Substance and Sexual Activity   Alcohol use: Not Currently   Drug use: Yes    Types: Marijuana   Sexual activity: Not on file  Other Topics Concern   Not on file  Social History Narrative   Not on file   Social Determinants of Health   Financial Resource Strain: Medium Risk   Difficulty of Paying Living Expenses: Somewhat hard  Food Insecurity: No Food Insecurity   Worried About Running Out of Food in the Last Year: Never true   Ran Out of Food in the Last Year: Never true  Transportation Needs: No Transportation Needs   Lack of Transportation (Medical): No   Lack of Transportation (Non-Medical): No  Physical Activity: Not on file  Stress: Not on file  Social Connections: Not on file     Review of Systems  Constitutional: Negative.   Respiratory: Negative.    Cardiovascular: Negative.   Gastrointestinal: Negative.   Genitourinary: Negative.   Neurological:  Positive for numbness.   Vital Signs: There were no vitals taken for this visit.  Physical Exam Constitutional:      General: He is not in acute distress.    Appearance: Normal appearance. He is not ill-appearing.  Cardiovascular:     Rate and Rhythm: Normal rate and regular rhythm.  Pulmonary:     Effort: Pulmonary effort is normal.     Breath sounds: Normal breath sounds.  Abdominal:     General: Abdomen is flat. There is  no distension.     Palpations: Abdomen is soft.     Tenderness: There is no abdominal tenderness.  Neurological:     Mental Status: He is alert.     Imaging: MR LIVER W WO CONTRAST  Result Date: 07/05/2021 CLINICAL DATA:  Metastatic colon cancer, liver lesions, evaluate for ablation EXAM: MRI ABDOMEN WITHOUT AND WITH CONTRAST TECHNIQUE: Multiplanar multisequence MR imaging of the abdomen was performed both before and after the administration of intravenous contrast. CONTRAST:  48mL GADAVIST GADOBUTROL 1 MMOL/ML IV SOLN COMPARISON:  CT abdomen pelvis, 06/09/2021 FINDINGS: Lower chest: No acute findings. Hepatobiliary: No significant change in subtle, irregular nonenhancing remnant liver lesions, of the inferior right lobe of the liver, hepatic segment VI, measuring  1.2 x 0.6 cm (series 17, image 58), and adjacent to the gallbladder fossa, hepatic segment V, measuring no greater than 0.5 cm (series 17, image 60). No contrast enhancing mass or other parenchymal abnormality identified. Additional small incidental simple cysts. No gallstones. No biliary ductal dilatation. Pancreas: No mass, inflammatory changes, or other parenchymal abnormality identified. Spleen:  Within normal limits in size and appearance. Adrenals/Urinary Tract: No masses identified. No evidence of hydronephrosis. Stomach/Bowel: Status post right hemicolectomy with right lower quadrant diverting loop ileostomy. Vascular/Lymphatic: No pathologically enlarged lymph nodes identified. No abdominal aortic aneurysm demonstrated. Other:  None. Musculoskeletal: No suspicious bone lesions identified. IMPRESSION: 1. No significant change in subtle, irregular nonenhancing remnant liver lesions. No contrast enhancing mass or other parenchymal abnormality identified. These lesions are consistent with treated metastases. 2. Status post right hemicolectomy with right lower quadrant diverting loop ileostomy. Electronically Signed   By: Eddie Candle M.D.    On: 07/05/2021 14:07   IR Radiologist Eval & Mgmt  Result Date: 07/22/2021 Please refer to notes tab for details about interventional procedure. (Op Note)   Labs:  CBC: Recent Labs    05/27/21 1004 06/09/21 1020 06/25/21 1130 07/15/21 1140  WBC 6.5 6.5 6.4 4.8  HGB 10.6* 10.8* 10.4* 9.4*  HCT 32.8* 34.2* 32.5* 28.8*  PLT 127* 150 125* 151    COAGS: Recent Labs    11/15/20 1520  INR 1.2    BMP: Recent Labs    05/27/21 1004 06/09/21 1020 06/25/21 1130 07/15/21 1140  NA 135 137 139 137  K 3.7 3.9 3.7 3.7  CL 103 103 107 105  CO2 25 26 23 24   GLUCOSE 124* 100* 125* 128*  BUN 7 12 21* 16  CALCIUM 9.2 9.5 9.3 9.3  CREATININE 0.77 0.98 0.87 0.81  GFRNONAA >60 >60 >60 >60    LIVER FUNCTION TESTS: Recent Labs    05/27/21 1004 06/09/21 1020 06/25/21 1130 07/15/21 1140  BILITOT 0.6 0.6 0.5 0.6  AST 30 22 22 23   ALT 23 17 17 18   ALKPHOS 142* 140* 147* 160*  PROT 7.1 7.6 6.9 6.7  ALBUMIN 3.5 3.7 3.8 3.8    TUMOR MARKERS: Recent Labs    06/09/21 1020  CEA 2.47    Assessment and Plan:  50 year old with stage IV colon cancer.  Biopsy-proven liver metastatic disease.  When the patient was initially diagnosed, the MRI demonstrated at least 3 suspicious liver lesions.  I also reviewed a CT from 12/09/2020 and there were at least 5 conspicuous lesions at that time.  Most recent CT and MRI imaging demonstrates that the liver lesions have markedly decreased in size.  CT scan from 06/09/2021 demonstrates small calcifications at the treated hepatic lesions.  No suspicious enhancement on the MRI from 07/03/2021.  I do not see active liver disease at this time.  I reviewed this findings with the patient.  We could consider focal liver therapy with percutaneous microwave ablation to treat occult disease.  The only treated lesion that would even be a candidate for percutaneous ablation is the dominant lesion along the periphery of the right hepatic lobe.  At this time, this  right hepatic lesion has markedly decreased in size and it is calcified.  If this was a solitary lesion I would feel more strongly about treating this right hepatic lesion but since the patient had multiple lesions I think is reasonable to continue with close surveillance.  If there is clear progression of disease than we could consider liver  directed therapy in the future.  Patient is comfortable with close observation.  Therefore, we continue with close surveillance of the liver disease and consider liver directed therapy if needed in the future.  I believe the patient has good understanding of the potential liver directed therapies that we could offer if needed.   Thank you for this interesting consult.  I greatly enjoyed meeting Eric [name] and look forward to participating in their care.  A copy of this report was sent to the requesting provider on this date.  Electronically Signed: Burman Riis 07/22/2021, 4:05 PM   I spent a total of  15 Minutes   in face to face in clinical consultation, greater than 50% of which was counseling/coordinating care for metastatic colon cancer.

## 2021-07-23 ENCOUNTER — Other Ambulatory Visit (HOSPITAL_COMMUNITY): Payer: Self-pay

## 2021-07-24 ENCOUNTER — Other Ambulatory Visit (HOSPITAL_COMMUNITY): Payer: Self-pay

## 2021-08-05 ENCOUNTER — Other Ambulatory Visit (HOSPITAL_COMMUNITY): Payer: Self-pay

## 2021-08-05 ENCOUNTER — Inpatient Hospital Stay: Payer: Self-pay

## 2021-08-05 ENCOUNTER — Other Ambulatory Visit: Payer: Self-pay

## 2021-08-05 ENCOUNTER — Inpatient Hospital Stay (HOSPITAL_BASED_OUTPATIENT_CLINIC_OR_DEPARTMENT_OTHER): Payer: Self-pay | Admitting: Oncology

## 2021-08-05 VITALS — BP 125/86 | HR 76 | Temp 97.8°F | Resp 18 | Ht 75.0 in | Wt 154.0 lb

## 2021-08-05 DIAGNOSIS — C189 Malignant neoplasm of colon, unspecified: Secondary | ICD-10-CM

## 2021-08-05 DIAGNOSIS — Z95828 Presence of other vascular implants and grafts: Secondary | ICD-10-CM

## 2021-08-05 DIAGNOSIS — C787 Secondary malignant neoplasm of liver and intrahepatic bile duct: Secondary | ICD-10-CM

## 2021-08-05 DIAGNOSIS — Z23 Encounter for immunization: Secondary | ICD-10-CM

## 2021-08-05 LAB — CBC WITH DIFFERENTIAL (CANCER CENTER ONLY)
Abs Immature Granulocytes: 0.04 10*3/uL (ref 0.00–0.07)
Basophils Absolute: 0.1 10*3/uL (ref 0.0–0.1)
Basophils Relative: 1 %
Eosinophils Absolute: 0.4 10*3/uL (ref 0.0–0.5)
Eosinophils Relative: 7 %
HCT: 29.6 % — ABNORMAL LOW (ref 39.0–52.0)
Hemoglobin: 9.9 g/dL — ABNORMAL LOW (ref 13.0–17.0)
Immature Granulocytes: 1 %
Lymphocytes Relative: 20 %
Lymphs Abs: 1.3 10*3/uL (ref 0.7–4.0)
MCH: 32 pg (ref 26.0–34.0)
MCHC: 33.4 g/dL (ref 30.0–36.0)
MCV: 95.8 fL (ref 80.0–100.0)
Monocytes Absolute: 0.7 10*3/uL (ref 0.1–1.0)
Monocytes Relative: 11 %
Neutro Abs: 3.9 10*3/uL (ref 1.7–7.7)
Neutrophils Relative %: 60 %
Platelet Count: 178 10*3/uL (ref 150–400)
RBC: 3.09 MIL/uL — ABNORMAL LOW (ref 4.22–5.81)
RDW: 22.4 % — ABNORMAL HIGH (ref 11.5–15.5)
WBC Count: 6.4 10*3/uL (ref 4.0–10.5)
nRBC: 0 % (ref 0.0–0.2)

## 2021-08-05 LAB — CMP (CANCER CENTER ONLY)
ALT: 15 U/L (ref 0–44)
AST: 18 U/L (ref 15–41)
Albumin: 3.9 g/dL (ref 3.5–5.0)
Alkaline Phosphatase: 157 U/L — ABNORMAL HIGH (ref 38–126)
Anion gap: 7 (ref 5–15)
BUN: 14 mg/dL (ref 6–20)
CO2: 23 mmol/L (ref 22–32)
Calcium: 9.1 mg/dL (ref 8.9–10.3)
Chloride: 103 mmol/L (ref 98–111)
Creatinine: 0.89 mg/dL (ref 0.61–1.24)
GFR, Estimated: >60 mL/min (ref >60)
Glucose, Bld: 121 mg/dL — ABNORMAL HIGH (ref 70–99)
Potassium: 3.5 mmol/L (ref 3.5–5.1)
Sodium: 133 mmol/L — ABNORMAL LOW (ref 135–145)
Total Bilirubin: 0.6 mg/dL (ref 0.3–1.2)
Total Protein: 6.9 g/dL (ref 6.5–8.1)

## 2021-08-05 MED ORDER — PANTOPRAZOLE SODIUM 40 MG PO TBEC
40.0000 mg | DELAYED_RELEASE_TABLET | Freq: Every day | ORAL | 2 refills | Status: DC
  Filled 2021-08-05: qty 30, 30d supply, fill #0
  Filled 2021-09-21: qty 30, 30d supply, fill #1

## 2021-08-05 MED ORDER — GABAPENTIN 300 MG PO CAPS
ORAL_CAPSULE | ORAL | 1 refills | Status: DC
  Filled 2021-08-05: qty 90, 30d supply, fill #0
  Filled 2021-09-21: qty 90, 30d supply, fill #1

## 2021-08-05 MED ORDER — SODIUM CHLORIDE 0.9% FLUSH
10.0000 mL | Freq: Once | INTRAVENOUS | Status: AC
  Administered 2021-08-05: 10 mL via INTRAVENOUS

## 2021-08-05 MED ORDER — OXYCODONE HCL 10 MG PO TABS
5.0000 mg | ORAL_TABLET | Freq: Four times a day (QID) | ORAL | 0 refills | Status: DC | PRN
  Filled 2021-08-05: qty 25, 6d supply, fill #0

## 2021-08-05 MED ORDER — PROCHLORPERAZINE MALEATE 10 MG PO TABS
10.0000 mg | ORAL_TABLET | Freq: Four times a day (QID) | ORAL | 1 refills | Status: DC | PRN
  Filled 2021-08-05: qty 60, 15d supply, fill #0
  Filled 2021-09-21: qty 60, 15d supply, fill #1

## 2021-08-05 MED ORDER — ONDANSETRON HCL 8 MG PO TABS
ORAL_TABLET | ORAL | 1 refills | Status: DC
  Filled 2021-08-05: qty 30, 10d supply, fill #0
  Filled 2021-09-21: qty 30, 10d supply, fill #1

## 2021-08-05 MED ORDER — HEPARIN SOD (PORK) LOCK FLUSH 100 UNIT/ML IV SOLN
500.0000 [IU] | Freq: Once | INTRAVENOUS | Status: AC
  Administered 2021-08-05: 500 [IU] via INTRAVENOUS

## 2021-08-05 MED ORDER — INFLUENZA VAC SPLIT QUAD 0.5 ML IM SUSY: 0.5000 mL | PREFILLED_SYRINGE | Freq: Once | INTRAMUSCULAR | Status: DC

## 2021-08-05 NOTE — Patient Instructions (Signed)
Implanted Port Home Guide An implanted port is a device that is placed under the skin. It is usually placed in the chest. The device can be used to give IV medicine, to take blood, or for dialysis. You may have an implanted port if: You need IV medicine that would be irritating to the small veins in your hands or arms. You need IV medicines, such as antibiotics, for a long period of time. You need IV nutrition for a long period of time. You need dialysis. When you have a port, your health care provider can choose to use the port instead of veins in your arms for these procedures. You may have fewer limitations when using a port than you would if you used other types of long-term IVs, and you will likely be able to return to normal activities after your incision heals. An implanted port has two main parts: Reservoir. The reservoir is the part where a needle is inserted to give medicines or draw blood. The reservoir is round. After it is placed, it appears as a small, raised area under your skin. Catheter. The catheter is a thin, flexible tube that connects the reservoir to a vein. Medicine that is inserted into the reservoir goes into the catheter and then into the vein. How is my port accessed? To access your port: A numbing cream may be placed on the skin over the port site. Your health care provider will put on a mask and sterile gloves. The skin over your port will be cleaned carefully with a germ-killing soap and allowed to dry. Your health care provider will gently pinch the port and insert a needle into it. Your health care provider will check for a blood return to make sure the port is in the vein and is not clogged. If your port needs to remain accessed to get medicine continuously (constant infusion), your health care provider will place a clear bandage (dressing) over the needle site. The dressing and needle will need to be changed every week, or as told by your health care provider. What  is flushing? Flushing helps keep the port from getting clogged. Follow instructions from your health care provider about how and when to flush the port. Ports are usually flushed with saline solution or a medicine called heparin. The need for flushing will depend on how the port is used: If the port is only used from time to time to give medicines or draw blood, the port may need to be flushed: Before and after medicines have been given. Before and after blood has been drawn. As part of routine maintenance. Flushing may be recommended every 4-6 weeks. If a constant infusion is running, the port may not need to be flushed. Throw away any syringes in a disposal container that is meant for sharp items (sharps container). You can buy a sharps container from a pharmacy, or you can make one by using an empty hard plastic bottle with a cover. How long will my port stay implanted? The port can stay in for as long as your health care provider thinks it is needed. When it is time for the port to come out, a surgery will be done to remove it. The surgery will be similar to the procedure that was done to put the port in. Follow these instructions at home:  Flush your port as told by your health care provider. If you need an infusion over several days, follow instructions from your health care provider about how   to take care of your port site. Make sure you: Wash your hands with soap and water before you change your dressing. If soap and water are not available, use alcohol-based hand sanitizer. Change your dressing as told by your health care provider. Place any used dressings or infusion bags into a plastic bag. Throw that bag in the trash. Keep the dressing that covers the needle clean and dry. Do not get it wet. Do not use scissors or sharp objects near the tube. Keep the tube clamped, unless it is being used. Check your port site every day for signs of infection. Check for: Redness, swelling, or  pain. Fluid or blood. Pus or a bad smell. Protect the skin around the port site. Avoid wearing bra straps that rub or irritate the site. Protect the skin around your port from seat belts. Place a soft pad over your chest if needed. Bathe or shower as told by your health care provider. The site may get wet as long as you are not actively receiving an infusion. Return to your normal activities as told by your health care provider. Ask your health care provider what activities are safe for you. Carry a medical alert card or wear a medical alert bracelet at all times. This will let health care providers know that you have an implanted port in case of an emergency. Get help right away if: You have redness, swelling, or pain at the port site. You have fluid or blood coming from your port site. You have pus or a bad smell coming from the port site. You have a fever. Summary Implanted ports are usually placed in the chest for long-term IV access. Follow instructions from your health care provider about flushing the port and changing bandages (dressings). Take care of the area around your port by avoiding clothing that puts pressure on the area, and by watching for signs of infection. Protect the skin around your port from seat belts. Place a soft pad over your chest if needed. Get help right away if you have a fever or you have redness, swelling, pain, drainage, or a bad smell at the port site. This information is not intended to replace advice given to you by your health care provider. Make sure you discuss any questions you have with your health care provider. Document Revised: 12/18/2020 Document Reviewed: 02/12/2020 Elsevier Patient Education  2022 [employer] Inc.  

## 2021-08-05 NOTE — Progress Notes (Signed)
Eric [name] OFFICE PROGRESS NOTE   Diagnosis: Colon cancer  INTERVAL HISTORY:   Mr. Rafferty returns as scheduled.  He continues Xeloda maintenance.  No mouth sores, nausea, or diarrhea.  He has "cracking "of the skin at the hands and feet.  Good appetite and energy level.  He is scheduled to see Dr. Rosendo Gros later today to discuss reversal of the colostomy.  He was evaluated in interventional radiology to consider ablation of liver lesions.  No "active "lesions were noted on review of imaging studies.  Surveillance was recommended.  Objective:  Vital signs in last 24 hours:  Blood pressure 125/86, pulse 76, temperature 97.8 F (36.6 C), temperature source Oral, resp. rate 18, height $RemoveBe'6\' 3"'NNWycLxzo$  (1.905 m), weight 154 lb (69.9 kg), SpO2 100 %.    HEENT: No thrush or ulcers Resp: Lungs clear bilaterally Cardio: Regular rate and rhythm GI: No hepatosplenomegaly, right lower quadrant colostomy Vascular: No leg edema  Skin: Hyperpigmentation, skin thickening, and superficial linear cracks at the palms and soles  Portacath/PICC-without erythema  Lab Results:  Lab Results  Component Value Date   WBC 6.4 08/05/2021   HGB 9.9 (L) 08/05/2021   HCT 29.6 (L) 08/05/2021   MCV 95.8 08/05/2021   PLT 178 08/05/2021   NEUTROABS 3.9 08/05/2021    CMP  Lab Results  Component Value Date   NA 137 07/15/2021   K 3.7 07/15/2021   CL 105 07/15/2021   CO2 24 07/15/2021   GLUCOSE 128 (H) 07/15/2021   BUN 16 07/15/2021   CREATININE 0.81 07/15/2021   CALCIUM 9.3 07/15/2021   PROT 6.7 07/15/2021   ALBUMIN 3.8 07/15/2021   AST 23 07/15/2021   ALT 18 07/15/2021   ALKPHOS 160 (H) 07/15/2021   BILITOT 0.6 07/15/2021   GFRNONAA >60 07/15/2021    Lab Results  Component Value Date   CEA1 0.8 11/19/2020   CEA 2.47 06/09/2021    Medications: I have reviewed the patient's current medications.   Assessment/Plan: Stage IV adenocarcinoma of the colon -11/15/2020 CT abdomen/pelvis  with contrast-long segment masslike mural thickening of the cecum, posterior perforation at the site of the cecal wall thickening with multilocular right retroperitoneal abscess, indeterminate hypodensity in the inferior right lobe of the liver. -11/19/2020 CEA 0.8 -11/20/2020 MRI of the abdomen and pelvis with and without contrast-3.5 x 2.5 cm heterogeneous enhancing lesion in the inferior peripheral right liver with 2 smaller lesions with similar features. -11/20/2020 ultrasound-guided liver biopsy consistent with adenocarcinoma.  Foundation 1-MSS, tumor mutation burden 8,KRAS wt -11/25/2020 CT abdomen/pelvis with contrast-interval placement of a right lower quadrant percutaneous pigtail drainage catheter with near resolution of the retroperitoneal fluid collection, redemonstrated large fungating mass at the cecal base measuring 11.9 x 9.1 x 8.4 cm with enlarged lymph nodes in the right lower quadrant mesocolon. -11/28/2020 exploratory laparotomy, right colectomy, ileostomy creation, cystoscopy with stent placement             -Surgical pathology showed adenocarcinoma, moderately differentiated, 12 cm, 0/14+ lymph nodes, lymphovascular space invasion present, radial margin involved by invasive adenocarcinoma, appendix with intraluminal             adenocarcinoma, pT4, PN 0 -12/09/2020 CT abdomen/pelvis with contrast-interval right colectomy and right lower quadrant ileostomy, stable right retroperitoneal soft tissue density along the psoas and iliacus muscles, mild progression of liver metastases since prior study. -Cycle 1 FOLFOX 12/24/2020 -Cycle 2 FOLFOX 01/06/2021 -Cycle 3 FOLFOX 01/20/2021 -Cycle 4 FOLFOX 02/03/2021 -Cycle 5 FOLFOX 02/17/2021 -Cycle 6  FOLFOX 03/03/2021 -Restaging CTs 03/05/2021- substantial interval decrease in size of predominantly hypodense lesions throughout the liver.  Significant interval resolution of previously seen extensive, heterogeneous soft tissue in the right retroperitoneal soft  tissues and paracolic gutter overlying the psoas and iliacus muscles. -Cycle 7 FOLFOX 03/19/2021 -Cycle 8 FOLFOX 04/03/2021, oxaliplatin dose reduced secondary to neutropenia and thrombocytopenia -Cycle 9 FOLFOX 04/15/2021 -Cycle 10 FOLFOX 04/29/2021 -Cycle 11 FOLFOX 05/13/2021, oxaliplatin held secondary to neuropathy -Cycle 12 FOLFOX 05/27/2021, oxaliplatin held secondary to neuropathy -CT abdomen/pelvis 06/09/2021-decrease size of remaining dominant liver lesion, other liver lesions are calcified and punctate, subtle density in the posterior right bladder, unchanged sclerotic right iliac lesion -Cycle 13 FOLFOX 06/10/2021, oxaliplatin held secondary to neuropathy -Maintenance Xeloda 06/25/2021 -MRI liver 07/03/2021-no significant change in subtle irregular nonenhancing remnant liver lesions of the inferior right lobe of the liver measuring 1.2 x 0.6 cm and adjacent to the gallbladder fossa measuring no greater than 0.5 cm. -Maintenance Xeloda continued -Xeloda decreased to 1000 mg a.m., 500 mg p.m. 08/05/2021   2.  Sepsis secondary to perforated colon 3.  Retroperitoneal abscess 4.  Protein calorie malnutrition 5.  Thrombocytosis 6.  Iron deficiency anemia 7.  COVID-19 infection, 11/15/2020 8.  Tobacco abuse 9.  Family history of colon cancer 10.  Port-A-Cath placement, Dr. 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 Tosh appears unchanged.  He has hand/foot symptoms secondary to Xeloda.  We encouraged him to increase the use of moisturizers.  We decreased the Xeloda dose today.  He will return for an office and lab visit in 3 weeks.  He is scheduled to see Dr. Rosendo Gros later today to discuss takedown of the colostomy.  We can hold the Xeloda for a week before and a few weeks after surgery.    [name], MD  08/05/2021  9:03 AM

## 2021-08-05 NOTE — Patient Instructions (Signed)
Reduce dose of Xeloda chemo pills to 1000 mg in morning and 500 mg in evening.  Moisturize hands/feet at least twice daily--try Udderly Smooth or any lotion with Urea in it.

## 2021-08-06 ENCOUNTER — Other Ambulatory Visit: Payer: Self-pay

## 2021-08-06 ENCOUNTER — Other Ambulatory Visit (HOSPITAL_COMMUNITY): Payer: Self-pay

## 2021-08-06 NOTE — Progress Notes (Signed)
The proposed treatment discussed in conference is for discussion purpose only and is not a binding recommendation.  The patients have not been physically examined, or presented with their treatment options.  Therefore, final treatment plans cannot be decided.  

## 2021-08-12 ENCOUNTER — Other Ambulatory Visit: Payer: Self-pay | Admitting: Oncology

## 2021-08-12 ENCOUNTER — Other Ambulatory Visit (HOSPITAL_COMMUNITY): Payer: Self-pay

## 2021-08-12 DIAGNOSIS — C189 Malignant neoplasm of colon, unspecified: Secondary | ICD-10-CM

## 2021-08-13 ENCOUNTER — Other Ambulatory Visit (HOSPITAL_COMMUNITY): Payer: Self-pay

## 2021-08-14 ENCOUNTER — Other Ambulatory Visit (HOSPITAL_COMMUNITY): Payer: Self-pay

## 2021-08-14 MED ORDER — CAPECITABINE 500 MG PO TABS
ORAL_TABLET | ORAL | 0 refills | Status: DC
  Filled 2021-08-14 – 2021-08-19 (×2): qty 90, 30d supply, fill #0

## 2021-08-19 ENCOUNTER — Other Ambulatory Visit (HOSPITAL_COMMUNITY): Payer: Self-pay

## 2021-08-26 ENCOUNTER — Inpatient Hospital Stay: Payer: Self-pay

## 2021-08-26 ENCOUNTER — Other Ambulatory Visit: Payer: Self-pay

## 2021-08-26 ENCOUNTER — Inpatient Hospital Stay: Payer: 59 | Attending: Oncology | Admitting: Nurse Practitioner

## 2021-08-26 ENCOUNTER — Encounter: Payer: Self-pay | Admitting: Nurse Practitioner

## 2021-08-26 ENCOUNTER — Telehealth: Payer: Self-pay

## 2021-08-26 VITALS — BP 121/88 | HR 88 | Temp 98.7°F | Resp 18 | Ht 75.0 in | Wt 155.2 lb

## 2021-08-26 DIAGNOSIS — C189 Malignant neoplasm of colon, unspecified: Secondary | ICD-10-CM

## 2021-08-26 DIAGNOSIS — C787 Secondary malignant neoplasm of liver and intrahepatic bile duct: Secondary | ICD-10-CM

## 2021-08-26 DIAGNOSIS — Z23 Encounter for immunization: Secondary | ICD-10-CM

## 2021-08-26 DIAGNOSIS — Z95828 Presence of other vascular implants and grafts: Secondary | ICD-10-CM

## 2021-08-26 DIAGNOSIS — C181 Malignant neoplasm of appendix: Secondary | ICD-10-CM | POA: Insufficient documentation

## 2021-08-26 LAB — CMP (CANCER CENTER ONLY)
ALT: 24 U/L (ref 0–44)
AST: 25 U/L (ref 15–41)
Albumin: 4.1 g/dL (ref 3.5–5.0)
Alkaline Phosphatase: 143 U/L — ABNORMAL HIGH (ref 38–126)
Anion gap: 8 (ref 5–15)
BUN: 11 mg/dL (ref 6–20)
CO2: 25 mmol/L (ref 22–32)
Calcium: 9.6 mg/dL (ref 8.9–10.3)
Chloride: 102 mmol/L (ref 98–111)
Creatinine: 0.97 mg/dL (ref 0.61–1.24)
GFR, Estimated: >60 mL/min (ref >60)
Glucose, Bld: 127 mg/dL — ABNORMAL HIGH (ref 70–99)
Potassium: 3.4 mmol/L — ABNORMAL LOW (ref 3.5–5.1)
Sodium: 135 mmol/L (ref 135–145)
Total Bilirubin: 0.9 mg/dL (ref 0.3–1.2)
Total Protein: 7.5 g/dL (ref 6.5–8.1)

## 2021-08-26 LAB — CBC WITH DIFFERENTIAL (CANCER CENTER ONLY)
Abs Immature Granulocytes: 0.05 10*3/uL (ref 0.00–0.07)
Basophils Absolute: 0.1 10*3/uL (ref 0.0–0.1)
Basophils Relative: 1 %
Eosinophils Absolute: 0.5 10*3/uL (ref 0.0–0.5)
Eosinophils Relative: 4 %
HCT: 33.1 % — ABNORMAL LOW (ref 39.0–52.0)
Hemoglobin: 11.1 g/dL — ABNORMAL LOW (ref 13.0–17.0)
Immature Granulocytes: 1 %
Lymphocytes Relative: 12 %
Lymphs Abs: 1.3 10*3/uL (ref 0.7–4.0)
MCH: 33 pg (ref 26.0–34.0)
MCHC: 33.5 g/dL (ref 30.0–36.0)
MCV: 98.5 fL (ref 80.0–100.0)
Monocytes Absolute: 0.9 10*3/uL (ref 0.1–1.0)
Monocytes Relative: 9 %
Neutro Abs: 7.4 10*3/uL (ref 1.7–7.7)
Neutrophils Relative %: 73 %
Platelet Count: 174 10*3/uL (ref 150–400)
RBC: 3.36 MIL/uL — ABNORMAL LOW (ref 4.22–5.81)
RDW: 21 % — ABNORMAL HIGH (ref 11.5–15.5)
WBC Count: 10.2 10*3/uL (ref 4.0–10.5)
nRBC: 0 % (ref 0.0–0.2)

## 2021-08-26 MED ORDER — INFLUENZA VAC SPLIT QUAD 0.5 ML IM SUSY
0.5000 mL | PREFILLED_SYRINGE | Freq: Once | INTRAMUSCULAR | Status: AC
  Administered 2021-08-26: 0.5 mL via INTRAMUSCULAR
  Filled 2021-08-26: qty 0.5

## 2021-08-26 MED ORDER — SODIUM CHLORIDE 0.9% FLUSH
10.0000 mL | Freq: Once | INTRAVENOUS | Status: AC
  Administered 2021-08-26: 10 mL via INTRAVENOUS

## 2021-08-26 MED ORDER — HEPARIN SOD (PORK) LOCK FLUSH 100 UNIT/ML IV SOLN
500.0000 [IU] | Freq: Once | INTRAVENOUS | Status: AC
  Administered 2021-08-26: 500 [IU] via INTRAVENOUS

## 2021-08-26 NOTE — Progress Notes (Signed)
[facility] OFFICE PROGRESS NOTE   Diagnosis: Colon cancer  INTERVAL HISTORY:   Mr. Asper returns as scheduled.  He overall feels well.  He continue Xeloda.  He notes hands feel better on the decreased dose of Xeloda.  No nausea or vomiting.  No mouth sores.  No diarrhea.  He reports a good appetite.  He denies pain.  Objective:  Vital signs in last 24 hours:  Blood pressure 121/88, pulse 88, temperature 98.7 F (37.1 C), temperature source Oral, resp. rate 18, height $RemoveBe'6\' 3"'HTaLkAcsT$  (1.905 m), weight 155 lb 3.2 oz (70.4 kg), SpO2 98 %.    HEENT: No thrush or ulcers. Resp: Lungs clear bilaterally. Cardio: Regular rate and rhythm. GI: Abdomen soft and nontender.  No hepatomegaly.  Right lower quadrant colostomy. Vascular: No leg edema. Skin: Palms with hyperpigmentation and skin thickening.  Soles dry appearing. Port-A-Cath without erythema.   Lab Results:  Lab Results  Component Value Date   WBC 10.2 08/26/2021   HGB 11.1 (L) 08/26/2021   HCT 33.1 (L) 08/26/2021   MCV 98.5 08/26/2021   PLT 174 08/26/2021   NEUTROABS 7.4 08/26/2021    Imaging:  No results found.  Medications: I have reviewed the patient's current medications.  Assessment/Plan: Stage IV adenocarcinoma of the colon -11/15/2020 CT abdomen/pelvis with contrast-long segment masslike mural thickening of the cecum, posterior perforation at the site of the cecal wall thickening with multilocular right retroperitoneal abscess, indeterminate hypodensity in the inferior right lobe of the liver. -11/19/2020 CEA 0.8 -11/20/2020 MRI of the abdomen and pelvis with and without contrast-3.5 x 2.5 cm heterogeneous enhancing lesion in the inferior peripheral right liver with 2 smaller lesions with similar features. -11/20/2020 ultrasound-guided liver biopsy consistent with adenocarcinoma.  Foundation 1-MSS, tumor mutation burden 8,KRAS wt -11/25/2020 CT abdomen/pelvis with contrast-interval placement of a right lower  quadrant percutaneous pigtail drainage catheter with near resolution of the retroperitoneal fluid collection, redemonstrated large fungating mass at the cecal base measuring 11.9 x 9.1 x 8.4 cm with enlarged lymph nodes in the right lower quadrant mesocolon. -11/28/2020 exploratory laparotomy, right colectomy, ileostomy creation, cystoscopy with stent placement             -Surgical pathology showed adenocarcinoma, moderately differentiated, 12 cm, 0/14+ lymph nodes, lymphovascular space invasion present, radial margin involved by invasive adenocarcinoma, appendix with intraluminal             adenocarcinoma, pT4, PN 0 -12/09/2020 CT abdomen/pelvis with contrast-interval right colectomy and right lower quadrant ileostomy, stable right retroperitoneal soft tissue density along the psoas and iliacus muscles, mild progression of liver metastases since prior study. -Cycle 1 FOLFOX 12/24/2020 -Cycle 2 FOLFOX 01/06/2021 -Cycle 3 FOLFOX 01/20/2021 -Cycle 4 FOLFOX 02/03/2021 -Cycle 5 FOLFOX 02/17/2021 -Cycle 6 FOLFOX 03/03/2021 -Restaging CTs 03/05/2021- substantial interval decrease in size of predominantly hypodense lesions throughout the liver.  Significant interval resolution of previously seen extensive, heterogeneous soft tissue in the right retroperitoneal soft tissues and paracolic gutter overlying the psoas and iliacus muscles. -Cycle 7 FOLFOX 03/19/2021 -Cycle 8 FOLFOX 04/03/2021, oxaliplatin dose reduced secondary to neutropenia and thrombocytopenia -Cycle 9 FOLFOX 04/15/2021 -Cycle 10 FOLFOX 04/29/2021 -Cycle 11 FOLFOX 05/13/2021, oxaliplatin held secondary to neuropathy -Cycle 12 FOLFOX 05/27/2021, oxaliplatin held secondary to neuropathy -CT abdomen/pelvis 06/09/2021-decrease size of remaining dominant liver lesion, other liver lesions are calcified and punctate, subtle density in the posterior right bladder, unchanged sclerotic right iliac lesion -Cycle 13 FOLFOX 06/10/2021, oxaliplatin held secondary to  neuropathy -Maintenance Xeloda 06/25/2021 -MRI  liver 07/03/2021-no significant change in subtle irregular nonenhancing remnant liver lesions of the inferior right lobe of the liver measuring 1.2 x 0.6 cm and adjacent to the gallbladder fossa measuring no greater than 0.5 cm. -Maintenance Xeloda continued -Tumor conference 08/06/2021-rescan at a 31-month interval -Xeloda decreased to 1000 mg a.m., 500 mg p.m. 08/05/2021 (due to hand-foot syndrome)   2.  Sepsis secondary to perforated colon 3.  Retroperitoneal abscess 4.  Protein calorie malnutrition 5.  Thrombocytosis 6.  Iron deficiency anemia 7.  COVID-19 infection, 11/15/2020 8.  Tobacco abuse 9.  Family history of colon cancer 10.  Port-A-Cath placement, Dr. 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 Cisnero appears stable.  There is no clinical evidence of disease progression.  He will continue maintenance Xeloda.  Plan for reimaging at a 83-month interval.    The hand/foot symptoms are better.  He will continue to apply moisturizer.  CBC from today reviewed.  Counts adequate to continue with Xeloda.  Mr. Dierks requests we contact the surgeon's office to notify them he now has insurance.  He would like to discuss colostomy reversal.  We will contact Dr. Johney Frame office today.  He will receive the influenza vaccine today.  He will return for lab and follow-up in 3 weeks.    Ned Card ANP/GNP-BC   08/26/2021  8:18 AM

## 2021-08-26 NOTE — Patient Instructions (Signed)

## 2021-08-26 NOTE — Telephone Encounter (Signed)
[facility] spoke with the [place] (kim) to schedule an appointment for [name]. Maudie Mercury verified his [employer] and the provider do not accept [facility]. No appointment was made.

## 2021-08-26 NOTE — Telephone Encounter (Signed)
Called Eric [name] to let him know that [facility] does not accept his new insurance [name]).And I was not able to schedule his appointment

## 2021-09-15 ENCOUNTER — Other Ambulatory Visit (HOSPITAL_COMMUNITY): Payer: Self-pay

## 2021-09-15 ENCOUNTER — Other Ambulatory Visit: Payer: Self-pay | Admitting: Oncology

## 2021-09-15 DIAGNOSIS — C189 Malignant neoplasm of colon, unspecified: Secondary | ICD-10-CM

## 2021-09-16 ENCOUNTER — Inpatient Hospital Stay: Payer: 59 | Attending: Oncology | Admitting: Oncology

## 2021-09-16 ENCOUNTER — Telehealth: Payer: Self-pay

## 2021-09-16 ENCOUNTER — Inpatient Hospital Stay: Payer: 59

## 2021-09-16 ENCOUNTER — Other Ambulatory Visit: Payer: Self-pay

## 2021-09-16 ENCOUNTER — Other Ambulatory Visit: Payer: Self-pay | Admitting: Nurse Practitioner

## 2021-09-16 ENCOUNTER — Other Ambulatory Visit (HOSPITAL_COMMUNITY): Payer: Self-pay

## 2021-09-16 VITALS — BP 123/86 | HR 100 | Temp 98.0°F | Resp 18 | Ht 75.0 in | Wt 152.6 lb

## 2021-09-16 DIAGNOSIS — C787 Secondary malignant neoplasm of liver and intrahepatic bile duct: Secondary | ICD-10-CM | POA: Diagnosis not present

## 2021-09-16 DIAGNOSIS — C184 Malignant neoplasm of transverse colon: Secondary | ICD-10-CM | POA: Insufficient documentation

## 2021-09-16 DIAGNOSIS — A419 Sepsis, unspecified organism: Secondary | ICD-10-CM | POA: Insufficient documentation

## 2021-09-16 DIAGNOSIS — D75839 Thrombocytosis, unspecified: Secondary | ICD-10-CM | POA: Diagnosis not present

## 2021-09-16 DIAGNOSIS — Z800 Family history of malignant neoplasm of digestive organs: Secondary | ICD-10-CM | POA: Insufficient documentation

## 2021-09-16 DIAGNOSIS — Z79899 Other long term (current) drug therapy: Secondary | ICD-10-CM | POA: Diagnosis not present

## 2021-09-16 DIAGNOSIS — R0600 Dyspnea, unspecified: Secondary | ICD-10-CM | POA: Diagnosis not present

## 2021-09-16 DIAGNOSIS — D509 Iron deficiency anemia, unspecified: Secondary | ICD-10-CM | POA: Insufficient documentation

## 2021-09-16 DIAGNOSIS — G620 Drug-induced polyneuropathy: Secondary | ICD-10-CM | POA: Insufficient documentation

## 2021-09-16 DIAGNOSIS — C189 Malignant neoplasm of colon, unspecified: Secondary | ICD-10-CM

## 2021-09-16 DIAGNOSIS — K631 Perforation of intestine (nontraumatic): Secondary | ICD-10-CM | POA: Diagnosis not present

## 2021-09-16 DIAGNOSIS — Z9049 Acquired absence of other specified parts of digestive tract: Secondary | ICD-10-CM | POA: Diagnosis not present

## 2021-09-16 DIAGNOSIS — T451X5A Adverse effect of antineoplastic and immunosuppressive drugs, initial encounter: Secondary | ICD-10-CM | POA: Insufficient documentation

## 2021-09-16 DIAGNOSIS — E46 Unspecified protein-calorie malnutrition: Secondary | ICD-10-CM | POA: Insufficient documentation

## 2021-09-16 DIAGNOSIS — K6819 Other retroperitoneal abscess: Secondary | ICD-10-CM | POA: Diagnosis not present

## 2021-09-16 DIAGNOSIS — Z95828 Presence of other vascular implants and grafts: Secondary | ICD-10-CM

## 2021-09-16 DIAGNOSIS — C181 Malignant neoplasm of appendix: Secondary | ICD-10-CM | POA: Diagnosis present

## 2021-09-16 DIAGNOSIS — Z8616 Personal history of COVID-19: Secondary | ICD-10-CM | POA: Insufficient documentation

## 2021-09-16 DIAGNOSIS — J449 Chronic obstructive pulmonary disease, unspecified: Secondary | ICD-10-CM | POA: Insufficient documentation

## 2021-09-16 LAB — CBC WITH DIFFERENTIAL (CANCER CENTER ONLY)
Abs Immature Granulocytes: 0.03 10*3/uL (ref 0.00–0.07)
Basophils Absolute: 0.1 10*3/uL (ref 0.0–0.1)
Basophils Relative: 1 %
Eosinophils Absolute: 0.5 10*3/uL (ref 0.0–0.5)
Eosinophils Relative: 6 %
HCT: 34.2 % — ABNORMAL LOW (ref 39.0–52.0)
Hemoglobin: 11.6 g/dL — ABNORMAL LOW (ref 13.0–17.0)
Immature Granulocytes: 0 %
Lymphocytes Relative: 19 %
Lymphs Abs: 1.5 10*3/uL (ref 0.7–4.0)
MCH: 33.9 pg (ref 26.0–34.0)
MCHC: 33.9 g/dL (ref 30.0–36.0)
MCV: 100 fL (ref 80.0–100.0)
Monocytes Absolute: 0.8 10*3/uL (ref 0.1–1.0)
Monocytes Relative: 10 %
Neutro Abs: 5.4 10*3/uL (ref 1.7–7.7)
Neutrophils Relative %: 64 %
Platelet Count: 197 10*3/uL (ref 150–400)
RBC: 3.42 MIL/uL — ABNORMAL LOW (ref 4.22–5.81)
RDW: 18.7 % — ABNORMAL HIGH (ref 11.5–15.5)
WBC Count: 8.3 10*3/uL (ref 4.0–10.5)
nRBC: 0 % (ref 0.0–0.2)

## 2021-09-16 LAB — CMP (CANCER CENTER ONLY)
ALT: 14 U/L (ref 0–44)
AST: 18 U/L (ref 15–41)
Albumin: 4.6 g/dL (ref 3.5–5.0)
Alkaline Phosphatase: 165 U/L — ABNORMAL HIGH (ref 38–126)
Anion gap: 10 (ref 5–15)
BUN: 17 mg/dL (ref 6–20)
CO2: 24 mmol/L (ref 22–32)
Calcium: 10 mg/dL (ref 8.9–10.3)
Chloride: 98 mmol/L (ref 98–111)
Creatinine: 1.13 mg/dL (ref 0.61–1.24)
GFR, Estimated: >60 mL/min (ref >60)
Glucose, Bld: 126 mg/dL — ABNORMAL HIGH (ref 70–99)
Potassium: 3.8 mmol/L (ref 3.5–5.1)
Sodium: 132 mmol/L — ABNORMAL LOW (ref 135–145)
Total Bilirubin: 1.2 mg/dL (ref 0.3–1.2)
Total Protein: 7.7 g/dL (ref 6.5–8.1)

## 2021-09-16 MED ORDER — CAPECITABINE 500 MG PO TABS
ORAL_TABLET | ORAL | 0 refills | Status: DC
  Filled 2021-09-16 – 2021-09-21 (×2): qty 90, fill #0
  Filled 2021-09-26: qty 90, 30d supply, fill #0

## 2021-09-16 MED ORDER — HEPARIN SOD (PORK) LOCK FLUSH 100 UNIT/ML IV SOLN
500.0000 [IU] | Freq: Once | INTRAVENOUS | Status: AC
  Administered 2021-09-16: 500 [IU] via INTRAVENOUS

## 2021-09-16 MED ORDER — SODIUM CHLORIDE 0.9% FLUSH
10.0000 mL | Freq: Once | INTRAVENOUS | Status: AC
  Administered 2021-09-16: 10 mL via INTRAVENOUS

## 2021-09-16 MED ORDER — OXYCODONE HCL 10 MG PO TABS
5.0000 mg | ORAL_TABLET | Freq: Four times a day (QID) | ORAL | 0 refills | Status: DC | PRN
  Filled 2021-09-16: qty 25, 7d supply, fill #0

## 2021-09-16 NOTE — Progress Notes (Signed)
[facility] OFFICE PROGRESS NOTE   Diagnosis: Colon cancer  INTERVAL HISTORY:   Eric [name] returns as scheduled.  He continues Xeloda maintenance.  He continues to have dryness of the hands and feet.  Stable neuropathy symptoms.  No pain.  Good appetite.  Objective:  Vital signs in last 24 hours:  Blood pressure 123/86, pulse 100, temperature 98 F (36.7 C), temperature source Oral, resp. rate 18, height _0  (1.905 m), weight 152 lb 9.6 oz (69.2 kg), SpO2 100 %.    HEENT: No thrush or ulcers Resp: Lungs clear bilaterally Cardio: Regular rate and rhythm GI: No hepatomegaly, right abdomen colostomy Vascular: No leg edema  Skin: Dryness, hyperpigmentation, and superficial desquamation over the palms and soles  Portacath/PICC-without erythema  Lab Results:  Lab Results  Component Value Date   WBC 8.3 09/16/2021   HGB 11.6 (L) 09/16/2021   HCT 34.2 (L) 09/16/2021   MCV 100.0 09/16/2021   PLT 197 09/16/2021   NEUTROABS 5.4 09/16/2021    CMP  Lab Results  Component Value Date   NA 132 (L) 09/16/2021   K 3.8 09/16/2021   CL 98 09/16/2021   CO2 24 09/16/2021   GLUCOSE 126 (H) 09/16/2021   BUN 17 09/16/2021   CREATININE 1.13 09/16/2021   CALCIUM 10.0 09/16/2021   PROT 7.7 09/16/2021   ALBUMIN 4.6 09/16/2021   AST 18 09/16/2021   ALT 14 09/16/2021   ALKPHOS 165 (H) 09/16/2021   BILITOT 1.2 09/16/2021   GFRNONAA >60 09/16/2021    Lab Results  Component Value Date   CEA1 0.8 11/19/2020   CEA 2.47 06/09/2021      Medications: I have reviewed the patient's current medications.   Assessment/Plan: Stage IV adenocarcinoma of the colon -11/15/2020 CT abdomen/pelvis with contrast-long segment masslike mural thickening of the cecum, posterior perforation at the site of the cecal wall thickening with multilocular right retroperitoneal abscess, indeterminate hypodensity in the inferior right lobe of the liver. -11/19/2020 CEA 0.8 -11/20/2020 MRI of the  abdomen and pelvis with and without contrast-3.5 x 2.5 cm heterogeneous enhancing lesion in the inferior peripheral right liver with 2 smaller lesions with similar features. -11/20/2020 ultrasound-guided liver biopsy consistent with adenocarcinoma.  Foundation 1-MSS, tumor mutation burden 8,KRAS wt -11/25/2020 CT abdomen/pelvis with contrast-interval placement of a right lower quadrant percutaneous pigtail drainage catheter with near resolution of the retroperitoneal fluid collection, redemonstrated large fungating mass at the cecal base measuring 11.9 x 9.1 x 8.4 cm with enlarged lymph nodes in the right lower quadrant mesocolon. -11/28/2020 exploratory laparotomy, right colectomy, ileostomy creation, cystoscopy with stent placement             -Surgical pathology showed adenocarcinoma, moderately differentiated, 12 cm, 0/14+ lymph nodes, lymphovascular space invasion present, radial margin involved by invasive adenocarcinoma, appendix with intraluminal             adenocarcinoma, pT4, PN 0 -12/09/2020 CT abdomen/pelvis with contrast-interval right colectomy and right lower quadrant ileostomy, stable right retroperitoneal soft tissue density along the psoas and iliacus muscles, mild progression of liver metastases since prior study. -Cycle 1 FOLFOX 12/24/2020 -Cycle 2 FOLFOX 01/06/2021 -Cycle 3 FOLFOX 01/20/2021 -Cycle 4 FOLFOX 02/03/2021 -Cycle 5 FOLFOX 02/17/2021 -Cycle 6 FOLFOX 03/03/2021 -Restaging CTs 03/05/2021- substantial interval decrease in size of predominantly hypodense lesions throughout the liver.  Significant interval resolution of previously seen extensive, heterogeneous soft tissue in the right retroperitoneal soft tissues and paracolic gutter overlying the psoas and iliacus muscles. -Cycle 7 FOLFOX  03/19/2021 -Cycle 8 FOLFOX 04/03/2021, oxaliplatin dose reduced secondary to neutropenia and thrombocytopenia -Cycle 9 FOLFOX 04/15/2021 -Cycle 10 FOLFOX 04/29/2021 -Cycle 11 FOLFOX 05/13/2021, oxaliplatin  held secondary to neuropathy -Cycle 12 FOLFOX 05/27/2021, oxaliplatin held secondary to neuropathy -CT abdomen/pelvis 06/09/2021-decrease size of remaining dominant liver lesion, other liver lesions are calcified and punctate, subtle density in the posterior right bladder, unchanged sclerotic right iliac lesion -Cycle 13 FOLFOX 06/10/2021, oxaliplatin held secondary to neuropathy -Maintenance Xeloda 06/25/2021 -MRI liver 07/03/2021-no significant change in subtle irregular nonenhancing remnant liver lesions of the inferior right lobe of the liver measuring 1.2 x 0.6 cm and adjacent to the gallbladder fossa measuring no greater than 0.5 cm. -Maintenance Xeloda continued -Tumor conference 08/06/2021-rescan at a 58-monthinterval -Xeloda decreased to 1000 mg a.m., 500 mg p.m. 08/05/2021 (due to hand-foot syndrome)   2.  Sepsis secondary to perforated colon 3.  Retroperitoneal abscess 4.  Protein calorie malnutrition 5.  Thrombocytosis 6.  Iron deficiency anemia 7.  COVID-19 infection, 11/15/2020 8.  Tobacco abuse 9.  Family history of colon cancer 10.  Port-A-Cath placement, Dr. 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  He will continue Xeloda maintenance.  He has stable changes of hand/foot syndrome.  He is tolerating this well.  He will continue moisturizers.  He plans to contact Dr. RRosendo Grosto schedule an appointment for colostomy reversal.  Eric [name] return for an office visit in 3 weeks.  He will be scheduled for a restaging MRI of the liver in January.  GBetsy Coder MD  09/16/2021  10:08 AM

## 2021-09-16 NOTE — Telephone Encounter (Signed)
Spoke with pt regarding his refill request during his office visit. Pt aware and verbalized understanding that he has refills left on his current prescriptions.

## 2021-09-18 ENCOUNTER — Other Ambulatory Visit (HOSPITAL_COMMUNITY): Payer: Self-pay

## 2021-09-22 ENCOUNTER — Other Ambulatory Visit (HOSPITAL_COMMUNITY): Payer: Self-pay

## 2021-09-22 ENCOUNTER — Emergency Department (HOSPITAL_COMMUNITY): Payer: 59

## 2021-09-22 ENCOUNTER — Other Ambulatory Visit: Payer: Self-pay

## 2021-09-22 ENCOUNTER — Encounter (HOSPITAL_COMMUNITY): Payer: Self-pay

## 2021-09-22 ENCOUNTER — Inpatient Hospital Stay (HOSPITAL_COMMUNITY)
Admission: EM | Admit: 2021-09-22 | Discharge: 2021-09-24 | DRG: 683 | Disposition: A | Discharge status: Home / Self Care | Payer: 59 | Attending: Internal Medicine | Admitting: Internal Medicine

## 2021-09-22 DIAGNOSIS — J449 Chronic obstructive pulmonary disease, unspecified: Secondary | ICD-10-CM | POA: Diagnosis present

## 2021-09-22 DIAGNOSIS — Z681 Body mass index (BMI) 19 or less, adult: Secondary | ICD-10-CM

## 2021-09-22 DIAGNOSIS — N179 Acute kidney failure, unspecified: Secondary | ICD-10-CM | POA: Diagnosis not present

## 2021-09-22 DIAGNOSIS — R0602 Shortness of breath: Secondary | ICD-10-CM | POA: Diagnosis not present

## 2021-09-22 DIAGNOSIS — C787 Secondary malignant neoplasm of liver and intrahepatic bile duct: Secondary | ICD-10-CM | POA: Diagnosis present

## 2021-09-22 DIAGNOSIS — E860 Dehydration: Secondary | ICD-10-CM | POA: Diagnosis present

## 2021-09-22 DIAGNOSIS — C189 Malignant neoplasm of colon, unspecified: Secondary | ICD-10-CM | POA: Diagnosis present

## 2021-09-22 DIAGNOSIS — Z720 Tobacco use: Secondary | ICD-10-CM | POA: Diagnosis present

## 2021-09-22 DIAGNOSIS — I10 Essential (primary) hypertension: Secondary | ICD-10-CM | POA: Diagnosis present

## 2021-09-22 DIAGNOSIS — Z833 Family history of diabetes mellitus: Secondary | ICD-10-CM

## 2021-09-22 DIAGNOSIS — Z79899 Other long term (current) drug therapy: Secondary | ICD-10-CM

## 2021-09-22 DIAGNOSIS — Z933 Colostomy status: Secondary | ICD-10-CM

## 2021-09-22 DIAGNOSIS — Z9049 Acquired absence of other specified parts of digestive tract: Secondary | ICD-10-CM

## 2021-09-22 DIAGNOSIS — R0609 Other forms of dyspnea: Secondary | ICD-10-CM | POA: Diagnosis present

## 2021-09-22 DIAGNOSIS — E871 Hypo-osmolality and hyponatremia: Secondary | ICD-10-CM | POA: Diagnosis present

## 2021-09-22 DIAGNOSIS — F17210 Nicotine dependence, cigarettes, uncomplicated: Secondary | ICD-10-CM | POA: Diagnosis present

## 2021-09-22 DIAGNOSIS — K219 Gastro-esophageal reflux disease without esophagitis: Secondary | ICD-10-CM | POA: Diagnosis present

## 2021-09-22 DIAGNOSIS — Z85038 Personal history of other malignant neoplasm of large intestine: Secondary | ICD-10-CM

## 2021-09-22 DIAGNOSIS — R636 Underweight: Secondary | ICD-10-CM | POA: Diagnosis present

## 2021-09-22 DIAGNOSIS — Z8616 Personal history of COVID-19: Secondary | ICD-10-CM

## 2021-09-22 LAB — CBC WITH DIFFERENTIAL/PLATELET
Abs Immature Granulocytes: 0.07 10*3/uL (ref 0.00–0.07)
Basophils Absolute: 0.1 10*3/uL (ref 0.0–0.1)
Basophils Relative: 1 %
Eosinophils Absolute: 0.1 10*3/uL (ref 0.0–0.5)
Eosinophils Relative: 1 %
HCT: 45 % (ref 39.0–52.0)
Hemoglobin: 16 g/dL (ref 13.0–17.0)
Immature Granulocytes: 1 %
Lymphocytes Relative: 22 %
Lymphs Abs: 3.1 10*3/uL (ref 0.7–4.0)
MCH: 34.3 pg — ABNORMAL HIGH (ref 26.0–34.0)
MCHC: 35.6 g/dL (ref 30.0–36.0)
MCV: 96.4 fL (ref 80.0–100.0)
Monocytes Absolute: 1.9 10*3/uL — ABNORMAL HIGH (ref 0.1–1.0)
Monocytes Relative: 13 %
Neutro Abs: 8.9 10*3/uL — ABNORMAL HIGH (ref 1.7–7.7)
Neutrophils Relative %: 62 %
Platelets: 321 10*3/uL (ref 150–400)
RBC: 4.67 MIL/uL (ref 4.22–5.81)
RDW: 17 % — ABNORMAL HIGH (ref 11.5–15.5)
WBC: 14.2 10*3/uL — ABNORMAL HIGH (ref 4.0–10.5)
nRBC: 0 % (ref 0.0–0.2)

## 2021-09-22 LAB — COMPREHENSIVE METABOLIC PANEL
ALT: 28 U/L (ref 0–44)
AST: 42 U/L — ABNORMAL HIGH (ref 15–41)
Albumin: 4.7 g/dL (ref 3.5–5.0)
Alkaline Phosphatase: 176 U/L — ABNORMAL HIGH (ref 38–126)
Anion gap: 13 (ref 5–15)
BUN: 59 mg/dL — ABNORMAL HIGH (ref 6–20)
CO2: 19 mmol/L — ABNORMAL LOW (ref 22–32)
Calcium: 10 mg/dL (ref 8.9–10.3)
Chloride: 96 mmol/L — ABNORMAL LOW (ref 98–111)
Creatinine, Ser: 2.02 mg/dL — ABNORMAL HIGH (ref 0.61–1.24)
GFR, Estimated: 39 mL/min — ABNORMAL LOW (ref >60)
Glucose, Bld: 126 mg/dL — ABNORMAL HIGH (ref 70–99)
Potassium: 4.3 mmol/L (ref 3.5–5.1)
Sodium: 128 mmol/L — ABNORMAL LOW (ref 135–145)
Total Bilirubin: 1.2 mg/dL (ref 0.3–1.2)
Total Protein: 9.2 g/dL — ABNORMAL HIGH (ref 6.5–8.1)

## 2021-09-22 LAB — TROPONIN I (HIGH SENSITIVITY)
Troponin I (High Sensitivity): 9 ng/L (ref <18)
Troponin I (High Sensitivity): 10 ng/L (ref <18)

## 2021-09-22 LAB — RESP PANEL BY RT-PCR (FLU A&B, COVID) ARPGX2
Influenza A by PCR: NEGATIVE
Influenza B by PCR: NEGATIVE
SARS Coronavirus 2 by RT PCR: NEGATIVE

## 2021-09-22 LAB — D-DIMER, QUANTITATIVE: D-Dimer, Quant: 0.83 ug/mL-FEU — ABNORMAL HIGH (ref 0.00–0.50)

## 2021-09-22 MED ORDER — SODIUM CHLORIDE 0.9 % IV SOLN
1000.0000 mL | INTRAVENOUS | Status: DC
  Administered 2021-09-23 – 2021-09-24 (×4): 1000 mL via INTRAVENOUS

## 2021-09-22 MED ORDER — ENOXAPARIN SODIUM 80 MG/0.8ML IJ SOSY
70.0000 mg | PREFILLED_SYRINGE | Freq: Two times a day (BID) | INTRAMUSCULAR | Status: DC
Start: 2021-09-22 — End: 2021-09-23
  Administered 2021-09-22 – 2021-09-23 (×2): 70 mg via SUBCUTANEOUS
  Filled 2021-09-22 (×3): qty 0.7

## 2021-09-22 MED ORDER — ONDANSETRON HCL 4 MG/2ML IJ SOLN
4.0000 mg | Freq: Once | INTRAMUSCULAR | Status: AC
  Administered 2021-09-22: 4 mg via INTRAVENOUS
  Filled 2021-09-22: qty 2

## 2021-09-22 MED ORDER — SODIUM CHLORIDE 0.9 % IV BOLUS (SEPSIS)
500.0000 mL | Freq: Once | INTRAVENOUS | Status: AC
  Administered 2021-09-22: 500 mL via INTRAVENOUS

## 2021-09-22 MED ORDER — SODIUM CHLORIDE 0.9 % IV SOLN: INTRAVENOUS | Status: DC

## 2021-09-22 NOTE — Assessment & Plan Note (Addendum)
Chronic.  Is on oral Xeloda.

## 2021-09-22 NOTE — Progress Notes (Signed)
ANTICOAGULATION CONSULT NOTE - Initial Consult  Pharmacy Consult for Lovenox Indication: rule out PE  No Known Allergies  Patient Measurements: Height: 6\' 3"  (190.5 cm) Weight: 68.9 kg (152 lb) IBW/kg (Calculated) : 84.5   Vital Signs: Temp: 98.1 F (36.7 C) (12/12 1825) Temp Source: Oral (12/12 1825) BP: 155/111 (12/12 2055) Pulse Rate: 98 (12/12 2055)  Labs: Recent Labs    09/22/21 1657 09/22/21 1828  HGB 16.0  --   HCT 45.0  --   PLT 321  --   CREATININE 2.02*  --   TROPONINIHS 9 10    Estimated Creatinine Clearance: 41.7 mL/min (A) (by C-G formula based on SCr of 2.02 mg/dL (H)).   Assessment: 50 year old male with a history of Stage IV adenocarcinoma with mets to the liver admitted with sudden onset SOB to begin Lovenox for rule out PE  Goal of Therapy:  Anti-Xa level 0.6-1 units/ml 4hrs after LMWH dose given Monitor platelets by anticoagulation protocol: Yes   Plan:  Lovenox 70 mg sq Q 12 hours Follow CBC / imaging results  Thank you Anette Guarneri, PharmD   09/22/2021,9:20 PM

## 2021-09-22 NOTE — Assessment & Plan Note (Addendum)
Assigned to observation telemetry bed.  Elevated D-dimer but has AKI.  EDP uncomfortable ordering CTPA with contrast.  Patient will be hydrated with IV fluids.  Planning on VQ scan in the morning.  Repeat his chemistry in the morning.  If his BMP shows resolution of his AKI, then proceed with CTPA.  EDP is going to give him 1 dose of subcu Lovenox to cover for any potential pulmonary embolism until definitive testing can be performed.

## 2021-09-22 NOTE — ED Provider Notes (Signed)
Emergency Medicine Provider Triage Evaluation Note  Eric [name] , a 50 y.o. male  was evaluated in triage.  Pt complains of shortness of breath started out of the blue 2 days ago.  Patient does have a history of stage IV adenocarcinoma with mets to the liver.  He denies any chest pain, cough, congestion, fever, chills, nausea, vomiting, diarrhea.  Review of Systems  Positive:  Negative: See above   Physical Exam  BP (!) 109/93   Pulse (!) 107   Temp 97.6 F (36.4 C) (Oral)   Resp 20   Ht 6\' 3"  (1.905 m)   Wt 68.9 kg   SpO2 100%   BMI 19.00 kg/m  Gen:   Awake, no distress   Resp:  Normal effort, clear to auscultation bilaterally MSK:   Moves extremities without difficulty  Other:  Tachycardic  Medical Decision Making  Medically screening exam initiated at 4:56 PM.  Appropriate orders placed.  [name] was informed that the remainder of the evaluation will be completed by another provider, this initial triage assessment does not replace that evaluation, and the importance of remaining in the ED until their evaluation is complete.  Given his history of malignancy, tachycardia, and clinical scenario we will get a CT pulmonary study to rule out pulmonary embolism.   Myna Bright [place], PA-C 09/22/21 1658    Tegeler, Gwenyth Allegra, MD 09/22/21 2308

## 2021-09-22 NOTE — Assessment & Plan Note (Signed)
Patient states that he has been feeling dehydrated.  Increased ostomy output.  Will hydrate with IV fluids.  Hold any nephrotoxic drugs.

## 2021-09-22 NOTE — Assessment & Plan Note (Signed)
Chronic. 

## 2021-09-22 NOTE — ED Triage Notes (Signed)
Pt here for SOB that started on Saturday. Pt stated that he has hx of liver cancer. Pt stated he was SOB spontaneously. Pt not endorsing CP at the moment.

## 2021-09-22 NOTE — ED Provider Notes (Signed)
[facility] EMERGENCY DEPARTMENT Provider Note   CSN: [id number] Arrival date & time: 09/22/21  1545     History Chief Complaint  Patient presents with   Shortness of Breath    Eric [name] is a 50 y.o. male.   Shortness of Breath  Patient presented to the ED for evaluation of shortness of breath.  Patient states it started a couple days ago.  He has noticed that whenever he tries to get around he is getting very short of breath with any minimal activity.  He has not had any trouble with any chest pain.  No coughing.  No fevers or chills.  Has not had any vomiting or diarrhea.  Patient does have a history of metastatic colon cancer that has metastasized to the liver.  History reviewed. No pertinent past medical history.  Patient Active Problem List   Diagnosis Date Noted   Goals of care, counseling/discussion 12/18/2020   Perforated colon cancer s/p right colectomy/ileostomy 11/28/2020 12/01/2020   Malnutrition of moderate degree 12/01/2020   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 ([place]) 11/21/2020   Protein-calorie malnutrition, severe ([place]) 11/18/2020   Liver mass, right lobe 11/18/2020   Right groin pain 11/18/2020   Ambulates with cane 11/18/2020   Tobacco abuse 11/18/2020   Abdominal pain, RLQ (right lower quadrant) 11/18/2020   Fatigue 11/18/2020   Symptomatic anemia 11/18/2020   Colonic mass 11/15/2020   Iron deficiency anemia due to chronic blood loss 11/15/2020   Sepsis ([place]) 11/15/2020   COVID-19 virus infection 11/15/2020   Thrombocytosis 11/15/2020   Retroperitoneal abscess ([place]) 11/15/2020   Hyponatremia 11/15/2020    Past Surgical History:  Procedure Laterality Date   CYSTOSCOPY WITH STENT PLACEMENT N/A 11/28/2020   Procedure: CYSTOSCOPY WITH STENT PLACEMENT;  Surgeon: Janith Lima, MD;  Location: [place];  Service: Urology;  Laterality: N/A;   IR RADIOLOGIST EVAL & MGMT  07/22/2021   LAPAROTOMY N/A 11/28/2020   Procedure:  EXPLORATORY LAPAROTOMY;  Surgeon: Ralene Ok, MD;  Location: [place];  Service: General;  Laterality: N/A;  DOW CASE TO START AT 730AM 4 HOUR CASE   OSTOMY N/A 11/28/2020   Procedure: ILEOSTOMY CREATION;  Surgeon: Ralene Ok, MD;  Location: [place];  Service: General;  Laterality: N/A;   PARTIAL COLECTOMY Right 11/28/2020   Procedure: RIGHT COLECTOMY;  Surgeon: Ralene Ok, MD;  Location: [place];  Service: General;  Laterality: Right;   PORTACATH PLACEMENT Right 12/12/2020   Procedure: INSERTION PORT-A-CATH;  Surgeon: Dwan Bolt, MD;  Location: [place];  Service: General;  Laterality: Right;       Family History  Problem Relation Age of Onset   Diabetes Sister     Social History   Tobacco Use   Smoking status: Every Day    Packs/day: 0.50    Years: 30.00    Pack years: 15.00    Types: Cigarettes   Smokeless tobacco: Never   Tobacco comments:    Up to 1.5 PPD since age 28  Substance Use Topics   Alcohol use: Not Currently   Drug use: Yes    Types: Marijuana    Home Medications Prior to Admission medications   Medication Sig Start Date End Date Taking? Authorizing Provider  acetaminophen (TYLENOL) 500 MG tablet TAKE 2 TABLETS (1,000 MG TOTAL) BY MOUTH EVERY SIX HOURS. 12/13/20 12/13/21  Pahwani, Michell Heinrich, MD  capecitabine (XELODA) 500 MG tablet Take #2 tablets (1000 mg) every morning and #1 tablet (500 mg) every  evening 09/16/21   Ladell Pier, MD  diazepam (VALIUM) 5 MG tablet Take 1/2 tablet 30 to 60 minutes prior to MRI.  May repeat x1. Patient not taking: Reported on 08/05/2021 06/25/21   Owens Shark, NP  feeding supplement (ENSURE ENLIVE / ENSURE PLUS) LIQD Take 237 mLs by mouth 3 (three) times daily between meals. 12/13/20   Pahwani, Michell Heinrich, MD  gabapentin (NEURONTIN) 300 MG capsule TAKE 1 CAPSULE BY MOUTH THREE TIMES A DAY 08/05/21   Ladell Pier, MD  lidocaine-prilocaine (EMLA) cream Apply to port site 1-2 hours prior to stick and cover with plastic wrap  to numb site as directed. 05/27/21   Ladell Pier, MD  ondansetron (ZOFRAN) 8 MG tablet TAKE 1 TABLET BY MOUTH EVERY 8 HOURS AS NEEDED FOR NAUSEA OR VOMITING. [place] IV CHEMO SESSION 08/05/21   Ladell Pier, MD  Oxycodone HCl 10 MG TABS Take 1/2 to 1 tablet by mouth every 6 hours as needed (1/2 tablet for moderate pain, and 1 tablet for severe pain). 09/16/21   Owens Shark, NP  pantoprazole (PROTONIX) 40 MG tablet Take 1 tablet (40 mg total) by mouth daily. 08/05/21   Ladell Pier, MD  polycarbophil (FIBERCON) 625 MG tablet Take 1 tablet (625 mg total) by mouth daily. 01/06/21   Ladell Pier, MD  prochlorperazine (COMPAZINE) 10 MG tablet Take 1 tablet (10 mg total) by mouth every 6 (six) hours as needed for nausea. 08/05/21   Ladell Pier, MD    Allergies    Patient has no known allergies.  Review of Systems   Review of Systems  Respiratory:  Positive for shortness of breath.   All other systems reviewed and are negative.  Physical Exam Updated Vital Signs BP (!) 110/98   Pulse (!) 105   Temp 98.1 F (36.7 C) (Oral)   Resp 16   Ht 1.905 m (6\' 3" )   Wt 68.9 kg   SpO2 99%   BMI 19.00 kg/m   Physical Exam Vitals and nursing note reviewed.  Constitutional:      General: He is not in acute distress.    Appearance: He is well-developed.  HENT:     Head: Normocephalic and atraumatic.     Right Ear: External ear normal.     Left Ear: External ear normal.  Eyes:     General: No scleral icterus.       Right eye: No discharge.        Left eye: No discharge.     Conjunctiva/sclera: Conjunctivae normal.  Neck:     Trachea: No tracheal deviation.  Cardiovascular:     Rate and Rhythm: Regular rhythm. Tachycardia present.  Pulmonary:     Effort: Pulmonary effort is normal. No respiratory distress.     Breath sounds: Normal breath sounds. No stridor. No wheezing or rales.  Abdominal:     General: Bowel sounds are normal. There is no distension.      Palpations: Abdomen is soft.     Tenderness: There is no abdominal tenderness. There is no guarding or rebound.  Musculoskeletal:        General: No tenderness or deformity.     Cervical back: Neck supple.  Skin:    General: Skin is warm and dry.     Findings: No rash.  Neurological:     General: No focal deficit present.     Mental Status: He is alert.  Cranial Nerves: No cranial nerve deficit (no facial droop, extraocular movements intact, no slurred speech).     Sensory: No sensory deficit.     Motor: No abnormal muscle tone or seizure activity.     Coordination: Coordination normal.  Psychiatric:        Mood and Affect: Mood normal.    ED Results / Procedures / Treatments   Labs (all labs ordered are listed, but only abnormal results are displayed) Labs Reviewed  CBC WITH DIFFERENTIAL/PLATELET - Abnormal; Notable for the following components:      Result Value   WBC 14.2 (*)    MCH 34.3 (*)    RDW 17.0 (*)    Neutro Abs 8.9 (*)    Monocytes Absolute 1.9 (*)    All other components within normal limits  COMPREHENSIVE METABOLIC PANEL - Abnormal; Notable for the following components:   Sodium 128 (*)    Chloride 96 (*)    CO2 19 (*)    Glucose, Bld 126 (*)    BUN 59 (*)    Creatinine, Ser 2.02 (*)    Total Protein 9.2 (*)    AST 42 (*)    Alkaline Phosphatase 176 (*)    GFR, Estimated 39 (*)    All other components within normal limits  RESP PANEL BY RT-PCR (FLU A&B, COVID) ARPGX2  D-DIMER, QUANTITATIVE  TROPONIN I (HIGH SENSITIVITY)  TROPONIN I (HIGH SENSITIVITY)    EKG None  Radiology No results found.  Procedures Procedures   Medications Ordered in ED Medications - No data to display  ED Course  I have reviewed the triage vital signs and the nursing notes.  Pertinent labs & imaging results that were available during my care of the patient were reviewed by me and considered in my medical decision making (see chart for details).  Clinical  Course as of 09/23/21 1601  Mon Sep 22, 2021  1944 Laboratory test notable for AKI.  Patient has a leukocytosis.  COVID and flu are negative.  Initial troponin normal at 9 [JK]  1945 CT angiogram ordered at triage but with his creatinine of 2.02 we will hold off on that right now.  We will add on a D-dimer and see what his chest x-ray shows [JK]  2044 Chest x-ray does not show any acute findings [JK]    Clinical Course User Index [JK] Dorie Rank, MD   MDM Rules/Calculators/A&P                           Patient presented with complaints of shortness of breath.  He does have a history of malignancy.  He is at risk for PE.  No signs of pneumonia on his chest x-ray.  Initial metabolic panel showed elevation in his BUN and creatinine.  Presentation concerning for the possibility of pulmonary embolism.  Not a candidate for CT injury with as elevated creatinine.  VQ scan was ordered however that is unavailable until the morning time.  I consulted with the medical service for admission.  Patient was given a dose of Lovenox and anticipate either a VQ scan in the morning or repeating metabolic panel to see if he can safely get a CT scan. Final Clinical Impression(s) / ED Diagnoses Final diagnoses:  SOB (shortness of breath)  AKI (acute kidney injury) ([place])     Dorie Rank, MD 09/23/21 [phone]

## 2021-09-22 NOTE — Subjective & Objective (Signed)
Chief complaint: Dyspnea exertion/shortness of breath History present illness: A 50 year old male with a history of colon cancer with liver mets, chronic tobacco abuse, presents to the ER today with shortness of breath with sudden onset starting late Friday night early/Saturday morning.  Patient states that he was at home at the time it happened.  He was not exercising.  He denies any recent lower extremity edema or swelling.  Patient states that he had trouble walking from his bedroom to his bathroom.  This been going on for the last 48 hours.  Denies any chest pain.  He has had occasional chills but no fevers.  He states that he is cold often due to his thin body habitus.  Patient states he has been feeling somewhat dehydrated.  Has been drinking lots of water and sodas.  He has been noticing increased output from his ostomy.  In the ER, temperature 98.7 0.6.  Heart rate 107.  Blood pressure 109/93.  Satting high percent on room air.  Labs showed a D-dimer of 0.83.  COVID test was negative.  Notable labs chemistry sodium 128, bicarb 19, BUN of 59, creatinine 2.0  white count 14.2, hemoglobin 16.  EKG shows sinus tach  Due to inability to rule out PE, and AKI, [facility] hospitalist contacted for admission.

## 2021-09-22 NOTE — Assessment & Plan Note (Addendum)
Patient's had some mild hyponatremia the past.  We will continue hydration with IV normal saline.

## 2021-09-22 NOTE — H&P (Signed)
History and Physical    ULYSESS [name] HQR:[id number] DOB: Feb 12, 1971 DOA: 09/22/2021  PCP: Practice, [facility]   Patient coming from: Home  I have personally briefly reviewed patient's old medical records in [place]  Chief complaint: Dyspnea exertion/shortness of breath History present illness: A 50 year old male with a history of colon cancer with liver mets, chronic tobacco abuse, presents to the ER today with shortness of breath with sudden onset starting late Friday night early/Saturday morning.  Patient states that he was at home at the time it happened.  He was not exercising.  He denies any recent lower extremity edema or swelling.  Patient states that he had trouble walking from his bedroom to his bathroom.  This been going on for the last 48 hours.  Denies any chest pain.  He has had occasional chills but no fevers.  He states that he is cold often due to his thin body habitus.  Patient states he has been feeling somewhat dehydrated.  Has been drinking lots of water and sodas.  He has been noticing increased output from his ostomy.  In the ER, temperature 98.7 0.6.  Heart rate 107.  Blood pressure 109/93.  Satting high percent on room air.  Labs showed a D-dimer of 0.83.  COVID test was negative.  Notable labs chemistry sodium 128, bicarb 19, BUN of 59, creatinine 2.0  white count 14.2, hemoglobin 16.  EKG shows sinus tach  Due to inability to rule out PE, and AKI, [facility] hospital[facility] contacted for admission.   ED Course: tachycardic but not hypoxic. D-dimer 0.83. pt with AKI. EDP not comfortable giving IV contrast for CTPA. Lovenox ordered. V/Q scan planned for tomorrow.  Review of Systems:  Review of Systems  Constitutional:  Positive for chills and malaise/fatigue. Negative for fever.  HENT: Negative.    Eyes: Negative.   Respiratory:         Dyspnea on exertion  Cardiovascular: Negative.  Negative for orthopnea and PND.  Gastrointestinal:  Negative.   Genitourinary: Negative.   Musculoskeletal: Negative.   Skin: Negative.   Neurological: Negative.   Endo/Heme/Allergies: Negative.   Psychiatric/Behavioral: Negative.    All other systems reviewed and are negative.  Past Medical History:  Diagnosis Date   COVID-19 virus infection 11/15/2020    Past Surgical History:  Procedure Laterality Date   CYSTOSCOPY WITH STENT PLACEMENT N/A 11/28/2020   Procedure: CYSTOSCOPY WITH STENT PLACEMENT;  Surgeon: Janith Lima, MD;  Location: [place];  Service: Urology;  Laterality: N/A;   IR RADIOLOGIST EVAL & MGMT  07/22/2021   LAPAROTOMY N/A 11/28/2020   Procedure: EXPLORATORY LAPAROTOMY;  Surgeon: Ralene Ok, MD;  Location: [place];  Service: General;  Laterality: N/A;  DOW CASE TO START AT 730AM 4 HOUR CASE   OSTOMY N/A 11/28/2020   Procedure: ILEOSTOMY CREATION;  Surgeon: Ralene Ok, MD;  Location: [place];  Service: General;  Laterality: N/A;   PARTIAL COLECTOMY Right 11/28/2020   Procedure: RIGHT COLECTOMY;  Surgeon: Ralene Ok, MD;  Location: [place];  Service: General;  Laterality: Right;   PORTACATH PLACEMENT Right 12/12/2020   Procedure: INSERTION PORT-A-CATH;  Surgeon: Dwan Bolt, MD;  Location: [place];  Service: General;  Laterality: Right;     reports that he has been smoking cigarettes. He has a 15.00 pack-year smoking history. He has never used smokeless tobacco. He reports that he does not currently use alcohol. He reports current drug use. Drug: Marijuana.  No Known Allergies  Family History  Problem Relation Age of Onset   Diabetes Sister     Prior to Admission medications   Medication Sig Start Date End Date Taking? Authorizing Provider  acetaminophen (TYLENOL) 500 MG tablet TAKE 2 TABLETS (1,000 MG TOTAL) BY MOUTH EVERY SIX HOURS. 12/13/20 12/13/21  Pahwani, Michell Heinrich, MD  capecitabine (XELODA) 500 MG tablet Take #2 tablets (1000 mg) every morning and #1 tablet (500 mg) every evening 09/16/21   Ladell Pier, MD  diazepam (VALIUM) 5 MG tablet Take 1/2 tablet 30 to 60 minutes prior to MRI.  May repeat x1. Patient not taking: Reported on 08/05/2021 06/25/21   Owens Shark, NP  feeding supplement (ENSURE ENLIVE / ENSURE PLUS) LIQD Take 237 mLs by mouth 3 (three) times daily between meals. 12/13/20   Pahwani, Michell Heinrich, MD  gabapentin (NEURONTIN) 300 MG capsule TAKE 1 CAPSULE BY MOUTH THREE TIMES A DAY 08/05/21   Ladell Pier, MD  lidocaine-prilocaine (EMLA) cream Apply to port site 1-2 hours prior to stick and cover with plastic wrap to numb site as directed. 05/27/21   Ladell Pier, MD  ondansetron (ZOFRAN) 8 MG tablet TAKE 1 TABLET BY MOUTH EVERY 8 HOURS AS NEEDED FOR NAUSEA OR VOMITING. [place] IV CHEMO SESSION 08/05/21   Ladell Pier, MD  Oxycodone HCl 10 MG TABS Take 1/2 to 1 tablet by mouth every 6 hours as needed (1/2 tablet for moderate pain, and 1 tablet for severe pain). 09/16/21   Owens Shark, NP  pantoprazole (PROTONIX) 40 MG tablet Take 1 tablet (40 mg total) by mouth daily. 08/05/21   Ladell Pier, MD  polycarbophil (FIBERCON) 625 MG tablet Take 1 tablet (625 mg total) by mouth daily. 01/06/21   Ladell Pier, MD  prochlorperazine (COMPAZINE) 10 MG tablet Take 1 tablet (10 mg total) by mouth every 6 (six) hours as needed for nausea. 08/05/21   Ladell Pier, MD    Physical Exam: Vitals:   09/22/21 1547 09/22/21 1652 09/22/21 1825 09/22/21 2055  BP: (!) 109/93  (!) 110/98 (!) 155/111  Pulse: (!) 107  (!) 105 98  Resp: 20  16 18   Temp: 97.6 F (36.4 C)  98.1 F (36.7 C)   TempSrc: Oral  Oral   SpO2: 100%  99% 94%  Weight:  68.9 kg    Height:  6\' 3"  (1.905 m)      Physical Exam Vitals and nursing note reviewed.  Constitutional:      General: He is not in acute distress.    Appearance: He is not toxic-appearing or diaphoretic.     Comments: Appears chronically ill, thin, white male.  HENT:     Head: Normocephalic and atraumatic.     Nose:  Nose normal.  Eyes:     General:        Right eye: No discharge.        Left eye: No discharge.  Cardiovascular:     Rate and Rhythm: Normal rate and regular rhythm.     Pulses: Normal pulses.  Pulmonary:     Effort: Pulmonary effort is normal. No respiratory distress.     Breath sounds: No wheezing or rales.  Abdominal:     General: Abdomen is flat. Bowel sounds are normal. There is no distension.     Tenderness: There is no abdominal tenderness. There is no guarding.     Comments: +colostomy RLQ  Musculoskeletal:  Right lower leg: No edema.     Left lower leg: No edema.     Comments: No palpable cords in calves or thighs  Skin:    General: Skin is warm and dry.     Capillary Refill: Capillary refill takes less than 2 seconds.  Neurological:     General: No focal deficit present.     Mental Status: He is alert and oriented to person, place, and time.     Labs on Admission: I have personally reviewed following labs and imaging studies  CBC: Recent Labs  Lab 09/16/21 0908 09/22/21 1657  WBC 8.3 14.2*  NEUTROABS 5.4 8.9*  HGB 11.6* 16.0  HCT 34.2* 45.0  MCV 100.0 96.4  PLT 197 742   Basic Metabolic Panel: Recent Labs  Lab 09/16/21 0908 09/22/21 1657  NA 132* 128*  K 3.8 4.3  CL 98 96*  CO2 24 19*  GLUCOSE 126* 126*  BUN 17 59*  CREATININE 1.13 2.02*  CALCIUM 10.0 10.0   GFR: Estimated Creatinine Clearance: 41.7 mL/min (A) (by C-G formula based on SCr of 2.02 mg/dL (H)). Liver Function Tests: Recent Labs  Lab 09/16/21 0908 09/22/21 1657  AST 18 42*  ALT 14 28  ALKPHOS 165* 176*  BILITOT 1.2 1.2  PROT 7.7 9.2*  ALBUMIN 4.6 4.7   No results for input(s): LIPASE, AMYLASE in the last 168 hours. No results for input(s): AMMONIA in the last 168 hours. Coagulation Profile: No results for input(s): INR, PROTIME in the last 168 hours. Cardiac Enzymes: No results for input(s): CKTOTAL, CKMB, CKMBINDEX, TROPONINI in the last 168 hours. BNP (last 3  results) No results for input(s): PROBNP in the last 8760 hours. HbA1C: No results for input(s): HGBA1C in the last 72 hours. CBG: No results for input(s): GLUCAP in the last 168 hours. Lipid Profile: No results for input(s): CHOL, HDL, LDLCALC, TRIG, CHOLHDL, LDLDIRECT in the last 72 hours. Thyroid Function Tests: No results for input(s): TSH, T4TOTAL, FREET4, T3FREE, THYROIDAB in the last 72 hours. Anemia Panel: No results for input(s): VITAMINB12, FOLATE, FERRITIN, TIBC, IRON, RETICCTPCT in the last 72 hours. Urine analysis:    Component Value Date/Time   COLORURINE YELLOW 11/16/2020 0015   APPEARANCEUR CLEAR 11/16/2020 0015   LABSPEC 1.044 (H) 11/16/2020 0015   PHURINE 5.0 11/16/2020 0015   GLUCOSEU NEGATIVE 11/16/2020 0015   HGBUR NEGATIVE 11/16/2020 0015   BILIRUBINUR NEGATIVE 11/16/2020 0015   KETONESUR NEGATIVE 11/16/2020 0015   PROTEINUR NEGATIVE 11/16/2020 0015   NITRITE NEGATIVE 11/16/2020 0015   LEUKOCYTESUR NEGATIVE 11/16/2020 0015    Radiological Exams on Admission: I have personally reviewed images DG Chest 1 View  Result Date: 09/22/2021 CLINICAL DATA:  Shortness of breath EXAM: CHEST  1 VIEW COMPARISON:  12/12/2020 FINDINGS: Right-sided central venous port tip over the SVC. No focal opacity, pleural effusion or pneumothorax. Normal cardiomediastinal silhouette. No pneumothorax. IMPRESSION: No active disease. Electronically Signed   By: Donavan Foil M.D.   On: 09/22/2021 20:08    EKG: I have personally reviewed EKG: sinus tachycardia    Assessment/Plan Principal Problem:   DOE (dyspnea on exertion) - rule out PE Active Problems:   AKI (acute kidney injury) ([place])   Hyponatremia   Tobacco abuse   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)    DOE (dyspnea on exertion) - rule out PE Assigned to observation telemetry bed.  Elevated D-dimer but has AKI.  EDP uncomfortable ordering CTPA with contrast.  Patient will be hydrated with  IV fluids.   Planning on VQ scan in the morning.  Repeat his chemistry in the morning.  If his BMP shows resolution of his AKI, then proceed with CTPA.  EDP is going to give him 1 dose of subcu Lovenox to cover for any potential pulmonary embolism until definitive testing can be performed.  AKI (acute kidney injury) [facility]) Patient states that he has been feeling dehydrated.  Increased ostomy output.  Will hydrate with IV fluids.  Hold any nephrotoxic drugs.  Hyponatremia Patient's had some mild hyponatremia the past.  We will continue hydration with IV normal saline.  Tobacco abuse Chronic.  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) Chronic.  Is on oral Xeloda.  DVT prophylaxis: Lovenox Code Status: Full Code Family Communication: discussed with pt at bedside. Pt's sister asleep in recliner.  Disposition Plan: return home  Consults called: none  Admission status: Observation, Telemetry bed   Kristopher Oppenheim, DO [facility] 09/22/2021, 10:01 PM

## 2021-09-23 ENCOUNTER — Observation Stay (HOSPITAL_BASED_OUTPATIENT_CLINIC_OR_DEPARTMENT_OTHER): Payer: 59

## 2021-09-23 ENCOUNTER — Observation Stay (HOSPITAL_COMMUNITY): Payer: 59

## 2021-09-23 DIAGNOSIS — R0602 Shortness of breath: Secondary | ICD-10-CM | POA: Diagnosis present

## 2021-09-23 DIAGNOSIS — M7989 Other specified soft tissue disorders: Secondary | ICD-10-CM | POA: Diagnosis not present

## 2021-09-23 DIAGNOSIS — Z79899 Other long term (current) drug therapy: Secondary | ICD-10-CM | POA: Diagnosis not present

## 2021-09-23 DIAGNOSIS — N179 Acute kidney failure, unspecified: Secondary | ICD-10-CM | POA: Diagnosis present

## 2021-09-23 DIAGNOSIS — C787 Secondary malignant neoplasm of liver and intrahepatic bile duct: Secondary | ICD-10-CM | POA: Diagnosis present

## 2021-09-23 DIAGNOSIS — Z9049 Acquired absence of other specified parts of digestive tract: Secondary | ICD-10-CM | POA: Diagnosis not present

## 2021-09-23 DIAGNOSIS — J449 Chronic obstructive pulmonary disease, unspecified: Secondary | ICD-10-CM | POA: Diagnosis present

## 2021-09-23 DIAGNOSIS — Z85038 Personal history of other malignant neoplasm of large intestine: Secondary | ICD-10-CM | POA: Diagnosis not present

## 2021-09-23 DIAGNOSIS — R0609 Other forms of dyspnea: Secondary | ICD-10-CM

## 2021-09-23 DIAGNOSIS — F17210 Nicotine dependence, cigarettes, uncomplicated: Secondary | ICD-10-CM | POA: Diagnosis present

## 2021-09-23 DIAGNOSIS — C189 Malignant neoplasm of colon, unspecified: Secondary | ICD-10-CM | POA: Diagnosis not present

## 2021-09-23 DIAGNOSIS — Z681 Body mass index (BMI) 19 or less, adult: Secondary | ICD-10-CM | POA: Diagnosis not present

## 2021-09-23 DIAGNOSIS — R609 Edema, unspecified: Secondary | ICD-10-CM

## 2021-09-23 DIAGNOSIS — E860 Dehydration: Secondary | ICD-10-CM | POA: Diagnosis present

## 2021-09-23 DIAGNOSIS — E871 Hypo-osmolality and hyponatremia: Secondary | ICD-10-CM | POA: Diagnosis present

## 2021-09-23 DIAGNOSIS — Z833 Family history of diabetes mellitus: Secondary | ICD-10-CM | POA: Diagnosis not present

## 2021-09-23 DIAGNOSIS — K219 Gastro-esophageal reflux disease without esophagitis: Secondary | ICD-10-CM | POA: Diagnosis present

## 2021-09-23 DIAGNOSIS — I10 Essential (primary) hypertension: Secondary | ICD-10-CM | POA: Diagnosis present

## 2021-09-23 DIAGNOSIS — Z933 Colostomy status: Secondary | ICD-10-CM | POA: Diagnosis not present

## 2021-09-23 DIAGNOSIS — Z8616 Personal history of COVID-19: Secondary | ICD-10-CM | POA: Diagnosis not present

## 2021-09-23 DIAGNOSIS — R636 Underweight: Secondary | ICD-10-CM | POA: Diagnosis present

## 2021-09-23 LAB — CBC WITH DIFFERENTIAL/PLATELET
Abs Immature Granulocytes: 0.09 10*3/uL — ABNORMAL HIGH (ref 0.00–0.07)
Basophils Absolute: 0.1 10*3/uL (ref 0.0–0.1)
Basophils Relative: 1 %
Eosinophils Absolute: 0.1 10*3/uL (ref 0.0–0.5)
Eosinophils Relative: 1 %
HCT: 40.3 % (ref 39.0–52.0)
Hemoglobin: 14 g/dL (ref 13.0–17.0)
Immature Granulocytes: 1 %
Lymphocytes Relative: 16 %
Lymphs Abs: 2.1 10*3/uL (ref 0.7–4.0)
MCH: 34.7 pg — ABNORMAL HIGH (ref 26.0–34.0)
MCHC: 34.7 g/dL (ref 30.0–36.0)
MCV: 99.8 fL (ref 80.0–100.0)
Monocytes Absolute: 1.5 10*3/uL — ABNORMAL HIGH (ref 0.1–1.0)
Monocytes Relative: 12 %
Neutro Abs: 9 10*3/uL — ABNORMAL HIGH (ref 1.7–7.7)
Neutrophils Relative %: 69 %
Platelets: 231 10*3/uL (ref 150–400)
RBC: 4.04 MIL/uL — ABNORMAL LOW (ref 4.22–5.81)
RDW: 17.1 % — ABNORMAL HIGH (ref 11.5–15.5)
WBC: 12.7 10*3/uL — ABNORMAL HIGH (ref 4.0–10.5)
nRBC: 0 % (ref 0.0–0.2)

## 2021-09-23 LAB — BASIC METABOLIC PANEL
Anion gap: 9 (ref 5–15)
BUN: 46 mg/dL — ABNORMAL HIGH (ref 6–20)
CO2: 21 mmol/L — ABNORMAL LOW (ref 22–32)
Calcium: 9.1 mg/dL (ref 8.9–10.3)
Chloride: 97 mmol/L — ABNORMAL LOW (ref 98–111)
Creatinine, Ser: 1.44 mg/dL — ABNORMAL HIGH (ref 0.61–1.24)
GFR, Estimated: 58 mL/min — ABNORMAL LOW (ref >60)
Glucose, Bld: 145 mg/dL — ABNORMAL HIGH (ref 70–99)
Potassium: 4 mmol/L (ref 3.5–5.1)
Sodium: 127 mmol/L — ABNORMAL LOW (ref 135–145)

## 2021-09-23 LAB — COMPREHENSIVE METABOLIC PANEL WITH GFR
ALT: 31 U/L (ref 0–44)
AST: 40 U/L (ref 15–41)
Albumin: 3.9 g/dL (ref 3.5–5.0)
Alkaline Phosphatase: 150 U/L — ABNORMAL HIGH (ref 38–126)
Anion gap: 10 (ref 5–15)
BUN: 59 mg/dL — ABNORMAL HIGH (ref 6–20)
CO2: 19 mmol/L — ABNORMAL LOW (ref 22–32)
Calcium: 9 mg/dL (ref 8.9–10.3)
Chloride: 97 mmol/L — ABNORMAL LOW (ref 98–111)
Creatinine, Ser: 1.74 mg/dL — ABNORMAL HIGH (ref 0.61–1.24)
GFR, Estimated: 47 mL/min — ABNORMAL LOW
Glucose, Bld: 130 mg/dL — ABNORMAL HIGH (ref 70–99)
Potassium: 3.7 mmol/L (ref 3.5–5.1)
Sodium: 126 mmol/L — ABNORMAL LOW (ref 135–145)
Total Bilirubin: 1 mg/dL (ref 0.3–1.2)
Total Protein: 7.9 g/dL (ref 6.5–8.1)

## 2021-09-23 LAB — T4, FREE: Free T4: 0.97 ng/dL (ref 0.61–1.12)

## 2021-09-23 LAB — ECHOCARDIOGRAM COMPLETE
Height: 75 in
Weight: 2432 oz

## 2021-09-23 LAB — TSH: TSH: 1.172 u[IU]/mL (ref 0.350–4.500)

## 2021-09-23 LAB — MAGNESIUM: Magnesium: 2.6 mg/dL — ABNORMAL HIGH (ref 1.7–2.4)

## 2021-09-23 MED ORDER — ACETAMINOPHEN 650 MG RE SUPP: 650.0000 mg | Freq: Four times a day (QID) | RECTAL | Status: DC | PRN

## 2021-09-23 MED ORDER — ONDANSETRON HCL 4 MG/2ML IJ SOLN: 4.0000 mg | Freq: Four times a day (QID) | INTRAMUSCULAR | Status: DC | PRN

## 2021-09-23 MED ORDER — ACETAMINOPHEN 325 MG PO TABS: 650.0000 mg | ORAL_TABLET | Freq: Four times a day (QID) | ORAL | Status: DC | PRN

## 2021-09-23 MED ORDER — DIPHENOXYLATE-ATROPINE 2.5-0.025 MG PO TABS
1.0000 | ORAL_TABLET | Freq: Four times a day (QID) | ORAL | Status: DC
  Administered 2021-09-23 (×2): 1 via ORAL
  Filled 2021-09-23 (×2): qty 1

## 2021-09-23 MED ORDER — ARFORMOTEROL TARTRATE 15 MCG/2ML IN NEBU
15.0000 ug | INHALATION_SOLUTION | Freq: Two times a day (BID) | RESPIRATORY_TRACT | Status: DC
  Administered 2021-09-23 – 2021-09-24 (×2): 15 ug via RESPIRATORY_TRACT
  Filled 2021-09-23 (×2): qty 2

## 2021-09-23 MED ORDER — ALBUTEROL SULFATE (2.5 MG/3ML) 0.083% IN NEBU: 2.5000 mg | INHALATION_SOLUTION | RESPIRATORY_TRACT | Status: DC | PRN

## 2021-09-23 MED ORDER — ONDANSETRON HCL 4 MG PO TABS: 4.0000 mg | ORAL_TABLET | Freq: Four times a day (QID) | ORAL | Status: DC | PRN

## 2021-09-23 MED ORDER — MELATONIN 5 MG PO TABS
10.0000 mg | ORAL_TABLET | Freq: Every evening | ORAL | Status: DC | PRN
  Administered 2021-09-23: 10 mg via ORAL
  Filled 2021-09-23: qty 2

## 2021-09-23 MED ORDER — ALUM & MAG HYDROXIDE-SIMETH 200-200-20 MG/5ML PO SUSP
30.0000 mL | ORAL | Status: DC | PRN
  Administered 2021-09-23: 30 mL via ORAL
  Filled 2021-09-23: qty 30

## 2021-09-23 MED ORDER — PANTOPRAZOLE SODIUM 40 MG PO TBEC
40.0000 mg | DELAYED_RELEASE_TABLET | Freq: Every day | ORAL | Status: DC
  Administered 2021-09-23 – 2021-09-24 (×2): 40 mg via ORAL
  Filled 2021-09-23 (×2): qty 1

## 2021-09-23 MED ORDER — FAMOTIDINE IN NACL 20-0.9 MG/50ML-% IV SOLN
20.0000 mg | Freq: Once | INTRAVENOUS | Status: AC
  Administered 2021-09-23: 20 mg via INTRAVENOUS
  Filled 2021-09-23: qty 50

## 2021-09-23 MED ORDER — UMECLIDINIUM BROMIDE 62.5 MCG/ACT IN AEPB
1.0000 | INHALATION_SPRAY | Freq: Every day | RESPIRATORY_TRACT | Status: DC
  Filled 2021-09-23: qty 7

## 2021-09-23 MED ORDER — TECHNETIUM TO 99M ALBUMIN AGGREGATED
4.2000 | Freq: Once | INTRAVENOUS | Status: AC | PRN
  Administered 2021-09-23: 4.2 via INTRAVENOUS

## 2021-09-23 MED ORDER — OXYCODONE HCL 5 MG PO TABS
5.0000 mg | ORAL_TABLET | Freq: Four times a day (QID) | ORAL | Status: DC | PRN
Start: 2021-09-23 — End: 2021-09-24

## 2021-09-23 MED ORDER — ENOXAPARIN SODIUM 40 MG/0.4ML IJ SOSY
40.0000 mg | PREFILLED_SYRINGE | INTRAMUSCULAR | Status: DC
  Administered 2021-09-24: 10:00:00 40 mg via SUBCUTANEOUS
  Filled 2021-09-23: qty 0.4

## 2021-09-23 NOTE — Progress Notes (Addendum)
PROGRESS NOTE        PATIENT DETAILS Name: Eric [name] Age: 50 y.o. Sex: male Date of Birth: [birth year] Admit Date: 09/22/2021 Admitting Physician Kristopher Oppenheim, DO ZOX:[id number], [facility]  Brief Narrative: Patient is a 50 y.o. male with history of metastatic colon CA-on Xeloda-s/p colostomy-presented with exertional dyspnea, AKI and hyponatremia.  See below for further details.  Subjective: Lying comfortably in bed-denies any chest pain or shortness of breath.  Does not have any shortness of breath at rest-acknowledges increased ostomy output.  Objective: Vitals: Blood pressure (!) 142/99, pulse 77, temperature 98.1 F (36.7 C), temperature source Oral, resp. rate 15, height 6\' 3"  (1.905 m), weight 68.9 kg, SpO2 100 %.   Exam: Gen Exam:Alert awake-not in any distress.  Disheveled appearance. HEENT:atraumatic, normocephalic Chest: B/L clear to auscultation anteriorly CVS:S1S2 regular Abdomen:soft non tender, non distended.  Ostomy in place. Extremities:no edema Neurology: Non focal Skin: no rash  Pertinent Labs/Radiology: Recent Labs  Lab 09/22/21 1657 09/23/21 0335  WBC 14.2* 12.7*  HGB 16.0 14.0  PLT 321 231  NA 128* 126*  K 4.3 3.7  CREATININE 2.02* 1.74*  AST 42* 40  ALT 28 31  ALKPHOS 176* 150*  BILITOT 1.2 1.0   :  Assessment/Plan: Exertional dyspnea: Lungs sound clear-CXR negative for infiltrates-no signs of volume overload-awaiting echo/VQ scan and lower extremity Dopplers.  Minimally elevated D-dimer.  Continue empiric therapeutic Lovenox until VTE ruled out.  Addendum: Doppler/VQ scan neg for VTE-change to prophylactic lovenox. ?copd-will start bronchodilators and see how he does.  AKI: Suspect due to increased ostomy output-creatinine slowly improving-remains on IVF.  Will order close monitoring of I/O's-and monitor renal function closely.  Continue to avoid nephrotoxic agents.  Hyponatremia: I suspect this is  from increased ostomy output/dehydration-however sodium levels have dropped with IVF.  Recheck electrolytes now-follow.  Increased ostomy output: Watch closely-May need to be started on Imodium/Lomotil.  Colon cancer-s/p colectomy-ostomy in place-with mets to the liver: On Xeloda.  GERD: Continue PPI  Underweight: Estimated body mass index is 19 kg/m as calculated from the following:   Height as of this encounter: 6\' 3"  (1.905 m).   Weight as of this encounter: 68.9 kg.    Procedures: None Consults: None DVT Prophylaxis: Lovenox Code Status:Full code Family Communication: None at bedside  Time spent: 35 minutes-Greater than 50% of this time was spent in counseling, explanation of diagnosis, planning of further management, and coordination of care.   Disposition Plan: Status is: Observation  The patient will require care spanning > 2 midnights and should be moved to inpatient because: Ruling out PE-his AKI/hyponatremia and on IVF.  Concern for high output ostomy-we will need further stabilization of his ostomy output/electrolytes before consideration of discharge.  Not yet stable for discharge.   Diet: Diet Order             Diet regular Room service appropriate? Yes; Fluid consistency: Thin  Diet effective now                     Antimicrobial agents: Anti-infectives (From admission, onward)    None        MEDICATIONS: Scheduled Meds:  enoxaparin (LOVENOX) injection  70 mg Subcutaneous Q12H   pantoprazole  40 mg Oral Daily   Continuous Infusions:  sodium chloride 125 mL/hr at 09/23/21 0827  PRN Meds:.acetaminophen **OR** acetaminophen, alum & mag hydroxide-simeth, melatonin, ondansetron **OR** ondansetron (ZOFRAN) IV, oxyCODONE   I have personally reviewed following labs and imaging studies  LABORATORY DATA: CBC: Recent Labs  Lab 09/22/21 1657 09/23/21 0335  WBC 14.2* 12.7*  NEUTROABS 8.9* 9.0*  HGB 16.0 14.0  HCT 45.0 40.3  MCV 96.4 99.8   PLT 321 710    Basic Metabolic Panel: Recent Labs  Lab 09/22/21 1657 09/23/21 0335  NA 128* 126*  K 4.3 3.7  CL 96* 97*  CO2 19* 19*  GLUCOSE 126* 130*  BUN 59* 59*  CREATININE 2.02* 1.74*  CALCIUM 10.0 9.0  MG  --  2.6*    GFR: Estimated Creatinine Clearance: 48.4 mL/min (A) (by C-G formula based on SCr of 1.74 mg/dL (H)).  Liver Function Tests: Recent Labs  Lab 09/22/21 1657 09/23/21 0335  AST 42* 40  ALT 28 31  ALKPHOS 176* 150*  BILITOT 1.2 1.0  PROT 9.2* 7.9  ALBUMIN 4.7 3.9   No results for input(s): LIPASE, AMYLASE in the last 168 hours. No results for input(s): AMMONIA in the last 168 hours.  Coagulation Profile: No results for input(s): INR, PROTIME in the last 168 hours.  Cardiac Enzymes: No results for input(s): CKTOTAL, CKMB, CKMBINDEX, TROPONINI in the last 168 hours.  BNP (last 3 results) No results for input(s): PROBNP in the last 8760 hours.  Lipid Profile: No results for input(s): CHOL, HDL, LDLCALC, TRIG, CHOLHDL, LDLDIRECT in the last 72 hours.  Thyroid Function Tests: Recent Labs    09/23/21 0335  TSH 1.172  FREET4 0.97    Anemia Panel: No results for input(s): VITAMINB12, FOLATE, FERRITIN, TIBC, IRON, RETICCTPCT in the last 72 hours.  Urine analysis:    Component Value Date/Time   COLORURINE YELLOW 11/16/2020 0015   APPEARANCEUR CLEAR 11/16/2020 0015   LABSPEC 1.044 (H) 11/16/2020 0015   PHURINE 5.0 11/16/2020 0015   GLUCOSEU NEGATIVE 11/16/2020 0015   HGBUR NEGATIVE 11/16/2020 0015   BILIRUBINUR NEGATIVE 11/16/2020 0015   KETONESUR NEGATIVE 11/16/2020 0015   PROTEINUR NEGATIVE 11/16/2020 0015   NITRITE NEGATIVE 11/16/2020 0015   LEUKOCYTESUR NEGATIVE 11/16/2020 0015    Sepsis Labs: Lactic Acid, Venous    Component Value Date/Time   LATICACIDVEN 1.1 12/01/2020 0321    MICROBIOLOGY: Recent Results (from the past 240 hour(s))  Resp Panel by RT-PCR (Flu A&B, Covid) Nasopharyngeal Swab     Status: None    Collection Time: 09/22/21  4:56 PM   Specimen: Nasopharyngeal Swab; Nasopharyngeal(NP) swabs in vial transport medium  Result Value Ref Range Status   SARS Coronavirus 2 by RT PCR NEGATIVE NEGATIVE Final    Comment: (NOTE) SARS-CoV-2 target nucleic acids are NOT DETECTED.  The SARS-CoV-2 RNA is generally detectable in upper respiratory specimens during the acute phase of infection. The lowest concentration of SARS-CoV-2 viral copies this assay can detect is 138 copies/mL. A negative result does not preclude SARS-Cov-2 infection and should not be used as the sole basis for treatment or other patient management decisions. A negative result may occur with  improper specimen collection/handling, submission of specimen other than nasopharyngeal swab, presence of viral mutation(s) within the areas targeted by this assay, and inadequate number of viral copies(<138 copies/mL). A negative result must be combined with clinical observations, patient history, and epidemiological information. The expected result is Negative.  Fact Sheet for Patients:  EntrepreneurPulse.com.au  Fact Sheet for Healthcare Providers:  IncredibleEmployment.be  This test is no t yet approved or cleared  by the [place] and  has been authorized for detection and/or diagnosis of SARS-CoV-2 by FDA under an Emergency Use Authorization (EUA). This EUA will remain  in effect (meaning this test can be used) for the duration of the COVID-19 declaration under Section 564(b)(1) of the Act, 21 U.S.C.section 360bbb-3(b)(1), unless the authorization is terminated  or revoked sooner.       Influenza A by PCR NEGATIVE NEGATIVE Final   Influenza B by PCR NEGATIVE NEGATIVE Final    Comment: (NOTE) The Xpert Xpress SARS-CoV-2/FLU/RSV plus assay is intended as an aid in the diagnosis of influenza from Nasopharyngeal swab specimens and should not be used as a sole basis for treatment.  Nasal washings and aspirates are unacceptable for Xpert Xpress SARS-CoV-2/FLU/RSV testing.  Fact Sheet for Patients: EntrepreneurPulse.com.au  Fact Sheet for Healthcare Providers: IncredibleEmployment.be  This test is not yet approved or cleared by the [place] FDA and has been authorized for detection and/or diagnosis of SARS-CoV-2 by FDA under an Emergency Use Authorization (EUA). This EUA will remain in effect (meaning this test can be used) for the duration of the COVID-19 declaration under Section 564(b)(1) of the Act, 21 U.S.C. section 360bbb-3(b)(1), unless the authorization is terminated or revoked.  Performed at [facility], [place] [address] [place], [place] [address]     RADIOLOGY STUDIES/RESULTS: DG Chest 1 View  Result Date: 09/22/2021 CLINICAL DATA:  Shortness of breath EXAM: CHEST  1 VIEW COMPARISON:  12/12/2020 FINDINGS: Right-sided central venous port tip over the SVC. No focal opacity, pleural effusion or pneumothorax. Normal cardiomediastinal silhouette. No pneumothorax. IMPRESSION: No active disease. Electronically Signed   By: Donavan Foil M.D.   On: 09/22/2021 20:08   VAS [place] LOWER EXTREMITY VENOUS (DVT)  Result Date: 09/23/2021  Lower Venous DVT Study Patient Name:  Eric [name]  Date of Exam:   09/23/2021 Medical Rec #: [id number]     Accession #:    [id number] Date of Birth: 04-05-1971     Patient Gender: M Patient Age:   62 years Exam Location:  [facility] Procedure:      VAS [place] LOWER EXTREMITY VENOUS (DVT) Referring Phys: Oren Binet --------------------------------------------------------------------------------  Indications: Swelling, and Edema.  Comparison Study: no prior Performing Technologist: Archie Patten RVS  Examination Guidelines: A complete evaluation includes B-mode imaging, spectral Doppler, color Doppler, and power Doppler as needed of all accessible portions of each vessel. Bilateral  testing is considered an integral part of a complete examination. Limited examinations for reoccurring indications may be performed as noted. The reflux portion of the exam is performed with the patient in reverse Trendelenburg.  +---------+---------------+---------+-----------+----------+--------------+  RIGHT     Compressibility Phasicity Spontaneity Properties Thrombus Aging  +---------+---------------+---------+-----------+----------+--------------+  CFV       Full            Yes       Yes                                    +---------+---------------+---------+-----------+----------+--------------+  SFJ       Full                                                             +---------+---------------+---------+-----------+----------+--------------+  FV Prox   Full                                                             +---------+---------------+---------+-----------+----------+--------------+  FV Mid    Full                                                             +---------+---------------+---------+-----------+----------+--------------+  FV Distal Full                                                             +---------+---------------+---------+-----------+----------+--------------+  PFV       Full                                                             +---------+---------------+---------+-----------+----------+--------------+  POP       Full            Yes       Yes                                    +---------+---------------+---------+-----------+----------+--------------+  PTV       Full                                                             +---------+---------------+---------+-----------+----------+--------------+  PERO      Full                                                             +---------+---------------+---------+-----------+----------+--------------+   +---------+---------------+---------+-----------+----------+--------------+  LEFT       Compressibility Phasicity Spontaneity Properties Thrombus Aging  +---------+---------------+---------+-----------+----------+--------------+  CFV       Full            Yes       Yes                                    +---------+---------------+---------+-----------+----------+--------------+  SFJ       Full                                                             +---------+---------------+---------+-----------+----------+--------------+  FV Prox   Full                                                             +---------+---------------+---------+-----------+----------+--------------+  FV Mid    Full                                                             +---------+---------------+---------+-----------+----------+--------------+  FV Distal Full                                                             +---------+---------------+---------+-----------+----------+--------------+  PFV       Full                                                             +---------+---------------+---------+-----------+----------+--------------+  POP       Full            Yes       Yes                                    +---------+---------------+---------+-----------+----------+--------------+  PTV       Full                                                             +---------+---------------+---------+-----------+----------+--------------+  PERO      Full                                                             +---------+---------------+---------+-----------+----------+--------------+     Summary: BILATERAL: - No evidence of deep vein thrombosis seen in the lower extremities, bilaterally. -No evidence of popliteal cyst, bilaterally.   *See table(s) above for measurements and observations.    Preliminary      LOS: 0 days   Oren Binet, MD  [facility]    To contact the attending provider between 7A-7P or the covering provider during after hours 7P-7A, please log into the web site [email or web address] and  access using universal [place] password for that web site. If you do not have the password, please call the hospital operator.  09/23/2021, 10:01 AM

## 2021-09-23 NOTE — ED Notes (Signed)
Patient transported to Nuc Med 

## 2021-09-23 NOTE — ED Notes (Signed)
Pt reports severe heartburn; no changes noted on cardiac monitor. Admitting MD notified.

## 2021-09-23 NOTE — Progress Notes (Signed)
Lower extremity venous has been completed.   Preliminary results in CV Proc.   Eric [name] 09/23/2021 8:49 AM

## 2021-09-23 NOTE — ED Notes (Signed)
Patient transported to Ultrasound 

## 2021-09-24 ENCOUNTER — Other Ambulatory Visit (HOSPITAL_COMMUNITY): Payer: Self-pay

## 2021-09-24 LAB — BASIC METABOLIC PANEL
Anion gap: 4 — ABNORMAL LOW (ref 5–15)
BUN: 29 mg/dL — ABNORMAL HIGH (ref 6–20)
CO2: 24 mmol/L (ref 22–32)
Calcium: 8.3 mg/dL — ABNORMAL LOW (ref 8.9–10.3)
Chloride: 103 mmol/L (ref 98–111)
Creatinine, Ser: 1.11 mg/dL (ref 0.61–1.24)
GFR, Estimated: >60 mL/min (ref >60)
Glucose, Bld: 113 mg/dL — ABNORMAL HIGH (ref 70–99)
Potassium: 4.1 mmol/L (ref 3.5–5.1)
Sodium: 131 mmol/L — ABNORMAL LOW (ref 135–145)

## 2021-09-24 MED ORDER — PANTOPRAZOLE SODIUM 40 MG PO TBEC
40.0000 mg | DELAYED_RELEASE_TABLET | Freq: Every day | ORAL | 2 refills | Status: DC
  Filled 2021-09-24: qty 30, 30d supply, fill #0

## 2021-09-24 MED ORDER — DIPHENOXYLATE-ATROPINE 2.5-0.025 MG PO TABS
1.0000 | ORAL_TABLET | Freq: Two times a day (BID) | ORAL | Status: DC
Start: 2021-09-24 — End: 2021-09-24
  Administered 2021-09-24: 10:00:00 1 via ORAL
  Filled 2021-09-24: qty 1

## 2021-09-24 MED ORDER — FLUTICASONE FUROATE-VILANTEROL 100-25 MCG/ACT IN AEPB
1.0000 | INHALATION_SPRAY | Freq: Every day | RESPIRATORY_TRACT | 0 refills | Status: DC
  Filled 2021-09-24: qty 60, 30d supply, fill #0

## 2021-09-24 MED ORDER — ALBUTEROL SULFATE (2.5 MG/3ML) 0.083% IN NEBU: 2.5000 mg | INHALATION_SOLUTION | RESPIRATORY_TRACT | Status: DC | PRN

## 2021-09-24 MED ORDER — ALBUTEROL SULFATE HFA 108 (90 BASE) MCG/ACT IN AERS
2.0000 | INHALATION_SPRAY | Freq: Four times a day (QID) | RESPIRATORY_TRACT | 0 refills | Status: DC | PRN
  Filled 2021-09-24: qty 18, 25d supply, fill #0

## 2021-09-24 MED ORDER — FLUTICASONE FUROATE-VILANTEROL 100-25 MCG/ACT IN AEPB
1.0000 | INHALATION_SPRAY | Freq: Every day | RESPIRATORY_TRACT | Status: DC
  Filled 2021-09-24: qty 28

## 2021-09-24 MED ORDER — DIPHENOXYLATE-ATROPINE 2.5-0.025 MG PO TABS
1.0000 | ORAL_TABLET | Freq: Two times a day (BID) | ORAL | 0 refills | Status: DC | PRN
  Filled 2021-09-24: qty 30, 15d supply, fill #0

## 2021-09-24 NOTE — Progress Notes (Signed)
Patient discharged to home with instructions and medications. 

## 2021-09-24 NOTE — Discharge Summary (Signed)
PATIENT DETAILS Name: Eric [name] Age: 50 y.o. Sex: male Date of Birth: 1971/07/27 MRN: [id number]. Admitting Physician: Jonetta Osgood, MD [id number], [facility]  Admit Date: 09/22/2021 Discharge date: 09/24/2021  Recommendations for Outpatient Follow-up:  Follow up with PCP in 1-2 weeks Please obtain CMP/CBC in one week  Admitted From:  Home  Disposition: [place]: No  Equipment/Devices: None  Discharge Condition: Stable  CODE STATUS: FULL CODE  Diet recommendation:  Diet Order             Diet general           Diet regular Room service appropriate? Yes; Fluid consistency: Thin  Diet effective now                    Brief Summary: Patient is a 50 y.o. male with history of metastatic colon CA-on Xeloda-s/p colostomy-presented with exertional dyspnea, AKI and hyponatremia.  See below for further details.  Brief Hospital Course: Exertional dyspnea: Lungs sound clear-CXR negative for infiltrates-VQ scan negative for PE-lower extremity Dopplers negative for DVT.  Echo with stable EF.  Suspect that this may have been from some dehydration and probably underlying undiagnosed COPD.  He was ambulated in the hallway today-he feels much better-he is on room air and clearly is not hypoxic. Suspect he could be discharged home with outpatient with his primary care practitioner/primary oncologist.  He will probably benefit from an outpatient pulmonary referral for formal PFTs.  We will empirically put him on some inhalers till he can be evaluated by pulmonology.   AKI: Suspect due to increased ostomy output-creatinine improved with IVF and decreased ostomy output.     Hyponatremia: I suspect this is from increased ostomy output/dehydration-sodium levels have improved with hydration and decrease ostomy.   Increased ostomy output: Significantly better after being started on Lomotil-continue as needed Lomotil in the outpatient setting.    Colon cancer-s/p colectomy-ostomy in place-with mets to the liver: On Xeloda.   GERD: Continue PPI   Underweight: Estimated body mass index is 19 kg/m as calculated from the following:   Height as of this encounter: 6\' 3"  (1.905 m).   Weight as of this encounter: 68.9 kg.    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)   AKI (acute kidney injury) (HCC)   Dyspnea on exertion   SOB (shortness of breath)   Discharge Instructions:  Activity:  As tolerated   Discharge Instructions     Ambulatory referral to Pulmonology   Complete by: As directed    Reason for referral: Asthma/COPD   Call MD for:  difficulty breathing, headache or visual disturbances   Complete by: As directed    Diet general   Complete by: As directed    Increase activity slowly   Complete by: As directed       Allergies as of 09/24/2021   No Known Allergies      Medication List     STOP taking these medications    diazepam 5 MG tablet Commonly known as: VALIUM       TAKE these medications    Acetaminophen Extra Strength 500 MG tablet Generic drug: acetaminophen TAKE 2 TABLETS (1,000 MG TOTAL) BY MOUTH EVERY SIX HOURS. What changed:  how much to take how to take this when  to take this reasons to take this   albuterol 108 (90 Base) MCG/ACT inhaler Commonly known as: VENTOLIN HFA Inhale 2 puffs into the lungs every 6 (six) hours as needed for wheezing or shortness of breath.   capecitabine 500 MG tablet Commonly known as: XELODA Take 2 tablets by mouth every morning and 1 tablet by mouth every evening (Take #2 tablets (1000 mg) every morning and #1 tablet (500 mg) every evening) What changed:  how much to take how to take this when to take this additional instructions   diphenoxylate-atropine 2.5-0.025 MG  tablet Commonly known as: LOMOTIL Take 1 tablet by mouth 2 (two) times daily as needed (increased ostomy output).   feeding supplement Liqd Take 237 mLs by mouth 3 (three) times daily between meals.   fluticasone furoate-vilanterol 100-25 MCG/ACT Aepb Commonly known as: BREO ELLIPTA Inhale 1 puff into the lungs daily. Start taking on: September 25, 2021   gabapentin 300 MG capsule Commonly known as: NEURONTIN TAKE 1 CAPSULE BY MOUTH THREE TIMES A DAY What changed:  how much to take how to take this when to take this additional instructions   lidocaine-prilocaine cream Commonly known as: EMLA Apply to port site 1-2 hours prior to stick and cover with plastic wrap to numb site as directed.   ondansetron 8 MG tablet Commonly known as: ZOFRAN TAKE 1 TABLET BY MOUTH EVERY 8 HOURS AS NEEDED FOR NAUSEA OR VOMITING. BEGIN 72 HOURS AFTER EACH IV CHEMO SESSION What changed:  how much to take how to take this when to take this reasons to take this additional instructions   Oxycodone HCl 10 MG Tabs Take 1/2 to 1 tablet by mouth every 6 hours as needed (1/2 tablet for moderate pain, and 1 tablet for severe pain).   pantoprazole 40 MG tablet Commonly known as: PROTONIX Take 1 tablet (40 mg total) by mouth daily.   polycarbophil 625 MG tablet Commonly known as: FIBERCON Take 1 tablet (625 mg total) by mouth daily.   prochlorperazine 10 MG tablet Commonly known as: COMPAZINE Take 1 tablet (10 mg total) by mouth every 6 (six) hours as needed for nausea.        Follow-up Information     Practice, [facility]. Schedule an appointment as soon as possible for a visit in 1 week(s).   Specialty: Family Medicine Contact information: [place] [address] [phone]         Ladell Pier, MD. Schedule an appointment as soon as possible for a visit in 1 week(s).   Specialty: Oncology Contact information: [place] [address] [phone]                No Known Allergies    Consultations: None   Other Procedures/Studies: DG Chest 1 View  Result Date: 09/22/2021 CLINICAL DATA:  Shortness of breath EXAM: CHEST  1 VIEW COMPARISON:  12/12/2020 FINDINGS: Right-sided central venous port tip over the SVC. No focal opacity, pleural effusion or pneumothorax. Normal cardiomediastinal silhouette. No pneumothorax. IMPRESSION: No active disease. Electronically Signed   By: Donavan Foil M.D.   On: 09/22/2021 20:08   NM PULMONARY VENT AND PERF (V/Q Scan)  Result Date: 09/23/2021 CLINICAL DATA:  Suspected pulmonary embolism in a 50 year old male. EXAM: NUCLEAR MEDICINE PERFUSION LUNG SCAN TECHNIQUE: Perfusion images were obtained in multiple projections after intravenous injection of radiopharmaceutical. Ventilation scans intentionally deferred if perfusion scan and chest x-ray adequate for interpretation during COVID 19  epidemic. RADIOPHARMACEUTICALS:  4.2 mCi Tc-55m MAA IV COMPARISON:  Chest x-ray of September 22, 2021. FINDINGS: No segmental perfusion defect to suggest pulmonary embolism. Generalized diminished perfusion of the upper lobes. In general mildly heterogeneous perfusion throughout. IMPRESSION: Findings of mildly heterogeneous perfusion and generalized diminished perfusion more pronounced in the upper lobes. Findings more suggestive of changes related to COPD with low probability of pulmonary embolus. Electronically Signed   By: Zetta Bills M.D.   On: 09/23/2021 13:06   ECHOCARDIOGRAM COMPLETE  Result Date: 09/23/2021    ECHOCARDIOGRAM REPORT   Patient Name:   DEMTRIUS ROUNDS Date of Exam: 09/23/2021 Medical Rec #:  [id number]    Height:       75.0 in Accession #:    [id number]   Weight:       152.0 lb Date of Birth:  May 03, 1971    BSA:          1.953 m Patient Age:    48 years     BP:           142/67 mmHg Patient Gender: M            HR:           87 bpm. Exam Location:   Inpatient Procedure: 2D Echo, Cardiac Doppler and Color Doppler Indications:    dyspnea  History:        Patient has no prior history of Echocardiogram examinations.                 Signs/Symptoms:Dyspnea; Risk Factors:Hypertension and Current                 Smoker. Colon Cancer.  Sonographer:    Melissa Morford RDCS (AE, PE) Referring Phys: [place] Comments: Technically challenging study due to limited acoustic windows, Technically difficult study due to poor echo windows, no parasternal window and no apical window. IMPRESSIONS  1. Left ventricular ejection fraction, by estimation, is 65 to 70%. The left ventricle has normal function. Left ventricular endocardial border not optimally defined to evaluate regional wall motion. Left ventricular diastolic function could not be evaluated.  2. Right ventricular systolic function is normal. The right ventricular size is normal.  3. The mitral valve is normal in structure. No evidence of mitral valve regurgitation.  4. The aortic valve is tricuspid. Aortic valve regurgitation is not visualized. No aortic stenosis is present.  5. The inferior vena cava is normal in size with greater than 50% respiratory variability, suggesting right atrial pressure of 3 mmHg. Comparison(s): Very limited study (subcostal views only), but most of the necessary information could be obtained and no major structural or functional abnormalities were seen. FINDINGS  Left Ventricle: Left ventricular ejection fraction, by estimation, is 65 to 70%. The left ventricle has normal function. Left ventricular endocardial border not optimally defined to evaluate regional wall motion. The left ventricular internal cavity size was normal in size. There is no left ventricular hypertrophy. Left ventricular diastolic function could not be evaluated. Right Ventricle: The right ventricular size is normal. No increase in right ventricular wall thickness. Right ventricular systolic  function is normal. Left Atrium: Left atrial size was normal in size. Right Atrium: Right atrial size was normal in size. Pericardium: There is no evidence of pericardial effusion. Mitral Valve: The mitral valve is normal in structure. No evidence of mitral valve regurgitation. Tricuspid Valve: The tricuspid valve is normal in structure. Tricuspid valve regurgitation is not demonstrated. Aortic Valve:  The aortic valve is tricuspid. Aortic valve regurgitation is not visualized. No aortic stenosis is present. Pulmonic Valve: The pulmonic valve was not well visualized. Pulmonic valve regurgitation is not visualized. Aorta: The aortic root was not well visualized. Venous: The inferior vena cava is normal in size with greater than 50% respiratory variability, suggesting right atrial pressure of 3 mmHg. IAS/Shunts: No atrial level shunt detected by color flow Doppler. Sanda Klein MD Electronically signed by Sanda Klein MD Signature Date/Time: 09/23/2021/10:18:36 AM    Final    VAS [place] LOWER EXTREMITY VENOUS (DVT)  Result Date: 09/23/2021  Lower Venous DVT Study Patient Name:  CHAMPION CORALES  Date of Exam:   09/23/2021 Medical Rec #: [id number]     Accession #:    [id number] Date of Birth: March 30, 1971     Patient Gender: M Patient Age:   32 years Exam Location:  [facility] Procedure:      VAS [place] LOWER EXTREMITY VENOUS (DVT) Referring Phys: Oren Binet --------------------------------------------------------------------------------  Indications: Swelling, and Edema.  Comparison Study: no prior Performing Technologist: Archie Patten RVS  Examination Guidelines: A complete evaluation includes B-mode imaging, spectral Doppler, color Doppler, and power Doppler as needed of all accessible portions of each vessel. Bilateral testing is considered an integral part of a complete examination. Limited examinations for reoccurring indications may be performed as noted. The reflux portion of the exam is performed  with the patient in reverse Trendelenburg.  +---------+---------------+---------+-----------+----------+--------------+  RIGHT     Compressibility Phasicity Spontaneity Properties Thrombus Aging  +---------+---------------+---------+-----------+----------+--------------+  CFV       Full            Yes       Yes                                    +---------+---------------+---------+-----------+----------+--------------+  SFJ       Full                                                             +---------+---------------+---------+-----------+----------+--------------+  FV Prox   Full                                                             +---------+---------------+---------+-----------+----------+--------------+  FV Mid    Full                                                             +---------+---------------+---------+-----------+----------+--------------+  FV Distal Full                                                             +---------+---------------+---------+-----------+----------+--------------+  PFV       Full                                                             +---------+---------------+---------+-----------+----------+--------------+  POP       Full            Yes       Yes                                    +---------+---------------+---------+-----------+----------+--------------+  PTV       Full                                                             +---------+---------------+---------+-----------+----------+--------------+  PERO      Full                                                             +---------+---------------+---------+-----------+----------+--------------+   +---------+---------------+---------+-----------+----------+--------------+  LEFT      Compressibility Phasicity Spontaneity Properties Thrombus Aging  +---------+---------------+---------+-----------+----------+--------------+  CFV       Full            Yes       Yes                                     +---------+---------------+---------+-----------+----------+--------------+  SFJ       Full                                                             +---------+---------------+---------+-----------+----------+--------------+  FV Prox   Full                                                             +---------+---------------+---------+-----------+----------+--------------+  FV Mid    Full                                                             +---------+---------------+---------+-----------+----------+--------------+  FV Distal Full                                                             +---------+---------------+---------+-----------+----------+--------------+  PFV       Full                                                             +---------+---------------+---------+-----------+----------+--------------+  POP       Full            Yes       Yes                                    +---------+---------------+---------+-----------+----------+--------------+  PTV       Full                                                             +---------+---------------+---------+-----------+----------+--------------+  PERO      Full                                                             +---------+---------------+---------+-----------+----------+--------------+     Summary: BILATERAL: - No evidence of deep vein thrombosis seen in the lower extremities, bilaterally. -No evidence of popliteal cyst, bilaterally.   *See table(s) above for measurements and observations. Electronically signed by Deitra Mayo MD on 09/23/2021 at 10:16:15 AM.    Final      TODAY-DAY OF DISCHARGE:  Subjective:   Eric [name] today has no headache,no chest abdominal pain,no new weakness tingling or numbness, feels much better wants to go home today.   Objective:   Blood pressure 116/77, pulse 77, temperature 98.1 F (36.7 C), temperature source Oral, resp. rate 16, height 6\' 3"  (1.905 m), weight 68.6 kg, SpO2 99  %.  Intake/Output Summary (Last 24 hours) at 09/24/2021 1027 Last data filed at 09/24/2021 0732 Gross per 24 hour  Intake 1916.81 ml  Output 500 ml  Net 1416.81 ml   Filed Weights   09/22/21 1652 09/24/21 0434  Weight: 68.9 kg 68.6 kg    Exam: Awake Alert, Oriented *3, No new F.N deficits, Normal affect Ferdinand.AT,PERRAL Supple Neck,No JVD, No cervical lymphadenopathy appriciated.  Symmetrical Chest wall movement, Good air movement bilaterally, CTAB RRR,No Gallops,Rubs or new Murmurs, No Parasternal Heave +ve B.Sounds, Abd Soft, Non tender, No organomegaly appriciated, No rebound -guarding or rigidity. No Cyanosis, Clubbing or edema, No new Rash or bruise   PERTINENT RADIOLOGIC STUDIES: DG Chest 1 View  Result Date: 09/22/2021 CLINICAL DATA:  Shortness of breath EXAM: CHEST  1 VIEW COMPARISON:  12/12/2020 FINDINGS: Right-sided central venous port tip over the SVC. No focal opacity, pleural effusion or pneumothorax. Normal cardiomediastinal silhouette. No pneumothorax. IMPRESSION: No active disease. Electronically Signed   By: Donavan Foil M.D.   On: 09/22/2021 20:08   NM PULMONARY VENT AND PERF (V/Q Scan)  Result Date: 09/23/2021 CLINICAL DATA:  Suspected pulmonary embolism in a 50 year old male. EXAM: NUCLEAR MEDICINE PERFUSION LUNG SCAN TECHNIQUE: Perfusion images were obtained in  multiple projections after intravenous injection of radiopharmaceutical. Ventilation scans intentionally deferred if perfusion scan and chest x-ray adequate for interpretation during COVID 19 epidemic. RADIOPHARMACEUTICALS:  4.2 mCi Tc-53m MAA IV COMPARISON:  Chest x-ray of September 22, 2021. FINDINGS: No segmental perfusion defect to suggest pulmonary embolism. Generalized diminished perfusion of the upper lobes. In general mildly heterogeneous perfusion throughout. IMPRESSION: Findings of mildly heterogeneous perfusion and generalized diminished perfusion more pronounced in the upper lobes. Findings more  suggestive of changes related to COPD with low probability of pulmonary embolus. Electronically Signed   By: Zetta Bills M.D.   On: 09/23/2021 13:06   ECHOCARDIOGRAM COMPLETE  Result Date: 09/23/2021    ECHOCARDIOGRAM REPORT   Patient Name:   TRAEGER SULTANA Date of Exam: 09/23/2021 Medical Rec #:  [id number]    Height:       75.0 in Accession #:    [id number]   Weight:       152.0 lb Date of Birth:  [birth year]    BSA:          1.953 m Patient Age:    56 years     BP:           142/67 mmHg Patient Gender: M            HR:           87 bpm. Exam Location:  Inpatient Procedure: 2D Echo, Cardiac Doppler and Color Doppler Indications:    dyspnea  History:        Patient has no prior history of Echocardiogram examinations.                 Signs/Symptoms:Dyspnea; Risk Factors:Hypertension and Current                 Smoker. Colon Cancer.  Sonographer:    Melissa Morford RDCS (AE, PE) Referring Phys: [place] Comments: Technically challenging study due to limited acoustic windows, Technically difficult study due to poor echo windows, no parasternal window and no apical window. IMPRESSIONS  1. Left ventricular ejection fraction, by estimation, is 65 to 70%. The left ventricle has normal function. Left ventricular endocardial border not optimally defined to evaluate regional wall motion. Left ventricular diastolic function could not be evaluated.  2. Right ventricular systolic function is normal. The right ventricular size is normal.  3. The mitral valve is normal in structure. No evidence of mitral valve regurgitation.  4. The aortic valve is tricuspid. Aortic valve regurgitation is not visualized. No aortic stenosis is present.  5. The inferior vena cava is normal in size with greater than 50% respiratory variability, suggesting right atrial pressure of 3 mmHg. Comparison(s): Very limited study (subcostal views only), but most of the necessary information could be obtained and no major  structural or functional abnormalities were seen. FINDINGS  Left Ventricle: Left ventricular ejection fraction, by estimation, is 65 to 70%. The left ventricle has normal function. Left ventricular endocardial border not optimally defined to evaluate regional wall motion. The left ventricular internal cavity size was normal in size. There is no left ventricular hypertrophy. Left ventricular diastolic function could not be evaluated. Right Ventricle: The right ventricular size is normal. No increase in right ventricular wall thickness. Right ventricular systolic function is normal. Left Atrium: Left atrial size was normal in size. Right Atrium: Right atrial size was normal in size. Pericardium: There is no evidence of pericardial effusion. Mitral Valve: The mitral valve is normal in structure.  No evidence of mitral valve regurgitation. Tricuspid Valve: The tricuspid valve is normal in structure. Tricuspid valve regurgitation is not demonstrated. Aortic Valve: The aortic valve is tricuspid. Aortic valve regurgitation is not visualized. No aortic stenosis is present. Pulmonic Valve: The pulmonic valve was not well visualized. Pulmonic valve regurgitation is not visualized. Aorta: The aortic root was not well visualized. Venous: The inferior vena cava is normal in size with greater than 50% respiratory variability, suggesting right atrial pressure of 3 mmHg. IAS/Shunts: No atrial level shunt detected by color flow Doppler. Sanda Klein MD Electronically signed by Sanda Klein MD Signature Date/Time: 09/23/2021/10:18:36 AM    Final    VAS [place] LOWER EXTREMITY VENOUS (DVT)  Result Date: 09/23/2021  Lower Venous DVT Study Patient Name:  ALEJANDRA BARNA  Date of Exam:   09/23/2021 Medical Rec #: [id number]     Accession #:    [id number] Date of Birth: 30-Aug-1971     Patient Gender: M Patient Age:   1 years Exam Location:  [facility] Procedure:      VAS [place] LOWER EXTREMITY VENOUS (DVT) Referring Phys: Oren Binet --------------------------------------------------------------------------------  Indications: Swelling, and Edema.  Comparison Study: no prior Performing Technologist: Archie Patten RVS  Examination Guidelines: A complete evaluation includes B-mode imaging, spectral Doppler, color Doppler, and power Doppler as needed of all accessible portions of each vessel. Bilateral testing is considered an integral part of a complete examination. Limited examinations for reoccurring indications may be performed as noted. The reflux portion of the exam is performed with the patient in reverse Trendelenburg.  +---------+---------------+---------+-----------+----------+--------------+  RIGHT     Compressibility Phasicity Spontaneity Properties Thrombus Aging  +---------+---------------+---------+-----------+----------+--------------+  CFV       Full            Yes       Yes                                    +---------+---------------+---------+-----------+----------+--------------+  SFJ       Full                                                             +---------+---------------+---------+-----------+----------+--------------+  FV Prox   Full                                                             +---------+---------------+---------+-----------+----------+--------------+  FV Mid    Full                                                             +---------+---------------+---------+-----------+----------+--------------+  FV Distal Full                                                             +---------+---------------+---------+-----------+----------+--------------+  PFV       Full                                                             +---------+---------------+---------+-----------+----------+--------------+  POP       Full            Yes       Yes                                    +---------+---------------+---------+-----------+----------+--------------+  PTV       Full                                                              +---------+---------------+---------+-----------+----------+--------------+  PERO      Full                                                             +---------+---------------+---------+-----------+----------+--------------+   +---------+---------------+---------+-----------+----------+--------------+  LEFT      Compressibility Phasicity Spontaneity Properties Thrombus Aging  +---------+---------------+---------+-----------+----------+--------------+  CFV       Full            Yes       Yes                                    +---------+---------------+---------+-----------+----------+--------------+  SFJ       Full                                                             +---------+---------------+---------+-----------+----------+--------------+  FV Prox   Full                                                             +---------+---------------+---------+-----------+----------+--------------+  FV Mid    Full                                                             +---------+---------------+---------+-----------+----------+--------------+  FV Distal Full                                                             +---------+---------------+---------+-----------+----------+--------------+  PFV       Full                                                             +---------+---------------+---------+-----------+----------+--------------+  POP       Full            Yes       Yes                                    +---------+---------------+---------+-----------+----------+--------------+  PTV       Full                                                             +---------+---------------+---------+-----------+----------+--------------+  PERO      Full                                                             +---------+---------------+---------+-----------+----------+--------------+     Summary: BILATERAL: - No evidence of deep vein thrombosis seen in the lower extremities,  bilaterally. -No evidence of popliteal cyst, bilaterally.   *See table(s) above for measurements and observations. Electronically signed by Deitra Mayo MD on 09/23/2021 at 10:16:15 AM.    Final      PERTINENT LAB RESULTS: CBC: Recent Labs    09/22/21 1657 09/23/21 0335  WBC 14.2* 12.7*  HGB 16.0 14.0  HCT 45.0 40.3  PLT 321 231   CMET CMP     Component Value Date/Time   NA 131 (L) 09/24/2021 0519   K 4.1 09/24/2021 0519   CL 103 09/24/2021 0519   CO2 24 09/24/2021 0519   GLUCOSE 113 (H) 09/24/2021 0519   BUN 29 (H) 09/24/2021 0519   CREATININE 1.11 09/24/2021 0519   CREATININE 1.13 09/16/2021 0908   CALCIUM 8.3 (L) 09/24/2021 0519   PROT 7.9 09/23/2021 0335   ALBUMIN 3.9 09/23/2021 0335   AST 40 09/23/2021 0335   AST 18 09/16/2021 0908   ALT 31 09/23/2021 0335   ALT 14 09/16/2021 0908   ALKPHOS 150 (H) 09/23/2021 0335   BILITOT 1.0 09/23/2021 0335   BILITOT 1.2 09/16/2021 0908   GFRNONAA >60 09/24/2021 0519   GFRNONAA >60 09/16/2021 0908    GFR Estimated Creatinine Clearance: 75.5 mL/min (by C-G formula based on SCr of 1.11 mg/dL). No results for input(s): LIPASE, AMYLASE in the last 72 hours. No results for input(s): CKTOTAL, CKMB, CKMBINDEX, TROPONINI in the last 72 hours. Invalid input(s): [place]    09/22/21 2055  DDIMER 0.83*   No results for input(s): HGBA1C in the last 72 hours. No results for input(s): CHOL, HDL, LDLCALC, TRIG, CHOLHDL, LDLDIRECT in the last 72 hours. Recent Labs    09/23/21 0335  TSH 1.172   No results for input(s): VITAMINB12, FOLATE, FERRITIN, TIBC, IRON, RETICCTPCT in the last 72  hours. Coags: No results for input(s): INR in the last 72 hours.  Invalid input(s): PT Microbiology: Recent Results (from the past 240 hour(s))  Resp Panel by RT-PCR (Flu A&B, Covid) Nasopharyngeal Swab     Status: None   Collection Time: 09/22/21  4:56 PM   Specimen: Nasopharyngeal Swab; Nasopharyngeal(NP) swabs in vial  transport medium  Result Value Ref Range Status   SARS Coronavirus 2 by RT PCR NEGATIVE NEGATIVE Final    Comment: (NOTE) SARS-CoV-2 target nucleic acids are NOT DETECTED.  The SARS-CoV-2 RNA is generally detectable in upper respiratory specimens during the acute phase of infection. The lowest concentration of SARS-CoV-2 viral copies this assay can detect is 138 copies/mL. A negative result does not preclude SARS-Cov-2 infection and should not be used as the sole basis for treatment or other patient management decisions. A negative result may occur with  improper specimen collection/handling, submission of specimen other than nasopharyngeal swab, presence of viral mutation(s) within the areas targeted by this assay, and inadequate number of viral copies(<138 copies/mL). A negative result must be combined with clinical observations, patient history, and epidemiological information. The expected result is Negative.  Fact Sheet for Patients:  EntrepreneurPulse.com.au  Fact Sheet for Healthcare Providers:  IncredibleEmployment.be  This test is no t yet approved or cleared by the [place] FDA and  has been authorized for detection and/or diagnosis of SARS-CoV-2 by FDA under an Emergency Use Authorization (EUA). This EUA will remain  in effect (meaning this test can be used) for the duration of the COVID-19 declaration under Section 564(b)(1) of the Act, 21 U.S.C.section 360bbb-3(b)(1), unless the authorization is terminated  or revoked sooner.       Influenza A by PCR NEGATIVE NEGATIVE Final   Influenza B by PCR NEGATIVE NEGATIVE Final    Comment: (NOTE) The Xpert Xpress SARS-CoV-2/FLU/RSV plus assay is intended as an aid in the diagnosis of influenza from Nasopharyngeal swab specimens and should not be used as a sole basis for treatment. Nasal washings and aspirates are unacceptable for Xpert Xpress SARS-CoV-2/FLU/RSV testing.  Fact  Sheet for Patients: EntrepreneurPulse.com.au  Fact Sheet for Healthcare Providers: IncredibleEmployment.be  This test is not yet approved or cleared by the [place] FDA and has been authorized for detection and/or diagnosis of SARS-CoV-2 by FDA under an Emergency Use Authorization (EUA). This EUA will remain in effect (meaning this test can be used) for the duration of the COVID-19 declaration under Section 564(b)(1) of the Act, 21 U.S.C. section 360bbb-3(b)(1), unless the authorization is terminated or revoked.  Performed at [facility], [place] [address] [place], [place] [address]     FURTHER DISCHARGE INSTRUCTIONS:  Get Medicines reviewed and adjusted: Please take all your medications with you for your next visit with your Primary MD  Laboratory/radiological data: Please request your Primary MD to go over all hospital tests and procedure/radiological results at the follow up, please ask your Primary MD to get all Hospital records sent to his/her office.  In some cases, they will be blood work, cultures and biopsy results pending at the time of your discharge. Please request that your primary care M.D. goes through all the records of your hospital data and follows up on these results.  Also Note the following: If you experience worsening of your admission symptoms, develop shortness of breath, life threatening emergency, suicidal or homicidal thoughts you must seek medical attention immediately by calling 911 or calling your MD immediately  if symptoms less severe.  You  must read complete instructions/literature along with all the possible adverse reactions/side effects for all the Medicines you take and that have been prescribed to you. Take any new Medicines after you have completely understood and accpet all the possible adverse reactions/side effects.   Do not drive when taking Pain medications or sleeping medications  (Benzodaizepines)  Do not take more than prescribed Pain, Sleep and Anxiety Medications. It is not advisable to combine anxiety,sleep and pain medications without talking with your primary care practitioner  Special Instructions: If you have smoked or chewed Tobacco  in the last 2 yrs please stop smoking, stop any regular Alcohol  and or any Recreational drug use.  Wear Seat belts while driving.  Please note: You were cared for by a hospitalist during your hospital stay. Once you are discharged, your primary care physician will handle any further medical issues. Please note that NO REFILLS for any discharge medications will be authorized once you are discharged, as it is imperative that you return to your primary care physician (or establish a relationship with a primary care physician if you do not have one) for your post hospital discharge needs so that they can reassess your need for medications and monitor your lab values.  Total Time spent coordinating discharge including counseling, education and face to face time equals 35 minutes.  SignedOren Binet 09/24/2021 10:27 AM

## 2021-09-26 ENCOUNTER — Other Ambulatory Visit (HOSPITAL_COMMUNITY): Payer: Self-pay

## 2021-09-29 ENCOUNTER — Other Ambulatory Visit (HOSPITAL_COMMUNITY): Payer: Self-pay

## 2021-10-07 ENCOUNTER — Inpatient Hospital Stay: Payer: 59

## 2021-10-07 ENCOUNTER — Other Ambulatory Visit: Payer: Self-pay

## 2021-10-07 ENCOUNTER — Inpatient Hospital Stay (HOSPITAL_BASED_OUTPATIENT_CLINIC_OR_DEPARTMENT_OTHER): Payer: 59 | Admitting: Oncology

## 2021-10-07 VITALS — BP 125/90 | HR 100 | Temp 98.4°F | Resp 20 | Ht 75.0 in | Wt 153.0 lb

## 2021-10-07 DIAGNOSIS — C189 Malignant neoplasm of colon, unspecified: Secondary | ICD-10-CM

## 2021-10-07 DIAGNOSIS — C787 Secondary malignant neoplasm of liver and intrahepatic bile duct: Secondary | ICD-10-CM | POA: Diagnosis not present

## 2021-10-07 DIAGNOSIS — K631 Perforation of intestine (nontraumatic): Secondary | ICD-10-CM | POA: Diagnosis not present

## 2021-10-07 DIAGNOSIS — D509 Iron deficiency anemia, unspecified: Secondary | ICD-10-CM | POA: Diagnosis not present

## 2021-10-07 DIAGNOSIS — K6819 Other retroperitoneal abscess: Secondary | ICD-10-CM | POA: Diagnosis not present

## 2021-10-07 DIAGNOSIS — Z9049 Acquired absence of other specified parts of digestive tract: Secondary | ICD-10-CM | POA: Diagnosis not present

## 2021-10-07 DIAGNOSIS — C181 Malignant neoplasm of appendix: Secondary | ICD-10-CM | POA: Diagnosis present

## 2021-10-07 DIAGNOSIS — Z79899 Other long term (current) drug therapy: Secondary | ICD-10-CM | POA: Diagnosis not present

## 2021-10-07 DIAGNOSIS — E46 Unspecified protein-calorie malnutrition: Secondary | ICD-10-CM | POA: Diagnosis not present

## 2021-10-07 DIAGNOSIS — C184 Malignant neoplasm of transverse colon: Secondary | ICD-10-CM | POA: Diagnosis present

## 2021-10-07 DIAGNOSIS — A419 Sepsis, unspecified organism: Secondary | ICD-10-CM | POA: Diagnosis not present

## 2021-10-07 DIAGNOSIS — D75839 Thrombocytosis, unspecified: Secondary | ICD-10-CM | POA: Diagnosis not present

## 2021-10-07 DIAGNOSIS — T451X5A Adverse effect of antineoplastic and immunosuppressive drugs, initial encounter: Secondary | ICD-10-CM | POA: Diagnosis not present

## 2021-10-07 DIAGNOSIS — R0600 Dyspnea, unspecified: Secondary | ICD-10-CM | POA: Diagnosis not present

## 2021-10-07 DIAGNOSIS — Z800 Family history of malignant neoplasm of digestive organs: Secondary | ICD-10-CM | POA: Diagnosis not present

## 2021-10-07 DIAGNOSIS — J449 Chronic obstructive pulmonary disease, unspecified: Secondary | ICD-10-CM | POA: Diagnosis not present

## 2021-10-07 DIAGNOSIS — G620 Drug-induced polyneuropathy: Secondary | ICD-10-CM | POA: Diagnosis not present

## 2021-10-07 DIAGNOSIS — Z8616 Personal history of COVID-19: Secondary | ICD-10-CM | POA: Diagnosis not present

## 2021-10-07 LAB — CBC WITH DIFFERENTIAL (CANCER CENTER ONLY)
Abs Immature Granulocytes: 0.03 10*3/uL (ref 0.00–0.07)
Basophils Absolute: 0 10*3/uL (ref 0.0–0.1)
Basophils Relative: 1 %
Eosinophils Absolute: 0.3 10*3/uL (ref 0.0–0.5)
Eosinophils Relative: 6 %
HCT: 31 % — ABNORMAL LOW (ref 39.0–52.0)
Hemoglobin: 10.5 g/dL — ABNORMAL LOW (ref 13.0–17.0)
Immature Granulocytes: 1 %
Lymphocytes Relative: 19 %
Lymphs Abs: 1.1 10*3/uL (ref 0.7–4.0)
MCH: 34.4 pg — ABNORMAL HIGH (ref 26.0–34.0)
MCHC: 33.9 g/dL (ref 30.0–36.0)
MCV: 101.6 fL — ABNORMAL HIGH (ref 80.0–100.0)
Monocytes Absolute: 0.5 10*3/uL (ref 0.1–1.0)
Monocytes Relative: 9 %
Neutro Abs: 3.8 10*3/uL (ref 1.7–7.7)
Neutrophils Relative %: 64 %
Platelet Count: 160 10*3/uL (ref 150–400)
RBC: 3.05 MIL/uL — ABNORMAL LOW (ref 4.22–5.81)
RDW: 15.9 % — ABNORMAL HIGH (ref 11.5–15.5)
WBC Count: 5.8 10*3/uL (ref 4.0–10.5)
nRBC: 0 % (ref 0.0–0.2)

## 2021-10-07 LAB — CMP (CANCER CENTER ONLY)
ALT: 24 U/L (ref 0–44)
AST: 26 U/L (ref 15–41)
Albumin: 3.9 g/dL (ref 3.5–5.0)
Alkaline Phosphatase: 122 U/L (ref 38–126)
Anion gap: 7 (ref 5–15)
BUN: 11 mg/dL (ref 6–20)
CO2: 25 mmol/L (ref 22–32)
Calcium: 9.1 mg/dL (ref 8.9–10.3)
Chloride: 106 mmol/L (ref 98–111)
Creatinine: 0.94 mg/dL (ref 0.61–1.24)
GFR, Estimated: >60 mL/min (ref >60)
Glucose, Bld: 145 mg/dL — ABNORMAL HIGH (ref 70–99)
Potassium: 3.5 mmol/L (ref 3.5–5.1)
Sodium: 138 mmol/L (ref 135–145)
Total Bilirubin: 0.6 mg/dL (ref 0.3–1.2)
Total Protein: 6.8 g/dL (ref 6.5–8.1)

## 2021-10-07 MED ORDER — SODIUM CHLORIDE 0.9% FLUSH
10.0000 mL | Freq: Once | INTRAVENOUS | Status: AC
  Administered 2021-10-07: 15:00:00 10 mL via INTRAVENOUS

## 2021-10-07 MED ORDER — SODIUM CHLORIDE 0.9 % IV SOLN: INTRAVENOUS | Status: DC

## 2021-10-07 MED ORDER — HEPARIN SOD (PORK) LOCK FLUSH 100 UNIT/ML IV SOLN
500.0000 [IU] | Freq: Once | INTRAVENOUS | Status: AC
  Administered 2021-10-07: 15:00:00 500 [IU] via INTRAVENOUS

## 2021-10-07 NOTE — Progress Notes (Signed)
[facility] OFFICE PROGRESS NOTE   Diagnosis: Colon cancer  INTERVAL HISTORY:   Eric. Danielson returns as scheduled.  He continues Xeloda.  He has mild discomfort in the hands.  He continues to have dryness in the hands and feet.  The feet have improved.  No nausea.  He has intermittent loose stool, improved with Lomotil. He was admitted 09/22/2021 with dyspnea.  A V/q. scan was low probability for pulmonary embolism.  A chest x-ray revealed no active disease.  He was noted to have an elevated creatinine felt to be related to increased ostomy output. The dyspnea was felt to be related to dehydration and a COPD flare.  He reports inhalers have helped the dyspnea.  He is still working on getting an appointment with Dr. Rosendo Gros to consider reversal of the ileostomy. Objective:  Vital signs in last 24 hours:  Blood pressure 125/90, pulse 100, temperature 98.4 F (36.9 C), temperature source Oral, resp. rate 20, height _0  (1.905 m), weight 153 lb (69.4 kg), SpO2 100 %.    HEENT: No thrush or ulcers Resp: Bronchial sounds in the right posterior chest, no respiratory distress Cardio: Regular rate and rhythm GI: No hepatomegaly, nontender, right abdomen ileostomy Vascular: No leg edema  Skin: Dryness and hyperpigmentation of the hands and feet with superficial desquamation Lab Results:  Lab Results  Component Value Date   WBC 5.8 10/07/2021   HGB 10.5 (L) 10/07/2021   HCT 31.0 (L) 10/07/2021   MCV 101.6 (H) 10/07/2021   PLT 160 10/07/2021   NEUTROABS 3.8 10/07/2021    CMP  Lab Results  Component Value Date   NA 131 (L) 09/24/2021   K 4.1 09/24/2021   CL 103 09/24/2021   CO2 24 09/24/2021   GLUCOSE 113 (H) 09/24/2021   BUN 29 (H) 09/24/2021   CREATININE 1.11 09/24/2021   CALCIUM 8.3 (L) 09/24/2021   PROT 7.9 09/23/2021   ALBUMIN 3.9 09/23/2021   AST 40 09/23/2021   ALT 31 09/23/2021   ALKPHOS 150 (H) 09/23/2021   BILITOT 1.0 09/23/2021   GFRNONAA >60  09/24/2021    Lab Results  Component Value Date   CEA1 0.8 11/19/2020   CEA 2.47 06/09/2021     Medications: I have reviewed the patient's current medications.   Assessment/Plan: Stage IV adenocarcinoma of the colon -11/15/2020 CT abdomen/pelvis with contrast-long segment masslike mural thickening of the cecum, posterior perforation at the site of the cecal wall thickening with multilocular right retroperitoneal abscess, indeterminate hypodensity in the inferior right lobe of the liver. -11/19/2020 CEA 0.8 -11/20/2020 MRI of the abdomen and pelvis with and without contrast-3.5 x 2.5 cm heterogeneous enhancing lesion in the inferior peripheral right liver with 2 smaller lesions with similar features. -11/20/2020 ultrasound-guided liver biopsy consistent with adenocarcinoma.  Foundation 1-MSS, tumor mutation burden 8,KRAS wt -11/25/2020 CT abdomen/pelvis with contrast-interval placement of a right lower quadrant percutaneous pigtail drainage catheter with near resolution of the retroperitoneal fluid collection, redemonstrated large fungating mass at the cecal base measuring 11.9 x 9.1 x 8.4 cm with enlarged lymph nodes in the right lower quadrant mesocolon. -11/28/2020 exploratory laparotomy, right colectomy, ileostomy creation, cystoscopy with stent placement             -Surgical pathology showed adenocarcinoma, moderately differentiated, 12 cm, 0/14+ lymph nodes, lymphovascular space invasion present, radial margin involved by invasive adenocarcinoma, appendix with intraluminal             adenocarcinoma, pT4, PN 0 -12/09/2020 CT  abdomen/pelvis with contrast-interval right colectomy and right lower quadrant ileostomy, stable right retroperitoneal soft tissue density along the psoas and iliacus muscles, mild progression of liver metastases since prior study. -Cycle 1 FOLFOX 12/24/2020 -Cycle 2 FOLFOX 01/06/2021 -Cycle 3 FOLFOX 01/20/2021 -Cycle 4 FOLFOX 02/03/2021 -Cycle 5 FOLFOX 02/17/2021 -Cycle 6  FOLFOX 03/03/2021 -Restaging CTs 03/05/2021- substantial interval decrease in size of predominantly hypodense lesions throughout the liver.  Significant interval resolution of previously seen extensive, heterogeneous soft tissue in the right retroperitoneal soft tissues and paracolic gutter overlying the psoas and iliacus muscles. -Cycle 7 FOLFOX 03/19/2021 -Cycle 8 FOLFOX 04/03/2021, oxaliplatin dose reduced secondary to neutropenia and thrombocytopenia -Cycle 9 FOLFOX 04/15/2021 -Cycle 10 FOLFOX 04/29/2021 -Cycle 11 FOLFOX 05/13/2021, oxaliplatin held secondary to neuropathy -Cycle 12 FOLFOX 05/27/2021, oxaliplatin held secondary to neuropathy -CT abdomen/pelvis 06/09/2021-decrease size of remaining dominant liver lesion, other liver lesions are calcified and punctate, subtle density in the posterior right bladder, unchanged sclerotic right iliac lesion -Cycle 13 FOLFOX 06/10/2021, oxaliplatin held secondary to neuropathy -Maintenance Xeloda 06/25/2021 -MRI liver 07/03/2021-no significant change in subtle irregular nonenhancing remnant liver lesions of the inferior right lobe of the liver measuring 1.2 x 0.6 cm and adjacent to the gallbladder fossa measuring no greater than 0.5 cm. -Maintenance Xeloda continued -Tumor conference 08/06/2021-rescan at a 27-monthinterval -Xeloda decreased to 1000 mg a.m., 500 mg p.m. 08/05/2021 (due to hand-foot syndrome)   2.  Sepsis secondary to perforated colon 3.  Retroperitoneal abscess 4.  Protein calorie malnutrition 5.  Thrombocytosis 6.  Iron deficiency anemia 7.  COVID-19 infection, 11/15/2020 8.  Tobacco abuse 9.  Family history of colon cancer 10.  Port-A-Cath placement, Dr. 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  Admission 09/22/2021 with dehydration and probable COPD flare         Disposition: Eric HRabideauappears stable.  He continues Xeloda maintenance.  He will undergo a  restaging MRI prior to an office visit in 1 month. He has recovered from the recent hospital admission with dehydration and dyspnea.  He most likely became dehydrated from a high output ileostomy.  This is complicated by Xeloda therapy.  He will continue Lomotil as needed.    GBetsy Coder MD  10/07/2021  3:52 PM

## 2021-10-23 ENCOUNTER — Other Ambulatory Visit: Payer: Self-pay | Admitting: Oncology

## 2021-10-23 ENCOUNTER — Other Ambulatory Visit (HOSPITAL_COMMUNITY): Payer: Self-pay

## 2021-10-23 DIAGNOSIS — C189 Malignant neoplasm of colon, unspecified: Secondary | ICD-10-CM

## 2021-10-23 DIAGNOSIS — C787 Secondary malignant neoplasm of liver and intrahepatic bile duct: Secondary | ICD-10-CM

## 2021-10-23 MED FILL — Capecitabine Tab 500 MG: ORAL | 30 days supply | Qty: 90 | Fill #0 | Status: AC

## 2021-10-27 ENCOUNTER — Telehealth: Payer: Self-pay

## 2021-10-27 ENCOUNTER — Other Ambulatory Visit (HOSPITAL_COMMUNITY): Payer: Self-pay

## 2021-10-27 ENCOUNTER — Other Ambulatory Visit: Payer: Self-pay

## 2021-10-27 MED ORDER — DIAZEPAM 5 MG PO TABS
ORAL_TABLET | ORAL | 0 refills | Status: DC
  Filled 2021-10-27: qty 1, 1d supply, fill #0

## 2021-10-27 MED ORDER — DIAZEPAM 5 MG PO TABS: ORAL_TABLET | ORAL | 0 refills | Status: DC

## 2021-10-27 NOTE — Telephone Encounter (Signed)
Phone in a prescription for Valium 5mg  tablet Prior to MRI. Called and spoke with the patient sister to inform her the prescription will be ready on 10/28/21. Patient sister voiced understanding

## 2021-10-28 ENCOUNTER — Other Ambulatory Visit (HOSPITAL_COMMUNITY): Payer: Self-pay

## 2021-10-28 NOTE — Telephone Encounter (Signed)
Chart review.

## 2021-10-30 ENCOUNTER — Ambulatory Visit (HOSPITAL_COMMUNITY): Payer: 59

## 2021-10-31 ENCOUNTER — Ambulatory Visit (HOSPITAL_COMMUNITY)
Admission: RE | Admit: 2021-10-31 | Discharge: 2021-10-31 | Disposition: A | Discharge status: Home / Self Care | Payer: 59 | Source: Ambulatory Visit | Attending: Oncology | Admitting: Oncology

## 2021-10-31 DIAGNOSIS — C189 Malignant neoplasm of colon, unspecified: Secondary | ICD-10-CM | POA: Insufficient documentation

## 2021-10-31 DIAGNOSIS — C787 Secondary malignant neoplasm of liver and intrahepatic bile duct: Secondary | ICD-10-CM | POA: Diagnosis present

## 2021-10-31 MED ORDER — GADOBUTROL 1 MMOL/ML IV SOLN
7.5000 mL | Freq: Once | INTRAVENOUS | Status: AC | PRN
  Administered 2021-10-31: 7.5 mL via INTRAVENOUS

## 2021-11-04 ENCOUNTER — Other Ambulatory Visit: Payer: 59

## 2021-11-04 ENCOUNTER — Encounter: Payer: Self-pay | Admitting: Nurse Practitioner

## 2021-11-04 ENCOUNTER — Other Ambulatory Visit (HOSPITAL_COMMUNITY): Payer: Self-pay

## 2021-11-04 ENCOUNTER — Ambulatory Visit: Payer: 59 | Admitting: Oncology

## 2021-11-04 ENCOUNTER — Inpatient Hospital Stay (HOSPITAL_BASED_OUTPATIENT_CLINIC_OR_DEPARTMENT_OTHER): Payer: 59 | Admitting: Nurse Practitioner

## 2021-11-04 ENCOUNTER — Inpatient Hospital Stay: Payer: 59 | Attending: Oncology

## 2021-11-04 ENCOUNTER — Other Ambulatory Visit: Payer: Self-pay

## 2021-11-04 ENCOUNTER — Inpatient Hospital Stay: Payer: 59

## 2021-11-04 VITALS — BP 128/93 | HR 89 | Temp 97.8°F | Resp 19 | Ht 75.0 in | Wt 160.2 lb

## 2021-11-04 DIAGNOSIS — T451X5A Adverse effect of antineoplastic and immunosuppressive drugs, initial encounter: Secondary | ICD-10-CM | POA: Insufficient documentation

## 2021-11-04 DIAGNOSIS — Z9049 Acquired absence of other specified parts of digestive tract: Secondary | ICD-10-CM | POA: Insufficient documentation

## 2021-11-04 DIAGNOSIS — A4189 Other specified sepsis: Secondary | ICD-10-CM | POA: Diagnosis not present

## 2021-11-04 DIAGNOSIS — G620 Drug-induced polyneuropathy: Secondary | ICD-10-CM | POA: Diagnosis not present

## 2021-11-04 DIAGNOSIS — C787 Secondary malignant neoplasm of liver and intrahepatic bile duct: Secondary | ICD-10-CM

## 2021-11-04 DIAGNOSIS — E46 Unspecified protein-calorie malnutrition: Secondary | ICD-10-CM | POA: Insufficient documentation

## 2021-11-04 DIAGNOSIS — K6819 Other retroperitoneal abscess: Secondary | ICD-10-CM | POA: Insufficient documentation

## 2021-11-04 DIAGNOSIS — C184 Malignant neoplasm of transverse colon: Secondary | ICD-10-CM | POA: Insufficient documentation

## 2021-11-04 DIAGNOSIS — Z8616 Personal history of COVID-19: Secondary | ICD-10-CM | POA: Diagnosis not present

## 2021-11-04 DIAGNOSIS — C189 Malignant neoplasm of colon, unspecified: Secondary | ICD-10-CM

## 2021-11-04 DIAGNOSIS — Z95828 Presence of other vascular implants and grafts: Secondary | ICD-10-CM

## 2021-11-04 DIAGNOSIS — D75839 Thrombocytosis, unspecified: Secondary | ICD-10-CM | POA: Diagnosis not present

## 2021-11-04 DIAGNOSIS — K631 Perforation of intestine (nontraumatic): Secondary | ICD-10-CM | POA: Insufficient documentation

## 2021-11-04 DIAGNOSIS — D509 Iron deficiency anemia, unspecified: Secondary | ICD-10-CM | POA: Insufficient documentation

## 2021-11-04 DIAGNOSIS — Z800 Family history of malignant neoplasm of digestive organs: Secondary | ICD-10-CM | POA: Insufficient documentation

## 2021-11-04 DIAGNOSIS — C181 Malignant neoplasm of appendix: Secondary | ICD-10-CM | POA: Insufficient documentation

## 2021-11-04 DIAGNOSIS — Z79899 Other long term (current) drug therapy: Secondary | ICD-10-CM | POA: Diagnosis not present

## 2021-11-04 LAB — CMP (CANCER CENTER ONLY)
ALT: 11 U/L (ref 0–44)
AST: 17 U/L (ref 15–41)
Albumin: 4.1 g/dL (ref 3.5–5.0)
Alkaline Phosphatase: 127 U/L — ABNORMAL HIGH (ref 38–126)
Anion gap: 7 (ref 5–15)
BUN: 15 mg/dL (ref 6–20)
CO2: 26 mmol/L (ref 22–32)
Calcium: 9.4 mg/dL (ref 8.9–10.3)
Chloride: 103 mmol/L (ref 98–111)
Creatinine: 1.03 mg/dL (ref 0.61–1.24)
GFR, Estimated: >60 mL/min (ref >60)
Glucose, Bld: 111 mg/dL — ABNORMAL HIGH (ref 70–99)
Potassium: 3.7 mmol/L (ref 3.5–5.1)
Sodium: 136 mmol/L (ref 135–145)
Total Bilirubin: 0.6 mg/dL (ref 0.3–1.2)
Total Protein: 7.1 g/dL (ref 6.5–8.1)

## 2021-11-04 LAB — CBC WITH DIFFERENTIAL (CANCER CENTER ONLY)
Abs Immature Granulocytes: 0.02 10*3/uL (ref 0.00–0.07)
Basophils Absolute: 0 10*3/uL (ref 0.0–0.1)
Basophils Relative: 1 %
Eosinophils Absolute: 0.3 10*3/uL (ref 0.0–0.5)
Eosinophils Relative: 5 %
HCT: 32.3 % — ABNORMAL LOW (ref 39.0–52.0)
Hemoglobin: 10.6 g/dL — ABNORMAL LOW (ref 13.0–17.0)
Immature Granulocytes: 0 %
Lymphocytes Relative: 19 %
Lymphs Abs: 1.1 10*3/uL (ref 0.7–4.0)
MCH: 32.8 pg (ref 26.0–34.0)
MCHC: 32.8 g/dL (ref 30.0–36.0)
MCV: 100 fL (ref 80.0–100.0)
Monocytes Absolute: 0.6 10*3/uL (ref 0.1–1.0)
Monocytes Relative: 10 %
Neutro Abs: 3.9 10*3/uL (ref 1.7–7.7)
Neutrophils Relative %: 65 %
Platelet Count: 170 10*3/uL (ref 150–400)
RBC: 3.23 MIL/uL — ABNORMAL LOW (ref 4.22–5.81)
RDW: 14.3 % (ref 11.5–15.5)
WBC Count: 6 10*3/uL (ref 4.0–10.5)
nRBC: 0 % (ref 0.0–0.2)

## 2021-11-04 MED ORDER — HEPARIN SOD (PORK) LOCK FLUSH 100 UNIT/ML IV SOLN
500.0000 [IU] | Freq: Once | INTRAVENOUS | Status: AC
  Administered 2021-11-04: 13:00:00 500 [IU] via INTRAVENOUS

## 2021-11-04 MED ORDER — PANTOPRAZOLE SODIUM 40 MG PO TBEC
40.0000 mg | DELAYED_RELEASE_TABLET | Freq: Every day | ORAL | 2 refills | Status: DC
  Filled 2021-11-04: qty 30, 30d supply, fill #0

## 2021-11-04 MED ORDER — DIPHENOXYLATE-ATROPINE 2.5-0.025 MG PO TABS
1.0000 | ORAL_TABLET | Freq: Two times a day (BID) | ORAL | 0 refills | Status: DC | PRN
  Filled 2021-11-04: qty 30, 15d supply, fill #0

## 2021-11-04 MED ORDER — GABAPENTIN 300 MG PO CAPS
ORAL_CAPSULE | ORAL | 1 refills | Status: DC
  Filled 2021-11-04: qty 90, 30d supply, fill #0
  Filled 2021-12-11: qty 90, 30d supply, fill #1

## 2021-11-04 MED ORDER — OXYCODONE HCL 10 MG PO TABS
5.0000 mg | ORAL_TABLET | Freq: Four times a day (QID) | ORAL | 0 refills | Status: DC | PRN
  Filled 2021-11-04: qty 25, 13d supply, fill #0

## 2021-11-04 NOTE — Progress Notes (Signed)
[facility] OFFICE PROGRESS NOTE   Diagnosis: Colon cancer  INTERVAL HISTORY:   [name] returns as scheduled.  He continue Xeloda.  He is feeling well.  No nausea or vomiting.  No mouth sores.  No diarrhea.  Hands continue to be dry.  Stable neuropathy symptoms.  Objective:  Vital signs in last 24 hours:  Blood pressure (!) 128/93, pulse 89, temperature 97.8 F (36.6 C), temperature source Oral, resp. rate 19, height $RemoveBe'6\' 3"'OZvJGAbey$  (1.905 m), weight 160 lb 3.2 oz (72.7 kg), SpO2 100 %.    HEENT: No thrush or ulcers. Resp: Lungs clear bilaterally. Cardio: Regular rate and rhythm. GI: Abdomen soft and nontender.  No hepatomegaly. Vascular: No leg edema. Skin: Palms with mild erythema, skin thickening, dryness.  Soles are dry appearing. Port-A-Cath without erythema.   Lab Results:  Lab Results  Component Value Date   WBC 6.0 11/04/2021   HGB 10.6 (L) 11/04/2021   HCT 32.3 (L) 11/04/2021   MCV 100.0 11/04/2021   PLT 170 11/04/2021   NEUTROABS 3.9 11/04/2021    Imaging:  No results found.  Medications: I have reviewed the patient's current medications.  Assessment/Plan: Stage IV adenocarcinoma of the colon -11/15/2020 CT abdomen/pelvis with contrast-long segment masslike mural thickening of the cecum, posterior perforation at the site of the cecal wall thickening with multilocular right retroperitoneal abscess, indeterminate hypodensity in the inferior right lobe of the liver. -11/19/2020 CEA 0.8 -11/20/2020 MRI of the abdomen and pelvis with and without contrast-3.5 x 2.5 cm heterogeneous enhancing lesion in the inferior peripheral right liver with 2 smaller lesions with similar features. -11/20/2020 ultrasound-guided liver biopsy consistent with adenocarcinoma.  Foundation 1-MSS, tumor mutation burden 8,KRAS wt -11/25/2020 CT abdomen/pelvis with contrast-interval placement of a right lower quadrant percutaneous pigtail drainage catheter with near resolution of the  retroperitoneal fluid collection, redemonstrated large fungating mass at the cecal base measuring 11.9 x 9.1 x 8.4 cm with enlarged lymph nodes in the right lower quadrant mesocolon. -11/28/2020 exploratory laparotomy, right colectomy, ileostomy creation, cystoscopy with stent placement             -Surgical pathology showed adenocarcinoma, moderately differentiated, 12 cm, 0/14+ lymph nodes, lymphovascular space invasion present, radial margin involved by invasive adenocarcinoma, appendix with intraluminal             adenocarcinoma, pT4, PN 0 -12/09/2020 CT abdomen/pelvis with contrast-interval right colectomy and right lower quadrant ileostomy, stable right retroperitoneal soft tissue density along the psoas and iliacus muscles, mild progression of liver metastases since prior study. -Cycle 1 FOLFOX 12/24/2020 -Cycle 2 FOLFOX 01/06/2021 -Cycle 3 FOLFOX 01/20/2021 -Cycle 4 FOLFOX 02/03/2021 -Cycle 5 FOLFOX 02/17/2021 -Cycle 6 FOLFOX 03/03/2021 -Restaging CTs 03/05/2021- substantial interval decrease in size of predominantly hypodense lesions throughout the liver.  Significant interval resolution of previously seen extensive, heterogeneous soft tissue in the right retroperitoneal soft tissues and paracolic gutter overlying the psoas and iliacus muscles. -Cycle 7 FOLFOX 03/19/2021 -Cycle 8 FOLFOX 04/03/2021, oxaliplatin dose reduced secondary to neutropenia and thrombocytopenia -Cycle 9 FOLFOX 04/15/2021 -Cycle 10 FOLFOX 04/29/2021 -Cycle 11 FOLFOX 05/13/2021, oxaliplatin held secondary to neuropathy -Cycle 12 FOLFOX 05/27/2021, oxaliplatin held secondary to neuropathy -CT abdomen/pelvis 06/09/2021-decrease size of remaining dominant liver lesion, other liver lesions are calcified and punctate, subtle density in the posterior right bladder, unchanged sclerotic right iliac lesion -Cycle 13 FOLFOX 06/10/2021, oxaliplatin held secondary to neuropathy -Maintenance Xeloda 06/25/2021 -MRI liver 07/03/2021-no significant  change in subtle irregular nonenhancing remnant liver lesions of the inferior  right lobe of the liver measuring 1.2 x 0.6 cm and adjacent to the gallbladder fossa measuring no greater than 0.5 cm. -Maintenance Xeloda continued -Tumor conference 08/06/2021-rescan at a 29-month interval -Xeloda decreased to 1000 mg a.m., 500 mg p.m. 08/05/2021 (due to hand-foot syndrome) -MRI liver 10/31/2021-treated liver lesion within the inferior right hepatic lobe measuring 0.9 cm.  Small treated lesion within the posterior medial right hepatic lobe measuring 5 mm, unchanged. -Maintenance Xeloda continued   2.  Sepsis secondary to perforated colon 3.  Retroperitoneal abscess 4.  Protein calorie malnutrition 5.  Thrombocytosis 6.  Iron deficiency anemia 7.  COVID-19 infection, 11/15/2020 8.  Tobacco abuse 9.  Family history of colon cancer 10.  Port-A-Cath placement, Dr. 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  Admission 09/22/2021 with dehydration and probable COPD flare      Disposition: [name] appears stable.  He is on maintenance Xeloda.  Recent MRI of the liver showed 2 small stable liver lesions, no new lesions.  Dr. Benay Spice reviewed the MRI results with him at today's visit.  He will continue maintenance Xeloda.  His case will be presented at the GI tumor conference, question if he is a candidate for resection or ablation.  We reviewed the CBC and chemistry panel from today.  Labs adequate to continue Xeloda as he is currently taking.  He will return for follow-up in approximately 3 weeks.  He will contact the office in the interim with any problems.  Patient seen with Dr. Benay Spice.    Ned Card ANP/GNP-BC   11/04/2021  2:28 PM This was a shared visit with Ned Card.  We reviewed the MRI findings with [name].  He will continue Xeloda maintenance.  I will present his case again at the GI tumor conference  to discuss reversal of the ileostomy and hepatic directed therapy for the remaining liver lesions.  I was present for greater than 50% of today's visit.  I performed medical decision making.  Julieanne Manson, MD

## 2021-11-10 ENCOUNTER — Other Ambulatory Visit (HOSPITAL_COMMUNITY): Payer: Self-pay

## 2021-11-12 ENCOUNTER — Other Ambulatory Visit: Payer: Self-pay

## 2021-11-12 NOTE — Progress Notes (Signed)
The proposed treatment discussed in conference is for discussion purpose only and is not a binding recommendation.  The patients have not been physically examined, or presented with their treatment options.  Therefore, final treatment plans cannot be decided.  

## 2021-11-17 ENCOUNTER — Other Ambulatory Visit: Payer: Self-pay | Admitting: Oncology

## 2021-11-17 ENCOUNTER — Other Ambulatory Visit (HOSPITAL_COMMUNITY): Payer: Self-pay

## 2021-11-17 DIAGNOSIS — C189 Malignant neoplasm of colon, unspecified: Secondary | ICD-10-CM

## 2021-11-18 ENCOUNTER — Other Ambulatory Visit (HOSPITAL_COMMUNITY): Payer: Self-pay

## 2021-11-18 MED ORDER — CAPECITABINE 500 MG PO TABS
ORAL_TABLET | ORAL | 0 refills | Status: DC
  Filled 2021-11-18: qty 90, 30d supply, fill #0

## 2021-11-24 ENCOUNTER — Other Ambulatory Visit (HOSPITAL_COMMUNITY): Payer: Self-pay

## 2021-11-25 ENCOUNTER — Inpatient Hospital Stay (HOSPITAL_BASED_OUTPATIENT_CLINIC_OR_DEPARTMENT_OTHER): Payer: 59 | Admitting: Nurse Practitioner

## 2021-11-25 ENCOUNTER — Other Ambulatory Visit: Payer: Self-pay

## 2021-11-25 ENCOUNTER — Encounter: Payer: Self-pay | Admitting: Nurse Practitioner

## 2021-11-25 ENCOUNTER — Other Ambulatory Visit: Payer: Self-pay | Admitting: *Deleted

## 2021-11-25 ENCOUNTER — Other Ambulatory Visit (HOSPITAL_COMMUNITY): Payer: Self-pay

## 2021-11-25 ENCOUNTER — Inpatient Hospital Stay: Payer: 59

## 2021-11-25 ENCOUNTER — Inpatient Hospital Stay: Payer: 59 | Attending: Oncology

## 2021-11-25 VITALS — BP 130/100 | HR 96 | Temp 97.9°F | Resp 20 | Ht 75.0 in | Wt 162.6 lb

## 2021-11-25 DIAGNOSIS — T451X5A Adverse effect of antineoplastic and immunosuppressive drugs, initial encounter: Secondary | ICD-10-CM | POA: Diagnosis not present

## 2021-11-25 DIAGNOSIS — Z800 Family history of malignant neoplasm of digestive organs: Secondary | ICD-10-CM | POA: Diagnosis not present

## 2021-11-25 DIAGNOSIS — C189 Malignant neoplasm of colon, unspecified: Secondary | ICD-10-CM

## 2021-11-25 DIAGNOSIS — C787 Secondary malignant neoplasm of liver and intrahepatic bile duct: Secondary | ICD-10-CM | POA: Diagnosis not present

## 2021-11-25 DIAGNOSIS — D509 Iron deficiency anemia, unspecified: Secondary | ICD-10-CM | POA: Insufficient documentation

## 2021-11-25 DIAGNOSIS — A4189 Other specified sepsis: Secondary | ICD-10-CM | POA: Diagnosis not present

## 2021-11-25 DIAGNOSIS — Z95828 Presence of other vascular implants and grafts: Secondary | ICD-10-CM

## 2021-11-25 DIAGNOSIS — Z8616 Personal history of COVID-19: Secondary | ICD-10-CM | POA: Insufficient documentation

## 2021-11-25 DIAGNOSIS — C184 Malignant neoplasm of transverse colon: Secondary | ICD-10-CM | POA: Diagnosis present

## 2021-11-25 DIAGNOSIS — D75839 Thrombocytosis, unspecified: Secondary | ICD-10-CM | POA: Insufficient documentation

## 2021-11-25 DIAGNOSIS — E46 Unspecified protein-calorie malnutrition: Secondary | ICD-10-CM | POA: Insufficient documentation

## 2021-11-25 DIAGNOSIS — Z79899 Other long term (current) drug therapy: Secondary | ICD-10-CM | POA: Insufficient documentation

## 2021-11-25 DIAGNOSIS — G620 Drug-induced polyneuropathy: Secondary | ICD-10-CM | POA: Insufficient documentation

## 2021-11-25 DIAGNOSIS — K6819 Other retroperitoneal abscess: Secondary | ICD-10-CM | POA: Diagnosis not present

## 2021-11-25 DIAGNOSIS — C181 Malignant neoplasm of appendix: Secondary | ICD-10-CM | POA: Insufficient documentation

## 2021-11-25 LAB — CBC WITH DIFFERENTIAL (CANCER CENTER ONLY)
Abs Immature Granulocytes: 0.02 10*3/uL (ref 0.00–0.07)
Basophils Absolute: 0.1 10*3/uL (ref 0.0–0.1)
Basophils Relative: 1 %
Eosinophils Absolute: 0.3 10*3/uL (ref 0.0–0.5)
Eosinophils Relative: 5 %
HCT: 30.9 % — ABNORMAL LOW (ref 39.0–52.0)
Hemoglobin: 10.2 g/dL — ABNORMAL LOW (ref 13.0–17.0)
Immature Granulocytes: 0 %
Lymphocytes Relative: 23 %
Lymphs Abs: 1.3 10*3/uL (ref 0.7–4.0)
MCH: 32.3 pg (ref 26.0–34.0)
MCHC: 33 g/dL (ref 30.0–36.0)
MCV: 97.8 fL (ref 80.0–100.0)
Monocytes Absolute: 0.7 10*3/uL (ref 0.1–1.0)
Monocytes Relative: 12 %
Neutro Abs: 3.3 10*3/uL (ref 1.7–7.7)
Neutrophils Relative %: 59 %
Platelet Count: 155 10*3/uL (ref 150–400)
RBC: 3.16 MIL/uL — ABNORMAL LOW (ref 4.22–5.81)
RDW: 14.8 % (ref 11.5–15.5)
WBC Count: 5.7 10*3/uL (ref 4.0–10.5)
nRBC: 0 % (ref 0.0–0.2)

## 2021-11-25 LAB — CMP (CANCER CENTER ONLY)
ALT: 13 U/L (ref 0–44)
AST: 17 U/L (ref 15–41)
Albumin: 4.2 g/dL (ref 3.5–5.0)
Alkaline Phosphatase: 130 U/L — ABNORMAL HIGH (ref 38–126)
Anion gap: 8 (ref 5–15)
BUN: 15 mg/dL (ref 6–20)
CO2: 25 mmol/L (ref 22–32)
Calcium: 9.2 mg/dL (ref 8.9–10.3)
Chloride: 104 mmol/L (ref 98–111)
Creatinine: 0.95 mg/dL (ref 0.61–1.24)
GFR, Estimated: >60 mL/min (ref >60)
Glucose, Bld: 106 mg/dL — ABNORMAL HIGH (ref 70–99)
Potassium: 3.5 mmol/L (ref 3.5–5.1)
Sodium: 137 mmol/L (ref 135–145)
Total Bilirubin: 0.7 mg/dL (ref 0.3–1.2)
Total Protein: 7.2 g/dL (ref 6.5–8.1)

## 2021-11-25 MED ORDER — SODIUM CHLORIDE 0.9% FLUSH
10.0000 mL | Freq: Once | INTRAVENOUS | Status: AC
  Administered 2021-11-25: 10 mL via INTRAVENOUS

## 2021-11-25 MED ORDER — HEPARIN SOD (PORK) LOCK FLUSH 100 UNIT/ML IV SOLN
500.0000 [IU] | Freq: Once | INTRAVENOUS | Status: AC
  Administered 2021-11-25: 500 [IU] via INTRAVENOUS

## 2021-11-25 MED ORDER — PANTOPRAZOLE SODIUM 40 MG PO TBEC
40.0000 mg | DELAYED_RELEASE_TABLET | Freq: Every day | ORAL | 2 refills | Status: DC
  Filled 2021-11-25: qty 30, 30d supply, fill #0
  Filled 2021-12-30: qty 30, 30d supply, fill #1
  Filled 2022-01-22: qty 30, 30d supply, fill #2

## 2021-11-25 MED ORDER — ONDANSETRON HCL 8 MG PO TABS
8.0000 mg | ORAL_TABLET | Freq: Three times a day (TID) | ORAL | 2 refills | Status: DC | PRN
  Filled 2021-11-25: qty 30, 10d supply, fill #0
  Filled 2022-02-13: qty 30, 10d supply, fill #1
  Filled 2022-05-11: qty 30, 10d supply, fill #2

## 2021-11-25 NOTE — Progress Notes (Signed)
Eric [name] OFFICE PROGRESS NOTE   Diagnosis: Colon cancer  INTERVAL HISTORY:   Eric [name] returns as scheduled.  He continues Xeloda.  He denies nausea/vomiting.  No mouth sores.  No diarrhea.  He notes hands are dry.  No significant pain.  He is applying lotion liberally.  He tripped over an object in the floor this morning.  He notes soreness of the left foot.  Objective:  Vital signs in last 24 hours:  Blood pressure (!) 130/100, pulse 96, temperature 97.9 F (36.6 C), temperature source Oral, resp. rate 20, height $RemoveBe'6\' 3"'OiysbXaUQ$  (1.905 m), weight 162 lb 9.6 oz (73.8 kg), SpO2 96 %.    HEENT: No thrush or ulcers. Resp: Lungs clear bilaterally. Cardio: Regular rate and rhythm. GI: Abdomen soft and nontender.  No hepatomegaly. Vascular: No leg edema. Skin: Palms and soles with mild erythema, skin thickening.  Linear scabbed ulceration left palm. Ward cath without erythema.  Lab Results:  Lab Results  Component Value Date   WBC 5.7 11/25/2021   HGB 10.2 (L) 11/25/2021   HCT 30.9 (L) 11/25/2021   MCV 97.8 11/25/2021   PLT 155 11/25/2021   NEUTROABS 3.3 11/25/2021    Imaging:  No results found.  Medications: I have reviewed the patient's current medications.  Assessment/Plan: Stage IV adenocarcinoma of the colon -11/15/2020 CT abdomen/pelvis with contrast-long segment masslike mural thickening of the cecum, posterior perforation at the site of the cecal wall thickening with multilocular right retroperitoneal abscess, indeterminate hypodensity in the inferior right lobe of the liver. -11/19/2020 CEA 0.8 -11/20/2020 MRI of the abdomen and pelvis with and without contrast-3.5 x 2.5 cm heterogeneous enhancing lesion in the inferior peripheral right liver with 2 smaller lesions with similar features. -11/20/2020 ultrasound-guided liver biopsy consistent with adenocarcinoma.  Foundation 1-MSS, tumor mutation burden 8,KRAS wt -11/25/2020 CT abdomen/pelvis with contrast-interval  placement of a right lower quadrant percutaneous pigtail drainage catheter with near resolution of the retroperitoneal fluid collection, redemonstrated large fungating mass at the cecal base measuring 11.9 x 9.1 x 8.4 cm with enlarged lymph nodes in the right lower quadrant mesocolon. -11/28/2020 exploratory laparotomy, right colectomy, ileostomy creation, cystoscopy with stent placement             -Surgical pathology showed adenocarcinoma, moderately differentiated, 12 cm, 0/14+ lymph nodes, lymphovascular space invasion present, radial margin involved by invasive adenocarcinoma, appendix with intraluminal             adenocarcinoma, pT4, PN 0 -12/09/2020 CT abdomen/pelvis with contrast-interval right colectomy and right lower quadrant ileostomy, stable right retroperitoneal soft tissue density along the psoas and iliacus muscles, mild progression of liver metastases since prior study. -Cycle 1 FOLFOX 12/24/2020 -Cycle 2 FOLFOX 01/06/2021 -Cycle 3 FOLFOX 01/20/2021 -Cycle 4 FOLFOX 02/03/2021 -Cycle 5 FOLFOX 02/17/2021 -Cycle 6 FOLFOX 03/03/2021 -Restaging CTs 03/05/2021- substantial interval decrease in size of predominantly hypodense lesions throughout the liver.  Significant interval resolution of previously seen extensive, heterogeneous soft tissue in the right retroperitoneal soft tissues and paracolic gutter overlying the psoas and iliacus muscles. -Cycle 7 FOLFOX 03/19/2021 -Cycle 8 FOLFOX 04/03/2021, oxaliplatin dose reduced secondary to neutropenia and thrombocytopenia -Cycle 9 FOLFOX 04/15/2021 -Cycle 10 FOLFOX 04/29/2021 -Cycle 11 FOLFOX 05/13/2021, oxaliplatin held secondary to neuropathy -Cycle 12 FOLFOX 05/27/2021, oxaliplatin held secondary to neuropathy -CT abdomen/pelvis 06/09/2021-decrease size of remaining dominant liver lesion, other liver lesions are calcified and punctate, subtle density in the posterior right bladder, unchanged sclerotic right iliac lesion -Cycle 13 FOLFOX 06/10/2021,  oxaliplatin held  secondary to neuropathy -Maintenance Xeloda 06/25/2021 -MRI liver 07/03/2021-no significant change in subtle irregular nonenhancing remnant liver lesions of the inferior right lobe of the liver measuring 1.2 x 0.6 cm and adjacent to the gallbladder fossa measuring no greater than 0.5 cm. -Maintenance Xeloda continued -Tumor conference 08/06/2021-rescan at a 93-month interval -Xeloda decreased to 1000 mg a.m., 500 mg p.m. 08/05/2021 (due to hand-foot syndrome) -MRI liver 10/31/2021-treated liver lesion within the inferior right hepatic lobe measuring 0.9 cm.  Small treated lesion within the posterior medial right hepatic lobe measuring 5 mm, unchanged. -Maintenance Xeloda continued   2.  Sepsis secondary to perforated colon 3.  Retroperitoneal abscess 4.  Protein calorie malnutrition 5.  Thrombocytosis 6.  Iron deficiency anemia 7.  COVID-19 infection, 11/15/2020 8.  Tobacco abuse 9.  Family history of colon cancer 10.  Port-A-Cath placement, Dr. 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  Admission 09/22/2021 with dehydration and probable COPD flare    Disposition: Eric [name] appears stable.  He continues maintenance Xeloda.  His case was presented at a recent GI tumor conference.  He may be a candidate for resection of liver lesions.  Referral made to Dr. Michaelle Birks.  CBC and chemistry panel from today reviewed.  Labs adequate to continue with Xeloda.  Hands and feet with changes of hand-foot syndrome.  He understands to stop Xeloda and contact the office if he develops skin breakdown or pain.  He will return for lab and follow-up in 3 weeks.    Ned Card ANP/GNP-BC   11/25/2021  1:26 PM

## 2021-11-25 NOTE — Progress Notes (Signed)
Referral placed to Social work

## 2021-11-25 NOTE — Patient Instructions (Signed)

## 2021-11-26 ENCOUNTER — Encounter: Payer: Self-pay | Admitting: *Deleted

## 2021-11-26 ENCOUNTER — Encounter: Payer: Self-pay | Admitting: Licensed Clinical Social Worker

## 2021-11-26 NOTE — Progress Notes (Signed)
[facility] Work  Clinical Social Work was referred by Engineer, site for assessment of psychosocial needs.  Clinical Social Worker contacted patient by phone  to offer support and assess for needs.  CSW left voicemail with contact information and request for return call.   Adelene Amas, [place]       First Attempt

## 2021-11-26 NOTE — Progress Notes (Signed)
Faxed referral order, demographics and chart information to [facility] for Dr. Michaelle Birks [phone]

## 2021-12-02 ENCOUNTER — Encounter: Payer: Self-pay | Admitting: Licensed Clinical Social Worker

## 2021-12-02 NOTE — Progress Notes (Signed)
Eric [name]  Clinical Social [name] was referred by Engineer, site for assessment of psychosocial needs.  Clinical Social Worker contacted patient by phone  to offer support and assess for needs. CSW left voicemail with contact information and request for return call.   Eric [name], [place]       Second Attempt

## 2021-12-09 ENCOUNTER — Encounter: Payer: Self-pay | Admitting: Licensed Clinical Social Worker

## 2021-12-09 NOTE — Progress Notes (Signed)
[facility]  Initial Assessment   Eric [name] is a 51 y.o. year old male contacted by phone. Clinical Social Work was referred by nurse navigator for assessment of psychosocial needs.   SDOH (Social Determinants of Health) assessments performed: Yes SDOH Interventions    Flowsheet Row Most Recent Value  SDOH Interventions   Intimate Partner Violence Interventions Intervention Not Indicated  Physical Activity Interventions Other (Comments)  [Patient states he walkes regularly but it is not brisk walking]  Social Connections Interventions Other (Comment)  [Patient regularly interacts with his neighbors and family members.  Patient's brother and nephew will be moving in with the patient.]       Distress Screen completed: No ONCBCN DISTRESS SCREENING 12/18/2020  Screening Type Initial Screening  Distress experienced in past week (1-10) 6  Practical problem type Insurance  Emotional problem type Nervousness/Anxiety;Adjusting to illness;Adjusting to appearance changes  Information Concerns Type Lack of info about diagnosis;Lack of info about treatment  Physical Problem type Pain;Nausea/vomiting;Sleep/insomnia;Bathing/dressing;Tingling hands/feet  Physician notified of physical symptoms Yes  Referral to clinical social work Yes  Referral to dietition Yes  Referral to financial advocate No  Referral to support programs No  Referral to palliative care No      Family/Social Information:  Housing Arrangement: patient lives alone but brother and nephew ar moving in with him. Family members/support persons in your life? Family, Friends/Colleagues, and [employer] concerns: no, sister, neighbor and brother provide transportation. Employment: Employed. Income source: Employment Financial concerns: No Type of concern: None Food access concerns: no Religious or spiritual practice: N/A Services Currently in place:  N/A  Coping/ Adjustment to diagnosis: Patient  understands treatment plan and what happens next? yes Concerns about diagnosis and/or treatment: I'm not especially worried about anything Patient reported stressors:  N/A Hopes and priorities: Patient stated he is feeling really well and is independent with all ADLs. Patient enjoys time with family/ friends Current coping skills/ strengths: Ability for insight , Active sense of humor , Capable of independent living , Financial means , and Supportive family/friends     SUMMARY: Current SDOH Barriers:  Patient does not have current barriers.  Clinical Social Work Clinical Goal(s):  Patient stated he is feeling really well and is independent with all ADLs and socializes with his patients and sees siblings regularly.   Interventions: Discussed common feeling and emotions when being diagnosed with cancer, and the importance of support during treatment Informed patient of the support team roles and support services at [facility] Provided CSW contact information and encouraged patient to call with any questions or concerns Provided patient with information about CSW role in patient care and available resources.    Follow Up Plan: Patient will contact CSW with any support or resource needs Patient verbalizes understanding of plan: Yes    Brielyn Bosak, LCSW

## 2021-12-10 ENCOUNTER — Encounter: Payer: Self-pay | Admitting: Gastroenterology

## 2021-12-11 ENCOUNTER — Other Ambulatory Visit: Payer: Self-pay | Admitting: Oncology

## 2021-12-11 ENCOUNTER — Other Ambulatory Visit: Payer: Self-pay | Admitting: Nurse Practitioner

## 2021-12-11 DIAGNOSIS — C189 Malignant neoplasm of colon, unspecified: Secondary | ICD-10-CM

## 2021-12-11 DIAGNOSIS — C787 Secondary malignant neoplasm of liver and intrahepatic bile duct: Secondary | ICD-10-CM

## 2021-12-12 ENCOUNTER — Other Ambulatory Visit: Payer: Self-pay | Admitting: Nurse Practitioner

## 2021-12-12 ENCOUNTER — Other Ambulatory Visit (HOSPITAL_COMMUNITY): Payer: Self-pay

## 2021-12-12 DIAGNOSIS — C189 Malignant neoplasm of colon, unspecified: Secondary | ICD-10-CM

## 2021-12-12 MED ORDER — DIPHENOXYLATE-ATROPINE 2.5-0.025 MG PO TABS
1.0000 | ORAL_TABLET | Freq: Two times a day (BID) | ORAL | 0 refills | Status: DC | PRN
  Filled 2021-12-12: qty 30, 15d supply, fill #0

## 2021-12-12 MED ORDER — OXYCODONE HCL 10 MG PO TABS
5.0000 mg | ORAL_TABLET | Freq: Four times a day (QID) | ORAL | 0 refills | Status: DC | PRN
  Filled 2021-12-12: qty 25, 13d supply, fill #0

## 2021-12-12 MED ORDER — PROCHLORPERAZINE MALEATE 10 MG PO TABS
10.0000 mg | ORAL_TABLET | Freq: Four times a day (QID) | ORAL | 1 refills | Status: DC | PRN
  Filled 2021-12-12: qty 60, 15d supply, fill #0
  Filled 2022-02-13: qty 60, 15d supply, fill #1

## 2021-12-15 ENCOUNTER — Other Ambulatory Visit (HOSPITAL_COMMUNITY): Payer: Self-pay

## 2021-12-16 ENCOUNTER — Inpatient Hospital Stay: Payer: 59 | Attending: Oncology

## 2021-12-16 ENCOUNTER — Other Ambulatory Visit: Payer: Self-pay | Admitting: Oncology

## 2021-12-16 ENCOUNTER — Inpatient Hospital Stay: Payer: 59

## 2021-12-16 ENCOUNTER — Inpatient Hospital Stay (HOSPITAL_BASED_OUTPATIENT_CLINIC_OR_DEPARTMENT_OTHER): Payer: 59 | Admitting: Oncology

## 2021-12-16 ENCOUNTER — Other Ambulatory Visit (HOSPITAL_COMMUNITY): Payer: Self-pay

## 2021-12-16 ENCOUNTER — Encounter: Payer: Self-pay | Admitting: Oncology

## 2021-12-16 ENCOUNTER — Other Ambulatory Visit: Payer: Self-pay

## 2021-12-16 VITALS — BP 130/90 | HR 89 | Temp 98.1°F | Resp 20 | Ht 75.0 in | Wt 162.2 lb

## 2021-12-16 DIAGNOSIS — C787 Secondary malignant neoplasm of liver and intrahepatic bile duct: Secondary | ICD-10-CM | POA: Insufficient documentation

## 2021-12-16 DIAGNOSIS — Z932 Ileostomy status: Secondary | ICD-10-CM | POA: Diagnosis not present

## 2021-12-16 DIAGNOSIS — L271 Localized skin eruption due to drugs and medicaments taken internally: Secondary | ICD-10-CM | POA: Insufficient documentation

## 2021-12-16 DIAGNOSIS — G620 Drug-induced polyneuropathy: Secondary | ICD-10-CM | POA: Insufficient documentation

## 2021-12-16 DIAGNOSIS — C189 Malignant neoplasm of colon, unspecified: Secondary | ICD-10-CM

## 2021-12-16 DIAGNOSIS — C180 Malignant neoplasm of cecum: Secondary | ICD-10-CM | POA: Diagnosis not present

## 2021-12-16 DIAGNOSIS — T451X5A Adverse effect of antineoplastic and immunosuppressive drugs, initial encounter: Secondary | ICD-10-CM | POA: Insufficient documentation

## 2021-12-16 DIAGNOSIS — Z95828 Presence of other vascular implants and grafts: Secondary | ICD-10-CM

## 2021-12-16 DIAGNOSIS — Z800 Family history of malignant neoplasm of digestive organs: Secondary | ICD-10-CM | POA: Diagnosis not present

## 2021-12-16 LAB — CBC WITH DIFFERENTIAL (CANCER CENTER ONLY)
Abs Immature Granulocytes: 0.02 10*3/uL (ref 0.00–0.07)
Basophils Absolute: 0.1 10*3/uL (ref 0.0–0.1)
Basophils Relative: 1 %
Eosinophils Absolute: 0.3 10*3/uL (ref 0.0–0.5)
Eosinophils Relative: 5 %
HCT: 32.6 % — ABNORMAL LOW (ref 39.0–52.0)
Hemoglobin: 10.6 g/dL — ABNORMAL LOW (ref 13.0–17.0)
Immature Granulocytes: 0 %
Lymphocytes Relative: 20 %
Lymphs Abs: 1.1 10*3/uL (ref 0.7–4.0)
MCH: 31.4 pg (ref 26.0–34.0)
MCHC: 32.5 g/dL (ref 30.0–36.0)
MCV: 96.4 fL (ref 80.0–100.0)
Monocytes Absolute: 0.6 10*3/uL (ref 0.1–1.0)
Monocytes Relative: 11 %
Neutro Abs: 3.4 10*3/uL (ref 1.7–7.7)
Neutrophils Relative %: 63 %
Platelet Count: 155 10*3/uL (ref 150–400)
RBC: 3.38 MIL/uL — ABNORMAL LOW (ref 4.22–5.81)
RDW: 15.3 % (ref 11.5–15.5)
WBC Count: 5.4 10*3/uL (ref 4.0–10.5)
nRBC: 0 % (ref 0.0–0.2)

## 2021-12-16 LAB — CMP (CANCER CENTER ONLY)
ALT: 9 U/L (ref 0–44)
AST: 16 U/L (ref 15–41)
Albumin: 4.2 g/dL (ref 3.5–5.0)
Alkaline Phosphatase: 138 U/L — ABNORMAL HIGH (ref 38–126)
Anion gap: 9 (ref 5–15)
BUN: 10 mg/dL (ref 6–20)
CO2: 23 mmol/L (ref 22–32)
Calcium: 9.3 mg/dL (ref 8.9–10.3)
Chloride: 103 mmol/L (ref 98–111)
Creatinine: 1.01 mg/dL (ref 0.61–1.24)
GFR, Estimated: >60 mL/min (ref >60)
Glucose, Bld: 113 mg/dL — ABNORMAL HIGH (ref 70–99)
Potassium: 4 mmol/L (ref 3.5–5.1)
Sodium: 135 mmol/L (ref 135–145)
Total Bilirubin: 0.6 mg/dL (ref 0.3–1.2)
Total Protein: 7.3 g/dL (ref 6.5–8.1)

## 2021-12-16 MED ORDER — SODIUM CHLORIDE 0.9% FLUSH
10.0000 mL | Freq: Once | INTRAVENOUS | Status: AC
  Administered 2021-12-16: 10 mL via INTRAVENOUS

## 2021-12-16 MED ORDER — HEPARIN SOD (PORK) LOCK FLUSH 100 UNIT/ML IV SOLN
500.0000 [IU] | Freq: Once | INTRAVENOUS | Status: AC
  Administered 2021-12-16: 500 [IU] via INTRAVENOUS

## 2021-12-16 MED ORDER — CAPECITABINE 500 MG PO TABS
ORAL_TABLET | ORAL | 0 refills | Status: DC
  Filled 2021-12-19: qty 90, 30d supply, fill #0

## 2021-12-16 NOTE — Progress Notes (Signed)
?[facility] ?OFFICE PROGRESS NOTE ? ? ?Diagnosis: Colon cancer ? ?INTERVAL HISTORY:  ? ?Eric [name] returns as scheduled.  He saw Dr. [name] 12/02/2021.  She plans to resect the remaining liver lesions and reverse the ileostomy.  He is scheduled for a GI evaluation later this week. ?He continue Xeloda.  No mouth sores or diarrhea.  He reports stable skin changes at the hands and feet.  He had an episode of nausea and vomiting on 12/12/2021. ? ? ?Objective: ? ?Vital signs in last 24 hours: ? ?Blood pressure 130/90, pulse 89, temperature 98.1 ?F (36.7 ?C), temperature source Oral, resp. rate 20, height $RemoveBe'6\' 3"'kGkEDchey$  (1.905 m), weight 162 lb 3.2 oz (73.6 kg), SpO2 100 %. ?  ? ?HEENT: No thrush or ulcers ?Resp: Distant breath sounds, no respiratory distress ?Cardio: Regular rate and rhythm ?GI: No hepatosplenomegaly, right abdomen ileostomy ?Vascular: No leg edema  ?Skin: Skin thickening, hyperpigmentation, and superficial cracks at the palms and soles ? ?Portacath/PICC-without erythema ? ?Lab Results: ? ?Lab Results  ?Component Value Date  ? WBC 5.4 12/16/2021  ? HGB 10.6 (L) 12/16/2021  ? HCT 32.6 (L) 12/16/2021  ? MCV 96.4 12/16/2021  ? PLT 155 12/16/2021  ? NEUTROABS 3.4 12/16/2021  ? ? ?CMP  ?Lab Results  ?Component Value Date  ? NA 137 11/25/2021  ? K 3.5 11/25/2021  ? CL 104 11/25/2021  ? CO2 25 11/25/2021  ? GLUCOSE 106 (H) 11/25/2021  ? BUN 15 11/25/2021  ? CREATININE 0.95 11/25/2021  ? CALCIUM 9.2 11/25/2021  ? PROT 7.2 11/25/2021  ? ALBUMIN 4.2 11/25/2021  ? AST 17 11/25/2021  ? ALT 13 11/25/2021  ? ALKPHOS 130 (H) 11/25/2021  ? BILITOT 0.7 11/25/2021  ? GFRNONAA >60 11/25/2021  ? ? ?Lab Results  ?Component Value Date  ? CEA1 0.8 11/19/2020  ? CEA 2.47 06/09/2021  ? ? ?Medications: I have reviewed the patient's current medications. ? ? ?Assessment/Plan: ?Stage IV adenocarcinoma of the colon ?-11/15/2020 CT abdomen/pelvis with contrast-long segment masslike mural thickening of the cecum, posterior perforation  at the site of the cecal wall thickening with multilocular right retroperitoneal abscess, indeterminate hypodensity in the inferior right lobe of the liver. ?-11/19/2020 CEA 0.8 ?-11/20/2020 MRI of the abdomen and pelvis with and without contrast-3.5 x 2.5 cm heterogeneous enhancing lesion in the inferior peripheral right liver with 2 smaller lesions with similar features. ?-11/20/2020 ultrasound-guided liver biopsy consistent with adenocarcinoma.  Foundation 1-MSS, tumor mutation burden 8,KRAS wt ?-11/25/2020 CT abdomen/pelvis with contrast-interval placement of a right lower quadrant percutaneous pigtail drainage catheter with near resolution of the retroperitoneal fluid collection, redemonstrated large fungating mass at the cecal base measuring 11.9 x 9.1 x 8.4 cm with enlarged lymph nodes in the right lower quadrant mesocolon. ?-11/28/2020 exploratory laparotomy, right colectomy, ileostomy creation, cystoscopy with stent placement ?            -Surgical pathology showed adenocarcinoma, moderately differentiated, 12 cm, 0/14+ lymph nodes, lymphovascular space invasion present, radial margin involved by invasive adenocarcinoma, appendix with intraluminal             adenocarcinoma, pT4, PN 0 ?-12/09/2020 CT abdomen/pelvis with contrast-interval right colectomy and right lower quadrant ileostomy, stable right retroperitoneal soft tissue density along the psoas and iliacus muscles, mild progression of liver metastases since prior study. ?-Cycle 1 FOLFOX 12/24/2020 ?-Cycle 2 FOLFOX 01/06/2021 ?-Cycle 3 FOLFOX 01/20/2021 ?-Cycle 4 FOLFOX 02/03/2021 ?-Cycle 5 FOLFOX 02/17/2021 ?-Cycle 6 FOLFOX 03/03/2021 ?-Restaging CTs 03/05/2021- substantial interval decrease in  size of predominantly hypodense lesions throughout the liver.  Significant interval resolution of previously seen extensive, heterogeneous soft tissue in the right retroperitoneal soft tissues and paracolic gutter overlying the psoas and iliacus muscles. ?-Cycle 7 FOLFOX  03/19/2021 ?-Cycle 8 FOLFOX 04/03/2021, oxaliplatin dose reduced secondary to neutropenia and thrombocytopenia ?-Cycle 9 FOLFOX 04/15/2021 ?-Cycle 10 FOLFOX 04/29/2021 ?-Cycle 11 FOLFOX 05/13/2021, oxaliplatin held secondary to neuropathy ?-Cycle 12 FOLFOX 05/27/2021, oxaliplatin held secondary to neuropathy ?-CT abdomen/pelvis 06/09/2021-decrease size of remaining dominant liver lesion, other liver lesions are calcified and punctate, subtle density in the posterior right bladder, unchanged sclerotic right iliac lesion ?-Cycle 13 FOLFOX 06/10/2021, oxaliplatin held secondary to neuropathy ?-Maintenance Xeloda 06/25/2021 ?-MRI liver 07/03/2021-no significant change in subtle irregular nonenhancing remnant liver lesions of the inferior right lobe of the liver measuring 1.2 x 0.6 cm and adjacent to the gallbladder fossa measuring no greater than 0.5 cm. ?-Maintenance Xeloda continued ?-Tumor conference 08/06/2021-rescan at a 83-monthinterval ?-Xeloda decreased to 1000 mg a.m., 500 mg p.m. 08/05/2021 (due to hand-foot syndrome) ?-MRI liver 10/31/2021-treated liver lesion within the inferior right hepatic lobe measuring 0.9 cm.  Small treated lesion within the posterior medial right hepatic lobe measuring 5 mm, unchanged. ?-Maintenance Xeloda continued ?  ?2.  Sepsis secondary to perforated colon ?3.  Retroperitoneal abscess ?4.  Protein calorie malnutrition ?5.  Thrombocytosis ?6.  Iron deficiency anemia ?7.  COVID-19 infection, 11/15/2020 ?8.  Tobacco abuse ?9.  Family history of colon cancer ?10.  Port-A-Cath placement, Dr. 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  Admission 09/22/2021 with dehydration and probable COPD flare ?  ? ? ?Disposition: ?[name] appears stable.  He will continue Xeloda maintenance.  There is no clinical evidence of disease progression.  He saw Dr. AZenia Residesand is undergoing a preoperative evaluation with the plan to perform  wedge resection of remaining liver lesions and reversal of the ileostomy. ? ?Mr. Shibley will return for an office visit in 3 weeks.  He will discontinue Xeloda for 2 weeks prior to surgery. ? ?GBetsy Coder MD ? ?12/16/2021  ?8:17 AM ? ? ?

## 2021-12-16 NOTE — Patient Instructions (Signed)

## 2021-12-19 ENCOUNTER — Other Ambulatory Visit (HOSPITAL_COMMUNITY): Payer: Self-pay

## 2021-12-19 ENCOUNTER — Ambulatory Visit (INDEPENDENT_AMBULATORY_CARE_PROVIDER_SITE_OTHER): Payer: 59 | Admitting: Gastroenterology

## 2021-12-19 ENCOUNTER — Encounter: Payer: Self-pay | Admitting: Gastroenterology

## 2021-12-19 VITALS — BP 118/84 | HR 98 | Ht 75.0 in | Wt 165.0 lb

## 2021-12-19 DIAGNOSIS — C787 Secondary malignant neoplasm of liver and intrahepatic bile duct: Secondary | ICD-10-CM

## 2021-12-19 DIAGNOSIS — K631 Perforation of intestine (nontraumatic): Secondary | ICD-10-CM

## 2021-12-19 DIAGNOSIS — C189 Malignant neoplasm of colon, unspecified: Secondary | ICD-10-CM | POA: Diagnosis not present

## 2021-12-19 NOTE — Progress Notes (Signed)
Agree with assessment as outlined. ?From operative reports it appears he has a diverting ileostomy? If that is the case, won't be able to prep his colon for a colonoscopy. We have reached out to the surgical team to clarify his anatomy to determine if his colon can be prepped in its current state or not. If not, will need to do colonoscopy after ileostomy takedown.  ?

## 2021-12-19 NOTE — Patient Instructions (Signed)
Will call once we speak to Dr. [name].  ? ?If you are age 53 or younger, your body mass index should be between 19-25. Your Body mass index is 20.62 kg/m?Marland Kitchen If this is out of the aformentioned range listed, please consider follow up with your Primary Care Provider.  ? ?________________________________________________________ ? ?The [facility] providers would like to encourage you to use [facility] to communicate with providers for non-urgent requests or questions.  Due to long hold times on the telephone, sending your provider a message by [facility] may be a faster and more efficient way to get a response.  Please allow 48 business hours for a response.  Please remember that this is for non-urgent requests.  ?_______________________________________________________ ? ?

## 2021-12-19 NOTE — Progress Notes (Signed)
? ? ? ?12/19/2021 ?Garnette Scheuermann ?[id number] ?[birth year] ? ? ?HISTORY OF PRESENT ILLNESS:  This is a 51 year old male who was diagnosed with colon cancer of the cecum with liver metastasis who presented with abdominal pain and perforation into the retroperitoneum s/p laparotomy with right hemicolectomy and ileostomy in 11/2020.  Sent here today by Dr. Michaelle Birks for consideration of colonoscopy to assess remainder of the colon for synchronous lesions prior to ostomy reversal.  He was treated with FOLFOX for 13 cycles.  He began maintenance Xeloda on 06/25/2021.  He denies any issues.  Ostomy is functioning well.  No blood.  Has never had a colonoscopy.  Has family history of colon cancer in a distant relative, no immediate family members. ? ? ?Past Medical History:  ?Diagnosis Date  ? COVID-19 virus infection 11/15/2020  ? ?Past Surgical History:  ?Procedure Laterality Date  ? CYSTOSCOPY WITH STENT PLACEMENT N/A 11/28/2020  ? Procedure: CYSTOSCOPY WITH STENT PLACEMENT;  Surgeon: Janith Lima, MD;  Location: [place];  Service: Urology;  Laterality: N/A;  ? IR RADIOLOGIST EVAL & MGMT  07/22/2021  ? LAPAROTOMY N/A 11/28/2020  ? Procedure: EXPLORATORY LAPAROTOMY;  Surgeon: Ralene Ok, MD;  Location: [place];  Service: General;  Laterality: N/A;  DOW CASE TO START AT 730AM 4 HOUR CASE  ? OSTOMY N/A 11/28/2020  ? Procedure: ILEOSTOMY CREATION;  Surgeon: Ralene Ok, MD;  Location: [place];  Service: General;  Laterality: N/A;  ? PARTIAL COLECTOMY Right 11/28/2020  ? Procedure: RIGHT COLECTOMY;  Surgeon: Ralene Ok, MD;  Location: [place];  Service: General;  Laterality: Right;  ? PORTACATH PLACEMENT Right 12/12/2020  ? Procedure: INSERTION PORT-A-CATH;  Surgeon: Dwan Bolt, MD;  Location: [place];  Service: General;  Laterality: Right;  ? ? reports that he has been smoking cigarettes. He has a 15.00 pack-year smoking history. He has never used smokeless tobacco. He reports that he does not currently use alcohol. He  reports current drug use. Drug: Marijuana. ?family history includes Colon cancer in an other family member; Diabetes in his sister. ?No Known Allergies ? ?  ?Outpatient Encounter Medications as of 12/19/2021  ?Medication Sig  ? albuterol (VENTOLIN HFA) 108 (90 Base) MCG/ACT inhaler Inhale 2 puffs into the lungs every 6 (six) hours as needed for wheezing or shortness of breath.  ? capecitabine (XELODA) 500 MG tablet Take 2 tablets every morning and 1 tablet every evening  ? diazepam (VALIUM) 5 MG tablet Take 1/2 tablet 30 to 60 minutes prior to MRI. May repeat x 1  ? diazepam (VALIUM) 5 MG tablet Take 1/2 tablet by mouth 30 - 60 minutes  prior to MRI --bring other 1/2 tablet to appointment.  ? diphenoxylate-atropine (LOMOTIL) 2.5-0.025 MG tablet Take 1 tablet by mouth 2  times daily as needed (increased ostomy output).  ? fluticasone furoate-vilanterol (BREO ELLIPTA) 100-25 MCG/ACT AEPB Inhale 1 puff into the lungs daily.  ? gabapentin (NEURONTIN) 300 MG capsule TAKE 1 CAPSULE BY MOUTH 3 TIMES A DAY  ? lidocaine-prilocaine (EMLA) cream Apply to port site 1-2 hours prior to stick and cover with plastic wrap to numb site as directed.  ? ondansetron (ZOFRAN) 8 MG tablet Take 1 tablet (8 mg total) by mouth every 8 (eight) hours as needed for vomiting or nausea.  ? Oxycodone HCl 10 MG TABS Take 1/2 tablet (5 mg total) by mouth every 6 hours as needed.  ? pantoprazole (PROTONIX) 40 MG tablet Take 1 tablet by mouth  daily.  ? polycarbophil (FIBERCON) 625 MG tablet Take 1 tablet (625 mg total) by mouth daily.  ? prochlorperazine (COMPAZINE) 10 MG tablet Take 1 tablet (10 mg total) by mouth every 6 (six) hours as needed for nausea.  ? [DISCONTINUED] feeding supplement (ENSURE ENLIVE / ENSURE PLUS) LIQD Take 237 mLs by mouth 3 (three) times daily between meals. (Patient not taking: Reported on 12/19/2021)  ? ?No facility-administered encounter medications on file as of 12/19/2021.  ? ? ? ?REVIEW OF SYSTEMS  : All other systems  reviewed and negative except where noted in the History of Present Illness. ? ? ?PHYSICAL EXAM: ?BP 118/84   Pulse 98   Ht '6\' 3"'$  (1.905 m)   Wt 165 lb (74.8 kg)   SpO2 97%   BMI 20.62 kg/m?  ?General: Well developed white male in no acute distress ?Head: Normocephalic and atraumatic ?Eyes:  Sclerae anicteric, conjunctiva pink. ?Ears: Normal auditory acuity ?Lungs: Clear throughout to auscultation; no W/R/R. ?Heart: Regular rate and rhythm; no M/R/G. ?Abdomen: Soft, non-distended.  BS present.  Non-tender. RUQ ileostomy noted. ?Musculoskeletal: Symmetrical with no gross deformities  ?Skin: No lesions on visible extremities ?Extremities: No edema  ?Neurological: Alert oriented x 4, grossly non-focal ?Psychological:  Alert and cooperative. Normal mood and affect ? ?ASSESSMENT AND PLAN: ?*Colon cancer of the cecum with liver metastasis who presented with abdominal pain and perforation into the retroperitoneum s/p laparotomy with right hemicolectomy and ileostomy in 11/2020.  Sent here for consideration of colonoscopy to assess remainder of the colon for synchronous lesions prior to ostomy reversal.  I discussed with Dr. Havery Moros.  Not sure this is an option as we do not have a way to prep his colon.  Will await response from message sent to Dr. [name]. ? ? ?CC:  [facility], [place]* ?CC:  Dr. Michaelle Birks ? ?  ?

## 2021-12-24 ENCOUNTER — Telehealth: Payer: Self-pay

## 2021-12-24 DIAGNOSIS — C189 Malignant neoplasm of colon, unspecified: Secondary | ICD-10-CM

## 2021-12-24 DIAGNOSIS — K631 Perforation of intestine (nontraumatic): Secondary | ICD-10-CM

## 2021-12-24 NOTE — Telephone Encounter (Signed)
Lm on mobile vm for patient to return call °

## 2021-12-24 NOTE — Telephone Encounter (Signed)
-----   Message from Yetta Flock, MD sent at 12/24/2021  1:07 PM EDT ----- ?No problem, we will do our best with his anatomy and see what we can do. ? ?Eric [name] can you contact this patient and schedule him for colonoscopy in the [place]. He will need to use a few enemas before the procedure to help clear his colon of any residual mucous, etc, since he has a diverting ileostomy, there will be no bowel prep otherwise.  ? ?Thanks ?Eric [name] ? ?----- Message ----- ?From: Dwan Bolt, MD ?Sent: 12/24/2021  10:10 AM EDT ?To: Loralie Champagne, PA-C, Yetta Flock, MD ? ?Eric [name], ?If you would be willing to try and clear him for a scope I would really appreciate it. I would feel a lot better about reversing him if we could just make sure there are no obvious synchronous tumors, which could change my plan regarding resecting the liver lesions as well. Thanks for your help, I really appreciate it. ?Eric [name] ? ?----- Message ----- ?From: Yetta Flock, MD ?Sent: 12/22/2021   5:29 PM EDT ?To: Dwan Bolt, MD, Loralie Champagne, PA-C ? ?Thanks Eric [name], ? ?Not so concerned about residual stool in his colon but more so patients often have diversion colitis and significant amount of mucus in the colon which can be hard to clear.  We could try giving him a few enemas and clear as much as we can, unsure if we will be able to clear his entire residual colon but we can certainly try if that would help you. Thanks ? ?Eric [name] ?----- Message ----- ?From: Dwan Bolt, MD ?Sent: 12/22/2021   9:19 AM EDT ?To: Loralie Champagne, PA-C, Yetta Flock, MD ? ?Thank you for seeing him. He had a right colectomy a year ago for colon cancer, so he has an end ileostomy. He also has several liver metastases that need to be resected. I am hesitant to reverse his ileostomy without confirming that he doesn't have any other synchronous lesions in his colon, as it does not appear he ever had a full colonoscopy. I understand there is no way to prep  him, but he has been diverted for about a year now so I don't know how much remaining stool burden he would really have in his colon. ?Thanks, ?Eric [name] ? ?----- Message ----- ?From: Yetta Flock, MD ?Sent: 12/19/2021   4:58 PM EDT ?To: Dwan Bolt, MD, Loralie Champagne, PA-C ? ?Thanks Eric [name]. ?Yes, to clarify, looking at the operative notes, it looks like he has a diverting ileostomy? Just wanted to clarify that. If that is the case, we would not be able to prep his colon and would need to do his colonoscopy postoperatively. Can you clarify his ileostomy? Thanks ? ?Eric [name] ? ?----- Message ----- ?From: Loralie Champagne, PA-C ?Sent: 12/19/2021   1:10 PM EST ?To: Dwan Bolt, MD, Yetta Flock, MD ? ?Good morning.  I saw this patient in clinic today for consideration of colonoscopy prior to his ileostomy reversal.  I discussed with Dr. Havery Moros and due to ileostomy that is in place we do not have a way or option to prep his colon in order to proceed with colonoscopy.  He asked me to send you a message and to copy him on this so we can decide plan going forward. ? ?Thank you, ? ?Eric [name] ? ? ? ? ? ? ?

## 2021-12-25 ENCOUNTER — Other Ambulatory Visit (HOSPITAL_COMMUNITY): Payer: Self-pay

## 2021-12-26 NOTE — Telephone Encounter (Signed)
Lm on both home ([phone]) vm's for patient to return call. ?

## 2021-12-30 ENCOUNTER — Other Ambulatory Visit (HOSPITAL_COMMUNITY): Payer: Self-pay

## 2022-01-01 NOTE — Telephone Encounter (Signed)
Attempted to reach pt at [phone], a male answers and states that his grandmother takes care of the patient's appts and he will have her give [place] a call back. I provided them with the office number. ?

## 2022-01-02 NOTE — Telephone Encounter (Signed)
Cecille Rubin, pt's sister, returned call. I have reviewed recommendations for colonoscopy prior to pt's surgery with Dr. [name]. We have scheduled pt for a colonoscopy with Dr. Havery Moros in the [facility] on Monday, 02/02/22 at 8:30 am. Cecille Rubin is aware that pt will need to arrive at 7:30 am with a care partner. Cecille Rubin is aware that pt will not need a traditional prep but will need to complete a few enemas prior to his appt. Cecille Rubin reports that she has access to pt's my chart. She is aware that I will send patient's instructions to his my chart and I will place a copy in the mail. Cecille Rubin verbalized understanding and had no concerns at the end of the call. ? ?Ambulatory referral to GI in epic. Instructions sent to pt via my chart and mailed. ?

## 2022-01-02 NOTE — Telephone Encounter (Signed)
No return call received. My chart message sent to patient to contact the office. ?

## 2022-01-05 NOTE — Telephone Encounter (Signed)
[name], Eric Raspberry, MD  Yevette Edwards, RN ?How about 2 tap water enemas before he leaves his house for the procedure with [place]. Thanks  ?

## 2022-01-06 ENCOUNTER — Telehealth: Payer: Self-pay

## 2022-01-06 ENCOUNTER — Other Ambulatory Visit: Payer: Self-pay

## 2022-01-06 ENCOUNTER — Inpatient Hospital Stay: Payer: 59

## 2022-01-06 ENCOUNTER — Encounter: Payer: Self-pay | Admitting: Nurse Practitioner

## 2022-01-06 ENCOUNTER — Inpatient Hospital Stay (HOSPITAL_BASED_OUTPATIENT_CLINIC_OR_DEPARTMENT_OTHER): Payer: 59 | Admitting: Nurse Practitioner

## 2022-01-06 VITALS — BP 128/88 | HR 84 | Temp 98.0°F | Resp 18 | Ht 75.0 in | Wt 163.6 lb

## 2022-01-06 DIAGNOSIS — C189 Malignant neoplasm of colon, unspecified: Secondary | ICD-10-CM

## 2022-01-06 DIAGNOSIS — C180 Malignant neoplasm of cecum: Secondary | ICD-10-CM | POA: Diagnosis not present

## 2022-01-06 DIAGNOSIS — Z95828 Presence of other vascular implants and grafts: Secondary | ICD-10-CM

## 2022-01-06 DIAGNOSIS — C787 Secondary malignant neoplasm of liver and intrahepatic bile duct: Secondary | ICD-10-CM

## 2022-01-06 LAB — CBC WITH DIFFERENTIAL (CANCER CENTER ONLY)
Abs Immature Granulocytes: 0.02 10*3/uL (ref 0.00–0.07)
Basophils Absolute: 0 10*3/uL (ref 0.0–0.1)
Basophils Relative: 1 %
Eosinophils Absolute: 0.3 10*3/uL (ref 0.0–0.5)
Eosinophils Relative: 5 %
HCT: 31.5 % — ABNORMAL LOW (ref 39.0–52.0)
Hemoglobin: 10.4 g/dL — ABNORMAL LOW (ref 13.0–17.0)
Immature Granulocytes: 0 %
Lymphocytes Relative: 19 %
Lymphs Abs: 1.1 10*3/uL (ref 0.7–4.0)
MCH: 31.5 pg (ref 26.0–34.0)
MCHC: 33 g/dL (ref 30.0–36.0)
MCV: 95.5 fL (ref 80.0–100.0)
Monocytes Absolute: 0.6 10*3/uL (ref 0.1–1.0)
Monocytes Relative: 10 %
Neutro Abs: 3.7 10*3/uL (ref 1.7–7.7)
Neutrophils Relative %: 65 %
Platelet Count: 161 10*3/uL (ref 150–400)
RBC: 3.3 MIL/uL — ABNORMAL LOW (ref 4.22–5.81)
RDW: 15.5 % (ref 11.5–15.5)
WBC Count: 5.8 10*3/uL (ref 4.0–10.5)
nRBC: 0 % (ref 0.0–0.2)

## 2022-01-06 LAB — CMP (CANCER CENTER ONLY)
ALT: 12 U/L (ref 0–44)
AST: 18 U/L (ref 15–41)
Albumin: 4.2 g/dL (ref 3.5–5.0)
Alkaline Phosphatase: 128 U/L — ABNORMAL HIGH (ref 38–126)
Anion gap: 9 (ref 5–15)
BUN: 13 mg/dL (ref 6–20)
CO2: 23 mmol/L (ref 22–32)
Calcium: 9.5 mg/dL (ref 8.9–10.3)
Chloride: 104 mmol/L (ref 98–111)
Creatinine: 1.11 mg/dL (ref 0.61–1.24)
GFR, Estimated: >60 mL/min (ref >60)
Glucose, Bld: 109 mg/dL — ABNORMAL HIGH (ref 70–99)
Potassium: 3.8 mmol/L (ref 3.5–5.1)
Sodium: 136 mmol/L (ref 135–145)
Total Bilirubin: 0.6 mg/dL (ref 0.3–1.2)
Total Protein: 7.4 g/dL (ref 6.5–8.1)

## 2022-01-06 MED ORDER — HEPARIN SOD (PORK) LOCK FLUSH 100 UNIT/ML IV SOLN
500.0000 [IU] | Freq: Once | INTRAVENOUS | Status: AC
  Administered 2022-01-06: 500 [IU] via INTRAVENOUS

## 2022-01-06 MED ORDER — SODIUM CHLORIDE 0.9% FLUSH
10.0000 mL | Freq: Once | INTRAVENOUS | Status: AC
  Administered 2022-01-06: 10 mL via INTRAVENOUS

## 2022-01-06 NOTE — Progress Notes (Signed)
?[place] ?OFFICE PROGRESS NOTE ? ? ?Diagnosis: Colon cancer ? ?INTERVAL HISTORY:  ? ?Eric [name] returns as scheduled.  He continues maintenance Xeloda.  He had a single episode of nausea/vomiting since his last office visit.  He attributes this to dietary intake.  No mouth sores.  No diarrhea.  Hands remain dry, unchanged as compared to 3 weeks ago. ? ?Objective: ? ?Vital signs in last 24 hours: ? ?Blood pressure 128/88, pulse 84, temperature 98 ?F (36.7 ?C), temperature source Oral, resp. rate 18, height 6' 3" (1.905 m), weight 163 lb 9.6 oz (74.2 kg), SpO2 100 %. ?  ? ?HEENT: No thrush or ulcers. ?Resp: Distant breath sounds.  No respiratory distress. ?Cardio: Regular rate and rhythm. ?GI: Abdomen soft and nontender.  No hepatomegaly.  Right abdomen ileostomy. ?Vascular: No leg edema. ?Skin: Palms and soles with skin thickening, hyperpigmentation, dryness. ?Port-A-Cath without erythema ? ? ?Lab Results: ? ?Lab Results  ?Component Value Date  ? WBC 5.8 01/06/2022  ? HGB 10.4 (L) 01/06/2022  ? HCT 31.5 (L) 01/06/2022  ? MCV 95.5 01/06/2022  ? PLT 161 01/06/2022  ? NEUTROABS 3.7 01/06/2022  ? ? ?Imaging: ? ?No results found. ? ?Medications: I have reviewed the patient's current medications. ? ?Assessment/Plan: ?Stage IV adenocarcinoma of the colon ?-11/15/2020 CT abdomen/pelvis with contrast-long segment masslike mural thickening of the cecum, posterior perforation at the site of the cecal wall thickening with multilocular right retroperitoneal abscess, indeterminate hypodensity in the inferior right lobe of the liver. ?-11/19/2020 CEA 0.8 ?-11/20/2020 MRI of the abdomen and pelvis with and without contrast-3.5 x 2.5 cm heterogeneous enhancing lesion in the inferior peripheral right liver with 2 smaller lesions with similar features. ?-11/20/2020 ultrasound-guided liver biopsy consistent with adenocarcinoma.  Foundation 1-MSS, tumor mutation burden 8,KRAS wt ?-11/25/2020 CT abdomen/pelvis with  contrast-interval placement of a right lower quadrant percutaneous pigtail drainage catheter with near resolution of the retroperitoneal fluid collection, redemonstrated large fungating mass at the cecal base measuring 11.9 x 9.1 x 8.4 cm with enlarged lymph nodes in the right lower quadrant mesocolon. ?-11/28/2020 exploratory laparotomy, right colectomy, ileostomy creation, cystoscopy with stent placement ?            -Surgical pathology showed adenocarcinoma, moderately differentiated, 12 cm, 0/14+ lymph nodes, lymphovascular space invasion present, radial margin involved by invasive adenocarcinoma, appendix with intraluminal             adenocarcinoma, pT4, PN 0 ?-12/09/2020 CT abdomen/pelvis with contrast-interval right colectomy and right lower quadrant ileostomy, stable right retroperitoneal soft tissue density along the psoas and iliacus muscles, mild progression of liver metastases since prior study. ?-Cycle 1 FOLFOX 12/24/2020 ?-Cycle 2 FOLFOX 01/06/2021 ?-Cycle 3 FOLFOX 01/20/2021 ?-Cycle 4 FOLFOX 02/03/2021 ?-Cycle 5 FOLFOX 02/17/2021 ?-Cycle 6 FOLFOX 03/03/2021 ?-Restaging CTs 03/05/2021- substantial interval decrease in size of predominantly hypodense lesions throughout the liver.  Significant interval resolution of previously seen extensive, heterogeneous soft tissue in the right retroperitoneal soft tissues and paracolic gutter overlying the psoas and iliacus muscles. ?-Cycle 7 FOLFOX 03/19/2021 ?-Cycle 8 FOLFOX 04/03/2021, oxaliplatin dose reduced secondary to neutropenia and thrombocytopenia ?-Cycle 9 FOLFOX 04/15/2021 ?-Cycle 10 FOLFOX 04/29/2021 ?-Cycle 11 FOLFOX 05/13/2021, oxaliplatin held secondary to neuropathy ?-Cycle 12 FOLFOX 05/27/2021, oxaliplatin held secondary to neuropathy ?-CT abdomen/pelvis 06/09/2021-decrease size of remaining dominant liver lesion, other liver lesions are calcified and punctate, subtle density in the posterior right bladder, unchanged sclerotic right iliac lesion ?-Cycle 13 FOLFOX  06/10/2021, oxaliplatin held secondary to neuropathy ?-Maintenance Xeloda 06/25/2021 ?-  MRI liver 07/03/2021-no significant change in subtle irregular nonenhancing remnant liver lesions of the inferior right lobe of the liver measuring 1.2 x 0.6 cm and adjacent to the gallbladder fossa measuring no greater than 0.5 cm. ?-Maintenance Xeloda continued ?-Tumor conference 08/06/2021-rescan at a 91-monthinterval ?-Xeloda decreased to 1000 mg a.m., 500 mg p.m. 08/05/2021 (due to hand-foot syndrome) ?-MRI liver 10/31/2021-treated liver lesion within the inferior right hepatic lobe measuring 0.9 cm.  Small treated lesion within the posterior medial right hepatic lobe measuring 5 mm, unchanged. ?-Maintenance Xeloda continued ?  ?2.  Sepsis secondary to perforated colon ?3.  Retroperitoneal abscess ?4.  Protein calorie malnutrition ?5.  Thrombocytosis ?6.  Iron deficiency anemia ?7.  COVID-19 infection, 11/15/2020 ?8.  Tobacco abuse ?9.  Family history of colon cancer ?10.  Port-A-Cath placement, Dr. 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  Admission 09/22/2021 with dehydration and probable COPD flare ?  ? ?Disposition: Eric [name] stable.  He continues maintenance Xeloda.  There is no clinical evidence of disease progression.  Plan is to continue Xeloda until 2 weeks prior to surgery which has not yet been scheduled. ? ?He has stable changes of hand-foot syndrome.  He understands to contact the office if hand foot symptoms progress. ? ?CBC from today reviewed.  Counts adequate to continue Xeloda. ? ?He will return for lab/flush, follow-up in 3 weeks.  We are available to see him sooner if needed. ? ?LNed CardANP/GNP-BC  ? ?01/06/2022  ?10:45 AM ? ? ? ? ? ? ? ?

## 2022-01-06 NOTE — Telephone Encounter (Signed)
Called [facility] at [facility] and spoke with Mel Almond to have her to open the patient referral back up. She stated she will open the referral up and she was going to call Cecille Rubin the patient sister at [phone] to schedule his appointment.  ?

## 2022-01-15 ENCOUNTER — Other Ambulatory Visit (HOSPITAL_COMMUNITY): Payer: Self-pay

## 2022-01-16 ENCOUNTER — Other Ambulatory Visit (HOSPITAL_COMMUNITY): Payer: Self-pay

## 2022-01-16 ENCOUNTER — Other Ambulatory Visit: Payer: Self-pay | Admitting: Oncology

## 2022-01-16 DIAGNOSIS — C787 Secondary malignant neoplasm of liver and intrahepatic bile duct: Secondary | ICD-10-CM

## 2022-01-16 MED FILL — Capecitabine Tab 500 MG: ORAL | Qty: 90 | Fill #0 | Status: CN

## 2022-01-19 ENCOUNTER — Other Ambulatory Visit (HOSPITAL_COMMUNITY): Payer: Self-pay

## 2022-01-20 ENCOUNTER — Other Ambulatory Visit (HOSPITAL_COMMUNITY): Payer: Self-pay

## 2022-01-20 ENCOUNTER — Other Ambulatory Visit: Payer: Self-pay | Admitting: Oncology

## 2022-01-20 DIAGNOSIS — C787 Secondary malignant neoplasm of liver and intrahepatic bile duct: Secondary | ICD-10-CM

## 2022-01-20 MED FILL — Capecitabine Tab 500 MG: ORAL | 30 days supply | Qty: 90 | Fill #0 | Status: AC

## 2022-01-21 ENCOUNTER — Other Ambulatory Visit (HOSPITAL_COMMUNITY): Payer: Self-pay

## 2022-01-22 ENCOUNTER — Other Ambulatory Visit: Payer: Self-pay | Admitting: Nurse Practitioner

## 2022-01-22 DIAGNOSIS — C189 Malignant neoplasm of colon, unspecified: Secondary | ICD-10-CM

## 2022-01-23 ENCOUNTER — Other Ambulatory Visit (HOSPITAL_COMMUNITY): Payer: Self-pay

## 2022-01-23 ENCOUNTER — Other Ambulatory Visit: Payer: Self-pay | Admitting: Oncology

## 2022-01-23 DIAGNOSIS — C189 Malignant neoplasm of colon, unspecified: Secondary | ICD-10-CM

## 2022-01-23 MED ORDER — OXYCODONE HCL 10 MG PO TABS
5.0000 mg | ORAL_TABLET | Freq: Four times a day (QID) | ORAL | 0 refills | Status: DC | PRN
  Filled 2022-01-23: qty 25, 12d supply, fill #0

## 2022-01-23 MED ORDER — DIPHENOXYLATE-ATROPINE 2.5-0.025 MG PO TABS
ORAL_TABLET | ORAL | 1 refills | Status: DC
  Filled 2022-01-23: qty 30, 15d supply, fill #0

## 2022-01-23 MED ORDER — DIPHENOXYLATE-ATROPINE 2.5-0.025 MG PO TABS: 1.0000 | ORAL_TABLET | Freq: Two times a day (BID) | ORAL | 0 refills | Status: DC | PRN

## 2022-01-25 ENCOUNTER — Encounter: Payer: Self-pay | Admitting: Certified Registered Nurse Anesthetist

## 2022-01-26 ENCOUNTER — Other Ambulatory Visit (HOSPITAL_COMMUNITY): Payer: Self-pay

## 2022-01-27 ENCOUNTER — Inpatient Hospital Stay: Payer: 59 | Attending: Oncology

## 2022-01-27 ENCOUNTER — Other Ambulatory Visit (HOSPITAL_COMMUNITY): Payer: Self-pay

## 2022-01-27 ENCOUNTER — Inpatient Hospital Stay: Payer: 59 | Admitting: Nurse Practitioner

## 2022-01-27 ENCOUNTER — Telehealth: Payer: Self-pay | Admitting: Nurse Practitioner

## 2022-01-27 ENCOUNTER — Inpatient Hospital Stay: Payer: 59

## 2022-01-27 DIAGNOSIS — D509 Iron deficiency anemia, unspecified: Secondary | ICD-10-CM | POA: Insufficient documentation

## 2022-01-27 DIAGNOSIS — C180 Malignant neoplasm of cecum: Secondary | ICD-10-CM | POA: Insufficient documentation

## 2022-01-27 DIAGNOSIS — C787 Secondary malignant neoplasm of liver and intrahepatic bile duct: Secondary | ICD-10-CM | POA: Insufficient documentation

## 2022-01-27 NOTE — Telephone Encounter (Signed)
Attempted to contact patient and sister in regards to rescheduling no showed appt today. No answer so voicemail was left for both numbers. ?

## 2022-01-29 ENCOUNTER — Other Ambulatory Visit (HOSPITAL_COMMUNITY): Payer: Self-pay

## 2022-01-30 ENCOUNTER — Encounter: Payer: Self-pay | Admitting: Nurse Practitioner

## 2022-01-30 ENCOUNTER — Inpatient Hospital Stay: Payer: 59 | Admitting: Nurse Practitioner

## 2022-01-30 ENCOUNTER — Inpatient Hospital Stay: Payer: 59

## 2022-01-30 ENCOUNTER — Telehealth: Payer: Self-pay

## 2022-01-30 ENCOUNTER — Other Ambulatory Visit: Payer: Self-pay

## 2022-01-30 VITALS — BP 126/89 | HR 96 | Temp 98.2°F | Resp 18 | Ht 75.0 in | Wt 163.0 lb

## 2022-01-30 DIAGNOSIS — C189 Malignant neoplasm of colon, unspecified: Secondary | ICD-10-CM

## 2022-01-30 DIAGNOSIS — D509 Iron deficiency anemia, unspecified: Secondary | ICD-10-CM | POA: Diagnosis not present

## 2022-01-30 DIAGNOSIS — C787 Secondary malignant neoplasm of liver and intrahepatic bile duct: Secondary | ICD-10-CM

## 2022-01-30 DIAGNOSIS — C180 Malignant neoplasm of cecum: Secondary | ICD-10-CM | POA: Diagnosis not present

## 2022-01-30 DIAGNOSIS — Z95828 Presence of other vascular implants and grafts: Secondary | ICD-10-CM

## 2022-01-30 LAB — CBC WITH DIFFERENTIAL (CANCER CENTER ONLY)
Abs Immature Granulocytes: 0.01 10*3/uL (ref 0.00–0.07)
Basophils Absolute: 0 10*3/uL (ref 0.0–0.1)
Basophils Relative: 1 %
Eosinophils Absolute: 0.2 10*3/uL (ref 0.0–0.5)
Eosinophils Relative: 4 %
HCT: 32.3 % — ABNORMAL LOW (ref 39.0–52.0)
Hemoglobin: 10.5 g/dL — ABNORMAL LOW (ref 13.0–17.0)
Immature Granulocytes: 0 %
Lymphocytes Relative: 20 %
Lymphs Abs: 1 10*3/uL (ref 0.7–4.0)
MCH: 31.2 pg (ref 26.0–34.0)
MCHC: 32.5 g/dL (ref 30.0–36.0)
MCV: 95.8 fL (ref 80.0–100.0)
Monocytes Absolute: 0.5 10*3/uL (ref 0.1–1.0)
Monocytes Relative: 9 %
Neutro Abs: 3.3 10*3/uL (ref 1.7–7.7)
Neutrophils Relative %: 66 %
Platelet Count: 131 10*3/uL — ABNORMAL LOW (ref 150–400)
RBC: 3.37 MIL/uL — ABNORMAL LOW (ref 4.22–5.81)
RDW: 16.2 % — ABNORMAL HIGH (ref 11.5–15.5)
WBC Count: 5 10*3/uL (ref 4.0–10.5)
nRBC: 0 % (ref 0.0–0.2)

## 2022-01-30 LAB — CMP (CANCER CENTER ONLY)
ALT: 18 U/L (ref 0–44)
AST: 21 U/L (ref 15–41)
Albumin: 4.1 g/dL (ref 3.5–5.0)
Alkaline Phosphatase: 147 U/L — ABNORMAL HIGH (ref 38–126)
Anion gap: 8 (ref 5–15)
BUN: 13 mg/dL (ref 6–20)
CO2: 23 mmol/L (ref 22–32)
Calcium: 9.8 mg/dL (ref 8.9–10.3)
Chloride: 106 mmol/L (ref 98–111)
Creatinine: 1.12 mg/dL (ref 0.61–1.24)
GFR, Estimated: >60 mL/min (ref >60)
Glucose, Bld: 124 mg/dL — ABNORMAL HIGH (ref 70–99)
Potassium: 3.8 mmol/L (ref 3.5–5.1)
Sodium: 137 mmol/L (ref 135–145)
Total Bilirubin: 0.6 mg/dL (ref 0.3–1.2)
Total Protein: 7.4 g/dL (ref 6.5–8.1)

## 2022-01-30 MED ORDER — SODIUM CHLORIDE 0.9% FLUSH
10.0000 mL | Freq: Once | INTRAVENOUS | Status: AC
  Administered 2022-01-30: 10 mL via INTRAVENOUS

## 2022-01-30 MED ORDER — HEPARIN SOD (PORK) LOCK FLUSH 100 UNIT/ML IV SOLN
500.0000 [IU] | Freq: Once | INTRAVENOUS | Status: AC
  Administered 2022-01-30: 500 [IU] via INTRAVENOUS

## 2022-01-30 NOTE — Telephone Encounter (Signed)
Spoke with Eric [name] at [facility] at [place]. She reopened the patient referral and schedule him on 02/04/22 at 245. The patient agree with the appointment.   ?

## 2022-01-30 NOTE — Patient Instructions (Signed)

## 2022-01-30 NOTE — Progress Notes (Signed)
?[place] ?OFFICE PROGRESS NOTE ? ? ?Diagnosis: Colon cancer ? ?INTERVAL HISTORY:  ? ?Eric [name] returns for follow-up.  He continues maintenance Xeloda.  He denies nausea/vomiting.  No mouth sores.  No diarrhea.  No change in hand symptoms. ? ?Objective: ? ?Vital signs in last 24 hours: ? ?Blood pressure 126/89, pulse 96, temperature 98.2 ?F (36.8 ?C), temperature source Oral, resp. rate 18, height $RemoveBe'6\' 3"'[id number]$  (1.905 m), weight 163 lb (73.9 kg), SpO2 98 %. ?  ? ?HEENT: No thrush or ulcers. ?Resp: Distant breath sounds.  No respiratory distress. ?Cardio: Regular rate and rhythm. ?GI: Abdomen soft and nontender.  No hepatomegaly.  Right abdomen ileostomy. ?Vascular: No leg edema. ?Skin: Palms with skin thickening, hyperpigmentation, dryness. ?Port-A-Cath without erythema. ? ? ?Lab Results: ? ?Lab Results  ?Component Value Date  ? WBC 5.0 01/30/2022  ? HGB 10.5 (L) 01/30/2022  ? HCT 32.3 (L) 01/30/2022  ? MCV 95.8 01/30/2022  ? PLT 131 (L) 01/30/2022  ? NEUTROABS 3.3 01/30/2022  ? ? ?Imaging: ? ?No results found. ? ?Medications: I have reviewed the patient's current medications. ? ?Assessment/Plan: ?Stage IV adenocarcinoma of the colon ?-11/15/2020 CT abdomen/pelvis with contrast-long segment masslike mural thickening of the cecum, posterior perforation at the site of the cecal wall thickening with multilocular right retroperitoneal abscess, indeterminate hypodensity in the inferior right lobe of the liver. ?-11/19/2020 CEA 0.8 ?-11/20/2020 MRI of the abdomen and pelvis with and without contrast-3.5 x 2.5 cm heterogeneous enhancing lesion in the inferior peripheral right liver with 2 smaller lesions with similar features. ?-11/20/2020 ultrasound-guided liver biopsy consistent with adenocarcinoma.  Foundation 1-MSS, tumor mutation burden 8,KRAS wt ?-11/25/2020 CT abdomen/pelvis with contrast-interval placement of a right lower quadrant percutaneous pigtail drainage catheter with near resolution of the retroperitoneal  fluid collection, redemonstrated large fungating mass at the cecal base measuring 11.9 x 9.1 x 8.4 cm with enlarged lymph nodes in the right lower quadrant mesocolon. ?-11/28/2020 exploratory laparotomy, right colectomy, ileostomy creation, cystoscopy with stent placement ?            -Surgical pathology showed adenocarcinoma, moderately differentiated, 12 cm, 0/14+ lymph nodes, lymphovascular space invasion present, radial margin involved by invasive adenocarcinoma, appendix with intraluminal             adenocarcinoma, pT4, PN 0 ?-12/09/2020 CT abdomen/pelvis with contrast-interval right colectomy and right lower quadrant ileostomy, stable right retroperitoneal soft tissue density along the psoas and iliacus muscles, mild progression of liver metastases since prior study. ?-Cycle 1 FOLFOX 12/24/2020 ?-Cycle 2 FOLFOX 01/06/2021 ?-Cycle 3 FOLFOX 01/20/2021 ?-Cycle 4 FOLFOX 02/03/2021 ?-Cycle 5 FOLFOX 02/17/2021 ?-Cycle 6 FOLFOX 03/03/2021 ?-Restaging CTs 03/05/2021- substantial interval decrease in size of predominantly hypodense lesions throughout the liver.  Significant interval resolution of previously seen extensive, heterogeneous soft tissue in the right retroperitoneal soft tissues and paracolic gutter overlying the psoas and iliacus muscles. ?-Cycle 7 FOLFOX 03/19/2021 ?-Cycle 8 FOLFOX 04/03/2021, oxaliplatin dose reduced secondary to neutropenia and thrombocytopenia ?-Cycle 9 FOLFOX 04/15/2021 ?-Cycle 10 FOLFOX 04/29/2021 ?-Cycle 11 FOLFOX 05/13/2021, oxaliplatin held secondary to neuropathy ?-Cycle 12 FOLFOX 05/27/2021, oxaliplatin held secondary to neuropathy ?-CT abdomen/pelvis 06/09/2021-decrease size of remaining dominant liver lesion, other liver lesions are calcified and punctate, subtle density in the posterior right bladder, unchanged sclerotic right iliac lesion ?-Cycle 13 FOLFOX 06/10/2021, oxaliplatin held secondary to neuropathy ?-Maintenance Xeloda 06/25/2021 ?-MRI liver 07/03/2021-no significant change in subtle  irregular nonenhancing remnant liver lesions of the inferior right lobe of the liver measuring 1.2 x 0.6  cm and adjacent to the gallbladder fossa measuring no greater than 0.5 cm. ?-Maintenance Xeloda continued ?-Tumor conference 08/06/2021-rescan at a 41-month interval ?-Xeloda decreased to 1000 mg a.m., 500 mg p.m. 08/05/2021 (due to hand-foot syndrome) ?-MRI liver 10/31/2021-treated liver lesion within the inferior right hepatic lobe measuring 0.9 cm.  Small treated lesion within the posterior medial right hepatic lobe measuring 5 mm, unchanged. ?-Maintenance Xeloda continued ?  ?2.  Sepsis secondary to perforated colon ?3.  Retroperitoneal abscess ?4.  Protein calorie malnutrition ?5.  Thrombocytosis ?6.  Iron deficiency anemia ?7.  COVID-19 infection, 11/15/2020 ?8.  Tobacco abuse ?9.  Family history of colon cancer ?10.  Port-A-Cath placement, Dr. 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  Admission 09/22/2021 with dehydration and probable COPD flare ?  ? ?Disposition: Mr. Wisenbaker appears unchanged.  There is no clinical evidence of disease progression.  He will continue maintenance Xeloda.  He will discontinue Xeloda and contact the office with progressive hand/foot symptoms. ? ?CBC and chemistry panel reviewed.  Labs adequate to continue with Xeloda. ? ?He will return for follow-up in 3 weeks.  He will contact the office in the interim with any problems. ? ? ? ?Ned Card ANP/GNP-BC  ? ?01/30/2022  ?1:46 PM ? ? ? ? ? ? ? ?

## 2022-02-02 ENCOUNTER — Ambulatory Visit (AMBULATORY_SURGERY_CENTER): Payer: 59 | Admitting: Gastroenterology

## 2022-02-02 ENCOUNTER — Encounter: Payer: Self-pay | Admitting: Gastroenterology

## 2022-02-02 VITALS — BP 146/90 | HR 68 | Temp 98.4°F | Resp 12 | Ht 75.0 in | Wt 165.0 lb

## 2022-02-02 DIAGNOSIS — K5289 Other specified noninfective gastroenteritis and colitis: Secondary | ICD-10-CM

## 2022-02-02 DIAGNOSIS — C189 Malignant neoplasm of colon, unspecified: Secondary | ICD-10-CM | POA: Diagnosis present

## 2022-02-02 DIAGNOSIS — C787 Secondary malignant neoplasm of liver and intrahepatic bile duct: Secondary | ICD-10-CM

## 2022-02-02 MED ORDER — FLEET ENEMA 7-19 GM/118ML RE ENEM
1.0000 | ENEMA | Freq: Once | RECTAL | Status: AC
  Administered 2022-02-02: 1 via RECTAL

## 2022-02-02 MED ORDER — SODIUM CHLORIDE 0.9 % IV SOLN: 500.0000 mL | Freq: Once | INTRAVENOUS | Status: DC

## 2022-02-02 NOTE — Patient Instructions (Signed)
Impression/Recommendations: ? ?Resume previous diet. ?Continue present medications. ? ?Findings will be discussed with Dr. [name]. ? ?Full colonoscopy recommended if/when patient has ileostomy takedown. ? ?YOU HAD AN ENDOSCOPIC PROCEDURE TODAY AT [facility]:   Refer to the procedure report that was given to you for any specific questions about what was found during the examination.  If the procedure report does not answer your questions, please call your gastroenterologist to clarify.  If you requested that your care partner not be given the details of your procedure findings, then the procedure report has been included in a sealed envelope for you to review at your convenience later. ? ?YOU SHOULD EXPECT: Some feelings of bloating in the abdomen. Passage of more gas than usual.  Walking can help get rid of the air that was put into your GI tract during the procedure and reduce the bloating. If you had a lower endoscopy (such as a colonoscopy or flexible sigmoidoscopy) you may notice spotting of blood in your stool or on the toilet paper. If you underwent a bowel prep for your procedure, you may not have a normal bowel movement for a few days. ? ?Please Note:  You might notice some irritation and congestion in your nose or some drainage.  This is from the oxygen used during your procedure.  There is no need for concern and it should clear up in a day or so. ? ?SYMPTOMS TO REPORT IMMEDIATELY: ? ?Following lower endoscopy (colonoscopy or flexible sigmoidoscopy): ? Excessive amounts of blood in the stool ? Significant tenderness or worsening of abdominal pains ? Swelling of the abdomen that is new, acute ? Fever of 100?F or higher ? ?For urgent or emergent issues, a gastroenterologist can be reached at any hour by calling [phone]. ?Do not use MyChart messaging for urgent concerns.  ? ? ?DIET:  We do recommend a small meal at first, but then you may proceed to your regular diet.  Drink plenty of  fluids but you should avoid alcoholic beverages for 24 hours. ? ?ACTIVITY:  You should plan to take it easy for the rest of today and you should NOT DRIVE or use heavy machinery until tomorrow (because of the sedation medicines used during the test).   ? ?FOLLOW UP: ?Our staff will call the number listed on your records 48-72 hours following your procedure to check on you and address any questions or concerns that you may have regarding the information given to you following your procedure. If we do not reach you, we will leave a message.  We will attempt to reach you two times.  During this call, we will ask if you have developed any symptoms of COVID 19. If you develop any symptoms (ie: fever, flu-like symptoms, shortness of breath, cough etc.) before then, please call [phone].  If you test positive for Covid 19 in the 2 weeks post procedure, please call and report this information to [place].   ? ?If any biopsies were taken you will be contacted by phone or by letter within the next 1-3 weeks.  Please call us at [phone] if you have not heard about the biopsies in 3 weeks.  ? ? ?SIGNATURES/CONFIDENTIALITY: ?You and/or your care partner have signed paperwork which will be entered into your electronic medical record.  These signatures attest to the fact that that the information above on your After Visit Summary has been reviewed and is understood.  Full responsibility of the confidentiality of this discharge information  lies with you and/or your care-partner.  ? ? ?

## 2022-02-02 NOTE — Progress Notes (Signed)
Report given to PACU, vss 

## 2022-02-02 NOTE — Op Note (Signed)
[facility] ?Patient Name: Eric [name] ?Procedure Date: 02/02/2022 9:33 AM ?MRN: [id number] ?Endoscopist: Willaim Rayas. Adela Lank , MD ?Age: 51 ?Referring MD:  ?Date of Birth: [birth year] ?Gender: Male ?Account #: [id number] ?Procedure:                Colonoscopy ?Indications:              High risk colon cancer surveillance: Personal  ?                          history of colon cancer - right sided colon cancer  ?                          with metastatic disease to the liver s/p right  ?                          sided hemicolectomy and divertiing ileostomy. On  ?                          Xeloda, improved disease burden. Considering  ?                          takedown of ileostomy, no prior colonoscopy.  ?                          Patient cannot prep his remnant colon due to  ?                          diversion, enemas given - patient endorsed bleeding  ?                          with the enemas. ?Medicines:                Monitored Anesthesia Care ?Procedure:                Pre-Anesthesia Assessment: ?                          - Prior to the procedure, a History and Physical  ?                          was performed, and patient medications and  ?                          allergies were reviewed. The patient's tolerance of  ?                          previous anesthesia was also reviewed. The risks  ?                          and benefits of the procedure and the sedation  ?                          options and risks were discussed with the patient.  ?  All questions were answered, and informed consent  ?                          was obtained. Prior Anticoagulants: The patient has  ?                          taken no previous anticoagulant or antiplatelet  ?                          agents. ASA Grade Assessment: III - A patient with  ?                          severe systemic disease. After reviewing the risks  ?                          and benefits, the patient was deemed in  ?                           satisfactory condition to undergo the procedure. ?                          After obtaining informed consent, the colonoscope  ?                          was passed under direct vision. Throughout the  ?                          procedure, the patient's blood pressure, pulse, and  ?                          oxygen saturations were monitored continuously. The  ?                          Olympus PCF-H190DL (#[id number]) Colonoscope was  ?                          introduced through the anus with the intention of  ?                          advancing to the surgical anastomosis in the  ?                          transverse colon. The scope was advanced to the  ?                          sigmoid colon before the procedure was aborted.  ?                          Medications were given. The colonoscopy was  ?                          technically difficult and complex due to restricted  ?  mobility of the colon and poor visualization. The  ?                          patient tolerated the procedure well. The quality  ?                          of the bowel preparation was inadequate. The rectum  ?                          was photographed. ?Scope In: 9:40:49 AM ?Scope Out: 9:44:46 AM ?Total Procedure Duration: 0 hours 3 minutes 57 seconds  ?Findings:                 Hemorrhoids were found on perianal exam. ?                          Diffuse inflammation characterized by erythema,  ?                          friability and granularity was found in the rectum  ?                          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 Upon initial exam of the rectum  ?                          it was irritated with stigmata of recent bleeding  ?                          from contact with the enema. Significant mucous  ?                          burden in the colon impaired visualization. Minimal  ?                          air  insufflation used, mostly water immersion.  ?                          Significant restricted mobility of the left colon.  ?                          Mucosa inflamed from diversion colitis, contact  ?                          hemorrhage from the scope and with insufflation.  ?                          Given these findings and poor prep, exam was  ?                          aborted in the sigmoid colon ?  Of what was visualized the exam there were no  ?                          polyps but poor prep impaired visualization. ?Complications:            No immediate complications. Estimated blood loss:  ?                          Minimal. ?Estimated Blood Loss:     Estimated blood loss was minimal. ?Impression:               - Preparation of the colon was inadequate. ?                          - Hemorrhoids found on perianal exam. ?                          - Diffuse inflammation and mucous was found in the  ?                          rectum and in the sigmoid colon secondary to  ?                          diversion colitis ?                          - Irritation of the rectum with contact bleeding  ?                          from the enema. Friable mucosa of the colon with  ?                          contact and air insufflation, procedure aborted as  ?                          outlined ?                          - Restricted mobility of the colon ?                          Unfortunately procedure aborted given poor prep and  ?                          inflammatory changes noted in the setting of  ?                          restricted mobility of the sigmoid colon. Do not  ?                          think it will be possible to endoscopically clear  ?                          his colon safely prior to takedown of ileostomy.  ?  Will discuss with referring Dr. Freida Busman. Patient  ?                          denies much baseline symptoms of diversion colitis.  ?                           If he has symptoms from this chronically that are  ?                          bothersome can offer therapy prior to takedown. ?Recommendation:           - Patient has a contact number available for  ?                          emergencies. The signs and symptoms of potential  ?                          delayed complications were discussed with the  ?                          patient. Return to normal activities tomorrow.  ?                          Written discharge instructions were provided to the  ?                          patient. ?                          - Resume previous diet. ?                          - Continue present medications. ?                          - Will discuss findings with Dr. Freida Busman ?                          - Full colonoscopy recommended if / when patient  ?                          has takedown of ileostomy ?Viviann Spare P. Jayonna Meyering, MD ?02/02/2022 10:02:30 AM ?This report has been signed electronically. ?

## 2022-02-02 NOTE — Progress Notes (Signed)
VS-CW  Pt's states no medical or surgical changes since previsit or office visit.  

## 2022-02-02 NOTE — Progress Notes (Signed)
[facility] History and Physical ? ? ?Primary Care Physician:  [facility], [facility] ? ? ?Reason for Procedure:   History of colon cancer ? ?Plan:    colonoscopy ? ? ? ? ?HPI: Eric [name] is a 51 y.o. male  here for colonoscopy surveillance - history of right sided colon cancer s/p R sided colectomy - Dr. [name] - no prior colonoscopy. Unfortunately metastatic disease of the liver on Xeloda but stable. He has right sided ileostomy and remainder of colon is not in continuity with his small bowel. Has used 2 enemas for prep. Counseled patient visualization may not be good, will do the best we can to make sure no high risk lesions in his colon prior to ileostomy takedown. I have discussed risks / benefits of the procedure and anesthesia and he wants to proceed.  ? ? ?Past Medical History:  ?Diagnosis Date  ? COPD (chronic obstructive pulmonary disease) ([place])   ? COVID-19 virus infection 11/15/2020  ? ? ?Past Surgical History:  ?Procedure Laterality Date  ? CYSTOSCOPY WITH STENT PLACEMENT N/A 11/28/2020  ? Procedure: CYSTOSCOPY WITH STENT PLACEMENT;  Surgeon: Janith Lima, MD;  Location: [facility];  Service: Urology;  Laterality: N/A;  ? IR RADIOLOGIST EVAL & MGMT  07/22/2021  ? LAPAROTOMY N/A 11/28/2020  ? Procedure: EXPLORATORY LAPAROTOMY;  Surgeon: Ralene Ok, MD;  Location: [facility];  Service: General;  Laterality: N/A;  DOW CASE TO START AT 730AM 4 HOUR CASE  ? OSTOMY N/A 11/28/2020  ? Procedure: ILEOSTOMY CREATION;  Surgeon: Ralene Ok, MD;  Location: [place] [facility];  Service: General;  Laterality: N/A;  ? PARTIAL COLECTOMY Right 11/28/2020  ? Procedure: RIGHT COLECTOMY;  Surgeon: Ralene Ok, MD;  Location: [facility];  Service: General;  Laterality: Right;  ? PORTACATH PLACEMENT Right 12/12/2020  ? Procedure: INSERTION PORT-A-CATH;  Surgeon: Dwan Bolt, MD;  Location: [facility];  Service: General;  Laterality: Right;  ? ? ?Prior to Admission medications   ?Medication Sig Start Date End Date  Taking? Authorizing Provider  ?albuterol (VENTOLIN HFA) 108 (90 Base) MCG/ACT inhaler Inhale 2 puffs into the lungs every 6 (six) hours as needed for wheezing or shortness of breath. 09/24/21  Yes Ghimire, Henreitta Leber, MD  ?capecitabine (XELODA) 500 MG tablet Take 2 tablets every morning and 1 tablet every evening 01/16/22  Yes Ladell Pier, MD  ?diphenoxylate-atropine (LOMOTIL) 2.5-0.025 MG tablet Take 1 tablet by mouth 2 times a day as needed. 01/23/22  Yes Ladell Pier, MD  ?fluticasone furoate-vilanterol (BREO ELLIPTA) 100-25 MCG/ACT AEPB Inhale 1 puff into the lungs daily. 09/25/21  Yes Ghimire, Henreitta Leber, MD  ?gabapentin (NEURONTIN) 300 MG capsule TAKE 1 CAPSULE BY MOUTH 3 TIMES A DAY 11/04/21  Yes Owens Shark, NP  ?Oxycodone HCl 10 MG TABS Take 1/2 tablet (5 mg total) by mouth every 6 hours as needed. 01/23/22  Yes Ladell Pier, MD  ?pantoprazole (PROTONIX) 40 MG tablet Take 1 tablet by mouth daily. 11/25/21  Yes Owens Shark, NP  ?polycarbophil (FIBERCON) 625 MG tablet Take 1 tablet (625 mg total) by mouth daily. 01/06/21  Yes Ladell Pier, MD  ?prochlorperazine (COMPAZINE) 10 MG tablet Take 1 tablet (10 mg total) by mouth every 6 (six) hours as needed for nausea. 12/12/21  Yes Ladell Pier, MD  ?diazepam (VALIUM) 5 MG tablet Take 1/2 tablet 30 to 60 minutes prior to MRI. May repeat x 1 10/27/21   Ladell Pier, MD  ?diazepam (  VALIUM) 5 MG tablet Take 1/2 tablet by mouth 30 - 60 minutes  prior to MRI --bring other 1/2 tablet to appointment. 10/27/21   Ladell Pier, MD  ?lidocaine-prilocaine (EMLA) cream Apply to port site 1-2 hours prior to stick and cover with plastic wrap to numb site as directed. 05/27/21   Ladell Pier, MD  ?ondansetron (ZOFRAN) 8 MG tablet Take 1 tablet (8 mg total) by mouth every 8 (eight) hours as needed for vomiting or nausea. 11/25/21   Owens Shark, NP  ? ? ?Current Outpatient Medications  ?Medication Sig Dispense Refill  ? albuterol (VENTOLIN HFA) 108 (90  Base) MCG/ACT inhaler Inhale 2 puffs into the lungs every 6 (six) hours as needed for wheezing or shortness of breath. 18 g 0  ? capecitabine (XELODA) 500 MG tablet Take 2 tablets every morning and 1 tablet every evening 90 tablet 0  ? diphenoxylate-atropine (LOMOTIL) 2.5-0.025 MG tablet Take 1 tablet by mouth 2 times a day as needed. 30 tablet 1  ? fluticasone furoate-vilanterol (BREO ELLIPTA) 100-25 MCG/ACT AEPB Inhale 1 puff into the lungs daily. 60 each 0  ? gabapentin (NEURONTIN) 300 MG capsule TAKE 1 CAPSULE BY MOUTH 3 TIMES A DAY 90 capsule 1  ? Oxycodone HCl 10 MG TABS Take 1/2 tablet (5 mg total) by mouth every 6 hours as needed. 25 tablet 0  ? pantoprazole (PROTONIX) 40 MG tablet Take 1 tablet by mouth daily. 30 tablet 2  ? polycarbophil (FIBERCON) 625 MG tablet Take 1 tablet (625 mg total) by mouth daily. 30 tablet 1  ? prochlorperazine (COMPAZINE) 10 MG tablet Take 1 tablet (10 mg total) by mouth every 6 (six) hours as needed for nausea. 60 tablet 1  ? diazepam (VALIUM) 5 MG tablet Take 1/2 tablet 30 to 60 minutes prior to MRI. May repeat x 1 1 tablet 0  ? diazepam (VALIUM) 5 MG tablet Take 1/2 tablet by mouth 30 - 60 minutes  prior to MRI --bring other 1/2 tablet to appointment. 1 tablet 0  ? lidocaine-prilocaine (EMLA) cream Apply to port site 1-2 hours prior to stick and cover with plastic wrap to numb site as directed. 30 g 2  ? ondansetron (ZOFRAN) 8 MG tablet Take 1 tablet (8 mg total) by mouth every 8 (eight) hours as needed for vomiting or nausea. 30 tablet 2  ? ?Current Facility-Administered Medications  ?Medication Dose Route Frequency Provider Last Rate Last Admin  ? 0.9 %  sodium chloride infusion  500 mL Intravenous Once Donold Marotto, Carlota Raspberry, MD      ? ? ?Allergies as of 02/02/2022  ? (No Known Allergies)  ? ? ?Family History  ?Problem Relation Age of Onset  ? Diabetes Sister   ? Colon cancer Other   ?     Family member unknown  ? Esophageal cancer Neg Hx   ? Stomach cancer Neg Hx   ?  Pancreatic cancer Neg Hx   ? Colon polyps Neg Hx   ? ? ?Social History  ? ?Socioeconomic History  ? Marital status: Single  ?  Spouse name: Not on file  ? Number of children: Not on file  ? Years of education: Not on file  ? Highest education level: Not on file  ?Occupational History  ? Not on file  ?Tobacco Use  ? Smoking status: Every Day  ?  Packs/day: 0.50  ?  Years: 30.00  ?  Pack years: 15.00  ?  Types: Cigarettes  ? Smokeless  tobacco: Never  ? Tobacco comments:  ?  Up to 1.5 PPD since age 53  ?Vaping Use  ? Vaping Use: Every day  ?Substance and Sexual Activity  ? Alcohol use: Not Currently  ? Drug use: Yes  ?  Types: Marijuana  ? Sexual activity: Not on file  ?Other Topics Concern  ? Not on file  ?Social History Narrative  ? Not on file  ? ?Social Determinants of Health  ? ?Financial Resource Strain: Not on file  ?Food Insecurity: Not on file  ?Transportation Needs: Not on file  ?Physical Activity: Inactive  ? Days of Exercise per Week: 0 days  ? Minutes of Exercise per Session: 0 min  ?Stress: No Stress Concern Present  ? Feeling of Stress : Only a little  ?Social Connections: Moderately Isolated  ? Frequency of Communication with Friends and Family: More than three times a week  ? Frequency of Social Gatherings with Friends and Family: Three times a week  ? Attends Religious Services: Never  ? Active Member of Clubs or Organizations: Yes  ? Attends Archivist Meetings: Never  ? Marital Status: Never married  ?Intimate Partner Violence: Not At Risk  ? Fear of Current or Ex-Partner: No  ? Emotionally Abused: No  ? Physically Abused: No  ? Sexually Abused: No  ? ? ?Review of Systems: ?All other review of systems negative except as mentioned in the HPI. ? ?Physical Exam: ?Vital signs ?BP (!) 136/111   Pulse 85   Temp 98.4 ?F (36.9 ?C)   Resp 17   Ht '6\' 3"'$  (1.905 m)   Wt 165 lb (74.8 kg)   SpO2 96%   BMI 20.62 kg/m?  ? ?General:   Alert,  Well-developed, pleasant and cooperative in NAD ?Lungs:   Clear throughout to auscultation.   ?Heart:  Regular rate and rhythm ?Abdomen:  Soft, nontender, nondistended, ileostomy in RLQ  ?Neuro/Psych:  Alert and cooperative. Normal mood and affect. A and O x 3 ? ?Margaretmary Dys

## 2022-02-04 ENCOUNTER — Telehealth: Payer: Self-pay | Admitting: *Deleted

## 2022-02-04 ENCOUNTER — Other Ambulatory Visit (HOSPITAL_COMMUNITY): Payer: Self-pay

## 2022-02-04 ENCOUNTER — Encounter: Payer: Self-pay | Admitting: Pulmonary Disease

## 2022-02-04 ENCOUNTER — Telehealth: Payer: Self-pay

## 2022-02-04 ENCOUNTER — Ambulatory Visit (INDEPENDENT_AMBULATORY_CARE_PROVIDER_SITE_OTHER): Payer: 59 | Admitting: Pulmonary Disease

## 2022-02-04 VITALS — BP 124/74 | HR 81 | Temp 97.5°F | Ht 75.0 in | Wt 170.0 lb

## 2022-02-04 DIAGNOSIS — J432 Centrilobular emphysema: Secondary | ICD-10-CM | POA: Diagnosis not present

## 2022-02-04 MED ORDER — FLUTICASONE FUROATE-VILANTEROL 200-25 MCG/ACT IN AEPB
1.0000 | INHALATION_SPRAY | Freq: Every day | RESPIRATORY_TRACT | 5 refills | Status: AC
  Filled 2022-02-04: qty 60, 30d supply, fill #0
  Filled 2022-04-20: qty 60, 30d supply, fill #1
  Filled 2022-05-25: qty 60, 30d supply, fill #2

## 2022-02-04 MED ORDER — FLUTICASONE FUROATE-VILANTEROL 100-25 MCG/ACT IN AEPB
1.0000 | INHALATION_SPRAY | Freq: Every day | RESPIRATORY_TRACT | 5 refills | Status: DC
  Filled 2022-02-04: qty 60, 30d supply, fill #0

## 2022-02-04 NOTE — Telephone Encounter (Signed)
No answer, left message to call back later today, B.Rielle Schlauch RN. 

## 2022-02-04 NOTE — Telephone Encounter (Signed)
No answer on second attempt follow up call.  ? ?

## 2022-02-04 NOTE — Progress Notes (Signed)
? ? ?Subjective:  ? ?PATIENT ID: Eric [name] GENDER: male DOB: [birth year], MRN: [id number] ? ? ?HPI ? ?Chief Complaint  ?Patient presents with  ? Consult  ?  COPD   ? ? ?Reason for Visit: New consult for COPD ? ?Eric [name] is a 51 year old male active smoker with stage IV adenocarcinoma of the colon s/p chemotherapy s/p right hemicolectomy and COPD who presents for pre-op evaluation. ? ?Note by Dr. [name] on 12/02/21 reviewed. He was referred to surgery for a ileostomy reversal and possible resection liver lesions. Referred to Pulmonary for PFTs and pre-op clearance. ? ?He reports shortness of breath and unproductive cough. Occurs in the morning. Improves with rest. Worsens with getting up too quickly. Denies wheezing. Reports occasional runny nose. Occasional chest congestion. He is compliant with Breo daily which he started in Feb. Uses albuterol once daily every morning. Continues to smoke. Down to 1/2 ppd. Vapes occasionally. He has no limitation in activity. Able to climb a flight of stair at a slow pace. Able to walk on flat surfaces >200 ft without stopping. ? ?Social History: ?Active smoker. Previously 1.5 ppd x 30 years ? ?I have personally reviewed patient's past medical/family/social history, allergies, current medications. ? ?Past Medical History:  ?Diagnosis Date  ? COPD (chronic obstructive pulmonary disease) ([place])   ? COVID-19 virus infection 11/15/2020  ?  ? ?Family History  ?Problem Relation Age of Onset  ? Diabetes Sister   ? Colon cancer Other   ?     Family member unknown  ? Esophageal cancer Neg Hx   ? Stomach cancer Neg Hx   ? Pancreatic cancer Neg Hx   ? Colon polyps Neg Hx   ?  ? ?Social History  ? ?Occupational History  ? Not on file  ?Tobacco Use  ? Smoking status: Every Day  ?  Packs/day: 0.50  ?  Years: 30.00  ?  Pack years: 15.00  ?  Types: Cigarettes, E-cigarettes  ? Smokeless tobacco: Never  ? Tobacco comments:  ?  Pt currently using vape and 1-2 cigarettes every few days ARJ 02/04/22   ?Vaping Use  ? Vaping Use: Every day  ?Substance and Sexual Activity  ? Alcohol use: Not Currently  ? Drug use: Yes  ?  Types: Marijuana  ? Sexual activity: Not on file  ? ? ?No Known Allergies  ? ?Outpatient Medications Prior to Visit  ?Medication Sig Dispense Refill  ? albuterol (VENTOLIN HFA) 108 (90 Base) MCG/ACT inhaler Inhale 2 puffs into the lungs every 6 (six) hours as needed for wheezing or shortness of breath. 18 g 0  ? capecitabine (XELODA) 500 MG tablet Take 2 tablets every morning and 1 tablet every evening 90 tablet 0  ? diazepam (VALIUM) 5 MG tablet Take 1/2 tablet 30 to 60 minutes prior to MRI. May repeat x 1 1 tablet 0  ? diazepam (VALIUM) 5 MG tablet Take 1/2 tablet by mouth 30 - 60 minutes  prior to MRI --bring other 1/2 tablet to appointment. 1 tablet 0  ? diphenoxylate-atropine (LOMOTIL) 2.5-0.025 MG tablet Take 1 tablet by mouth 2 times a day as needed. 30 tablet 1  ? fluticasone furoate-vilanterol (BREO ELLIPTA) 100-25 MCG/ACT AEPB Inhale 1 puff into the lungs daily. 60 each 0  ? gabapentin (NEURONTIN) 300 MG capsule TAKE 1 CAPSULE BY MOUTH 3 TIMES A DAY 90 capsule 1  ? lidocaine-prilocaine (EMLA) cream Apply to port site 1-2 hours prior to stick and cover with  plastic wrap to numb site as directed. 30 g 2  ? ondansetron (ZOFRAN) 8 MG tablet Take 1 tablet (8 mg total) by mouth every 8 (eight) hours as needed for vomiting or nausea. 30 tablet 2  ? Oxycodone HCl 10 MG TABS Take 1/2 tablet (5 mg total) by mouth every 6 hours as needed. 25 tablet 0  ? pantoprazole (PROTONIX) 40 MG tablet Take 1 tablet by mouth daily. 30 tablet 2  ? polycarbophil (FIBERCON) 625 MG tablet Take 1 tablet (625 mg total) by mouth daily. 30 tablet 1  ? prochlorperazine (COMPAZINE) 10 MG tablet Take 1 tablet (10 mg total) by mouth every 6 (six) hours as needed for nausea. 60 tablet 1  ? ?Facility-Administered Medications Prior to Visit  ?Medication Dose Route Frequency Provider Last Rate Last Admin  ? 0.9 %  sodium  chloride infusion  500 mL Intravenous Once Armbruster, Carlota Raspberry, MD      ? ? ?Review of Systems  ?Constitutional:  Negative for chills, diaphoresis, fever, malaise/fatigue and weight loss.  ?HENT:  Negative for congestion.   ?Respiratory:  Positive for shortness of breath. Negative for cough, hemoptysis, sputum production and wheezing.   ?Cardiovascular:  Positive for leg swelling. Negative for chest pain and palpitations.  ? ? ?Objective:  ? ?Vitals:  ? 02/04/22 1451  ?BP: 124/74  ?Pulse: 81  ?Temp: (!) 97.5 ?F (36.4 ?C)  ?TempSrc: Oral  ?SpO2: 96%  ?Weight: 170 lb (77.1 kg)  ?Height: '6\' 3"'$  (1.905 m)  ? ?SpO2: 96 % ?O2 Device: None (Room air) ? ?Physical Exam: ?General: Well-appearing, no acute distress ?HENT: [place], AT ?Eyes: EOMI, no scleral icterus ?Respiratory: Clear to auscultation bilaterally.  No crackles, wheezing or rales ?Cardiovascular: RRR, -M/R/G, no JVD ?Extremities:-Edema,-tenderness ?Neuro: AAO x4, CNII-XII grossly intact ?Psych: Normal mood, normal affect ? ?Data Reviewed: ? ?Imaging: ?CTA 11/15/20 - Mild centrilobular emphysema, upper lobe predominant ? ?PFT: ?None on file ? ?Labs: ?CBC ?   ?Component Value Date/Time  ? WBC 5.0 01/30/2022 1310  ? WBC 12.7 (H) 09/23/2021 0335  ? RBC 3.37 (L) 01/30/2022 1310  ? HGB 10.5 (L) 01/30/2022 1310  ? HCT 32.3 (L) 01/30/2022 1310  ? PLT 131 (L) 01/30/2022 1310  ? MCV 95.8 01/30/2022 1310  ? MCH 31.2 01/30/2022 1310  ? MCHC 32.5 01/30/2022 1310  ? RDW 16.2 (H) 01/30/2022 1310  ? LYMPHSABS 1.0 01/30/2022 1310  ? MONOABS 0.5 01/30/2022 1310  ? EOSABS 0.2 01/30/2022 1310  ? BASOSABS 0.0 01/30/2022 1310  ? ?Absolute eos 01/30/22 - 200 ? ?   ?Assessment & Plan:  ? 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 Will plan for PFTs however this will not change current management for COPD and should not delay surgery. Pre-op evaluation risk noted  below.  ? ?Emphysema - symptomatic, not in exacerbation ?--Increase Breo 200-25 mcg ONE puff ONCE a day. Will call you with results ?--ORDER pulmonary function tests as soon as available ? ?Peri-operative Assessment of Pulmonary Risk for Non-Thoracic Surgery: ? ?For [name], risk of perioperative pulmonary complications is increased by: ? '[ ]'$  Age greater than 57 years ? '[X]'$ COPD ? '[ ]'$  Serum albumin <3.5 ? '[X]'$  Smoking ? '[ ]'$  Obstructive sleep apnea ? '[ ]'$  NYHA Class II Pulmonary Hypertension ? ?Respiratory complications generally occur in 1% of ASA Class I patients, 5% of ASA Class II and 10% of  ASA Class III-IV patients These complications rarely result in mortality and include postoperative pneumonia, atelectasis, pulmonary embolism, ARDS and increased time requiring postoperative mechanical ventilation. ? ?ARISCAT score (41 points) increased to intermediate risk due to prolonged surgery >3 hours. ?13.3% risk of in-hospital post-op pulmonary complications (composite including respiratory failure, respiratory infection, pleural effusion, atelectasis, pneumothorax, bronchospasm, aspiration pneumonitis) ? ?Overall, I recommend proceeding with the surgery if the risk for respiratory complications are outweighed by the potential benefits. This will need to be discussed between the patient and surgeon. ? ?To reduce risks of respiratory complications, I recommend: ?--Smoking cessation 6-8 weeks if possible ?--Pre- and post-operative incentive spirometry performed frequently while awake ?--Avoiding use of pancuronium during anesthesia. ? ?I have discussed the risk factors and recommendations above with the patient. ? ?Health Maintenance ?Immunization History  ?Administered Date(s) Administered  ? Influenza,inj,Quad PF,6+ Mos 08/26/2021  ? ?CT Lung Screen - not due. Age 9 ? ?Orders Placed This Encounter  ?Procedures  ? Pulmonary function test  ?  Standing Status:   Future  ?  Standing Expiration Date:   02/05/2023  ?  Order  Specific Question:   Where should this test be performed?  ?  Answer:   [facility]  ? ?Meds ordered this encounter  ?Medications  ? DISCONTD: fluticasone furoate-vilanterol (BREO ELLIPTA) 100-25 MCG/AC

## 2022-02-04 NOTE — Patient Instructions (Addendum)
?  Emphysema - symptomatic, not in exacerbation ?--Increase Breo 200-25 mcg ONE puff ONCE a day ?--ORDER pulmonary function tests as soon as available ? ? ?Schedule PFTs when next available. Will call you with results ?Follow-up with me in 2 months ? ? ? ? ?Peri-operative Assessment of Pulmonary Risk for Non-Thoracic Surgery: ? ?For Eric [name], risk of perioperative pulmonary complications is increased by: ? '[ ]'$  Age greater than 28 years ? '[X]'$ COPD ? '[ ]'$  Serum albumin <3.5 ? '[X]'$  Smoking ? '[ ]'$  Obstructive sleep apnea ? '[ ]'$  NYHA Class II Pulmonary Hypertension ? ?Respiratory complications generally occur in 1% of ASA Class I patients, 5% of ASA Class II and 10% of ASA Class III-IV patients These complications rarely result in mortality and include postoperative pneumonia, atelectasis, pulmonary embolism, ARDS and increased time requiring postoperative mechanical ventilation. ? ?ARISCAT score (41 points) increased to intermediate risk due to prolonged surgery >3 hours. ?13.3% risk of in-hospital post-op pulmonary complications (composite including respiratory failure, respiratory infection, pleural effusion, atelectasis, pneumothorax, bronchospasm, aspiration pneumonitis) ? ?Overall, I recommend proceeding with the surgery if the risk for respiratory complications are outweighed by the potential benefits. This will need to be discussed between the patient and surgeon. ? ?To reduce risks of respiratory complications, I recommend: ?--Smoking cessation 6-8 weeks if possible ?--Pre- and post-operative incentive spirometry performed frequently while awake ?--Avoiding use of pancuronium during anesthesia. ? ?I have discussed the risk factors and recommendations above with the patient. ?

## 2022-02-05 ENCOUNTER — Encounter: Payer: Self-pay | Admitting: Pulmonary Disease

## 2022-02-10 ENCOUNTER — Other Ambulatory Visit (HOSPITAL_COMMUNITY): Payer: Self-pay

## 2022-02-10 ENCOUNTER — Other Ambulatory Visit: Payer: Self-pay | Admitting: Oncology

## 2022-02-10 DIAGNOSIS — C787 Secondary malignant neoplasm of liver and intrahepatic bile duct: Secondary | ICD-10-CM

## 2022-02-13 ENCOUNTER — Other Ambulatory Visit: Payer: Self-pay | Admitting: Nurse Practitioner

## 2022-02-13 ENCOUNTER — Other Ambulatory Visit (HOSPITAL_COMMUNITY): Payer: Self-pay

## 2022-02-13 DIAGNOSIS — C189 Malignant neoplasm of colon, unspecified: Secondary | ICD-10-CM

## 2022-02-13 MED ORDER — GABAPENTIN 300 MG PO CAPS
ORAL_CAPSULE | ORAL | 1 refills | Status: DC
  Filled 2022-02-13: qty 90, 30d supply, fill #0
  Filled 2022-04-06: qty 90, 30d supply, fill #1

## 2022-02-17 ENCOUNTER — Ambulatory Visit: Payer: Self-pay | Admitting: Surgery

## 2022-02-17 ENCOUNTER — Other Ambulatory Visit: Payer: Self-pay | Admitting: Nurse Practitioner

## 2022-02-17 ENCOUNTER — Other Ambulatory Visit (HOSPITAL_COMMUNITY): Payer: Self-pay

## 2022-02-17 DIAGNOSIS — C189 Malignant neoplasm of colon, unspecified: Secondary | ICD-10-CM

## 2022-02-18 ENCOUNTER — Other Ambulatory Visit (HOSPITAL_COMMUNITY): Payer: Self-pay

## 2022-02-18 MED ORDER — PANTOPRAZOLE SODIUM 40 MG PO TBEC
40.0000 mg | DELAYED_RELEASE_TABLET | Freq: Every day | ORAL | 2 refills | Status: DC
  Filled 2022-02-18: qty 30, 30d supply, fill #0
  Filled 2022-04-06: qty 30, 30d supply, fill #1
  Filled 2022-04-29: qty 30, 30d supply, fill #2

## 2022-02-19 ENCOUNTER — Other Ambulatory Visit (HOSPITAL_COMMUNITY): Payer: Self-pay

## 2022-02-20 ENCOUNTER — Inpatient Hospital Stay: Payer: 59 | Attending: Oncology

## 2022-02-20 ENCOUNTER — Inpatient Hospital Stay: Payer: 59

## 2022-02-20 ENCOUNTER — Inpatient Hospital Stay: Payer: 59 | Admitting: Oncology

## 2022-02-20 VITALS — BP 127/83 | HR 94 | Temp 98.2°F | Resp 18 | Ht 75.0 in | Wt 163.0 lb

## 2022-02-20 DIAGNOSIS — Z9049 Acquired absence of other specified parts of digestive tract: Secondary | ICD-10-CM | POA: Insufficient documentation

## 2022-02-20 DIAGNOSIS — K631 Perforation of intestine (nontraumatic): Secondary | ICD-10-CM | POA: Diagnosis not present

## 2022-02-20 DIAGNOSIS — A4189 Other specified sepsis: Secondary | ICD-10-CM | POA: Insufficient documentation

## 2022-02-20 DIAGNOSIS — Z8616 Personal history of COVID-19: Secondary | ICD-10-CM | POA: Diagnosis not present

## 2022-02-20 DIAGNOSIS — L271 Localized skin eruption due to drugs and medicaments taken internally: Secondary | ICD-10-CM | POA: Insufficient documentation

## 2022-02-20 DIAGNOSIS — C189 Malignant neoplasm of colon, unspecified: Secondary | ICD-10-CM

## 2022-02-20 DIAGNOSIS — C787 Secondary malignant neoplasm of liver and intrahepatic bile duct: Secondary | ICD-10-CM | POA: Diagnosis not present

## 2022-02-20 DIAGNOSIS — Z95828 Presence of other vascular implants and grafts: Secondary | ICD-10-CM

## 2022-02-20 DIAGNOSIS — T451X5A Adverse effect of antineoplastic and immunosuppressive drugs, initial encounter: Secondary | ICD-10-CM | POA: Insufficient documentation

## 2022-02-20 DIAGNOSIS — C180 Malignant neoplasm of cecum: Secondary | ICD-10-CM | POA: Diagnosis present

## 2022-02-20 DIAGNOSIS — D509 Iron deficiency anemia, unspecified: Secondary | ICD-10-CM | POA: Insufficient documentation

## 2022-02-20 DIAGNOSIS — G620 Drug-induced polyneuropathy: Secondary | ICD-10-CM | POA: Diagnosis not present

## 2022-02-20 DIAGNOSIS — F17210 Nicotine dependence, cigarettes, uncomplicated: Secondary | ICD-10-CM | POA: Insufficient documentation

## 2022-02-20 DIAGNOSIS — Z800 Family history of malignant neoplasm of digestive organs: Secondary | ICD-10-CM | POA: Diagnosis not present

## 2022-02-20 DIAGNOSIS — E46 Unspecified protein-calorie malnutrition: Secondary | ICD-10-CM | POA: Insufficient documentation

## 2022-02-20 LAB — CBC WITH DIFFERENTIAL (CANCER CENTER ONLY)
Abs Immature Granulocytes: 0.03 10*3/uL (ref 0.00–0.07)
Basophils Absolute: 0 10*3/uL (ref 0.0–0.1)
Basophils Relative: 1 %
Eosinophils Absolute: 0.3 10*3/uL (ref 0.0–0.5)
Eosinophils Relative: 5 %
HCT: 32.7 % — ABNORMAL LOW (ref 39.0–52.0)
Hemoglobin: 10.5 g/dL — ABNORMAL LOW (ref 13.0–17.0)
Immature Granulocytes: 1 %
Lymphocytes Relative: 20 %
Lymphs Abs: 1.1 10*3/uL (ref 0.7–4.0)
MCH: 30.9 pg (ref 26.0–34.0)
MCHC: 32.1 g/dL (ref 30.0–36.0)
MCV: 96.2 fL (ref 80.0–100.0)
Monocytes Absolute: 0.5 10*3/uL (ref 0.1–1.0)
Monocytes Relative: 9 %
Neutro Abs: 3.7 10*3/uL (ref 1.7–7.7)
Neutrophils Relative %: 64 %
Platelet Count: 166 10*3/uL (ref 150–400)
RBC: 3.4 MIL/uL — ABNORMAL LOW (ref 4.22–5.81)
RDW: 16.7 % — ABNORMAL HIGH (ref 11.5–15.5)
WBC Count: 5.7 10*3/uL (ref 4.0–10.5)
nRBC: 0 % (ref 0.0–0.2)

## 2022-02-20 LAB — CMP (CANCER CENTER ONLY)
ALT: 12 U/L (ref 0–44)
AST: 18 U/L (ref 15–41)
Albumin: 4.1 g/dL (ref 3.5–5.0)
Alkaline Phosphatase: 115 U/L (ref 38–126)
Anion gap: 8 (ref 5–15)
BUN: 19 mg/dL (ref 6–20)
CO2: 24 mmol/L (ref 22–32)
Calcium: 9.1 mg/dL (ref 8.9–10.3)
Chloride: 105 mmol/L (ref 98–111)
Creatinine: 1.18 mg/dL (ref 0.61–1.24)
GFR, Estimated: >60 mL/min (ref >60)
Glucose, Bld: 116 mg/dL — ABNORMAL HIGH (ref 70–99)
Potassium: 4 mmol/L (ref 3.5–5.1)
Sodium: 137 mmol/L (ref 135–145)
Total Bilirubin: 0.6 mg/dL (ref 0.3–1.2)
Total Protein: 7.3 g/dL (ref 6.5–8.1)

## 2022-02-20 MED ORDER — HEPARIN SOD (PORK) LOCK FLUSH 100 UNIT/ML IV SOLN
500.0000 [IU] | Freq: Once | INTRAVENOUS | Status: AC
  Administered 2022-02-20: 500 [IU] via INTRAVENOUS

## 2022-02-20 MED ORDER — SODIUM CHLORIDE 0.9% FLUSH
10.0000 mL | INTRAVENOUS | Status: DC | PRN
  Administered 2022-02-20: 10 mL via INTRAVENOUS

## 2022-02-20 NOTE — Progress Notes (Signed)
?[facility] ?OFFICE PROGRESS NOTE ? ? ?Diagnosis: Colon cancer ? ?INTERVAL HISTORY:  ? ?[name] returns as scheduled.  He continues Xeloda.  No mouth sores, nausea, or diarrhea.  He continues to have dryness, hyperpigmentation, and callus formation at the hands and feet.  Right leg pain has improved. ?He is scheduled for takedown of the ileostomy and resection of liver lesions 6-23.  He has decreased cigarette use. ? ?Objective: ? ?Vital signs in last 24 hours: ? ?Blood pressure 127/83, pulse 94, temperature 98.2 ?F (36.8 ?C), temperature source Oral, resp. rate 18, height $RemoveBe'6\' 3"'seXPVfwXf$  (1.905 m), weight 163 lb (73.9 kg), SpO2 100 %. ?  ? ?HEENT: No thrush or ulcers ?Resp: Lungs clear bilaterally, no respiratory distress ?Cardio: Regular rate and rhythm ?GI: No hepatosplenomegaly, right abdomen ileostomy ?Vascular: No leg edema  ?Skin: Multiple inclusion cyst over the back, a few have overlying erythema with superficial ulceration, dryness with callus formation at the palms and soles, one 3-4 mm superficial ulcer at the left hand ? ?Portacath/PICC-without erythema ? ?Lab Results: ? ?Lab Results  ?Component Value Date  ? WBC 5.7 02/20/2022  ? HGB 10.5 (L) 02/20/2022  ? HCT 32.7 (L) 02/20/2022  ? MCV 96.2 02/20/2022  ? PLT 166 02/20/2022  ? NEUTROABS 3.7 02/20/2022  ? ? ?CMP  ?Lab Results  ?Component Value Date  ? NA 137 02/20/2022  ? K 4.0 02/20/2022  ? CL 105 02/20/2022  ? CO2 24 02/20/2022  ? GLUCOSE 116 (H) 02/20/2022  ? BUN 19 02/20/2022  ? CREATININE 1.18 02/20/2022  ? CALCIUM 9.1 02/20/2022  ? PROT 7.3 02/20/2022  ? ALBUMIN 4.1 02/20/2022  ? AST 18 02/20/2022  ? ALT 12 02/20/2022  ? ALKPHOS 115 02/20/2022  ? BILITOT 0.6 02/20/2022  ? GFRNONAA >60 02/20/2022  ? ? ?Lab Results  ?Component Value Date  ? CEA1 0.8 11/19/2020  ? CEA 2.47 06/09/2021  ? ? ? ?Medications: I have reviewed the patient's current medications. ? ? ?Assessment/Plan: ?Stage IV adenocarcinoma of the colon ?-11/15/2020 CT abdomen/pelvis  with contrast-long segment masslike mural thickening of the cecum, posterior perforation at the site of the cecal wall thickening with multilocular right retroperitoneal abscess, indeterminate hypodensity in the inferior right lobe of the liver. ?-11/19/2020 CEA 0.8 ?-11/20/2020 MRI of the abdomen and pelvis with and without contrast-3.5 x 2.5 cm heterogeneous enhancing lesion in the inferior peripheral right liver with 2 smaller lesions with similar features. ?-11/20/2020 ultrasound-guided liver biopsy consistent with adenocarcinoma.  Foundation 1-MSS, tumor mutation burden 8,KRAS wt ?-11/25/2020 CT abdomen/pelvis with contrast-interval placement of a right lower quadrant percutaneous pigtail drainage catheter with near resolution of the retroperitoneal fluid collection, redemonstrated large fungating mass at the cecal base measuring 11.9 x 9.1 x 8.4 cm with enlarged lymph nodes in the right lower quadrant mesocolon. ?-11/28/2020 exploratory laparotomy, right colectomy, ileostomy creation, cystoscopy with stent placement ?            -Surgical pathology showed adenocarcinoma, moderately differentiated, 12 cm, 0/14+ lymph nodes, lymphovascular space invasion present, radial margin involved by invasive adenocarcinoma, appendix with intraluminal             adenocarcinoma, pT4, PN 0 ?-12/09/2020 CT abdomen/pelvis with contrast-interval right colectomy and right lower quadrant ileostomy, stable right retroperitoneal soft tissue density along the psoas and iliacus muscles, mild progression of liver metastases since prior study. ?-Cycle 1 FOLFOX 12/24/2020 ?-Cycle 2 FOLFOX 01/06/2021 ?-Cycle 3 FOLFOX 01/20/2021 ?-Cycle 4 FOLFOX 02/03/2021 ?-Cycle 5 FOLFOX 02/17/2021 ?-Cycle 6  FOLFOX 03/03/2021 ?-Restaging CTs 03/05/2021- substantial interval decrease in size of predominantly hypodense lesions throughout the liver.  Significant interval resolution of previously seen extensive, heterogeneous soft tissue in the right retroperitoneal soft  tissues and paracolic gutter overlying the psoas and iliacus muscles. ?-Cycle 7 FOLFOX 03/19/2021 ?-Cycle 8 FOLFOX 04/03/2021, oxaliplatin dose reduced secondary to neutropenia and thrombocytopenia ?-Cycle 9 FOLFOX 04/15/2021 ?-Cycle 10 FOLFOX 04/29/2021 ?-Cycle 11 FOLFOX 05/13/2021, oxaliplatin held secondary to neuropathy ?-Cycle 12 FOLFOX 05/27/2021, oxaliplatin held secondary to neuropathy ?-CT abdomen/pelvis 06/09/2021-decrease size of remaining dominant liver lesion, other liver lesions are calcified and punctate, subtle density in the posterior right bladder, unchanged sclerotic right iliac lesion ?-Cycle 13 FOLFOX 06/10/2021, oxaliplatin held secondary to neuropathy ?-Maintenance Xeloda 06/25/2021 ?-MRI liver 07/03/2021-no significant change in subtle irregular nonenhancing remnant liver lesions of the inferior right lobe of the liver measuring 1.2 x 0.6 cm and adjacent to the gallbladder fossa measuring no greater than 0.5 cm. ?-Maintenance Xeloda continued ?-Tumor conference 08/06/2021-rescan at a 74-month interval ?-Xeloda decreased to 1000 mg a.m., 500 mg p.m. 08/05/2021 (due to hand-foot syndrome) ?-MRI liver 10/31/2021-treated liver lesion within the inferior right hepatic lobe measuring 0.9 cm.  Small treated lesion within the posterior medial right hepatic lobe measuring 5 mm, unchanged. ?-Maintenance Xeloda continued ?-Xeloda discontinued 02/20/2022 ?  ?2.  Sepsis secondary to perforated colon ?3.  Retroperitoneal abscess ?4.  Protein calorie malnutrition ?5.  Thrombocytosis ?6.  Iron deficiency anemia ?7.  COVID-19 infection, 11/15/2020 ?8.  Tobacco abuse ?9.  Family history of colon cancer ?10.  Port-A-Cath placement, Dr. 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  Admission 09/22/2021 with dehydration and probable COPD flare ?  ? ? ?Disposition: ?[name] appears stable.  He is scheduled for ileostomy takedown and resection of  liver metastases by Dr. [name] on 6-23.  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  He will discontinue Xeloda after today's dose. ? ?[name] will be scheduled for an office visit during the week of 03/30/2022.  I encouraged him to continue to work on discontinuing smoking. ? ?[name], MD ? ?02/20/2022  ?12:42 PM ? ? ?

## 2022-02-20 NOTE — Patient Instructions (Signed)

## 2022-02-21 ENCOUNTER — Other Ambulatory Visit (HOSPITAL_COMMUNITY): Payer: Self-pay

## 2022-02-23 ENCOUNTER — Other Ambulatory Visit (HOSPITAL_COMMUNITY): Payer: Self-pay

## 2022-02-23 ENCOUNTER — Other Ambulatory Visit: Payer: Self-pay | Admitting: Oncology

## 2022-02-23 DIAGNOSIS — C189 Malignant neoplasm of colon, unspecified: Secondary | ICD-10-CM

## 2022-02-23 NOTE — Telephone Encounter (Signed)
Pharmacy notified that capecitabine has been stopped. No further action needed at this time . ? ?Darl Pikes, PharmD, BCPS, BCOP, CPP ?Hematology/Oncology Clinical Pharmacist Practitioner ?[facility] ?[phone] ? ?02/23/2022 2:56 PM ? ?

## 2022-03-11 ENCOUNTER — Other Ambulatory Visit: Payer: Self-pay

## 2022-03-11 ENCOUNTER — Encounter (HOSPITAL_COMMUNITY): Payer: Self-pay | Admitting: Surgery

## 2022-03-11 NOTE — Progress Notes (Signed)
PCP - Dr. Milderd Meager- [place]  Cardiologist - Denies  EP- Denies  Endocrine- Denies  Pulm- Dr. Evette Doffing  Chest x-ray - 09/22/21 (E)  EKG - 09/22/21 (E)  Stress Test - Denies  ECHO - 09/23/21 (E)  Cardiac Cath - Denies  AICD-na PM-na LOOP-na  Nerve Stimulator- Denies  Dialysis- Denies  Sleep Study - Denies CPAP - Denies  LABS- 02/20/22(E): CBC w/D, CMP  ASA- Denies  ERAS-Yes- 3 Ensure drinks to be taken by 0800 in 6/2  HA1C- Denies  Anesthesia- No  Pt denies having chest pain, sob, or fever during the pre-op phone call. All instructions explained to the pt, with a verbal understanding of the material including: as of today,  stop taking all Aspirin (unless instructed by your doctor) and Other Aspirin containing products, Vitamins, Fish oils, and Herbal medications. Also stop all NSAIDS i.e. Advil, Ibuprofen, Motrin, Aleve, Anaprox, Naproxen, BC, Goody Powders, and all Supplements.  Pt also instructed to wear a mask and social distance if he goes out. The opportunity to ask questions was provided.

## 2022-03-11 NOTE — Progress Notes (Addendum)
Pt to pick up 3 Pre- Surgery Ensure drinks on tomorrow 6/1 for his surgery prep on 6/2. Two drinks to be taken by 10pm on 6/1, and 1 bottle to be taken by 8am on 6/2. Bottles will be left at the PAT front desk for pick-up.

## 2022-03-12 ENCOUNTER — Ambulatory Visit: Payer: Self-pay | Admitting: Surgery

## 2022-03-12 NOTE — H&P (View-Only) (Signed)
History of Present Illness: Eric [name] is a 51 y.o. male who is seen today for his preoperative visit prior to surgery tomorrow. Since his last visit, he underwent a colonoscopy on 4/24 by Dr. Havery [name]. 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 He also saw a pulmonologist and had PFTs. He was stratified as intermediate risk for surgery (13% risk of pulmonary complications). The patient has been extensively counseled on smoking cessation and has significantly cut back on cigarette use. He still vapes but says he has smoked about 20 cigarettes total in the last 3 weeks and has not smoked at all in the last 2 days. He recently saw Dr. Benay [name] on 5/12 and his Xeloda was discontinued.   He has no acute complaints today and feels ready for surgery. He is eating without difficulty and his ostomy is functioning well.     Review of Systems: A complete review of systems was obtained from the patient.  I have reviewed this information and discussed as appropriate with the patient.  See HPI as well for other ROS.   Medical History: Past Medical History: Diagnosis Date  COPD (chronic obstructive pulmonary disease) (CMS-HCC)   History of cancer    Patient Active Problem List Diagnosis  Colon cancer metastasized to liver (CMS-HCC)   Past Surgical History: Procedure Laterality Date  Right hemicolectomy      No Known Allergies  Current Outpatient Medications on File Prior to Visit Medication Sig Dispense Refill  albuterol 90 mcg/actuation inhaler Inhale into the lungs    calcium polycarbophiL (FIBERCON) 625 mg tablet Take by mouth    diazePAM (VALIUM) 5 MG tablet Take 1/2 tablet by mouth 30 - 60 minutes  prior to MRI --bring other 1/2 tablet to appointment.    diphenoxylate-atropine (LOMOTIL) 2.5-0.025 mg tablet Take by mouth    fluticasone furoate-vilanteroL (BREO ELLIPTA) 100-25 mcg/dose DsDv inhaler Inhale 1 inhalation into the  lungs once daily    gabapentin (NEURONTIN) 300 MG capsule Take 1 capsule by mouth 3 (three) times daily    ondansetron (ZOFRAN) 8 MG tablet Take by mouth    prochlorperazine (COMPAZINE) 10 MG tablet Take 10 mg by mouth every 6 (six) hours as needed    No current facility-administered medications on file prior to visit.   Family History Problem Relation Age of Onset  Stroke Mother   High blood pressure (Hypertension) Mother   Hyperlipidemia (Elevated cholesterol) Mother   Coronary Artery Disease (Blocked arteries around heart) Father   High blood pressure (Hypertension) Sister   Hyperlipidemia (Elevated cholesterol) Sister   Diabetes Sister     Social History  Tobacco Use Smoking Status Every Day  Types: Cigarettes Smokeless Tobacco Current    Social History  Socioeconomic History  Marital status: Single Tobacco Use  Smoking status: Every Day   Types: Cigarettes  Smokeless tobacco: Current Substance and Sexual Activity  Alcohol use: Never  Drug use: Yes   Types: Marijuana   Objective:   Vitals:  03/12/22 1526 Pulse: 94 Temp: 36.9 C (98.4 F) SpO2: 98% Weight: 74.2 kg (163 lb 9.6 oz) Height: 190.5 cm ('6\' 3"'$ )   Body mass index is 20.45 kg/m.  Physical Exam Vitals reviewed.  Constitutional:      General: He is not in acute distress.    Appearance: Normal appearance.  HENT:     Head: Normocephalic and atraumatic.  Eyes:     General: No scleral icterus.    Conjunctiva/sclera: Conjunctivae  normal.  Cardiovascular:     Rate and Rhythm: Normal rate and regular rhythm.     Heart sounds: No murmur heard. Pulmonary:     Effort: Pulmonary effort is normal. No respiratory distress.     Breath sounds: Normal breath sounds.  Abdominal:     General: There is no distension.     Palpations: Abdomen is soft.     Tenderness: There is no abdominal tenderness.  Musculoskeletal:     Cervical back: Normal range of motion.  Neurological:     Mental Status: He is  alert.          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 He has completed his preoperative workup and is ready for surgery tomorrow. I reviewed the surgical plan with him. We will begin with liver assessment and resection of the liver lesions.  If he remains hemodynamically stable after liver resection and there is not excessive blood loss, we will then proceed with ileostomy reversal.  He is aware that ostomy reversal may not be safe if he is unstable or having any respiratory issues after the liver resection.  He also understands that if extrahepatic disease is identified, liver resection will not be performed.  He has fortunately significantly decreased his cigarette smoking, and I encouraged him to try to quit completely.  He has already received his preop instructions and is aware of when to arrive for surgery tomorrow morning.  Eric Birks, MD [facility] Surgery General, Hepatobiliary and Pancreatic Surgery 03/12/22 5:12 PM

## 2022-03-12 NOTE — H&P (Signed)
History of Present Illness: Eric [name] is a 51 y.o. male who is seen today for his preoperative visit prior to surgery tomorrow. Since his last visit, he underwent a colonoscopy on 4/24 by Dr. Havery [name]. 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 He also saw a pulmonologist and had PFTs. He was stratified as intermediate risk for surgery (13% risk of pulmonary complications). The patient has been extensively counseled on smoking cessation and has significantly cut back on cigarette use. He still vapes but says he has smoked about 20 cigarettes total in the last 3 weeks and has not smoked at all in the last 2 days. He recently saw Dr. Benay [name] on 5/12 and his Xeloda was discontinued.   He has no acute complaints today and feels ready for surgery. He is eating without difficulty and his ostomy is functioning well.     Review of Systems: A complete review of systems was obtained from the patient.  I have reviewed this information and discussed as appropriate with the patient.  See HPI as well for other ROS.   Medical History: Past Medical History: Diagnosis Date  COPD (chronic obstructive pulmonary disease) (CMS-HCC)   History of cancer    Patient Active Problem List Diagnosis  Colon cancer metastasized to liver (CMS-HCC)   Past Surgical History: Procedure Laterality Date  Right hemicolectomy      No Known Allergies  Current Outpatient Medications on File Prior to Visit Medication Sig Dispense Refill  albuterol 90 mcg/actuation inhaler Inhale into the lungs    calcium polycarbophiL (FIBERCON) 625 mg tablet Take by mouth    diazePAM (VALIUM) 5 MG tablet Take 1/2 tablet by mouth 30 - 60 minutes  prior to MRI --bring other 1/2 tablet to appointment.    diphenoxylate-atropine (LOMOTIL) 2.5-0.025 mg tablet Take by mouth    fluticasone furoate-vilanteroL (BREO ELLIPTA) 100-25 mcg/dose DsDv inhaler Inhale 1 inhalation into the  lungs once daily    gabapentin (NEURONTIN) 300 MG capsule Take 1 capsule by mouth 3 (three) times daily    ondansetron (ZOFRAN) 8 MG tablet Take by mouth    prochlorperazine (COMPAZINE) 10 MG tablet Take 10 mg by mouth every 6 (six) hours as needed    No current facility-administered medications on file prior to visit.   Family History Problem Relation Age of Onset  Stroke Mother   High blood pressure (Hypertension) Mother   Hyperlipidemia (Elevated cholesterol) Mother   Coronary Artery Disease (Blocked arteries around heart) Father   High blood pressure (Hypertension) Sister   Hyperlipidemia (Elevated cholesterol) Sister   Diabetes Sister     Social History  Tobacco Use Smoking Status Every Day  Types: Cigarettes Smokeless Tobacco Current    Social History  Socioeconomic History  Marital status: Single Tobacco Use  Smoking status: Every Day   Types: Cigarettes  Smokeless tobacco: Current Substance and Sexual Activity  Alcohol use: Never  Drug use: Yes   Types: Marijuana   Objective:   Vitals:  03/12/22 1526 Pulse: 94 Temp: 36.9 C (98.4 F) SpO2: 98% Weight: 74.2 kg (163 lb 9.6 oz) Height: 190.5 cm ('6\' 3"'$ )   Body mass index is 20.45 kg/m.  Physical Exam Vitals reviewed.  Constitutional:      General: He is not in acute distress.    Appearance: Normal appearance.  HENT:     Head: Normocephalic and atraumatic.  Eyes:     General: No scleral icterus.    Conjunctiva/sclera: Conjunctivae  normal.  Cardiovascular:     Rate and Rhythm: Normal rate and regular rhythm.     Heart sounds: No murmur heard. Pulmonary:     Effort: Pulmonary effort is normal. No respiratory distress.     Breath sounds: Normal breath sounds.  Abdominal:     General: There is no distension.     Palpations: Abdomen is soft.     Tenderness: There is no abdominal tenderness.  Musculoskeletal:     Cervical back: Normal range of motion.  Neurological:     Mental Status: He is  alert.          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 He has completed his preoperative workup and is ready for surgery tomorrow. I reviewed the surgical plan with him. We will begin with liver assessment and resection of the liver lesions.  If he remains hemodynamically stable after liver resection and there is not excessive blood loss, we will then proceed with ileostomy reversal.  He is aware that ostomy reversal may not be safe if he is unstable or having any respiratory issues after the liver resection.  He also understands that if extrahepatic disease is identified, liver resection will not be performed.  He has fortunately significantly decreased his cigarette smoking, and I encouraged him to try to quit completely.  He has already received his preop instructions and is aware of when to arrive for surgery tomorrow morning.  Eric Birks, MD [facility] Surgery General, Hepatobiliary and Pancreatic Surgery 03/12/22 5:12 PM

## 2022-03-13 ENCOUNTER — Other Ambulatory Visit: Payer: Self-pay

## 2022-03-13 ENCOUNTER — Encounter (HOSPITAL_COMMUNITY)
Admission: RE | Disposition: A | Discharge status: Home Health | Payer: Self-pay | Source: Home / Self Care | Attending: Surgery

## 2022-03-13 ENCOUNTER — Encounter (HOSPITAL_COMMUNITY): Payer: Self-pay | Admitting: Surgery

## 2022-03-13 ENCOUNTER — Inpatient Hospital Stay (HOSPITAL_COMMUNITY)
Admission: RE | Admit: 2022-03-13 | Discharge: 2022-04-05 | DRG: 329 | Disposition: A | Discharge status: Home Health | Payer: 59 | Attending: Surgery | Admitting: Surgery

## 2022-03-13 ENCOUNTER — Inpatient Hospital Stay (HOSPITAL_COMMUNITY): Payer: 59 | Admitting: Anesthesiology

## 2022-03-13 DIAGNOSIS — J449 Chronic obstructive pulmonary disease, unspecified: Secondary | ICD-10-CM

## 2022-03-13 DIAGNOSIS — D62 Acute posthemorrhagic anemia: Secondary | ICD-10-CM | POA: Diagnosis not present

## 2022-03-13 DIAGNOSIS — R0902 Hypoxemia: Secondary | ICD-10-CM | POA: Diagnosis not present

## 2022-03-13 DIAGNOSIS — D630 Anemia in neoplastic disease: Secondary | ICD-10-CM | POA: Diagnosis not present

## 2022-03-13 DIAGNOSIS — E871 Hypo-osmolality and hyponatremia: Secondary | ICD-10-CM | POA: Diagnosis not present

## 2022-03-13 DIAGNOSIS — K811 Chronic cholecystitis: Secondary | ICD-10-CM | POA: Diagnosis present

## 2022-03-13 DIAGNOSIS — C787 Secondary malignant neoplasm of liver and intrahepatic bile duct: Secondary | ICD-10-CM

## 2022-03-13 DIAGNOSIS — J9811 Atelectasis: Secondary | ICD-10-CM | POA: Diagnosis not present

## 2022-03-13 DIAGNOSIS — Z7951 Long term (current) use of inhaled steroids: Secondary | ICD-10-CM

## 2022-03-13 DIAGNOSIS — Z85038 Personal history of other malignant neoplasm of large intestine: Secondary | ICD-10-CM | POA: Diagnosis not present

## 2022-03-13 DIAGNOSIS — Z9981 Dependence on supplemental oxygen: Secondary | ICD-10-CM

## 2022-03-13 DIAGNOSIS — D649 Anemia, unspecified: Secondary | ICD-10-CM | POA: Diagnosis not present

## 2022-03-13 DIAGNOSIS — E869 Volume depletion, unspecified: Secondary | ICD-10-CM | POA: Diagnosis not present

## 2022-03-13 DIAGNOSIS — F17210 Nicotine dependence, cigarettes, uncomplicated: Secondary | ICD-10-CM | POA: Diagnosis present

## 2022-03-13 DIAGNOSIS — J90 Pleural effusion, not elsewhere classified: Secondary | ICD-10-CM | POA: Diagnosis present

## 2022-03-13 DIAGNOSIS — C189 Malignant neoplasm of colon, unspecified: Secondary | ICD-10-CM

## 2022-03-13 DIAGNOSIS — T8131XA Disruption of external operation (surgical) wound, not elsewhere classified, initial encounter: Secondary | ICD-10-CM | POA: Diagnosis not present

## 2022-03-13 DIAGNOSIS — Z79899 Other long term (current) drug therapy: Secondary | ICD-10-CM | POA: Diagnosis not present

## 2022-03-13 DIAGNOSIS — Z9221 Personal history of antineoplastic chemotherapy: Secondary | ICD-10-CM

## 2022-03-13 DIAGNOSIS — Z9049 Acquired absence of other specified parts of digestive tract: Secondary | ICD-10-CM | POA: Diagnosis not present

## 2022-03-13 DIAGNOSIS — R509 Fever, unspecified: Secondary | ICD-10-CM | POA: Diagnosis not present

## 2022-03-13 DIAGNOSIS — Z8616 Personal history of COVID-19: Secondary | ICD-10-CM

## 2022-03-13 DIAGNOSIS — Z6821 Body mass index (BMI) 21.0-21.9, adult: Secondary | ICD-10-CM

## 2022-03-13 DIAGNOSIS — Z800 Family history of malignant neoplasm of digestive organs: Secondary | ICD-10-CM | POA: Diagnosis not present

## 2022-03-13 DIAGNOSIS — B952 Enterococcus as the cause of diseases classified elsewhere: Secondary | ICD-10-CM | POA: Diagnosis not present

## 2022-03-13 DIAGNOSIS — K9189 Other postprocedural complications and disorders of digestive system: Secondary | ICD-10-CM | POA: Diagnosis not present

## 2022-03-13 DIAGNOSIS — K567 Ileus, unspecified: Secondary | ICD-10-CM | POA: Diagnosis not present

## 2022-03-13 DIAGNOSIS — T8132XA Disruption of internal operation (surgical) wound, not elsewhere classified, initial encounter: Secondary | ICD-10-CM | POA: Diagnosis not present

## 2022-03-13 DIAGNOSIS — T80212A Local infection due to central venous catheter, initial encounter: Secondary | ICD-10-CM | POA: Diagnosis not present

## 2022-03-13 DIAGNOSIS — E43 Unspecified severe protein-calorie malnutrition: Secondary | ICD-10-CM | POA: Diagnosis present

## 2022-03-13 DIAGNOSIS — R7881 Bacteremia: Secondary | ICD-10-CM | POA: Diagnosis not present

## 2022-03-13 DIAGNOSIS — Z932 Ileostomy status: Secondary | ICD-10-CM | POA: Diagnosis not present

## 2022-03-13 HISTORY — PX: LIVER ULTRASOUND: SHX5952

## 2022-03-13 HISTORY — DX: Dyspnea, unspecified: R06.00

## 2022-03-13 HISTORY — PX: ILEOSTOMY CLOSURE: SHX1784

## 2022-03-13 HISTORY — DX: Malignant (primary) neoplasm, unspecified: C80.1

## 2022-03-13 HISTORY — PX: CHOLECYSTECTOMY: SHX55

## 2022-03-13 HISTORY — PX: OPEN PARTIAL HEPATECTOMY [83]: SHX5987

## 2022-03-13 LAB — COMPREHENSIVE METABOLIC PANEL
ALT: 190 U/L — ABNORMAL HIGH (ref 0–44)
AST: 285 U/L — ABNORMAL HIGH (ref 15–41)
Albumin: 3.5 g/dL (ref 3.5–5.0)
Alkaline Phosphatase: 123 U/L (ref 38–126)
Anion gap: 12 (ref 5–15)
BUN: 13 mg/dL (ref 6–20)
CO2: 23 mmol/L (ref 22–32)
Calcium: 9 mg/dL (ref 8.9–10.3)
Chloride: 104 mmol/L (ref 98–111)
Creatinine, Ser: 1.1 mg/dL (ref 0.61–1.24)
GFR, Estimated: >60 mL/min (ref >60)
Glucose, Bld: 180 mg/dL — ABNORMAL HIGH (ref 70–99)
Potassium: 4.6 mmol/L (ref 3.5–5.1)
Sodium: 139 mmol/L (ref 135–145)
Total Bilirubin: 1.2 mg/dL (ref 0.3–1.2)
Total Protein: 6.5 g/dL (ref 6.5–8.1)

## 2022-03-13 LAB — CBC
HCT: 35.2 % — ABNORMAL LOW (ref 39.0–52.0)
HCT: 34.5 % — ABNORMAL LOW (ref 39.0–52.0)
Hemoglobin: 11.3 g/dL — ABNORMAL LOW (ref 13.0–17.0)
Hemoglobin: 11.6 g/dL — ABNORMAL LOW (ref 13.0–17.0)
MCH: 30.5 pg (ref 26.0–34.0)
MCH: 30.8 pg (ref 26.0–34.0)
MCHC: 32.1 g/dL (ref 30.0–36.0)
MCHC: 33.6 g/dL (ref 30.0–36.0)
MCV: 95.1 fL (ref 80.0–100.0)
MCV: 91.5 fL (ref 80.0–100.0)
Platelets: 154 10*3/uL (ref 150–400)
Platelets: 284 10*3/uL (ref 150–400)
RBC: 3.7 MIL/uL — ABNORMAL LOW (ref 4.22–5.81)
RBC: 3.77 MIL/uL — ABNORMAL LOW (ref 4.22–5.81)
RDW: 15.2 % (ref 11.5–15.5)
RDW: 15.2 % (ref 11.5–15.5)
WBC: 7.1 10*3/uL (ref 4.0–10.5)
WBC: 25.6 10*3/uL — ABNORMAL HIGH (ref 4.0–10.5)
nRBC: 0 % (ref 0.0–0.2)
nRBC: 0 % (ref 0.0–0.2)

## 2022-03-13 LAB — HEMOGLOBIN A1C
Hgb A1c MFr Bld: 5.8 % — ABNORMAL HIGH (ref 4.8–5.6)
Mean Plasma Glucose: 119.76 mg/dL

## 2022-03-13 LAB — PREPARE RBC (CROSSMATCH)

## 2022-03-13 SURGERY — REVISION, ILEOSTOMY
Anesthesia: General | Site: Abdomen

## 2022-03-13 MED ORDER — ROCURONIUM BROMIDE 10 MG/ML (PF) SYRINGE
PREFILLED_SYRINGE | INTRAVENOUS | Status: DC | PRN
  Administered 2022-03-13: 20 mg via INTRAVENOUS
  Administered 2022-03-13 (×2): 30 mg via INTRAVENOUS
  Administered 2022-03-13: 70 mg via INTRAVENOUS

## 2022-03-13 MED ORDER — LIDOCAINE 2% (20 MG/ML) 5 ML SYRINGE
INTRAMUSCULAR | Status: DC | PRN
  Administered 2022-03-13: 40 mg via INTRAVENOUS
  Administered 2022-03-13: 60 mg via INTRAVENOUS

## 2022-03-13 MED ORDER — CHLORHEXIDINE GLUCONATE 4 % EX LIQD
60.0000 mL | Freq: Once | CUTANEOUS | Status: DC
Start: 2022-03-13 — End: 2022-03-13

## 2022-03-13 MED ORDER — FENTANYL CITRATE (PF) 250 MCG/5ML IJ SOLN
INTRAMUSCULAR | Status: AC
  Filled 2022-03-13: qty 5

## 2022-03-13 MED ORDER — CHLORHEXIDINE GLUCONATE 4 % EX LIQD
60.0000 mL | Freq: Once | CUTANEOUS | Status: DC
Start: 2022-03-14 — End: 2022-03-13

## 2022-03-13 MED ORDER — PHENYLEPHRINE 80 MCG/ML (10ML) SYRINGE FOR IV PUSH (FOR BLOOD PRESSURE SUPPORT)
PREFILLED_SYRINGE | INTRAVENOUS | Status: DC | PRN
  Administered 2022-03-13 (×2): 80 ug via INTRAVENOUS

## 2022-03-13 MED ORDER — KETAMINE HCL 10 MG/ML IJ SOLN
INTRAMUSCULAR | Status: DC | PRN
  Administered 2022-03-13 (×2): 10 mg via INTRAVENOUS
  Administered 2022-03-13: 30 mg via INTRAVENOUS

## 2022-03-13 MED ORDER — HYDROMORPHONE HCL 1 MG/ML IJ SOLN
INTRAMUSCULAR | Status: AC
  Filled 2022-03-13: qty 1

## 2022-03-13 MED ORDER — 0.9 % SODIUM CHLORIDE (POUR BTL) OPTIME
TOPICAL | Status: DC | PRN
  Administered 2022-03-13 (×4): 1000 mL

## 2022-03-13 MED ORDER — CHLORHEXIDINE GLUCONATE 0.12 % MT SOLN
OROMUCOSAL | Status: AC
  Administered 2022-03-13: 15 mL via OROMUCOSAL
  Filled 2022-03-13: qty 15

## 2022-03-13 MED ORDER — PROPOFOL 10 MG/ML IV BOLUS
INTRAVENOUS | Status: AC
  Filled 2022-03-13: qty 20

## 2022-03-13 MED ORDER — KETAMINE HCL 50 MG/5ML IJ SOSY
PREFILLED_SYRINGE | INTRAMUSCULAR | Status: AC
  Filled 2022-03-13: qty 5

## 2022-03-13 MED ORDER — PANTOPRAZOLE SODIUM 40 MG PO TBEC
40.0000 mg | DELAYED_RELEASE_TABLET | Freq: Every day | ORAL | Status: DC
  Administered 2022-03-14 – 2022-03-18 (×5): 40 mg via ORAL
  Filled 2022-03-13 (×5): qty 1

## 2022-03-13 MED ORDER — ROCURONIUM BROMIDE 10 MG/ML (PF) SYRINGE
PREFILLED_SYRINGE | INTRAVENOUS | Status: AC
  Filled 2022-03-13: qty 20

## 2022-03-13 MED ORDER — MIDAZOLAM HCL 2 MG/2ML IJ SOLN
INTRAMUSCULAR | Status: AC
  Filled 2022-03-13: qty 2

## 2022-03-13 MED ORDER — PHENYLEPHRINE 80 MCG/ML (10ML) SYRINGE FOR IV PUSH (FOR BLOOD PRESSURE SUPPORT)
PREFILLED_SYRINGE | INTRAVENOUS | Status: AC
  Filled 2022-03-13: qty 20

## 2022-03-13 MED ORDER — FENTANYL CITRATE (PF) 250 MCG/5ML IJ SOLN
INTRAMUSCULAR | Status: DC | PRN
Start: 2022-03-13 — End: 2022-03-13
  Administered 2022-03-13: 50 ug via INTRAVENOUS
  Administered 2022-03-13: 100 ug via INTRAVENOUS
  Administered 2022-03-13: 50 ug via INTRAVENOUS
  Administered 2022-03-13 (×2): 100 ug via INTRAVENOUS
  Administered 2022-03-13: 150 ug via INTRAVENOUS
  Administered 2022-03-13: 50 ug via INTRAVENOUS

## 2022-03-13 MED ORDER — HEMOSTATIC AGENTS (NO CHARGE) OPTIME
TOPICAL | Status: DC | PRN
  Administered 2022-03-13: 1 via TOPICAL

## 2022-03-13 MED ORDER — METHOCARBAMOL 500 MG PO TABS
1000.0000 mg | ORAL_TABLET | Freq: Three times a day (TID) | ORAL | Status: DC
  Administered 2022-03-13 – 2022-03-18 (×15): 1000 mg via ORAL
  Filled 2022-03-13 (×15): qty 2

## 2022-03-13 MED ORDER — ENOXAPARIN SODIUM 40 MG/0.4ML IJ SOSY
40.0000 mg | PREFILLED_SYRINGE | INTRAMUSCULAR | Status: DC
  Administered 2022-03-14 – 2022-04-05 (×22): 40 mg via SUBCUTANEOUS
  Filled 2022-03-13 (×22): qty 0.4

## 2022-03-13 MED ORDER — LIDOCAINE 2% (20 MG/ML) 5 ML SYRINGE
INTRAMUSCULAR | Status: AC
  Filled 2022-03-13: qty 5

## 2022-03-13 MED ORDER — LACTATED RINGERS IV SOLN: INTRAVENOUS | Status: DC | PRN

## 2022-03-13 MED ORDER — ENSURE SURGERY PO LIQD
237.0000 mL | Freq: Two times a day (BID) | ORAL | Status: DC
  Administered 2022-03-14 – 2022-03-17 (×6): 237 mL via ORAL
  Filled 2022-03-13 (×10): qty 237

## 2022-03-13 MED ORDER — GABAPENTIN 300 MG PO CAPS
300.0000 mg | ORAL_CAPSULE | Freq: Three times a day (TID) | ORAL | Status: DC
  Administered 2022-03-13 – 2022-03-18 (×15): 300 mg via ORAL
  Filled 2022-03-13 (×15): qty 1

## 2022-03-13 MED ORDER — PHENYLEPHRINE HCL-NACL 20-0.9 MG/250ML-% IV SOLN
INTRAVENOUS | Status: DC | PRN
  Administered 2022-03-13: 25 ug/min via INTRAVENOUS

## 2022-03-13 MED ORDER — OXYCODONE HCL 5 MG PO TABS
5.0000 mg | ORAL_TABLET | ORAL | Status: DC | PRN
  Administered 2022-03-14 – 2022-03-18 (×11): 10 mg via ORAL
  Filled 2022-03-13 (×11): qty 2

## 2022-03-13 MED ORDER — MEPERIDINE HCL 25 MG/ML IJ SOLN: 6.2500 mg | INTRAMUSCULAR | Status: DC | PRN

## 2022-03-13 MED ORDER — LIDOCAINE 2% (20 MG/ML) 5 ML SYRINGE
INTRAMUSCULAR | Status: AC
  Filled 2022-03-13: qty 10

## 2022-03-13 MED ORDER — ONDANSETRON HCL 4 MG/2ML IJ SOLN
4.0000 mg | Freq: Four times a day (QID) | INTRAMUSCULAR | Status: DC | PRN
  Administered 2022-03-14 – 2022-03-27 (×13): 4 mg via INTRAVENOUS
  Filled 2022-03-13 (×14): qty 2

## 2022-03-13 MED ORDER — ORAL CARE MOUTH RINSE: 15.0000 mL | Freq: Once | OROMUCOSAL | Status: AC

## 2022-03-13 MED ORDER — SODIUM CHLORIDE 0.9 % IR SOLN
Status: DC | PRN
  Administered 2022-03-13: 1000 mL

## 2022-03-13 MED ORDER — LACTATED RINGERS IV SOLN: INTRAVENOUS | Status: DC

## 2022-03-13 MED ORDER — SODIUM CHLORIDE 0.9 % IV SOLN
2.0000 g | INTRAVENOUS | Status: AC
  Administered 2022-03-13: 2 g via INTRAVENOUS
  Filled 2022-03-13: qty 2

## 2022-03-13 MED ORDER — DIPHENHYDRAMINE HCL 50 MG/ML IJ SOLN
25.0000 mg | Freq: Four times a day (QID) | INTRAMUSCULAR | Status: DC | PRN
  Administered 2022-03-19 – 2022-03-26 (×5): 25 mg via INTRAVENOUS
  Filled 2022-03-13 (×5): qty 1

## 2022-03-13 MED ORDER — SUGAMMADEX SODIUM 200 MG/2ML IV SOLN
INTRAVENOUS | Status: DC | PRN
  Administered 2022-03-13: 200 mg via INTRAVENOUS

## 2022-03-13 MED ORDER — ROCURONIUM BROMIDE 10 MG/ML (PF) SYRINGE
PREFILLED_SYRINGE | INTRAVENOUS | Status: AC
  Filled 2022-03-13: qty 10

## 2022-03-13 MED ORDER — ONDANSETRON HCL 4 MG/2ML IJ SOLN
INTRAMUSCULAR | Status: AC
  Filled 2022-03-13: qty 4

## 2022-03-13 MED ORDER — MIDAZOLAM HCL 2 MG/2ML IJ SOLN
INTRAMUSCULAR | Status: DC | PRN
  Administered 2022-03-13: 2 mg via INTRAVENOUS

## 2022-03-13 MED ORDER — PROPOFOL 10 MG/ML IV BOLUS
INTRAVENOUS | Status: DC | PRN
  Administered 2022-03-13: 140 mg via INTRAVENOUS

## 2022-03-13 MED ORDER — CHLORHEXIDINE GLUCONATE 0.12 % MT SOLN: 15.0000 mL | Freq: Once | OROMUCOSAL | Status: AC

## 2022-03-13 MED ORDER — ALBUMIN HUMAN 5 % IV SOLN: INTRAVENOUS | Status: DC | PRN

## 2022-03-13 MED ORDER — HYDROMORPHONE HCL 1 MG/ML IJ SOLN
0.2500 mg | INTRAMUSCULAR | Status: DC | PRN
  Administered 2022-03-13 (×4): 0.5 mg via INTRAVENOUS

## 2022-03-13 MED ORDER — DEXAMETHASONE SODIUM PHOSPHATE 10 MG/ML IJ SOLN
INTRAMUSCULAR | Status: AC
  Filled 2022-03-13: qty 2

## 2022-03-13 MED ORDER — AMISULPRIDE (ANTIEMETIC) 5 MG/2ML IV SOLN
10.0000 mg | Freq: Once | INTRAVENOUS | Status: DC | PRN
Start: 2022-03-13 — End: 2022-03-13

## 2022-03-13 MED ORDER — ACETAMINOPHEN 500 MG PO TABS
1000.0000 mg | ORAL_TABLET | Freq: Three times a day (TID) | ORAL | Status: DC
  Administered 2022-03-13 – 2022-03-18 (×14): 1000 mg via ORAL
  Filled 2022-03-13 (×15): qty 2

## 2022-03-13 MED ORDER — ONDANSETRON HCL 4 MG PO TABS: 4.0000 mg | ORAL_TABLET | Freq: Four times a day (QID) | ORAL | Status: DC | PRN

## 2022-03-13 MED ORDER — ALBUTEROL SULFATE (2.5 MG/3ML) 0.083% IN NEBU
2.5000 mg | INHALATION_SOLUTION | Freq: Four times a day (QID) | RESPIRATORY_TRACT | Status: DC | PRN
Start: 2022-03-13 — End: 2022-04-05
  Filled 2022-03-13: qty 3

## 2022-03-13 MED ORDER — DEXAMETHASONE SODIUM PHOSPHATE 10 MG/ML IJ SOLN
INTRAMUSCULAR | Status: DC | PRN
  Administered 2022-03-13: 10 mg via INTRAVENOUS

## 2022-03-13 MED ORDER — FLUTICASONE FUROATE-VILANTEROL 200-25 MCG/ACT IN AEPB
1.0000 | INHALATION_SPRAY | Freq: Every day | RESPIRATORY_TRACT | Status: DC
  Administered 2022-03-14 – 2022-04-05 (×22): 1 via RESPIRATORY_TRACT
  Filled 2022-03-13 (×2): qty 28

## 2022-03-13 MED ORDER — ONDANSETRON HCL 4 MG/2ML IJ SOLN
INTRAMUSCULAR | Status: DC | PRN
  Administered 2022-03-13: 4 mg via INTRAVENOUS

## 2022-03-13 MED ORDER — HYDROMORPHONE HCL 1 MG/ML IJ SOLN
0.5000 mg | INTRAMUSCULAR | Status: DC | PRN
  Administered 2022-03-14 – 2022-03-15 (×9): 0.5 mg via INTRAVENOUS
  Filled 2022-03-13 (×9): qty 0.5

## 2022-03-13 MED ORDER — DIPHENHYDRAMINE HCL 25 MG PO CAPS
25.0000 mg | ORAL_CAPSULE | Freq: Four times a day (QID) | ORAL | Status: DC | PRN
  Administered 2022-03-22 – 2022-04-01 (×4): 25 mg via ORAL
  Filled 2022-03-13 (×4): qty 1

## 2022-03-13 MED ORDER — ALBUTEROL SULFATE HFA 108 (90 BASE) MCG/ACT IN AERS: 2.0000 | INHALATION_SPRAY | Freq: Four times a day (QID) | RESPIRATORY_TRACT | Status: DC | PRN

## 2022-03-13 SURGICAL SUPPLY — 69 items
ADH SKN CLS APL DERMABOND .7 (GAUZE/BANDAGES/DRESSINGS)
AGENT HMST 10 BLLW SHRT CANN (HEMOSTASIS) ×1
BIOPATCH RED 1 DISK 7.0 (GAUZE/BANDAGES/DRESSINGS) ×4 IMPLANT
BLADE CLIPPER SURG (BLADE) ×1 IMPLANT
CANISTER SUCT 3000ML PPV (MISCELLANEOUS) ×3 IMPLANT
CLIP TI WIDE RED SMALL 24 (CLIP) ×3 IMPLANT
CLIP VESOCCLUDE MED 24/CT (CLIP) ×3 IMPLANT
CNTNR URN SCR LID CUP LEK RST (MISCELLANEOUS) IMPLANT
CONT SPEC 4OZ STRL OR WHT (MISCELLANEOUS) ×8
COVER SURGICAL LIGHT HANDLE (MISCELLANEOUS) ×3 IMPLANT
DERMABOND ADVANCED (GAUZE/BANDAGES/DRESSINGS)
DERMABOND ADVANCED .7 DNX12 (GAUZE/BANDAGES/DRESSINGS) ×4 IMPLANT
DRAIN CHANNEL 19F RND (DRAIN) ×2 IMPLANT
DRAPE INCISE IOBAN 66X45 STRL (DRAPES) ×3 IMPLANT
DRAPE WARM FLUID 44X44 (DRAPES) ×3 IMPLANT
DRSG OPSITE POSTOP 4X12 (GAUZE/BANDAGES/DRESSINGS) ×1 IMPLANT
DRSG TEGADERM 4X4.75 (GAUZE/BANDAGES/DRESSINGS) ×4 IMPLANT
DRSG TELFA 3X8 NADH (GAUZE/BANDAGES/DRESSINGS) ×2 IMPLANT
ELECT BLADE 6.5 EXT (BLADE) ×3 IMPLANT
ELECT CAUTERY BLADE 6.4 (BLADE) ×3 IMPLANT
ELECT PAD DSPR THERM+ ADLT (MISCELLANEOUS) ×3 IMPLANT
ELECT REM PT RETURN 9FT ADLT (ELECTROSURGICAL) ×2
ELECTRODE REM PT RTRN 9FT ADLT (ELECTROSURGICAL) ×2 IMPLANT
EVACUATOR SILICONE 100CC (DRAIN) ×2 IMPLANT
GAUZE 4X4 16PLY ~~LOC~~+RFID DBL (SPONGE) ×1 IMPLANT
GAUZE SPONGE 4X4 12PLY STRL (GAUZE/BANDAGES/DRESSINGS) ×1 IMPLANT
GLOVE BIOGEL PI IND STRL 6 (GLOVE) ×2 IMPLANT
GLOVE BIOGEL PI INDICATOR 6 (GLOVE) ×3
GLOVE BIOGEL PI MICRO 5.5 (GLOVE) ×3
GLOVE BIOGEL PI MICRO STRL 5.5 (GLOVE) ×4 IMPLANT
GOWN STRL REUS W/ TWL LRG LVL3 (GOWN DISPOSABLE) ×4 IMPLANT
GOWN STRL REUS W/TWL LRG LVL3 (GOWN DISPOSABLE) ×8
HAND PENCIL TRP OPTION (MISCELLANEOUS) ×3 IMPLANT
HEMOSTAT HEMOBLAST BELLOWS (HEMOSTASIS) ×1 IMPLANT
HEMOSTAT SNOW SURGICEL 2X4 (HEMOSTASIS) ×2 IMPLANT
KIT TURNOVER KIT B (KITS) ×3 IMPLANT
NS IRRIG 1000ML POUR BTL (IV SOLUTION) ×6 IMPLANT
PACK COLON (CUSTOM PROCEDURE TRAY) ×1 IMPLANT
PAD ARMBOARD 7.5X6 YLW CONV (MISCELLANEOUS) ×3 IMPLANT
PAD DRESSING TELFA 3X8 NADH (GAUZE/BANDAGES/DRESSINGS) IMPLANT
RELOAD PROXIMATE 75MM BLUE (ENDOMECHANICALS) ×2 IMPLANT
RELOAD STAPLE 75 3.8 BLU REG (ENDOMECHANICALS) IMPLANT
RETRACTOR WOUND ALXS 34CM XLRG (MISCELLANEOUS) IMPLANT
RTRCTR WOUND ALEXIS 34CM XLRG (MISCELLANEOUS)
SEALER BIPOLAR AQUA 6.0 (INSTRUMENTS) ×3 IMPLANT
SPONGE T-LAP 18X18 ~~LOC~~+RFID (SPONGE) ×8 IMPLANT
STAPLER GUN LINEAR PROX 60 (STAPLE) ×1 IMPLANT
STAPLER PROXIMATE 75MM BLUE (STAPLE) ×1 IMPLANT
STAPLER VISISTAT 35W (STAPLE) ×1 IMPLANT
SUT CHROMIC 0 BP (SUTURE) ×6 IMPLANT
SUT ETHILON 2 0 FS 18 (SUTURE) ×2 IMPLANT
SUT MNCRL AB 4-0 PS2 18 (SUTURE) ×3 IMPLANT
SUT PDS AB 0 CTX 36 PDP370T (SUTURE) ×3 IMPLANT
SUT PDS AB 1 TP1 96 (SUTURE) ×6 IMPLANT
SUT PROLENE 4 0 RB 1 (SUTURE) ×4
SUT PROLENE 4-0 RB1 .5 CRCL 36 (SUTURE) ×4 IMPLANT
SUT SILK 2 0 TIES 10X30 (SUTURE) ×3 IMPLANT
SUT SILK 2 0SH CR/8 30 (SUTURE) ×1 IMPLANT
SUT SILK 3 0 SH CR/8 (SUTURE) ×2 IMPLANT
SUT SILK 3 0 TIES 10X30 (SUTURE) ×1 IMPLANT
SUT SILK 3 0SH CR/8 30 (SUTURE) ×1 IMPLANT
SUT VIC AB 0 CT1 36 (SUTURE) ×1 IMPLANT
SUT VIC AB 3-0 SH 27 (SUTURE) ×4
SUT VIC AB 3-0 SH 27X BRD (SUTURE) ×2 IMPLANT
SYR BULB IRRIG 60ML STRL (SYRINGE) ×3 IMPLANT
TOWEL GREEN STERILE (TOWEL DISPOSABLE) ×2 IMPLANT
TOWEL GREEN STERILE FF (TOWEL DISPOSABLE) ×1 IMPLANT
TRAY FOLEY MTR SLVR 16FR STAT (SET/KITS/TRAYS/PACK) ×1 IMPLANT
TUBE CONNECTING 12X1/4 (SUCTIONS) ×2 IMPLANT

## 2022-03-13 NOTE — Op Note (Signed)
Date: 03/13/22  Patient: Eric [name] MRN: [id number]  Preoperative Diagnosis: Metastatic colon cancer to liver; end ileostomy Postoperative Diagnosis: Same  Procedure: Exploratory laparotomy with lysis of adhesions Intraoperative ultrasound of liver Cholecystectomy Wedge resection of liver lesions x2 (segment 5 and segment 4b) End ileostomy takedown with side-to-side ileocolic anastomosis  Surgeon: Michaelle Birks, MD Assistant: Michael Boston, MD; Radonna Ricker, MD (Resident)  EBL: 250 mL  Anesthesia: General endotracheal  Specimens:  Left liver nodule Left liver nodule #2 Segment 5 liver mass Gallbladder Segment 4 liver mass Ileostomy  Indications: Eric [name] is a 51 yo male who presented in February 2022 with an obstructing right colon mass and underwent a right hemicolectomy with end ileostomy at that time.  Final pathology showed a T4N0 tumor.  He was also noted to have multiple liver metastases at the time of diagnosis, and subsequently completed adjuvant FOLFOX.  His liver lesion significantly decreased in size on chemotherapy.  He was then started on maintenance Xeloda last September on follow-up imaging has showed stable size of the liver lesions, with complete radiographic disappearance of some of the lesions.  He has no other evidence of disease.  After an extensive discussion of the risks and benefits of surgery and preoperative medical clearance, he has agreed to proceed with resection of his liver metastases as well as ileostomy reversal.  Findings: Multiple nodules on the surface of the liver, two of which were sent for frozen section, which showed benign bile duct hamartomas.  Two liver lesions were identified corresponding to the residual tumors visualized on preoperative MRI, both of which were peripheral and excised via wedge resection.  No other evidence of metastatic disease within the liver or anywhere else in the abdomen.  Procedure details: Informed consent was  obtained in the preoperative area prior to the procedure. The patient was brought to the operating room and placed on the table in the supine position. General anesthesia was induced and appropriate lines and drains were placed for intraoperative monitoring. Perioperative antibiotics were administered per SCIP guidelines. The abdomen was prepped and draped in the usual sterile fashion. A pre-procedure timeout was taken verifying patient identity, surgical site and procedure to be performed.  A midline skin incision was made through the previous laparotomy scar, extending from the xiphoid process down below the umbilicus.  The subcutaneous tissue was divided with cautery.  The fascia was opened at the linea alba along the length of the incision.  There were some adhesions between the omentum, the small bowel, and the midline, and these adhesions were taken down using a combination of sharp dissection and cautery, taking care not to injure the small bowel.  A Thompson retractor was placed.  The falciform ligament was taken down off the abdominal wall with cautery.  The liver was examined and palpated.  There were diffuse small nodules on the surface of the liver, all less than 1 cm in size.  Two of these were sharply excised from the left lobe of the liver and sent for frozen section, which was consistent with benign bile duct hamartomas.  Hemostasis was achieved at the biopsy sites using cautery and argon.  There was a visible lesion on the surface of the right liver on the border of segments 5 and 6, corresponding to the known metastatic lesion identified on preop MRI.  No other visible surface lesions were identified.  Intraoperative ultrasound was performed and another very small tumor was visualized immediately adjacent to the gallbladder fossa in segment  4B, approximately 5 mm in diameter.  The tumor was hyperechoic and not consistent with a typical metastatic colon cancer lesion, however it's location  corresponded with MRI findings so the decision was made to resect this.    First the segment 5 tumor was resected.  Margins were marked out around the tumor on the surface of the liver with cautery.  A wedge resection was then performed, dividing the liver parenchyma with a crush clamp technique.  Hemostasis was achieved with the aquamantys.  Small visible blood vessels were clipped prior to division.  The specimen was passed off the field and sent for routine pathology.  Hemostasis was achieved at the excision site using argon and the aqua mantis.  Next, a cholecystectomy was performed in dome down fashion, taking the gallbladder off the liver using cautery.  The cystic artery was tied with a 2-0 silk tie and divided.  The cystic duct was clamped and divided, and the cystic duct stump was ligated with a 2-0 silk tie.  The gallbladder was passed off the field and sent for routine pathology.  Margins were then marked out around the segment 4B tumor adjacent to the gallbladder fossa, using the ultrasound to mark out the margins.  The liver parenchyma was then divided with a crush clamp technique, and small vessels were clipped.  The specimen was excised and sent for routine pathology.  Hemostasis was achieved at the excision site with argon and the aquamantys.  Next the small bowel was run from the ligament of Treitz to the terminal ileum.  There were multiple loops of small bowel adherent to the retroperitoneum.  These loops were taken down with sharp dissection.  There were 3 small serosal tears which were oversewn with 3-0 silk Lembert sutures.  A 75 mm GIA stapler with a blue load was then used to transect the terminal ileum at the abdominal wall to separate it from from the stoma.  The end of the transverse colon was identified and mobilized using cautery and blunt dissection.  The stapled end of the ileum was then laid next to the end of the transverse colon in an isoperistaltic fashion.  A 3-0 silk stay  suture was placed.  Enterotomies were created on the terminal ileum and on the tenia of the transverse colon.  A side-to-side isoperistaltic anastomosis was then created with a 75 mm GIA stapler with a blue load.  The common enterotomy was closed with a TA-60b stapler.  The anastomosis was palpated and was widely patent.  The mesenteric defect was closed with 3-0 silk figure-of-eight sutures.  Next the abdomen was thoroughly irrigated with several liters of warm saline.  There was a small amount of oozing from the liver resection sites, which was controlled with the aquamantys.  Hemoblast was applied to each of the two liver resection sites.  Next the skin was incised circumferentially around the ileostomy.  The subcutaneous tissue was divided with cautery down to the level of the fascia.  The bowel was then dissected off the fascia using cautery.  The stoma was completely excised and sent for routine pathology.   Next fresh sterile drapes were laid for closure in accordance with the colorectal ERAS protocol, and the entire team re-gowned and gloved.  The posterior rectus sheath was closed the ileostomy site with a running 0 PDS suture.  The anterior rectus sheath was then closed in similar fashion.  The fascia was closed at midline with a running looped 1 PDS suture, and  the skin was closed with staples.  A sterile dressing was applied.  A 0 Vicryl pursestring suture was used to partially close the skin at the stoma site.  The wound was then packed with saline-moistened gauze and covered with a dry dressing.  The patient tolerated the procedure well with no apparent complications.  All counts were correct x2 at the end of the procedure. The patient was extubated and taken to PACU in stable condition.  Michaelle Birks, MD 03/13/22 1:53 PM

## 2022-03-13 NOTE — Anesthesia Procedure Notes (Signed)
Procedure Name: Intubation Date/Time: 03/13/2022 8:52 AM Performed by: Mariea Clonts, CRNA Pre-anesthesia Checklist: Patient identified, Emergency Drugs available, Suction available and Patient being monitored Patient Re-evaluated:Patient Re-evaluated prior to induction Oxygen Delivery Method: Circle System Utilized Preoxygenation: Pre-oxygenation with 100% oxygen Induction Type: IV induction Ventilation: Mask ventilation without difficulty Laryngoscope Size: Mac and 4 Grade View: Grade I Tube type: Oral Tube size: 7.5 mm Number of attempts: 1 Airway Equipment and Method: Stylet and Oral airway Placement Confirmation: ETT inserted through vocal cords under direct vision, positive ETCO2 and breath sounds checked- equal and bilateral Tube secured with: Tape Dental Injury: Teeth and Oropharynx as per pre-operative assessment

## 2022-03-13 NOTE — Anesthesia Preprocedure Evaluation (Addendum)
Anesthesia Evaluation  Patient identified by MRN, date of birth, ID band Patient awake    Reviewed: Allergy & Precautions, NPO status , Patient's Chart, lab work & pertinent test results  History of Anesthesia Complications Negative for: history of anesthetic complications  Airway Mallampati: II  TM Distance: >3 FB Neck ROM: Full    Dental  (+) Poor Dentition, Chipped, Missing, Dental Advisory Given   Pulmonary shortness of breath, COPD, Current Smoker and Patient abstained from smoking.,    Pulmonary exam normal breath sounds clear to auscultation       Cardiovascular + DOE  Normal cardiovascular exam Rhythm:Regular Rate:Normal     Neuro/Psych negative neurological ROS  negative psych ROS   GI/Hepatic  Liver mass   Colon cancer    Endo/Other   Na 133   Renal/GU Renal disease     Musculoskeletal negative musculoskeletal ROS (+)   Abdominal   Peds  Hematology  (+) Blood dyscrasia, anemia ,  Plt 528k    Anesthesia Other Findings Covid+ 11/15/20   Reproductive/Obstetrics                            Anesthesia Physical  Anesthesia Plan  ASA: 3  Anesthesia Plan: General   Post-op Pain Management: Tylenol PO (pre-op)* and Gabapentin PO (pre-op)*   Induction: Intravenous  PONV Risk Score and Plan: 4 or greater and Treatment may vary due to age or medical condition, Ondansetron, Dexamethasone and Midazolam  Airway Management Planned: Oral ETT  Additional Equipment:   Intra-op Plan:   Post-operative Plan: Extubation in OR  Informed Consent: I have reviewed the patients History and Physical, chart, labs and discussed the procedure including the risks, benefits and alternatives for the proposed anesthesia with the patient or authorized representative who has indicated his/her understanding and acceptance.     Dental advisory given  Plan Discussed with: CRNA  Anesthesia  Plan Comments: (2 x PIV)       Anesthesia Quick Evaluation

## 2022-03-13 NOTE — Transfer of Care (Signed)
Immediate Anesthesia Transfer of Care Note  Patient: Eric [name]  Procedure(s) Performed: OPEN ILEOSTOMY REVERSAL (Abdomen) PARTIAL HEPATECTOMY (Abdomen) INTRAOPERATIVE LIVER ULTRASOUND (Abdomen) CHOLECYSTECTOMY (Abdomen)  Patient Location: PACU  Anesthesia Type:General  Level of Consciousness: awake, alert  and oriented  Airway & Oxygen Therapy: Patient Spontanous Breathing and Patient connected to nasal cannula oxygen  Post-op Assessment: Report given to RN, Post -op Vital signs reviewed and stable and Patient moving all extremities X 4  Post vital signs: Reviewed and stable  Last Vitals:  Vitals Value Taken Time  BP 166/109 03/13/22 1330  Temp    Pulse 91 03/13/22 1333  Resp 11 03/13/22 1333  SpO2 100 % 03/13/22 1333  Vitals shown include unvalidated device data.  Last Pain:  Vitals:   03/13/22 0658  TempSrc:   PainSc: 0-No pain      Patients Stated Pain Goal: 3 (50/93/26 7124)  Complications: No notable events documented.

## 2022-03-13 NOTE — Interval H&P Note (Signed)
History and Physical Interval Note:  03/13/2022 8:22 AM  Eric [name]  has presented today for surgery, with the diagnosis of METASTATIC COLON CANCER, ILEOSTOMY IN PLACE.  The various methods of treatment have been discussed with the patient and family. After consideration of risks, benefits and other options for treatment, the patient has consented to  Procedure(s): OPEN ILEOSTOMY REVERSAL (N/A) PARTIAL HEPATECTOMY WITH INTRAOPERATIVE ULTRASOUND (N/A) as a surgical intervention.  The patient's history has been reviewed, patient examined, no change in status, stable for surgery.  I have reviewed the patient's chart and labs.  Questions were answered to the patient's satisfaction. Admit to inpatient postoperatively.   Dwan Bolt

## 2022-03-13 NOTE — Anesthesia Postprocedure Evaluation (Signed)
Anesthesia Post Note  Patient: Eric [name]  Procedure(s) Performed: OPEN ILEOSTOMY REVERSAL (Abdomen) PARTIAL HEPATECTOMY (Abdomen) INTRAOPERATIVE LIVER ULTRASOUND (Abdomen) CHOLECYSTECTOMY (Abdomen)     Patient location during evaluation: PACU Anesthesia Type: General Level of consciousness: sedated and patient cooperative Pain management: pain level controlled Vital Signs Assessment: post-procedure vital signs reviewed and stable Respiratory status: spontaneous breathing Cardiovascular status: stable Anesthetic complications: no   No notable events documented.  Last Vitals:  Vitals:   03/13/22 1633 03/13/22 1648  BP: (!) 148/107 (!) 138/109  Pulse: (!) 110 (!) 113  Resp: 19 20  Temp:  37.2 C  SpO2: 95% 96%    Last Pain:  Vitals:   03/13/22 1648  TempSrc: Oral  PainSc:                  Nolon Nations

## 2022-03-14 ENCOUNTER — Encounter (HOSPITAL_COMMUNITY): Payer: Self-pay | Admitting: Surgery

## 2022-03-14 LAB — COMPREHENSIVE METABOLIC PANEL
ALT: 187 U/L — ABNORMAL HIGH (ref 0–44)
AST: 215 U/L — ABNORMAL HIGH (ref 15–41)
Albumin: 3.3 g/dL — ABNORMAL LOW (ref 3.5–5.0)
Alkaline Phosphatase: 111 U/L (ref 38–126)
Anion gap: 10 (ref 5–15)
BUN: 16 mg/dL (ref 6–20)
CO2: 23 mmol/L (ref 22–32)
Calcium: 8.9 mg/dL (ref 8.9–10.3)
Chloride: 104 mmol/L (ref 98–111)
Creatinine, Ser: 0.99 mg/dL (ref 0.61–1.24)
GFR, Estimated: >60 mL/min (ref >60)
Glucose, Bld: 135 mg/dL — ABNORMAL HIGH (ref 70–99)
Potassium: 4.5 mmol/L (ref 3.5–5.1)
Sodium: 137 mmol/L (ref 135–145)
Total Bilirubin: 1.3 mg/dL — ABNORMAL HIGH (ref 0.3–1.2)
Total Protein: 6.3 g/dL — ABNORMAL LOW (ref 6.5–8.1)

## 2022-03-14 LAB — POCT I-STAT, CHEM 8
BUN: 14 mg/dL (ref 6–20)
Calcium, Ion: 1.24 mmol/L (ref 1.15–1.40)
Chloride: 102 mmol/L (ref 98–111)
Creatinine, Ser: 0.8 mg/dL (ref 0.61–1.24)
Glucose, Bld: 150 mg/dL — ABNORMAL HIGH (ref 70–99)
HCT: 30 % — ABNORMAL LOW (ref 39.0–52.0)
Hemoglobin: 10.2 g/dL — ABNORMAL LOW (ref 13.0–17.0)
Potassium: 4.4 mmol/L (ref 3.5–5.1)
Sodium: 139 mmol/L (ref 135–145)
TCO2: 26 mmol/L (ref 22–32)

## 2022-03-14 LAB — CBC
HCT: 32.9 % — ABNORMAL LOW (ref 39.0–52.0)
Hemoglobin: 10.7 g/dL — ABNORMAL LOW (ref 13.0–17.0)
MCH: 29.7 pg (ref 26.0–34.0)
MCHC: 32.5 g/dL (ref 30.0–36.0)
MCV: 91.4 fL (ref 80.0–100.0)
Platelets: 224 10*3/uL (ref 150–400)
RBC: 3.6 MIL/uL — ABNORMAL LOW (ref 4.22–5.81)
RDW: 15.2 % (ref 11.5–15.5)
WBC: 23.5 10*3/uL — ABNORMAL HIGH (ref 4.0–10.5)
nRBC: 0 % (ref 0.0–0.2)

## 2022-03-14 MED ORDER — DOCUSATE SODIUM 100 MG PO CAPS
100.0000 mg | ORAL_CAPSULE | Freq: Two times a day (BID) | ORAL | Status: DC
  Administered 2022-03-14 – 2022-03-17 (×7): 100 mg via ORAL
  Filled 2022-03-14 (×7): qty 1

## 2022-03-14 NOTE — Progress Notes (Signed)
PT Cancellation Note  Patient Details Name: Eric [name] MRN: [id number] DOB: 11-22-1970   Cancelled Treatment:    Reason Eval/Treat Not Completed: Pain limiting ability to participate Returned to try ot complete PT eval. Pt reports severe pain and is refusing mobility at this time.  Encouraged him to get OOB later with nursing.  Will attempt eval again tomorrow.    Melvern Banker 03/14/2022, 1:32 PM Lavonia Dana, [place]  Office [phone] 03/14/2022

## 2022-03-14 NOTE — Progress Notes (Signed)
PT Cancellation Note  Patient Details Name: Eric [name] MRN: [id number] DOB: 24-Apr-1971   Cancelled Treatment:    Reason Eval/Treat Not Completed: Pain limiting ability to participate.  Pt reports pain is too severe to move despite having pain meds prior to PT arrival.  Encouraged pt, explained importance of mobility and pt still not willing at this time.  PT will check back later today of time allows, otherwise will return tomorrow.  Encouraged pt to at least get OOB with nursing staff later to and get to recliner.  Lavonia Dana, [place]  Office [phone] 03/14/2022    Melvern Banker 03/14/2022, 9:21 AM

## 2022-03-14 NOTE — Progress Notes (Signed)
    1 Day Post-Op  Subjective: No acute issues overnight. Mildly tachycardic postop, improved. Endorses pain this morning. Creatinine normal. Transaminases mildly elevated as expected. Had mild nausea postop but no vomiting.   Objective: Vital signs in last 24 hours: Temp:  [97.7 F (36.5 C)-98.9 F (37.2 C)] 98.7 F (37.1 C) (06/02 2229) Pulse Rate:  [83-113] 109 (06/02 2229) Resp:  [14-28] 20 (06/02 2229) BP: (138-166)/(91-111) 155/109 (06/02 2229) SpO2:  [92 %-100 %] 95 % (06/02 2229) Weight:  [69.4 kg] 69.4 kg (06/02 0623)    Intake/Output from previous day: 06/02 0701 - 06/03 0700 In: 3567.3 [P.O.:120; I.V.:3097.3; IV Piggyback:350] Out: 870 [Urine:470; Blood:400] Intake/Output this shift: Total I/O In: 817.3 [P.O.:120; I.V.:697.3] Out: -   PE: General: resting comfortably, NAD Neuro: alert and oriented, no focal deficits Resp: normal work of breathing on room air Abdomen: soft, nondistended, appropriately tender. RLQ prior ostomy site with serosanguinous drainage on dressing. Extremities: warm and well-perfused   Lab Results:  Recent Labs    03/13/22 2103 03/14/22 0203  WBC 25.6* 23.5*  HGB 11.6* 10.7*  HCT 34.5* 32.9*  PLT 284 224   BMET Recent Labs    03/13/22 1541 03/14/22 0203  NA 139 137  K 4.6 4.5  CL 104 104  CO2 23 23  GLUCOSE 180* 135*  BUN 13 16  CREATININE 1.10 0.99  CALCIUM 9.0 8.9   PT/INR No results for input(s): LABPROT, INR in the last 72 hours. CMP     Component Value Date/Time   NA 137 03/14/2022 0203   K 4.5 03/14/2022 0203   CL 104 03/14/2022 0203   CO2 23 03/14/2022 0203   GLUCOSE 135 (H) 03/14/2022 0203   BUN 16 03/14/2022 0203   CREATININE 0.99 03/14/2022 0203   CREATININE 1.18 02/20/2022 1117   CALCIUM 8.9 03/14/2022 0203   PROT 6.3 (L) 03/14/2022 0203   ALBUMIN 3.3 (L) 03/14/2022 0203   AST 215 (H) 03/14/2022 0203   AST 18 02/20/2022 1117   ALT 187 (H) 03/14/2022 0203   ALT 12 02/20/2022 1117   ALKPHOS 111  03/14/2022 0203   BILITOT 1.3 (H) 03/14/2022 0203   BILITOT 0.6 02/20/2022 1117   GFRNONAA >60 03/14/2022 0203   GFRNONAA >60 02/20/2022 1117   Lipase  No results found for: LIPASE     Studies/Results: No results found.      Assessment/Plan 51 yo male with stage IV colon cancer with liver metastases, POD1 s/p open hepatic wedge resection x2 and ileostomy reversal.  - Advance to full liquid diet, IVF at 50 ml/hr - Multimodal pain control: scheduled tylenol, robaxin and gabapentin. PRN oxycodone and dilaudid. - Remove foley catheter - Ambulate, PT ordered - COPD: Currently on room air. Continue home Breo ellipta, prn albuterol. Aggressive pulmonary toilet. - VTE: lovenox, SCDs - Dispo: inpatient, med-surg floor    LOS: 1 day    Michaelle Birks, MD [facility] General, Hepatobiliary and Pancreatic Surgery 03/14/22 5:40 AM

## 2022-03-15 LAB — COMPREHENSIVE METABOLIC PANEL
ALT: 133 U/L — ABNORMAL HIGH (ref 0–44)
AST: 103 U/L — ABNORMAL HIGH (ref 15–41)
Albumin: 2.8 g/dL — ABNORMAL LOW (ref 3.5–5.0)
Alkaline Phosphatase: 94 U/L (ref 38–126)
Anion gap: 6 (ref 5–15)
BUN: 14 mg/dL (ref 6–20)
CO2: 25 mmol/L (ref 22–32)
Calcium: 8.4 mg/dL — ABNORMAL LOW (ref 8.9–10.3)
Chloride: 102 mmol/L (ref 98–111)
Creatinine, Ser: 0.98 mg/dL (ref 0.61–1.24)
GFR, Estimated: >60 mL/min (ref >60)
Glucose, Bld: 119 mg/dL — ABNORMAL HIGH (ref 70–99)
Potassium: 4.1 mmol/L (ref 3.5–5.1)
Sodium: 133 mmol/L — ABNORMAL LOW (ref 135–145)
Total Bilirubin: 1.4 mg/dL — ABNORMAL HIGH (ref 0.3–1.2)
Total Protein: 6 g/dL — ABNORMAL LOW (ref 6.5–8.1)

## 2022-03-15 LAB — CBC
HCT: 29.1 % — ABNORMAL LOW (ref 39.0–52.0)
Hemoglobin: 9.6 g/dL — ABNORMAL LOW (ref 13.0–17.0)
MCH: 30.4 pg (ref 26.0–34.0)
MCHC: 33 g/dL (ref 30.0–36.0)
MCV: 92.1 fL (ref 80.0–100.0)
Platelets: 197 10*3/uL (ref 150–400)
RBC: 3.16 MIL/uL — ABNORMAL LOW (ref 4.22–5.81)
RDW: 15.3 % (ref 11.5–15.5)
WBC: 22.8 10*3/uL — ABNORMAL HIGH (ref 4.0–10.5)
nRBC: 0 % (ref 0.0–0.2)

## 2022-03-15 MED ORDER — KETOROLAC TROMETHAMINE 15 MG/ML IJ SOLN
15.0000 mg | Freq: Three times a day (TID) | INTRAMUSCULAR | Status: AC
  Administered 2022-03-15 – 2022-03-19 (×15): 15 mg via INTRAVENOUS
  Filled 2022-03-15 (×15): qty 1

## 2022-03-15 NOTE — Progress Notes (Signed)
    2 Days Post-Op  Subjective: Patient declined PT yesterday due to pain. Tachycardic in 120s, endorses continued pain this morning. On MAR review he was given dilaudid but no oxycodone yesterday or overnight. Transaminases continued to downtrend. WBC has been elevated postop, down slightly to 22 today. Creatinine normal, good UOP. Patient reports some nausea.   Objective: Vital signs in last 24 hours: Temp:  [98.3 F (36.8 C)-99.2 F (37.3 C)] 98.7 F (37.1 C) (06/04 0554) Pulse Rate:  [114-126] 126 (06/04 0554) Resp:  [16-19] 16 (06/04 0554) BP: (138-152)/(91-105) 139/94 (06/04 0554) SpO2:  [91 %-93 %] 91 % (06/04 0554) Weight:  [78 kg] 78 kg (06/04 0500) Last BM Date : 03/14/22  Intake/Output from previous day: 06/03 0701 - 06/04 0700 In: 100 [P.O.:100] Out: 1045 [Urine:1045] Intake/Output this shift: No intake/output data recorded.  PE: General: resting comfortably, NAD Neuro: alert and oriented, no focal deficits Resp: normal work of breathing on room air Abdomen: soft, nondistended, appropriately tender. RLQ prior ostomy site with serosanguinous drainage. Honeycomb dressing in place over midline incision. Extremities: warm and well-perfused   Lab Results:  Recent Labs    03/14/22 0203 03/15/22 0128  WBC 23.5* 22.8*  HGB 10.7* 9.6*  HCT 32.9* 29.1*  PLT 224 197   BMET Recent Labs    03/14/22 0203 03/15/22 0128  NA 137 133*  K 4.5 4.1  CL 104 102  CO2 23 25  GLUCOSE 135* 119*  BUN 16 14  CREATININE 0.99 0.98  CALCIUM 8.9 8.4*   PT/INR No results for input(s): LABPROT, INR in the last 72 hours. CMP     Component Value Date/Time   NA 133 (L) 03/15/2022 0128   K 4.1 03/15/2022 0128   CL 102 03/15/2022 0128   CO2 25 03/15/2022 0128   GLUCOSE 119 (H) 03/15/2022 0128   BUN 14 03/15/2022 0128   CREATININE 0.98 03/15/2022 0128   CREATININE 1.18 02/20/2022 1117   CALCIUM 8.4 (L) 03/15/2022 0128   PROT 6.0 (L) 03/15/2022 0128   ALBUMIN 2.8 (L)  03/15/2022 0128   AST 103 (H) 03/15/2022 0128   AST 18 02/20/2022 1117   ALT 133 (H) 03/15/2022 0128   ALT 12 02/20/2022 1117   ALKPHOS 94 03/15/2022 0128   BILITOT 1.4 (H) 03/15/2022 0128   BILITOT 0.6 02/20/2022 1117   GFRNONAA >60 03/15/2022 0128   GFRNONAA >60 02/20/2022 1117   Lipase  No results found for: LIPASE     Studies/Results: No results found.      Assessment/Plan 51 yo male with stage IV colon cancer with liver metastases, POD2 s/p open hepatic wedge resection x2 and ileostomy reversal.  - Remain on full liquid diet, IVF at 109. Patient is at risk for ileus given adhesiolysis. - Multimodal pain control: scheduled tylenol, robaxin and gabapentin. PRN oxycodone and dilaudid. Need to maximize use of prn medications. Will also add scheduled toradol. - Tachycardia: Likely secondary to pain. Anastomotic leak or missed enterotomy is also a concern, but abdomen is soft and patient is nontoxic in appearance. - Ambulate, importance of mobility was stressed with the patient today. - COPD: Currently on room air. Continue home Breo ellipta, prn albuterol. Aggressive pulmonary toilet, IS given to patient this morning. - VTE: lovenox, SCDs - Dispo: inpatient, med-surg floor    LOS: 2 days    Michaelle Birks, MD [facility] Surgery General, Hepatobiliary and Pancreatic Surgery 03/15/22 7:27 AM

## 2022-03-15 NOTE — Progress Notes (Signed)
Notified by RN of saturated gauze and new swelling above stoma site. Patient reports swelling is new, he feels about the same/ maybe slightly better than earlier today. Vitals are unchanged compared to earlier in the day with tachycardia and hypertension, afebrile.  Abd soft, appropriately tender. Honeycomb with old blood. Newly placed gauze over stoma site with some bloody drainage; stoma site is clean without erythema or overt palpable hematoma/mass. Packing from OR in place and saturated with blood with slow oozing seeming to come from the medial aspect of the wound. Direct pressure held for 2+ minutes and new pressure dressing applied. Continue observation, reinforce dressing as needed, follow up AM labs unless clinical change.

## 2022-03-15 NOTE — Progress Notes (Signed)
PT Cancellation Note  Patient Details Name: Eric [name] MRN: [id number] DOB: [birth year]   Cancelled Treatment:    Reason Eval/Treat Not Completed: Pain limiting ability to participate. Pt reporting significant pain this AM and declining participation PT evaluation. PT will continue to f/u with pt acutely and re-attempt evaluation as available and appropriate.    Randall 03/15/2022, 10:15 AM

## 2022-03-16 LAB — COMPREHENSIVE METABOLIC PANEL
ALT: 91 U/L — ABNORMAL HIGH (ref 0–44)
AST: 47 U/L — ABNORMAL HIGH (ref 15–41)
Albumin: 2.5 g/dL — ABNORMAL LOW (ref 3.5–5.0)
Alkaline Phosphatase: 98 U/L (ref 38–126)
Anion gap: 7 (ref 5–15)
BUN: 17 mg/dL (ref 6–20)
CO2: 26 mmol/L (ref 22–32)
Calcium: 8.5 mg/dL — ABNORMAL LOW (ref 8.9–10.3)
Chloride: 103 mmol/L (ref 98–111)
Creatinine, Ser: 1.04 mg/dL (ref 0.61–1.24)
GFR, Estimated: >60 mL/min (ref >60)
Glucose, Bld: 121 mg/dL — ABNORMAL HIGH (ref 70–99)
Potassium: 4.1 mmol/L (ref 3.5–5.1)
Sodium: 136 mmol/L (ref 135–145)
Total Bilirubin: 1.4 mg/dL — ABNORMAL HIGH (ref 0.3–1.2)
Total Protein: 6.1 g/dL — ABNORMAL LOW (ref 6.5–8.1)

## 2022-03-16 LAB — CBC
HCT: 26.7 % — ABNORMAL LOW (ref 39.0–52.0)
Hemoglobin: 8.8 g/dL — ABNORMAL LOW (ref 13.0–17.0)
MCH: 30.4 pg (ref 26.0–34.0)
MCHC: 33 g/dL (ref 30.0–36.0)
MCV: 92.4 fL (ref 80.0–100.0)
Platelets: 198 10*3/uL (ref 150–400)
RBC: 2.89 MIL/uL — ABNORMAL LOW (ref 4.22–5.81)
RDW: 15.1 % (ref 11.5–15.5)
WBC: 14 10*3/uL — ABNORMAL HIGH (ref 4.0–10.5)
nRBC: 0 % (ref 0.0–0.2)

## 2022-03-16 LAB — SURGICAL PATHOLOGY

## 2022-03-16 MED ORDER — CHLORHEXIDINE GLUCONATE CLOTH 2 % EX PADS
6.0000 | MEDICATED_PAD | Freq: Every day | CUTANEOUS | Status: DC
  Administered 2022-03-17 – 2022-04-04 (×19): 6 via TOPICAL

## 2022-03-16 MED ORDER — HYDROMORPHONE HCL 1 MG/ML IJ SOLN
0.5000 mg | INTRAMUSCULAR | Status: DC | PRN
  Administered 2022-03-17 (×4): 0.5 mg via INTRAVENOUS
  Filled 2022-03-16 (×5): qty 0.5

## 2022-03-16 MED ORDER — SODIUM CHLORIDE 0.9% FLUSH: 10.0000 mL | INTRAVENOUS | Status: DC | PRN

## 2022-03-16 MED ORDER — MELATONIN 3 MG PO TABS
3.0000 mg | ORAL_TABLET | Freq: Every day | ORAL | Status: DC
  Administered 2022-03-17 (×2): 3 mg via ORAL
  Filled 2022-03-16 (×2): qty 1

## 2022-03-16 MED ORDER — SODIUM CHLORIDE 0.9% FLUSH
10.0000 mL | Freq: Two times a day (BID) | INTRAVENOUS | Status: DC
  Administered 2022-03-16 – 2022-04-02 (×27): 10 mL

## 2022-03-16 NOTE — Progress Notes (Signed)
Mobility Specialist Progress Note:   03/16/22 1450  Mobility  Activity Ambulated with assistance in hallway  Level of Assistance Standby assist, set-up cues, supervision of patient - no hands on  Assistive Device Front wheel walker  Distance Ambulated (ft) 130 ft  Activity Response Tolerated well  $Mobility charge 1 Mobility   Pt agreeable to mobility session. Ambulated at supervision level with RW. Distance limited secondary to abdominal pain. Pt back in bed with all needs met, bed alarm on.   Nelta Numbers Acute Rehab Secure Chat or Office Phone: [phone]

## 2022-03-16 NOTE — Plan of Care (Signed)
  Problem: Clinical Measurements: Goal: Will remain free from infection Outcome: Progressing   Problem: Clinical Measurements: Goal: Diagnostic test results will improve Outcome: Progressing   Problem: Elimination: Goal: Will not experience complications related to bowel motility Outcome: Progressing   Problem: Pain Managment: Goal: General experience of comfort will improve Outcome: Progressing

## 2022-03-16 NOTE — Progress Notes (Signed)
    3 Days Post-Op  Subjective: Patient feeling better this morning. Pain control improved. Tachycardia improved, HR in low 100s this morning. Afebrile. Tolerating full liquids, no flatus or BM yet. Denies nausea/vomiting. Declined PT yesterday but walking in hall with PT this morning. WBC downtrending significantly.   Objective: Vital signs in last 24 hours: Temp:  [97.6 F (36.4 C)-98.6 F (37 C)] 98.3 F (36.8 C) (06/05 0808) Pulse Rate:  [102-116] 102 (06/05 0808) Resp:  [14-22] 18 (06/05 0808) BP: (122-145)/(77-97) 127/96 (06/05 0808) SpO2:  [92 %-96 %] 94 % (06/05 0808) Weight:  [74.9 kg] 74.9 kg (06/05 0500) Last BM Date : 03/13/22  Intake/Output from previous day: 06/04 0701 - 06/05 0700 In: 3208.6 [P.O.:560; I.V.:2648.6] Out: -  Intake/Output this shift: No intake/output data recorded.  PE: General: resting comfortably, NAD Neuro: alert and oriented, no focal deficits Resp: normal work of breathing on room air Abdomen: soft, nondistended, appropriately tender. RLQ prior ostomy site is clean and dry, no active bleeding. Midline incision is clean and dry with staples in place. Extremities: warm and well-perfused   Lab Results:  Recent Labs    03/15/22 0128 03/16/22 0359  WBC 22.8* 14.0*  HGB 9.6* 8.8*  HCT 29.1* 26.7*  PLT 197 198   BMET Recent Labs    03/15/22 0128 03/16/22 0359  NA 133* 136  K 4.1 4.1  CL 102 103  CO2 25 26  GLUCOSE 119* 121*  BUN 14 17  CREATININE 0.98 1.04  CALCIUM 8.4* 8.5*   PT/INR No results for input(s): LABPROT, INR in the last 72 hours. CMP     Component Value Date/Time   NA 136 03/16/2022 0359   K 4.1 03/16/2022 0359   CL 103 03/16/2022 0359   CO2 26 03/16/2022 0359   GLUCOSE 121 (H) 03/16/2022 0359   BUN 17 03/16/2022 0359   CREATININE 1.04 03/16/2022 0359   CREATININE 1.18 02/20/2022 1117   CALCIUM 8.5 (L) 03/16/2022 0359   PROT 6.1 (L) 03/16/2022 0359   ALBUMIN 2.5 (L) 03/16/2022 0359   AST 47 (H)  03/16/2022 0359   AST 18 02/20/2022 1117   ALT 91 (H) 03/16/2022 0359   ALT 12 02/20/2022 1117   ALKPHOS 98 03/16/2022 0359   BILITOT 1.4 (H) 03/16/2022 0359   BILITOT 0.6 02/20/2022 1117   GFRNONAA >60 03/16/2022 0359   GFRNONAA >60 02/20/2022 1117   Lipase  No results found for: LIPASE     Studies/Results: No results found.      Assessment/Plan 51 yo male with stage IV colon cancer with liver metastases, POD3 s/p open hepatic wedge resection x2 and ileostomy reversal.  - Advance to soft diet, SLIV - Multimodal pain control: scheduled tylenol, robaxin, toradol and gabapentin. PRN oxycodone and dilaudid. - Tachycardia: Likely secondary to pain and/or atelectasis. Improving today.  - Ambulate, pulmonary toilet, emphasized again with patient the importance of mobility. - COPD: Currently on room air. Continue home Breo ellipta, prn albuterol. Aggressive pulmonary toilet. - VTE: lovenox, SCDs - Dispo: inpatient, med-surg floor    LOS: 3 days    Michaelle Birks, MD [facility] Surgery General, Hepatobiliary and Pancreatic Surgery 03/16/22 8:46 AM

## 2022-03-16 NOTE — Discharge Planning (Signed)
Oncology Discharge Planning Admission Note  [facility] Address: [place], [place], [place] [address] Hours of Operation:  8am - 5pm, Monday - Friday  Clinic Contact Information:  [phone]  Oncology Care Team: Medical Oncologist:  Dr. [name]  Contacted Marcell Barlow to inform that the oncology provider Dr. Benay Spice is aware of this hospital admission dated 03/13/22, and the cancer center will follow [facility] inpatient care to assist with discharge planning as indicated by the oncologist.  We will reach out to you closer to discharge date to arrange your follow up care. He appreciated the notification.  Disclaimer:  This [facility] note does not imply a formal consult request has been made by the admitting attending for this admission or there will be an inpatient consult completed by oncology.  Please request oncology consults as per standard process as indicated.

## 2022-03-16 NOTE — Progress Notes (Signed)
Physical Therapy Discharge Patient Details Name: Eric [name] MRN: [id number] DOB: [birth year] Today's Date: 03/16/2022 Time: 3128-1188 PT Time Calculation (min) (ACUTE ONLY): 14 min  Patient discharged from PT services secondary to goals met and no further PT needs identified.  Please see latest therapy progress note for current level of functioning and progress toward goals.    Progress and discharge plan discussed with patient and/or caregiver: Patient/Caregiver agrees with plan  GP    Claudine Stallings A. Julanne Schlueter, PT, DPT Acute Rehabilitation Services Office: [place] 03/16/2022, 9:01 AM

## 2022-03-16 NOTE — Evaluation (Signed)
Physical Therapy Evaluation Patient Details Name: Eric [name] MRN: [id number] DOB: [birth year] Today's Date: 03/16/2022  History of Present Illness  Pt is 51 yr old M admitted on 03/13/22 for planned ileostomy reversal, wedge resection of liver x 2 and cholecystectomy.  PMH: Colon CA, COPD  Clinical Impression  Pt was previously mod I with functional mobility and use of rollator for community ambulation.  S/p abdominal sx, pt maintains mod I with use of RW for transfers and ambulation.  Pt is not appropriate for skilled PT in acute care but should continue to work with mobility specialists to maximize function during acute care stay.  Pt is safe to D/C home with assist from brother as needed.  Please reconsult should pt's status change.       Recommendations for follow up therapy are one component of a multi-disciplinary discharge planning process, led by the attending physician.  Recommendations may be updated based on patient status, additional functional criteria and insurance authorization.  Follow Up Recommendations No PT follow up    Assistance Recommended at Discharge    Patient can return home with the following  Help with stairs or ramp for entrance;Assist for transportation    Equipment Recommendations  (Pt has necessary equipment)  Recommendations for Other Services       Functional Status Assessment Patient has had a recent decline in their functional status and demonstrates the ability to make significant improvements in function in a reasonable and predictable amount of time.     Precautions / Restrictions Precautions Precautions: None      Mobility  Bed Mobility Overal bed mobility: Modified Independent             General bed mobility comments: Pt utilizes modified log roll technique with use of bedrail to transition to sitting EOB. Demos good sitting balance. Patient Response: Cooperative  Transfers Overall transfer level: Modified independent Equipment  used: Rolling walker (2 wheels)               General transfer comment: Performs sit > stand without difficulty with 1 hand on RW.  Demos good standing balance.    Ambulation/Gait Ambulation/Gait assistance: Modified independent (Device/Increase time) Gait Distance (Feet): 180 Feet Assistive device: Rolling walker (2 wheels) Gait Pattern/deviations: WFL(Within Functional Limits)          Stairs            Wheelchair Mobility    Modified Rankin (Stroke Patients Only)       Balance Overall balance assessment: Modified Independent                                           Pertinent Vitals/Pain Pain Assessment Pain Assessment: Faces Faces Pain Scale: Hurts a little bit Pain Location: Abdomen Pain Descriptors / Indicators: Aching, Tender Pain Intervention(s): Limited activity within patient's tolerance, Monitored during session, Repositioned    Home Living Family/patient expects to be discharged to:: Private residence Living Arrangements: Other relatives (States brother is permanently moving into home with him) Available Help at Discharge: Family Type of Home: House Home Access: Level entry       Home Layout: One level Home Equipment: Rollator (4 wheels);Cane - single point      Prior Function Prior Level of Function : Independent/Modified Independent             Mobility Comments: Does not use AD in  house, uses 4WW or SPC outside of home       Hand Dominance        Extremity/Trunk Assessment   Upper Extremity Assessment Upper Extremity Assessment: Overall WFL for tasks assessed    Lower Extremity Assessment Lower Extremity Assessment: Overall WFL for tasks assessed    Cervical / Trunk Assessment Cervical / Trunk Assessment: Normal  Communication   Communication: No difficulties  Cognition Arousal/Alertness: Awake/alert Behavior During Therapy: WFL for tasks assessed/performed Overall Cognitive Status: Within  Functional Limits for tasks assessed                                 General Comments: Pt is supine in bed when PT arrives. States pain is much better today.  Agreeable to get OOB and work with.  Needs encouragement to sit in chair.  Alert and oriented x 3.        General Comments      Exercises     Assessment/Plan    PT Assessment Patient does not need any further PT services  PT Problem List         PT Treatment Interventions      PT Goals (Current goals can be found in the Care Plan section)  Acute Rehab PT Goals Patient Stated Goal: Pt's goal is to return home to see cat. PT Goal Formulation: With patient Time For Goal Achievement: 03/30/22 Potential to Achieve Goals: Good    Frequency       Co-evaluation               AM-PAC PT "6 Clicks" Mobility  Outcome Measure Help needed turning from your back to your side while in a flat bed without using bedrails?: A Little Help needed moving from lying on your back to sitting on the side of a flat bed without using bedrails?: A Little Help needed moving to and from a bed to a chair (including a wheelchair)?: None Help needed standing up from a chair using your arms (e.g., wheelchair or bedside chair)?: None Help needed to walk in hospital room?: None Help needed climbing 3-5 steps with a railing? : A Little 6 Click Score: 21    End of Session   Activity Tolerance: Patient tolerated treatment well Patient left: in chair;with call bell/phone within reach;with chair alarm set Nurse Communication: Mobility status PT Visit Diagnosis: Pain;Other abnormalities of gait and mobility (R26.89)    Time: 2440-1027 PT Time Calculation (min) (ACUTE ONLY): 14 min   Charges:   PT Evaluation $PT Eval Low Complexity: 1 Low          Dayton Sherr A. Padraig Nhan, PT, DPT Acute Rehabilitation Services Office: [phone]   Fabian Walder A Terrall Bley 03/16/2022, 8:58 AM

## 2022-03-16 NOTE — Discharge Planning (Signed)
Oncology Discharge Planning Note  [facility] Address: [address] [place], [place], [place] [address] Hours of Operation:  Nena Polio, Monday - Friday  Clinic Contact Information:  [phone]  Oncology Care Team: Medical Oncologist:  Dr. [name]  Patient Details: Name:  Eric [name], Eric [name] MRN:   [id number] DOB:   05-20-1971 Reason for Current Admission: Colon cancer metastasized to liver [facility])  Discharge Planning Narrative: Office was aware of planned admission  for Eric [name].  Discharge follow-up appointments for oncology are current and available on the AVS and MyChart.   Upon discharge from the hospital, hematology/oncology's post discharge plan of care for the outpatient setting is: Follow up as scheduled on 04/02/22   Eric [name] will be called within two business days after discharge to review hematology/oncology's plan of care for full understanding.    Outpatient Oncology Specific Care Only: Oncology appointment transportation needs addressed?:  yes. He has transportation Oncology medication management for symptom management addressed?:  not applicable Chemo Alert Card reviewed?:  no Immunotherapy Alert Card reviewed?:  not applicable

## 2022-03-16 NOTE — Discharge Instructions (Signed)
PER MEDICAL ONCOLOGY: Follow up as scheduled on 04/02/22 at 11:45 with Ned Card, NP.

## 2022-03-17 LAB — TYPE AND SCREEN
ABO/RH(D): A POS
Antibody Screen: NEGATIVE
Unit division: 0
Unit division: 0
Unit division: 0
Unit division: 0

## 2022-03-17 LAB — BPAM RBC
Blood Product Expiration Date: 202306262359
Blood Product Expiration Date: 202306262359
Blood Product Expiration Date: 202306262359
Blood Product Expiration Date: 202306262359
ISSUE DATE / TIME: 202306051502
ISSUE DATE / TIME: 202306052046
Unit Type and Rh: 6200
Unit Type and Rh: 6200
Unit Type and Rh: 6200
Unit Type and Rh: 6200

## 2022-03-17 IMAGING — DX DG ABDOMEN 1V
1 series · 1 of 1 positions shown · non-contrast
Comparison: CT AP 11/25/2020

CLINICAL DATA: NG tube placement.

EXAM:
ABDOMEN - 1 VIEW

[abdomen]
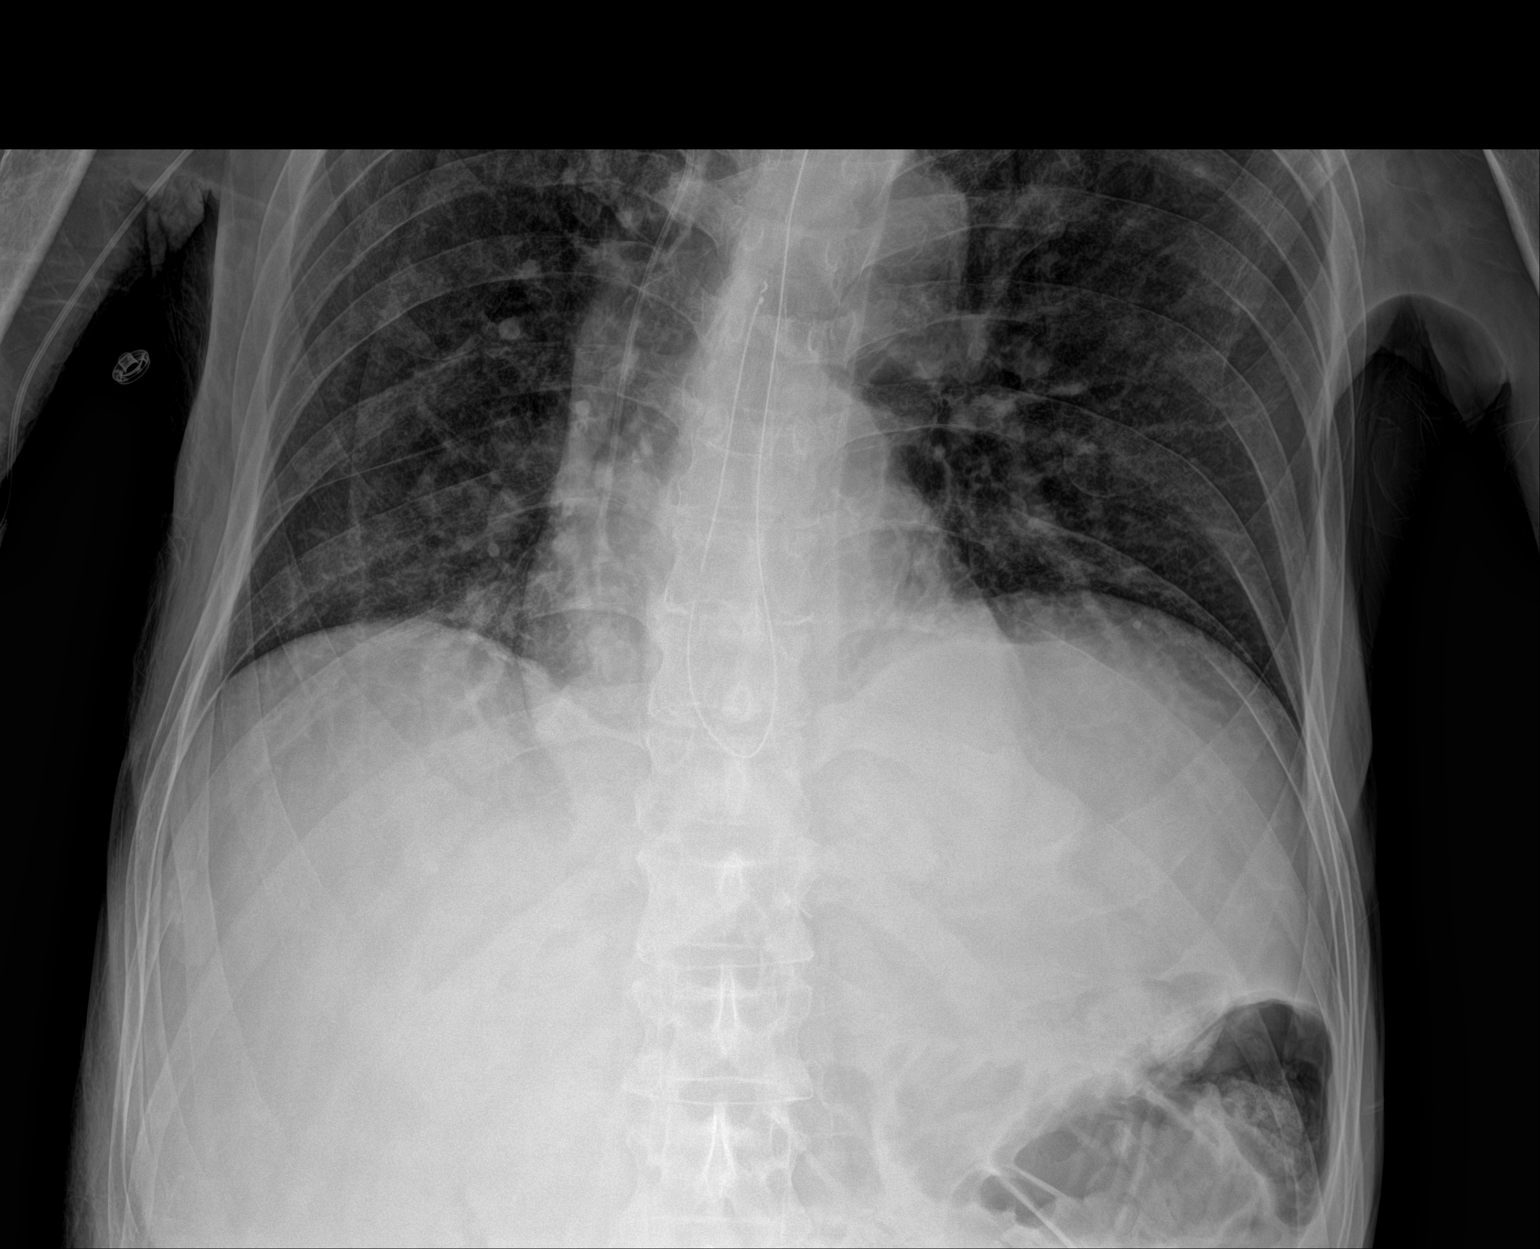

[1 of 1 positions shown; findings below may reference images not displayed]

FINDINGS: Right arm PICC line tip is at the cavoatrial junction. The
nasogastric tube is looped within the distal half of the esophagus.
The distal end of the NG tube is oriented cranially with tip at the
level of the carina.
IMPRESSION: The nasogastric tube is looped within the distal half of the
esophagus. The distal end of the NG tube is oriented cranially with
tip at the level of the carina. Advise repositioning.

## 2022-03-17 IMAGING — DX DG ABDOMEN 1V
1 series · 1 of 1 positions shown · non-contrast
Comparison: December 01, 2020

CLINICAL DATA: Evaluate NG tube

EXAM:
ABDOMEN - 1 VIEW

[abdomen]
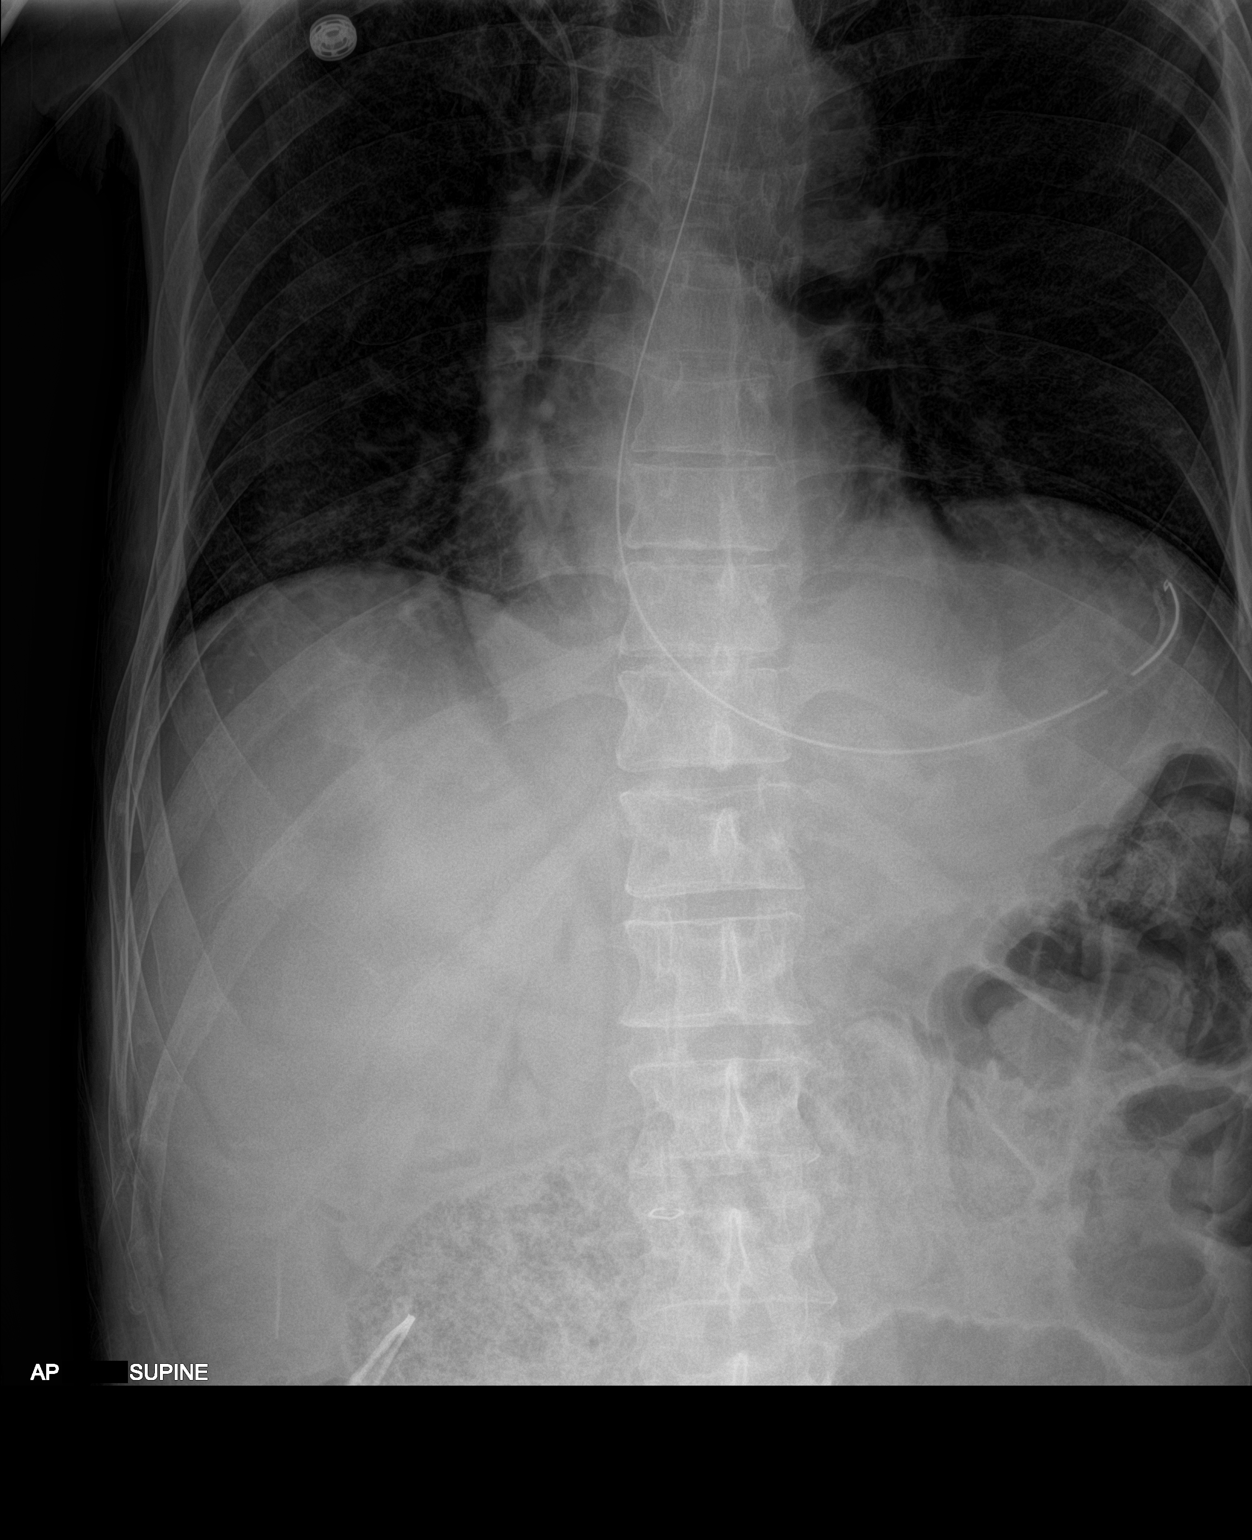

[1 of 1 positions shown; findings below may reference images not displayed]

FINDINGS: The NG tube is been reposition in the interval now terminates in the
stomach.
IMPRESSION: The NG tube has been reposition in the interval and now terminates
in the stomach.

## 2022-03-17 MED ORDER — HYDROMORPHONE HCL 1 MG/ML IJ SOLN
1.0000 mg | INTRAMUSCULAR | Status: DC | PRN
  Administered 2022-03-17 – 2022-03-18 (×4): 1 mg via INTRAVENOUS
  Filled 2022-03-17 (×4): qty 1

## 2022-03-17 NOTE — Progress Notes (Signed)
Mobility Specialist Progress Note:   03/17/22 1000  Mobility  Activity Ambulated with assistance in hallway  Level of Assistance Standby assist, set-up cues, supervision of patient - no hands on  Assistive Device Front wheel walker  Distance Ambulated (ft) 130 ft  Activity Response Tolerated fair  $Mobility charge 1 Mobility   Pt agreeable to mobility session after minor encouragement. Pt still c/o upper abdominal pain, increasing with activity. Distance limited secondary to pain. Pt back in bed with all needs met.   Nelta Numbers Acute Rehab Secure Chat or Office Phone: [phone]

## 2022-03-17 NOTE — Progress Notes (Signed)
    4 Days Post-Op  Subjective: Patient reports 2 episodes of emesis last night. Mild nausea this morning with frequent belching. Passing small amounts of flatus. Ambulated in hallways yesterday.   Objective: Vital signs in last 24 hours: Temp:  [98.1 F (36.7 C)-99.6 F (37.6 C)] 98.8 F (37.1 C) (06/06 0546) Pulse Rate:  [102-117] 107 (06/06 0546) Resp:  [16-20] 16 (06/06 0546) BP: (127-159)/(92-106) 144/92 (06/06 0546) SpO2:  [92 %-95 %] 94 % (06/06 0546) Weight:  [73.8 kg] 73.8 kg (06/06 0546) Last BM Date : 03/13/22  Intake/Output from previous day: No intake/output data recorded. Intake/Output this shift: No intake/output data recorded.  PE: General: resting comfortably, NAD Neuro: alert and oriented, no focal deficits Resp: normal work of breathing on room air Abdomen: soft, mildly distended, appropriately tender. Midline incision is clean and dry with staples in place, small amount of erythema at inferior aspect. RLQ prior ostomy site is clean and dry. Extremities: warm and well-perfused   Lab Results:  Recent Labs    03/15/22 0128 03/16/22 0359  WBC 22.8* 14.0*  HGB 9.6* 8.8*  HCT 29.1* 26.7*  PLT 197 198   BMET Recent Labs    03/15/22 0128 03/16/22 0359  NA 133* 136  K 4.1 4.1  CL 102 103  CO2 25 26  GLUCOSE 119* 121*  BUN 14 17  CREATININE 0.98 1.04  CALCIUM 8.4* 8.5*   PT/INR No results for input(s): LABPROT, INR in the last 72 hours. CMP     Component Value Date/Time   NA 136 03/16/2022 0359   K 4.1 03/16/2022 0359   CL 103 03/16/2022 0359   CO2 26 03/16/2022 0359   GLUCOSE 121 (H) 03/16/2022 0359   BUN 17 03/16/2022 0359   CREATININE 1.04 03/16/2022 0359   CREATININE 1.18 02/20/2022 1117   CALCIUM 8.5 (L) 03/16/2022 0359   PROT 6.1 (L) 03/16/2022 0359   ALBUMIN 2.5 (L) 03/16/2022 0359   AST 47 (H) 03/16/2022 0359   AST 18 02/20/2022 1117   ALT 91 (H) 03/16/2022 0359   ALT 12 02/20/2022 1117   ALKPHOS 98 03/16/2022 0359    BILITOT 1.4 (H) 03/16/2022 0359   BILITOT 0.6 02/20/2022 1117   GFRNONAA >60 03/16/2022 0359   GFRNONAA >60 02/20/2022 1117   Lipase  No results found for: LIPASE     Studies/Results: No results found.      Assessment/Plan 51 yo male with stage IV colon cancer with liver metastases, POD4 s/p open hepatic wedge resection x2 and ileostomy reversal.  - Symptoms consistent with mild ileus. Change to clear liquid diet. If patient has further emesis, will likely need NG tube. - Multimodal pain control: scheduled tylenol, robaxin, toradol and gabapentin. PRN oxycodone and dilaudid. - Tachycardia: improved, HR remains in low 100s. - Ambulate, pulmonary toilet, IS - COPD: Currently on room air. Continue home Breo ellipta, prn albuterol. Aggressive pulmonary toilet. - VTE: lovenox, SCDs - Dispo: inpatient, med-surg floor    LOS: 4 days    Michaelle Birks, MD [facility] Surgery General, Hepatobiliary and Pancreatic Surgery 03/17/22 7:25 AM

## 2022-03-18 ENCOUNTER — Inpatient Hospital Stay (HOSPITAL_COMMUNITY): Payer: 59

## 2022-03-18 DIAGNOSIS — K567 Ileus, unspecified: Secondary | ICD-10-CM | POA: Diagnosis not present

## 2022-03-18 LAB — CBC
HCT: 27.4 % — ABNORMAL LOW (ref 39.0–52.0)
Hemoglobin: 9.2 g/dL — ABNORMAL LOW (ref 13.0–17.0)
MCH: 30.4 pg (ref 26.0–34.0)
MCHC: 33.6 g/dL (ref 30.0–36.0)
MCV: 90.4 fL (ref 80.0–100.0)
Platelets: 384 10*3/uL (ref 150–400)
RBC: 3.03 MIL/uL — ABNORMAL LOW (ref 4.22–5.81)
RDW: 15.7 % — ABNORMAL HIGH (ref 11.5–15.5)
WBC: 17.8 10*3/uL — ABNORMAL HIGH (ref 4.0–10.5)
nRBC: 0 % (ref 0.0–0.2)

## 2022-03-18 LAB — COMPREHENSIVE METABOLIC PANEL
ALT: 66 U/L — ABNORMAL HIGH (ref 0–44)
AST: 43 U/L — ABNORMAL HIGH (ref 15–41)
Albumin: 2.5 g/dL — ABNORMAL LOW (ref 3.5–5.0)
Alkaline Phosphatase: 306 U/L — ABNORMAL HIGH (ref 38–126)
Anion gap: 11 (ref 5–15)
BUN: 26 mg/dL — ABNORMAL HIGH (ref 6–20)
CO2: 24 mmol/L (ref 22–32)
Calcium: 8.2 mg/dL — ABNORMAL LOW (ref 8.9–10.3)
Chloride: 100 mmol/L (ref 98–111)
Creatinine, Ser: 0.98 mg/dL (ref 0.61–1.24)
GFR, Estimated: >60 mL/min (ref >60)
Glucose, Bld: 123 mg/dL — ABNORMAL HIGH (ref 70–99)
Potassium: 3.9 mmol/L (ref 3.5–5.1)
Sodium: 135 mmol/L (ref 135–145)
Total Bilirubin: 1.5 mg/dL — ABNORMAL HIGH (ref 0.3–1.2)
Total Protein: 6.4 g/dL — ABNORMAL LOW (ref 6.5–8.1)

## 2022-03-18 MED ORDER — DEXTROSE 5 % IV SOLN
1000.0000 mg | Freq: Three times a day (TID) | INTRAVENOUS | Status: DC
Start: 2022-03-18 — End: 2022-03-30
  Administered 2022-03-18 – 2022-03-30 (×35): 1000 mg via INTRAVENOUS
  Filled 2022-03-18 (×2): qty 10
  Filled 2022-03-18 (×2): qty 1000
  Filled 2022-03-18 (×6): qty 10
  Filled 2022-03-18 (×2): qty 1000
  Filled 2022-03-18 (×5): qty 10
  Filled 2022-03-18 (×2): qty 1000
  Filled 2022-03-18 (×8): qty 10
  Filled 2022-03-18 (×2): qty 1000
  Filled 2022-03-18 (×10): qty 10
  Filled 2022-03-18: qty 1000

## 2022-03-18 MED ORDER — LACTATED RINGERS IV SOLN: INTRAVENOUS | Status: AC

## 2022-03-18 MED ORDER — LACTATED RINGERS IV BOLUS
500.0000 mL | Freq: Once | INTRAVENOUS | Status: AC
  Administered 2022-03-18: 500 mL via INTRAVENOUS

## 2022-03-18 MED ORDER — PANTOPRAZOLE SODIUM 40 MG IV SOLR
40.0000 mg | Freq: Every day | INTRAVENOUS | Status: DC
Start: 2022-03-19 — End: 2022-03-30
  Administered 2022-03-19 – 2022-03-29 (×11): 40 mg via INTRAVENOUS
  Filled 2022-03-18 (×11): qty 10

## 2022-03-18 MED ORDER — HYDROMORPHONE HCL 1 MG/ML IJ SOLN
1.0000 mg | INTRAMUSCULAR | Status: DC | PRN
  Administered 2022-03-18 – 2022-03-21 (×20): 1 mg via INTRAVENOUS
  Filled 2022-03-18 (×22): qty 1

## 2022-03-18 MED ORDER — METOCLOPRAMIDE HCL 5 MG/ML IJ SOLN
10.0000 mg | Freq: Four times a day (QID) | INTRAMUSCULAR | Status: DC
  Administered 2022-03-18: 10 mg via INTRAVENOUS

## 2022-03-18 MED ORDER — METOCLOPRAMIDE HCL 5 MG/ML IJ SOLN
10.0000 mg | Freq: Four times a day (QID) | INTRAMUSCULAR | Status: DC
Start: 2022-03-18 — End: 2022-03-18
  Filled 2022-03-18: qty 2

## 2022-03-18 NOTE — Progress Notes (Signed)
    5 Days Post-Op  Subjective: Patient continues to have intermittent emesis and reports significant pain. Frequent belching and hiccups. BUN elevated this morning. WBC up to 17. Having loose bowel movements.   Objective: Vital signs in last 24 hours: Temp:  [98 F (36.7 C)-98.5 F (36.9 C)] 98.4 F (36.9 C) (06/07 0735) Pulse Rate:  [105-110] 110 (06/07 0735) Resp:  [17-18] 17 (06/07 0735) BP: (142-163)/(98-109) 161/98 (06/07 0735) SpO2:  [92 %-96 %] 93 % (06/07 0735) Weight:  [75.7 kg] 75.7 kg (06/06 0944) Last BM Date : 03/17/22  Intake/Output from previous day: 06/06 0701 - 06/07 0700 In: -  Out: 150 [Urine:150] Intake/Output this shift: No intake/output data recorded.  PE: General: resting comfortably, NAD Neuro: alert and oriented, no focal deficits Resp: normal work of breathing on room air Abdomen: soft, mildly distended, appropriately tender to palpation. Midline incision is clean and dry with staples in place. RLQ prior ostomy site is clean and dry. Extremities: warm and well-perfused   Lab Results:  Recent Labs    03/16/22 0359 03/18/22 0440  WBC 14.0* 17.8*  HGB 8.8* 9.2*  HCT 26.7* 27.4*  PLT 198 384   BMET Recent Labs    03/16/22 0359 03/18/22 0440  NA 136 135  K 4.1 3.9  CL 103 100  CO2 26 24  GLUCOSE 121* 123*  BUN 17 26*  CREATININE 1.04 0.98  CALCIUM 8.5* 8.2*   PT/INR No results for input(s): LABPROT, INR in the last 72 hours. CMP     Component Value Date/Time   NA 135 03/18/2022 0440   K 3.9 03/18/2022 0440   CL 100 03/18/2022 0440   CO2 24 03/18/2022 0440   GLUCOSE 123 (H) 03/18/2022 0440   BUN 26 (H) 03/18/2022 0440   CREATININE 0.98 03/18/2022 0440   CREATININE 1.18 02/20/2022 1117   CALCIUM 8.2 (L) 03/18/2022 0440   PROT 6.4 (L) 03/18/2022 0440   ALBUMIN 2.5 (L) 03/18/2022 0440   AST 43 (H) 03/18/2022 0440   AST 18 02/20/2022 1117   ALT 66 (H) 03/18/2022 0440   ALT 12 02/20/2022 1117   ALKPHOS 306 (H) 03/18/2022  0440   BILITOT 1.5 (H) 03/18/2022 0440   BILITOT 0.6 02/20/2022 1117   GFRNONAA >60 03/18/2022 0440   GFRNONAA >60 02/20/2022 1117   Lipase  No results found for: LIPASE     Studies/Results: No results found.      Assessment/Plan 51 yo male with stage IV colon cancer with liver metastases, POD5 s/p open hepatic wedge resection x2 and ileostomy reversal.  - Patient is having bowel movements but remains distended with vomiting, consistent with ongoing ileus. KUB pending this morning, if there is gastric or small bowel distension will place NG tube. - NPO except ice chips - Labs consistent with volume depletion (all cell lines elevated on CBC, elevated BUN) - give 500 cc bolus and resume IV fluids at 50 ml/hr. - Multimodal pain control: scheduled tylenol, robaxin, toradol and gabapentin. PRN oxycodone and dilaudid. - Ambulate, pulmonary toilet, IS - COPD: Currently on room air. Continue home Breo ellipta, prn albuterol. Aggressive pulmonary toilet. - VTE: lovenox, SCDs - Dispo: inpatient, med-surg floor    LOS: 5 days    Michaelle Birks, MD [facility] Surgery General, Hepatobiliary and Pancreatic Surgery 03/18/22 7:48 AM

## 2022-03-19 ENCOUNTER — Inpatient Hospital Stay (HOSPITAL_COMMUNITY): Payer: 59

## 2022-03-19 LAB — BASIC METABOLIC PANEL
Anion gap: 7 (ref 5–15)
BUN: 20 mg/dL (ref 6–20)
CO2: 25 mmol/L (ref 22–32)
Calcium: 8 mg/dL — ABNORMAL LOW (ref 8.9–10.3)
Chloride: 104 mmol/L (ref 98–111)
Creatinine, Ser: 0.85 mg/dL (ref 0.61–1.24)
GFR, Estimated: >60 mL/min (ref >60)
Glucose, Bld: 112 mg/dL — ABNORMAL HIGH (ref 70–99)
Potassium: 3.5 mmol/L (ref 3.5–5.1)
Sodium: 136 mmol/L (ref 135–145)

## 2022-03-19 LAB — CBC
HCT: 26.3 % — ABNORMAL LOW (ref 39.0–52.0)
Hemoglobin: 8.5 g/dL — ABNORMAL LOW (ref 13.0–17.0)
MCH: 29.7 pg (ref 26.0–34.0)
MCHC: 32.3 g/dL (ref 30.0–36.0)
MCV: 92 fL (ref 80.0–100.0)
Platelets: 366 10*3/uL (ref 150–400)
RBC: 2.86 MIL/uL — ABNORMAL LOW (ref 4.22–5.81)
RDW: 15.9 % — ABNORMAL HIGH (ref 11.5–15.5)
WBC: 16.6 10*3/uL — ABNORMAL HIGH (ref 4.0–10.5)
nRBC: 0 % (ref 0.0–0.2)

## 2022-03-19 MED ORDER — LACTATED RINGERS IV BOLUS
1000.0000 mL | Freq: Once | INTRAVENOUS | Status: AC
Start: 2022-03-19 — End: 2022-03-19
  Administered 2022-03-19: 1000 mL via INTRAVENOUS

## 2022-03-19 MED ORDER — IOHEXOL 9 MG/ML PO SOLN
ORAL | Status: AC
  Filled 2022-03-19: qty 1000

## 2022-03-19 MED ORDER — IOHEXOL 350 MG/ML SOLN
100.0000 mL | Freq: Once | INTRAVENOUS | Status: AC | PRN
Start: 2022-03-19 — End: 2022-03-19
  Administered 2022-03-19: 100 mL via INTRAVENOUS

## 2022-03-19 NOTE — Progress Notes (Signed)
Mobility Specialist Progress Note:   03/19/22 1055  Mobility  Activity Stood at bedside  Level of Assistance Standby assist, set-up cues, supervision of patient - no hands on  Assistive Device Front wheel walker  Distance Ambulated (ft) 2 ft  Activity Response Tolerated poorly  $Mobility charge 1 Mobility   Pt agreeable to mobility session this am despite recurrent nausea/pain. Upon standing, pt took 2 steps then had to abruptly sit back down stating "I cant do it" d/t nausea/SOB. SpO2 77% on RA, pt placed on 3LO2 [place], SpO2 81%. RN notified. Pt left in bed with all needs met, discouraged he couldn't participate more.   Nelta Numbers Acute Rehab Secure Chat or Office Phone: [phone]

## 2022-03-19 NOTE — Plan of Care (Signed)
  Problem: Pain Managment: Goal: General experience of comfort will improve Outcome: Progressing   Problem: Safety: Goal: Ability to remain free from injury will improve Outcome: Progressing   Problem: Nutrition: Goal: Adequate nutrition will be maintained Outcome: Not Progressing   

## 2022-03-19 NOTE — Progress Notes (Signed)
6 Days Post-Op  Subjective: NG tube placed yesterday, patient feels a little better today. Total output of 2 liters. He is still passing flatus. Reports ongoing abdominal pain. WBC 16 today (from 17).   Objective: Vital signs in last 24 hours: Temp:  [97.5 F (36.4 C)-98.4 F (36.9 C)] 98.2 F (36.8 C) (06/08 0432) Pulse Rate:  [107-123] 110 (06/08 0455) Resp:  [17-24] 19 (06/08 0455) BP: (140-161)/(96-99) 149/99 (06/08 0455) SpO2:  [86 %-94 %] 94 % (06/08 0455) Weight:  [76.1 kg] 76.1 kg (06/08 0415) Last BM Date : 03/18/22  Intake/Output from previous day: 06/07 0701 - 06/08 0700 In: 1503.4 [P.O.:30; I.V.:1473.4] Out: 2650 [Urine:600; Emesis/NG output:2050] Intake/Output this shift: No intake/output data recorded.  PE: General: resting comfortably, NAD Neuro: alert and oriented, no focal deficits HEENT: NG in place draining gastric contents Resp: normal work of breathing on room air Abdomen: soft, distension improved, appropriately tender to palpation. Midline incision has a small amount of serous drainage from the inferior aspect. RLQ prior ostomy site is clean and dry. Extremities: warm and well-perfused   Lab Results:  Recent Labs    03/18/22 0440 03/19/22 0439  WBC 17.8* 16.6*  HGB 9.2* 8.5*  HCT 27.4* 26.3*  PLT 384 366   BMET Recent Labs    03/18/22 0440 03/19/22 0439  NA 135 136  K 3.9 3.5  CL 100 104  CO2 24 25  GLUCOSE 123* 112*  BUN 26* 20  CREATININE 0.98 0.85  CALCIUM 8.2* 8.0*   PT/INR No results for input(s): "LABPROT", "INR" in the last 72 hours. CMP     Component Value Date/Time   NA 136 03/19/2022 0439   K 3.5 03/19/2022 0439   CL 104 03/19/2022 0439   CO2 25 03/19/2022 0439   GLUCOSE 112 (H) 03/19/2022 0439   BUN 20 03/19/2022 0439   CREATININE 0.85 03/19/2022 0439   CREATININE 1.18 02/20/2022 1117   CALCIUM 8.0 (L) 03/19/2022 0439   PROT 6.4 (L) 03/18/2022 0440   ALBUMIN 2.5 (L) 03/18/2022 0440   AST 43 (H)  03/18/2022 0440   AST 18 02/20/2022 1117   ALT 66 (H) 03/18/2022 0440   ALT 12 02/20/2022 1117   ALKPHOS 306 (H) 03/18/2022 0440   BILITOT 1.5 (H) 03/18/2022 0440   BILITOT 0.6 02/20/2022 1117   GFRNONAA >60 03/19/2022 0439   GFRNONAA >60 02/20/2022 1117   Lipase  No results found for: "LIPASE"     Studies/Results: DG Abd Portable 1V  Result Date: 03/18/2022 CLINICAL DATA:  Nasogastric tube placement.  Recent bowel surgery. EXAM: PORTABLE ABDOMEN - 1 VIEW COMPARISON:  KUB 03/18/2022 at 1057 hours FINDINGS: KUB 03/18/2022 at 1144 hours. Enteric tube has been repositioned and now extends below the diaphragm with the tip overlying the gastric air bubble on the side port just distal to the gastroesophageal junction. Surgical clips again overlie the right upper abdominal quadrant. There are midline surgical skin staples. Air is seen within mildly distended nondistended loops of small and large bowel, unchanged. IMPRESSION: 1. Nasogastric tube now in appropriate position. 2. Unchanged likely postoperative ileus. Electronically Signed   By: Yvonne Kendall M.D.   On: 03/18/2022 11:55   DG Abd Portable 1V  Result Date: 03/18/2022 CLINICAL DATA:  NG tube placement. EXAM: PORTABLE ABDOMEN - 1 VIEW COMPARISON:  Earlier film, same date. FINDINGS: The NG tube is kinked back on itself in the mid esophagus and needs to be repositioned. IMPRESSION: NG tube is kinked back  on itself in the mid esophagus and needs to be repositioned. Electronically Signed   By: Marijo Sanes M.D.   On: 03/18/2022 11:18   DG Abd 2 Views  Result Date: 03/18/2022 CLINICAL DATA:  Abdominal pain and distension after bowel surgery EXAM: ABDOMEN - 2 VIEW COMPARISON:  06/09/2021 FINDINGS: Gaseous distension of numerous large and small bowel loops throughout the abdomen with small bowel dilatation up to 4.3 cm. No gross free intraperitoneal air. Midline surgical staples. Numerous surgical clips in the right hemiabdomen. IMPRESSION:  Gaseous distension of numerous large and small bowel loops throughout the abdomen with small bowel dilatation up to 4.3 cm. Appearance favors postoperative ileus. Continued radiographic surveillance recommended to exclude obstruction. Electronically Signed   By: Davina Poke D.O.   On: 03/18/2022 08:32        Assessment/Plan 51 yo male with stage IV colon cancer with liver metastases, POD6 s/p open hepatic wedge resection x2 and ileostomy reversal.  - Postoperative ileus: continue NG to LIS. If no improvement tomorrow, plan for CT scan. Will also need to consider TPN. - NPO except ice chips - Multimodal pain control: scheduled IV robaxin, prn dilaudid. - Ambulate, pulmonary toilet, IS - COPD: Currently on room air. Continue home Breo ellipta, prn albuterol. Aggressive pulmonary toilet. - Surg path showed no malignancy in hepatic wedge resections, indicating necrotic tumor. - VTE: lovenox, SCDs - Dispo: inpatient, med-surg floor    LOS: 6 days    Michaelle Birks, MD [facility] Surgery General, Hepatobiliary and Pancreatic Surgery 03/19/22 7:02 AM

## 2022-03-20 ENCOUNTER — Inpatient Hospital Stay: Payer: Self-pay

## 2022-03-20 LAB — COMPREHENSIVE METABOLIC PANEL
ALT: 39 U/L (ref 0–44)
AST: 21 U/L (ref 15–41)
Albumin: 2.3 g/dL — ABNORMAL LOW (ref 3.5–5.0)
Alkaline Phosphatase: 211 U/L — ABNORMAL HIGH (ref 38–126)
Anion gap: 8 (ref 5–15)
BUN: 13 mg/dL (ref 6–20)
CO2: 25 mmol/L (ref 22–32)
Calcium: 7.8 mg/dL — ABNORMAL LOW (ref 8.9–10.3)
Chloride: 101 mmol/L (ref 98–111)
Creatinine, Ser: 0.68 mg/dL (ref 0.61–1.24)
GFR, Estimated: >60 mL/min (ref >60)
Glucose, Bld: 98 mg/dL (ref 70–99)
Potassium: 3.2 mmol/L — ABNORMAL LOW (ref 3.5–5.1)
Sodium: 134 mmol/L — ABNORMAL LOW (ref 135–145)
Total Bilirubin: 0.8 mg/dL (ref 0.3–1.2)
Total Protein: 5.8 g/dL — ABNORMAL LOW (ref 6.5–8.1)

## 2022-03-20 LAB — GLUCOSE, CAPILLARY
Glucose-Capillary: 121 mg/dL — ABNORMAL HIGH (ref 70–99)
Glucose-Capillary: 88 mg/dL (ref 70–99)

## 2022-03-20 LAB — MAGNESIUM: Magnesium: 2.2 mg/dL (ref 1.7–2.4)

## 2022-03-20 LAB — CBC
HCT: 25.5 % — ABNORMAL LOW (ref 39.0–52.0)
Hemoglobin: 8.4 g/dL — ABNORMAL LOW (ref 13.0–17.0)
MCH: 30.3 pg (ref 26.0–34.0)
MCHC: 32.9 g/dL (ref 30.0–36.0)
MCV: 92.1 fL (ref 80.0–100.0)
Platelets: 294 10*3/uL (ref 150–400)
RBC: 2.77 MIL/uL — ABNORMAL LOW (ref 4.22–5.81)
RDW: 15.7 % — ABNORMAL HIGH (ref 11.5–15.5)
WBC: 13.8 10*3/uL — ABNORMAL HIGH (ref 4.0–10.5)
nRBC: 0 % (ref 0.0–0.2)

## 2022-03-20 LAB — CREATININE, SERUM
Creatinine, Ser: 0.71 mg/dL (ref 0.61–1.24)
GFR, Estimated: >60 mL/min (ref >60)

## 2022-03-20 LAB — TRIGLYCERIDES: Triglycerides: 208 mg/dL — ABNORMAL HIGH (ref <150)

## 2022-03-20 LAB — PHOSPHORUS: Phosphorus: 3 mg/dL (ref 2.5–4.6)

## 2022-03-20 MED ORDER — KETOROLAC TROMETHAMINE 15 MG/ML IJ SOLN
15.0000 mg | Freq: Three times a day (TID) | INTRAMUSCULAR | Status: AC
  Administered 2022-03-20 – 2022-03-25 (×15): 15 mg via INTRAVENOUS
  Filled 2022-03-20 (×15): qty 1

## 2022-03-20 MED ORDER — TRAVASOL 10 % IV SOLN
INTRAVENOUS | Status: AC
  Filled 2022-03-20: qty 393.6

## 2022-03-20 MED ORDER — INSULIN ASPART 100 UNIT/ML IJ SOLN
0.0000 [IU] | Freq: Four times a day (QID) | INTRAMUSCULAR | Status: DC
  Administered 2022-03-20 – 2022-03-22 (×4): 1 [IU] via SUBCUTANEOUS
  Administered 2022-03-22: 2 [IU] via SUBCUTANEOUS
  Administered 2022-03-22: 1 [IU] via SUBCUTANEOUS
  Administered 2022-03-23: 2 [IU] via SUBCUTANEOUS
  Administered 2022-03-23 (×3): 1 [IU] via SUBCUTANEOUS
  Administered 2022-03-24 (×4): 2 [IU] via SUBCUTANEOUS
  Administered 2022-03-25 – 2022-03-26 (×5): 1 [IU] via SUBCUTANEOUS
  Administered 2022-03-26: 2 [IU] via SUBCUTANEOUS
  Administered 2022-03-26 (×2): 1 [IU] via SUBCUTANEOUS
  Administered 2022-03-27 (×2): 2 [IU] via SUBCUTANEOUS

## 2022-03-20 MED ORDER — SODIUM CHLORIDE 0.9% FLUSH
10.0000 mL | INTRAVENOUS | Status: DC | PRN
  Administered 2022-03-23 – 2022-04-02 (×3): 10 mL

## 2022-03-20 MED ORDER — POTASSIUM CHLORIDE 10 MEQ/100ML IV SOLN
10.0000 meq | INTRAVENOUS | Status: AC
  Administered 2022-03-20: 10 meq via INTRAVENOUS
  Filled 2022-03-20 (×2): qty 100

## 2022-03-20 MED ORDER — POTASSIUM CHLORIDE 10 MEQ/100ML IV SOLN
10.0000 meq | INTRAVENOUS | Status: AC
  Administered 2022-03-20 (×4): 10 meq via INTRAVENOUS
  Filled 2022-03-20 (×3): qty 100

## 2022-03-20 MED ORDER — LACTATED RINGERS IV SOLN: INTRAVENOUS | Status: AC

## 2022-03-20 MED ORDER — SODIUM CHLORIDE 0.9% FLUSH
10.0000 mL | Freq: Two times a day (BID) | INTRAVENOUS | Status: DC
  Administered 2022-03-20 – 2022-03-31 (×17): 10 mL
  Administered 2022-03-31: 20 mL
  Administered 2022-04-01 – 2022-04-02 (×4): 10 mL

## 2022-03-20 MED ORDER — POTASSIUM CHLORIDE 10 MEQ/100ML IV SOLN
10.0000 meq | Freq: Once | INTRAVENOUS | Status: AC
Start: 2022-03-20 — End: 2022-03-21
  Administered 2022-03-20: 10 meq via INTRAVENOUS
  Filled 2022-03-20: qty 100

## 2022-03-20 MED ORDER — KETOROLAC TROMETHAMINE 15 MG/ML IJ SOLN
15.0000 mg | Freq: Once | INTRAMUSCULAR | Status: AC
Start: 2022-03-20 — End: 2022-03-20
  Administered 2022-03-20: 15 mg via INTRAVENOUS
  Filled 2022-03-20: qty 1

## 2022-03-20 NOTE — Progress Notes (Signed)
Mobility Specialist Progress Note:   03/20/22 1500  Mobility  Activity  (bed level exercises)  Range of Motion/Exercises Active;Right leg;Left leg  Level of Assistance Independent  Assistive Device None  Activity Response Tolerated fair  $Mobility charge 1 Mobility   Pt eager to attempt OOB mobility at this time. However pt constantly leaking BM, even through depends. Opted for bed level exercises, pt tolerated well. Left with all needs met. RN in to perform pericare.    Acute Rehab Secure Chat or Office Phone: [phone]  

## 2022-03-20 NOTE — Progress Notes (Signed)
Mobility Specialist Progress Note:   03/20/22 1120  Mobility  Activity  (bed level exercises)  Range of Motion/Exercises Active;Right leg;Left leg  Level of Assistance Independent  Assistive Device None  Activity Response Tolerated well  $Mobility charge 1 Mobility   Pt eager for mobility session however limited by nausea and diarrhea. Pt c/o not feeling good today; agreeable to bed level exercises. Tolerated well, minor abdominal discomfort. Pt left with all needs met, will f/u later today for possible OOB mobility if schedule permits, pt eager.   Nelta Numbers Acute Rehab Secure Chat or Office Phone: [phone]

## 2022-03-20 NOTE — Progress Notes (Signed)
7 Days Post-Op  Subjective: NG output remains high. CT yesterday negative for PE, no intraabdominal fluid collections. There are small pleural effusions and atelectasis. Patient is having loose bowel movements but reports intermittent nausea.   Objective: Vital signs in last 24 hours: Temp:  [98.4 F (36.9 C)-99.6 F (37.6 C)] 98.4 F (36.9 C) (06/09 0518) Pulse Rate:  [98-109] 98 (06/09 0518) Resp:  [17-20] 17 (06/09 0518) BP: (139-159)/(87-104) 159/99 (06/09 0518) SpO2:  [90 %-93 %] 91 % (06/09 0518) Weight:  [74.9 kg] 74.9 kg (06/08 0833) Last BM Date : 03/19/22  Intake/Output from previous day: 06/08 0701 - 06/09 0700 In: 1758.5 [I.V.:1758.5] Out: 1900 [Urine:700; Emesis/NG output:1200] Intake/Output this shift: Total I/O In: 1758.5 [I.V.:1758.5] Out: 500 [Emesis/NG output:500]  PE: General: resting comfortably, NAD Neuro: alert and oriented, no focal deficits HEENT: NG in place draining gastric contents Resp: normal work of breathing on room air Abdomen: soft, nondistended, nontender. Midline incision is clean and dry. RLQ prior ostomy site is draining serosanguinous fluid. Extremities: warm and well-perfused   Lab Results:  Recent Labs    03/18/22 0440 03/19/22 0439  WBC 17.8* 16.6*  HGB 9.2* 8.5*  HCT 27.4* 26.3*  PLT 384 366   BMET Recent Labs    03/18/22 0440 03/19/22 0439 03/20/22 0332  NA 135 136  --   K 3.9 3.5  --   CL 100 104  --   CO2 24 25  --   GLUCOSE 123* 112*  --   BUN 26* 20  --   CREATININE 0.98 0.85 0.71  CALCIUM 8.2* 8.0*  --    PT/INR No results for input(s): "LABPROT", "INR" in the last 72 hours. CMP     Component Value Date/Time   NA 136 03/19/2022 0439   K 3.5 03/19/2022 0439   CL 104 03/19/2022 0439   CO2 25 03/19/2022 0439   GLUCOSE 112 (H) 03/19/2022 0439   BUN 20 03/19/2022 0439   CREATININE 0.71 03/20/2022 0332   CREATININE 1.18 02/20/2022 1117   CALCIUM 8.0 (L) 03/19/2022 0439   PROT 6.4 (L) 03/18/2022  0440   ALBUMIN 2.5 (L) 03/18/2022 0440   AST 43 (H) 03/18/2022 0440   AST 18 02/20/2022 1117   ALT 66 (H) 03/18/2022 0440   ALT 12 02/20/2022 1117   ALKPHOS 306 (H) 03/18/2022 0440   BILITOT 1.5 (H) 03/18/2022 0440   BILITOT 0.6 02/20/2022 1117   GFRNONAA >60 03/20/2022 0332   GFRNONAA >60 02/20/2022 1117   Lipase  No results found for: "LIPASE"     Studies/Results: [place] EKG SITE RITE  Result Date: 03/20/2022 If Site Rite image not attached, placement could not be confirmed due to current cardiac rhythm.  CT Angio Chest Pulmonary Embolism (PE) W or WO Contrast  Result Date: 03/19/2022 CLINICAL DATA:  Abdominal pain. Postop. Ileostomy closure 6 days ago. Partial hepatectomy. Cholecystectomy. Colon cancer with liver metastasis. * Tracking Code: BO * EXAM: CT ANGIOGRAPHY CHEST CT ABDOMEN AND PELVIS WITH CONTRAST TECHNIQUE: Multidetector CT imaging of the chest was performed using the standard protocol during bolus administration of intravenous contrast. Multiplanar CT image reconstructions and MIPs were obtained to evaluate the vascular anatomy. Multidetector CT imaging of the abdomen and pelvis was performed using the standard protocol during bolus administration of intravenous contrast. RADIATION DOSE REDUCTION: This exam was performed according to the departmental dose-optimization program which includes automated exposure control, adjustment of the mA and/or kV according to patient size and/or use of  iterative reconstruction technique. CONTRAST:  151m OMNIPAQUE IOHEXOL 350 MG/ML SOLN COMPARISON:  Plain film chest earlier today. Plain film abdomen of yesterday. Abdominopelvic CT 06/09/2021. CTA chest 11/15/2020. FINDINGS: CTA CHEST FINDINGS Cardiovascular: The quality of this exam for evaluation of pulmonary embolism is good. No evidence of pulmonary embolism. Right Port-A-Cath tip superior caval/atrial junction. Aortic atherosclerosis. Normal heart size, without pericardial effusion. Lad  coronary artery calcification. Mediastinum/Nodes: No supraclavicular adenopathy. No mediastinal or hilar adenopathy. Lungs/Pleura: Small, right larger than left pleural effusions. Mild centrilobular emphysema. Right lower lobe collapse/consolidation. Dependent bilateral upper lobe and left lower lobe airspace disease. Musculoskeletal: No acute osseous abnormality. Review of the MIP images confirms the above findings. CT ABDOMEN and PELVIS FINDINGS Hepatobiliary: Capsular areas of right hepatic lobe fluid and gas including at 3.7 x 2.9 cm posteriorly on 32/3 and 3.2 x 4.0 cm more anteriorly on the same image. Presumably sites of metastatic ectomy with surgical packing material. Segment 3 presumed hepatic cyst of 1.7 cm. Smaller presumed metastasectomy site in the anterior left hepatic lobe on 25/3. Cholecystectomy.  No biliary duct dilatation. Pancreas: Normal, without mass or ductal dilatation. Spleen: Normal in size, without focal abnormality. Adrenals/Urinary Tract: Normal adrenal glands. Normal kidneys, without hydronephrosis. Air within the urinary bladder is most likely iatrogenic. Stomach/Bowel: Nasogastric tube terminating at the gastric cardia. Fluid throughout the colon, possibly representing a diarrheal state. Right hemicolectomy. Upper normal, fluid-filled small bowel loops without focal transition to suggest obstruction. Vascular/Lymphatic: Aortic atherosclerosis. No abdominopelvic adenopathy. Reproductive: Normal prostate. Other: Small volume perihepatic ascites. Peritoneal/mesenteric edema is most significant in the right upper quadrant. Trace cul-de-sac fluid. Trace extraluminal gas and fluid deep to midline laparotomy sutures without well-defined abscess. Perihepatic gas is presumably postoperative. Presumed ileostomy site is evidenced by gas in the right lower quadrant anterior abdominopelvic wall on 63/3. Musculoskeletal: No acute osseous abnormality. Review of the MIP images confirms the above  findings. IMPRESSION: CT CHEST IMPRESSION 1.  No evidence of pulmonary embolism. 2. Right larger than left pleural effusions with dependent pulmonary opacities, most consistent with atelectasis. 3. No findings of metastatic disease within the chest. 4. Age advanced coronary artery atherosclerosis. Recommend assessment of coronary risk factors. 5. Aortic atherosclerosis (ICD10-I70.0) and emphysema (ICD10-J43.9). CT ABDOMEN AND PELVIS IMPRESSION 1. Surgical changes, including right colectomy and hepatic metastatectomy. Small volume fluid and extraluminal gas, nonspecific. No well-defined fluid collection to suggest abscess. 2. Mildly prominent small bowel loops may represent adynamic ileus. No high-grade partial obstruction. Fluid-filled colon may represent a diarrheal state. Electronically Signed   By: KAbigail MiyamotoM.D.   On: 03/19/2022 16:23   CT ABDOMEN PELVIS W CONTRAST  Result Date: 03/19/2022 CLINICAL DATA:  Abdominal pain. Postop. Ileostomy closure 6 days ago. Partial hepatectomy. Cholecystectomy. Colon cancer with liver metastasis. * Tracking Code: BO * EXAM: CT ANGIOGRAPHY CHEST CT ABDOMEN AND PELVIS WITH CONTRAST TECHNIQUE: Multidetector CT imaging of the chest was performed using the standard protocol during bolus administration of intravenous contrast. Multiplanar CT image reconstructions and MIPs were obtained to evaluate the vascular anatomy. Multidetector CT imaging of the abdomen and pelvis was performed using the standard protocol during bolus administration of intravenous contrast. RADIATION DOSE REDUCTION: This exam was performed according to the departmental dose-optimization program which includes automated exposure control, adjustment of the mA and/or kV according to patient size and/or use of iterative reconstruction technique. CONTRAST:  1069mOMNIPAQUE IOHEXOL 350 MG/ML SOLN COMPARISON:  Plain film chest earlier today. Plain film abdomen of yesterday. Abdominopelvic CT 06/09/2021. CTA  chest  11/15/2020. FINDINGS: CTA CHEST FINDINGS Cardiovascular: The quality of this exam for evaluation of pulmonary embolism is good. No evidence of pulmonary embolism. Right Port-A-Cath tip superior caval/atrial junction. Aortic atherosclerosis. Normal heart size, without pericardial effusion. Lad coronary artery calcification. Mediastinum/Nodes: No supraclavicular adenopathy. No mediastinal or hilar adenopathy. Lungs/Pleura: Small, right larger than left pleural effusions. Mild centrilobular emphysema. Right lower lobe collapse/consolidation. Dependent bilateral upper lobe and left lower lobe airspace disease. Musculoskeletal: No acute osseous abnormality. Review of the MIP images confirms the above findings. CT ABDOMEN and PELVIS FINDINGS Hepatobiliary: Capsular areas of right hepatic lobe fluid and gas including at 3.7 x 2.9 cm posteriorly on 32/3 and 3.2 x 4.0 cm more anteriorly on the same image. Presumably sites of metastatic ectomy with surgical packing material. Segment 3 presumed hepatic cyst of 1.7 cm. Smaller presumed metastasectomy site in the anterior left hepatic lobe on 25/3. Cholecystectomy.  No biliary duct dilatation. Pancreas: Normal, without mass or ductal dilatation. Spleen: Normal in size, without focal abnormality. Adrenals/Urinary Tract: Normal adrenal glands. Normal kidneys, without hydronephrosis. Air within the urinary bladder is most likely iatrogenic. Stomach/Bowel: Nasogastric tube terminating at the gastric cardia. Fluid throughout the colon, possibly representing a diarrheal state. Right hemicolectomy. Upper normal, fluid-filled small bowel loops without focal transition to suggest obstruction. Vascular/Lymphatic: Aortic atherosclerosis. No abdominopelvic adenopathy. Reproductive: Normal prostate. Other: Small volume perihepatic ascites. Peritoneal/mesenteric edema is most significant in the right upper quadrant. Trace cul-de-sac fluid. Trace extraluminal gas and fluid deep to midline  laparotomy sutures without well-defined abscess. Perihepatic gas is presumably postoperative. Presumed ileostomy site is evidenced by gas in the right lower quadrant anterior abdominopelvic wall on 63/3. Musculoskeletal: No acute osseous abnormality. Review of the MIP images confirms the above findings. IMPRESSION: CT CHEST IMPRESSION 1.  No evidence of pulmonary embolism. 2. Right larger than left pleural effusions with dependent pulmonary opacities, most consistent with atelectasis. 3. No findings of metastatic disease within the chest. 4. Age advanced coronary artery atherosclerosis. Recommend assessment of coronary risk factors. 5. Aortic atherosclerosis (ICD10-I70.0) and emphysema (ICD10-J43.9). CT ABDOMEN AND PELVIS IMPRESSION 1. Surgical changes, including right colectomy and hepatic metastatectomy. Small volume fluid and extraluminal gas, nonspecific. No well-defined fluid collection to suggest abscess. 2. Mildly prominent small bowel loops may represent adynamic ileus. No high-grade partial obstruction. Fluid-filled colon may represent a diarrheal state. Electronically Signed   By: Abigail Miyamoto M.D.   On: 03/19/2022 16:23   DG CHEST PORT 1 VIEW  Result Date: 03/19/2022 CLINICAL DATA:  Shortness of breath, vomiting, abdominal pain EXAM: PORTABLE CHEST 1 VIEW COMPARISON:  09/22/2021 FINDINGS: Right chest port catheter. Heart and mediastinum are normal. Esophagogastric tube with tip below the diaphragm, side port just above the gastroesophageal junction. Small right pleural effusion and associated atelectasis or consolidation. The visualized skeletal structures are unremarkable. IMPRESSION: 1. Small right pleural effusion and associated atelectasis or consolidation. 2. Esophagogastric tube with tip below the diaphragm, side port just above the gastroesophageal junction. Consider advancement. Electronically Signed   By: Delanna Ahmadi M.D.   On: 03/19/2022 11:43   DG Abd Portable 1V  Result Date:  03/18/2022 CLINICAL DATA:  Nasogastric tube placement.  Recent bowel surgery. EXAM: PORTABLE ABDOMEN - 1 VIEW COMPARISON:  KUB 03/18/2022 at 1057 hours FINDINGS: KUB 03/18/2022 at 1144 hours. Enteric tube has been repositioned and now extends below the diaphragm with the tip overlying the gastric air bubble on the side port just distal to the gastroesophageal junction. Surgical clips again overlie  the right upper abdominal quadrant. There are midline surgical skin staples. Air is seen within mildly distended nondistended loops of small and large bowel, unchanged. IMPRESSION: 1. Nasogastric tube now in appropriate position. 2. Unchanged likely postoperative ileus. Electronically Signed   By: Yvonne Kendall M.D.   On: 03/18/2022 11:55   DG Abd Portable 1V  Result Date: 03/18/2022 CLINICAL DATA:  NG tube placement. EXAM: PORTABLE ABDOMEN - 1 VIEW COMPARISON:  Earlier film, same date. FINDINGS: The NG tube is kinked back on itself in the mid esophagus and needs to be repositioned. IMPRESSION: NG tube is kinked back on itself in the mid esophagus and needs to be repositioned. Electronically Signed   By: Marijo Sanes M.D.   On: 03/18/2022 11:18   DG Abd 2 Views  Result Date: 03/18/2022 CLINICAL DATA:  Abdominal pain and distension after bowel surgery EXAM: ABDOMEN - 2 VIEW COMPARISON:  06/09/2021 FINDINGS: Gaseous distension of numerous large and small bowel loops throughout the abdomen with small bowel dilatation up to 4.3 cm. No gross free intraperitoneal air. Midline surgical staples. Numerous surgical clips in the right hemiabdomen. IMPRESSION: Gaseous distension of numerous large and small bowel loops throughout the abdomen with small bowel dilatation up to 4.3 cm. Appearance favors postoperative ileus. Continued radiographic surveillance recommended to exclude obstruction. Electronically Signed   By: Davina Poke D.O.   On: 03/18/2022 08:32        Assessment/Plan 51 yo male with stage IV colon cancer  with liver metastases, POD7 s/p open hepatic wedge resection x2 and ileostomy reversal.  - Postoperative ileus: Patient having diarrhea but NG output remains high. Keep NG in today, consider clamping trials tomorrow if no nausea. - NPO except ice chips. - Place PICC today and begin TPN. - Multimodal pain control: scheduled IV robaxin, prn dilaudid. - Ambulate, pulmonary toilet, IS - COPD: Now O2. Stressed with patient the importance of pulmonary toilet and ambulation. Continue home Breo ellipta, prn albuterol. Aggressive pulmonary toilet. - Surg path showed no malignancy in hepatic wedge resections, indicating necrotic tumor. - VTE: lovenox, SCDs - Dispo: inpatient, med-surg floor    LOS: 7 days    Michaelle Birks, MD [facility] Surgery General, Hepatobiliary and Pancreatic Surgery 03/20/22 6:58 AM

## 2022-03-20 NOTE — Progress Notes (Signed)
Peripherally Inserted Central Catheter Placement  The IV Nurse has discussed with the patient and/or persons authorized to consent for the patient, the purpose of this procedure and the potential benefits and risks involved with this procedure.  The benefits include less needle sticks, lab draws from the catheter, and the patient may be discharged home with the catheter. Risks include, but not limited to, infection, bleeding, blood clot (thrombus formation), and puncture of an artery; nerve damage and irregular heartbeat and possibility to perform a PICC exchange if needed/ordered by physician.  Alternatives to this procedure were also discussed.  Bard Power PICC patient education guide, fact sheet on infection prevention and patient information card has been provided to patient /or left at bedside.    PICC Placement Documentation  PICC Double Lumen 03/20/22 Right Brachial 42 cm 0 cm (Active)  Indication for Insertion or Continuance of Line Administration of hyperosmolar/irritating solutions (i.e. TPN, Vancomycin, etc.) 03/20/22 1208  Exposed Catheter (cm) 0 cm 03/20/22 1208  Site Assessment Clean, Dry, Intact 03/20/22 1208  Lumen #1 Status Flushed;Blood return noted;Saline locked 03/20/22 1208  Lumen #2 Status Flushed;Blood return noted;Saline locked 03/20/22 1208  Dressing Type Transparent 03/20/22 1208  Dressing Status Antimicrobial disc in place 03/20/22 1208  Dressing Change Due 03/27/22 03/20/22 1208       Scotty Court 03/20/2022, 12:09 PM

## 2022-03-20 NOTE — Progress Notes (Signed)
Initial Nutrition Assessment  DOCUMENTATION CODES:   Severe malnutrition in context of chronic illness  INTERVENTION:  Initiation of TPN Monitor magnesium, potassium, and phosphorus BID for at least 3 days, MD to replete as needed, as pt is at risk for refeeding syndrome . Once diet able to advance, recommend SLP evaluation d/t pt report of intermittent difficulty swallowing  NUTRITION DIAGNOSIS:   Severe Malnutrition related to chronic illness (metastatic colon cancer) as evidenced by severe fat depletion, severe muscle depletion.  GOAL:   Patient will meet greater than or equal to 90% of their needs  MONITOR:   Diet advancement, Labs, Weight trends, I & O's  REASON FOR ASSESSMENT:   Consult New TPN/TNA  ASSESSMENT:   Pt with metastatic colon cancer, admitted for open ileostomy reversal and open hepatic wedge resection x2.  06/02: s/p ileostomy reversal, hepatic wedge resection 06/06: developed post-op ileus; NGT placed 06/0: PICC line placed, TPN initiated  Pt states that he continues with nausea. Noted PICC line placed today for initiation of TPN. Pt is familiar with TPN as he required this when he had ileostomy a year ago. He recalls that PTA he was eating 2-3 meals per day includes egg and sausage or strawberry Toaster strudel, frozen meals and occasional meals provided by family members. Pt has previously been drinking Ensure d/t surgeries and chemotherapy however was getting tired of them and switched to [place]. He is noted to have poor dentition and reports wearing dentures with no difficulty chewing, however has intermittent swallowing difficulty. Would recommend SLP evaluation once diet able to advance to ensure pt does not require modified textured diet.   Pt reports that he had lost significant wt last year, however had since begun to gain wt back and has remained steady around 163 lbs.  Per review of wt hx, wt appears stable.  Current admit wt: 74.9  kg  Medications: SSI 0-9 units Q6h, protonix  IV drips: LR @ 17m/hr, KCl  Labs: sodium 134, potassium 3.2, alkaline phosphatase 211, TGs 208  TG's slightly elevated, pharmacy monitoring daily.   NGT to LIS: 1.2L  UOP: +2773 ml since admission  NUTRITION - FOCUSED PHYSICAL EXAM:  Flowsheet Row Most Recent Value  Orbital Region Severe depletion  Upper Arm Region Severe depletion  Thoracic and Lumbar Region Moderate depletion  Buccal Region Severe depletion  Temple Region Severe depletion  Clavicle Bone Region Severe depletion  Clavicle and Acromion Bone Region Severe depletion  Scapular Bone Region Severe depletion  Dorsal Hand Severe depletion  Patellar Region Severe depletion  Anterior Thigh Region Severe depletion  Posterior Calf Region Moderate depletion  Edema (RD Assessment) None  Hair Reviewed  Eyes Reviewed  Mouth Other (Comment)  [poor dentition, wears dentures]  Skin Reviewed  Nails Other (Comment)  [yellow]       Diet Order:   Diet Order             Diet NPO time specified Except for: Sips with Meds, Ice Chips, Other (See Comments)  Diet effective now                   EDUCATION NEEDS:   Education needs have been addressed  Skin:  Skin Assessment: Skin Integrity Issues: Skin Integrity Issues:: Incisions Incisions: abdomen (closed)  Last BM:  6/8 (type 7 x4)  Height:   Ht Readings from Last 1 Encounters:  03/13/22 '6\' 3"'$  (1.905 m)    Weight:   Wt Readings from Last 1 Encounters:  03/19/22  74.9 kg   BMI:  Body mass index is 20.64 kg/m.  Estimated Nutritional Needs:   Kcal:  2200-2400  Protein:  110-125g  Fluid:  >/=2.2L  Clayborne Dana, RDN, LDN Clinical Nutrition

## 2022-03-20 NOTE — Plan of Care (Signed)
  Problem: Safety: Goal: Ability to remain free from injury will improve Outcome: Progressing   Problem: Nutrition: Goal: Adequate nutrition will be maintained Outcome: Not Progressing   Problem: Pain Managment: Goal: General experience of comfort will improve Outcome: Not Progressing   

## 2022-03-20 NOTE — Progress Notes (Signed)
PHARMACY - TOTAL PARENTERAL NUTRITION CONSULT NOTE   Indication: Prolonged ileus  Patient Measurements: Height: _0  (190.5 cm) Weight: 74.9 kg (165 lb 2 oz) IBW/kg (Calculated) : 84.5 TPN AdjBW (KG): 69.4 Body mass index is 20.64 kg/m. Usual Weight: 165 lb  Assessment: 89 yom with colon cancer with metastases to the liver. He underwent exlap LoA, cholecystectomy, wedge resection of liver lesions x2, and end ileostomy takedown on 6/2. Post-op he was put on clear liquids and was advanced to soft diet on 6/5 but had emesis that night. He is having loose bowel movements and passing flatus but post-op ileus suspected. NG tube was placed 6/7 and output continues to be high. Pharmacy consulted to start TPN.   Glucose / Insulin: no hx, A1c 5.8, BG <140 Electrolytes: Na 134, K 3.2 - 4 runs per MD, all others WNL Renal: SCr <1, BUN WNL Hepatic: LFTs/Tbili down WNL, Alk Phos down 211, TG 208, albumin 2.3 Intake / Output; MIVF: UOP 0.4 ml/kg/hr + 1 occurrence, 1200 mL NGT output, 4 loose stools on 6/8, LR 100 ml/hr  GI Imaging: 6/7 AXR: gaseous distension of numerous large and small bowel loops throughout the abdomen favoring post-op ileus 6/8 CT A/P: no abscess, mildly prominent small bowel loops may represent adynamic ileus, no high-grade partial obstruction GI Surgeries / Procedures:   Central access: PICC pending TPN start date: 03/20/22  Nutritional Goals: Goal TPN rate is 95 mL/hr (provides 93 g of protein, 55 g lipid (~25% kcal) and 2007 kcals per day)  RD Assessment:    Current Nutrition:  NPO and TPN  Plan:  Start TPN at 28m/hr at 1800 Provides 39 g of protein and 844 kcals per day meeting ~41% patient needs Electrolytes in TPN: Na 549m/L, K 5025mL, Ca 5mE65m, Mg 5mEq84m and Phos 15mmo55m Cl:Ac 1:1 Add standard MVI and trace elements to TPN Initiate Sensitive q6h SSI and adjust as needed  Reduce MIVF to 60 mL/hr at 1800 Monitor TPN labs on Mon/Thurs, and in am Monitor  TG trend  KCl 10 meq IV q1h x2 (6 runs total today)  Thank you for involving pharmacy in this patient's care.  JennifRenold GentamD, BCPS Clinical Pharmacist Clinical phone for 03/20/2022 until 3p is [phone] 7:11 AM  **Pharmacist phone directory can be found on amion.[facility] under [facility]

## 2022-03-21 LAB — CBC
HCT: 24.8 % — ABNORMAL LOW (ref 39.0–52.0)
Hemoglobin: 8.1 g/dL — ABNORMAL LOW (ref 13.0–17.0)
MCH: 29.7 pg (ref 26.0–34.0)
MCHC: 32.7 g/dL (ref 30.0–36.0)
MCV: 90.8 fL (ref 80.0–100.0)
Platelets: 316 10*3/uL (ref 150–400)
RBC: 2.73 MIL/uL — ABNORMAL LOW (ref 4.22–5.81)
RDW: 15.6 % — ABNORMAL HIGH (ref 11.5–15.5)
WBC: 14.4 10*3/uL — ABNORMAL HIGH (ref 4.0–10.5)
nRBC: 0 % (ref 0.0–0.2)

## 2022-03-21 LAB — COMPREHENSIVE METABOLIC PANEL
ALT: 31 U/L (ref 0–44)
AST: 19 U/L (ref 15–41)
Albumin: 2.2 g/dL — ABNORMAL LOW (ref 3.5–5.0)
Alkaline Phosphatase: 192 U/L — ABNORMAL HIGH (ref 38–126)
Anion gap: 9 (ref 5–15)
BUN: 12 mg/dL (ref 6–20)
CO2: 24 mmol/L (ref 22–32)
Calcium: 7.7 mg/dL — ABNORMAL LOW (ref 8.9–10.3)
Chloride: 102 mmol/L (ref 98–111)
Creatinine, Ser: 0.66 mg/dL (ref 0.61–1.24)
GFR, Estimated: >60 mL/min (ref >60)
Glucose, Bld: 128 mg/dL — ABNORMAL HIGH (ref 70–99)
Potassium: 3.1 mmol/L — ABNORMAL LOW (ref 3.5–5.1)
Sodium: 135 mmol/L (ref 135–145)
Total Bilirubin: 0.5 mg/dL (ref 0.3–1.2)
Total Protein: 5.7 g/dL — ABNORMAL LOW (ref 6.5–8.1)

## 2022-03-21 LAB — GLUCOSE, CAPILLARY
Glucose-Capillary: 107 mg/dL — ABNORMAL HIGH (ref 70–99)
Glucose-Capillary: 125 mg/dL — ABNORMAL HIGH (ref 70–99)
Glucose-Capillary: 130 mg/dL — ABNORMAL HIGH (ref 70–99)
Glucose-Capillary: 146 mg/dL — ABNORMAL HIGH (ref 70–99)

## 2022-03-21 LAB — MAGNESIUM: Magnesium: 2.1 mg/dL (ref 1.7–2.4)

## 2022-03-21 LAB — PHOSPHORUS: Phosphorus: 2.7 mg/dL (ref 2.5–4.6)

## 2022-03-21 LAB — TRIGLYCERIDES: Triglycerides: 191 mg/dL — ABNORMAL HIGH (ref <150)

## 2022-03-21 MED ORDER — GUAIFENESIN ER 600 MG PO TB12
600.0000 mg | ORAL_TABLET | Freq: Two times a day (BID) | ORAL | Status: DC
  Administered 2022-03-21 – 2022-04-05 (×30): 600 mg via ORAL
  Filled 2022-03-21 (×30): qty 1

## 2022-03-21 MED ORDER — HYDROMORPHONE HCL 1 MG/ML IJ SOLN
1.0000 mg | INTRAMUSCULAR | Status: AC | PRN
  Administered 2022-03-21 – 2022-03-22 (×5): 1 mg via INTRAVENOUS
  Filled 2022-03-21 (×5): qty 1

## 2022-03-21 MED ORDER — POTASSIUM CHLORIDE 10 MEQ/100ML IV SOLN
10.0000 meq | INTRAVENOUS | Status: AC
  Administered 2022-03-21 (×6): 10 meq via INTRAVENOUS
  Filled 2022-03-21 (×6): qty 100

## 2022-03-21 MED ORDER — TRAVASOL 10 % IV SOLN
INTRAVENOUS | Status: AC
  Filled 2022-03-21: qty 823.2

## 2022-03-21 MED ORDER — FLUTICASONE PROPIONATE 50 MCG/ACT NA SUSP
2.0000 | Freq: Every day | NASAL | Status: DC
  Administered 2022-03-21 – 2022-04-05 (×16): 2 via NASAL
  Filled 2022-03-21 (×2): qty 16

## 2022-03-21 NOTE — Progress Notes (Signed)
Patient ID: Eric [name], male   DOB: 14-Jul-1971, 51 y.o.   MRN: [id number] [facility] Surgery Progress Note:   8 Days Post-Op  Subjective: Mental status is clear.  Complaints minimal. Objective: Vital signs in last 24 hours: Temp:  [98.4 F (36.9 C)-99.4 F (37.4 C)] 98.8 F (37.1 C) (06/10 0740) Pulse Rate:  [86-100] 86 (06/10 0806) Resp:  [16-26] 26 (06/10 0806) BP: (141-156)/(74-102) 143/97 (06/10 0740) SpO2:  [87 %-95 %] 92 % (06/10 0806) Weight:  [74.2 kg] 74.2 kg (06/10 0500)  Intake/Output from previous day: 06/09 0701 - 06/10 0700 In: 1310.7 [P.O.:100; I.V.:1210.7] Out: 2150 [Urine:600; Emesis/NG output:1550] Intake/Output this shift: No intake/output data recorded.  Physical Exam: Work of breathing is normal.  Nontender abdomen.    Lab Results:  Results for orders placed or performed during the hospital encounter of 03/13/22 (from the past 48 hour(s))  Creatinine, serum     Status: None   Collection Time: 03/20/22  3:32 AM  Result Value Ref Range   Creatinine, Ser 0.71 0.61 - 1.24 mg/dL   GFR, Estimated >60 >60 mL/min    Comment: (NOTE) Calculated using the CKD-EPI Creatinine Equation (2021) Performed at [facility], [place] [address], [place], [place] [address]   Comprehensive metabolic panel     Status: Abnormal   Collection Time: 03/20/22  8:28 AM  Result Value Ref Range   Sodium 134 (L) 135 - 145 mmol/L   Potassium 3.2 (L) 3.5 - 5.1 mmol/L   Chloride 101 98 - 111 mmol/L   CO2 25 22 - 32 mmol/L   Glucose, Bld 98 70 - 99 mg/dL    Comment: Glucose reference range applies only to samples taken after fasting for at least 8 hours.   BUN 13 6 - 20 mg/dL   Creatinine, Ser 0.68 0.61 - 1.24 mg/dL   Calcium 7.8 (L) 8.9 - 10.3 mg/dL   Total Protein 5.8 (L) 6.5 - 8.1 g/dL   Albumin 2.3 (L) 3.5 - 5.0 g/dL   AST 21 15 - 41 U/L   ALT 39 0 - 44 U/L   Alkaline Phosphatase 211 (H) 38 - 126 U/L   Total Bilirubin 0.8 0.3 - 1.2 mg/dL   GFR, Estimated >60 >60  mL/min    Comment: (NOTE) Calculated using the CKD-EPI Creatinine Equation (2021)    Anion gap 8 5 - 15    Comment: Performed at [facility], [place] [address] [place], [place] [address]  CBC     Status: Abnormal   Collection Time: 03/20/22  8:28 AM  Result Value Ref Range   WBC 13.8 (H) 4.0 - 10.5 K/uL   RBC 2.77 (L) 4.22 - 5.81 MIL/uL   Hemoglobin 8.4 (L) 13.0 - 17.0 g/dL   HCT 25.5 (L) 39.0 - 52.0 %   MCV 92.1 80.0 - 100.0 fL   MCH 30.3 26.0 - 34.0 pg   MCHC 32.9 30.0 - 36.0 g/dL   RDW 15.7 (H) 11.5 - 15.5 %   Platelets 294 150 - 400 K/uL   nRBC 0.0 0.0 - 0.2 %    Comment: Performed at [facility], [place] [address], [place], [place] [address]  Triglycerides     Status: Abnormal   Collection Time: 03/20/22  8:28 AM  Result Value Ref Range   Triglycerides 208 (H) <150 mg/dL    Comment: Performed at [facility] [address], [place], [place] [address]  Magnesium  Status: None   Collection Time: 03/20/22  8:28 AM  Result Value Ref Range   Magnesium 2.2 1.7 - 2.4 mg/dL    Comment: Performed at [facility], [place] [address], [place], [place] [address]  Phosphorus     Status: None   Collection Time: 03/20/22  8:28 AM  Result Value Ref Range   Phosphorus 3.0 2.5 - 4.6 mg/dL    Comment: Performed at [facility] [address] [place], [place] [address]  Glucose, capillary     Status: None   Collection Time: 03/20/22  6:39 PM  Result Value Ref Range   Glucose-Capillary 88 70 - 99 mg/dL    Comment: Glucose reference range applies only to samples taken after fasting for at least 8 hours.  Glucose, capillary     Status: Abnormal   Collection Time: 03/20/22 11:50 PM  Result Value Ref Range   Glucose-Capillary 121 (H) 70 - 99 mg/dL    Comment: Glucose reference range applies only to samples taken after fasting for at least 8 hours.  CBC     Status: Abnormal   Collection Time: 03/21/22  3:40 AM  Result Value Ref Range   WBC 14.4 (H) 4.0 - 10.5  K/uL   RBC 2.73 (L) 4.22 - 5.81 MIL/uL   Hemoglobin 8.1 (L) 13.0 - 17.0 g/dL   HCT 24.8 (L) 39.0 - 52.0 %   MCV 90.8 80.0 - 100.0 fL   MCH 29.7 26.0 - 34.0 pg   MCHC 32.7 30.0 - 36.0 g/dL   RDW 15.6 (H) 11.5 - 15.5 %   Platelets 316 150 - 400 K/uL   nRBC 0.0 0.0 - 0.2 %    Comment: Performed at [facility] [address], [place], [place] [address]  Comprehensive metabolic panel     Status: Abnormal   Collection Time: 03/21/22  3:40 AM  Result Value Ref Range   Sodium 135 135 - 145 mmol/L   Potassium 3.1 (L) 3.5 - 5.1 mmol/L   Chloride 102 98 - 111 mmol/L   CO2 24 22 - 32 mmol/L   Glucose, Bld 128 (H) 70 - 99 mg/dL    Comment: Glucose reference range applies only to samples taken after fasting for at least 8 hours.   BUN 12 6 - 20 mg/dL   Creatinine, Ser 0.66 0.61 - 1.24 mg/dL   Calcium 7.7 (L) 8.9 - 10.3 mg/dL   Total Protein 5.7 (L) 6.5 - 8.1 g/dL   Albumin 2.2 (L) 3.5 - 5.0 g/dL   AST 19 15 - 41 U/L   ALT 31 0 - 44 U/L   Alkaline Phosphatase 192 (H) 38 - 126 U/L   Total Bilirubin 0.5 0.3 - 1.2 mg/dL   GFR, Estimated >60 >60 mL/min    Comment: (NOTE) Calculated using the CKD-EPI Creatinine Equation (2021)    Anion gap 9 5 - 15    Comment: Performed at [facility], [place] [address] [place], [place] [address]  Magnesium     Status: None   Collection Time: 03/21/22  3:40 AM  Result Value Ref Range   Magnesium 2.1 1.7 - 2.4 mg/dL    Comment: Performed at [facility] [address], [place],  [address]  Phosphorus     Status: None   Collection Time: 03/21/22  3:40 AM  Result Value Ref Range   Phosphorus 2.7 2.5 - 4.6 mg/dL    Comment: Performed at [facility], [place] [address], [place], [place] [address]  Triglycerides     Status: Abnormal   Collection Time: 03/21/22  3:40 AM  Result Value Ref Range   Triglycerides 191 (H) <150 mg/dL    Comment: Performed at [facility] [address], [place], [place] [address]  Glucose,  capillary     Status: Abnormal   Collection Time: 03/21/22  5:35 AM  Result Value Ref Range   Glucose-Capillary 130 (H) 70 - 99 mg/dL    Comment: Glucose reference range applies only to samples taken after fasting for at least 8 hours.    Radiology/Results: [place] EKG SITE RITE  Result Date: 03/20/2022 If Site Rite image not attached, placement could not be confirmed due to current cardiac rhythm.  CT Angio Chest Pulmonary Embolism (PE) W or WO Contrast  Result Date: 03/19/2022 CLINICAL DATA:  Abdominal pain. Postop. Ileostomy closure 6 days ago. Partial hepatectomy. Cholecystectomy. Colon cancer with liver metastasis. * Tracking Code: BO * EXAM: CT ANGIOGRAPHY CHEST CT ABDOMEN AND PELVIS WITH CONTRAST TECHNIQUE: Multidetector CT imaging of the chest was performed using the standard protocol during bolus administration of intravenous contrast. Multiplanar CT image reconstructions and MIPs were obtained to evaluate the vascular anatomy. Multidetector CT imaging of the abdomen and pelvis was performed using the standard protocol during bolus administration of intravenous contrast. RADIATION DOSE REDUCTION: This exam was performed according to the departmental dose-optimization program which includes automated exposure control, adjustment of the mA and/or kV according to patient size and/or use of iterative reconstruction technique. CONTRAST:  171m OMNIPAQUE IOHEXOL 350 MG/ML SOLN COMPARISON:  Plain film chest earlier today. Plain film abdomen of yesterday. Abdominopelvic CT 06/09/2021. CTA chest 11/15/2020. FINDINGS: CTA CHEST FINDINGS Cardiovascular: The quality of this exam for evaluation of pulmonary embolism is good. No evidence of pulmonary embolism. Right Port-A-Cath tip superior caval/atrial junction. Aortic atherosclerosis. Normal heart size, without pericardial effusion. Lad coronary artery calcification. Mediastinum/Nodes: No supraclavicular adenopathy. No mediastinal or hilar adenopathy.  Lungs/Pleura: Small, right larger than left pleural effusions. Mild centrilobular emphysema. Right lower lobe collapse/consolidation. Dependent bilateral upper lobe and left lower lobe airspace disease. Musculoskeletal: No acute osseous abnormality. Review of the MIP images confirms the above findings. CT ABDOMEN and PELVIS FINDINGS Hepatobiliary: Capsular areas of right hepatic lobe fluid and gas including at 3.7 x 2.9 cm posteriorly on 32/3 and 3.2 x 4.0 cm more anteriorly on the same image. Presumably sites of metastatic ectomy with surgical packing material. Segment 3 presumed hepatic cyst of 1.7 cm. Smaller presumed metastasectomy site in the anterior left hepatic lobe on 25/3. Cholecystectomy.  No biliary duct dilatation. Pancreas: Normal, without mass or ductal dilatation. Spleen: Normal in size, without focal abnormality. Adrenals/Urinary Tract: Normal adrenal glands. Normal kidneys, without hydronephrosis. Air within the urinary bladder is most likely iatrogenic. Stomach/Bowel: Nasogastric tube terminating at the gastric cardia. Fluid throughout the colon, possibly representing a diarrheal state. Right hemicolectomy. Upper normal, fluid-filled small bowel loops without focal transition to suggest obstruction. Vascular/Lymphatic: Aortic atherosclerosis. No abdominopelvic adenopathy. Reproductive: Normal prostate. Other: Small volume perihepatic ascites. Peritoneal/mesenteric edema is most significant in the right upper quadrant. Trace cul-de-sac fluid. Trace extraluminal gas and fluid deep to midline laparotomy sutures without well-defined abscess. Perihepatic gas is presumably postoperative. Presumed ileostomy site is evidenced by gas in the right lower quadrant anterior abdominopelvic wall on 63/3. Musculoskeletal: No acute osseous abnormality. Review of the MIP images confirms the above findings. IMPRESSION: CT CHEST IMPRESSION 1.  No  evidence of pulmonary embolism. 2. Right larger than left pleural  effusions with dependent pulmonary opacities, most consistent with atelectasis. 3. No findings of metastatic disease within the chest. 4. Age advanced coronary artery atherosclerosis. Recommend assessment of coronary risk factors. 5. Aortic atherosclerosis (ICD10-I70.0) and emphysema (ICD10-J43.9). CT ABDOMEN AND PELVIS IMPRESSION 1. Surgical changes, including right colectomy and hepatic metastatectomy. Small volume fluid and extraluminal gas, nonspecific. No well-defined fluid collection to suggest abscess. 2. Mildly prominent small bowel loops may represent adynamic ileus. No high-grade partial obstruction. Fluid-filled colon may represent a diarrheal state. Electronically Signed   By: Abigail Miyamoto M.D.   On: 03/19/2022 16:23   CT ABDOMEN PELVIS W CONTRAST  Result Date: 03/19/2022 CLINICAL DATA:  Abdominal pain. Postop. Ileostomy closure 6 days ago. Partial hepatectomy. Cholecystectomy. Colon cancer with liver metastasis. * Tracking Code: BO * EXAM: CT ANGIOGRAPHY CHEST CT ABDOMEN AND PELVIS WITH CONTRAST TECHNIQUE: Multidetector CT imaging of the chest was performed using the standard protocol during bolus administration of intravenous contrast. Multiplanar CT image reconstructions and MIPs were obtained to evaluate the vascular anatomy. Multidetector CT imaging of the abdomen and pelvis was performed using the standard protocol during bolus administration of intravenous contrast. RADIATION DOSE REDUCTION: This exam was performed according to the departmental dose-optimization program which includes automated exposure control, adjustment of the mA and/or kV according to patient size and/or use of iterative reconstruction technique. CONTRAST:  149m OMNIPAQUE IOHEXOL 350 MG/ML SOLN COMPARISON:  Plain film chest earlier today. Plain film abdomen of yesterday. Abdominopelvic CT 06/09/2021. CTA chest 11/15/2020. FINDINGS: CTA CHEST FINDINGS Cardiovascular: The quality of this exam for evaluation of pulmonary  embolism is good. No evidence of pulmonary embolism. Right Port-A-Cath tip superior caval/atrial junction. Aortic atherosclerosis. Normal heart size, without pericardial effusion. Lad coronary artery calcification. Mediastinum/Nodes: No supraclavicular adenopathy. No mediastinal or hilar adenopathy. Lungs/Pleura: Small, right larger than left pleural effusions. Mild centrilobular emphysema. Right lower lobe collapse/consolidation. Dependent bilateral upper lobe and left lower lobe airspace disease. Musculoskeletal: No acute osseous abnormality. Review of the MIP images confirms the above findings. CT ABDOMEN and PELVIS FINDINGS Hepatobiliary: Capsular areas of right hepatic lobe fluid and gas including at 3.7 x 2.9 cm posteriorly on 32/3 and 3.2 x 4.0 cm more anteriorly on the same image. Presumably sites of metastatic ectomy with surgical packing material. Segment 3 presumed hepatic cyst of 1.7 cm. Smaller presumed metastasectomy site in the anterior left hepatic lobe on 25/3. Cholecystectomy.  No biliary duct dilatation. Pancreas: Normal, without mass or ductal dilatation. Spleen: Normal in size, without focal abnormality. Adrenals/Urinary Tract: Normal adrenal glands. Normal kidneys, without hydronephrosis. Air within the urinary bladder is most likely iatrogenic. Stomach/Bowel: Nasogastric tube terminating at the gastric cardia. Fluid throughout the colon, possibly representing a diarrheal state. Right hemicolectomy. Upper normal, fluid-filled small bowel loops without focal transition to suggest obstruction. Vascular/Lymphatic: Aortic atherosclerosis. No abdominopelvic adenopathy. Reproductive: Normal prostate. Other: Small volume perihepatic ascites. Peritoneal/mesenteric edema is most significant in the right upper quadrant. Trace cul-de-sac fluid. Trace extraluminal gas and fluid deep to midline laparotomy sutures without well-defined abscess. Perihepatic gas is presumably postoperative. Presumed ileostomy  site is evidenced by gas in the right lower quadrant anterior abdominopelvic wall on 63/3. Musculoskeletal: No acute osseous abnormality. Review of the MIP images confirms the above findings. IMPRESSION: CT CHEST IMPRESSION 1.  No evidence of pulmonary embolism. 2. Right larger than left pleural effusions with dependent pulmonary opacities, most consistent with atelectasis. 3. No findings of metastatic disease within  the chest. 4. Age advanced coronary artery atherosclerosis. Recommend assessment of coronary risk factors. 5. Aortic atherosclerosis (ICD10-I70.0) and emphysema (ICD10-J43.9). CT ABDOMEN AND PELVIS IMPRESSION 1. Surgical changes, including right colectomy and hepatic metastatectomy. Small volume fluid and extraluminal gas, nonspecific. No well-defined fluid collection to suggest abscess. 2. Mildly prominent small bowel loops may represent adynamic ileus. No high-grade partial obstruction. Fluid-filled colon may represent a diarrheal state. Electronically Signed   By: Abigail Miyamoto M.D.   On: 03/19/2022 16:23   DG CHEST PORT 1 VIEW  Result Date: 03/19/2022 CLINICAL DATA:  Shortness of breath, vomiting, abdominal pain EXAM: PORTABLE CHEST 1 VIEW COMPARISON:  09/22/2021 FINDINGS: Right chest port catheter. Heart and mediastinum are normal. Esophagogastric tube with tip below the diaphragm, side port just above the gastroesophageal junction. Small right pleural effusion and associated atelectasis or consolidation. The visualized skeletal structures are unremarkable. IMPRESSION: 1. Small right pleural effusion and associated atelectasis or consolidation. 2. Esophagogastric tube with tip below the diaphragm, side port just above the gastroesophageal junction. Consider advancement. Electronically Signed   By: Delanna Ahmadi M.D.   On: 03/19/2022 11:43    Anti-infectives: Anti-infectives (From admission, onward)    Start     Dose/Rate Route Frequency Ordered Stop   03/13/22 0645  cefoTEtan (CEFOTAN) 2 g  in sodium chloride 0.9 % 100 mL IVPB        2 g 200 mL/hr over 30 Minutes Intravenous On call to O.R. 03/13/22 0642 03/13/22 0926       Assessment/Plan: Problem List: Patient Active Problem List   Diagnosis Date Noted   Ileus, postoperative ([place]) 03/18/2022   Colon cancer metastasized to liver ([place]) 03/13/2022   Centrilobular emphysema ([place]) 02/04/2022   SOB (shortness of breath) 09/23/2021   DOE (dyspnea on exertion) - rule out PE 09/22/2021   Dyspnea on exertion 09/22/2021   Goals of care, counseling/discussion 12/18/2020   Perforated colon cancer s/p right colectomy/ileostomy 11/28/2020 12/01/2020   Malnutrition of moderate degree 12/01/2020   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 ([place]) 11/21/2020   Protein-calorie malnutrition, severe ([place]) 11/18/2020   Liver mass, right lobe 11/18/2020   Right groin pain 11/18/2020   Ambulates with cane 11/18/2020   Tobacco abuse 11/18/2020   Fatigue 11/18/2020   Symptomatic anemia 11/18/2020   Colonic mass 11/15/2020   Iron deficiency anemia due to chronic blood loss 11/15/2020    He seemed somewhat indifferent to my inquiries and exam.  Stable.  8 Days Post-Op    LOS: 8 days   Matt B. Hassell Done, MD, [facility] [phone] to reach the surgeon on call.    03/21/2022 9:04 AM

## 2022-03-21 NOTE — Plan of Care (Signed)
  Problem: Nutrition: Goal: Adequate nutrition will be maintained Outcome: Not Progressing   Problem: Activity: Goal: Risk for activity intolerance will decrease Outcome: Not Progressing   Problem: Coping: Goal: Level of anxiety will decrease Outcome: Not Progressing   Problem: Elimination: Goal: Will not experience complications related to bowel motility Outcome: Not Progressing   Problem: Pain Managment: Goal: General experience of comfort will improve Outcome: Not Progressing   Problem: Skin Integrity: Goal: Risk for impaired skin integrity will decrease Outcome: Not Progressing

## 2022-03-21 NOTE — Progress Notes (Signed)
PHARMACY - TOTAL PARENTERAL NUTRITION CONSULT NOTE   Indication: Prolonged ileus  Patient Measurements: Height: 6\' 3"  (190.5 cm) Weight: 74.2 kg (163 lb 9.3 oz) IBW/kg (Calculated) : 84.5 TPN AdjBW (KG): 69.4 Body mass index is 20.45 kg/m. Usual Weight: 165 lb  Assessment: 70 yom with colon cancer with metastases to the liver. He underwent exlap LoA, cholecystectomy, wedge resection of liver lesions x2, and end ileostomy takedown on 6/2. Post-op he was put on clear liquids and was advanced to soft diet on 6/5 but had emesis that night. He is having loose bowel movements and passing flatus but post-op ileus suspected. NG tube was placed 6/7 and output continues to be high. Pharmacy consulted to start TPN.   Glucose / Insulin: no hx, A1c 5.8, BG <140, 2 units SSI used since TPN started Electrolytes: Na 135, K 3.1 - 6 runs per MD, all others WNL Renal: SCr <1, BUN WNL Hepatic: LFTs/Tbili down WNL, Alk Phos down 192, TG 208>>191, albumin 2.2 Intake / Output; MIVF: UOP 0.3 ml/kg/hr + 1 occurrence, 1550 mL NGT output, 2 BMs on 6/9, LR 60 ml/hr  GI Imaging: 6/7 AXR: gaseous distension of numerous large and small bowel loops throughout the abdomen favoring post-op ileus 6/8 CT A/P: no abscess, mildly prominent small bowel loops may represent adynamic ileus, no high-grade partial obstruction GI Surgeries / Procedures:   Central access: double lumen PICC on 6/9 TPN start date: 03/20/22  Nutritional Goals: Goal TPN rate is 95 mL/hr (provides 111 g of protein, 61 g lipid (~25% kcal) and 2225 kcals per day)  RD Assessment: Estimated Needs Total Energy Estimated Needs: 2200-2400 Total Protein Estimated Needs: 110-125g Total Fluid Estimated Needs: >/=2.2L  Current Nutrition:  NPO and TPN  Plan:  Increase TPN to 43mL/hr at 1800 Provides 82 g of protein and 1640 kcals per day meeting ~74% patient needs Electrolytes in TPN: Na 48mEq/L, K 85mEq/L, Ca 26mEq/L, Mg 107mEq/L, and Phos 6mmol/L.  Cl:Ac 1:1 - lytes will increase with TPN rate Add standard MVI and trace elements to TPN Initiate Sensitive q6h SSI and adjust as needed  Reduce MIVF to 30 mL/hr at 1800 Monitor TPN labs on Mon/Thurs, and in am Monitor TG trend  KCl 10 meq IV q1h x6 per MD  Thank you for involving pharmacy in this patient's care.  Renold Genta, PharmD, BCPS Clinical Pharmacist Clinical phone for 03/21/2022 until 3p is [phone] 03/21/2022 7:25 AM  **Pharmacist phone directory can be found on [email or web address] listed under [place]**

## 2022-03-22 LAB — BASIC METABOLIC PANEL
Anion gap: 8 (ref 5–15)
BUN: 13 mg/dL (ref 6–20)
CO2: 24 mmol/L (ref 22–32)
Calcium: 7.7 mg/dL — ABNORMAL LOW (ref 8.9–10.3)
Chloride: 101 mmol/L (ref 98–111)
Creatinine, Ser: 0.66 mg/dL (ref 0.61–1.24)
GFR, Estimated: >60 mL/min (ref >60)
Glucose, Bld: 128 mg/dL — ABNORMAL HIGH (ref 70–99)
Potassium: 3.4 mmol/L — ABNORMAL LOW (ref 3.5–5.1)
Sodium: 133 mmol/L — ABNORMAL LOW (ref 135–145)

## 2022-03-22 LAB — GLUCOSE, CAPILLARY
Glucose-Capillary: 151 mg/dL — ABNORMAL HIGH (ref 70–99)
Glucose-Capillary: 133 mg/dL — ABNORMAL HIGH (ref 70–99)
Glucose-Capillary: 133 mg/dL — ABNORMAL HIGH (ref 70–99)
Glucose-Capillary: 135 mg/dL — ABNORMAL HIGH (ref 70–99)

## 2022-03-22 LAB — CBC
HCT: 23.9 % — ABNORMAL LOW (ref 39.0–52.0)
Hemoglobin: 7.9 g/dL — ABNORMAL LOW (ref 13.0–17.0)
MCH: 30.2 pg (ref 26.0–34.0)
MCHC: 33.1 g/dL (ref 30.0–36.0)
MCV: 91.2 fL (ref 80.0–100.0)
Platelets: 313 10*3/uL (ref 150–400)
RBC: 2.62 MIL/uL — ABNORMAL LOW (ref 4.22–5.81)
RDW: 15.7 % — ABNORMAL HIGH (ref 11.5–15.5)
WBC: 13.6 10*3/uL — ABNORMAL HIGH (ref 4.0–10.5)
nRBC: 0 % (ref 0.0–0.2)

## 2022-03-22 LAB — PHOSPHORUS: Phosphorus: 3 mg/dL (ref 2.5–4.6)

## 2022-03-22 LAB — MAGNESIUM: Magnesium: 2.2 mg/dL (ref 1.7–2.4)

## 2022-03-22 MED ORDER — HYDROMORPHONE HCL 1 MG/ML IJ SOLN
1.0000 mg | INTRAMUSCULAR | Status: AC | PRN
  Administered 2022-03-22 (×5): 1 mg via INTRAVENOUS
  Filled 2022-03-22 (×5): qty 1

## 2022-03-22 MED ORDER — TRAVASOL 10 % IV SOLN
INTRAVENOUS | Status: AC
  Filled 2022-03-22: qty 1117.2

## 2022-03-22 MED ORDER — POTASSIUM CHLORIDE 10 MEQ/50ML IV SOLN
10.0000 meq | INTRAVENOUS | Status: AC
  Administered 2022-03-22 (×6): 10 meq via INTRAVENOUS
  Filled 2022-03-22 (×7): qty 50

## 2022-03-22 MED ORDER — HYDROMORPHONE HCL 1 MG/ML IJ SOLN
1.0000 mg | INTRAMUSCULAR | Status: AC | PRN
  Administered 2022-03-23 (×5): 1 mg via INTRAVENOUS
  Filled 2022-03-22 (×5): qty 1

## 2022-03-22 MED ORDER — POTASSIUM CHLORIDE 10 MEQ/50ML IV SOLN
10.0000 meq | INTRAVENOUS | Status: DC
  Filled 2022-03-22 (×6): qty 50

## 2022-03-22 NOTE — Progress Notes (Signed)
Patient ID: Eric [name], male   DOB: 05/19/1971, 51 y.o.   MRN: [id number] [facility] Progress Note:   9 Days Post-Op  Subjective: Mental status is clearer today.  Having flatus and bm.  Complaints NG. Objective: Vital signs in last 24 hours: Temp:  [98.5 F (36.9 C)-99.6 F (37.6 C)] 98.5 F (36.9 C) (06/11 0751) Pulse Rate:  [98-103] 98 (06/11 0751) Resp:  [16-19] 16 (06/11 0751) BP: (140-156)/(88-104) 144/94 (06/11 0751) SpO2:  [93 %-95 %] 94 % (06/11 0859) Weight:  [74.6 kg] 74.6 kg (06/11 0500)  Intake/Output from previous day: 06/10 0701 - 06/11 0700 In: 3867 [P.O.:100; I.V.:2167; IV Piggyback:1600] Out: 1050 [Emesis/NG output:1050] Intake/Output this shift: No intake/output data recorded.  Physical Exam: Work of breathing is normal.  NG removed.  Will offer clear liquids PO  Lab Results:  Results for orders placed or performed during the hospital encounter of 03/13/22 (from the past 48 hour(s))  Glucose, capillary     Status: None   Collection Time: 03/20/22  6:39 PM  Result Value Ref Range   Glucose-Capillary 88 70 - 99 mg/dL    Comment: Glucose reference range applies only to samples taken after fasting for at least 8 hours.  Glucose, capillary     Status: Abnormal   Collection Time: 03/20/22 11:50 PM  Result Value Ref Range   Glucose-Capillary 121 (H) 70 - 99 mg/dL    Comment: Glucose reference range applies only to samples taken after fasting for at least 8 hours.  CBC     Status: Abnormal   Collection Time: 03/21/22  3:40 AM  Result Value Ref Range   WBC 14.4 (H) 4.0 - 10.5 K/uL   RBC 2.73 (L) 4.22 - 5.81 MIL/uL   Hemoglobin 8.1 (L) 13.0 - 17.0 g/dL   HCT 24.8 (L) 39.0 - 52.0 %   MCV 90.8 80.0 - 100.0 fL   MCH 29.7 26.0 - 34.0 pg   MCHC 32.7 30.0 - 36.0 g/dL   RDW 15.6 (H) 11.5 - 15.5 %   Platelets 316 150 - 400 K/uL   nRBC 0.0 0.0 - 0.2 %    Comment: Performed at [facility] [address] [place], [place] [address]  Comprehensive  metabolic panel     Status: Abnormal   Collection Time: 03/21/22  3:40 AM  Result Value Ref Range   Sodium 135 135 - 145 mmol/L   Potassium 3.1 (L) 3.5 - 5.1 mmol/L   Chloride 102 98 - 111 mmol/L   CO2 24 22 - 32 mmol/L   Glucose, Bld 128 (H) 70 - 99 mg/dL    Comment: Glucose reference range applies only to samples taken after fasting for at least 8 hours.   BUN 12 6 - 20 mg/dL   Creatinine, Ser 0.66 0.61 - 1.24 mg/dL   Calcium 7.7 (L) 8.9 - 10.3 mg/dL   Total Protein 5.7 (L) 6.5 - 8.1 g/dL   Albumin 2.2 (L) 3.5 - 5.0 g/dL   AST 19 15 - 41 U/L   ALT 31 0 - 44 U/L   Alkaline Phosphatase 192 (H) 38 - 126 U/L   Total Bilirubin 0.5 0.3 - 1.2 mg/dL   GFR, Estimated >60 >60 mL/min    Comment: (NOTE) Calculated using the CKD-EPI Creatinine Equation (2021)    Anion gap 9 5 - 15    Comment: Performed at [facility], [place] [address], [place], [place] [address]  Magnesium  Status: None   Collection Time: 03/21/22  3:40 AM  Result Value Ref Range   Magnesium 2.1 1.7 - 2.4 mg/dL    Comment: Performed at [facility], [place] [address], [place], [place] [address]  Phosphorus     Status: None   Collection Time: 03/21/22  3:40 AM  Result Value Ref Range   Phosphorus 2.7 2.5 - 4.6 mg/dL    Comment: Performed at [facility] [address], [place], [place] [address]  Triglycerides     Status: Abnormal   Collection Time: 03/21/22  3:40 AM  Result Value Ref Range   Triglycerides 191 (H) <150 mg/dL    Comment: Performed at [facility] [address], [place], [place] [address]  Glucose, capillary     Status: Abnormal   Collection Time: 03/21/22  5:35 AM  Result Value Ref Range   Glucose-Capillary 130 (H) 70 - 99 mg/dL    Comment: Glucose reference range applies only to samples taken after fasting for at least 8 hours.  Glucose, capillary     Status: Abnormal   Collection Time: 03/21/22 12:25 PM  Result Value Ref Range   Glucose-Capillary 125 (H) 70 - 99 mg/dL     Comment: Glucose reference range applies only to samples taken after fasting for at least 8 hours.  Glucose, capillary     Status: Abnormal   Collection Time: 03/21/22  5:10 PM  Result Value Ref Range   Glucose-Capillary 107 (H) 70 - 99 mg/dL    Comment: Glucose reference range applies only to samples taken after fasting for at least 8 hours.  Glucose, capillary     Status: Abnormal   Collection Time: 03/21/22 11:37 PM  Result Value Ref Range   Glucose-Capillary 146 (H) 70 - 99 mg/dL    Comment: Glucose reference range applies only to samples taken after fasting for at least 8 hours.  CBC     Status: Abnormal   Collection Time: 03/22/22  1:23 AM  Result Value Ref Range   WBC 13.6 (H) 4.0 - 10.5 K/uL   RBC 2.62 (L) 4.22 - 5.81 MIL/uL   Hemoglobin 7.9 (L) 13.0 - 17.0 g/dL   HCT 23.9 (L) 39.0 - 52.0 %   MCV 91.2 80.0 - 100.0 fL   MCH 30.2 26.0 - 34.0 pg   MCHC 33.1 30.0 - 36.0 g/dL   RDW 15.7 (H) 11.5 - 15.5 %   Platelets 313 150 - 400 K/uL   nRBC 0.0 0.0 - 0.2 %    Comment: Performed at [facility] [address] [place],  [address]  Basic metabolic panel     Status: Abnormal   Collection Time: 03/22/22  1:23 AM  Result Value Ref Range   Sodium 133 (L) 135 - 145 mmol/L   Potassium 3.4 (L) 3.5 - 5.1 mmol/L   Chloride 101 98 - 111 mmol/L   CO2 24 22 - 32 mmol/L   Glucose, Bld 128 (H) 70 - 99 mg/dL    Comment: Glucose reference range applies only to samples taken after fasting for at least 8 hours.   BUN 13 6 - 20 mg/dL   Creatinine, Ser 0.66 0.61 - 1.24 mg/dL   Calcium 7.7 (L) 8.9 - 10.3 mg/dL   GFR, Estimated >60 >60 mL/min    Comment: (NOTE) Calculated using the CKD-EPI Creatinine Equation (2021)    Anion gap 8 5 - 15    Comment: Performed at [facility], [place] [address], [place], [place] [address]  Magnesium     Status: None   Collection Time: 03/22/22  1:23 AM  Result Value Ref Range   Magnesium 2.2 1.7 - 2.4 mg/dL    Comment: Performed at [facility] [address] [place], [place] [address]  Phosphorus     Status: None   Collection Time: 03/22/22  1:23 AM  Result Value Ref Range   Phosphorus 3.0 2.5 - 4.6 mg/dL    Comment: Performed at [facility] [address], [place], [place] [address]  Glucose, capillary     Status: Abnormal   Collection Time: 03/22/22  6:20 AM  Result Value Ref Range   Glucose-Capillary 151 (H) 70 - 99 mg/dL    Comment: Glucose reference range applies only to samples taken after fasting for at least 8 hours.  Glucose, capillary     Status: Abnormal   Collection Time: 03/22/22  7:25 AM  Result Value Ref Range   Glucose-Capillary 133 (H) 70 - 99 mg/dL    Comment: Glucose reference range applies only to samples taken after fasting for at least 8 hours.    Radiology/Results: No results found.  Anti-infectives: Anti-infectives (From admission, onward)    Start     Dose/Rate Route Frequency Ordered Stop   03/13/22 0645  cefoTEtan (CEFOTAN) 2 g in sodium chloride 0.9 % 100 mL IVPB        2 g 200 mL/hr over 30 Minutes Intravenous On call to O.R. 03/13/22 0642 03/13/22 0926       Assessment/Plan: Problem List: Patient Active Problem List   Diagnosis Date Noted   Ileus, postoperative ([place]) 03/18/2022   Colon cancer metastasized to liver ([place]) 03/13/2022   Centrilobular emphysema ([place]) 02/04/2022   SOB (shortness of breath) 09/23/2021   DOE (dyspnea on exertion) - rule out PE 09/22/2021   Dyspnea on exertion 09/22/2021   Goals of care, counseling/discussion 12/18/2020   Perforated colon cancer s/p right colectomy/ileostomy 11/28/2020 12/01/2020   Malnutrition of moderate degree 12/01/2020   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 ([place]) 11/21/2020   Protein-calorie malnutrition, severe ([place]) 11/18/2020   Liver mass, right lobe 11/18/2020   Right groin pain 11/18/2020   Ambulates with cane 11/18/2020   Tobacco abuse 11/18/2020   Fatigue 11/18/2020   Symptomatic anemia  11/18/2020   Colonic mass 11/15/2020   Iron deficiency anemia due to chronic blood loss 11/15/2020    NG out for resolved ileus.  Begin clear liquids 9 Days Post-Op    LOS: 9 days   Matt B. Hassell Done, MD, [facility] [phone] to reach the surgeon on call.    03/22/2022 9:34 AM

## 2022-03-22 NOTE — Plan of Care (Signed)
  Problem: Skin Integrity: Goal: Risk for impaired skin integrity will decrease Outcome: Not Progressing   Problem: Nutritional: Goal: Maintenance of adequate nutrition will improve Outcome: Not Progressing   Problem: Nutritional: Goal: Progress toward achieving an optimal weight will improve Outcome: Not Progressing   Problem: Pain Managment: Goal: General experience of comfort will improve Outcome: Not Progressing

## 2022-03-22 NOTE — Progress Notes (Signed)
PHARMACY - TOTAL PARENTERAL NUTRITION CONSULT NOTE   Indication: Prolonged ileus  Patient Measurements: Height: 6' 3" (190.5 cm) Weight: 74.6 kg (164 lb 7.4 oz) IBW/kg (Calculated) : 84.5 TPN AdjBW (KG): 69.4 Body mass index is 20.56 kg/m. Usual Weight: 165 lb  Assessment: 50 yom with colon cancer with metastases to the liver. He underwent exlap LoA, cholecystectomy, wedge resection of liver lesions x2, and end ileostomy takedown on 6/2. Post-op he was put on clear liquids and was advanced to soft diet on 6/5 but had emesis that night. He is having loose bowel movements and passing flatus but post-op ileus suspected. NG tube was placed 6/7 and output continues to be high. Pharmacy consulted to start TPN.   Glucose / Insulin: no hx, A1c 5.8, BG <180, 3 units SSI used in last 24h Electrolytes: Na 133, K 3.4 - replaced, all others WNL Renal: SCr <1, BUN WNL Hepatic: LFTs/Tbili down WNL, Alk Phos down 192, TG 208>>191, albumin 2.2 Intake / Output; MIVF: UOP not documented, 1050 mL NGT output, 2 BMs on 6/9, LR 30 ml/hr  GI Imaging: 6/7 AXR: gaseous distension of numerous large and small bowel loops throughout the abdomen favoring post-op ileus 6/8 CT A/P: no abscess, mildly prominent small bowel loops may represent adynamic ileus, no high-grade partial obstruction GI Surgeries / Procedures:   Central access: double lumen PICC on 6/9 TPN start date: 03/20/22  Nutritional Goals: Goal TPN rate is 95 mL/hr (provides 111 g of protein, 61 g lipid (~25% kcal) and 2225 kcals per day)  RD Assessment: Estimated Needs Total Energy Estimated Needs: 2200-2400 Total Protein Estimated Needs: 110-125g Total Fluid Estimated Needs: >/=2.2L  Current Nutrition:  NPO and TPN  Plan:  Increase TPN to 95mL/hr at 1800 Provides 111 g of protein and 2225 kcals per day meeting 100% needs Electrolytes in TPN: Na 50mEq/L, increase K 59mEq/L, Ca 5mEq/L, Mg 5mEq/L, and Phos 15mmol/L. Cl:Ac 1:1 - lytes will  increase with TPN rate Add standard MVI and trace elements to TPN Initiate Sensitive q6h SSI and adjust as needed  Discontinue MIVF at 1800 (discussed with Surgery) Monitor TPN labs on Mon/Thurs Monitor TG trend  KCl 10 meq IV q1h x6  Thank you for involving pharmacy in this patient's care.   [place], PharmD, BCPS Clinical Pharmacist Clinical phone for 03/22/2022 until 3p is [phone] 03/22/2022 7:50 AM  **Pharmacist phone directory can be found on [email or web address] listed under [facility]**   

## 2022-03-23 ENCOUNTER — Inpatient Hospital Stay (HOSPITAL_COMMUNITY): Payer: 59 | Admitting: Certified Registered Nurse Anesthetist

## 2022-03-23 ENCOUNTER — Encounter (HOSPITAL_COMMUNITY)
Admission: RE | Disposition: A | Discharge status: Home Health | Payer: Self-pay | Source: Home / Self Care | Attending: Surgery

## 2022-03-23 ENCOUNTER — Encounter (HOSPITAL_COMMUNITY): Payer: Self-pay | Admitting: Surgery

## 2022-03-23 ENCOUNTER — Other Ambulatory Visit: Payer: Self-pay

## 2022-03-23 DIAGNOSIS — D649 Anemia, unspecified: Secondary | ICD-10-CM

## 2022-03-23 DIAGNOSIS — J449 Chronic obstructive pulmonary disease, unspecified: Secondary | ICD-10-CM | POA: Diagnosis not present

## 2022-03-23 DIAGNOSIS — T8131XA Disruption of external operation (surgical) wound, not elsewhere classified, initial encounter: Secondary | ICD-10-CM | POA: Diagnosis not present

## 2022-03-23 HISTORY — PX: LAPAROTOMY: SHX154

## 2022-03-23 HISTORY — PX: INSERTION OF MESH: SHX5868

## 2022-03-23 LAB — GLUCOSE, CAPILLARY
Glucose-Capillary: 127 mg/dL — ABNORMAL HIGH (ref 70–99)
Glucose-Capillary: 130 mg/dL — ABNORMAL HIGH (ref 70–99)
Glucose-Capillary: 136 mg/dL — ABNORMAL HIGH (ref 70–99)
Glucose-Capillary: 144 mg/dL — ABNORMAL HIGH (ref 70–99)
Glucose-Capillary: 158 mg/dL — ABNORMAL HIGH (ref 70–99)
Glucose-Capillary: 188 mg/dL — ABNORMAL HIGH (ref 70–99)

## 2022-03-23 LAB — COMPREHENSIVE METABOLIC PANEL
ALT: 32 U/L (ref 0–44)
AST: 28 U/L (ref 15–41)
Albumin: 2.1 g/dL — ABNORMAL LOW (ref 3.5–5.0)
Alkaline Phosphatase: 251 U/L — ABNORMAL HIGH (ref 38–126)
Anion gap: 10 (ref 5–15)
BUN: 11 mg/dL (ref 6–20)
CO2: 21 mmol/L — ABNORMAL LOW (ref 22–32)
Calcium: 8 mg/dL — ABNORMAL LOW (ref 8.9–10.3)
Chloride: 100 mmol/L (ref 98–111)
Creatinine, Ser: 0.62 mg/dL (ref 0.61–1.24)
GFR, Estimated: >60 mL/min (ref >60)
Glucose, Bld: 131 mg/dL — ABNORMAL HIGH (ref 70–99)
Potassium: 4.3 mmol/L (ref 3.5–5.1)
Sodium: 131 mmol/L — ABNORMAL LOW (ref 135–145)
Total Bilirubin: 0.9 mg/dL (ref 0.3–1.2)
Total Protein: 6.2 g/dL — ABNORMAL LOW (ref 6.5–8.1)

## 2022-03-23 LAB — CBC
HCT: 24.2 % — ABNORMAL LOW (ref 39.0–52.0)
Hemoglobin: 7.8 g/dL — ABNORMAL LOW (ref 13.0–17.0)
MCH: 29.4 pg (ref 26.0–34.0)
MCHC: 32.2 g/dL (ref 30.0–36.0)
MCV: 91.3 fL (ref 80.0–100.0)
Platelets: 351 10*3/uL (ref 150–400)
RBC: 2.65 MIL/uL — ABNORMAL LOW (ref 4.22–5.81)
RDW: 15.9 % — ABNORMAL HIGH (ref 11.5–15.5)
WBC: 14.6 10*3/uL — ABNORMAL HIGH (ref 4.0–10.5)
nRBC: 0 % (ref 0.0–0.2)

## 2022-03-23 LAB — PHOSPHORUS: Phosphorus: 3 mg/dL (ref 2.5–4.6)

## 2022-03-23 LAB — TRIGLYCERIDES: Triglycerides: 99 mg/dL (ref <150)

## 2022-03-23 LAB — TYPE AND SCREEN
ABO/RH(D): A POS
Antibody Screen: NEGATIVE

## 2022-03-23 LAB — MAGNESIUM: Magnesium: 2.1 mg/dL (ref 1.7–2.4)

## 2022-03-23 SURGERY — LAPAROTOMY, EXPLORATORY
Anesthesia: General | Site: Abdomen

## 2022-03-23 MED ORDER — SUGAMMADEX SODIUM 200 MG/2ML IV SOLN
INTRAVENOUS | Status: DC | PRN
  Administered 2022-03-23: 200 mg via INTRAVENOUS

## 2022-03-23 MED ORDER — MIDAZOLAM HCL 2 MG/2ML IJ SOLN
INTRAMUSCULAR | Status: AC
  Filled 2022-03-23: qty 2

## 2022-03-23 MED ORDER — HEPARIN SOD (PORK) LOCK FLUSH 100 UNIT/ML IV SOLN
500.0000 [IU] | INTRAVENOUS | Status: AC | PRN
  Administered 2022-03-23: 500 [IU]

## 2022-03-23 MED ORDER — TRAVASOL 10 % IV SOLN
INTRAVENOUS | Status: AC
  Filled 2022-03-23: qty 1117.2

## 2022-03-23 MED ORDER — CHLORHEXIDINE GLUCONATE 0.12 % MT SOLN
OROMUCOSAL | Status: AC
  Administered 2022-03-23: 15 mL via OROMUCOSAL
  Filled 2022-03-23: qty 15

## 2022-03-23 MED ORDER — DEXAMETHASONE SODIUM PHOSPHATE 10 MG/ML IJ SOLN
INTRAMUSCULAR | Status: DC | PRN
  Administered 2022-03-23: 5 mg via INTRAVENOUS

## 2022-03-23 MED ORDER — PHENYLEPHRINE 80 MCG/ML (10ML) SYRINGE FOR IV PUSH (FOR BLOOD PRESSURE SUPPORT)
PREFILLED_SYRINGE | INTRAVENOUS | Status: DC | PRN
  Administered 2022-03-23 (×2): 160 ug via INTRAVENOUS
  Administered 2022-03-23: 80 ug via INTRAVENOUS
  Administered 2022-03-23: 160 ug via INTRAVENOUS
  Administered 2022-03-23: 80 ug via INTRAVENOUS
  Administered 2022-03-23: 160 ug via INTRAVENOUS

## 2022-03-23 MED ORDER — CHLORHEXIDINE GLUCONATE 0.12 % MT SOLN: 15.0000 mL | Freq: Once | OROMUCOSAL | Status: AC

## 2022-03-23 MED ORDER — 0.9 % SODIUM CHLORIDE (POUR BTL) OPTIME
TOPICAL | Status: DC | PRN
  Administered 2022-03-23 (×2): 1000 mL

## 2022-03-23 MED ORDER — PROPOFOL 10 MG/ML IV BOLUS
INTRAVENOUS | Status: DC | PRN
  Administered 2022-03-23: 130 mg via INTRAVENOUS

## 2022-03-23 MED ORDER — PROPOFOL 10 MG/ML IV BOLUS
INTRAVENOUS | Status: AC
  Filled 2022-03-23: qty 20

## 2022-03-23 MED ORDER — ACETAMINOPHEN 10 MG/ML IV SOLN
INTRAVENOUS | Status: DC | PRN
  Administered 2022-03-23: 1000 mg via INTRAVENOUS

## 2022-03-23 MED ORDER — LIDOCAINE 2% (20 MG/ML) 5 ML SYRINGE
INTRAMUSCULAR | Status: DC | PRN
  Administered 2022-03-23: 60 mg via INTRAVENOUS

## 2022-03-23 MED ORDER — MIDAZOLAM HCL 2 MG/2ML IJ SOLN
INTRAMUSCULAR | Status: DC | PRN
  Administered 2022-03-23: 2 mg via INTRAVENOUS

## 2022-03-23 MED ORDER — ROCURONIUM BROMIDE 10 MG/ML (PF) SYRINGE
PREFILLED_SYRINGE | INTRAVENOUS | Status: DC | PRN
  Administered 2022-03-23: 50 mg via INTRAVENOUS

## 2022-03-23 MED ORDER — PHENYLEPHRINE 80 MCG/ML (10ML) SYRINGE FOR IV PUSH (FOR BLOOD PRESSURE SUPPORT)
PREFILLED_SYRINGE | INTRAVENOUS | Status: AC
  Filled 2022-03-23: qty 10

## 2022-03-23 MED ORDER — OXYCODONE HCL 5 MG/5ML PO SOLN
5.0000 mg | ORAL | Status: DC | PRN
  Administered 2022-03-24 – 2022-03-25 (×2): 10 mg
  Filled 2022-03-23 (×3): qty 10

## 2022-03-23 MED ORDER — HYDROMORPHONE HCL 1 MG/ML IJ SOLN
0.5000 mg | INTRAMUSCULAR | Status: DC | PRN
  Administered 2022-03-23 – 2022-03-29 (×38): 1 mg via INTRAVENOUS
  Filled 2022-03-23 (×39): qty 1

## 2022-03-23 MED ORDER — ORAL CARE MOUTH RINSE: 15.0000 mL | Freq: Once | OROMUCOSAL | Status: AC

## 2022-03-23 MED ORDER — HYDROMORPHONE HCL 1 MG/ML IJ SOLN
0.2500 mg | INTRAMUSCULAR | Status: DC | PRN
  Administered 2022-03-23: 0.5 mg via INTRAVENOUS

## 2022-03-23 MED ORDER — ROCURONIUM BROMIDE 10 MG/ML (PF) SYRINGE
PREFILLED_SYRINGE | INTRAVENOUS | Status: AC
Start: 2022-03-23 — End: ?
  Filled 2022-03-23: qty 10

## 2022-03-23 MED ORDER — PIPERACILLIN-TAZOBACTAM 3.375 G IVPB
3.3750 g | Freq: Once | INTRAVENOUS | Status: AC
  Administered 2022-03-23: 3.375 g via INTRAVENOUS
  Filled 2022-03-23: qty 50

## 2022-03-23 MED ORDER — LIDOCAINE 2% (20 MG/ML) 5 ML SYRINGE
INTRAMUSCULAR | Status: AC
  Filled 2022-03-23: qty 5

## 2022-03-23 MED ORDER — LACTATED RINGERS IV SOLN: INTRAVENOUS | Status: DC

## 2022-03-23 MED ORDER — ONDANSETRON HCL 4 MG/2ML IJ SOLN
INTRAMUSCULAR | Status: DC | PRN
  Administered 2022-03-23: 4 mg via INTRAVENOUS

## 2022-03-23 MED ORDER — HYDROMORPHONE HCL 1 MG/ML IJ SOLN
INTRAMUSCULAR | Status: AC
  Filled 2022-03-23: qty 1

## 2022-03-23 MED ORDER — DEXAMETHASONE SODIUM PHOSPHATE 10 MG/ML IJ SOLN
INTRAMUSCULAR | Status: AC
  Filled 2022-03-23: qty 1

## 2022-03-23 MED ORDER — FENTANYL CITRATE (PF) 250 MCG/5ML IJ SOLN
INTRAMUSCULAR | Status: DC | PRN
Start: 2022-03-23 — End: 2022-03-23
  Administered 2022-03-23 (×3): 50 ug via INTRAVENOUS

## 2022-03-23 MED ORDER — ACETAMINOPHEN 10 MG/ML IV SOLN
1000.0000 mg | Freq: Four times a day (QID) | INTRAVENOUS | Status: AC
Start: 2022-03-23 — End: 2022-03-24
  Administered 2022-03-23 – 2022-03-24 (×3): 1000 mg via INTRAVENOUS
  Filled 2022-03-23 (×3): qty 100

## 2022-03-23 MED ORDER — ONDANSETRON HCL 4 MG/2ML IJ SOLN
INTRAMUSCULAR | Status: AC
  Filled 2022-03-23: qty 2

## 2022-03-23 MED ORDER — ACETAMINOPHEN 10 MG/ML IV SOLN
INTRAVENOUS | Status: AC
  Filled 2022-03-23: qty 100

## 2022-03-23 MED ORDER — FENTANYL CITRATE (PF) 250 MCG/5ML IJ SOLN
INTRAMUSCULAR | Status: AC
  Filled 2022-03-23: qty 5

## 2022-03-23 SURGICAL SUPPLY — 40 items
APL PRP STRL LF DISP 70% ISPRP (MISCELLANEOUS) ×1
BAG COUNTER SPONGE SURGICOUNT (BAG) ×3 IMPLANT
BAG SPNG CNTER NS LX DISP (BAG) ×1
BINDER ABDOMINAL 12 SM 30-45 (SOFTGOODS) ×1 IMPLANT
BLADE CLIPPER SURG (BLADE) ×1 IMPLANT
CANISTER SUCT 3000ML PPV (MISCELLANEOUS) ×3 IMPLANT
CHLORAPREP W/TINT 26 (MISCELLANEOUS) ×3 IMPLANT
COVER SURGICAL LIGHT HANDLE (MISCELLANEOUS) ×3 IMPLANT
DRAPE LAPAROSCOPIC ABDOMINAL (DRAPES) ×3 IMPLANT
DRAPE WARM FLUID 44X44 (DRAPES) ×3 IMPLANT
DRSG MEPITEL 4X7.2 (GAUZE/BANDAGES/DRESSINGS) ×1 IMPLANT
ELECT CAUTERY BLADE 6.4 (BLADE) ×3 IMPLANT
ELECT REM PT RETURN 9FT ADLT (ELECTROSURGICAL) ×2
ELECTRODE REM PT RTRN 9FT ADLT (ELECTROSURGICAL) ×2 IMPLANT
GLOVE BIOGEL M STRL SZ7.5 (GLOVE) ×3 IMPLANT
GLOVE INDICATOR 8.0 STRL GRN (GLOVE) ×6 IMPLANT
GOWN STRL REUS W/ TWL LRG LVL3 (GOWN DISPOSABLE) ×2 IMPLANT
GOWN STRL REUS W/TWL 2XL LVL3 (GOWN DISPOSABLE) ×3 IMPLANT
GOWN STRL REUS W/TWL LRG LVL3 (GOWN DISPOSABLE) ×2
HANDLE SUCTION POOLE (INSTRUMENTS) ×2 IMPLANT
KIT BASIN OR (CUSTOM PROCEDURE TRAY) ×3 IMPLANT
KIT TURNOVER KIT B (KITS) ×3 IMPLANT
MESH VICRYL KNITTED 12X12 (Mesh General) ×1 IMPLANT
NS IRRIG 1000ML POUR BTL (IV SOLUTION) ×6 IMPLANT
PACK GENERAL/GYN (CUSTOM PROCEDURE TRAY) ×3 IMPLANT
PAD ABD 8X10 STRL (GAUZE/BANDAGES/DRESSINGS) ×1 IMPLANT
PAD ARMBOARD 7.5X6 YLW CONV (MISCELLANEOUS) ×3 IMPLANT
STAPLER VISISTAT 35W (STAPLE) ×3 IMPLANT
SUCTION POOLE HANDLE (INSTRUMENTS) ×2
SUT PDS AB 1 TP1 96 (SUTURE) ×6 IMPLANT
SUT SILK 2 0 SH CR/8 (SUTURE) ×3 IMPLANT
SUT SILK 2 0 TIES 10X30 (SUTURE) ×3 IMPLANT
SUT SILK 3 0 SH CR/8 (SUTURE) ×5 IMPLANT
SUT SILK 3 0 TIES 10X30 (SUTURE) ×3 IMPLANT
SUT VIC AB 2-0 CT1 27 (SUTURE) ×2
SUT VIC AB 2-0 CT1 TAPERPNT 27 (SUTURE) IMPLANT
SUT VIC AB 3-0 SH 18 (SUTURE) IMPLANT
TOWEL GREEN STERILE (TOWEL DISPOSABLE) ×3 IMPLANT
TRAY FOLEY MTR SLVR 16FR STAT (SET/KITS/TRAYS/PACK) ×1 IMPLANT
YANKAUER SUCT BULB TIP NO VENT (SUCTIONS) IMPLANT

## 2022-03-23 NOTE — Anesthesia Procedure Notes (Signed)
Procedure Name: Intubation Date/Time: 03/23/2022 3:44 PM  Performed by: Reece Agar, CRNAPre-anesthesia Checklist: Patient identified, Emergency Drugs available, Suction available and Patient being monitored Patient Re-evaluated:Patient Re-evaluated prior to induction Oxygen Delivery Method: Circle System Utilized Preoxygenation: Pre-oxygenation with 100% oxygen Induction Type: IV induction Ventilation: Mask ventilation without difficulty Laryngoscope Size: Mac and 4 Grade View: Grade I Tube type: Oral Tube size: 7.5 mm Number of attempts: 1 Airway Equipment and Method: Stylet Placement Confirmation: ETT inserted through vocal cords under direct vision, positive ETCO2 and breath sounds checked- equal and bilateral Secured at: 23 cm Tube secured with: Tape Dental Injury: Teeth and Oropharynx as per pre-operative assessment

## 2022-03-23 NOTE — Progress Notes (Addendum)
Mobility Specialist Progress Note:   03/23/22 1020  Mobility  Activity  (bed level exercises)  Range of Motion/Exercises Active;All extremities  Level of Assistance Independent  Assistive Device None  Activity Response Tolerated fair  $Mobility charge 1 Mobility   Pt received in bed, declining OOB mobility d/t nausea however agreeable to bed level exercises. Pt noted to be SOB, SpO2 88% on RA. Donned 1LO2, SpO2 WFL. RN notified. Will return later today to attempt OOB mobility.   Nelta Numbers Acute Rehab Secure Chat or Office Phone: [phone]

## 2022-03-23 NOTE — Op Note (Signed)
03/13/2022 - 03/23/2022  4:26 PM  PATIENT:  Eric [name]  51 y.o. male  PRE-OPERATIVE DIAGNOSIS:  abdominal dehisence; history of exploratory laparotomy lysis of adhesions, cholecystectomy, wedge resection of liver lesions x2 and end ileostomy with side-to-side ileocolonic anastomosis on June 2  POST-OPERATIVE DIAGNOSIS: Same  PROCEDURE:  Procedure(s): EXPLORATORY LAPAROTOMY INSERTION OF VICRYL MESH  SURGEON:  Surgeon(s): Greer Pickerel, MD  ASSISTANTS: none   ANESTHESIA:   general  DRAINS: Urinary Catheter (Foley)   LOCAL MEDICATIONS USED:  NONE  SPECIMEN:  No Specimen  DISPOSITION OF SPECIMEN:  N/A  COUNTS:  YES  INDICATION FOR PROCEDURE: Patient is a 51 year old gentleman who underwent exploratory laparotomy lysis of adhesions, cholecystectomy, wedge resection of liver lesions x2 and end ileostomy takedown with side-to-side ileocolonic anastomosis by Dr. Zenia [name] on June 2.  He had been recovering and had developed an ileus.  He had had a postoperative CT scan late last week which had findings consistent with more of an ileus.  He had been begun on total parenteral nutrition.  On rounds this morning he had had some drainage from around his umbilicus from his midline incision.  The staples were removed and there became evidence of clear fascial separation mainly on the left side.  It appeared to be quite wide.  I recommended reexploration with attempts at possible closure because it was hard to tell at bedside if there was bowel exposed or just friable omentum.  Please see chart for additional details regarding risk and benefits discussion with the patient  PROCEDURE: He was taken to the OR 8 and [facility] placed upon on the operating room table.  General endotracheal anesthesia was established.  Sequential compression devices were placed.  Foley catheter was placed.  The remaining skin staples were removed.  The packing was removed.  His abdomen was then prepped and draped in  the usual standard surgical fashion with Betadine.  A surgical timeout was performed.  He was on antibiotics.  I carefully explored the open portion of the wound which was about 7 inches in length.  The PDS fascial sutures had been pulled through from the left side.  It appeared that there was a significant fascial dehiscence on the left side of the abdomen.  There was some evidence of necrotic nonviable fascia and some necrotic omentum.  I was able to get a finger underneath the fascial edge on the left and get into the abdominal cavity.  I was not able to extend this down but then I got into another area that was clearly in the retrorectus plane.  The posterior rectus sheath was fused to the small bowel on the left side of the abdomen.  There was no plane to separate the posterior rectus sheath from where it it fused with the small bowel.  I felt if continued to try to freed up that I would have multiple enterotomies.  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  I felt there is really nothing I could do to do a formal closure.  The gap in the fascia was too wide to place tension sutures.  Ended up getting a piece of Vicryl mesh and trimming it and sending it to the fascial edges circumferentially as a barrier.  This was done with Vicryl suture.  We then placed moist gauze followed by dry gauze and abdominal binder.  All needle, instrument sponge counts were correct x2.  There is no overt signs of complications.  The patient was  extubated and taken to recovery in stable condition  PLAN OF CARE: already inpt  PATIENT DISPOSITION:  PACU - hemodynamically stable.   Delay start of Pharmacological VTE agent (>24hrs) due to surgical blood loss or risk of bleeding:  no  Eric [name]. Eric Pulling, MD, FACS General, Bariatric, & Minimally Invasive Surgery [facility], [place]

## 2022-03-23 NOTE — Progress Notes (Signed)
PHARMACY - TOTAL PARENTERAL NUTRITION CONSULT NOTE   Indication: Prolonged ileus  Patient Measurements: Height: _0  (190.5 cm) Weight: 75.1 kg (165 lb 9.1 oz) IBW/kg (Calculated) : 84.5 TPN AdjBW (KG): 69.4 Body mass index is 20.69 kg/m. Usual Weight: 165 lb  Assessment: 31 yom with colon cancer with metastases to the liver. He underwent exlap LoA, cholecystectomy, wedge resection of liver lesions x2, and end ileostomy takedown on 6/2. Post-op he was put on clear liquids and was advanced to soft diet on 6/5 but had emesis that night. He is having loose bowel movements and passing flatus but post-op ileus suspected. NG tube was placed 6/7 and output continues to be high. Pharmacy consulted to start TPN.   CLD was initiated on 6/12 at dinner. Pt reports only eating ~1/2 of jello cup and drinking some juice. Pt reports mild nausea after initiating PO diet last night, but no vomiting.   Glucose / Insulin: no hx, A1c 5.8, BG <180, 4 units SSI used in last 24h Electrolytes: Na 131, CO2 21, all others WNL Renal: SCr <1, BUN WNL Hepatic: LFTs/Tbili down WNL, Alk Phos 251, TG 208>>191>>99, albumin 2.1 Intake / Output; MIVF: UOP not documented, 1 BM on 6/11  GI Imaging: 6/7 AXR: gaseous distension of numerous large and small bowel loops throughout the abdomen favoring post-op ileus 6/8 CT A/P: no abscess, mildly prominent small bowel loops may represent adynamic ileus, no high-grade partial obstruction GI Surgeries / Procedures:   Central access: double lumen PICC on 6/9 TPN start date: 03/20/22  Nutritional Goals: Goal TPN rate is 95 mL/hr (provides 111 g of protein, 61 g lipid (~25% kcal) and 2225 kcals per day)  RD Assessment: Estimated Needs Total Energy Estimated Needs: 2200-2400 Total Protein Estimated Needs: 110-125g Total Fluid Estimated Needs: >/=2.2L  Current Nutrition:  Clear liquids and TPN  Plan:  Continue TPN to 40m/hr at 1800 Provides 111 g of protein and 2225  kcals per day meeting 100% needs Electrolytes in TPN: increase Na to 572m/L, K 5956mL, Ca 5mE34m, Mg 5mEq63m and Phos 15mmo71m Change Cl:Ac ratio to 1:2  Add standard MVI and trace elements to TPN Continue Sensitive q6h SSI and adjust as needed  Monitor TPN labs daily until stable on goal TPN, then on Mon/Thurs  Thank you for involving pharmacy in this patient's care.  Sholanda Croson Luisa HartmD, BCPS Clinical Pharmacist 03/23/2022 7:43 AM   Please refer to AMION for pharmacy phone number   **Pharmacist phone directory can be found on [email or web address] [email or web address]

## 2022-03-23 NOTE — Progress Notes (Signed)
Received patient back from PACU.  Patient is awake, drowsy, oriented x 4.  Pain rated at 10/10.  Abdominal binder in place, CDI.  Foley catheter in place.   Assisted in position of comfort, needs addressed.

## 2022-03-23 NOTE — Progress Notes (Signed)
  Staples removed and wound opened at bedside. Met with bloody purulent like discharge. Wound as noted above. Appears suture have completely come apart. Evaluated at bedside with Dr. [name]. Will plan to return to the OR today. Discussed with patient who is in agreement.   Jillyn Ledger, PA-C

## 2022-03-23 NOTE — Transfer of Care (Signed)
Immediate Anesthesia Transfer of Care Note  Patient: Eric [name]  Procedure(s) Performed: EXPLORATORY LAPAROTOMY (Abdomen) INSERTION OF MESH (Abdomen)  Patient Location: PACU  Anesthesia Type:General  Level of Consciousness: awake and alert   Airway & Oxygen Therapy: Patient Spontanous Breathing and Patient connected to face mask oxygen  Post-op Assessment: Report given to RN and Post -op Vital signs reviewed and stable  Post vital signs: Reviewed and stable  Last Vitals:  Vitals Value Taken Time  BP 142/95 03/23/22 1646  Temp 37.1 C 03/23/22 1645  Pulse 102 03/23/22 1651  Resp 25 03/23/22 1651  SpO2 93 % 03/23/22 1651  Vitals shown include unvalidated device data.  Last Pain:  Vitals:   03/23/22 1411  TempSrc: Oral  PainSc: 7       Patients Stated Pain Goal: 4 (76/14/70 9295)  Complications: No notable events documented.

## 2022-03-23 NOTE — Progress Notes (Signed)
Nutrition Follow-up  DOCUMENTATION CODES:  Severe malnutrition in context of chronic illness  INTERVENTION:  Recommend increasing kcal and protein in TPN as pt requires additional nutrition for wound healing. Would not recommend reducing rate and begin weaning from goal until pt is meeting 50% of needs orally and would not recommend discontinuing fully until pt is meeting >85% of needs orally.  NUTRITION DIAGNOSIS:  Severe Malnutrition related to chronic illness (metastatic colon cancer) as evidenced by severe fat depletion, severe muscle depletion. - remains applicable  GOAL:  Patient will meet greater than or equal to 90% of their needs - being addressed with TPN  MONITOR:  Diet advancement, Labs, Weight trends, I & O's  REASON FOR ASSESSMENT:  Consult New TPN/TNA  ASSESSMENT:  Pt with COPD and metastatic colon cancer, admitted for open ileostomy reversal and open hepatic wedge resection x2.  06/02: s/p ileostomy reversal, hepatic wedge resection 06/06: developed post-op ileus; NGT placed 06/09: PICC line placed, TPN initiated  NGT removed 6/11 after pt tolerated initiation of clears.   Pt not in room at the attempted time of visit. Reviewed chart and noted that on evaluation this AM, drainage was noted from the midline wound. This afternoon, staples removed and wound was opened at bedside. Bloody purulent discharge draining. Pt taken back to the OR this afternoon as sutures had separated.  Given the repeat surgery and worsening appearance of wound, pt would benefit from additional kcal and protein. Current TPN regimen is meet the low end of needs. Will readjust needs and reach out to pharmacy team.  Average Meal Intake: 6/2-6/12: 30% intake x 2 recorded meals  Nutritionally Relevant Medications: Scheduled Meds:  insulin aspart  0-9 Units Subcutaneous Q6H   pantoprazole IV  40 mg Intravenous Daily   Continuous Infusions:  methocarbamol (ROBAXIN) IV 200 mL/hr at  03/23/22 0625   TPN ADULT (ION) 95 mL/hr at 03/23/22 4270   TPN ADULT (ION)     PRN Meds: diphenhydrAMINE, ondansetron  Labs Reviewed: Sodium 131 CBG ranges from 127-141 mg/dL over the last 24 hours HgbA1c 5.8% Triglycerides 99  NUTRITION - FOCUSED PHYSICAL EXAM: Flowsheet Row Most Recent Value  Orbital Region Severe depletion  Upper Arm Region Severe depletion  Thoracic and Lumbar Region Moderate depletion  Buccal Region Severe depletion  Temple Region Severe depletion  Clavicle Bone Region Severe depletion  Clavicle and Acromion Bone Region Severe depletion  Scapular Bone Region Severe depletion  Dorsal Hand Severe depletion  Patellar Region Severe depletion  Anterior Thigh Region Severe depletion  Posterior Calf Region Moderate depletion  Edema (RD Assessment) None  Hair Reviewed  Eyes Reviewed  Mouth Other (Comment)  [poor dentition, wears dentures]  Skin Reviewed  Nails Other (Comment)  [yellow]    Diet Order:   Diet Order             Diet NPO time specified  Diet effective now                   EDUCATION NEEDS:  Education needs have been addressed  Skin:  Skin Assessment: Skin Integrity Issues: Skin Integrity Issues:: Incisions Incisions: abdomen (closed)  Last BM:  6/11 - type 7  Height:  Ht Readings from Last 1 Encounters:  03/13/22 '6\' 3"'$  (1.905 m)    Weight:  Wt Readings from Last 1 Encounters:  03/23/22 75.1 kg   BMI:  Body mass index is 20.69 kg/m.  Estimated Nutritional Needs:  Kcal:  2400-2600 kcal/d Protein:  125-140 g/d  Fluid:  2.4-2.6 L/d   Ranell Patrick, RD, LDN Clinical Dietitian RD pager # available in [facility]  After hours/weekend pager # available in [facility]

## 2022-03-23 NOTE — Progress Notes (Signed)
10 Days Post-Op  Subjective: CC: NGT out. Taking in small amount of clears without n/v. Having pain around his incision. Passing flatus. No bm. Up to chair yesterday. No cp. No sob at rest but does get sob with activity. On 1L. Voiding.    Objective: Vital signs in last 24 hours: Temp:  [98.5 F (36.9 C)-99.1 F (37.3 C)] 98.5 F (36.9 C) (06/12 0750) Pulse Rate:  [91-111] 111 (06/12 0752) Resp:  [16-22] 20 (06/12 0752) BP: (148-152)/(94-100) 149/94 (06/12 0750) SpO2:  [92 %-96 %] 92 % (06/12 0752) Weight:  [75.1 kg] 75.1 kg (06/12 0559) Last BM Date : 03/22/22  Intake/Output from previous day: 06/11 0701 - 06/12 0700 In: 1629.3 [I.V.:1261.6; IV Piggyback:367.7] Out: 600 [Urine:600] Intake/Output this shift: Total I/O In: 500 [P.O.:120; I.V.:380] Out: 400 [Urine:400]  PE: Gen:  Alert, NAD, pleasant HEENT: EOM's intact, pupils equal and round Card:  Tachycardic with regular rhythm  Pulm:  His lungs appeared cta b/l on my exam. Normal rate and effort. On 1L Abd: Soft, mild distension but +bs. Tenderness around his midline incision that appears appropriate. No peritonitis. Midline wound dressing with some foul smelling bloody purulent discharge near/below the umbilicus. I was able to take a sterile cotton swab and go between staples to open the incision slightly and evacuate similar fluid. Likely will need some staples to come out today. No cellulitis of skin. Colostomy takedown site clean. Ext:  No LE edema or calf tenderness Psych: A&Ox3  Skin: no rashes noted, warm and dry    Lab Results:  Recent Labs    03/22/22 0123 03/23/22 0346  WBC 13.6* 14.6*  HGB 7.9* 7.8*  HCT 23.9* 24.2*  PLT 313 351   BMET Recent Labs    03/22/22 0123 03/23/22 0346  NA 133* 131*  K 3.4* 4.3  CL 101 100  CO2 24 21*  GLUCOSE 128* 131*  BUN 13 11  CREATININE 0.66 0.62  CALCIUM 7.7* 8.0*   PT/INR No results for input(s): "LABPROT", "INR" in the last 72 hours. CMP      Component Value Date/Time   NA 131 (L) 03/23/2022 0346   K 4.3 03/23/2022 0346   CL 100 03/23/2022 0346   CO2 21 (L) 03/23/2022 0346   GLUCOSE 131 (H) 03/23/2022 0346   BUN 11 03/23/2022 0346   CREATININE 0.62 03/23/2022 0346   CREATININE 1.18 02/20/2022 1117   CALCIUM 8.0 (L) 03/23/2022 0346   PROT 6.2 (L) 03/23/2022 0346   ALBUMIN 2.1 (L) 03/23/2022 0346   AST 28 03/23/2022 0346   AST 18 02/20/2022 1117   ALT 32 03/23/2022 0346   ALT 12 02/20/2022 1117   ALKPHOS 251 (H) 03/23/2022 0346   BILITOT 0.9 03/23/2022 0346   BILITOT 0.6 02/20/2022 1117   GFRNONAA >60 03/23/2022 0346   GFRNONAA >60 02/20/2022 1117   Lipase  No results found for: "LIPASE"  Studies/Results: No results found.  Anti-infectives: Anti-infectives (From admission, onward)    Start     Dose/Rate Route Frequency Ordered Stop   03/13/22 0645  cefoTEtan (CEFOTAN) 2 g in sodium chloride 0.9 % 100 mL IVPB        2 g 200 mL/hr over 30 Minutes Intravenous On call to O.R. 03/13/22 6378 03/13/22 0926        Assessment/Plan POD10 s/p ex lap, intraoperative ultrasound of the liver, cholecystectomy, hepatic wedge resection x2 and ileostomy reversal by Dr. [name] on 6/2 for hx of stage IV colon cancer with  liver metastases s/p R hemicolectomy and chemo - Postoperative ileus: NGT out. Has some ROBF. Keep on CLD today - Cont TPN - Will discuss with MD about removing some of the staples from midline wound for drainage noted on exam today. There is no cellulitis noted but this may explain the patient's wbc with reassuring CT A/P recently - Surg path showed no malignancy in hepatic wedge resections, indicating necrotic tumor per Dr. Ayesha Rumpf prior progress notes - Ambulate, pulmonary toilet, IS  FEN - CLD, TPN VTE - SCDs, Lovenox ID - Cefotetan periop. None currently. Afebrile. WBC up from 13.6 > 14.6. Tachy 110's Foley - None, voiding  COPD: Now O2. Wean as able. Continue home Breo ellipta, prn albuterol.  Aggressive pulmonary toilet. CTA 6/8 w/o evidence of PE R/L pleural effusions: Noted on CTA 6/8. Monitor. Wean o2 as able. Pulm toilet.    LOS: 10 days    Jillyn Ledger , [facility] Surgery 03/23/2022, 11:37 AM Please see Amion for pager number during day hours 7:00am-4:30pm

## 2022-03-23 NOTE — Anesthesia Postprocedure Evaluation (Signed)
Anesthesia Post Note  Patient: Eric [name]  Procedure(s) Performed: EXPLORATORY LAPAROTOMY (Abdomen) INSERTION OF MESH (Abdomen)     Patient location during evaluation: PACU Anesthesia Type: General Level of consciousness: awake and alert Pain management: pain level controlled Vital Signs Assessment: post-procedure vital signs reviewed and stable Respiratory status: spontaneous breathing, nonlabored ventilation, respiratory function stable and patient connected to nasal cannula oxygen Cardiovascular status: blood pressure returned to baseline and stable Postop Assessment: no apparent nausea or vomiting Anesthetic complications: no   No notable events documented.  Last Vitals:  Vitals:   03/23/22 1715 03/23/22 1730  BP: (!) 129/93 (!) 132/91  Pulse: 94 93  Resp: 20 15  Temp:  37.1 C  SpO2: 94% 96%    Last Pain:  Vitals:   03/23/22 1730  TempSrc:   PainSc: Asleep                 Alvar Malinoski,W. EDMOND

## 2022-03-23 NOTE — Anesthesia Preprocedure Evaluation (Addendum)
Anesthesia Evaluation  Patient identified by MRN, date of birth, ID band Patient awake    Reviewed: Allergy & Precautions, H&P , NPO status , Patient's Chart, lab work & pertinent test results  Airway Mallampati: I  TM Distance: >3 FB Neck ROM: Full    Dental no notable dental hx. (+) Edentulous Upper, Partial Lower, Dental Advisory Given   Pulmonary COPD,  COPD inhaler, Current Smoker and Patient abstained from smoking.,    Pulmonary exam normal breath sounds clear to auscultation       Cardiovascular + DOE  negative cardio ROS   Rhythm:Regular Rate:Normal     Neuro/Psych negative neurological ROS  negative psych ROS   GI/Hepatic negative GI ROS, Neg liver ROS,   Endo/Other  negative endocrine ROS  Renal/GU negative Renal ROS  negative genitourinary   Musculoskeletal   Abdominal   Peds  Hematology  (+) Blood dyscrasia, anemia ,   Anesthesia Other Findings   Reproductive/Obstetrics negative OB ROS                            Anesthesia Physical Anesthesia Plan  ASA: 3  Anesthesia Plan: General   Post-op Pain Management: Ofirmev IV (intra-op)*   Induction: Intravenous  PONV Risk Score and Plan: 2 and Ondansetron, Dexamethasone and Midazolam  Airway Management Planned: Oral ETT  Additional Equipment:   Intra-op Plan:   Post-operative Plan: Extubation in OR  Informed Consent: I have reviewed the patients History and Physical, chart, labs and discussed the procedure including the risks, benefits and alternatives for the proposed anesthesia with the patient or authorized representative who has indicated his/her understanding and acceptance.     Dental advisory given  Plan Discussed with: CRNA  Anesthesia Plan Comments:         Anesthesia Quick Evaluation

## 2022-03-24 ENCOUNTER — Encounter (HOSPITAL_COMMUNITY): Payer: Self-pay | Admitting: General Surgery

## 2022-03-24 LAB — CBC
HCT: 22.6 % — ABNORMAL LOW (ref 39.0–52.0)
Hemoglobin: 7.1 g/dL — ABNORMAL LOW (ref 13.0–17.0)
MCH: 28.7 pg (ref 26.0–34.0)
MCHC: 31.4 g/dL (ref 30.0–36.0)
MCV: 91.5 fL (ref 80.0–100.0)
Platelets: 347 10*3/uL (ref 150–400)
RBC: 2.47 MIL/uL — ABNORMAL LOW (ref 4.22–5.81)
RDW: 15.9 % — ABNORMAL HIGH (ref 11.5–15.5)
WBC: 13.8 10*3/uL — ABNORMAL HIGH (ref 4.0–10.5)
nRBC: 0 % (ref 0.0–0.2)

## 2022-03-24 LAB — BASIC METABOLIC PANEL
Anion gap: 7 (ref 5–15)
BUN: 14 mg/dL (ref 6–20)
CO2: 23 mmol/L (ref 22–32)
Calcium: 8.1 mg/dL — ABNORMAL LOW (ref 8.9–10.3)
Chloride: 100 mmol/L (ref 98–111)
Creatinine, Ser: 0.62 mg/dL (ref 0.61–1.24)
GFR, Estimated: >60 mL/min (ref >60)
Glucose, Bld: 179 mg/dL — ABNORMAL HIGH (ref 70–99)
Potassium: 4.9 mmol/L (ref 3.5–5.1)
Sodium: 130 mmol/L — ABNORMAL LOW (ref 135–145)

## 2022-03-24 LAB — GLUCOSE, CAPILLARY
Glucose-Capillary: 187 mg/dL — ABNORMAL HIGH (ref 70–99)
Glucose-Capillary: 158 mg/dL — ABNORMAL HIGH (ref 70–99)
Glucose-Capillary: 162 mg/dL — ABNORMAL HIGH (ref 70–99)

## 2022-03-24 MED ORDER — TRACE MINERALS CU-MN-SE-ZN 300-55-60-3000 MCG/ML IV SOLN
INTRAVENOUS | Status: AC
  Filled 2022-03-24: qty 866.4

## 2022-03-24 NOTE — Consult Note (Signed)
WOC Nurse Consult Note: Reason for Consult: Consulted by Evette Cristal, CS PA-C for dressing selection for midline wound with dehiscence and placement of vicryl mesh sutured to fascial edges with bowel immediately beneath. Wound type: Pressure Injury POA: N/A  Dressing procedure/placement/frequency: Recommend placement of Mepitel nonadherent silicone dressing over wound topped with saline moistened gauze (opened) to cover. Moistened gauze is to be covered with dry gauze 4x4s and topped with an ABD pad secured with paper tape. This is to be change three (3) times daily, ideally every 8 hours. The Mepitel silicone wound contact layer may be used for up to 3 days; change PRN for folding of dressing edges or dressing dislodgement.  The abdominal binder is to be placed for support and must be in place any time the patient is OOB.  [place] nursing team will not follow, but will remain available to this patient, the nursing and medical teams.  Please re-consult if needed.  Thank you for inviting [place] to participate in this patient's Plan of Care.  Maudie Flakes, MSN, RN, CNS, [place], Serita Grammes, [employer], [employer] phone:  [phone]

## 2022-03-24 NOTE — Progress Notes (Signed)
Mobility Specialist Progress Note:   03/24/22 0935  Mobility  Activity  (bed level exercises)  Range of Motion/Exercises Active;All extremities  Level of Assistance Independent  Assistive Device None  Activity Response Tolerated well  $Mobility charge 1 Mobility   Pt declining OOB mobility this am d/t "not feeling well". Performed multiple bed level exercises, tolerated well. States he wants to get OOB later today once pain is controlled. Left with all needs met, will f/u later today for second session.   Nelta Numbers Acute Rehab Secure Chat or Office Phone: [phone]

## 2022-03-24 NOTE — TOC Initial Note (Addendum)
Transition of Care [facility]) - Initial/Assessment Note    Patient Details  Name: Eric [name] MRN: [id number] Date of Birth: 1971-03-10  Transition of [facility]) CM/SW Contact:    Marilu Favre, RN Phone Number: 03/24/2022, 1:44 PM  Clinical Narrative:                 Spoke to patient at bedside. Patient from home alone, however his brother is going to move in with him.   Confirmed face sheet information.   PCP Ferd Hibbs   Discussed prior to discharge nurse will teach him and brother wound care. If HHRN order HHRN will not be there daily for dressing changes . Patient voiced understanding.   Patient has not had home health before , no preference. [facility] with [facility] can accept . Will need HHRN order .  Expected Discharge Plan: [place] Barriers to Discharge: Continued Medical Work up   Patient Goals and CMS Choice Patient states their goals for this hospitalization and ongoing recovery are:: to return to home CMS Medicare.gov Compare Post Acute Care list provided to:: Patient Choice offered to / list presented to : Patient  Expected Discharge Plan and Services Expected Discharge Plan: [place]   Discharge Planning Services: CM Consult Post Acute Care Choice: [place] arrangements for the past 2 months: Single Family Home                 DME Arranged: N/A DME Agency: NA                  Prior Living Arrangements/Services Living arrangements for the past 2 months: Single Family Home Lives with:: Self Patient language and need for interpreter reviewed:: Yes        Need for Family Participation in Patient Care: Yes (Comment) Care giver support system in place?: Yes (comment)   Criminal Activity/Legal Involvement Pertinent to Current Situation/Hospitalization: No - Comment as needed  Activities of Daily Living Home Assistive Devices/Equipment: Eyeglasses, Scales, Dentures (specify type) (Upper and lower  dentures) ADL Screening (condition at time of admission) Patient's cognitive ability adequate to safely complete daily activities?: Yes Is the patient deaf or have difficulty hearing?: No Does the patient have difficulty seeing, even when wearing glasses/contacts?: Yes Does the patient have difficulty concentrating, remembering, or making decisions?: No Patient able to express need for assistance with ADLs?: Yes Does the patient have difficulty dressing or bathing?: No Independently performs ADLs?: Yes (appropriate for developmental age) Does the patient have difficulty walking or climbing stairs?: Yes Weakness of Legs: None Weakness of Arms/Hands: None  Permission Sought/Granted   Permission granted to share information with : No              Emotional Assessment Appearance:: Appears stated age Attitude/Demeanor/Rapport: Engaged Affect (typically observed): Accepting Orientation: : Oriented to Self, Oriented to Place, Oriented to  Time, Oriented to Situation Alcohol / Substance Use: Not Applicable Psych Involvement: No (comment)  Admission diagnosis:  Colon cancer metastasized to liver ([place]) [C18.9, C78.7] Patient Active Problem List   Diagnosis Date Noted   Ileus, postoperative ([place]) 03/18/2022   Colon cancer metastasized to liver ([place]) 03/13/2022   Centrilobular emphysema ([place]) 02/04/2022   SOB (shortness of breath) 09/23/2021   DOE (dyspnea on exertion) - rule out PE 09/22/2021   Dyspnea on exertion 09/22/2021   Goals of care, counseling/discussion 12/18/2020   Perforated colon cancer s/p right colectomy/ileostomy 11/28/2020 12/01/2020   Malnutrition of  moderate degree 12/01/2020   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 ([place]) 11/21/2020   Protein-calorie malnutrition, severe ([place]) 11/18/2020   Liver mass, right lobe 11/18/2020   Right groin pain 11/18/2020   Ambulates with cane 11/18/2020   Tobacco abuse 11/18/2020   Fatigue 11/18/2020   Symptomatic anemia  11/18/2020   Colonic mass 11/15/2020   Iron deficiency anemia due to chronic blood loss 11/15/2020   PCP:  Ferd Hibbs, NP Pharmacy:   [facility] [place] [address] Phone: [phone] Fax: [phone]  [facility] [address] [place] [address] Phone: [phone] Fax: [phone]     Social Determinants of Health (SDOH) Interventions    Readmission Risk Interventions     No data to display

## 2022-03-24 NOTE — Progress Notes (Signed)
1 Day Post-Op  Subjective: CC: Pain near base of incision. Has not had any cld since surgery. No n/v. No flatus or bm since surgery. Has not been oob since surgery. Foley in place with good uop. Denies cp or sob.   Objective: Vital signs in last 24 hours: Temp:  [97.6 F (36.4 C)-98.7 F (37.1 C)] 97.9 F (36.6 C) (06/13 0837) Pulse Rate:  [78-103] 92 (06/13 0837) Resp:  [15-26] 20 (06/13 0837) BP: (114-149)/(81-101) 114/81 (06/13 0837) SpO2:  [92 %-99 %] 99 % (06/13 0837) Weight:  [77 kg] 77 kg (06/13 0449) Last BM Date : 03/22/22  Intake/Output from previous day: 06/12 0701 - 06/13 0700 In: 3258.1 [P.O.:120; I.V.:2288.1; IV Piggyback:750] Out: 1610 [Urine:3040; Blood:10] Intake/Output this shift: No intake/output data recorded.  PE: Gen:  Alert, NAD, pleasant Card:  RRR Pulm:  His lungs appeared cta b/l on my exam. Normal rate and effort. On 2L Abd: Soft, mild distension, +bs. Tenderness around lower abdomen and midline wound but no peritonitis. Colostomy takedown site clean. Superior aspect of midline wound c/d/I. Inferior aspect open with vicryl mesh at the base as noted in picture below.  Ext:  No LE edema Psych: A&Ox3  Skin: no rashes noted, warm and dry    Lab Results:  Recent Labs    03/23/22 0346 03/24/22 0457  WBC 14.6* 13.8*  HGB 7.8* 7.1*  HCT 24.2* 22.6*  PLT 351 347   BMET Recent Labs    03/23/22 0346 03/24/22 0457  NA 131* 130*  K 4.3 4.9  CL 100 100  CO2 21* 23  GLUCOSE 131* 179*  BUN 11 14  CREATININE 0.62 0.62  CALCIUM 8.0* 8.1*   PT/INR No results for input(s): "LABPROT", "INR" in the last 72 hours. CMP     Component Value Date/Time   NA 130 (L) 03/24/2022 0457   K 4.9 03/24/2022 0457   CL 100 03/24/2022 0457   CO2 23 03/24/2022 0457   GLUCOSE 179 (H) 03/24/2022 0457   BUN 14 03/24/2022 0457   CREATININE 0.62 03/24/2022 0457   CREATININE 1.18 02/20/2022 1117   CALCIUM 8.1 (L) 03/24/2022 0457   PROT 6.2 (L) 03/23/2022  0346   ALBUMIN 2.1 (L) 03/23/2022 0346   AST 28 03/23/2022 0346   AST 18 02/20/2022 1117   ALT 32 03/23/2022 0346   ALT 12 02/20/2022 1117   ALKPHOS 251 (H) 03/23/2022 0346   BILITOT 0.9 03/23/2022 0346   BILITOT 0.6 02/20/2022 1117   GFRNONAA >60 03/24/2022 0457   GFRNONAA >60 02/20/2022 1117   Lipase  No results found for: "LIPASE"  Studies/Results: No results found.  Anti-infectives: Anti-infectives (From admission, onward)    Start     Dose/Rate Route Frequency Ordered Stop   03/23/22 1330  piperacillin-tazobactam (ZOSYN) IVPB 3.375 g        3.375 g 12.5 mL/hr over 240 Minutes Intravenous  Once 03/23/22 1239 03/23/22 1715   03/13/22 0645  cefoTEtan (CEFOTAN) 2 g in sodium chloride 0.9 % 100 mL IVPB        2 g 200 mL/hr over 30 Minutes Intravenous On call to O.R. 03/13/22 9604 03/13/22 0926        Assessment/Plan POD 11 s/p ex lap, intraoperative ultrasound of the liver, cholecystectomy, hepatic wedge resection x2 and ileostomy reversal by Dr. [name] on 6/2 for hx of stage IV colon cancer with liver metastases s/p R hemicolectomy and chemo POD 1 s/p exploratory laparotomy with insertion of vicryl mesh  for abdominal dehiscence by Dr. [name] on 03/23/22 - Please see Dr. [name] op note from yesterday. Unable to perform formal closure safely; a piece of Vicryl mesh is sewed to the fascial edges circumferentially as a barrier. Will consult WOCN to see what the most protective dressing would be moving forward (Mepitel at base + wtd vs white sponge at base + wound vac etc).  - Cont TPN. Allow CLD - Surg path showed no malignancy in hepatic wedge resections, indicating necrotic tumor per Dr. Ayesha Rumpf prior progress notes - Foley out, TOV - AM labs  - Ambulate, pulmonary toilet, IS   FEN - CLD, TPN VTE - SCDs, Lovenox ID - Cefotetan periop. None currently. Afebrile. WBC 13.8. Tachycardia resolved.  Foley - D/c TOV.    ABL anemia - hgb 7.1. No tachycardia or hypotension this am.  Denies symptoms (but has not been oob). Will write for am labs.  Hyponatremia - 130, monitor. Repeat labs in am, may need further workup COPD: On O2. Wean as able. Continue home Breo ellipta, prn albuterol. Aggressive pulmonary toilet. CTA 6/8 w/o evidence of PE R/L pleural effusions: Noted on CTA 6/8. Monitor. Wean o2 as able. Pulm toilet.   LOS: 11 days    Jillyn Ledger , [facility] Surgery 03/24/2022, 9:57 AM Please see Amion for pager number during day hours 7:00am-4:30pm

## 2022-03-24 NOTE — Progress Notes (Signed)
PHARMACY - TOTAL PARENTERAL NUTRITION CONSULT NOTE   Indication: Prolonged ileus  Patient Measurements: Height: _0  (190.5 cm) Weight: 77 kg (169 lb 12.1 oz) IBW/kg (Calculated) : 84.5 TPN AdjBW (KG): 69.4 Body mass index is 21.22 kg/m. Usual Weight: 165 lb  Assessment: 76 yom with colon cancer with metastases to the liver. He underwent exlap LoA, cholecystectomy, wedge resection of liver lesions x2, and end ileostomy takedown on 6/2. Post-op he was put on clear liquids and was advanced to soft diet on 6/5 but had emesis that night. He is having loose bowel movements and passing flatus but post-op ileus suspected. NG tube was placed 6/7 and output continues to be high. Pharmacy consulted to start TPN.   CLD was initiated on 6/12 at dinner. Pt reports only eating ~1/2 of jello cup and drinking some juice. Pt reports mild nausea after initiating PO diet last night, but no vomiting.   Glucose / Insulin: no hx, A1c 5.8, BG <180, 6 units SSI used in last 24h Electrolytes: Na 130, CO2 23, all others WNL Renal: SCr <1, BUN WNL Hepatic: LFTs/Tbili down WNL, Alk Phos 251, TG 208>>191>>99, albumin 2.1 Intake / Output; MIVF: UOP 30m/kg/hr, 1 BM on 6/11  GI Imaging: 6/7 AXR: gaseous distension of numerous large and small bowel loops throughout the abdomen favoring post-op ileus 6/8 CT A/P: no abscess, mildly prominent small bowel loops may represent adynamic ileus, no high-grade partial obstruction GI Surgeries / Procedures:  6/12 - went back to the OR for wound dehisence - got ex lap with insertion of mesh  Central access: double lumen PICC on 6/9 TPN start date: 03/20/22  Nutritional Goals: Goal TPN rate is 95 mL/hr (provides 130 g of protein, 83 g lipid and 2400 kcals per day)  RD Assessment: Estimated Needs Total Energy Estimated Needs: 2400-2600 kcal/d Total Protein Estimated Needs: 125-140 g/d Total Fluid Estimated Needs: 2.4-2.6 L/d  Current Nutrition:  Clear liquids and  TPN  Plan:  Continue TPN to 922mhr at 1800 Provides 130 g of protein and 2400 kcals per day meeting 100% needs Electrolytes in TPN: increase Na to 6550mL, K 59m7m, Ca 5mEq82m Mg 5mEq/59mand Phos 15mmol42mChange Cl:Ac ratio to 1:2  Add standard MVI and trace elements to TPN Continue Sensitive q6h SSI and adjust as needed  Monitor TPN labs daily until stable on goal TPN, then on Mon/Thurs  Thank you for involving pharmacy in this patient's care.  Cathy PAlanda SlimD, FCCM [facility] Pharmacist Please see AMION for all Pharmacists' Contact Phone Numbers 03/24/2022, 7:13 AM

## 2022-03-25 LAB — COMPREHENSIVE METABOLIC PANEL
ALT: 37 U/L (ref 0–44)
AST: 28 U/L (ref 15–41)
Albumin: 2.3 g/dL — ABNORMAL LOW (ref 3.5–5.0)
Alkaline Phosphatase: 254 U/L — ABNORMAL HIGH (ref 38–126)
Anion gap: 8 (ref 5–15)
BUN: 16 mg/dL (ref 6–20)
CO2: 23 mmol/L (ref 22–32)
Calcium: 8.3 mg/dL — ABNORMAL LOW (ref 8.9–10.3)
Chloride: 100 mmol/L (ref 98–111)
Creatinine, Ser: 0.65 mg/dL (ref 0.61–1.24)
GFR, Estimated: >60 mL/min (ref >60)
Glucose, Bld: 125 mg/dL — ABNORMAL HIGH (ref 70–99)
Potassium: 4.7 mmol/L (ref 3.5–5.1)
Sodium: 131 mmol/L — ABNORMAL LOW (ref 135–145)
Total Bilirubin: 0.7 mg/dL (ref 0.3–1.2)
Total Protein: 6.7 g/dL (ref 6.5–8.1)

## 2022-03-25 LAB — BASIC METABOLIC PANEL
Anion gap: 7 (ref 5–15)
BUN: 15 mg/dL (ref 6–20)
CO2: 22 mmol/L (ref 22–32)
Calcium: 8.2 mg/dL — ABNORMAL LOW (ref 8.9–10.3)
Chloride: 99 mmol/L (ref 98–111)
Creatinine, Ser: 0.67 mg/dL (ref 0.61–1.24)
GFR, Estimated: >60 mL/min (ref >60)
Glucose, Bld: 113 mg/dL — ABNORMAL HIGH (ref 70–99)
Potassium: 5.1 mmol/L (ref 3.5–5.1)
Sodium: 128 mmol/L — ABNORMAL LOW (ref 135–145)

## 2022-03-25 LAB — CBC
HCT: 24.5 % — ABNORMAL LOW (ref 39.0–52.0)
Hemoglobin: 7.7 g/dL — ABNORMAL LOW (ref 13.0–17.0)
MCH: 28.7 pg (ref 26.0–34.0)
MCHC: 31.4 g/dL (ref 30.0–36.0)
MCV: 91.4 fL (ref 80.0–100.0)
Platelets: 457 10*3/uL — ABNORMAL HIGH (ref 150–400)
RBC: 2.68 MIL/uL — ABNORMAL LOW (ref 4.22–5.81)
RDW: 16.1 % — ABNORMAL HIGH (ref 11.5–15.5)
WBC: 12.9 10*3/uL — ABNORMAL HIGH (ref 4.0–10.5)
nRBC: 0 % (ref 0.0–0.2)

## 2022-03-25 LAB — GLUCOSE, CAPILLARY
Glucose-Capillary: 128 mg/dL — ABNORMAL HIGH (ref 70–99)
Glucose-Capillary: 129 mg/dL — ABNORMAL HIGH (ref 70–99)
Glucose-Capillary: 137 mg/dL — ABNORMAL HIGH (ref 70–99)
Glucose-Capillary: 144 mg/dL — ABNORMAL HIGH (ref 70–99)

## 2022-03-25 LAB — OSMOLALITY: Osmolality: 277 mOsm/kg (ref 275–295)

## 2022-03-25 MED ORDER — OXYCODONE HCL 5 MG/5ML PO SOLN
5.0000 mg | ORAL | Status: DC | PRN
  Administered 2022-03-25 – 2022-03-26 (×2): 10 mg via ORAL
  Filled 2022-03-25 (×2): qty 10

## 2022-03-25 MED ORDER — TRACE MINERALS CU-MN-SE-ZN 300-55-60-3000 MCG/ML IV SOLN
INTRAVENOUS | Status: AC
  Filled 2022-03-25: qty 866.4

## 2022-03-25 MED ORDER — ENSURE MAX PROTEIN PO LIQD
11.0000 [oz_av] | Freq: Every day | ORAL | Status: DC
  Administered 2022-03-25 – 2022-03-26 (×2): 11 [oz_av] via ORAL
  Filled 2022-03-25 (×3): qty 330

## 2022-03-25 NOTE — Progress Notes (Signed)
PHARMACY - TOTAL PARENTERAL NUTRITION CONSULT NOTE   Indication: Prolonged ileus  Patient Measurements: Height: _0  (190.5 cm) Weight: 77 kg (169 lb 12.1 oz) IBW/kg (Calculated) : 84.5 TPN AdjBW (KG): 69.4 Body mass index is 21.22 kg/m. Usual Weight: 165 lb  Assessment: 79 yom with colon cancer with metastases to the liver. He underwent exlap LoA, cholecystectomy, wedge resection of liver lesions x2, and end ileostomy takedown on 6/2. Post-op he was put on clear liquids and was advanced to soft diet on 6/5 but had emesis that night. He is having loose bowel movements and passing flatus but post-op ileus suspected. NG tube was placed 6/7 and output continues to be high. Pharmacy consulted to start TPN.   CLD was initiated on 6/12 at dinner. Pt reports only eating ~1/2-3/4 of jello cup and drinking ~1/2 of juice cup twice daily with minimal water intake. Pt reports mild nausea that responds to antiemetics, but no vomiting.   Glucose / Insulin: no hx, A1c 5.8, BG <180, 6 units SSI used in last 24h Electrolytes: Na 128, CoCa 9.7 all others WNL Renal: SCr <1, BUN WNL Hepatic: LFTs/Tbili down WNL, Alk Phos 251, TG 208>>191>>99, albumin 2.1 Intake / Output; MIVF: UOP not documented, LBM on 6/11  GI Imaging: 6/7 AXR: gaseous distension of numerous large and small bowel loops throughout the abdomen favoring post-op ileus 6/8 CT A/P: no abscess, mildly prominent small bowel loops may represent adynamic ileus, no high-grade partial obstruction GI Surgeries / Procedures:  6/12 - went back to the OR for wound dehisence - got ex lap with insertion of mesh  Central access: double lumen PICC on 6/9 TPN start date: 03/20/22  Nutritional Goals: Goal TPN rate is 95 mL/hr (provides 130 g of protein, 83 g lipid and 2478 kcals per day)  RD Assessment: Estimated Needs Total Energy Estimated Needs: 2400-2600 kcal/d Total Protein Estimated Needs: 125-140 g/d Total Fluid Estimated Needs: 2.4-2.6  L/d  Current Nutrition:  Clear liquids and TPN  Plan:  Continue TPN at goal rate of 40m/hr  Provides 130 g of protein and 2478 kcals per day meeting 100% needs Electrolytes in TPN: increase Na to 771m/L, decrease K to 4560mL, Ca 5mE47m, Mg 5mEq15m Phos 15mmo75m and Cl:Ac ratio to 1:2  Add standard MVI and trace elements to TPN Continue Sensitive q6h SSI and adjust as needed  Monitor TPN labs daily until stable on goal TPN, then on Mon/Thurs  Thank you for involving pharmacy in this patient's care.  Jenavive Lamboy Luisa HartmD, BCPS Clinical Pharmacist 03/25/2022 9:46 AM   Please refer to AMION for pharmacy phone number

## 2022-03-25 NOTE — Progress Notes (Addendum)
2 Days Post-Op  Subjective: CC: Patient reports he feels better then yesterday. Still having some pain around his lower incision. No other areas of pain. Tolerating cld. Still has some nausea occasionally but this has improved this am. No emesis. Passing flatus. No BM. Voiding since foley removal. Did not get out of bed yesterday but plans ot get up with therapies today. No cp or sob. On 2L. Using IS and pulling 750.   Objective: Vital signs in last 24 hours: Temp:  [97.9 F (36.6 C)-99 F (37.2 C)] 98.3 F (36.8 C) (06/14 0845) Pulse Rate:  [82-111] 102 (06/14 0845) Resp:  [18-24] 18 (06/14 0845) BP: (126-154)/(84-98) 126/84 (06/14 0845) SpO2:  [96 %-99 %] 96 % (06/14 0845) Last BM Date : 03/22/22  Intake/Output from previous day: 06/13 0701 - 06/14 0700 In: 1604 [I.V.:1105.7; IV Piggyback:498.3] Out: -  Intake/Output this shift: Total I/O In: 50 [P.O.:50] Out: 300 [Urine:300]  PE: Gen:  Alert, NAD, pleasant Card:  Tachycardic in the 100s Pulm:  His lungs appeared cta b/l on my exam. Normal rate and effort. On 2L Abd: Soft, mild distension, +bs. Some tenderness on the lower left portion of his abdomen today, mainly near his incision but was otherwise NT. No peritonitis. Colostomy takedown site clean. Superior aspect of midline wound with very small area of skin separation (<1 cm) at the most superior aspect but otherwise is c/d/I. Inferior aspect open with vicryl mesh at the base and is stable from the picture yesterday. There is no mepitel at the base of the wound. I have asked RN to locate this so it can be applied. Ext:  No LE edema Psych: A&Ox3  Skin: no rashes noted, warm and dry  Lab Results:  Recent Labs    03/24/22 0457 03/25/22 0351  WBC 13.8* 12.9*  HGB 7.1* 7.7*  HCT 22.6* 24.5*  PLT 347 457*   BMET Recent Labs    03/24/22 0457 03/25/22 0351  NA 130* 128*  K 4.9 5.1  CL 100 99  CO2 23 22  GLUCOSE 179* 113*  BUN 14 15  CREATININE 0.62 0.67   CALCIUM 8.1* 8.2*   PT/INR No results for input(s): "LABPROT", "INR" in the last 72 hours. CMP     Component Value Date/Time   NA 128 (L) 03/25/2022 0351   K 5.1 03/25/2022 0351   CL 99 03/25/2022 0351   CO2 22 03/25/2022 0351   GLUCOSE 113 (H) 03/25/2022 0351   BUN 15 03/25/2022 0351   CREATININE 0.67 03/25/2022 0351   CREATININE 1.18 02/20/2022 1117   CALCIUM 8.2 (L) 03/25/2022 0351   PROT 6.2 (L) 03/23/2022 0346   ALBUMIN 2.1 (L) 03/23/2022 0346   AST 28 03/23/2022 0346   AST 18 02/20/2022 1117   ALT 32 03/23/2022 0346   ALT 12 02/20/2022 1117   ALKPHOS 251 (H) 03/23/2022 0346   BILITOT 0.9 03/23/2022 0346   BILITOT 0.6 02/20/2022 1117   GFRNONAA >60 03/25/2022 0351   GFRNONAA >60 02/20/2022 1117   Lipase  No results found for: "LIPASE"  Studies/Results: No results found.  Anti-infectives: Anti-infectives (From admission, onward)    Start     Dose/Rate Route Frequency Ordered Stop   03/23/22 1330  piperacillin-tazobactam (ZOSYN) IVPB 3.375 g        3.375 g 12.5 mL/hr over 240 Minutes Intravenous  Once 03/23/22 1239 03/23/22 1715   03/13/22 0645  cefoTEtan (CEFOTAN) 2 g in sodium chloride 0.9 % 100 mL IVPB  2 g 200 mL/hr over 30 Minutes Intravenous On call to O.R. 03/13/22 1771 03/13/22 0926        Assessment/Plan POD 12 s/p ex lap, intraoperative ultrasound of the liver, cholecystectomy, hepatic wedge resection x2 and ileostomy reversal by Dr. [name] on 6/2 for hx of stage IV colon cancer with liver metastases s/p R hemicolectomy and chemo POD 2 s/p exploratory laparotomy with insertion of vicryl mesh for abdominal dehiscence by Dr. [name] on 03/23/22 - Please see Dr. [name] op note from 6/12. Unable to perform formal closure safely; a piece of Vicryl mesh was sewed to the fascial edges circumferentially as a barrier. Discussed w/ WOCN yesterday about what would be the most protective dressing moving forward - Mepitel at base + TID wtd + abdominal binder.   - Cont TPN. Allow CLD - Surg path showed no malignancy in hepatic wedge resections, indicating necrotic tumor per Dr. Ayesha Rumpf prior progress notes - Ambulate, pulmonary toilet, IS   FEN - Adv to FLD, TPN VTE - SCDs, Cont Lovenox ID - Cefotetan periop. None currently. Afebrile. WBC 12.9. Tachycardic, no hypotension. Foley - Out, voiding.    ABL anemia - hgb uptrending at 7.7 but tachycardic this am (no hypotension). Will see if symptomatic w/ therapies when oob. Check anemia panel and repeat hgb in AM.  Tachycardia - Could be multifactorial (anemia, pain, etc). Monitor. Add cardiac monitor. Hyponatremia - 128 today. On TPN. Will reach out to pharmacy to discuss what formula he is on currently (total Na received and volume of fluid). He appears euvolemic at this time and glucose between 100-200. No diuretics. Did recently have surgery. Will get serum and urine osm. Repeat labs in am.  COPD: On O2. Wean as able. Continue home Breo ellipta, prn albuterol. Aggressive pulmonary toilet, IS/add flutter valve. CTA 6/8 w/o evidence of PE R/L pleural effusions: Noted on CTA 6/8. Monitor. Wean o2 as able. Pulm toilet.     LOS: 12 days    Jillyn Ledger , [facility] 03/25/2022, 9:46 AM Please see Amion for pager number during day hours 7:00am-4:30pm

## 2022-03-25 NOTE — Progress Notes (Signed)
   03/25/22 0441  Assess: MEWS Score  Temp 98.1 F (36.7 C)  BP (!) 152/98  MAP (mmHg) 111  Pulse Rate (!) 110  Resp (!) 24  SpO2 98 %  O2 Device Room Air  Assess: MEWS Score  MEWS Temp 0  MEWS Systolic 0  MEWS Pulse 1  MEWS RR 1  MEWS LOC 0  MEWS Score 2  MEWS Score Color Yellow  Assess: if the MEWS score is Yellow or Red  Were vital signs taken at a resting state? Yes  Focused Assessment Change from prior assessment (see assessment flowsheet)  Does the patient meet 2 or more of the SIRS criteria? No  MEWS guidelines implemented *See Row Information* No, other (Comment)  Treat  MEWS Interventions Administered prn meds/treatments  Pain Scale 0-10  Pain Score 8  Faces Pain Scale 0  Pain Type Surgical pain  Pain Location Abdomen  Pain Orientation Mid  Pain Descriptors / Indicators Aching  Pain Frequency Intermittent  Pain Onset On-going  Pain Intervention(s) Medication (See eMAR)  Multiple Pain Sites No  Take Vital Signs  Increase Vital Sign Frequency  Yellow: Q 2hr X 2 then Q 4hr X 2, if remains yellow, continue Q 4hrs  Notify: Charge Nurse/RN  Name of Charge Nurse/RN Notified [email or web address] [email or web address]  Date Charge Nurse/RN Notified 03/25/22  Time Charge Nurse/RN Notified 0506  Document  Patient Outcome Stabilized after interventions  Progress note created (see row info) Yes  Assess: SIRS CRITERIA  SIRS Temperature  0  SIRS Pulse 1  SIRS Respirations  1  SIRS WBC 0  SIRS Score Sum  2

## 2022-03-25 NOTE — Evaluation (Signed)
Physical Therapy Evaluation Patient Details Name: Eric [name] MRN: [id number] DOB: 08-Jan-1971 Today's Date: 03/25/2022  History of Present Illness  Pt is 51 yr old M admitted on 03/13/22 for planned ileostomy reversal, wedge resection of liver x 2 and cholecystectomy.  Had second explratory lap 03/23/22 due to wound dehiscence. Vicryl mesh application. PMH: Colon CA, COPD  Clinical Impression  Patient received in bed, agrees to PT assessment. Reports he needs to go to the bathroom, therefore is slightly impulsive with mobility. Requires cues and min guard for safety with sit to stand and ambulating to bathroom and back. Patient very deconditioned and HR up to 130s with minimal activity. He will continue to benefit from skilled PT while here to improve strength, safety and activity tolerance.           Recommendations for follow up therapy are one component of a multi-disciplinary discharge planning process, led by the attending physician.  Recommendations may be updated based on patient status, additional functional criteria and insurance authorization.  Follow Up Recommendations Home health PT    Assistance Recommended at Discharge    Patient can return home with the following  Help with stairs or ramp for entrance;Assist for transportation;A little help with walking and/or transfers;A little help with bathing/dressing/bathroom;Assistance with cooking/housework    Equipment Recommendations None recommended by PT  Recommendations for Other Services       Functional Status Assessment Patient has had a recent decline in their functional status and demonstrates the ability to make significant improvements in function in a reasonable and predictable amount of time.     Precautions / Restrictions Precautions Precautions: Fall Restrictions Weight Bearing Restrictions: No      Mobility  Bed Mobility Overal bed mobility: Modified Independent                  Transfers Overall  transfer level: Modified independent Equipment used: Rolling walker (2 wheels)               General transfer comment: performs sit to stand impulsively, no assist needed    Ambulation/Gait Ambulation/Gait assistance: Min guard Gait Distance (Feet): 25 Feet Assistive device: Rolling walker (2 wheels) Gait Pattern/deviations: Step-through pattern Gait velocity: WNL     General Gait Details: patient ambulated into bathroom on room air. Reports sob, although O2 sats only down to 91%. HR up to 130s with minimal activity. HR up to 120s with supine bed exercises.  Stairs            Wheelchair Mobility    Modified Rankin (Stroke Patients Only)       Balance Overall balance assessment: Needs assistance Sitting-balance support: Feet supported Sitting balance-Leahy Scale: Good     Standing balance support: Bilateral upper extremity supported, During functional activity, Reliant on assistive device for balance Standing balance-Leahy Scale: Fair Standing balance comment: impusive, decreased safety awareness                             Pertinent Vitals/Pain Pain Assessment Pain Assessment: Faces Faces Pain Scale: Hurts a little bit Pain Location: Abdomen Pain Descriptors / Indicators: Tender, Discomfort Pain Intervention(s): Monitored during session    Home Living Family/patient expects to be discharged to:: Private residence Living Arrangements: Other relatives Available Help at Discharge: Family;Available 24 hours/day Type of Home: House Home Access: Level entry       Home Layout: One level Home Equipment: Rollator (4 wheels);Cane - single point  Additional Comments: patient's brother is coming to stay with him and sister is neaby to assist.    Prior Function Prior Level of Function : Independent/Modified Independent             Mobility Comments: Does not use AD in house, uses 4WW or SPC outside of home ADLs Comments: was independent      Hand Dominance   Dominant Hand: Right    Extremity/Trunk Assessment   Upper Extremity Assessment Upper Extremity Assessment: Generalized weakness    Lower Extremity Assessment Lower Extremity Assessment: Generalized weakness    Cervical / Trunk Assessment Cervical / Trunk Assessment: Normal  Communication   Communication: No difficulties  Cognition Arousal/Alertness: Awake/alert Behavior During Therapy: Impulsive Overall Cognitive Status: Within Functional Limits for tasks assessed                                 General Comments: patient resting in bed, agrees to PT states he needs to go to the bathroom, therefore slightly impulsive.        General Comments      Exercises Other Exercises Other Exercises: Supine B LE: ap, heel slides, hip abd/add, slr x ~5 reps. HR up to 120s with these.   Assessment/Plan    PT Assessment Patient needs continued PT services  PT Problem List Decreased strength;Decreased mobility;Decreased safety awareness;Decreased activity tolerance;Decreased balance;Pain;Decreased knowledge of precautions       PT Treatment Interventions DME instruction;Therapeutic exercise;Gait training;Balance training;Stair training;Functional mobility training;Therapeutic activities;Patient/family education    PT Goals (Current goals can be found in the Care Plan section)  Acute Rehab PT Goals Patient Stated Goal: to return home with brother PT Goal Formulation: With patient Time For Goal Achievement: 04/01/22 Potential to Achieve Goals: Good    Frequency Min 3X/week     Co-evaluation               AM-PAC PT "6 Clicks" Mobility  Outcome Measure Help needed turning from your back to your side while in a flat bed without using bedrails?: A Little Help needed moving from lying on your back to sitting on the side of a flat bed without using bedrails?: A Little Help needed moving to and from a bed to a chair (including a  wheelchair)?: A Little Help needed standing up from a chair using your arms (e.g., wheelchair or bedside chair)?: A Little Help needed to walk in hospital room?: A Little Help needed climbing 3-5 steps with a railing? : A Lot 6 Click Score: 17    End of Session Equipment Utilized During Treatment: Oxygen Activity Tolerance: Patient limited by fatigue Patient left: in bed;with call bell/phone within reach Nurse Communication: Mobility status PT Visit Diagnosis: Other abnormalities of gait and mobility (R26.89);Muscle weakness (generalized) (M62.81);Difficulty in walking, not elsewhere classified (R26.2);Pain Pain - part of body:  (abdomen)    Time: 1610-9604 PT Time Calculation (min) (ACUTE ONLY): 13 min   Charges:   PT Evaluation $PT Re-evaluation: 1 Re-eval          Shenita Trego, PT, GCS 03/25/22,2:35 PM

## 2022-03-26 ENCOUNTER — Inpatient Hospital Stay (HOSPITAL_COMMUNITY): Payer: 59

## 2022-03-26 LAB — URINALYSIS, COMPLETE (UACMP) WITH MICROSCOPIC
Bacteria, UA: NONE SEEN
Bilirubin Urine: NEGATIVE
Glucose, UA: NEGATIVE mg/dL
Hgb urine dipstick: NEGATIVE
Ketones, ur: NEGATIVE mg/dL
Leukocytes,Ua: NEGATIVE
Nitrite: NEGATIVE
Protein, ur: NEGATIVE mg/dL
Specific Gravity, Urine: 1.023 (ref 1.005–1.030)
pH: 5 (ref 5.0–8.0)

## 2022-03-26 LAB — COMPREHENSIVE METABOLIC PANEL
ALT: 46 U/L — ABNORMAL HIGH (ref 0–44)
AST: 40 U/L (ref 15–41)
Albumin: 2.3 g/dL — ABNORMAL LOW (ref 3.5–5.0)
Alkaline Phosphatase: 285 U/L — ABNORMAL HIGH (ref 38–126)
Anion gap: 11 (ref 5–15)
BUN: 19 mg/dL (ref 6–20)
CO2: 22 mmol/L (ref 22–32)
Calcium: 8.2 mg/dL — ABNORMAL LOW (ref 8.9–10.3)
Chloride: 96 mmol/L — ABNORMAL LOW (ref 98–111)
Creatinine, Ser: 0.7 mg/dL (ref 0.61–1.24)
GFR, Estimated: >60 mL/min (ref >60)
Glucose, Bld: 129 mg/dL — ABNORMAL HIGH (ref 70–99)
Potassium: 4.8 mmol/L (ref 3.5–5.1)
Sodium: 129 mmol/L — ABNORMAL LOW (ref 135–145)
Total Bilirubin: 0.7 mg/dL (ref 0.3–1.2)
Total Protein: 6.9 g/dL (ref 6.5–8.1)

## 2022-03-26 LAB — GLUCOSE, CAPILLARY
Glucose-Capillary: 133 mg/dL — ABNORMAL HIGH (ref 70–99)
Glucose-Capillary: 144 mg/dL — ABNORMAL HIGH (ref 70–99)
Glucose-Capillary: 146 mg/dL — ABNORMAL HIGH (ref 70–99)
Glucose-Capillary: 165 mg/dL — ABNORMAL HIGH (ref 70–99)

## 2022-03-26 LAB — TRIGLYCERIDES: Triglycerides: 90 mg/dL (ref <150)

## 2022-03-26 LAB — RETICULOCYTES
Immature Retic Fract: 27.6 % — ABNORMAL HIGH (ref 2.3–15.9)
RBC.: 2.74 MIL/uL — ABNORMAL LOW (ref 4.22–5.81)
Retic Count, Absolute: 92.3 10*3/uL (ref 19.0–186.0)
Retic Ct Pct: 3.4 % — ABNORMAL HIGH (ref 0.4–3.1)

## 2022-03-26 LAB — CBC
HCT: 24.8 % — ABNORMAL LOW (ref 39.0–52.0)
Hemoglobin: 7.9 g/dL — ABNORMAL LOW (ref 13.0–17.0)
MCH: 28.7 pg (ref 26.0–34.0)
MCHC: 31.9 g/dL (ref 30.0–36.0)
MCV: 90.2 fL (ref 80.0–100.0)
Platelets: 524 10*3/uL — ABNORMAL HIGH (ref 150–400)
RBC: 2.75 MIL/uL — ABNORMAL LOW (ref 4.22–5.81)
RDW: 16.6 % — ABNORMAL HIGH (ref 11.5–15.5)
WBC: 15 10*3/uL — ABNORMAL HIGH (ref 4.0–10.5)
nRBC: 0 % (ref 0.0–0.2)

## 2022-03-26 LAB — IRON AND TIBC
Iron: 11 ug/dL — ABNORMAL LOW (ref 45–182)
Saturation Ratios: 3 % — ABNORMAL LOW (ref 17.9–39.5)
TIBC: 343 ug/dL (ref 250–450)
UIBC: 332 ug/dL

## 2022-03-26 LAB — FERRITIN: Ferritin: 122 ng/mL (ref 24–336)

## 2022-03-26 LAB — PHOSPHORUS: Phosphorus: 4 mg/dL (ref 2.5–4.6)

## 2022-03-26 LAB — FOLATE: Folate: 13.6 ng/mL (ref >5.9)

## 2022-03-26 LAB — MAGNESIUM: Magnesium: 2.2 mg/dL (ref 1.7–2.4)

## 2022-03-26 LAB — VITAMIN B12: Vitamin B-12: 300 pg/mL (ref 180–914)

## 2022-03-26 MED ORDER — TRAVASOL 10 % IV SOLN
INTRAVENOUS | Status: AC
  Filled 2022-03-26: qty 1296

## 2022-03-26 MED ORDER — POLYETHYLENE GLYCOL 3350 17 G PO PACK
17.0000 g | PACK | Freq: Every day | ORAL | Status: DC
  Administered 2022-03-26 – 2022-03-29 (×4): 17 g via ORAL
  Filled 2022-03-26 (×4): qty 1

## 2022-03-26 MED ORDER — LABETALOL HCL 5 MG/ML IV SOLN
20.0000 mg | INTRAVENOUS | Status: DC | PRN
  Administered 2022-03-26 – 2022-03-27 (×3): 20 mg via INTRAVENOUS
  Filled 2022-03-26 (×3): qty 4

## 2022-03-26 MED ORDER — ACETAMINOPHEN 500 MG PO TABS
1000.0000 mg | ORAL_TABLET | Freq: Three times a day (TID) | ORAL | Status: DC | PRN
  Administered 2022-03-26: 1000 mg via ORAL
  Filled 2022-03-26: qty 2

## 2022-03-26 MED ORDER — ACETAMINOPHEN 10 MG/ML IV SOLN
1000.0000 mg | Freq: Four times a day (QID) | INTRAVENOUS | Status: DC
Start: 2022-03-27 — End: 2022-03-26

## 2022-03-26 MED ORDER — OXYCODONE HCL 5 MG PO TABS
10.0000 mg | ORAL_TABLET | ORAL | Status: DC | PRN
  Administered 2022-03-26 – 2022-03-31 (×16): 15 mg via ORAL
  Filled 2022-03-26 (×6): qty 3
  Filled 2022-03-26: qty 2
  Filled 2022-03-26 (×10): qty 3

## 2022-03-26 MED ORDER — FERROUS SULFATE 325 (65 FE) MG PO TABS: 325.0000 mg | ORAL_TABLET | Freq: Two times a day (BID) | ORAL | Status: DC

## 2022-03-26 MED ORDER — ACETAMINOPHEN 10 MG/ML IV SOLN: 1000.0000 mg | Freq: Four times a day (QID) | INTRAVENOUS | Status: DC

## 2022-03-26 MED ORDER — SODIUM CHLORIDE 0.9 % IV SOLN
250.0000 mg | Freq: Every day | INTRAVENOUS | Status: AC
  Administered 2022-03-26 – 2022-03-30 (×5): 250 mg via INTRAVENOUS
  Filled 2022-03-26 (×5): qty 20

## 2022-03-26 MED ORDER — LABETALOL HCL 5 MG/ML IV SOLN: 20.0000 mg | INTRAVENOUS | Status: DC | PRN

## 2022-03-26 MED ORDER — METOPROLOL TARTRATE 5 MG/5ML IV SOLN
5.0000 mg | Freq: Three times a day (TID) | INTRAVENOUS | Status: DC | PRN
  Administered 2022-03-26: 5 mg via INTRAVENOUS
  Filled 2022-03-26: qty 5

## 2022-03-26 MED ORDER — ACETAMINOPHEN 10 MG/ML IV SOLN
1000.0000 mg | Freq: Four times a day (QID) | INTRAVENOUS | Status: AC | PRN
  Administered 2022-03-26: 1000 mg via INTRAVENOUS
  Filled 2022-03-26: qty 100

## 2022-03-26 MED ORDER — ASCORBIC ACID 500 MG PO TABS: 500.0000 mg | ORAL_TABLET | Freq: Two times a day (BID) | ORAL | Status: DC

## 2022-03-26 NOTE — Progress Notes (Signed)
Mobility Specialist Progress Note:   03/26/22 0910  Mobility  Activity  (bed level exercises)  Range of Motion/Exercises Active;All extremities  Level of Assistance Independent  Assistive Device None  Activity Response Tolerated poorly  $Mobility charge 1 Mobility   Pt presenting with decreased activity tolerance this am. Declining OOB mobility d/t fatigue/not feeling well. Pt performed bed level exercises, however again limited by fatigue. Will f/u later today for possible OOB mobility. Left with all needs met.   Eric [name] Acute Rehab Secure Chat or Office Phone: [phone]

## 2022-03-26 NOTE — Progress Notes (Signed)
While changing old midline dressing ,NT came in to check patients vital sign. Noted elevated HR,RR and Temp. No apparent distress but did c/o pain medicated and placed ice packs . Left page for on call MD waiting for call back.Reached out to rapid response nurse, they came to assess found patient to have no apparent emergent need will continue to monitor.

## 2022-03-26 NOTE — Progress Notes (Signed)
3 Days Post-Op  Subjective: CC: T max 100.3 overnight. Tachy. BP okay Pain around lower abdominal wound, mainly to the R and feels like pressure. Taking in a good amount of FLD but will get nauseated afterwards. No emesis. Did drink one ensure yesterday. Passing flatus. No BM. No dysuria or hematuria but reports increased urinary frequency. Some sob at rest but increased sob when mobilizing with PT yesterday. Cough that is mainly non-productive but did cough up some white phlegm yesterday  Objective: Vital signs in last 24 hours: Temp:  [98.3 F (36.8 C)-100.3 F (37.9 C)] 98.6 F (37 C) (06/15 0757) Pulse Rate:  [101-125] 117 (06/15 0757) Resp:  [18-28] 21 (06/15 0757) BP: (108-133)/(76-88) 114/78 (06/15 0757) SpO2:  [91 %-100 %] 98 % (06/15 0757) Last BM Date : 03/22/22  Intake/Output from previous day: 06/14 0701 - 06/15 0700 In: 290 [P.O.:290] Out: 1600 [Urine:1600] Intake/Output this shift: No intake/output data recorded.  PE: Gen:  Alert, NAD, pleasant Card:  Tachycardic in the 110s. Sinus tach on monitor Pulm:  His lungs appeared cta b/l on my exam. Normal rate and effort. On 2L Abd: Soft, mild distension, +bs. Some tenderness on the lower portions of his abdomen today, mainly near his incision but was otherwise NT. No peritonitis. Colostomy takedown site clean. Superior aspect of midline wound with very small area of skin separation (<1 cm) at the most superior aspect but otherwise is c/d/I. Inferior aspect of wound dressing removed with scant serous like drainage on dressing. Wound as noted below w/ vicryl mesh at the base as seen in picture below with sutures that appear intact. Mepitel and wtd replaced. Ext:  No LE edema Psych: A&Ox3  Skin: no rashes noted, warm and dry    Lab Results:  Recent Labs    03/25/22 0351 03/26/22 0426  WBC 12.9* 15.0*  HGB 7.7* 7.9*  HCT 24.5* 24.8*  PLT 457* 524*   BMET Recent Labs    03/25/22 1243 03/26/22 0426  NA 131*  129*  K 4.7 4.8  CL 100 96*  CO2 23 22  GLUCOSE 125* 129*  BUN 16 19  CREATININE 0.65 0.70  CALCIUM 8.3* 8.2*   PT/INR No results for input(s): "LABPROT", "INR" in the last 72 hours. CMP     Component Value Date/Time   NA 129 (L) 03/26/2022 0426   K 4.8 03/26/2022 0426   CL 96 (L) 03/26/2022 0426   CO2 22 03/26/2022 0426   GLUCOSE 129 (H) 03/26/2022 0426   BUN 19 03/26/2022 0426   CREATININE 0.70 03/26/2022 0426   CREATININE 1.18 02/20/2022 1117   CALCIUM 8.2 (L) 03/26/2022 0426   PROT 6.9 03/26/2022 0426   ALBUMIN 2.3 (L) 03/26/2022 0426   AST 40 03/26/2022 0426   AST 18 02/20/2022 1117   ALT 46 (H) 03/26/2022 0426   ALT 12 02/20/2022 1117   ALKPHOS 285 (H) 03/26/2022 0426   BILITOT 0.7 03/26/2022 0426   BILITOT 0.6 02/20/2022 1117   GFRNONAA >60 03/26/2022 0426   GFRNONAA >60 02/20/2022 1117   Lipase  No results found for: "LIPASE"  Studies/Results: No results found.  Anti-infectives: Anti-infectives (From admission, onward)    Start     Dose/Rate Route Frequency Ordered Stop   03/23/22 1330  piperacillin-tazobactam (ZOSYN) IVPB 3.375 g        3.375 g 12.5 mL/hr over 240 Minutes Intravenous  Once 03/23/22 1239 03/23/22 1715   03/13/22 0645  cefoTEtan (CEFOTAN) 2 g in sodium  chloride 0.9 % 100 mL IVPB        2 g 200 mL/hr over 30 Minutes Intravenous On call to O.R. 03/13/22 6222 03/13/22 0926        Assessment/Plan POD 13 s/p ex lap, intraoperative ultrasound of the liver, cholecystectomy, hepatic wedge resection x2 and ileostomy reversal by Dr. [name] on 6/2 for hx of stage IV colon cancer with liver metastases s/p R hemicolectomy and chemo POD 3 s/p exploratory laparotomy with insertion of vicryl mesh for abdominal dehiscence by Dr. [name] on 03/23/22 - Please see Dr. [name] op note from 6/12. Unable to perform formal closure safely; a piece of Vicryl mesh was sewed to the fascial edges circumferentially as a barrier. Discussed w/ WOCN yesterday about what  would be the most protective dressing moving forward - Mepitel at base + TID wtd + abdominal binder.  - Cont TPN. Keep on FLD + shakes - Surg path showed no malignancy in hepatic wedge resections, indicating necrotic tumor per Dr. Ayesha Rumpf prior progress notes - Ambulate, pulmonary toilet, IS   FEN - Keep on FLD, TPN, bowel regimen VTE - SCDs, Cont Lovenox ID - Cefotetan periop. None currently. WBC 15. T max 100.3. Tachycardic but BP okay. On 2L o2 - stable. Obtain CXR, resp cx, UA, UCx. Consider BCx and repeat CT A/P if vitals/labs do not improve and above workup negative. Foley - Out, voiding.    ABL anemia - hgb uptrending at 7.9. Appears to be a component of IDA. Will add BID WC iron and vitamin C. B12 okay.  Tachycardia - Could be multifactorial (infectious, anemia, pain, etc). Sinus tach on monitor. Good UOP w/ +6.2L since admit. Continue to monitor. Fever workup as above. PRN metoprolol. Adjust pain meds. Hyponatremia - 129 today. On TPN. Pharmacy slightly adjusting free water and Na in TPN to slowly correct this. He appears euvolemic at this time and glucose between 100-200. No diuretics. Did recently have surgery. Serum sodium low normal at 277. Asked RN to send urine osm to help further classify. Repeat labs in am.  COPD: On O2. Wean as able. Continue home Breo ellipta, prn albuterol. Aggressive pulmonary toilet, IS/add flutter valve. CTA 6/8 w/o evidence of PE. Add mucinex. R/L pleural effusions: Noted on CTA 6/8. Monitor. Wean o2 as able. Pulm toilet.   LOS: 13 days    Jillyn Ledger , [facility] 03/26/2022, 8:19 AM Please see Amion for pager number during day hours 7:00am-4:30pm

## 2022-03-26 NOTE — Progress Notes (Addendum)
Pt scored Red MEWS earlier this afternoon d/t HR sustaining >130. PRN IV Metop given per MAR. PA notified; no new orders. Pt reports persistent 8/10 abdominal pain and overall feeling of increased "weakness" and fatigue. He has been tolerating activity poorly and maintains SpO2 WDL on 2L [place].   Pt remains compliant with acapella flutter valve use. No productive cough noted throughout shift; ordered sputum sample unable to be collected. Specimen cup left at pt bedside this morning.  Pt remains alert at baseline, resting in bed with even and unlabored respiration. Sister at bedside.

## 2022-03-26 NOTE — Progress Notes (Signed)
PHARMACY - TOTAL PARENTERAL NUTRITION CONSULT NOTE  Indication: Prolonged ileus  Patient Measurements: Height: 6\' 3"  (190.5 cm) Weight: 77 kg (169 lb 12.1 oz) IBW/kg (Calculated) : 84.5 TPN AdjBW (KG): 69.4 Body mass index is 21.22 kg/m. Usual Weight: 165 lb  Assessment:  33 yom with colon cancer with metastases to the liver. He underwent exlap LoA, cholecystectomy, wedge resection of liver lesions x2, and end ileostomy takedown on 6/2. Post-op he was put on clear liquids and was advanced to soft diet on 6/5 but had emesis that night. He is having loose bowel movements and passing flatus but post-op ileus suspected. NG tube was placed 6/7 and output continues to be high. Pharmacy consulted to dose TPN.   Glucose / Insulin: no hx, A1c 5.8 - CBGs < 180   Used 4 units SSI used in last 24hr Electrolytes: low Na/CL 129/96, others WNL (K, Phos, Mag high normal) Renal: SCr < 1, BUN WNL Hepatic: LFTs / Tbili down WNL, Alk phos up 285, TG 208 >>> 90, albumin 2.3 Intake / Output; MIVF: UOP 0.9 ml/kg/hr, LBM on 6/11 GI Imaging: 6/7 AXR: post-op ileus 6/8 CT A/P: no abscess, possible adynamic ileus, no high-grade pSBO GI Surgeries / Procedures:  6/12 - OR for wound dehisence > ex lap with insertion of mesh  Central access: double lumen PICC on 03/20/22 TPN start date: 03/20/22  Nutritional Goals:  RD Estimated Needs Total Energy Estimated Needs: 2400-2600 kcal/d Total Protein Estimated Needs: 125-140 g/d Total Fluid Estimated Needs: 2.4-2.6 L/d  Current Nutrition:  Full liquids and TPN - c/o nausea Ensure Max daily  Plan:  Unconcentrate and reduce free water in TPN  TPN at 100 ml/hr will provide 130g AA and 2422 kCal, meeting 100% of needs Electrolytes in TPN: increase Na to 141mEq/L, decrease K to 14mEq/L, Ca 95mEq/L, reduce Mg slightly to 79mEq/L, decrease Phos 35mmol/L, Cl:Ac 1:2  Add standard MVI and trace elements to TPN Continue sensitive SSI Q6H Monitor standard TPN labs on  Mon/Thurs, BMET / Phos in AM F/U PO intake/diet advancement to begin weaning TPN F/u iron supplementation   Krista Godsil D. Mina Marble, PharmD, BCPS, [place] 03/26/2022, 9:07 AM

## 2022-03-27 ENCOUNTER — Inpatient Hospital Stay (HOSPITAL_COMMUNITY): Payer: 59

## 2022-03-27 LAB — BASIC METABOLIC PANEL
Anion gap: 11 (ref 5–15)
BUN: 27 mg/dL — ABNORMAL HIGH (ref 6–20)
CO2: 22 mmol/L (ref 22–32)
Calcium: 8.2 mg/dL — ABNORMAL LOW (ref 8.9–10.3)
Chloride: 99 mmol/L (ref 98–111)
Creatinine, Ser: 1 mg/dL (ref 0.61–1.24)
GFR, Estimated: >60 mL/min (ref >60)
Glucose, Bld: 157 mg/dL — ABNORMAL HIGH (ref 70–99)
Potassium: 4 mmol/L (ref 3.5–5.1)
Sodium: 132 mmol/L — ABNORMAL LOW (ref 135–145)

## 2022-03-27 LAB — GLUCOSE, CAPILLARY
Glucose-Capillary: 140 mg/dL — ABNORMAL HIGH (ref 70–99)
Glucose-Capillary: 177 mg/dL — ABNORMAL HIGH (ref 70–99)
Glucose-Capillary: 187 mg/dL — ABNORMAL HIGH (ref 70–99)
Glucose-Capillary: 198 mg/dL — ABNORMAL HIGH (ref 70–99)

## 2022-03-27 LAB — CBC
HCT: 21.9 % — ABNORMAL LOW (ref 39.0–52.0)
HCT: 22.7 % — ABNORMAL LOW (ref 39.0–52.0)
Hemoglobin: 7.1 g/dL — ABNORMAL LOW (ref 13.0–17.0)
Hemoglobin: 7.4 g/dL — ABNORMAL LOW (ref 13.0–17.0)
MCH: 28.6 pg (ref 26.0–34.0)
MCH: 29.6 pg (ref 26.0–34.0)
MCHC: 32.4 g/dL (ref 30.0–36.0)
MCHC: 32.6 g/dL (ref 30.0–36.0)
MCV: 88.3 fL (ref 80.0–100.0)
MCV: 90.8 fL (ref 80.0–100.0)
Platelets: 312 10*3/uL (ref 150–400)
Platelets: 255 10*3/uL (ref 150–400)
RBC: 2.48 MIL/uL — ABNORMAL LOW (ref 4.22–5.81)
RBC: 2.5 MIL/uL — ABNORMAL LOW (ref 4.22–5.81)
RDW: 16.4 % — ABNORMAL HIGH (ref 11.5–15.5)
RDW: 16.1 % — ABNORMAL HIGH (ref 11.5–15.5)
WBC: 12.9 10*3/uL — ABNORMAL HIGH (ref 4.0–10.5)
WBC: 9.2 10*3/uL (ref 4.0–10.5)
nRBC: 0 % (ref 0.0–0.2)
nRBC: 0 % (ref 0.0–0.2)

## 2022-03-27 LAB — PHOSPHORUS: Phosphorus: 2.7 mg/dL (ref 2.5–4.6)

## 2022-03-27 LAB — LACTIC ACID, PLASMA
Lactic Acid, Venous: 1.8 mmol/L (ref 0.5–1.9)
Lactic Acid, Venous: 1.6 mmol/L (ref 0.5–1.9)

## 2022-03-27 LAB — PREPARE RBC (CROSSMATCH)

## 2022-03-27 MED ORDER — IOHEXOL 300 MG/ML  SOLN
100.0000 mL | Freq: Once | INTRAMUSCULAR | Status: AC | PRN
  Administered 2022-03-27: 100 mL via INTRAVENOUS

## 2022-03-27 MED ORDER — SODIUM CHLORIDE 0.9% IV SOLUTION
Freq: Once | INTRAVENOUS | Status: AC
Start: 2022-03-27 — End: 2022-03-27

## 2022-03-27 MED ORDER — INSULIN ASPART 100 UNIT/ML IJ SOLN
0.0000 [IU] | Freq: Four times a day (QID) | INTRAMUSCULAR | Status: DC
  Administered 2022-03-27 (×2): 3 [IU] via SUBCUTANEOUS
  Administered 2022-03-27 – 2022-03-28 (×2): 2 [IU] via SUBCUTANEOUS
  Administered 2022-03-28: 3 [IU] via SUBCUTANEOUS
  Administered 2022-03-28: 2 [IU] via SUBCUTANEOUS
  Administered 2022-03-28: 3 [IU] via SUBCUTANEOUS
  Administered 2022-03-29: 2 [IU] via SUBCUTANEOUS

## 2022-03-27 MED ORDER — TRAVASOL 10 % IV SOLN
INTRAVENOUS | Status: AC
  Filled 2022-03-27: qty 1296

## 2022-03-27 MED ORDER — IOHEXOL 9 MG/ML PO SOLN
ORAL | Status: AC
  Administered 2022-03-27: 500 mL
  Filled 2022-03-27: qty 1000

## 2022-03-27 MED ORDER — ENSURE ENLIVE PO LIQD
237.0000 mL | Freq: Three times a day (TID) | ORAL | Status: DC
  Administered 2022-03-31 – 2022-04-04 (×11): 237 mL via ORAL

## 2022-03-27 NOTE — Progress Notes (Signed)
Reminded pt to cintinue drinking oral contrast. Pt has drank about 47m and is tolerating well.

## 2022-03-27 NOTE — Progress Notes (Signed)
4 Days Post-Op  Subjective: CC: Tmax 100.1 overnight. Tachy up to 140's requiring IV labetolol. Hypoxia when weaned to RA last night. On 2L this am. Diaphoresis noted. Some stable lower abdominal pain. Tolerating fld. Still with some nausea. No emesis. Passing flatus. Liquid bm this am. Voiding, no urinary symptoms. No CP. Some sob, more with activity. Lightheaded and dizzy when oob yesterday. No calf pain or LE swelling.   Objective: Vital signs in last 24 hours: Temp:  [98.2 F (36.8 C)-100.1 F (37.8 C)] 98.2 F (36.8 C) (06/16 0808) Pulse Rate:  [100-144] 111 (06/16 0808) Resp:  [16-24] 16 (06/16 0808) BP: (93-171)/(67-98) 103/69 (06/16 0808) SpO2:  [82 %-99 %] 96 % (06/16 0808) Last BM Date : 03/22/22  Intake/Output from previous day: 06/15 0701 - 06/16 0700 In: 1774.8 [P.O.:140; I.V.:830.7; IV Piggyback:804.1] Out: 600 [Urine:600] Intake/Output this shift: No intake/output data recorded.  PE: Gen:  Alert, pleasant Card:  Tachycardic in the 110s. Sinus tach on monitor Pulm:  His lungs appeared cta b/l on my exam. Normal rate and effort. On 2L. Does have a cough while I was in the room Abd: Soft, mild distension, +bs. Some tenderness on the lower portions of his abdomen today, mainly near his incision but was otherwise NT. No peritonitis. Colostomy takedown site clean. Superior aspect of midline wound with very small area of skin separation (<1 cm) at the most superior aspect but otherwise is c/d/I. Inferior aspect of wound dressing removed with some serous like drainage on dressing. Wound as noted below w/ vicryl mesh at the base as seen in picture below with sutures that appear intact. Mepitel and wtd replaced. Ext:  No LE edema or calf ttp. Neg Homan's sign. Psych: A&Ox3  Skin: diaphoretic     Lab Results:  Recent Labs    03/26/22 0426 03/27/22 0502  WBC 15.0* 12.9*  HGB 7.9* 7.1*  HCT 24.8* 21.9*  PLT 524* 312   BMET Recent Labs    03/26/22 0426  03/27/22 0502  NA 129* 132*  K 4.8 4.0  CL 96* 99  CO2 22 22  GLUCOSE 129* 157*  BUN 19 27*  CREATININE 0.70 1.00  CALCIUM 8.2* 8.2*   PT/INR No results for input(s): "LABPROT", "INR" in the last 72 hours. CMP     Component Value Date/Time   NA 132 (L) 03/27/2022 0502   K 4.0 03/27/2022 0502   CL 99 03/27/2022 0502   CO2 22 03/27/2022 0502   GLUCOSE 157 (H) 03/27/2022 0502   BUN 27 (H) 03/27/2022 0502   CREATININE 1.00 03/27/2022 0502   CREATININE 1.18 02/20/2022 1117   CALCIUM 8.2 (L) 03/27/2022 0502   PROT 6.9 03/26/2022 0426   ALBUMIN 2.3 (L) 03/26/2022 0426   AST 40 03/26/2022 0426   AST 18 02/20/2022 1117   ALT 46 (H) 03/26/2022 0426   ALT 12 02/20/2022 1117   ALKPHOS 285 (H) 03/26/2022 0426   BILITOT 0.7 03/26/2022 0426   BILITOT 0.6 02/20/2022 1117   GFRNONAA >60 03/27/2022 0502   GFRNONAA >60 02/20/2022 1117   Lipase  No results found for: "LIPASE"  Studies/Results: DG Abd 1 View  Result Date: 03/27/2022 CLINICAL DATA:  51 year old male with history of abdominal pain. History of colon cancer with metastatic disease. EXAM: ABDOMEN - 1 VIEW COMPARISON:  Abdominal radiograph 03/18/2022. FINDINGS: Previously noted nasogastric tube has been removed. There are multiple prominent but nondilated loops of small bowel in the central abdomen. Mildly dilated loop of  small bowel in the left upper quadrant measuring 3.2 cm in diameter. No pathologic dilatation of colon. Gas is noted throughout the colon and distal rectum. Surgical clips project over the right upper quadrant of the abdomen. Previously noted midline skin staples have been removed. IMPRESSION: 1. Nonspecific bowel-gas pattern, as above, favored to reflect a mild ileus. 2. No pneumoperitoneum. Electronically Signed   By: Vinnie Langton M.D.   On: 03/27/2022 06:07   DG CHEST PORT 1 VIEW  Result Date: 03/26/2022 CLINICAL DATA:  Chest pain, shortness of breath EXAM: PORTABLE CHEST 1 VIEW COMPARISON:  Previous  studies including the examination of 03/19/2022 FINDINGS: Cardiac size is within normal limits. There is interval decrease in interstitial markings in the parahilar regions. There is residual increased density in the right lower lung fields obscuring the right lateral costophrenic angle. Linear densities are seen in the medial left lower lung fields. Left lateral CP angle is clear. There is no pneumothorax. Tip of right chest port is seen in the superior vena cava. There is interval removal of enteric tube. There is interval placement of right PICC line with its tip in the region of superior vena cava. IMPRESSION: There is interval decrease in interstitial markings in the parahilar regions. Residual increased density in the right lower lung fields may suggest right pleural effusion and possibly underlying atelectasis/pneumonia. Small linear densities in the medial left lower lung fields suggest subsegmental atelectasis. Electronically Signed   By: Elmer Picker M.D.   On: 03/26/2022 10:08    Anti-infectives: Anti-infectives (From admission, onward)    Start     Dose/Rate Route Frequency Ordered Stop   03/23/22 1330  piperacillin-tazobactam (ZOSYN) IVPB 3.375 g        3.375 g 12.5 mL/hr over 240 Minutes Intravenous  Once 03/23/22 1239 03/23/22 1715   03/13/22 0645  cefoTEtan (CEFOTAN) 2 g in sodium chloride 0.9 % 100 mL IVPB        2 g 200 mL/hr over 30 Minutes Intravenous On call to O.R. 03/13/22 9924 03/13/22 0926        Assessment/Plan POD 14 s/p ex lap, intraoperative ultrasound of the liver, cholecystectomy, hepatic wedge resection x2 and ileostomy reversal by Dr. [name] on 6/2 for hx of stage IV colon cancer with liver metastases s/p R hemicolectomy and chemo POD 4 s/p exploratory laparotomy with insertion of vicryl mesh for abdominal dehiscence by Dr. [name] on 03/23/22 - Please see Dr. [name] op note from 6/12. Unable to perform formal closure safely; a piece of Vicryl mesh was sewed  to the fascial edges circumferentially as a barrier. Discussed w/ WOCN yesterday about what would be the most protective dressing moving forward - Mepitel at base + TID wtd + abdominal binder.  - Cont TPN. Keep on FLD + shakes - Surg path showed no malignancy in hepatic wedge resections, indicating necrotic tumor per Dr. Ayesha Rumpf prior progress notes - Ambulate (TID in halls + PT) - Pulmonary toilet, IS   FEN - Keep on FLD, shakes, TPN, bowel regimen VTE - SCDs, Lovenox ID - Cefotetan periop. None currently. WBC 15>12.9 . T max 100.1. Tachycardic. On 2L o2, hypoxia when tried to wean to RA. CXR w/ RLL atelectasis vs PNA. Discussed w/ MD - wait on resp cx before tx. Cont IS/FV. Will get BCx, CT A/P, LE [place] to further eval.  Foley - Out, voiding.    ABL anemia - hgb 7.1 from 7.9. Tachy and symptomatic with activtiy. 1U PRBC today. Recheck CBC  after. Appears to be a component of IDA - cont iron (discussed with pharm 6/15 best option).  Tachycardia - Could be multifactorial (infectious, anemia, pain, etc). Sinus tach on monitor. +7.45L since admit but uop down slightly at 0.47m/kg/hr. Cr also up from 0.7 to 1.0. If tachycardia persists after PRBC, will start some low dose mivf. Continue to monitor. Fever workup as above. Neg CTA 6/8. PRN IV BB. Pain meds changed/optimized 6/15.  Hyponatremia - On TPN. Improved to 132 today after pharmacy decreased free water and inc Na in TPN to slowly correct this. He appears euvolemic at this time and glucose between 100-200. No diuretics. Did recently have surgery. Serum osm low normal at 277. Urine osm to help further classify. Repeat labs in am.  COPD: On O2. Wean as able. Continue home Breo ellipta, prn albuterol. Aggressive pulmonary toilet, IS/add flutter valve. CTA 6/8 w/o evidence of PE. Cont mucinex. R/L pleural effusions: Noted on CTA 6/8. Monitor. Wean o2 as able. Pulm toilet.   LOS: 14 days    MJillyn Ledger, PNyu [facility] 03/27/2022,  8:29 AM Please see Amion for pager number during day hours 7:00am-4:30pm

## 2022-03-27 NOTE — Progress Notes (Signed)
Oral contrast delivered. Instructed pt on drinking oral contrast. Pt verbalizes understanding. Zofran given for prophylaxis.

## 2022-03-27 NOTE — Progress Notes (Signed)
Physical Therapy Treatment Patient Details Name: Eric [name] MRN: [id number] DOB: [birth year] Today's Date: 03/27/2022   History of Present Illness Pt is 51 yr old M admitted on 03/13/22 for planned ileostomy reversal, wedge resection of liver x 2 and cholecystectomy.  Had second explratory lap 03/23/22 due to wound dehiscence. Vicryl mesh application. PMH: Colon CA, COPD    PT Comments    Pt admitted with above diagnosis. Pt only able to perform exercises today. Pt getting blood and not feeling well per pt. Encouraged pt to get up with nurseing over weekend and to continue exercises 2-3 x day.  Pt currently with functional limitations due to balance and endurance deficits. Pt will benefit from skilled PT to increase their independence and safety with mobility to allow discharge to the venue listed below.      Recommendations for follow up therapy are one component of a multi-disciplinary discharge planning process, led by the attending physician.  Recommendations may be updated based on patient status, additional functional criteria and insurance authorization.  Follow Up Recommendations  Home health PT     Assistance Recommended at Discharge    Patient can return home with the following Help with stairs or ramp for entrance;Assist for transportation;A little help with walking and/or transfers;A little help with bathing/dressing/bathroom;Assistance with cooking/housework   Equipment Recommendations  None recommended by PT    Recommendations for Other Services       Precautions / Restrictions Precautions Precautions: Fall Restrictions Weight Bearing Restrictions: No     Mobility  Bed Mobility               General bed mobility comments: Pt states he is fatigued and doesnt want to get up.  Noted pt is receiving blood and is pale in color. He immediately starts showing PT his exercise program.    Transfers                        Ambulation/Gait                    Stairs             Wheelchair Mobility    Modified Rankin (Stroke Patients Only)       Balance                                            Cognition Arousal/Alertness: Awake/alert Behavior During Therapy: Flat affect Overall Cognitive Status: Within Functional Limits for tasks assessed                                          Exercises General Exercises - Upper Extremity Shoulder Flexion: AROM, Both, 10 reps, Supine Elbow Flexion: AROM, Both, 10 reps, Supine Elbow Extension: AROM, Both, 10 reps, Supine General Exercises - Lower Extremity Ankle Circles/Pumps: AROM, Both, 10 reps, Supine Quad Sets: AROM, Both, 10 reps, Supine Heel Slides: AROM, Both, 10 reps, Supine Hip ABduction/ADduction: AROM, Both, 10 reps, Supine Straight Leg Raises: AROM, Both, 10 reps, Supine    General Comments General comments (skin integrity, edema, etc.): Pt O2 off on arrival. Sats at rest 91% on RA.  Pt sats to 88% on RA with exercises but returns to >90% at rest.  Pertinent Vitals/Pain Pain Assessment Pain Assessment: Faces Faces Pain Scale: Hurts a little bit Pain Location: Abdomen Pain Descriptors / Indicators: Tender, Discomfort Pain Intervention(s): Limited activity within patient's tolerance, Monitored during session, Repositioned    Home Living                          Prior Function            PT Goals (current goals can now be found in the care plan section) Acute Rehab PT Goals Patient Stated Goal: to return home with brother Progress towards PT goals: Progressing toward goals    Frequency    Min 3X/week      PT Plan Current plan remains appropriate    Co-evaluation              AM-PAC PT "6 Clicks" Mobility   Outcome Measure  Help needed turning from your back to your side while in a flat bed without using bedrails?: A Little Help needed moving from lying on your back to sitting on the side  of a flat bed without using bedrails?: A Little Help needed moving to and from a bed to a chair (including a wheelchair)?: A Little Help needed standing up from a chair using your arms (e.g., wheelchair or bedside chair)?: A Little Help needed to walk in hospital room?: A Little Help needed climbing 3-5 steps with a railing? : A Lot 6 Click Score: 17    End of Session Equipment Utilized During Treatment: Oxygen Activity Tolerance: Patient limited by fatigue Patient left: in bed;with call bell/phone within reach;with bed alarm set Nurse Communication: Mobility status PT Visit Diagnosis: Other abnormalities of gait and mobility (R26.89);Muscle weakness (generalized) (M62.81);Difficulty in walking, not elsewhere classified (R26.2);Pain Pain - part of body:  (abdomen)     Time: 1962-2297 PT Time Calculation (min) (ACUTE ONLY): 9 min  Charges:  $Therapeutic Exercise: 8-22 mins                     [facility] M,PT Acute Rehab Services [phone]    Alvira Philips 03/27/2022, 3:02 PM

## 2022-03-27 NOTE — Progress Notes (Signed)
PHARMACY - TOTAL PARENTERAL NUTRITION CONSULT NOTE  Indication: Prolonged ileus  Patient Measurements: Height: _0  (190.5 cm) Weight: 77 kg (169 lb 12.1 oz) IBW/kg (Calculated) : 84.5 TPN AdjBW (KG): 69.4 Body mass index is 21.22 kg/m. Usual Weight: 165 lb  Assessment:  6 yom with colon cancer with metastases to the liver. He underwent exlap LoA, cholecystectomy, wedge resection of liver lesions x2, and end ileostomy takedown on 6/2. Post-op he was put on clear liquids and was advanced to soft diet on 6/5 but had emesis that night. He is having loose bowel movements and passing flatus but post-op ileus suspected. NG tube was placed 6/7 and output continues to be high. Pharmacy consulted to dose TPN.   Glucose / Insulin: no hx, A1c 5.8 - CBGs mildly elevated (?stress related) Used 5 units SSI used in last 24hr Electrolytes: Na up to 132, others WNL (Phos low normal after slight reduction) Renal: SCr < 1, BUN WNL Hepatic: LFTs / Tbili down WNL, Alk phos up 285, TG 208 >>> 90, albumin 2.3 Intake / Output; MIVF: UOP 0.3 ml/kg/hr, LBM on 6/11 GI Imaging: 6/7 AXR: post-op ileus 6/8 CT A/P: no abscess, possible adynamic ileus, no high-grade pSBO 6/16 AXR: mild elus GI Surgeries / Procedures:  6/12 - OR for wound dehisence > ex lap with insertion of mesh  Central access: double lumen PICC on 03/20/22 TPN start date: 03/20/22  Nutritional Goals:  RD Estimated Needs Total Energy Estimated Needs: 2400-2600 kcal/d Total Protein Estimated Needs: 125-140 g/d Total Fluid Estimated Needs: 2.4-2.6 L/d  Current Nutrition:  Full liquids and TPN - c/o nausea, hypoxia Ensure Max daily  Plan:  Continue TPN at 100 ml/hr to provide 130g AA and 2422 kCal, meeting 100% of needs Electrolytes in TPN: increase Na to 153mq/L and reduce free water in TPN on 6/15, increase K back to 482m/L, Ca 61m84mL, reduce Mg slightly to 4mE21m on 6/15, increase Phos slightly to 13mm26m, Cl:Ac 1:2  Add standard MVI  and trace elements to TPN Increase SSI to moderate Q6H Monitor standard TPN labs on Mon/Thurs, consider repeating labs on 6/18 F/U PO intake/diet advancement   Eric [name] D. Eric [name],Eric MarblermD, BCPS, BCCCPL[place]/2023, 11:13 AM

## 2022-03-27 NOTE — Progress Notes (Signed)
Nutrition Follow-up  DOCUMENTATION CODES:  Severe malnutrition in context of chronic illness  INTERVENTION:  Continue current diet as ordered, encourage PO intake Change Ensure Max to Ensure Enlive po TID, each supplement provides 350 kcal and 20 grams of protein. Will add whole milk and grape juice to each dining tray Recommend increasing kcal and protein in TPN as pt requires additional nutrition for wound healing. Would not recommend reducing rate and begin weaning from goal until pt is meeting 50% of needs orally and would not recommend discontinuing fully until pt is meeting >85% of needs orally.  NUTRITION DIAGNOSIS:  Severe Malnutrition related to chronic illness (metastatic colon cancer) as evidenced by severe fat depletion, severe muscle depletion. - remains applicable  GOAL:  Patient will meet greater than or equal to 90% of their needs - being addressed with TPN  MONITOR:  Diet advancement, Labs, Weight trends, I & O's  REASON FOR ASSESSMENT:  Consult New TPN/TNA  ASSESSMENT:  Pt with COPD and metastatic colon cancer, admitted for open ileostomy reversal and open hepatic wedge resection x2.  06/02: s/p ileostomy reversal, hepatic wedge resection 06/06: developed post-op ileus; NGT placed 06/09: PICC line placed, TPN initiated 6/11 - NGT removed, clears initiated  6/12 - Op, EXPLORATORY LAPAROTOMY INSERTION OF VICRYL MESH 6/14 - Full Liquids   Pt resting in bed at the time of assessment. Family at bedside. Talked with pt about his intake over the last few days. Pt reports that he has been working on drinking more, but is trying not to overdo it. States that today he has only had sprite and water. Pt states yesterday he had some jello, an ensure, and some clear liquids.   Talked with pt about the importance of nutrition in healing his wound. Did discuss trying to increase his oral intake so that we could move towards reducing his TPN rate. Pt in agreement with trying  to eat a little at all meals and will focus on drinking nutrient dense drinks like milk and juice. Will also increase nutrition supplements to TID and adjust to ensure enlive to provide more kcal. Pt in agreement with plan. Discussed with surgery and pharmacy.  Average Meal Intake: 6/2-6/12: 30% intake x 2 recorded meals  Nutritionally Relevant Medications: Scheduled Meds:  insulin aspart  0-9 Units Subcutaneous Q6H   pantoprazole IV  40 mg Intravenous Daily   polyethylene glycol  17 g Oral Daily   Ensure Max Protein  11 oz Oral Daily   Continuous Infusions:  ferric gluconate (FERRLECIT) IVPB 250 mg (03/27/22 0829)   TPN ADULT (ION) 100 mL/hr at 03/27/22 0213   PRN Meds: diphenhydrAMINE, ondansetron   Labs Reviewed: Sodium 132 CBG ranges from 133-198 mg/dL over the last 24 hours HgbA1c 5.8% Triglycerides 90 (6/15)  NUTRITION - FOCUSED PHYSICAL EXAM: Flowsheet Row Most Recent Value  Orbital Region Severe depletion  Upper Arm Region Severe depletion  Thoracic and Lumbar Region Moderate depletion  Buccal Region Severe depletion  Temple Region Severe depletion  Clavicle Bone Region Severe depletion  Clavicle and Acromion Bone Region Severe depletion  Scapular Bone Region Severe depletion  Dorsal Hand Severe depletion  Patellar Region Severe depletion  Anterior Thigh Region Severe depletion  Posterior Calf Region Moderate depletion  Edema (RD Assessment) None  Hair Reviewed  Eyes Reviewed  Mouth Other (Comment)  [poor dentition, wears dentures]  Skin Reviewed  Nails Other (Comment)  [yellow]    Diet Order:   Diet Order  Diet full liquid Room service appropriate? Yes; Fluid consistency: Thin  Diet effective now                   EDUCATION NEEDS:  Education needs have been addressed  Skin:  Skin Assessment: Skin Integrity Issues: Skin Integrity Issues:: Incisions Incisions: abdomen (closed)  Last BM:  6/11 - type 7  Height:  Ht Readings from  Last 1 Encounters:  03/13/22 '6\' 3"'$  (1.905 m)   Weight:  Wt Readings from Last 1 Encounters:  03/24/22 77 kg   BMI:  Body mass index is 21.22 kg/m.  Estimated Nutritional Needs:  Kcal:  2400-2600 kcal/d Protein:  125-140 g/d Fluid:  2.4-2.6 L/d   Ranell Patrick, RD, LDN Clinical Dietitian RD pager # available in [place]  After hours/weekend pager # available in [facility]

## 2022-03-28 ENCOUNTER — Inpatient Hospital Stay (HOSPITAL_COMMUNITY): Payer: 59

## 2022-03-28 DIAGNOSIS — R509 Fever, unspecified: Secondary | ICD-10-CM | POA: Diagnosis not present

## 2022-03-28 LAB — BLOOD CULTURE ID PANEL (REFLEXED) - BCID2

## 2022-03-28 LAB — TYPE AND SCREEN
ABO/RH(D): A POS
Antibody Screen: NEGATIVE
Unit division: 0

## 2022-03-28 LAB — CBC
HCT: 23.7 % — ABNORMAL LOW (ref 39.0–52.0)
Hemoglobin: 7.4 g/dL — ABNORMAL LOW (ref 13.0–17.0)
MCH: 28.6 pg (ref 26.0–34.0)
MCHC: 31.2 g/dL (ref 30.0–36.0)
MCV: 91.5 fL (ref 80.0–100.0)
Platelets: 271 10*3/uL (ref 150–400)
RBC: 2.59 MIL/uL — ABNORMAL LOW (ref 4.22–5.81)
RDW: 16.3 % — ABNORMAL HIGH (ref 11.5–15.5)
WBC: 8.6 10*3/uL (ref 4.0–10.5)
nRBC: 0 % (ref 0.0–0.2)

## 2022-03-28 LAB — GLUCOSE, CAPILLARY
Glucose-Capillary: 139 mg/dL — ABNORMAL HIGH (ref 70–99)
Glucose-Capillary: 140 mg/dL — ABNORMAL HIGH (ref 70–99)
Glucose-Capillary: 153 mg/dL — ABNORMAL HIGH (ref 70–99)
Glucose-Capillary: 159 mg/dL — ABNORMAL HIGH (ref 70–99)

## 2022-03-28 LAB — BPAM RBC
Blood Product Expiration Date: 202307062359
ISSUE DATE / TIME: 202306161306
Unit Type and Rh: 6200

## 2022-03-28 LAB — BASIC METABOLIC PANEL
Anion gap: 8 (ref 5–15)
BUN: 19 mg/dL (ref 6–20)
CO2: 23 mmol/L (ref 22–32)
Calcium: 8.1 mg/dL — ABNORMAL LOW (ref 8.9–10.3)
Chloride: 103 mmol/L (ref 98–111)
Creatinine, Ser: 0.7 mg/dL (ref 0.61–1.24)
GFR, Estimated: >60 mL/min (ref >60)
Glucose, Bld: 144 mg/dL — ABNORMAL HIGH (ref 70–99)
Potassium: 4.8 mmol/L (ref 3.5–5.1)
Sodium: 134 mmol/L — ABNORMAL LOW (ref 135–145)

## 2022-03-28 IMAGING — DX DG CHEST 1V PORT
1 series · 1 of 1 positions shown · non-contrast
Comparison: 11/15/2020

CLINICAL DATA: Port-A-Cath placement

EXAM:
PORTABLE CHEST 1 VIEW

[chest]
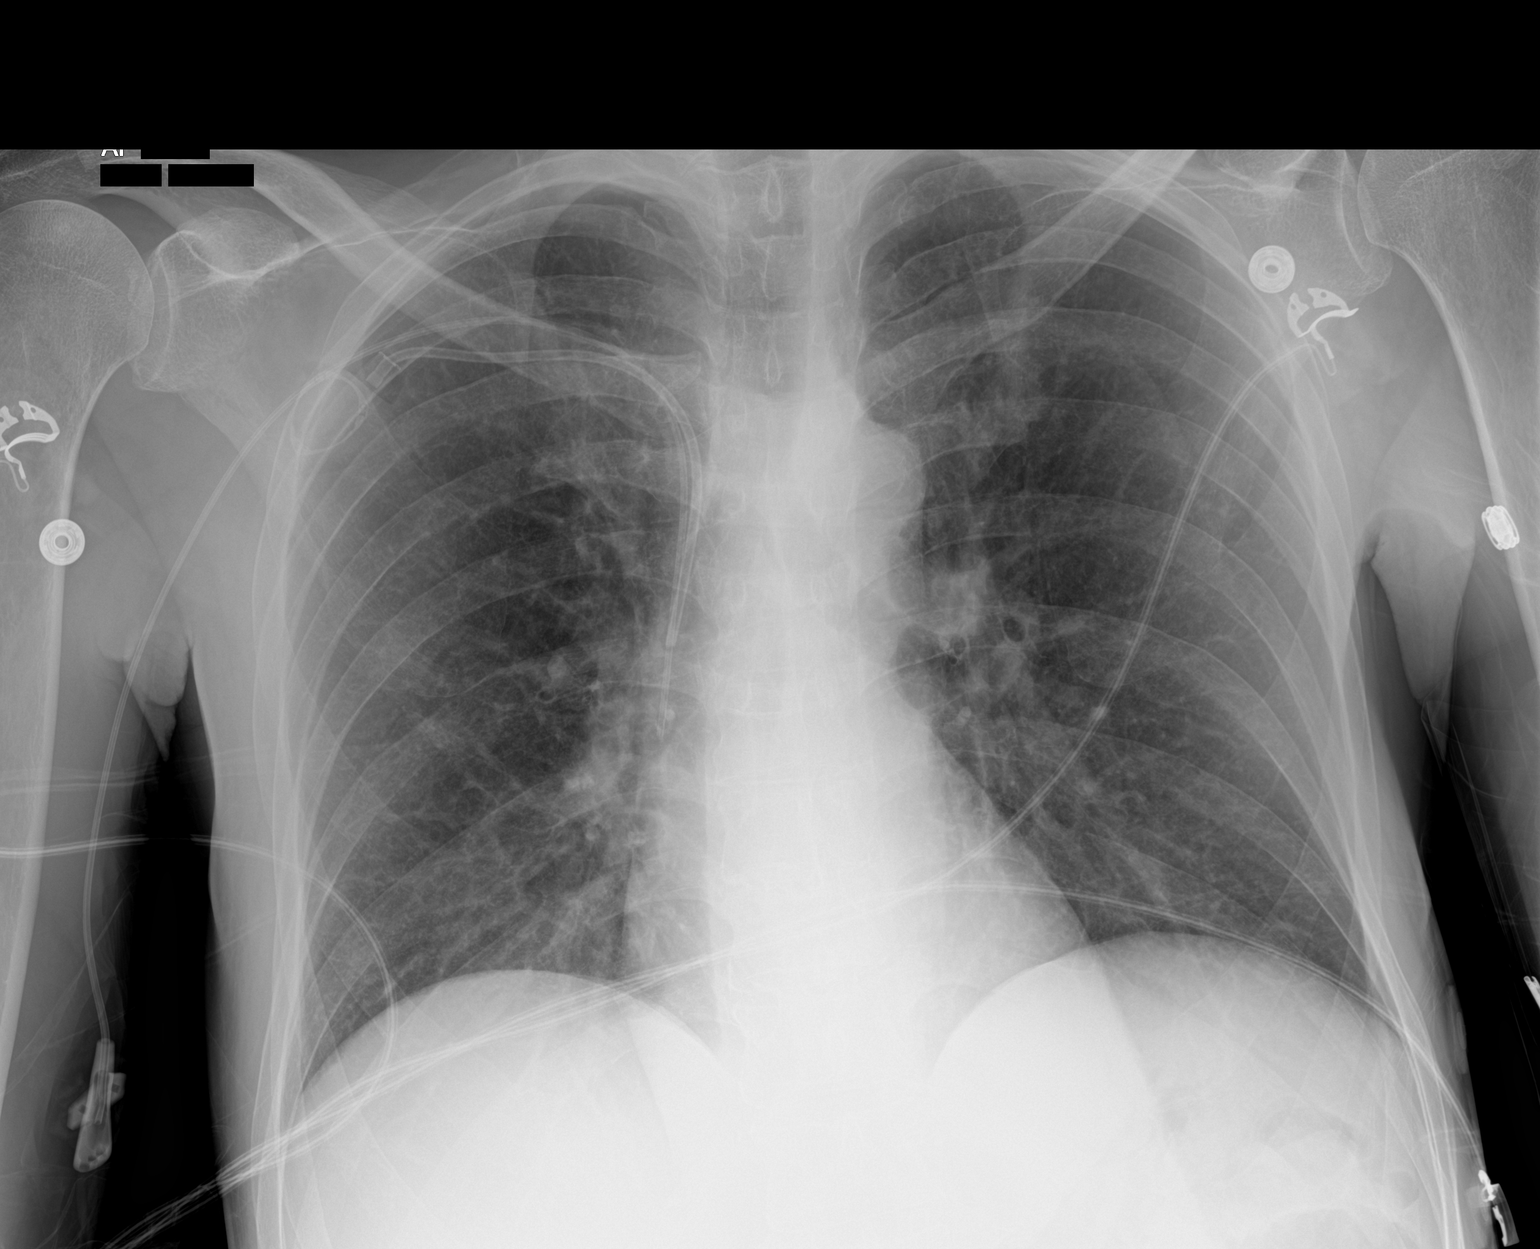

[1 of 1 positions shown; findings below may reference images not displayed]

FINDINGS: Right subclavian Port-A-Cath has been placed with the tip in the
SVC. Right PICC line in place with the tip in the SVC. No
pneumothorax. Heart is normal size. Lungs clear. No effusions or
acute bony abnormality.
IMPRESSION: Right Port-A-Cath and right PICC line tips are in the SVC. No
pneumothorax.

No acute cardiopulmonary disease.

## 2022-03-28 MED ORDER — TRAVASOL 10 % IV SOLN
INTRAVENOUS | Status: AC
  Filled 2022-03-28: qty 1296

## 2022-03-28 MED ORDER — PIPERACILLIN-TAZOBACTAM 3.375 G IVPB 30 MIN
3.3750 g | Freq: Three times a day (TID) | INTRAVENOUS | Status: DC
  Administered 2022-03-28 – 2022-03-30 (×6): 3.375 g via INTRAVENOUS
  Filled 2022-03-28 (×13): qty 50

## 2022-03-28 MED ORDER — ACETAMINOPHEN 325 MG PO TABS
650.0000 mg | ORAL_TABLET | Freq: Four times a day (QID) | ORAL | Status: DC | PRN
  Administered 2022-03-28 – 2022-04-04 (×5): 650 mg via ORAL
  Filled 2022-03-28 (×5): qty 2

## 2022-03-28 NOTE — Progress Notes (Signed)
PHARMACY - PHYSICIAN COMMUNICATION CRITICAL VALUE ALERT - BLOOD CULTURE IDENTIFICATION (BCID)  Eric [name] is an 51 y.o. male who presented to [facility] on 03/13/2022 with an intra-abdominal infection  Assessment:  1 of 4 bottles with GPC in chains, not identified on BCID   Name of physician (or Provider) Contacted: Dr. Grandville Silos  Current antibiotics: zosyn 3.375g IV q8h  Changes to prescribed antibiotics recommended:  Patient is on recommended antibiotics - No changes needed Follow-up organism identification  Results for orders placed or performed during the hospital encounter of 03/13/22  Blood Culture ID Panel (Reflexed) (Collected: 03/27/2022 10:59 AM)  Result Value Ref Range   Enterococcus faecalis NOT DETECTED NOT DETECTED   Enterococcus Faecium NOT DETECTED NOT DETECTED   Listeria monocytogenes NOT DETECTED NOT DETECTED   Staphylococcus species NOT DETECTED NOT DETECTED   Staphylococcus aureus (BCID) NOT DETECTED NOT DETECTED   Staphylococcus epidermidis NOT DETECTED NOT DETECTED   Staphylococcus lugdunensis NOT DETECTED NOT DETECTED   Streptococcus species NOT DETECTED NOT DETECTED   Streptococcus agalactiae NOT DETECTED NOT DETECTED   Streptococcus pneumoniae NOT DETECTED NOT DETECTED   Streptococcus pyogenes NOT DETECTED NOT DETECTED   A.calcoaceticus-baumannii NOT DETECTED NOT DETECTED   Bacteroides fragilis NOT DETECTED NOT DETECTED   Enterobacterales NOT DETECTED NOT DETECTED   Enterobacter cloacae complex NOT DETECTED NOT DETECTED   Escherichia coli NOT DETECTED NOT DETECTED   Klebsiella aerogenes NOT DETECTED NOT DETECTED   Klebsiella oxytoca NOT DETECTED NOT DETECTED   Klebsiella pneumoniae NOT DETECTED NOT DETECTED   Proteus species NOT DETECTED NOT DETECTED   Salmonella species NOT DETECTED NOT DETECTED   Serratia marcescens NOT DETECTED NOT DETECTED   Haemophilus influenzae NOT DETECTED NOT DETECTED   Neisseria meningitidis NOT DETECTED NOT DETECTED    Pseudomonas aeruginosa NOT DETECTED NOT DETECTED   Stenotrophomonas maltophilia NOT DETECTED NOT DETECTED   Candida albicans NOT DETECTED NOT DETECTED   Candida auris NOT DETECTED NOT DETECTED   Candida glabrata NOT DETECTED NOT DETECTED   Candida krusei NOT DETECTED NOT DETECTED   Candida parapsilosis NOT DETECTED NOT DETECTED   Candida tropicalis NOT DETECTED NOT DETECTED   Cryptococcus neoformans/gattii NOT DETECTED NOT [place], PharmD PGY2 Infectious Diseases Pharmacy Resident   Please check [email or web address] for unit-specific pharmacy phone numbers

## 2022-03-28 NOTE — Progress Notes (Signed)
Mobility Specialist Progress Note:   03/28/22 0920  Mobility  Activity Stood at bedside;Dangled on edge of bed  Level of Assistance Contact guard assist, steadying assist  Assistive Device Front wheel walker  Activity Response Tolerated poorly  $Mobility charge 1 Mobility   Pt eager for mobility session, states he is feeling improved. Pt moved to EOB with supervision, c/o dizziness. Dizziness resolved after ~8mn, pt wanting to stand up. Upon standing, pt displaying nystagmus, becoming unsteady, and was sat back down to recover. Further mobility deferred. SpO2 90% on RA throughout. Pt left in bed with bed alarm on. Will f/u later today for second session to re-attempt/sit up in chair.    ANelta NumbersAcute Rehab Secure Chat or Office Phone: [phone]

## 2022-03-28 NOTE — Plan of Care (Signed)
Vitals stable, pain 8-9/10, dilaudid IV given. Dressing changed, with abdominal binder. Problem: Education: Goal: Knowledge of General Education information will improve Description: Including pain rating scale, medication(s)/side effects and non-pharmacologic comfort measures Outcome: Progressing   Problem: Health Behavior/Discharge Planning: Goal: Ability to manage health-related needs will improve Outcome: Progressing   Problem: Clinical Measurements: Goal: Ability to maintain clinical measurements within normal limits will improve Outcome: Progressing Goal: Will remain free from infection Outcome: Progressing Goal: Diagnostic test results will improve Outcome: Progressing Goal: Respiratory complications will improve Outcome: Progressing Goal: Cardiovascular complication will be avoided Outcome: Progressing   Problem: Activity: Goal: Risk for activity intolerance will decrease Outcome: Progressing   Problem: Nutrition: Goal: Adequate nutrition will be maintained Outcome: Progressing   Problem: Coping: Goal: Level of anxiety will decrease Outcome: Progressing   Problem: Elimination: Goal: Will not experience complications related to bowel motility Outcome: Progressing Goal: Will not experience complications related to urinary retention Outcome: Progressing   Problem: Pain Managment: Goal: General experience of comfort will improve Outcome: Progressing   Problem: Safety: Goal: Ability to remain free from injury will improve Outcome: Progressing   Problem: Skin Integrity: Goal: Risk for impaired skin integrity will decrease Outcome: Progressing   Problem: Education: Goal: Ability to describe self-care measures that may prevent or decrease complications (Diabetes Survival Skills Education) will improve Outcome: Progressing Goal: Individualized Educational Video(s) Outcome: Progressing   Problem: Coping: Goal: Ability to adjust to condition or change in  health will improve Outcome: Progressing   Problem: Fluid Volume: Goal: Ability to maintain a balanced intake and output will improve Outcome: Progressing   Problem: Health Behavior/Discharge Planning: Goal: Ability to identify and utilize available resources and services will improve Outcome: Progressing Goal: Ability to manage health-related needs will improve Outcome: Progressing   Problem: Metabolic: Goal: Ability to maintain appropriate glucose levels will improve Outcome: Progressing   Problem: Nutritional: Goal: Maintenance of adequate nutrition will improve Outcome: Progressing Goal: Progress toward achieving an optimal weight will improve Outcome: Progressing   Problem: Skin Integrity: Goal: Risk for impaired skin integrity will decrease Outcome: Progressing   Problem: Tissue Perfusion: Goal: Adequacy of tissue perfusion will improve Outcome: Progressing

## 2022-03-28 NOTE — Progress Notes (Signed)
VASCULAR LAB    Bilateral lower extremity venous duplex has been performed.  See CV proc for preliminary results.   Natally Ribera, RVT 03/28/2022, 10:31 AM

## 2022-03-28 NOTE — Progress Notes (Addendum)
5 Days Post-Op  Subjective: CC Afebrile. HR improved. BP okay Feeling better overall. Some pain on the right upper portion of his abdomen, sharp. Tolerating more fld. Nausea improved. No emesis. Wants to tried solid food. Passing flatus. 2 liquid bm's yesterday. Voiding. Off o2. SOB improved. No CP. No more cough. Couldn't produce a sample. OOB to bathroom yesterday. Leg exercises in the bed.   Objective: Vital signs in last 24 hours: Temp:  [98.1 F (36.7 C)-99.1 F (37.3 C)] 99.1 F (37.3 C) (06/17 0741) Pulse Rate:  [103-109] 109 (06/17 0741) Resp:  [16-20] 16 (06/17 0741) BP: (106-130)/(70-87) 130/87 (06/17 0741) SpO2:  [93 %-96 %] 94 % (06/17 0741) Weight:  [74.4 kg] 74.4 kg (06/17 0500) Last BM Date :  (unknown)  Intake/Output from previous day: 06/16 0701 - 06/17 0700 In: 814.2 [P.O.:480; Blood:334.2] Out: 1600 [Urine:1600] Intake/Output this shift: No intake/output data recorded.  PE: Gen:  Alert, pleasant Card:  Tachycardic in the 100s.  Pulm:  His lungs appeared cta b/l on my exam. Normal rate and effort. Off o2. No cough.  Abd: Soft, mild distension, +bs. Some tenderness on the lower portions of his abdomen today, mainly near his incision but was otherwise NT. No peritonitis. Colostomy takedown site clean. Superior aspect of midline wound with very small area of skin separation (<1 cm) at the most superior aspect but otherwise is c/d/I. Inferior aspect of wound dressing removed with some serous like drainage on dressing. Wound stble from picture 6/16 w/ vicryl mesh at the base as seen in picture below with sutures that appear intact. Mepitel and wtd replaced. Ext:  No LE edema or calf ttp. Neg Homan's sign. Psych: A&Ox3  Skin: warm and dry  Lab Results:  Recent Labs    03/27/22 2114 03/28/22 0400  WBC 9.2 8.6  HGB 7.4* 7.4*  HCT 22.7* 23.7*  PLT 255 271   BMET Recent Labs    03/26/22 0426 03/27/22 0502  NA 129* 132*  K 4.8 4.0  CL 96* 99  CO2 22  22  GLUCOSE 129* 157*  BUN 19 27*  CREATININE 0.70 1.00  CALCIUM 8.2* 8.2*   PT/INR No results for input(s): "LABPROT", "INR" in the last 72 hours. CMP     Component Value Date/Time   NA 132 (L) 03/27/2022 0502   K 4.0 03/27/2022 0502   CL 99 03/27/2022 0502   CO2 22 03/27/2022 0502   GLUCOSE 157 (H) 03/27/2022 0502   BUN 27 (H) 03/27/2022 0502   CREATININE 1.00 03/27/2022 0502   CREATININE 1.18 02/20/2022 1117   CALCIUM 8.2 (L) 03/27/2022 0502   PROT 6.9 03/26/2022 0426   ALBUMIN 2.3 (L) 03/26/2022 0426   AST 40 03/26/2022 0426   AST 18 02/20/2022 1117   ALT 46 (H) 03/26/2022 0426   ALT 12 02/20/2022 1117   ALKPHOS 285 (H) 03/26/2022 0426   BILITOT 0.7 03/26/2022 0426   BILITOT 0.6 02/20/2022 1117   GFRNONAA >60 03/27/2022 0502   GFRNONAA >60 02/20/2022 1117   Lipase  No results found for: "LIPASE"  Studies/Results: CT ABDOMEN PELVIS W CONTRAST  Result Date: 03/27/2022 CLINICAL DATA:  Abdominal pain. Postop from ileostomy closure, cholecystectomy, and partial hepatectomy for hepatic metastases. Colon carcinoma. * Tracking Code: BO * EXAM: CT ABDOMEN AND PELVIS WITH CONTRAST TECHNIQUE: Multidetector CT imaging of the abdomen and pelvis was performed using the standard protocol following bolus administration of intravenous contrast. RADIATION DOSE REDUCTION: This exam was performed according to  the departmental dose-optimization program which includes automated exposure control, adjustment of the mA and/or kV according to patient size and/or use of iterative reconstruction technique. CONTRAST:  165m OMNIPAQUE IOHEXOL 300 MG/ML  SOLN COMPARISON:  03/19/2022 FINDINGS: Lower Chest: Stable small right pleural effusion, and right greater than left lower lobe atelectasis. Hepatobiliary: Wedge resection defect in the lateral right hepatic lobe contains fluid and gas and measures approximately 3 x 5 cm, without significant change since prior study. No hepatic masses are identified.  Postop changes are also seen from recent cholecystectomy, with small fluid gas collection seen in the cholecystectomy bed which measures 3.2 x 2.5 cm. Another small fluid collection is seen along the inferior margin of the left hepatic lobe measuring 2.2 x 1.6 cm. These findings also show no significant change compared prior exam. No evidence of biliary ductal dilatation. Pancreas:  No mass or inflammatory changes. Spleen: Within normal limits in size and appearance. Adrenals/Urinary Tract: No masses identified. No evidence of ureteral calculi or hydronephrosis. Small amount of gas in urinary bladder is likely due to recent instrumentation. Stomach/Bowel: A tiny amount of free intraperitoneal air is again seen, consistent recent surgery. Postop changes again seen from right colectomy. Mild diffuse dilatation of small bowel and colon is again seen, consistent with postop ileus. No evidence of bowel obstruction or abscess. No evidence of bowel obstruction. Vascular/Lymphatic: No pathologically enlarged lymph nodes. No acute vascular findings. Aortic atherosclerotic calcification incidentally noted. Reproductive:  No mass or other significant abnormality. Other:  None. Musculoskeletal:  No suspicious bone lesions identified. IMPRESSION: No significant change in postop fluid and gas collections in the gallbladder fossa, and right hepatic wedge resection bed. Stable tiny fluid collection along the inferior margin of the left hepatic lobe. No evidence of biliary ductal dilatation. Stable postop ileus. Tiny amount of residual postop free air again noted. Stable small right pleural effusion and bilateral lower lobe atelectasis. Aortic Atherosclerosis (ICD10-I70.0). Electronically Signed   By: JMarlaine HindM.D.   On: 03/27/2022 18:17   DG Abd 1 View  Result Date: 03/27/2022 CLINICAL DATA:  51year old male with history of abdominal pain. History of colon cancer with metastatic disease. EXAM: ABDOMEN - 1 VIEW COMPARISON:   Abdominal radiograph 03/18/2022. FINDINGS: Previously noted nasogastric tube has been removed. There are multiple prominent but nondilated loops of small bowel in the central abdomen. Mildly dilated loop of small bowel in the left upper quadrant measuring 3.2 cm in diameter. No pathologic dilatation of colon. Gas is noted throughout the colon and distal rectum. Surgical clips project over the right upper quadrant of the abdomen. Previously noted midline skin staples have been removed. IMPRESSION: 1. Nonspecific bowel-gas pattern, as above, favored to reflect a mild ileus. 2. No pneumoperitoneum. Electronically Signed   By: DVinnie LangtonM.D.   On: 03/27/2022 06:07   DG CHEST PORT 1 VIEW  Result Date: 03/26/2022 CLINICAL DATA:  Chest pain, shortness of breath EXAM: PORTABLE CHEST 1 VIEW COMPARISON:  Previous studies including the examination of 03/19/2022 FINDINGS: Cardiac size is within normal limits. There is interval decrease in interstitial markings in the parahilar regions. There is residual increased density in the right lower lung fields obscuring the right lateral costophrenic angle. Linear densities are seen in the medial left lower lung fields. Left lateral CP angle is clear. There is no pneumothorax. Tip of right chest port is seen in the superior vena cava. There is interval removal of enteric tube. There is interval placement of right PICC line  with its tip in the region of superior vena cava. IMPRESSION: There is interval decrease in interstitial markings in the parahilar regions. Residual increased density in the right lower lung fields may suggest right pleural effusion and possibly underlying atelectasis/pneumonia. Small linear densities in the medial left lower lung fields suggest subsegmental atelectasis. Electronically Signed   By: Elmer Picker M.D.   On: 03/26/2022 10:08    Anti-infectives: Anti-infectives (From admission, onward)    Start     Dose/Rate Route Frequency Ordered  Stop   03/23/22 1330  piperacillin-tazobactam (ZOSYN) IVPB 3.375 g        3.375 g 12.5 mL/hr over 240 Minutes Intravenous  Once 03/23/22 1239 03/23/22 1715   03/13/22 0645  cefoTEtan (CEFOTAN) 2 g in sodium chloride 0.9 % 100 mL IVPB        2 g 200 mL/hr over 30 Minutes Intravenous On call to O.R. 03/13/22 8315 03/13/22 0926        Assessment/Plan POD 15 s/p ex lap, intraoperative ultrasound of the liver, cholecystectomy, hepatic wedge resection x2 and ileostomy reversal by Dr. 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 [name] on 03/23/22 - Please see Dr. [name] op note from 6/12. Unable to perform formal closure safely; a piece of Vicryl mesh was sewed to the fascial edges circumferentially as a barrier. Discussed w/ WOCN yesterday about what would be the most protective dressing moving forward - Mepitel at base + TID wtd + abdominal binder.  - Cont TPN at full rate till tolerating more. Adv to soft diet + shakes - Surg path showed no malignancy in hepatic wedge resections, indicating necrotic tumor per Dr. Ayesha Rumpf prior progress notes - CT 6/16 overall stable with some postop fluid/gas collection in the GB fossa and right hepatic wedge resection bed.  Discussed with MD who is reviewing scan to see if we should have IR aspirate/drain any of these fluid collections - start IV Zosyn.  - Ambulate (TID in halls + PT) - Pulmonary toilet, IS   FEN - Soft, shakes, TPN (cont full dose), bowel regimen VTE - SCDs, Lovenox ID - Cefotetan periop. Start Zosyn. WBC 15>12.9>8.6 . Afebrile. CXR w/ RLL atelectasis vs PNA. Discussed w/ MD - wait on resp cx before tx. Cough has now resolved and off o2. Cont IS/FV. BCx NGTD. CT A/P as above, LE [place] pending. Does have PICC - if spikes another fever despite being started on abx could consider d/c.  Foley - Out,  voiding.    ABL anemia - s/p 1U PRBC 6/16. Hgb 7.1 > 7.4. Recheck in AM. Appears to be a component of IDA - cont iron (discussed with pharm 6/15 best option).  Tachycardia - Could be multifactorial (infectious, anemia, pain, etc). +4.7L since admit with good UOP (0.12m/kg/hr). Cr improved. Continue to monitor. Fever workup as above. Neg CTA 6/8. PRN IV BB. Pain meds changed/optimized 6/15.  Hyponatremia - On TPN. Improved to 134 today COPD: Weaned to RA. Continue home Breo ellipta, prn albuterol. Aggressive pulmonary toilet, IS/add flutter valve. CTA 6/8 w/o evidence of PE. Cont mucinex. R/L pleural effusions: Noted on CTA 6/8. Monitor. Pulm toilet.   LOS: 15 days    MJillyn Ledger, PNorthern [facility] Surgery [facility] LLCSurgery 03/28/2022, 8:09 AM Please see Amion for pager number during day hours 7:00am-4:30pm

## 2022-03-28 NOTE — Progress Notes (Signed)
PHARMACY - TOTAL PARENTERAL NUTRITION CONSULT NOTE  Indication: Prolonged ileus  Patient Measurements: Height: _0  (190.5 cm) Weight: 74.4 kg (164 lb 0.4 oz) IBW/kg (Calculated) : 84.5 TPN AdjBW (KG): 69.4 Body mass index is 20.5 kg/m. Usual Weight: 165 lb  Assessment:  72 yom with colon cancer with metastases to the liver. He underwent exlap LoA, cholecystectomy, wedge resection of liver lesions x2, and end ileostomy takedown on 6/2. Post-op he was put on clear liquids and was advanced to soft diet on 6/5 but had emesis that night. He is having loose bowel movements and passing flatus but post-op ileus suspected. NG tube was placed 6/7 and output continues to be high. Pharmacy consulted to dose TPN.   Glucose / Insulin: no hx, A1c 5.8 - CBGs 140-198 (?stress related) Used 12 units SSI used in last 24hr Electrolytes: Na up to 132, others WNL on 6/16 (Phos low normal after slight reduction) Renal: SCr < 1, BUN WNL Hepatic: LFTs / Tbili down WNL, Alk phos up 285, TG 208 >>> 90, albumin 2.3 Intake / Output; MIVF: UOP 0.9 ml/kg/hr, LBM on 6/11 GI Imaging: 6/7 AXR: post-op ileus 6/8 CT A/P: no abscess, possible adynamic ileus, no high-grade pSBO 6/16 AXR: mild elus GI Surgeries / Procedures:  6/12 - OR for wound dehisence > ex lap with insertion of mesh  Central access: double lumen PICC on 03/20/22 TPN start date: 03/20/22  Nutritional Goals:  RD Estimated Needs Total Energy Estimated Needs: 2400-2600 kcal/d Total Protein Estimated Needs: 125-140 g/d Total Fluid Estimated Needs: 2.4-2.6 L/d  Current Nutrition:  Full liquids and TPN - c/o nausea, hypoxia Ensure Max daily  Plan:  Continue TPN at 100 ml/hr to provide 130g AA and 2422 kCal, meeting 100% of needs Electrolytes in TPN: continue Na at 176mq/L (reduced free water in TPN on 6/15), continue K 422m/L, Ca 13m69mL, Mg 4mE104m, Phos 13mm44m, Cl:Ac 1:2  Add standard MVI and trace elements to TPN Continue moderate SSI  Q6H Monitor standard TPN labs on Mon/Thurs, repeating labs on 6/18 F/U PO intake/diet advancement   Eric LeiterrmD, BCPS, BCCCP Clinical Pharmacist Please refer to AMIONChristus [facility]MC PhWhite Meadow Lakeers 03/28/2022, 7:19 AM

## 2022-03-28 NOTE — Plan of Care (Signed)
  Problem: Safety: Goal: Ability to remain free from injury will improve Outcome: Progressing   Problem: Nutrition: Goal: Adequate nutrition will be maintained Outcome: Not Progressing   Problem: Pain Managment: Goal: General experience of comfort will improve Outcome: Not Progressing   

## 2022-03-28 NOTE — Progress Notes (Signed)
Mobility Specialist Progress Note:   03/28/22 1300  Mobility  Activity Ambulated with assistance in room  Level of Assistance Contact guard assist, steadying assist  Assistive Device Front wheel walker  Distance Ambulated (ft) 10 ft  Activity Response Tolerated fair  $Mobility charge 1 Mobility   Pt eager for second mobility session. Required CGA for safety. Pt able to ambulate 41f with RW. C/o minor dizziness throughout. Pt back in bed with all needs met.   ANelta NumbersAcute Rehab Secure Chat or Office Phone: [phone]

## 2022-03-29 LAB — BASIC METABOLIC PANEL
Anion gap: 8 (ref 5–15)
BUN: 16 mg/dL (ref 6–20)
CO2: 22 mmol/L (ref 22–32)
Calcium: 8.3 mg/dL — ABNORMAL LOW (ref 8.9–10.3)
Chloride: 101 mmol/L (ref 98–111)
Creatinine, Ser: 0.62 mg/dL (ref 0.61–1.24)
GFR, Estimated: >60 mL/min (ref >60)
Glucose, Bld: 129 mg/dL — ABNORMAL HIGH (ref 70–99)
Potassium: 4.5 mmol/L (ref 3.5–5.1)
Sodium: 131 mmol/L — ABNORMAL LOW (ref 135–145)

## 2022-03-29 LAB — MAGNESIUM: Magnesium: 2.2 mg/dL (ref 1.7–2.4)

## 2022-03-29 LAB — GLUCOSE, CAPILLARY
Glucose-Capillary: 129 mg/dL — ABNORMAL HIGH (ref 70–99)
Glucose-Capillary: 147 mg/dL — ABNORMAL HIGH (ref 70–99)
Glucose-Capillary: 126 mg/dL — ABNORMAL HIGH (ref 70–99)

## 2022-03-29 LAB — CBC
HCT: 25.7 % — ABNORMAL LOW (ref 39.0–52.0)
Hemoglobin: 8.2 g/dL — ABNORMAL LOW (ref 13.0–17.0)
MCH: 29 pg (ref 26.0–34.0)
MCHC: 31.9 g/dL (ref 30.0–36.0)
MCV: 90.8 fL (ref 80.0–100.0)
Platelets: 318 10*3/uL (ref 150–400)
RBC: 2.83 MIL/uL — ABNORMAL LOW (ref 4.22–5.81)
RDW: 16.8 % — ABNORMAL HIGH (ref 11.5–15.5)
WBC: 10.2 10*3/uL (ref 4.0–10.5)
nRBC: 0 % (ref 0.0–0.2)

## 2022-03-29 LAB — PHOSPHORUS: Phosphorus: 3.9 mg/dL (ref 2.5–4.6)

## 2022-03-29 MED ORDER — TRAVASOL 10 % IV SOLN
INTRAVENOUS | Status: AC
  Filled 2022-03-29: qty 1296

## 2022-03-29 MED ORDER — HYDROMORPHONE HCL 1 MG/ML IJ SOLN
0.5000 mg | INTRAMUSCULAR | Status: DC | PRN
  Administered 2022-03-29 – 2022-03-30 (×6): 1 mg via INTRAVENOUS
  Filled 2022-03-29 (×6): qty 1

## 2022-03-29 MED ORDER — INSULIN ASPART 100 UNIT/ML IJ SOLN
0.0000 [IU] | Freq: Three times a day (TID) | INTRAMUSCULAR | Status: DC
  Administered 2022-03-29 – 2022-03-30 (×3): 2 [IU] via SUBCUTANEOUS

## 2022-03-29 NOTE — Progress Notes (Signed)
Mobility Specialist Progress Note:   03/29/22 1415  Mobility  Activity  (bed level exercises)  Range of Motion/Exercises Active;All extremities  Level of Assistance Independent  Assistive Device None  Activity Response Tolerated poorly  $Mobility charge 1 Mobility   Pt still c/o significant R side pain, despite being premedicated. Pt declining OOB mobility, agreeing to second session of exercises. Noted pt to be SOB, SpO2 84% on RA. Donned 2LO2, SpO2 90%. Pt left with all needs met. RN notified.  Nelta Numbers Acute Rehab Secure Chat or Office Phone: [phone]

## 2022-03-29 NOTE — Progress Notes (Signed)
Mobility Specialist Progress Note:   03/29/22 1000  Mobility  Activity  (bed level exercises)  Level of Assistance Independent  Assistive Device None  Activity Response Tolerated poorly  $Mobility charge 1 Mobility   Pt presenting with increased RUQ pain this am, stating "it feels like someone punched me". Declined OOB mobility, agreed to bed level exercises. Able to perform a few before fatigue and pain significantly increased. Further mobility deferred, will f/u for second session later today. RN aware.   Nelta Numbers Acute Rehab Secure Chat or Office Phone: [phone]

## 2022-03-29 NOTE — Progress Notes (Signed)
6 Days Post-Op  Subjective: CC Afebrile. Mild tachycardia. BP high Having pain on the right upper portion of his abdomen, sharp. Tolerating some, didn't eat much yesterday. No nausea. No emesis.  Voiding.   Objective: Vital signs in last 24 hours: Temp:  [98.1 F (36.7 C)-100.4 F (38 C)] 98.4 F (36.9 C) (06/18 0842) Pulse Rate:  [100-114] 106 (06/18 0842) Resp:  [16-20] 18 (06/18 0842) BP: (130-150)/(89-100) 133/93 (06/18 0842) SpO2:  [93 %-97 %] 94 % (06/18 0842) Weight:  [77.2 kg] 77.2 kg (06/18 0500) Last BM Date : 03/27/22 (Per pt)  Intake/Output from previous day: 06/17 0701 - 06/18 0700 In: 150 [IV Piggyback:150] Out: 300 [Urine:300] Intake/Output this shift: No intake/output data recorded.  PE: Gen:  Alert, pleasant Card:  Tachycardic in the 100s.  Pulm:  His lungs are cta b/l, Normal rate and effort.  Abd: Soft, min distension, +bs. Some tenderness on the lower portions of his abdomen  No peritonitis.  Ext:  No LE edema or calf ttp.  Psych: A&Ox3  Skin: warm and dry  Lab Results:  Recent Labs    03/28/22 0400 03/29/22 0332  WBC 8.6 10.2  HGB 7.4* 8.2*  HCT 23.7* 25.7*  PLT 271 318    BMET Recent Labs    03/28/22 0400 03/29/22 0332  NA 134* 131*  K 4.8 4.5  CL 103 101  CO2 23 22  GLUCOSE 144* 129*  BUN 19 16  CREATININE 0.70 0.62  CALCIUM 8.1* 8.3*    PT/INR No results for input(s): "LABPROT", "INR" in the last 72 hours. CMP     Component Value Date/Time   NA 131 (L) 03/29/2022 0332   K 4.5 03/29/2022 0332   CL 101 03/29/2022 0332   CO2 22 03/29/2022 0332   GLUCOSE 129 (H) 03/29/2022 0332   BUN 16 03/29/2022 0332   CREATININE 0.62 03/29/2022 0332   CREATININE 1.18 02/20/2022 1117   CALCIUM 8.3 (L) 03/29/2022 0332   PROT 6.9 03/26/2022 0426   ALBUMIN 2.3 (L) 03/26/2022 0426   AST 40 03/26/2022 0426   AST 18 02/20/2022 1117   ALT 46 (H) 03/26/2022 0426   ALT 12 02/20/2022 1117   ALKPHOS 285 (H) 03/26/2022 0426   BILITOT  0.7 03/26/2022 0426   BILITOT 0.6 02/20/2022 1117   GFRNONAA >60 03/29/2022 0332   GFRNONAA >60 02/20/2022 1117   Lipase  No results found for: "LIPASE"  Studies/Results: VAS [place] LOWER EXTREMITY VENOUS (DVT)  Result Date: 03/28/2022  Lower Venous DVT Study Patient Name:  Eric [name]  Date of Exam:   03/28/2022 Medical Rec #: [id number]     Accession #:    [id number] Date of Birth: 28-Nov-1970     Patient Gender: M Patient Age:   51 years Exam Location:  [facility] Procedure:      VAS [place] LOWER EXTREMITY VENOUS (DVT) Referring Phys: MICHAEL MACZIS --------------------------------------------------------------------------------  Indications: Fever. Other Indications: S/p exploratory laparotomy with insertion of vicryl mesh for                    abdominal dehiscence by Dr. [name] on 03/23/22. Performing Technologist: Sharion Dove RVS  Examination Guidelines: A complete evaluation includes B-mode imaging, spectral Doppler, color Doppler, and power Doppler as needed of all accessible portions of each vessel. Bilateral testing is considered an integral part of a complete examination. Limited examinations for reoccurring indications may be performed as noted. The reflux portion of the exam is  performed with the patient in reverse Trendelenburg.  +---------+---------------+---------+-----------+----------+--------------+ RIGHT    CompressibilityPhasicitySpontaneityPropertiesThrombus Aging +---------+---------------+---------+-----------+----------+--------------+ CFV      Full           Yes      Yes                                 +---------+---------------+---------+-----------+----------+--------------+ SFJ      Full                                                        +---------+---------------+---------+-----------+----------+--------------+ FV Prox  Full                                                         +---------+---------------+---------+-----------+----------+--------------+ FV Mid   Full                                                        +---------+---------------+---------+-----------+----------+--------------+ FV DistalFull                                                        +---------+---------------+---------+-----------+----------+--------------+ PFV      Full                                                        +---------+---------------+---------+-----------+----------+--------------+ POP      Full           Yes      Yes                                 +---------+---------------+---------+-----------+----------+--------------+ PTV      Full                                                        +---------+---------------+---------+-----------+----------+--------------+ PERO     Full                                                        +---------+---------------+---------+-----------+----------+--------------+   +---------+---------------+---------+-----------+----------+--------------+ LEFT     CompressibilityPhasicitySpontaneityPropertiesThrombus Aging +---------+---------------+---------+-----------+----------+--------------+ CFV      Full           Yes      Yes                                 +---------+---------------+---------+-----------+----------+--------------+  SFJ      Full                                                        +---------+---------------+---------+-----------+----------+--------------+ FV Prox  Full                                                        +---------+---------------+---------+-----------+----------+--------------+ FV Mid   Full                                                        +---------+---------------+---------+-----------+----------+--------------+ FV DistalFull                                                         +---------+---------------+---------+-----------+----------+--------------+ PFV      Full                                                        +---------+---------------+---------+-----------+----------+--------------+ POP      Full           Yes      Yes                                 +---------+---------------+---------+-----------+----------+--------------+ PTV      Full                                                        +---------+---------------+---------+-----------+----------+--------------+ PERO     Full                                                        +---------+---------------+---------+-----------+----------+--------------+    Summary: BILATERAL: - No evidence of deep vein thrombosis seen in the lower extremities, bilaterally. -No evidence of popliteal cyst, bilaterally.   *See table(s) above for measurements and observations.    Preliminary    CT ABDOMEN PELVIS W CONTRAST  Result Date: 03/27/2022 CLINICAL DATA:  Abdominal pain. Postop from ileostomy closure, cholecystectomy, and partial hepatectomy for hepatic metastases. Colon carcinoma. * Tracking Code: BO * EXAM: CT ABDOMEN AND PELVIS WITH CONTRAST TECHNIQUE: Multidetector CT imaging of the abdomen and pelvis was performed using the standard protocol following bolus administration of intravenous contrast. RADIATION DOSE REDUCTION: This exam was performed according to the departmental dose-optimization  program which includes automated exposure control, adjustment of the mA and/or kV according to patient size and/or use of iterative reconstruction technique. CONTRAST:  123m OMNIPAQUE IOHEXOL 300 MG/ML  SOLN COMPARISON:  03/19/2022 FINDINGS: Lower Chest: Stable small right pleural effusion, and right greater than left lower lobe atelectasis. Hepatobiliary: Wedge resection defect in the lateral right hepatic lobe contains fluid and gas and measures approximately 3 x 5 cm, without significant change since prior  study. No hepatic masses are identified. Postop changes are also seen from recent cholecystectomy, with small fluid gas collection seen in the cholecystectomy bed which measures 3.2 x 2.5 cm. Another small fluid collection is seen along the inferior margin of the left hepatic lobe measuring 2.2 x 1.6 cm. These findings also show no significant change compared prior exam. No evidence of biliary ductal dilatation. Pancreas:  No mass or inflammatory changes. Spleen: Within normal limits in size and appearance. Adrenals/Urinary Tract: No masses identified. No evidence of ureteral calculi or hydronephrosis. Small amount of gas in urinary bladder is likely due to recent instrumentation. Stomach/Bowel: A tiny amount of free intraperitoneal air is again seen, consistent recent surgery. Postop changes again seen from right colectomy. Mild diffuse dilatation of small bowel and colon is again seen, consistent with postop ileus. No evidence of bowel obstruction or abscess. No evidence of bowel obstruction. Vascular/Lymphatic: No pathologically enlarged lymph nodes. No acute vascular findings. Aortic atherosclerotic calcification incidentally noted. Reproductive:  No mass or other significant abnormality. Other:  None. Musculoskeletal:  No suspicious bone lesions identified. IMPRESSION: No significant change in postop fluid and gas collections in the gallbladder fossa, and right hepatic wedge resection bed. Stable tiny fluid collection along the inferior margin of the left hepatic lobe. No evidence of biliary ductal dilatation. Stable postop ileus. Tiny amount of residual postop free air again noted. Stable small right pleural effusion and bilateral lower lobe atelectasis. Aortic Atherosclerosis (ICD10-I70.0). Electronically Signed   By: JMarlaine HindM.D.   On: 03/27/2022 18:17    Anti-infectives: Anti-infectives (From admission, onward)    Start     Dose/Rate Route Frequency Ordered Stop   03/28/22 1400   piperacillin-tazobactam (ZOSYN) IVPB 3.375 g        3.375 g 100 mL/hr over 30 Minutes Intravenous Every 8 hours 03/28/22 1044     03/23/22 1330  piperacillin-tazobactam (ZOSYN) IVPB 3.375 g        3.375 g 12.5 mL/hr over 240 Minutes Intravenous  Once 03/23/22 1239 03/23/22 1715   03/13/22 0645  cefoTEtan (CEFOTAN) 2 g in sodium chloride 0.9 % 100 mL IVPB        2 g 200 mL/hr over 30 Minutes Intravenous On call to O.R. 03/13/22 0160106/02/23 0926        Assessment/Plan POD 16 s/p ex lap, intraoperative ultrasound of the liver, cholecystectomy, hepatic wedge resection x2 and ileostomy reversal by Dr. AZenia Resideson 6/2 for hx of stage IV colon cancer with liver metastases s/p R hemicolectomy and chemo POD 6 s/p exploratory laparotomy with insertion of vicryl mesh for abdominal dehiscence by Dr. WRedmond Pullingon 03/23/22 - Please see Dr. WRedmond Pullingop note from 6/12. Unable to perform formal closure safely; a piece of Vicryl mesh was sewed to the fascial edges circumferentially as a barrier. Discussed w/ WOCN yesterday about what would be the most protective dressing moving forward - Mepitel at base + TID wtd + abdominal binder.  - Cont TPN at full rate till tolerating more. Cont soft diet + protein  shakes - Surg path showed no malignancy in hepatic wedge resections, indicating necrotic tumor per Dr. Ayesha Rumpf prior progress notes - CT 6/16 overall stable with some postop fluid/gas collection in the GB fossa and right hepatic wedge resection bed.   - IV Zosyn.  - Ambulate (TID in halls + PT) - Pulmonary toilet, IS   FEN - Soft, shakes, TPN (cont full dose), bowel regimen VTE - SCDs, Lovenox ID - Cefotetan periop. Zosyn started 6/17. WBC 15>12.9>8.6 . Afebrile. CXR w/ RLL atelectasis vs PNA. Discussed w/ MD - wait on resp cx before tx. Cough has now resolved and off o2. Cont IS/FV. BCx NGTD. CT A/P as above, LE [place] prelim neg. Does have PICC - if spikes another fever despite being started on abx could consider d/c.   Foley - Out, voiding.    ABL anemia - s/p 1U PRBC 6/16. Hgb 7.1 > 7.4. Recheck in AM. Appears to be a component of IDA - cont iron (discussed with pharm 6/15 best option).  Tachycardia - Could be multifactorial (infectious, anemia, pain, etc). +4.7L since admit with good UOP (0.72m/kg/hr). Cr improved. Continue to monitor. Fever workup as above. Neg CTA 6/8. PRN IV BB. Pain meds changed/optimized 6/15.  Hyponatremia - On TPN. Improved to 134 today COPD: Weaned to RA. Continue home Breo ellipta, prn albuterol. Aggressive pulmonary toilet, IS/add flutter valve. CTA 6/8 w/o evidence of PE. Cont mucinex. R/L pleural effusions: Noted on CTA 6/8. Monitor. Pulm toilet.   LOS: 16 days    ARosario Adie, MSeminole ManorSurgery 03/29/2022, 9:48 AM Please see Amion for pager number during day hours 7:00am-4:30pm

## 2022-03-29 NOTE — Progress Notes (Signed)
PHARMACY - TOTAL PARENTERAL NUTRITION CONSULT NOTE  Indication: Prolonged ileus  Patient Measurements: Height: 6' 3" (190.5 cm) Weight: 77.2 kg (170 lb 3.1 oz) IBW/kg (Calculated) : 84.5 TPN AdjBW (KG): 69.4 Body mass index is 21.27 kg/m. Usual Weight: 165 lb  Assessment:  50 yom with colon cancer with metastases to the liver. He underwent exlap LoA, cholecystectomy, wedge resection of liver lesions x2, and end ileostomy takedown on 6/2. Post-op he was put on clear liquids and was advanced to soft diet on 6/5 but had emesis that night. He is having loose bowel movements and passing flatus but post-op ileus suspected. NG tube was placed 6/7 and output continues to be high. Pharmacy consulted to dose TPN.   Glucose / Insulin: no hx, A1c 5.8 - CBGs <180, Used 9 units SSI last 24hr Electrolytes: Na 131, others WNL, CoCa 9.66 Renal: Scr 0.6, BUN WNL Hepatic: LFTs / Tbili WNL, Alk phos up 285, TG 90, albumin 2.3 Intake / Output; MIVF: UOP 300ml (incomplete charting), LBM on 6/11 GI Imaging: 6/7 KUB: post-op ileus 6/8 CT A/P: no abscess, possible adynamic ileus 6/16 KUB: mild ileus 6/16 CT Abd: stable postop ileus  GI Surgeries / Procedures:  6/2 ileostomy reversal, cholecystectomy, and partial hepatectomy 6/12 ex lap, mesh for abd dehiscence, partial abd closure  Central access: double lumen PICC on 03/20/22 TPN start date: 03/20/22  Nutritional Goals:  RD Estimated Needs Total Energy Estimated Needs: 2400-2600 kcal/d Total Protein Estimated Needs: 125-140 g/d Total Fluid Estimated Needs: 2.4-2.6 L/d  Current Nutrition:  TPN   6/17 soft diet: only drank milk and juice due to abdominal pain, afraid to try Ensure, which is being offered to him   Plan:  Continue TPN at 100 ml/hr to provide 130g AA and 2422 kCal, meeting 100% of needs Electrolytes in TPN: Increase Na 150 mEq/L; Decrease K 30 mEq/L, Phos 10 mmol/L; Continue Ca 5mEq/L, Mg 4 mEq/L, Cl:Ac 1:2  Add standard MVI and trace  elements to TPN; change to oral tablet when eating regularly Change moderate SSI to TID with meals  Monitor standard TPN labs on Mon/Thurs  F/U PO intake/diet advancement    , PharmD, BCPS, BCCP Clinical Pharmacist  Please check AMION for all [facility] Pharmacy phone numbers After 10:00 PM, call Main Pharmacy [phone]  

## 2022-03-30 DIAGNOSIS — C189 Malignant neoplasm of colon, unspecified: Principal | ICD-10-CM

## 2022-03-30 DIAGNOSIS — B952 Enterococcus as the cause of diseases classified elsewhere: Secondary | ICD-10-CM

## 2022-03-30 DIAGNOSIS — C787 Secondary malignant neoplasm of liver and intrahepatic bile duct: Secondary | ICD-10-CM

## 2022-03-30 LAB — COMPREHENSIVE METABOLIC PANEL
ALT: 78 U/L — ABNORMAL HIGH (ref 0–44)
AST: 40 U/L (ref 15–41)
Albumin: 2.2 g/dL — ABNORMAL LOW (ref 3.5–5.0)
Alkaline Phosphatase: 239 U/L — ABNORMAL HIGH (ref 38–126)
Anion gap: 9 (ref 5–15)
BUN: 15 mg/dL (ref 6–20)
CO2: 22 mmol/L (ref 22–32)
Calcium: 8.3 mg/dL — ABNORMAL LOW (ref 8.9–10.3)
Chloride: 100 mmol/L (ref 98–111)
Creatinine, Ser: 0.64 mg/dL (ref 0.61–1.24)
GFR, Estimated: >60 mL/min (ref >60)
Glucose, Bld: 126 mg/dL — ABNORMAL HIGH (ref 70–99)
Potassium: 4.3 mmol/L (ref 3.5–5.1)
Sodium: 131 mmol/L — ABNORMAL LOW (ref 135–145)
Total Bilirubin: 0.7 mg/dL (ref 0.3–1.2)
Total Protein: 6.7 g/dL (ref 6.5–8.1)

## 2022-03-30 LAB — CULTURE, BLOOD (ROUTINE X 2): Special Requests: ADEQUATE

## 2022-03-30 LAB — GLUCOSE, CAPILLARY
Glucose-Capillary: 115 mg/dL — ABNORMAL HIGH (ref 70–99)
Glucose-Capillary: 124 mg/dL — ABNORMAL HIGH (ref 70–99)
Glucose-Capillary: 130 mg/dL — ABNORMAL HIGH (ref 70–99)
Glucose-Capillary: 132 mg/dL — ABNORMAL HIGH (ref 70–99)
Glucose-Capillary: 141 mg/dL — ABNORMAL HIGH (ref 70–99)

## 2022-03-30 LAB — TRIGLYCERIDES: Triglycerides: 96 mg/dL (ref <150)

## 2022-03-30 LAB — MAGNESIUM: Magnesium: 2.1 mg/dL (ref 1.7–2.4)

## 2022-03-30 LAB — PHOSPHORUS: Phosphorus: 4.1 mg/dL (ref 2.5–4.6)

## 2022-03-30 MED ORDER — POLYETHYLENE GLYCOL 3350 17 G PO PACK: 17.0000 g | PACK | Freq: Every day | ORAL | Status: DC | PRN

## 2022-03-30 MED ORDER — METHOCARBAMOL 500 MG PO TABS
1000.0000 mg | ORAL_TABLET | Freq: Three times a day (TID) | ORAL | Status: DC
  Administered 2022-03-30 – 2022-04-05 (×26): 1000 mg via ORAL
  Filled 2022-03-30 (×26): qty 2

## 2022-03-30 MED ORDER — TRAVASOL 10 % IV SOLN
INTRAVENOUS | Status: AC
  Filled 2022-03-30: qty 648

## 2022-03-30 MED ORDER — SODIUM CHLORIDE 0.9 % IV SOLN
3.0000 g | Freq: Four times a day (QID) | INTRAVENOUS | Status: DC
  Administered 2022-03-30 – 2022-04-04 (×20): 3 g via INTRAVENOUS
  Filled 2022-03-30 (×20): qty 8

## 2022-03-30 MED ORDER — PANTOPRAZOLE SODIUM 40 MG PO TBEC
40.0000 mg | DELAYED_RELEASE_TABLET | Freq: Every day | ORAL | Status: DC
  Administered 2022-03-30 – 2022-04-05 (×7): 40 mg via ORAL
  Filled 2022-03-30 (×7): qty 1

## 2022-03-30 MED ORDER — HYDROMORPHONE HCL 1 MG/ML IJ SOLN
0.5000 mg | INTRAMUSCULAR | Status: DC | PRN
  Administered 2022-03-30 – 2022-04-01 (×14): 0.5 mg via INTRAVENOUS
  Filled 2022-03-30 (×14): qty 0.5

## 2022-03-30 MED ORDER — INSULIN ASPART 100 UNIT/ML IJ SOLN
0.0000 [IU] | Freq: Three times a day (TID) | INTRAMUSCULAR | Status: DC
  Administered 2022-03-30 – 2022-04-01 (×6): 1 [IU] via SUBCUTANEOUS
  Administered 2022-04-02: 3 [IU] via SUBCUTANEOUS
  Administered 2022-04-03 – 2022-04-04 (×3): 1 [IU] via SUBCUTANEOUS

## 2022-03-30 NOTE — Progress Notes (Signed)
PHARMACY - TOTAL PARENTERAL NUTRITION CONSULT NOTE  Indication: Prolonged ileus  Patient Measurements: Height: 6\' 3"  (190.5 cm) Weight: 77.2 kg (170 lb 3.1 oz) IBW/kg (Calculated) : 84.5 TPN AdjBW (KG): 69.4 Body mass index is 21.27 kg/m. Usual Weight: 165 lb  Assessment:  44 yom with colon cancer with metastases to the liver. He underwent exlap LoA, cholecystectomy, wedge resection of liver lesions x2, and end ileostomy takedown on 6/2. Post-op he was put on clear liquids and was advanced to soft diet on 6/5 but had emesis that night. He is having loose bowel movements and passing flatus but post-op ileus suspected. NG tube was placed 6/7 and output continues to be high. Pharmacy consulted to dose TPN.   Per surgery note, patient reports having bowel movements. PO intake seems to be slowly improving. Patient confirmed able to tolerate solid foods (chicken/mashed potatoes) yesterday with no nausea/vomiting.   Glucose / Insulin: no hx, A1c 5.8 - CBGs <150, Used 4 units SSI last 24hr Electrolytes: Na 131, K 4.3, CoCa 9.7, others WNL Renal: Scr 0.64, BUN WNL Hepatic: AST wnl, ALT up 78, Tbili WNL, Alk phos down 239, TG 96, albumin 2.2 Intake / Output; MIVF: UOP up 1875 mL (charting improved), LBM charted 6/16  GI Imaging: 6/7 KUB: post-op ileus 6/8 CT A/P: no abscess, possible adynamic ileus 6/16 KUB: mild ileus 6/16 CT Abd: stable postop ileus  GI Surgeries / Procedures:  6/2 ileostomy reversal, cholecystectomy, and partial hepatectomy 6/12 ex lap, mesh for abd dehiscence, partial abd closure  Central access: double lumen PICC on 03/20/22 TPN start date: 03/20/22  Nutritional Goals:  RD Estimated Needs Total Energy Estimated Needs: 2400-2600 kcal/d Total Protein Estimated Needs: 125-140 g/d Total Fluid Estimated Needs: 2.4-2.6 L/d  Current Nutrition:  TPN   6/17 soft diet: only drank milk and juice due to abdominal pain, afraid to try Ensure, which is being offered to him    Plan:  Decrease TPN to 50 mL/hr (1/2 rate) to provide 65g AA and 1211 kCal, meeting 50% of needs Electrolytes in TPN: Na 150 mEq/L; K 30 mEq/L, Phos 10 mmol/L; Ca 20mEq/L, Mg 4 mEq/L, Cl:Ac 1:2 (all lytes to decrease with rate decrease) Add standard MVI and trace elements to TPN; change to oral tablet when eating regularly Change sensitive SSI to TID with meals  Monitor standard TPN labs on Mon/Thurs  F/U PO intake/diet advancement    Thank you for allowing pharmacy to be a part of this patient's care.  Ardyth Harps, PharmD Clinical Pharmacist

## 2022-03-30 NOTE — Progress Notes (Addendum)
Nutrition Follow-up  DOCUMENTATION CODES:   Severe malnutrition in context of chronic illness  INTERVENTION:   Offer Ensure Plus High Protein po TID, each supplement provides 350 kcal and 20 grams of protein.  Continue TPN at 50 ml/h for now.   Continue calorie count for 48 hours to evaluate adequacy of oral intake for appropriate dosing of TPN.  NUTRITION DIAGNOSIS:   Severe Malnutrition related to chronic illness (metastatic colon cancer) as evidenced by severe fat depletion, severe muscle depletion.  Ongoing   GOAL:   Patient will meet greater than or equal to 90% of their needs  Met with TPN.   MONITOR:   Diet advancement, Labs, Weight trends, I & O's  REASON FOR ASSESSMENT:   Consult Calorie Count  ASSESSMENT:   Pt with COPD and metastatic colon cancer, admitted for open ileostomy reversal and open hepatic wedge resection x2.  Patient states that he is having difficulty eating d/t abdominal pain. He tolerates beverages better than solid food. Mashed potatoes and gravy is something he tolerates well. He has not been ordering his meals; he just eats a small amount of whatever is sent on his tray. Encouraged patient to order foods that he thinks he will tolerate. He refused the Ensure supplement this morning because he doesn't want a regular Ensure, he wants the one with more calories and protein. Explained that the Ensure that is ordered is high in calories and protein, so he agreed to drink it. RD provided patient with a strawberry Ensure Plus High Protein and patient took a few sips. He states that he gets sleepy sometimes and falls asleep and doesn't finish it. Encouraged patient to drink as much as he can of the Ensure supplements.   Calorie count was ordered this morning. For breakfast today, he drank half of the milk and his juice, but ate nothing. RD placed calorie count envelope on patient's door for meal tickets to be saved.   Currently receiving TPN at 100 ml/h  to provide 2400 ml, 2421 kcal, and 130 gm protein daily. TPN is being decreased to 50 ml/h today to provide 1200 ml, 1211 kcal, 65 gm protein per day. This will meet 50% of kcal and protein needs.  Labs reviewed. Na 131 (L), alk phos 239 (H) CBG: 130-124  Medications reviewed and include Novolog, Protonix.   Diet Order:   Diet Order             DIET SOFT Room service appropriate? Yes; Fluid consistency: Thin  Diet effective now                   EDUCATION NEEDS:   Education needs have been addressed  Skin:  Skin Assessment: Skin Integrity Issues: Skin Integrity Issues:: Incisions Incisions: abdomen (closed)  Last BM:  6/11 - type 7  Height:   Ht Readings from Last 1 Encounters:  03/13/22 $RemoveB'6\' 3"'wZPInihT$  (1.905 m)    Weight:   Wt Readings from Last 1 Encounters:  03/29/22 77.2 kg     BMI:  Body mass index is 21.27 kg/m.  Estimated Nutritional Needs:   Kcal:  2400-2600 kcal/d  Protein:  125-140 g/d  Fluid:  2.4-2.6 L/d    Lucas Mallow RD, LDN, CNSC Please refer to Amion for contact information.

## 2022-03-30 NOTE — Progress Notes (Signed)
PT Cancellation Note  Patient Details Name: Eric [name] MRN: [id number] DOB: 04-Dec-1970   Cancelled Treatment:    Reason Eval/Treat Not Completed: Patient declined, no reason specified;Fatigue/lethargy limiting ability to participate. Will re-attempt later today as time allows.    Lylie Blacklock 03/30/2022, 11:57 AM

## 2022-03-30 NOTE — Progress Notes (Signed)
7 Days Post-Op  Subjective: No acute changes. Afebrile. Reports he is having bowel movements. PO intake seems to be slowly improving. Ate mashed potatoes and chicken yesterday. Denies nausea and vomiting. On oxygen this morning.   Objective: Vital signs in last 24 hours: Temp:  [98.3 F (36.8 C)-98.9 F (37.2 C)] 98.3 F (36.8 C) (06/19 0453) Pulse Rate:  [99-108] 99 (06/19 0453) Resp:  [17-20] 20 (06/19 0453) BP: (129-150)/(90-100) 138/100 (06/19 0453) SpO2:  [93 %-100 %] 97 % (06/19 0453) Last BM Date : 03/27/22 (Per pt)  Intake/Output from previous day: 06/18 0701 - 06/19 0700 In: 541.2 [IV Piggyback:541.2] Out: 2475 [Urine:2475] Intake/Output this shift: No intake/output data recorded.  PE: General: resting comfortably, NAD Neuro: alert and oriented, no focal deficits Resp: normal work of breathing on nasal cannula Abdomen: soft, nondistended, nontender to palpation. RLQ prior stoma site is granulating. Midline is open, wound base is clean with vicryl mesh in tact. No surrounding skin erythema or induration. Extremities: warm and well-perfused   Lab Results:  Recent Labs    03/28/22 0400 03/29/22 0332  WBC 8.6 10.2  HGB 7.4* 8.2*  HCT 23.7* 25.7*  PLT 271 318   BMET Recent Labs    03/29/22 0332 03/30/22 0349  NA 131* 131*  K 4.5 4.3  CL 101 100  CO2 22 22  GLUCOSE 129* 126*  BUN 16 15  CREATININE 0.62 0.64  CALCIUM 8.3* 8.3*   PT/INR No results for input(s): "LABPROT", "INR" in the last 72 hours. CMP     Component Value Date/Time   NA 131 (L) 03/30/2022 0349   K 4.3 03/30/2022 0349   CL 100 03/30/2022 0349   CO2 22 03/30/2022 0349   GLUCOSE 126 (H) 03/30/2022 0349   BUN 15 03/30/2022 0349   CREATININE 0.64 03/30/2022 0349   CREATININE 1.18 02/20/2022 1117   CALCIUM 8.3 (L) 03/30/2022 0349   PROT 6.7 03/30/2022 0349   ALBUMIN 2.2 (L) 03/30/2022 0349   AST 40 03/30/2022 0349   AST 18 02/20/2022 1117   ALT 78 (H) 03/30/2022 0349   ALT  12 02/20/2022 1117   ALKPHOS 239 (H) 03/30/2022 0349   BILITOT 0.7 03/30/2022 0349   BILITOT 0.6 02/20/2022 1117   GFRNONAA >60 03/30/2022 0349   GFRNONAA >60 02/20/2022 1117   Lipase  No results found for: "LIPASE"     Studies/Results: VAS [place] LOWER EXTREMITY VENOUS (DVT)  Result Date: 03/29/2022  Lower Venous DVT Study Patient Name:  MEHUL RUDIN  Date of Exam:   03/28/2022 Medical Rec #: [id number]     Accession #:    [id number] Date of Birth: 01-Feb-1971     Patient Gender: M Patient Age:   51 years Exam Location:  [facility] Procedure:      VAS [place] LOWER EXTREMITY VENOUS (DVT) Referring Phys: MICHAEL MACZIS --------------------------------------------------------------------------------  Indications: Fever. Other Indications: S/p exploratory laparotomy with insertion of vicryl mesh for                    abdominal dehiscence by Dr. [name] on 03/23/22. Performing Technologist: Sharion Dove RVS  Examination Guidelines: A complete evaluation includes B-mode imaging, spectral Doppler, color Doppler, and power Doppler as needed of all accessible portions of each vessel. Bilateral testing is considered an integral part of a complete examination. Limited examinations for reoccurring indications may be performed as noted. The reflux portion of the exam is performed with the patient in reverse Trendelenburg.  +---------+---------------+---------+-----------+----------+--------------+  RIGHT    CompressibilityPhasicitySpontaneityPropertiesThrombus Aging +---------+---------------+---------+-----------+----------+--------------+ CFV      Full           Yes      Yes                                 +---------+---------------+---------+-----------+----------+--------------+ SFJ      Full                                                        +---------+---------------+---------+-----------+----------+--------------+ FV Prox  Full                                                         +---------+---------------+---------+-----------+----------+--------------+ FV Mid   Full                                                        +---------+---------------+---------+-----------+----------+--------------+ FV DistalFull                                                        +---------+---------------+---------+-----------+----------+--------------+ PFV      Full                                                        +---------+---------------+---------+-----------+----------+--------------+ POP      Full           Yes      Yes                                 +---------+---------------+---------+-----------+----------+--------------+ PTV      Full                                                        +---------+---------------+---------+-----------+----------+--------------+ PERO     Full                                                        +---------+---------------+---------+-----------+----------+--------------+   +---------+---------------+---------+-----------+----------+--------------+ LEFT     CompressibilityPhasicitySpontaneityPropertiesThrombus Aging +---------+---------------+---------+-----------+----------+--------------+ CFV      Full           Yes      Yes                                 +---------+---------------+---------+-----------+----------+--------------+  SFJ      Full                                                        +---------+---------------+---------+-----------+----------+--------------+ FV Prox  Full                                                        +---------+---------------+---------+-----------+----------+--------------+ FV Mid   Full                                                        +---------+---------------+---------+-----------+----------+--------------+ FV DistalFull                                                         +---------+---------------+---------+-----------+----------+--------------+ PFV      Full                                                        +---------+---------------+---------+-----------+----------+--------------+ POP      Full           Yes      Yes                                 +---------+---------------+---------+-----------+----------+--------------+ PTV      Full                                                        +---------+---------------+---------+-----------+----------+--------------+ PERO     Full                                                        +---------+---------------+---------+-----------+----------+--------------+     Summary: BILATERAL: - No evidence of deep vein thrombosis seen in the lower extremities, bilaterally. -No evidence of popliteal cyst, bilaterally.   *See table(s) above for measurements and observations. Electronically signed by Jamelle Haring on 03/29/2022 at 11:42:43 AM.    Final        Assessment/Plan 51 yo male with stage IV colon cancer. POD17 s/p hepatic wedge resection and ileostomy reversal on 6/2. POD7 s/p takeback for fascial dehiscence with placement of vicryl mesh bridge 6/12 (Dr. [name]).  - FEN: Decrease TPN to 1/2 rate tonight to help stimulate appetite, and begin calorie count. Continue soft diet. - Wound care:  Mepitel to base of midline wound, cover with saline WTD and change BID. Saline WTD to prior ostomy site. Abdominal binder when out of bed. - ID: Zosyn started 6/17 for fevers, blood cultures with Enterococcus avium. Susceptibilities pending. Duplex US negative for DVT. - Pain control: continue scheduled tylenol and po robaxin. Wean dilaudid and oxycodone. - Ambulate TID - COPD: Home Breo ellipta, prn albuterol. Pulmonary toilet.  - VTE: lovenox, SCDs - Dispo: inpatient, will need home health nursing at discharge for wound care.   LOS: 17 days    Michaelle Birks, MD [facility] Surgery General,  Hepatobiliary and Pancreatic Surgery 03/30/22 7:03 AM

## 2022-03-30 NOTE — Consult Note (Signed)
[facility] for Infectious Disease       Reason for Consult: Enteroccal bacteremia    Referring Physician: [facility] [employer]  Principal Problem:   Colon cancer metastasized to liver [facility]) Active Problems:   Ileus, postoperative (HCC)    Chlorhexidine Gluconate Cloth  6 each Topical Daily   enoxaparin (LOVENOX) injection  40 mg Subcutaneous Q24H   feeding supplement  237 mL Oral TID BM   fluticasone  2 spray Each Nare Daily   fluticasone furoate-vilanterol  1 puff Inhalation Daily   guaiFENesin  600 mg Oral BID   insulin aspart  0-9 Units Subcutaneous TID WC   methocarbamol  1,000 mg Oral TID AC & HS   pantoprazole  40 mg Oral Daily   sodium chloride flush  10-40 mL Intracatheter Q12H   sodium chloride flush  10-40 mL Intracatheter Q12H    Recommendations: Will change piperacillin/Tazobactam to ampicillin/sulbactam Repeat blood cultures Would consider a total 3-4 week course of antibiotics due to presence of mesh Can use amoxicillin/sulbactam at discharge  Assessment: He has bacteremia with Enterococcus in 1 set, likely GI source and was associated with fever and leukocytosis, now resolved.  Low probability of endocarditis if repeat blood cultures remain negative.   Will continue to monitor.   Antibiotics: Piperacillin/tazobactam day 3  HPI: Eric [name] is a 51 y.o. male with a history of colon cancer with liver metastatic disease s/p exlap, cholecystectomy, wedge resection of the lever and end ileostomy takedown done on 6/2 came in with emesis.  He has no complaints.   Blood cultures sent on 6/16 with ongoing leukocytosis and fever up to 100.4 and 1/2 sets positive for Enterococcus avium in both bottles.  Pansensitive.  Mesh placed on 6/12.  REmains on TPN but tolerating po intake.    Review of Systems:  Constitutional: negative for fevers and chills All other systems reviewed and are negative    Past Medical History:  Diagnosis Date   Cancer ([place])    Colon    COPD (chronic obstructive pulmonary disease) ([place])    COVID-19 virus infection 11/15/2020   Dyspnea     Social History   Tobacco Use   Smoking status: Some Days    Packs/day: 0.25    Years: 30.00    Total pack years: 7.50    Types: Cigarettes, E-cigarettes   Smokeless tobacco: Never   Tobacco comments:    Pt currently using vape and 1-2 cigarettes every few days ARJ 02/04/22  Vaping Use   Vaping Use: Every day  Substance Use Topics   Alcohol use: Not Currently   Drug use: Yes    Types: Marijuana    Comment: Last used 03/12/22 at bed time    Family History  Problem Relation Age of Onset   Diabetes Sister    Colon cancer Other        Family member unknown   Esophageal cancer Neg Hx    Stomach cancer Neg Hx    Pancreatic cancer Neg Hx    Colon polyps Neg Hx     No Known Allergies  Physical Exam: Constitutional: in no apparent distress  Vitals:   03/30/22 0812 03/30/22 0838  BP:  131/90  Pulse:  (!) 102  Resp:  16  Temp:  98.2 F (36.8 C)  SpO2: 97% 98%   EYES: anicteric ENMT: poor dentition Respiratory: normal respiratory effort  Lab Results  Component Value Date   WBC 10.2 03/29/2022   HGB 8.2 (L)  03/29/2022   HCT 25.7 (L) 03/29/2022   MCV 90.8 03/29/2022   PLT 318 03/29/2022    Lab Results  Component Value Date   CREATININE 0.64 03/30/2022   BUN 15 03/30/2022   NA 131 (L) 03/30/2022   K 4.3 03/30/2022   CL 100 03/30/2022   CO2 22 03/30/2022    Lab Results  Component Value Date   ALT 78 (H) 03/30/2022   AST 40 03/30/2022   ALKPHOS 239 (H) 03/30/2022     Microbiology: Recent Results (from the past 240 hour(s))  Culture, blood (Routine X 2) w Reflex to ID Panel     Status: Abnormal   Collection Time: 03/27/22 10:59 AM   Specimen: BLOOD  Result Value Ref Range Status   Specimen Description BLOOD LEFT ANTECUBITAL  Final   Special Requests   Final    BOTTLES DRAWN AEROBIC AND ANAEROBIC Blood Culture adequate volume   Culture  Setup Time    Final    GRAM POSITIVE COCCI IN CHAINS IN BOTH AEROBIC AND ANAEROBIC BOTTLES CRITICAL RESULT CALLED TO, READ BACK BY AND VERIFIED WITH: Eric [name] [id number] AT 1218 BY CM Performed at [facility], [place] [address] [place], [place] [address]    Culture ENTEROCOCCUS AVIUM (A)  Final   Report Status 03/30/2022 FINAL  Final   Organism ID, Bacteria ENTEROCOCCUS AVIUM  Final      Susceptibility   Enterococcus avium - MIC*    AMPICILLIN <=2 SENSITIVE Sensitive     VANCOMYCIN <=0.5 SENSITIVE Sensitive     GENTAMICIN SYNERGY SENSITIVE Sensitive     * ENTEROCOCCUS AVIUM  Culture, blood (Routine X 2) w Reflex to ID Panel     Status: None (Preliminary result)   Collection Time: 03/27/22 10:59 AM   Specimen: BLOOD LEFT HAND  Result Value Ref Range Status   Specimen Description BLOOD LEFT HAND  Final   Special Requests   Final    BOTTLES DRAWN AEROBIC AND ANAEROBIC Blood Culture results may not be optimal due to an inadequate volume of blood received in culture bottles   Culture   Final    NO GROWTH 3 DAYS Performed at [facility], [place] [address] [place], [place] [address]    Report Status PENDING  Incomplete  Blood Culture ID Panel (Reflexed)     Status: None   Collection Time: 03/27/22 10:59 AM  Result Value Ref Range Status   Enterococcus faecalis NOT DETECTED NOT DETECTED Final   Enterococcus Faecium NOT DETECTED NOT DETECTED Final   Listeria monocytogenes NOT DETECTED NOT DETECTED Final   Staphylococcus species NOT DETECTED NOT DETECTED Final   Staphylococcus aureus (BCID) NOT DETECTED NOT DETECTED Final   Staphylococcus epidermidis NOT DETECTED NOT DETECTED Final   Staphylococcus lugdunensis NOT DETECTED NOT DETECTED Final   Streptococcus species NOT DETECTED NOT DETECTED Final   Streptococcus agalactiae NOT DETECTED NOT DETECTED Final   Streptococcus pneumoniae NOT DETECTED NOT DETECTED Final   Streptococcus pyogenes NOT DETECTED NOT DETECTED Final    A.calcoaceticus-baumannii NOT DETECTED NOT DETECTED Final   Bacteroides fragilis NOT DETECTED NOT DETECTED Final   Enterobacterales NOT DETECTED NOT DETECTED Final   Enterobacter cloacae complex NOT DETECTED NOT DETECTED Final   Escherichia coli NOT DETECTED NOT DETECTED Final   Klebsiella aerogenes NOT DETECTED NOT DETECTED Final   Klebsiella oxytoca NOT DETECTED NOT DETECTED Final   Klebsiella pneumoniae NOT DETECTED NOT DETECTED Final   Proteus species NOT DETECTED NOT DETECTED Final  Salmonella species NOT DETECTED NOT DETECTED Final   Serratia marcescens NOT DETECTED NOT DETECTED Final   Haemophilus influenzae NOT DETECTED NOT DETECTED Final   Neisseria meningitidis NOT DETECTED NOT DETECTED Final   Pseudomonas aeruginosa NOT DETECTED NOT DETECTED Final   Stenotrophomonas maltophilia NOT DETECTED NOT DETECTED Final   Candida albicans NOT DETECTED NOT DETECTED Final   Candida auris NOT DETECTED NOT DETECTED Final   Candida glabrata NOT DETECTED NOT DETECTED Final   Candida krusei NOT DETECTED NOT DETECTED Final   Candida parapsilosis NOT DETECTED NOT DETECTED Final   Candida tropicalis NOT DETECTED NOT DETECTED Final   Cryptococcus neoformans/gattii NOT DETECTED NOT DETECTED Final    Comment: Performed at [facility], [place] [address], [place], [place] [address]    Karion Cudd W Valaree Fresquez, [place] for Infectious Disease [facility] [email or web address] 03/30/2022, 11:33 AM

## 2022-03-31 ENCOUNTER — Inpatient Hospital Stay (HOSPITAL_COMMUNITY): Payer: 59

## 2022-03-31 DIAGNOSIS — R7881 Bacteremia: Secondary | ICD-10-CM

## 2022-03-31 LAB — ECHOCARDIOGRAM LIMITED
Area-P 1/2: 3.77 cm2
Height: 75 in
S' Lateral: 2.1 cm
Weight: 2723.12 oz

## 2022-03-31 LAB — GLUCOSE, CAPILLARY
Glucose-Capillary: 128 mg/dL — ABNORMAL HIGH (ref 70–99)
Glucose-Capillary: 130 mg/dL — ABNORMAL HIGH (ref 70–99)
Glucose-Capillary: 127 mg/dL — ABNORMAL HIGH (ref 70–99)
Glucose-Capillary: 118 mg/dL — ABNORMAL HIGH (ref 70–99)

## 2022-03-31 MED ORDER — POLYETHYLENE GLYCOL 3350 17 G PO PACK
17.0000 g | PACK | Freq: Every day | ORAL | Status: DC
  Administered 2022-03-31 – 2022-04-05 (×6): 17 g via ORAL
  Filled 2022-03-31 (×6): qty 1

## 2022-03-31 MED ORDER — TRAVASOL 10 % IV SOLN
INTRAVENOUS | Status: AC
  Filled 2022-03-31: qty 648

## 2022-03-31 MED ORDER — OXYCODONE HCL 5 MG PO TABS
10.0000 mg | ORAL_TABLET | ORAL | Status: DC | PRN
  Administered 2022-04-01 – 2022-04-05 (×20): 10 mg via ORAL
  Filled 2022-03-31 (×21): qty 2

## 2022-03-31 MED ORDER — ADULT MULTIVITAMIN W/MINERALS CH
1.0000 | ORAL_TABLET | Freq: Every day | ORAL | Status: DC
  Administered 2022-03-31 – 2022-04-05 (×6): 1 via ORAL
  Filled 2022-03-31 (×6): qty 1

## 2022-03-31 NOTE — Progress Notes (Signed)
Mobility Specialist Progress Note:   03/31/22 1425  Mobility  Activity Ambulated with assistance in hallway  Level of Assistance Contact guard assist, steadying assist  Assistive Device Front wheel walker  Distance Ambulated (ft) 200 ft  Activity Response Tolerated well  $Mobility charge 1 Mobility   Pt eager for mobility session. Pt with minor pain at EOS, otherwise asx. Pt back in bed with all needs met, bed alarm on.   Nelta Numbers Acute Rehab Secure Chat or Office Phone: [phone]

## 2022-03-31 NOTE — Progress Notes (Signed)
[facility] for Infectious Disease   Reason for visit: Follow up on bacteremia  Interval History: repeat blood cultures sent and now growing GPC in chains in one bottle; remains afebrile.    Day 2 ampicillin/sulbactam Day 4 total antibiotics  Physical Exam: Constitutional:  Vitals:   03/31/22 0404 03/31/22 0747  BP: 131/88 129/90  Pulse: 100 93  Resp: 18 17  Temp: 98.2 F (36.8 C) 98 F (36.7 C)  SpO2: 96% 97%   patient appears in NAD Respiratory: Normal respiratory effort Skin: no rashes   Review of Systems: Constitutional: negative for fevers and chills  Lab Results  Component Value Date   WBC 10.2 03/29/2022   HGB 8.2 (L) 03/29/2022   HCT 25.7 (L) 03/29/2022   MCV 90.8 03/29/2022   PLT 318 03/29/2022    Lab Results  Component Value Date   CREATININE 0.64 03/30/2022   BUN 15 03/30/2022   NA 131 (L) 03/30/2022   K 4.3 03/30/2022   CL 100 03/30/2022   CO2 22 03/30/2022    Lab Results  Component Value Date   ALT 78 (H) 03/30/2022   AST 40 03/30/2022   ALKPHOS 239 (H) 03/30/2022     Microbiology: Recent Results (from the past 240 hour(s))  Culture, blood (Routine X 2) w Reflex to ID Panel     Status: Abnormal   Collection Time: 03/27/22 10:59 AM   Specimen: BLOOD  Result Value Ref Range Status   Specimen Description BLOOD LEFT ANTECUBITAL  Final   Special Requests   Final    BOTTLES DRAWN AEROBIC AND ANAEROBIC Blood Culture adequate volume   Culture  Setup Time   Final    GRAM POSITIVE COCCI IN CHAINS IN BOTH AEROBIC AND ANAEROBIC BOTTLES CRITICAL RESULT CALLED TO, READ BACK BY AND VERIFIED WITH: Jacklynn Barnacle [id number] AT 1218 BY CM Performed at [facility], [place] [address] [place], [place] [address]    Culture ENTEROCOCCUS AVIUM (A)  Final   Report Status 03/30/2022 FINAL  Final   Organism ID, Bacteria ENTEROCOCCUS AVIUM  Final      Susceptibility   Enterococcus avium - MIC*    AMPICILLIN <=2 SENSITIVE Sensitive     VANCOMYCIN  <=0.5 SENSITIVE Sensitive     GENTAMICIN SYNERGY SENSITIVE Sensitive     * ENTEROCOCCUS AVIUM  Culture, blood (Routine X 2) w Reflex to ID Panel     Status: None (Preliminary result)   Collection Time: 03/27/22 10:59 AM   Specimen: BLOOD LEFT HAND  Result Value Ref Range Status   Specimen Description BLOOD LEFT HAND  Final   Special Requests   Final    BOTTLES DRAWN AEROBIC AND ANAEROBIC Blood Culture results may not be optimal due to an inadequate volume of blood received in culture bottles   Culture   Final    NO GROWTH 4 DAYS Performed at [facility], [place] [address] [place], [place] [address]    Report Status PENDING  Incomplete  Blood Culture ID Panel (Reflexed)     Status: None   Collection Time: 03/27/22 10:59 AM  Result Value Ref Range Status   Enterococcus faecalis NOT DETECTED NOT DETECTED Final   Enterococcus Faecium NOT DETECTED NOT DETECTED Final   Listeria monocytogenes NOT DETECTED NOT DETECTED Final   Staphylococcus species NOT DETECTED NOT DETECTED Final   Staphylococcus aureus (BCID) NOT DETECTED NOT DETECTED Final   Staphylococcus epidermidis NOT DETECTED NOT DETECTED Final   Staphylococcus  lugdunensis NOT DETECTED NOT DETECTED Final   Streptococcus species NOT DETECTED NOT DETECTED Final   Streptococcus agalactiae NOT DETECTED NOT DETECTED Final   Streptococcus pneumoniae NOT DETECTED NOT DETECTED Final   Streptococcus pyogenes NOT DETECTED NOT DETECTED Final   A.calcoaceticus-baumannii NOT DETECTED NOT DETECTED Final   Bacteroides fragilis NOT DETECTED NOT DETECTED Final   Enterobacterales NOT DETECTED NOT DETECTED Final   Enterobacter cloacae complex NOT DETECTED NOT DETECTED Final   Escherichia coli NOT DETECTED NOT DETECTED Final   Klebsiella aerogenes NOT DETECTED NOT DETECTED Final   Klebsiella oxytoca NOT DETECTED NOT DETECTED Final   Klebsiella pneumoniae NOT DETECTED NOT DETECTED Final   Proteus species NOT DETECTED NOT DETECTED Final    Salmonella species NOT DETECTED NOT DETECTED Final   Serratia marcescens NOT DETECTED NOT DETECTED Final   Haemophilus influenzae NOT DETECTED NOT DETECTED Final   Neisseria meningitidis NOT DETECTED NOT DETECTED Final   Pseudomonas aeruginosa NOT DETECTED NOT DETECTED Final   Stenotrophomonas maltophilia NOT DETECTED NOT DETECTED Final   Candida albicans NOT DETECTED NOT DETECTED Final   Candida auris NOT DETECTED NOT DETECTED Final   Candida glabrata NOT DETECTED NOT DETECTED Final   Candida krusei NOT DETECTED NOT DETECTED Final   Candida parapsilosis NOT DETECTED NOT DETECTED Final   Candida tropicalis NOT DETECTED NOT DETECTED Final   Cryptococcus neoformans/gattii NOT DETECTED NOT DETECTED Final    Comment: Performed at [facility], [address], [place], [place] [address]  Culture, blood (Routine X 2) w Reflex to ID Panel     Status: None (Preliminary result)   Collection Time: 03/30/22  1:24 PM   Specimen: BLOOD  Result Value Ref Range Status   Specimen Description BLOOD LEFT ANTECUBITAL  Final   Special Requests   Final    BOTTLES DRAWN AEROBIC AND ANAEROBIC Blood Culture adequate volume   Culture  Setup Time   Final    GRAM POSITIVE COCCI IN CHAINS ANAEROBIC BOTTLE ONLY CRITICAL VALUE NOTED.  VALUE IS CONSISTENT WITH PREVIOUSLY REPORTED AND CALLED VALUE. Performed at [facility], [place] [address], [place], [place] [address]    Culture GRAM POSITIVE COCCI  Final   Report Status PENDING  Incomplete  Culture, blood (Routine X 2) w Reflex to ID Panel     Status: None (Preliminary result)   Collection Time: 03/30/22  1:24 PM   Specimen: BLOOD LEFT FOREARM  Result Value Ref Range Status   Specimen Description BLOOD LEFT FOREARM  Final   Special Requests   Final    BOTTLES DRAWN AEROBIC AND ANAEROBIC Blood Culture adequate volume   Culture   Final    NO GROWTH < 24 HOURS Performed at [facility], [place] [address] [place], [place] [address]    Report  Status PENDING  Incomplete    Impression/Plan:  1. Enterococcus avium bacteremia - on ampicillin/sulbactam for coverage including broad coverage for intraabdominal process.  Repeat blood culture again positive with a GPC in chains in one bottle so far which may also be Enterococcus again.   I will check a TTE  2. Ileus - on TPN but he is taking some po intake.  He has a picc line for this and in the setting of bacteremia, it is ideal to have it removed.  If the plan is to wean him off TPN, can defer line removal for now and hopefully be able to remove it soon.    3.  Fluid  collection at site of liver resection - repeat culture looks to be the same, with speciation likely by tomorrow to see if it is persistent Enterococcus.

## 2022-03-31 NOTE — Plan of Care (Signed)

## 2022-03-31 NOTE — Progress Notes (Signed)
PHARMACY - TOTAL PARENTERAL NUTRITION CONSULT NOTE  Indication: Prolonged ileus  Patient Measurements: Height: 6\' 3"  (190.5 cm) Weight: 77.2 kg (170 lb 3.1 oz) IBW/kg (Calculated) : 84.5 TPN AdjBW (KG): 69.4 Body mass index is 21.27 kg/m. Usual Weight: 165 lb  Assessment:  13 yom with colon cancer with metastases to the liver. He underwent exlap LoA, cholecystectomy, wedge resection of liver lesions x2, and end ileostomy takedown on 6/2. Post-op he was put on clear liquids and was advanced to soft diet on 6/5 but had emesis that night. He is having loose bowel movements and passing flatus but post-op ileus suspected. NG tube was placed 6/7 and output continues to be high. Pharmacy consulted to dose TPN.   Per surgery note, patient reports having bowel movements. PO intake seems to be slowly improving. Patient confirmed able to tolerate solid foods (chicken/mashed potatoes/rice) 6/18 and 6/19 with no nausea/vomiting but only small percentages of each meal   Glucose / Insulin: no hx, A1c 5.8 - CBGs <150, Used 4 units SSI last 24hr Electrolytes: Na 131, K 4.3, CoCa 9.7, others WNL Renal: Scr 0.64, BUN WNL Hepatic: AST wnl, ALT up 78, Tbili WNL, Alk phos down 239, TG 96, albumin 2.2 Intake / Output; MIVF: UOP up 1875 mL (charting improved), LBM charted 6/16  GI Imaging: 6/7 KUB: post-op ileus 6/8 CT A/P: no abscess, possible adynamic ileus 6/16 KUB: mild ileus 6/16 CT Abd: stable postop ileus  GI Surgeries / Procedures:  6/2 ileostomy reversal, cholecystectomy, and partial hepatectomy 6/12 ex lap, mesh for abd dehiscence, partial abd closure  Central access: double lumen PICC on 03/20/22 TPN start date: 03/20/22  Nutritional Goals:  RD Estimated Needs Total Energy Estimated Needs: 2400-2600 kcal/d Total Protein Estimated Needs: 125-140 g/d Total Fluid Estimated Needs: 2.4-2.6 L/d  Current Nutrition:  TPN   soft diet  Plan:  Continue TPN at 50 mL/hr (1/2 rate) to provide 65g AA  and 1211 kCal, meeting 50% of needs Electrolytes in TPN: Na 150 mEq/L; K 30 mEq/L, Phos 10 mmol/L; Ca 81mEq/L, Mg 4 mEq/L, Cl:Ac 1:2 (all lytes to decrease with rate decrease) standard MVI and trace elements to PO Change sensitive SSI to TID with meals  Monitor standard TPN labs on Mon/Thurs  F/U PO intake/diet advancement    Thank you for allowing pharmacy to be a part of this patient's care.  Ardyth Harps, PharmD Clinical Pharmacist

## 2022-03-31 NOTE — Progress Notes (Signed)
  Echocardiogram 2D Echocardiogram has been performed.  Bobbye Charleston 03/31/2022, 2:01 PM

## 2022-03-31 NOTE — Progress Notes (Addendum)
Physical Therapy Treatment Patient Details Name: Eric [name] MRN: [id number] DOB: Apr 29, 1971 Today's Date: 03/31/2022   History of Present Illness Pt is 51 yr old M admitted on 03/13/22 for planned ileostomy reversal, wedge resection of liver x 2 and cholecystectomy.  Had second explratory lap 03/23/22 due to wound dehiscence. Vicryl mesh application. PMH: Colon CA, COPD    PT Comments    Pt progressing towards his physical therapy goals; agreeable to participate and premedicated prior to session. Pt ambulating 15 ft, then an additional 20 ft with a walker and seated rest break in between bouts. HR peak 123 bpm, SpO2 95% on 2L O2. Will continue to progress mobility as tolerated.    Recommendations for follow up therapy are one component of a multi-disciplinary discharge planning process, led by the attending physician.  Recommendations may be updated based on patient status, additional functional criteria and insurance authorization.  Follow Up Recommendations  Home health PT     Assistance Recommended at Discharge PRN  Patient can return home with the following Help with stairs or ramp for entrance;Assist for transportation;A little help with walking and/or transfers;A little help with bathing/dressing/bathroom;Assistance with cooking/housework   Equipment Recommendations  None recommended by PT    Recommendations for Other Services       Precautions / Restrictions Precautions Precautions: Fall Restrictions Weight Bearing Restrictions: No Other Position/Activity Restrictions: Abdominal binder OOB     Mobility  Bed Mobility Overal bed mobility: Modified Independent             General bed mobility comments: HOB elevated    Transfers Overall transfer level: Needs assistance Equipment used: Rolling walker (2 wheels) Transfers: Sit to/from Stand Sit to Stand: Min guard           General transfer comment: Cues for hand placement     Ambulation/Gait Ambulation/Gait assistance: Min guard Gait Distance (Feet): 35 Feet (15 ft, then 20 ft) Assistive device: Rolling walker (2 wheels) Gait Pattern/deviations: Step-through pattern, Decreased stride length Gait velocity: decreased     General Gait Details: Min guard for safety. Pt ambulated 15 ft, then an additional 20 ft with seated rest break in between   Stairs             Wheelchair Mobility    Modified Rankin (Stroke Patients Only)       Balance Overall balance assessment: Needs assistance Sitting-balance support: Feet supported Sitting balance-Leahy Scale: Good     Standing balance support: Bilateral upper extremity supported, During functional activity, Reliant on assistive device for balance Standing balance-Leahy Scale: Fair                              Cognition Arousal/Alertness: Awake/alert Behavior During Therapy: WFL for tasks assessed/performed Overall Cognitive Status: Within Functional Limits for tasks assessed                                          Exercises General Exercises - Lower Extremity Long Arc Quad: Both, 10 reps, Seated Heel Slides: Both, 10 reps, Supine Hip Flexion/Marching: Both, 10 reps, Seated    General Comments        Pertinent Vitals/Pain Pain Assessment Pain Assessment: Faces Faces Pain Scale: Hurts whole lot Pain Location: abdomen Pain Descriptors / Indicators: Stabbing, Grimacing, Guarding Pain Intervention(s): Monitored during session, Limited activity within patient's  tolerance, Premedicated before session    Home Living                          Prior Function            PT Goals (current goals can now be found in the care plan section) Acute Rehab PT Goals Patient Stated Goal: to return home with brother Time For Goal Achievement: 04/14/22 Potential to Achieve Goals: Good Progress towards PT goals: Progressing toward goals    Frequency     Min 3X/week      PT Plan Current plan remains appropriate    Co-evaluation              AM-PAC PT "6 Clicks" Mobility   Outcome Measure  Help needed turning from your back to your side while in a flat bed without using bedrails?: None Help needed moving from lying on your back to sitting on the side of a flat bed without using bedrails?: None Help needed moving to and from a bed to a chair (including a wheelchair)?: A Little Help needed standing up from a chair using your arms (e.g., wheelchair or bedside chair)?: A Little Help needed to walk in hospital room?: A Little Help needed climbing 3-5 steps with a railing? : A Lot 6 Click Score: 19    End of Session Equipment Utilized During Treatment: Oxygen Activity Tolerance: Patient tolerated treatment well Patient left: in bed;with call bell/phone within reach;with bed alarm set Nurse Communication: Mobility status PT Visit Diagnosis: Other abnormalities of gait and mobility (R26.89);Muscle weakness (generalized) (M62.81);Difficulty in walking, not elsewhere classified (R26.2);Pain     Time: 1219-7588 PT Time Calculation (min) (ACUTE ONLY): 17 min  Charges:  $Therapeutic Activity: 8-22 mins                     Wyona Almas, PT, DPT Acute Rehabilitation Services Office [phone]    Deno Etienne 03/31/2022, 9:54 AM

## 2022-03-31 NOTE — Progress Notes (Signed)
Calorie Count Note  48-hour calorie count ordered.  Diet: Soft Diet Supplements: Ensure Max High Protein TID, Whole milk and grape juice TID with meals  Estimated Nutritional Needs:  Kcal:  2400-2600 kcal/d Protein:  125-140 g/d  Fluid:  2.4-2.6 L/d  6/19 Breakfast: 37kcal, 2g protein 6/19 Lunch: 294 kcal, 10g protein 6/19 Dinner: 291kcal, 19g protein Supplements: 1 ensure max high protein (350kcal 20g protein)  Total intake: 972 kcal (41% of minimum estimated needs)  51 protein (41% of minimum estimated needs)  Intake+TPN at 68m/h = 2183kcal (91%), 116g (93%)  Talked with pt in room this AM. Did not order breakfast, but grape juice and whole milk were delivered. Gave pt his 10AM ensure. Encouraged pt to drink 3x/d and to ask RN to bring whenever he was ready for one. Also discussed with RN and the confusion pt had over the ensure that was ordered for him. Pt reports that his intake is improving, but after we reviewed his intake over the last 24 hours, noted he did not order a meal this AM or yesterday AM. Will change to ordering assist to ensure that all meals are ordered. Pt opened his whole milk and was drinking it while in the room this AM. Again emphasized the importance of keeping his nutrition adequate in order for his wounds to heal.   If pt does not increase his intake over the course of the day, would not recommend decreasing TPN further as pt is meeting ~90% of nutrition needs with the two forms of nutrition combined. Needs to have more consistent intake prior to further weaning.  NUTRITION DIAGNOSIS:  Severe Malnutrition related to chronic illness (metastatic colon cancer) as evidenced by severe fat depletion, severe muscle depletion. - remains applicable   GOAL:  Patient will meet greater than or equal to 90% of their needs - being addressed with TPN and diet  Intervention:  Continue current diet as ordered, encourage PO intake Will change to ordering assist to  increase tray acceptance Offer Ensure Plus High Protein po TID, each supplement provides 350 kcal and 20 grams of protein. Continue TPN at 50% rate Continue calorie count for 48 hours to evaluate adequacy of oral intake for appropriate dosing of TPN.  RRanell Patrick RD, LDN Clinical Dietitian RD pager # available in [place] After hours/weekend pager # [facility]

## 2022-03-31 NOTE — Progress Notes (Signed)
8 Days Post-Op  Subjective: No acute changes. Remains afebrile, repeat blood cultures pending. Declined PT yesterday. Patient reports his appetite is improving but per calorie count he is only eating very small portions of his meals. Passing flatus but did not have a BM yesterday.   Objective: Vital signs in last 24 hours: Temp:  [98.1 F (36.7 C)-98.7 F (37.1 C)] 98.2 F (36.8 C) (06/20 0404) Pulse Rate:  [95-102] 100 (06/20 0404) Resp:  [16-20] 18 (06/20 0404) BP: (119-131)/(79-90) 131/88 (06/20 0404) SpO2:  [95 %-98 %] 96 % (06/20 0404) Last BM Date : 03/27/22  Intake/Output from previous day: 06/19 0701 - 06/20 0700 In: -  Out: 1300 [Urine:1300] Intake/Output this shift: No intake/output data recorded.  PE: General: resting comfortably, NAD Neuro: alert and oriented, no focal deficits Resp: normal work of breathing on nasal cannula Abdomen: soft, nondistended, nontender to palpation. RLQ prior stoma site is granulating. Midline is open, wound base is clean with vicryl mesh in tact. No surrounding skin erythema or induration. Extremities: warm and well-perfused   Lab Results:  Recent Labs    03/29/22 0332  WBC 10.2  HGB 8.2*  HCT 25.7*  PLT 318   BMET Recent Labs    03/29/22 0332 03/30/22 0349  NA 131* 131*  K 4.5 4.3  CL 101 100  CO2 22 22  GLUCOSE 129* 126*  BUN 16 15  CREATININE 0.62 0.64  CALCIUM 8.3* 8.3*   PT/INR No results for input(s): "LABPROT", "INR" in the last 72 hours. CMP     Component Value Date/Time   NA 131 (L) 03/30/2022 0349   K 4.3 03/30/2022 0349   CL 100 03/30/2022 0349   CO2 22 03/30/2022 0349   GLUCOSE 126 (H) 03/30/2022 0349   BUN 15 03/30/2022 0349   CREATININE 0.64 03/30/2022 0349   CREATININE 1.18 02/20/2022 1117   CALCIUM 8.3 (L) 03/30/2022 0349   PROT 6.7 03/30/2022 0349   ALBUMIN 2.2 (L) 03/30/2022 0349   AST 40 03/30/2022 0349   AST 18 02/20/2022 1117   ALT 78 (H) 03/30/2022 0349   ALT 12 02/20/2022  1117   ALKPHOS 239 (H) 03/30/2022 0349   BILITOT 0.7 03/30/2022 0349   BILITOT 0.6 02/20/2022 1117   GFRNONAA >60 03/30/2022 0349   GFRNONAA >60 02/20/2022 1117   Lipase  No results found for: "LIPASE"     Studies/Results: No results found.     Assessment/Plan 51 yo male with stage IV colon cancer. POD18 s/p hepatic wedge resection and ileostomy reversal on 6/2. POD8 s/p takeback for fascial dehiscence with placement of vicryl mesh bridge 6/12 (Dr. [name]).  - FEN: Continue soft diet, TPN. Encouraged patient to drink protein shakes and increase PO intake. - Wound care: Mepitel to base of midline wound, cover with saline WTD and change BID. Saline WTD to prior ostomy site. Abdominal binder when out of bed. - ID: Bacteremia (Enterococcus avium). Abx changed to Unasyn per ID. Repeat blood cultures pending. If patient does not clear bacteremia will need to consider draining fluid noted at site of liver resection on CT (although fluid accumulation is expected and does not necessarily indicate infection). - Pain control: continue scheduled tylenol and po robaxin. Continue opiate wean - decrease oxycodone today. - Ambulate TID. I emphasized with the patient today the need to increase his mobility and work more with PT. - COPD: Home Breo ellipta, prn albuterol. Pulmonary toilet.  - VTE: lovenox, SCDs - Dispo: inpatient, will need  home health nursing at discharge for wound care.   LOS: 18 days    Michaelle Birks, MD [facility] Surgery General, Hepatobiliary and Pancreatic Surgery 03/31/22 6:23 AM

## 2022-04-01 DIAGNOSIS — R7881 Bacteremia: Secondary | ICD-10-CM

## 2022-04-01 DIAGNOSIS — T80212A Local infection due to central venous catheter, initial encounter: Secondary | ICD-10-CM

## 2022-04-01 LAB — GLUCOSE, CAPILLARY
Glucose-Capillary: 116 mg/dL — ABNORMAL HIGH (ref 70–99)
Glucose-Capillary: 128 mg/dL — ABNORMAL HIGH (ref 70–99)
Glucose-Capillary: 119 mg/dL — ABNORMAL HIGH (ref 70–99)
Glucose-Capillary: 108 mg/dL — ABNORMAL HIGH (ref 70–99)

## 2022-04-01 MED ORDER — MELATONIN 3 MG PO TABS
3.0000 mg | ORAL_TABLET | Freq: Every day | ORAL | Status: DC
  Administered 2022-04-01 – 2022-04-04 (×4): 3 mg via ORAL
  Filled 2022-04-01 (×4): qty 1

## 2022-04-01 MED ORDER — HYDROMORPHONE HCL 1 MG/ML IJ SOLN
0.5000 mg | INTRAMUSCULAR | Status: DC | PRN
  Administered 2022-04-01 – 2022-04-03 (×10): 0.5 mg via INTRAVENOUS
  Filled 2022-04-01 (×10): qty 0.5

## 2022-04-01 NOTE — Progress Notes (Signed)
9 Days Post-Op  Subjective: No calorie count recorded last 24 hours. Patient ate 100% of breakfast tray this morning, and reports that he ate a whole cup of mashed potatoes and 1 protein shake yesterday. Passing flatus but no BM. Repeat blood cultures with GPCs in 1 culture, awaiting speciation. Ambulated in hall yesterday.   Objective: Vital signs in last 24 hours: Temp:  [98.1 F (36.7 C)-98.6 F (37 C)] 98.2 F (36.8 C) (06/21 0729) Pulse Rate:  [82-102] 90 (06/21 0729) Resp:  [17-20] 17 (06/21 0729) BP: (119-142)/(83-95) 133/90 (06/21 0729) SpO2:  [94 %-98 %] 94 % (06/21 0820) Last BM Date : 03/27/22  Intake/Output from previous day: 06/20 0701 - 06/21 0700 In: -  Out: 2030 [Urine:2030] Intake/Output this shift: Total I/O In: -  Out: 850 [Urine:850]  PE: General: resting comfortably, NAD Neuro: alert and oriented, no focal deficits Resp: normal work of breathing on nasal cannula Abdomen: soft, nondistended, nontender to palpation. RLQ prior stoma site is granulating. Midline is open, wound base has fibrinous exudate but no purulent drainage. Vicryl mesh in tact. No surrounding skin erythema or induration. Extremities: warm and well-perfused   Lab Results:  No results for input(s): "WBC", "HGB", "HCT", "PLT" in the last 72 hours.  BMET Recent Labs    03/30/22 0349  NA 131*  K 4.3  CL 100  CO2 22  GLUCOSE 126*  BUN 15  CREATININE 0.64  CALCIUM 8.3*   PT/INR No results for input(s): "LABPROT", "INR" in the last 72 hours. CMP     Component Value Date/Time   NA 131 (L) 03/30/2022 0349   K 4.3 03/30/2022 0349   CL 100 03/30/2022 0349   CO2 22 03/30/2022 0349   GLUCOSE 126 (H) 03/30/2022 0349   BUN 15 03/30/2022 0349   CREATININE 0.64 03/30/2022 0349   CREATININE 1.18 02/20/2022 1117   CALCIUM 8.3 (L) 03/30/2022 0349   PROT 6.7 03/30/2022 0349   ALBUMIN 2.2 (L) 03/30/2022 0349   AST 40 03/30/2022 0349   AST 18 02/20/2022 1117   ALT 78 (H)  03/30/2022 0349   ALT 12 02/20/2022 1117   ALKPHOS 239 (H) 03/30/2022 0349   BILITOT 0.7 03/30/2022 0349   BILITOT 0.6 02/20/2022 1117   GFRNONAA >60 03/30/2022 0349   GFRNONAA >60 02/20/2022 1117   Lipase  No results found for: "LIPASE"     Studies/Results: ECHOCARDIOGRAM LIMITED  Result Date: 03/31/2022    ECHOCARDIOGRAM LIMITED REPORT   Patient Name:   Eric [name] Date of Exam: 03/31/2022 Medical Rec #:  [id number]    Height:       75.0 in Accession #:    [id number]   Weight:       170.2 lb Date of Birth:  Feb 27, 1971    BSA:          2.049 m Patient Age:    51 years     BP:           129/90 mmHg Patient Gender: M            HR:           92 bpm. Exam Location:  Inpatient Procedure: Limited Echo, Cardiac Doppler and Color Doppler Indications:    Bacteremia.  History:        Patient has prior history of Echocardiogram examinations, most                 recent 09/23/2021. Signs/Symptoms:Bacteremia, Shortness of  Breath and Dyspnea; Risk Factors:Current Smoker. Metastatic                 colon and liver cancer.  Sonographer:    Roseanna Rainbow RDCS Referring Phys: [place]  Sonographer Comments: Technically difficult study due to poor echo windows. Patient had recent abdominal surgery. Limited echo for bacteremia. Patient had abdominal binder and surgical bandage. IMPRESSIONS  1. Left ventricular ejection fraction, by estimation, is 60 to 65%. The left ventricle has normal function. There is mild concentric left ventricular hypertrophy. Left ventricular diastolic parameters are consistent with Grade I diastolic dysfunction (impaired relaxation).  2. Large pleural effusion in the right lateral region.  3. The mitral valve is abnormally thickened. Mild mitral valve regurgitation.  4. The aortic valve was not well visualized.  5. The inferior vena cava is normal in size with greater than 50% respiratory variability, suggesting right atrial pressure of 3 mmHg. Comparison(s): No  significant change from prior study. Prior study limited (subcostal) and does not show mitral valve. Tehcnically difficult studies. Conclusion(s)/Recommendation(s): No evidence of valvular vegetations on this transthoracic echocardiogram. Consider a transesophageal echocardiogram to exclude infective endocarditis if clinically indicated. FINDINGS  Left Ventricle: Left ventricular ejection fraction, by estimation, is 60 to 65%. The left ventricle has normal function. The left ventricular internal cavity size was small. There is mild concentric left ventricular hypertrophy. Left ventricular diastolic parameters are consistent with Grade I diastolic dysfunction (impaired relaxation). Mitral Valve: The mitral valve is abnormal. Mild mitral valve regurgitation. Aortic Valve: The aortic valve was not well visualized. Pulmonic Valve: The pulmonic valve was not well visualized. Pulmonic valve regurgitation is not visualized. Venous: The inferior vena cava is normal in size with greater than 50% respiratory variability, suggesting right atrial pressure of 3 mmHg. IAS/Shunts: No atrial level shunt detected by color flow Doppler. Additional Comments: A venous catheter is visualized. There is a large pleural effusion in the right lateral region. LEFT VENTRICLE PLAX 2D LVIDd:         3.00 cm LVIDs:         2.10 cm LV PW:         1.30 cm LV IVS:        1.20 cm LVOT diam:     2.30 cm LVOT Area:     4.15 cm  LEFT ATRIUM         Index LA diam:    3.10 cm 1.51 cm/m   AORTA Ao Root diam: 3.10 cm Ao Asc diam:  3.70 cm MITRAL VALVE MV Area (PHT): 3.77 cm    SHUNTS MV Decel Time: 201 msec    Systemic Diam: 2.30 cm MV E velocity: 63.40 cm/s MV A velocity: 75.40 cm/s MV E/A ratio:  0.84 Rudean Haskell MD Electronically signed by Rudean Haskell MD Signature Date/Time: 03/31/2022/2:09:02 PM    Final        Assessment/Plan 51 yo male with stage IV colon cancer. POD19 s/p hepatic wedge resection and ileostomy reversal on  6/2. POD9 s/p takeback for fascial dehiscence with placement of vicryl mesh bridge 6/12 (Dr. [name]).  - FEN: Continue soft diet, PO intake slowly improving. Stop TPN after today's bag runs out. Given bacteremia, remove PICC once TPN is off. If PO intake remains poor, will place cortrak now that ileus has resolved. - Wound care: Mepitel to base of midline wound, cover with saline WTD and change BID. Saline WTD to prior ostomy site. Abdominal binder when out of bed. -  ID: Bacteremia (Enterococcus avium). On Unasyn, appreciate ID recs. Echo yesterday shows no vegetations. Repeat blood cultures pending. Remove PICC once TPN stops. Patient also has portacath in place, will need to consider removal if bacteremia does not clear. - Pain control: continue scheduled tylenol and po robaxin. Continue opiate wean. - Ambulate TID.  - COPD: Home Breo ellipta, prn albuterol. Pulmonary toilet.  - VTE: lovenox, SCDs - Dispo: inpatient, will need home health nursing at discharge for wound care.   LOS: 19 days    Michaelle Birks, MD [facility] Surgery General, Hepatobiliary and Pancreatic Surgery 04/01/22 8:37 AM

## 2022-04-01 NOTE — Progress Notes (Signed)
PHARMACY - PHYSICIAN COMMUNICATION CRITICAL VALUE ALERT - BLOOD CULTURE IDENTIFICATION (BCID)  Eric [name] is an 51 y.o. male with colon cancer s/p liver section, post-op ileus on TPN.  Blood cultures previously grown Enterococcus avium.  Assessment:  Anaerobic bottle of blood culture drawn 6/16 now growing Gram negative rods, not identifiable on BCID.  Pt has remained afebrile for last several days.    Name of physician (or Provider) Contacted:  Dr. [name]  Current antibiotics:  Unasyn  Changes to prescribed antibiotics recommended:   None at this time.  Unasyn will provide good anaerobic GN coverage.  F/u cultures and ID recommendations later today.    Results for orders placed or performed during the hospital encounter of 03/13/22  Blood Culture ID Panel (Reflexed) (Collected: 03/27/2022 10:59 AM)  Result Value Ref Range   Enterococcus faecalis NOT DETECTED NOT DETECTED   Enterococcus Faecium NOT DETECTED NOT DETECTED   Listeria monocytogenes NOT DETECTED NOT DETECTED   Staphylococcus species NOT DETECTED NOT DETECTED   Staphylococcus aureus (BCID) NOT DETECTED NOT DETECTED   Staphylococcus epidermidis NOT DETECTED NOT DETECTED   Staphylococcus lugdunensis NOT DETECTED NOT DETECTED   Streptococcus species NOT DETECTED NOT DETECTED   Streptococcus agalactiae NOT DETECTED NOT DETECTED   Streptococcus pneumoniae NOT DETECTED NOT DETECTED   Streptococcus pyogenes NOT DETECTED NOT DETECTED   A.calcoaceticus-baumannii NOT DETECTED NOT DETECTED   Bacteroides fragilis NOT DETECTED NOT DETECTED   Enterobacterales NOT DETECTED NOT DETECTED   Enterobacter cloacae complex NOT DETECTED NOT DETECTED   Escherichia coli NOT DETECTED NOT DETECTED   Klebsiella aerogenes NOT DETECTED NOT DETECTED   Klebsiella oxytoca NOT DETECTED NOT DETECTED   Klebsiella pneumoniae NOT DETECTED NOT DETECTED   Proteus species NOT DETECTED NOT DETECTED   Salmonella species NOT DETECTED NOT DETECTED   Serratia  marcescens NOT DETECTED NOT DETECTED   Haemophilus influenzae NOT DETECTED NOT DETECTED   Neisseria meningitidis NOT DETECTED NOT DETECTED   Pseudomonas aeruginosa NOT DETECTED NOT DETECTED   Stenotrophomonas maltophilia NOT DETECTED NOT DETECTED   Candida albicans NOT DETECTED NOT DETECTED   Candida auris NOT DETECTED NOT DETECTED   Candida glabrata NOT DETECTED NOT DETECTED   Candida krusei NOT DETECTED NOT DETECTED   Candida parapsilosis NOT DETECTED NOT DETECTED   Candida tropicalis NOT DETECTED NOT DETECTED   Cryptococcus neoformans/gattii NOT DETECTED NOT DETECTED    [name] 04/01/2022  3:48 AM

## 2022-04-01 NOTE — Progress Notes (Signed)
Calorie Count Note  48-hour calorie count ordered.  Diet: Soft Diet Supplements: Ensure Max High Protein TID, Whole milk and grape juice TID with meals  Estimated Nutritional Needs:  Kcal:  2400-2600 kcal/d Protein:  125-140 g/d  Fluid:  2.4-2.6 L/d  6/20 Breakfast: 114kcal, 4g protein 6/20 Lunch: 335 kcal, 15g protein 6/20 Dinner: 269 kcal, 12g protein Supplements: 350, 20g of protein  6/21 Breakfast: 305 kcal, 12g of protein  Total intake 6/20: 1068 kcal (45% of minimum estimated needs)  51 protein (41% of minimum estimated needs)  No meal tickets saved from yesterday. Reviewed 6/20's meals with patient this AM. Intake is still poor and pt is meeting <50% of his needs orally. Pt continues to only consume 1 ensure each day. States that he knows he should be drinking more but that he just isn't feeling up to it. Noted that TPN is to be discontinued tonight per surgery. If intake does not improve, can have NGT placed for nutrition. Discussed with attending and RN.  Encouraged pt to increase his PO intake and to drink his ensure supplements.   NUTRITION DIAGNOSIS:  Severe Malnutrition related to chronic illness (metastatic colon cancer) as evidenced by severe fat depletion, severe muscle depletion. - remains applicable   GOAL:  Patient will meet greater than or equal to 90% of their needs - being addressed with TPN and diet  Intervention:  Continue current diet as ordered, encourage PO intake Offer Ensure Plus High Protein po TID, each supplement provides 350 kcal and 20 grams of protein. Pt is not meeting his needs orally, will dc kcal count but continue to monitor intake. If intake does not improve, will recommend cortrak placement on Friday.  Ranell Patrick, RD, LDN Clinical Dietitian RD pager # available in [place]  After hours/weekend pager # available in [facility]

## 2022-04-01 NOTE — Progress Notes (Signed)
PHARMACY - TOTAL PARENTERAL NUTRITION CONSULT NOTE  Indication: Prolonged ileus  Patient Measurements: Height: 6\' 3"  (190.5 cm) Weight: 77.2 kg (170 lb 3.1 oz) IBW/kg (Calculated) : 84.5 TPN AdjBW (KG): 69.4 Body mass index is 21.27 kg/m. Usual Weight: 165 lb  Assessment:  83 yom with colon cancer with metastases to the liver. He underwent exlap LoA, cholecystectomy, wedge resection of liver lesions x2, and end ileostomy takedown on 6/2. Post-op he was put on clear liquids and was advanced to soft diet on 6/5 but had emesis that night. He is having loose bowel movements and passing flatus but post-op ileus suspected. NG tube was placed 6/7 and output continues to be high. Pharmacy consulted to dose TPN.   Per surgery note, patient reports having bowel movements. PO intake seems to be slowly improving. Patient confirmed able to tolerate solid foods (chicken/mashed potatoes/rice) 6/18 and 6/19 with no nausea/vomiting but only small percentages of each meal. Now able to eat full breakfast this morning. Will complete TPN after current bag finishes per discussion with surgery team.   Glucose / Insulin: no hx, A1c 5.8 - CBGs <150, Used 4 units SSI last 24hr Electrolytes: Na 131, K 4.3, CoCa 9.7, others WNL Renal: Scr 0.64, BUN WNL Hepatic: AST wnl, ALT up 78, Tbili WNL, Alk phos down 239, TG 96, albumin 2.2 Intake / Output; MIVF: UOP up 1875 mL (charting improved), LBM charted 6/16  GI Imaging: 6/7 KUB: post-op ileus 6/8 CT A/P: no abscess, possible adynamic ileus 6/16 KUB: mild ileus 6/16 CT Abd: stable postop ileus  GI Surgeries / Procedures:  6/2 ileostomy reversal, cholecystectomy, and partial hepatectomy 6/12 ex lap, mesh for abd dehiscence, partial abd closure  Central access: double lumen PICC on 03/20/22 TPN start date: 03/20/22  Nutritional Goals:  RD Estimated Needs Total Energy Estimated Needs: 2400-2600 kcal/d Total Protein Estimated Needs: 125-140 g/d Total Fluid Estimated  Needs: 2.4-2.6 L/d  Current Nutrition:  TPN   soft diet  Plan:  Stop TPN after current bag completes (ends today at 17:59), currently at 50 mL/hr (1/2 rate) to provide 65g AA and 1211 kCal, meeting 50% of needs Electrolytes in TPN: Na 150 mEq/L; K 30 mEq/L, Phos 10 mmol/L; Ca 48mEq/L, Mg 4 mEq/L, Cl:Ac 1:2 (all lytes to decrease with rate decrease) Standard MVI and trace elements to PO Continue sensitive SSI TID with meals  Monitor standard TPN labs on Mon/Thurs  F/U PO intake/diet advancement    Thank you for allowing pharmacy to be a part of this patient's care.  Ardyth Harps, PharmD Clinical Pharmacist

## 2022-04-01 NOTE — Progress Notes (Signed)
[facility] for Infectious Disease   Reason for visit: Follow up on bacteremia  Interval History: now new growth with anaerobic GNR in one bottle from initial blood cultures; GPC in one bottle of new set still; TTE without vegetation Day 3 ampicillin/sulbactam Day 5 total antibiotics  Physical Exam: Constitutional:  Vitals:   04/01/22 0729 04/01/22 0820  BP: 133/90   Pulse: 90   Resp: 17   Temp: 98.2 F (36.8 C)   SpO2: 94% 94%   patient appears in NAD Respiratory: Normal respiratory effort GI: soft Chest: port in right chest  Review of Systems: Constitutional: negative for fevers and chills  Lab Results  Component Value Date   WBC 10.2 03/29/2022   HGB 8.2 (L) 03/29/2022   HCT 25.7 (L) 03/29/2022   MCV 90.8 03/29/2022   PLT 318 03/29/2022    Lab Results  Component Value Date   CREATININE 0.64 03/30/2022   BUN 15 03/30/2022   NA 131 (L) 03/30/2022   K 4.3 03/30/2022   CL 100 03/30/2022   CO2 22 03/30/2022    Lab Results  Component Value Date   ALT 78 (H) 03/30/2022   AST 40 03/30/2022   ALKPHOS 239 (H) 03/30/2022     Microbiology: Recent Results (from the past 240 hour(s))  Culture, blood (Routine X 2) w Reflex to ID Panel     Status: Abnormal   Collection Time: 03/27/22 10:59 AM   Specimen: BLOOD  Result Value Ref Range Status   Specimen Description BLOOD LEFT ANTECUBITAL  Final   Special Requests   Final    BOTTLES DRAWN AEROBIC AND ANAEROBIC Blood Culture adequate volume   Culture  Setup Time   Final    GRAM POSITIVE COCCI IN CHAINS IN BOTH AEROBIC AND ANAEROBIC BOTTLES CRITICAL RESULT CALLED TO, READ BACK BY AND VERIFIED WITH: Jacklynn Barnacle [id number] AT 1218 BY CM Performed at [facility], [place] [address] [place], [place] [address]    Culture ENTEROCOCCUS AVIUM (A)  Final   Report Status 03/30/2022 FINAL  Final   Organism ID, Bacteria ENTEROCOCCUS AVIUM  Final      Susceptibility   Enterococcus avium - MIC*    AMPICILLIN <=2  SENSITIVE Sensitive     VANCOMYCIN <=0.5 SENSITIVE Sensitive     GENTAMICIN SYNERGY SENSITIVE Sensitive     * ENTEROCOCCUS AVIUM  Culture, blood (Routine X 2) w Reflex to ID Panel     Status: None (Preliminary result)   Collection Time: 03/27/22 10:59 AM   Specimen: BLOOD LEFT HAND  Result Value Ref Range Status   Specimen Description BLOOD LEFT HAND  Final   Special Requests   Final    BOTTLES DRAWN AEROBIC AND ANAEROBIC Blood Culture results may not be optimal due to an inadequate volume of blood received in culture bottles   Culture  Setup Time   Final    GRAM NEGATIVE RODS ANAEROBIC BOTTLE ONLY CRITICAL RESULT CALLED TO, READ BACK BY AND VERIFIED WITH: PHARMD GREG ABBOTT 04/01/22'@3'$ :48 BY TW Performed at [facility], [place] [address], [place], [place] [address]    Culture GRAM NEGATIVE RODS  Final   Report Status PENDING  Incomplete  Blood Culture ID Panel (Reflexed)     Status: None   Collection Time: 03/27/22 10:59 AM  Result Value Ref Range Status   Enterococcus faecalis NOT DETECTED NOT DETECTED Final   Enterococcus Faecium NOT DETECTED NOT DETECTED Final   Listeria monocytogenes NOT  DETECTED NOT DETECTED Final   Staphylococcus species NOT DETECTED NOT DETECTED Final   Staphylococcus aureus (BCID) NOT DETECTED NOT DETECTED Final   Staphylococcus epidermidis NOT DETECTED NOT DETECTED Final   Staphylococcus lugdunensis NOT DETECTED NOT DETECTED Final   Streptococcus species NOT DETECTED NOT DETECTED Final   Streptococcus agalactiae NOT DETECTED NOT DETECTED Final   Streptococcus pneumoniae NOT DETECTED NOT DETECTED Final   Streptococcus pyogenes NOT DETECTED NOT DETECTED Final   A.calcoaceticus-baumannii NOT DETECTED NOT DETECTED Final   Bacteroides fragilis NOT DETECTED NOT DETECTED Final   Enterobacterales NOT DETECTED NOT DETECTED Final   Enterobacter cloacae complex NOT DETECTED NOT DETECTED Final   Escherichia coli NOT DETECTED NOT DETECTED Final   Klebsiella  aerogenes NOT DETECTED NOT DETECTED Final   Klebsiella oxytoca NOT DETECTED NOT DETECTED Final   Klebsiella pneumoniae NOT DETECTED NOT DETECTED Final   Proteus species NOT DETECTED NOT DETECTED Final   Salmonella species NOT DETECTED NOT DETECTED Final   Serratia marcescens NOT DETECTED NOT DETECTED Final   Haemophilus influenzae NOT DETECTED NOT DETECTED Final   Neisseria meningitidis NOT DETECTED NOT DETECTED Final   Pseudomonas aeruginosa NOT DETECTED NOT DETECTED Final   Stenotrophomonas maltophilia NOT DETECTED NOT DETECTED Final   Candida albicans NOT DETECTED NOT DETECTED Final   Candida auris NOT DETECTED NOT DETECTED Final   Candida glabrata NOT DETECTED NOT DETECTED Final   Candida krusei NOT DETECTED NOT DETECTED Final   Candida parapsilosis NOT DETECTED NOT DETECTED Final   Candida tropicalis NOT DETECTED NOT DETECTED Final   Cryptococcus neoformans/gattii NOT DETECTED NOT DETECTED Final    Comment: Performed at [facility], [address], [place], [place] [address]  Culture, blood (Routine X 2) w Reflex to ID Panel     Status: None (Preliminary result)   Collection Time: 03/30/22  1:24 PM   Specimen: BLOOD  Result Value Ref Range Status   Specimen Description BLOOD LEFT ANTECUBITAL  Final   Special Requests   Final    BOTTLES DRAWN AEROBIC AND ANAEROBIC Blood Culture adequate volume   Culture  Setup Time   Final    GRAM POSITIVE COCCI IN CHAINS ANAEROBIC BOTTLE ONLY CRITICAL VALUE NOTED.  VALUE IS CONSISTENT WITH PREVIOUSLY REPORTED AND CALLED VALUE.    Culture   Final    GRAM POSITIVE COCCI IDENTIFICATION TO FOLLOW Performed at [facility], [place] [address], [place], [place] [address]    Report Status PENDING  Incomplete  Culture, blood (Routine X 2) w Reflex to ID Panel     Status: None (Preliminary result)   Collection Time: 03/30/22  1:24 PM   Specimen: BLOOD LEFT FOREARM  Result Value Ref Range Status   Specimen Description BLOOD LEFT FOREARM   Final   Special Requests   Final    BOTTLES DRAWN AEROBIC AND ANAEROBIC Blood Culture adequate volume   Culture   Final    NO GROWTH 2 DAYS Performed at [facility], [place] [address], [place],  [address]    Report Status PENDING  Incomplete    Impression/Plan:  1. Bacteremia - Enterococcus avium and now with a GNR in one bottle.  Consistent with a GI source.  On ampicillin/sulbactam and will continue with this.  Repeat culture again with GPC, unclear if it is the same Enterococcus but likely is based on gram stain/GPC in chains.   Will continue to monitor, continue with ampicillin/sulbactam  2.  Nutrition - on TPN but  weaning off and doing well then can get PICC line out soon.    3.  Port-a-cath - in place still and I agree, it will be ideal to have this removed due to the bacteremia.  I will repeat the blood cultures again today to see if there is persistent growth.

## 2022-04-01 NOTE — Progress Notes (Signed)
Physical Therapy Treatment Patient Details Name: Eric [name] MRN: [id number] DOB: [birth year] Today's Date: 04/01/2022   History of Present Illness Pt is 51 yr old M admitted on 03/13/22 for planned ileostomy reversal, wedge resection of liver x 2 and cholecystectomy.  Had second explratory lap 03/23/22 due to wound dehiscence. Vicryl mesh application. PMH: Colon CA, COPD    PT Comments    Pt progressing steadily towards his physical therapy goals; exhibiting improved activity tolerance and ambulation distance this session. Pt ambulating 150 ft with a walker at a supervision level; SpO2 91-98% on 1L O2. Will decrease frequency to 2x/wk to continue to check in on; encouraged continued daily ambulation with mobility specialist.   Recommendations for follow up therapy are one component of a multi-disciplinary discharge planning process, led by the attending physician.  Recommendations may be updated based on patient status, additional functional criteria and insurance authorization.  Follow Up Recommendations  Home health PT     Assistance Recommended at Discharge PRN  Patient can return home with the following Help with stairs or ramp for entrance;Assist for transportation;A little help with walking and/or transfers;A little help with bathing/dressing/bathroom;Assistance with cooking/housework   Equipment Recommendations  None recommended by PT    Recommendations for Other Services       Precautions / Restrictions Precautions Precautions: Fall Restrictions Weight Bearing Restrictions: No Other Position/Activity Restrictions: Abdominal binder OOB     Mobility  Bed Mobility Overal bed mobility: Modified Independent             General bed mobility comments: HOB elevated    Transfers Overall transfer level: Modified independent Equipment used: Rolling walker (2 wheels)                    Ambulation/Gait Ambulation/Gait assistance: Supervision Gait Distance  (Feet): 150 Feet Assistive device: Rolling walker (2 wheels) Gait Pattern/deviations: Step-through pattern, Decreased stride length Gait velocity: decreased     General Gait Details: Supervision for safety   Stairs             Wheelchair Mobility    Modified Rankin (Stroke Patients Only)       Balance Overall balance assessment: Needs assistance Sitting-balance support: Feet supported Sitting balance-Leahy Scale: Good     Standing balance support: Bilateral upper extremity supported, During functional activity, Reliant on assistive device for balance Standing balance-Leahy Scale: Fair                              Cognition Arousal/Alertness: Awake/alert Behavior During Therapy: WFL for tasks assessed/performed Overall Cognitive Status: Within Functional Limits for tasks assessed                                          Exercises      General Comments        Pertinent Vitals/Pain Pain Assessment Pain Assessment: Faces Faces Pain Scale: Hurts little more Pain Location: abdomen Pain Descriptors / Indicators: Grimacing, Guarding Pain Intervention(s): Monitored during session    Home Living                          Prior Function            PT Goals (current goals can now be found in the care plan section) Acute Rehab PT  Goals Patient Stated Goal: to return home with brother Time For Goal Achievement: 04/14/22 Potential to Achieve Goals: Good Progress towards PT goals: Progressing toward goals    Frequency    Min 2X/week      PT Plan Current plan remains appropriate    Co-evaluation              AM-PAC PT "6 Clicks" Mobility   Outcome Measure  Help needed turning from your back to your side while in a flat bed without using bedrails?: None Help needed moving from lying on your back to sitting on the side of a flat bed without using bedrails?: None Help needed moving to and from a bed to a  chair (including a wheelchair)?: A Little Help needed standing up from a chair using your arms (e.g., wheelchair or bedside chair)?: A Little Help needed to walk in hospital room?: A Little Help needed climbing 3-5 steps with a railing? : A Lot 6 Click Score: 19    End of Session Equipment Utilized During Treatment: Oxygen Activity Tolerance: Patient tolerated treatment well Patient left: in bed;with call bell/phone within reach;with bed alarm set Nurse Communication: Mobility status PT Visit Diagnosis: Other abnormalities of gait and mobility (R26.89);Muscle weakness (generalized) (M62.81);Difficulty in walking, not elsewhere classified (R26.2);Pain     Time: 2563-8937 PT Time Calculation (min) (ACUTE ONLY): 17 min  Charges:  $Therapeutic Activity: 8-22 mins                     Wyona Almas, PT, DPT Acute Rehabilitation Services Office [phone]    Deno Etienne 04/01/2022, 12:21 PM

## 2022-04-02 ENCOUNTER — Ambulatory Visit: Payer: 59 | Admitting: Nurse Practitioner

## 2022-04-02 DIAGNOSIS — R7881 Bacteremia: Secondary | ICD-10-CM | POA: Diagnosis not present

## 2022-04-02 DIAGNOSIS — T80212A Local infection due to central venous catheter, initial encounter: Secondary | ICD-10-CM | POA: Diagnosis not present

## 2022-04-02 LAB — CBC
HCT: 30.3 % — ABNORMAL LOW (ref 39.0–52.0)
Hemoglobin: 9 g/dL — ABNORMAL LOW (ref 13.0–17.0)
MCH: 27.7 pg (ref 26.0–34.0)
MCHC: 29.7 g/dL — ABNORMAL LOW (ref 30.0–36.0)
MCV: 93.2 fL (ref 80.0–100.0)
Platelets: 354 10*3/uL (ref 150–400)
RBC: 3.25 MIL/uL — ABNORMAL LOW (ref 4.22–5.81)
RDW: 18.1 % — ABNORMAL HIGH (ref 11.5–15.5)
WBC: 9.7 10*3/uL (ref 4.0–10.5)
nRBC: 0 % (ref 0.0–0.2)

## 2022-04-02 LAB — PHOSPHORUS: Phosphorus: 4.3 mg/dL (ref 2.5–4.6)

## 2022-04-02 LAB — COMPREHENSIVE METABOLIC PANEL
ALT: 63 U/L — ABNORMAL HIGH (ref 0–44)
AST: 42 U/L — ABNORMAL HIGH (ref 15–41)
Albumin: 2.2 g/dL — ABNORMAL LOW (ref 3.5–5.0)
Alkaline Phosphatase: 285 U/L — ABNORMAL HIGH (ref 38–126)
Anion gap: 9 (ref 5–15)
BUN: 18 mg/dL (ref 6–20)
CO2: 23 mmol/L (ref 22–32)
Calcium: 8.7 mg/dL — ABNORMAL LOW (ref 8.9–10.3)
Chloride: 101 mmol/L (ref 98–111)
Creatinine, Ser: 0.74 mg/dL (ref 0.61–1.24)
GFR, Estimated: >60 mL/min (ref >60)
Glucose, Bld: 110 mg/dL — ABNORMAL HIGH (ref 70–99)
Potassium: 4.5 mmol/L (ref 3.5–5.1)
Sodium: 133 mmol/L — ABNORMAL LOW (ref 135–145)
Total Bilirubin: 0.5 mg/dL (ref 0.3–1.2)
Total Protein: 6.8 g/dL (ref 6.5–8.1)

## 2022-04-02 LAB — GLUCOSE, CAPILLARY
Glucose-Capillary: 112 mg/dL — ABNORMAL HIGH (ref 70–99)
Glucose-Capillary: 206 mg/dL — ABNORMAL HIGH (ref 70–99)
Glucose-Capillary: 107 mg/dL — ABNORMAL HIGH (ref 70–99)
Glucose-Capillary: 136 mg/dL — ABNORMAL HIGH (ref 70–99)

## 2022-04-02 LAB — CULTURE, BLOOD (ROUTINE X 2): Special Requests: ADEQUATE

## 2022-04-02 LAB — TRIGLYCERIDES: Triglycerides: 103 mg/dL (ref <150)

## 2022-04-02 LAB — MAGNESIUM: Magnesium: 2.2 mg/dL (ref 1.7–2.4)

## 2022-04-02 NOTE — Plan of Care (Signed)
  Problem: Education: Goal: Knowledge of General Education information will improve Description: Including pain rating scale, medication(s)/side effects and non-pharmacologic comfort measures Outcome: Progressing   Problem: Health Behavior/Discharge Planning: Goal: Ability to manage health-related needs will improve Outcome: Progressing   Problem: Clinical Measurements: Goal: Ability to maintain clinical measurements within normal limits will improve Outcome: Progressing Goal: Will remain free from infection Outcome: Progressing Goal: Diagnostic test results will improve Outcome: Progressing Goal: Respiratory complications will improve Outcome: Progressing Goal: Cardiovascular complication will be avoided Outcome: Progressing   Problem: Activity: Goal: Risk for activity intolerance will decrease Outcome: Progressing   Problem: Nutrition: Goal: Adequate nutrition will be maintained Outcome: Progressing   Problem: Coping: Goal: Level of anxiety will decrease Outcome: Progressing   Problem: Elimination: Goal: Will not experience complications related to bowel motility Outcome: Progressing Goal: Will not experience complications related to urinary retention Outcome: Progressing   Problem: Safety: Goal: Ability to remain free from injury will improve Outcome: Progressing   Problem: Skin Integrity: Goal: Risk for impaired skin integrity will decrease Outcome: Progressing   Problem: Education: Goal: Ability to describe self-care measures that may prevent or decrease complications (Diabetes Survival Skills Education) will improve Outcome: Progressing Goal: Individualized Educational Video(s) Outcome: Progressing   Problem: Fluid Volume: Goal: Ability to maintain a balanced intake and output will improve Outcome: Progressing   Problem: Health Behavior/Discharge Planning: Goal: Ability to identify and utilize available resources and services will improve Outcome:  Progressing Goal: Ability to manage health-related needs will improve Outcome: Progressing   Problem: Skin Integrity: Goal: Risk for impaired skin integrity will decrease Outcome: Progressing   Problem: Tissue Perfusion: Goal: Adequacy of tissue perfusion will improve Outcome: Progressing

## 2022-04-02 NOTE — Progress Notes (Signed)
Mobility Specialist Progress Note:   04/02/22 0945  Mobility  Activity Transferred from chair to bed  Level of Assistance Standby assist, set-up cues, supervision of patient - no hands on  Assistive Device Front wheel walker  Distance Ambulated (ft) 4 ft  Activity Response Tolerated well  $Mobility charge 1 Mobility   Pt requesting to go back to bed. Tolerated ~39min sitting up in chair. Pt left in bed with all needs met, bed alarm on.   Nelta Numbers Acute Rehab Secure Chat or Office Phone: [phone]

## 2022-04-02 NOTE — Progress Notes (Signed)
[facility]   Reason for visit: Follow up on bacteremia  Interval History: GNR not yet speciated; E avium in repeat blood culture and new set sent yesterday. WBC 9.7 and remains afebrile.  No new complaints.    Day 4 ampicillin/sulbactam Day 6 total antibiotics  Physical Exam: Constitutional:  Vitals:   04/02/22 0747 04/02/22 0946  BP: (!) 124/91   Pulse: 91   Resp: 16   Temp: 98.7 F (37.1 C)   SpO2: 97% 99%   patient appears in NAD Respiratory: Normal respiratory effort Skin: no rashes  Review of Systems: Constitutional: negative for fevers and chills Gastrointestinal: negative for nausea  Lab Results  Component Value Date   WBC 9.7 04/02/2022   HGB 9.0 (L) 04/02/2022   HCT 30.3 (L) 04/02/2022   MCV 93.2 04/02/2022   PLT 354 04/02/2022    Lab Results  Component Value Date   CREATININE 0.74 04/02/2022   BUN 18 04/02/2022   NA 133 (L) 04/02/2022   K 4.5 04/02/2022   CL 101 04/02/2022   CO2 23 04/02/2022    Lab Results  Component Value Date   ALT 63 (H) 04/02/2022   AST 42 (H) 04/02/2022   ALKPHOS 285 (H) 04/02/2022     Microbiology: Recent Results (from the past 240 hour(s))  Culture, blood (Routine X 2) w Reflex to ID Panel     Status: Abnormal   Collection Time: 03/27/22 10:59 AM   Specimen: BLOOD  Result Value Ref Range Status   Specimen Description BLOOD LEFT ANTECUBITAL  Final   Special Requests   Final    BOTTLES DRAWN AEROBIC AND ANAEROBIC Blood Culture adequate volume   Culture  Setup Time   Final    GRAM POSITIVE COCCI IN CHAINS IN BOTH AEROBIC AND ANAEROBIC BOTTLES CRITICAL RESULT CALLED TO, READ BACK BY AND VERIFIED WITH: Jacklynn Barnacle [id number] AT 1218 BY CM Performed at [facility], [place] [address], [place], [place] [address]    Culture ENTEROCOCCUS AVIUM (A)  Final   Report Status 03/30/2022 FINAL  Final   Organism ID, Bacteria ENTEROCOCCUS AVIUM  Final      Susceptibility   Enterococcus avium -  MIC*    AMPICILLIN <=2 SENSITIVE Sensitive     VANCOMYCIN <=0.5 SENSITIVE Sensitive     GENTAMICIN SYNERGY SENSITIVE Sensitive     * ENTEROCOCCUS AVIUM  Culture, blood (Routine X 2) w Reflex to ID Panel     Status: None (Preliminary result)   Collection Time: 03/27/22 10:59 AM   Specimen: BLOOD LEFT HAND  Result Value Ref Range Status   Specimen Description BLOOD LEFT HAND  Final   Special Requests   Final    BOTTLES DRAWN AEROBIC AND ANAEROBIC Blood Culture results may not be optimal due to an inadequate volume of blood received in culture bottles   Culture  Setup Time   Final    GRAM NEGATIVE RODS ANAEROBIC BOTTLE ONLY CRITICAL RESULT CALLED TO, READ BACK BY AND VERIFIED WITH: PHARMD GREG ABBOTT 04/01/22'@3'$ :48 BY TW Performed at [facility], [place] [address] [place], [place] [address]    Culture GRAM NEGATIVE RODS  Final   Report Status PENDING  Incomplete  Blood Culture ID Panel (Reflexed)     Status: None   Collection Time: 03/27/22 10:59 AM  Result Value Ref Range Status   Enterococcus faecalis NOT DETECTED NOT DETECTED Final   Enterococcus Faecium NOT DETECTED NOT DETECTED Final  Listeria monocytogenes NOT DETECTED NOT DETECTED Final   Staphylococcus species NOT DETECTED NOT DETECTED Final   Staphylococcus aureus (BCID) NOT DETECTED NOT DETECTED Final   Staphylococcus epidermidis NOT DETECTED NOT DETECTED Final   Staphylococcus lugdunensis NOT DETECTED NOT DETECTED Final   Streptococcus species NOT DETECTED NOT DETECTED Final   Streptococcus agalactiae NOT DETECTED NOT DETECTED Final   Streptococcus pneumoniae NOT DETECTED NOT DETECTED Final   Streptococcus pyogenes NOT DETECTED NOT DETECTED Final   A.calcoaceticus-baumannii NOT DETECTED NOT DETECTED Final   Bacteroides fragilis NOT DETECTED NOT DETECTED Final   Enterobacterales NOT DETECTED NOT DETECTED Final   Enterobacter cloacae complex NOT DETECTED NOT DETECTED Final   Escherichia coli NOT DETECTED NOT DETECTED  Final   Klebsiella aerogenes NOT DETECTED NOT DETECTED Final   Klebsiella oxytoca NOT DETECTED NOT DETECTED Final   Klebsiella pneumoniae NOT DETECTED NOT DETECTED Final   Proteus species NOT DETECTED NOT DETECTED Final   Salmonella species NOT DETECTED NOT DETECTED Final   Serratia marcescens NOT DETECTED NOT DETECTED Final   Haemophilus influenzae NOT DETECTED NOT DETECTED Final   Neisseria meningitidis NOT DETECTED NOT DETECTED Final   Pseudomonas aeruginosa NOT DETECTED NOT DETECTED Final   Stenotrophomonas maltophilia NOT DETECTED NOT DETECTED Final   Candida albicans NOT DETECTED NOT DETECTED Final   Candida auris NOT DETECTED NOT DETECTED Final   Candida glabrata NOT DETECTED NOT DETECTED Final   Candida krusei NOT DETECTED NOT DETECTED Final   Candida parapsilosis NOT DETECTED NOT DETECTED Final   Candida tropicalis NOT DETECTED NOT DETECTED Final   Cryptococcus neoformans/gattii NOT DETECTED NOT DETECTED Final    Comment: Performed at [facility], [address] [place], [place] [address]  Culture, blood (Routine X 2) w Reflex to ID Panel     Status: Abnormal   Collection Time: 03/30/22  1:24 PM   Specimen: BLOOD  Result Value Ref Range Status   Specimen Description BLOOD LEFT ANTECUBITAL  Final   Special Requests   Final    BOTTLES DRAWN AEROBIC AND ANAEROBIC Blood Culture adequate volume   Culture  Setup Time   Final    GRAM POSITIVE COCCI IN CHAINS ANAEROBIC BOTTLE ONLY CRITICAL VALUE NOTED.  VALUE IS CONSISTENT WITH PREVIOUSLY REPORTED AND CALLED VALUE.    Culture (A)  Final    ENTEROCOCCUS AVIUM SUSCEPTIBILITIES PERFORMED ON PREVIOUS CULTURE WITHIN THE LAST 5 DAYS. Performed at [facility], [place] [address] [place], [place] [address]    Report Status 04/02/2022 FINAL  Final  Culture, blood (Routine X 2) w Reflex to ID Panel     Status: None (Preliminary result)   Collection Time: 03/30/22  1:24 PM   Specimen: BLOOD LEFT FOREARM  Result Value Ref  Range Status   Specimen Description BLOOD LEFT FOREARM  Final   Special Requests   Final    BOTTLES DRAWN AEROBIC AND ANAEROBIC Blood Culture adequate volume   Culture   Final    NO GROWTH 3 DAYS Performed at [facility], [place] [address], [place], [place] [address]    Report Status PENDING  Incomplete  Culture, blood (Routine X 2) w Reflex to ID Panel     Status: None (Preliminary result)   Collection Time: 04/01/22 12:21 PM   Specimen: BLOOD  Result Value Ref Range Status   Specimen Description BLOOD LEFT ANTECUBITAL  Final   Special Requests   Final    BOTTLES DRAWN AEROBIC AND ANAEROBIC Blood Culture adequate  volume   Culture   Final    NO GROWTH < 24 HOURS Performed at [facility], [place] [address] [place], [place] [address]    Report Status PENDING  Incomplete  Culture, blood (Routine X 2) w Reflex to ID Panel     Status: None (Preliminary result)   Collection Time: 04/01/22 12:31 PM   Specimen: BLOOD  Result Value Ref Range Status   Specimen Description BLOOD LEFT ANTECUBITAL  Final   Special Requests   Final    BOTTLES DRAWN AEROBIC AND ANAEROBIC Blood Culture adequate volume   Culture   Final    NO GROWTH < 24 HOURS Performed at [facility], [place] [address], [place], [place] [address]    Report Status PENDING  Incomplete    Impression/Plan:  1. Bacteremia - Enterococcus avium and GNR in one bottle.  Most c/w a GI source.  On ampicillin/sulbactam and will continue.  One repeat blood culture also with E avium but others remain ngtd including another set sent yesterday. Plan for picc line removal soon.   Though it would be ideal to remove port-a-cath with bacteremia, if bacteremia clears, I think it is reasonable to keep it if needed again later and monitor surveillance cultures.    2.  Nutrition - now taking good po intake and hopefully to remove picc line today.

## 2022-04-02 NOTE — Progress Notes (Signed)
Mobility Specialist Progress Note:   04/02/22 0900  Mobility  Activity Ambulated with assistance in hallway  Level of Assistance Standby assist, set-up cues, supervision of patient - no hands on  Assistive Device Front wheel walker  Distance Ambulated (ft) 275 ft  Activity Response Tolerated well  $Mobility charge 1 Mobility   Pt eager for mobility session this am. Required no physical assistance throughout. SpO2 WFL on 2LO2. Pt left with all needs met.   Nelta Numbers [facility] Secure Chat or Office Phone: [phone]

## 2022-04-02 NOTE — Progress Notes (Signed)
Mobility Specialist Progress Note:   04/02/22 0930  Mobility  Activity Transferred from bed to chair  Level of Assistance Standby assist, set-up cues, supervision of patient - no hands on  Assistive Device Front wheel walker  Distance Ambulated (ft) 4 ft  Activity Response Tolerated well  $Mobility charge 1 Mobility   Pt agreeable to sit up in chair. Required no physical assist. Will f/u to get pt back to bed.  Nelta Numbers Acute Rehab Secure Chat or Office Phone: [phone]

## 2022-04-03 DIAGNOSIS — T80212A Local infection due to central venous catheter, initial encounter: Secondary | ICD-10-CM | POA: Diagnosis not present

## 2022-04-03 DIAGNOSIS — R7881 Bacteremia: Secondary | ICD-10-CM | POA: Diagnosis not present

## 2022-04-03 LAB — CREATININE, SERUM
Creatinine, Ser: 0.76 mg/dL (ref 0.61–1.24)
GFR, Estimated: >60 mL/min (ref >60)

## 2022-04-03 LAB — GLUCOSE, CAPILLARY
Glucose-Capillary: 113 mg/dL — ABNORMAL HIGH (ref 70–99)
Glucose-Capillary: 98 mg/dL (ref 70–99)
Glucose-Capillary: 122 mg/dL — ABNORMAL HIGH (ref 70–99)
Glucose-Capillary: 109 mg/dL — ABNORMAL HIGH (ref 70–99)

## 2022-04-03 MED ORDER — HYDROMORPHONE HCL 1 MG/ML IJ SOLN
0.5000 mg | Freq: Once | INTRAMUSCULAR | Status: AC
  Administered 2022-04-03: 0.5 mg via INTRAVENOUS
  Filled 2022-04-03: qty 0.5

## 2022-04-03 MED ORDER — BISACODYL 10 MG RE SUPP
10.0000 mg | Freq: Once | RECTAL | Status: AC
  Administered 2022-04-03: 10 mg via RECTAL
  Filled 2022-04-03: qty 1

## 2022-04-03 MED ORDER — KETOROLAC TROMETHAMINE 15 MG/ML IJ SOLN
15.0000 mg | Freq: Three times a day (TID) | INTRAMUSCULAR | Status: DC
  Administered 2022-04-03 – 2022-04-05 (×7): 15 mg via INTRAVENOUS
  Filled 2022-04-03 (×7): qty 1

## 2022-04-03 MED ORDER — DOCUSATE SODIUM 100 MG PO CAPS
100.0000 mg | ORAL_CAPSULE | Freq: Two times a day (BID) | ORAL | Status: DC
  Administered 2022-04-03 – 2022-04-05 (×5): 100 mg via ORAL
  Filled 2022-04-03 (×6): qty 1

## 2022-04-04 LAB — GLUCOSE, CAPILLARY
Glucose-Capillary: 101 mg/dL — ABNORMAL HIGH (ref 70–99)
Glucose-Capillary: 125 mg/dL — ABNORMAL HIGH (ref 70–99)
Glucose-Capillary: 127 mg/dL — ABNORMAL HIGH (ref 70–99)
Glucose-Capillary: 111 mg/dL — ABNORMAL HIGH (ref 70–99)

## 2022-04-04 LAB — CULTURE, BLOOD (ROUTINE X 2)
Culture: NO GROWTH
Special Requests: ADEQUATE

## 2022-04-04 MED ORDER — AMOXICILLIN-POT CLAVULANATE 875-125 MG PO TABS
1.0000 | ORAL_TABLET | Freq: Two times a day (BID) | ORAL | Status: DC
  Administered 2022-04-04 – 2022-04-05 (×3): 1 via ORAL
  Filled 2022-04-04 (×3): qty 1

## 2022-04-04 NOTE — Progress Notes (Signed)
Mobility Specialist Progress Note   04/04/22 1710  Mobility  Activity Ambulated with assistance in hallway  Level of Assistance Standby assist, set-up cues, supervision of patient - no hands on  Assistive Device Front wheel walker  Distance Ambulated (ft) 550 ft  Activity Response Tolerated well  $Mobility charge 1 Mobility   Pre Mobility: 96% SpO2 on RA During Mobility: 92% SpO2 on RA Post Mobility: 98% SpO2 on RA  Received pt in bed having no complaints and agreeable to mobility. Pt requesting to ambulate w/o O2 and was asymptomatic throughout session. SpO2 levels remained >92% throughout, returned to room w/o fault. Left in bed w/ call bell in reach and all needs met.  Frederico Hamman Mobility Specialist Phone Number [phone]

## 2022-04-05 LAB — GLUCOSE, CAPILLARY
Glucose-Capillary: 107 mg/dL — ABNORMAL HIGH (ref 70–99)
Glucose-Capillary: 166 mg/dL — ABNORMAL HIGH (ref 70–99)

## 2022-04-05 MED ORDER — DOCUSATE SODIUM 100 MG PO CAPS
100.0000 mg | ORAL_CAPSULE | Freq: Two times a day (BID) | ORAL | 0 refills | Status: DC
  Filled 2022-04-05 – 2022-04-29 (×3): qty 30, 15d supply, fill #0

## 2022-04-05 MED ORDER — ADULT MULTIVITAMIN W/MINERALS CH
1.0000 | ORAL_TABLET | Freq: Every day | ORAL | 3 refills | Status: DC
  Filled 2022-04-05 – 2022-04-29 (×3): qty 30, 30d supply, fill #0

## 2022-04-05 MED ORDER — AMOXICILLIN-POT CLAVULANATE 875-125 MG PO TABS: 1.0000 | ORAL_TABLET | Freq: Two times a day (BID) | ORAL | 0 refills | Status: AC

## 2022-04-05 MED ORDER — ACETAMINOPHEN 325 MG PO TABS: 650.0000 mg | ORAL_TABLET | Freq: Four times a day (QID) | ORAL | Status: AC | PRN

## 2022-04-05 MED ORDER — METHOCARBAMOL 1000 MG PO TABS: 1000.0000 mg | ORAL_TABLET | Freq: Four times a day (QID) | ORAL | 0 refills | Status: DC | PRN

## 2022-04-05 MED ORDER — OXYCODONE HCL 10 MG PO TABS: 10.0000 mg | ORAL_TABLET | Freq: Four times a day (QID) | ORAL | 0 refills | Status: AC | PRN

## 2022-04-05 MED ORDER — POLYETHYLENE GLYCOL 3350 17 G PO PACK
17.0000 g | PACK | Freq: Every day | ORAL | 0 refills | Status: AC
  Filled 2022-04-05: qty 14, 14d supply, fill #0

## 2022-04-05 NOTE — Discharge Summary (Signed)
Physician Discharge Summary   Patient ID: Eric [name] [id number] 51 y.o. 01-22-1971  Admit date: 03/13/2022  Discharge date and time: 04/05/2022  4:22 PM   Admitting Physician: Dwan Bolt, MD   Discharge Physician: Michaelle Birks, MD  Admission Diagnoses: Colon cancer metastasized to liver ([place]) [C18.9, C78.7], ileostomy in place  Discharge Diagnoses: Stage IV colon cancer, postoperative fascial dehiscence, malnutrition  Admission Condition: Good  Discharged Condition: fair  Indication for Admission: Eric [name] is a 51 yo male with stage IV colon cancer. He presented in April 2022 with a perforated cecal mass and underwent right hemicolectomy with end ileostomy at that time. He was found to have multiple small liver lesions and was treated with adjuvant FOLFOX. He then began maintenance Xeloda and the liver lesions have remained stable. After a multidisciplinary discussion, he has agreed to proceed with metastasectomy and ileostomy reversal. Please see separate H&P for further details.   Hospital Course: The patient was taken to the OR on 03/13/22 for an open adhesiolysis, hepatic wedge resections, cholecystectomy, intraoperative ultrasound and ileocolic anastomosis. Please see separately dictated operative note for further details of this procedure.  Postoperatively he was admitted to the floor in stable condition.  His Foley was removed on POD1 and he was able to void spontaneously.  He had mild tachycardia and leukocytosis for the first few postoperative days but remained afebrile.  He then developed symptoms of ileus and an NG tube was placed on postop day 4.  CT scan on POD 7 did not show any intra-abdominal fluid collections, signs of anastomotic leak, or PE.  He was started on TPN.  The NG tube was removed on POD 9.  He was taken back to the operating room urgently by Dr. [name] on 6/12 for attempted washout and closure.  Intraoperatively his abdomen was frozen and the fascia could not be  safely reapproximated.  Thus a Vicryl mesh bridge was placed.  Postoperatively, TPN was continued and his diet was slowly advanced.  He was transfused 1 unit PRBCs for symptomatic anemia with tachycardia.  He had persistent leukocytosis and intermittent fevers.  A repeat abdominal CT scan showed expected fluid in the gallbladder fossa and at the liver resection sites but no other signs of intra-abdominal infections.  Blood cultures were positive for Enterococcus avium, and he was started on IV antibiotics.  Infectious diseases was consulted for bacteremia.  The patient had return of bowel function and was advanced to a soft diet.  He initially had a poor appetite with low p.o. intake thus TPN was slowly weaned.  Once TPN was discontinued, the PICC line was removed.  Subsequent blood cultures were negative.  He was transitioned to IV Unasyn per ID recommendations based on blood culture sensitivities.  WBC normalized and fevers and tachycardia resolved.  He worked with physical therapy and progress with mobility.  Appetite and oral intake improved.  He was transition to oral antibiotics prior to discharge.  On the morning of 6/25, the patient was tolerating a soft diet with good p.o. intake, he was afebrile and hemodynamically stable, he was having regular bowel function, and able to ambulate independently.  He was no longer requiring oxygen, and his most recent blood cultures were negative at 3 days.  He was examined and deemed appropriate for discharge home with home health nursing and close outpatient follow-up. He will continue on oral Augmentin for 3 weeks per ID recommendations.  Consults: ID  Significant Diagnostic Studies: microbiology: blood culture: positive  for Enterococcus avium  and radiology: CT scan: negative for PE; gas/fluid in gallbladder fossa and hepatic resection sites.  Treatments: IV hydration, antibiotics: Unasyn, analgesia: acetaminophen, Dilaudid, and oxycodone, TPN, respiratory  therapy: O2 and Breo inhaler, therapies: PT and OT, and surgery: 1. Ex lap, adhesiolysis, intraoperative liver US, cholecystectomy, hepatic metastasectomy x2; 2. Reopening of laparotomy, placement of Vicryl mesh bridge for fascial dehiscence  Discharge Exam: General: resting comfortably, NAD Neuro: alert and oriented Resp: normal work of breathing on room air Abdomen: soft, nondistended, nontender. Prior stoma site in RLQ is clean and granulating. Midline wound with vicryl mesh at the base, fibrinous exudate, no purulent drainage.  Disposition: Discharge disposition: 06-Home-Health Care Svc       Patient Instructions:  Allergies as of 04/05/2022   No Known Allergies      Medication List     STOP taking these medications    capecitabine 500 MG tablet Commonly known as: XELODA   diphenoxylate-atropine 2.5-0.025 MG tablet Commonly known as: Lomotil   polycarbophil 625 MG tablet Commonly known as: FIBERCON       TAKE these medications    acetaminophen 325 MG tablet Commonly known as: TYLENOL Take 2 tablets (650 mg total) by mouth every 6 (six) hours as needed for mild pain or fever.   albuterol 108 (90 Base) MCG/ACT inhaler Commonly known as: VENTOLIN HFA Inhale 2 puffs into the lungs every 6 (six) hours as needed for wheezing or shortness of breath. What changed:  how much to take when to take this   amoxicillin-clavulanate 875-125 MG tablet Commonly known as: AUGMENTIN Take 1 tablet by mouth every 12 (twelve) hours for 18 days.   Breo Ellipta 200-25 MCG/ACT Aepb Generic drug: fluticasone furoate-vilanterol Inhale 1 puff into the lungs daily.   docusate sodium 100 MG capsule Commonly known as: COLACE Take 1 capsule by mouth 2 times daily.   gabapentin 300 MG capsule Commonly known as: NEURONTIN TAKE 1 CAPSULE BY MOUTH 3 TIMES A DAY   lidocaine-prilocaine cream Commonly known as: EMLA Apply to port site 1-2 hours prior to stick and cover with plastic  wrap to numb site as directed. What changed: additional instructions   Methocarbamol 1000 MG Tabs Take 1,000 mg by mouth every 6 (six) hours as needed for muscle spasms.   multivitamin with minerals Tabs tablet Take 1 tablet by mouth daily.   ondansetron 8 MG tablet Commonly known as: ZOFRAN Take 1 tablet (8 mg total) by mouth every 8 (eight) hours as needed for vomiting or nausea. What changed: when to take this   Oxycodone HCl 10 MG Tabs Take 1 tablet (10 mg total) by mouth every 6 (six) hours as needed for up to 7 days for severe pain. What changed:  how much to take reasons to take this   pantoprazole 40 MG tablet Commonly known as: PROTONIX Take 1 tablet by mouth daily.   PEG 3350 17 g Pack Take 17 g by mouth daily.   prochlorperazine 10 MG tablet Commonly known as: COMPAZINE Take 1 tablet (10 mg total) by mouth every 6 (six) hours as needed for nausea. What changed: when to take this       Activity:  No heavy lifting, wear abdominal binder at all times when out of bed, no driving. Sponge bath only, do not shower or submerge wounds underwater. Diet: regular diet Wound Care:  Place mepitel at base of midline wound, change every 3 days. On top of mepitel, pack wound with saline-moistened  kerlex. Change kerlex BID.  Cover with ABD pads and secure with tape. To prior ostomy site, cover with dry gauze, change BID.  Follow-up with Dr. [name] in 1 week.  Signed: Dwan Bolt 04/08/2022 3:22 PM

## 2022-04-06 ENCOUNTER — Other Ambulatory Visit (HOSPITAL_COMMUNITY): Payer: Self-pay

## 2022-04-06 LAB — CULTURE, BLOOD (ROUTINE X 2)
Culture: NO GROWTH
Culture: NO GROWTH
Special Requests: ADEQUATE
Special Requests: ADEQUATE

## 2022-04-07 ENCOUNTER — Telehealth: Payer: Self-pay | Admitting: *Deleted

## 2022-04-07 ENCOUNTER — Ambulatory Visit: Payer: 59 | Admitting: Pulmonary Disease

## 2022-04-07 ENCOUNTER — Telehealth: Payer: Self-pay | Admitting: Nurse Practitioner

## 2022-04-07 LAB — CULTURE, BLOOD (ROUTINE X 2)

## 2022-04-10 ENCOUNTER — Inpatient Hospital Stay: Payer: 59 | Admitting: Nurse Practitioner

## 2022-04-15 ENCOUNTER — Ambulatory Visit: Payer: 59 | Admitting: Family

## 2022-04-15 ENCOUNTER — Other Ambulatory Visit: Payer: Self-pay

## 2022-04-15 ENCOUNTER — Encounter: Payer: Self-pay | Admitting: Family

## 2022-04-15 VITALS — BP 128/88 | HR 99 | Temp 97.7°F | Ht 75.0 in | Wt 154.0 lb

## 2022-04-15 DIAGNOSIS — C189 Malignant neoplasm of colon, unspecified: Secondary | ICD-10-CM | POA: Diagnosis not present

## 2022-04-15 DIAGNOSIS — C787 Secondary malignant neoplasm of liver and intrahepatic bile duct: Secondary | ICD-10-CM

## 2022-04-15 DIAGNOSIS — Z95828 Presence of other vascular implants and grafts: Secondary | ICD-10-CM | POA: Diagnosis not present

## 2022-04-15 DIAGNOSIS — R7881 Bacteremia: Secondary | ICD-10-CM

## 2022-04-15 DIAGNOSIS — B952 Enterococcus as the cause of diseases classified elsewhere: Secondary | ICD-10-CM | POA: Diagnosis not present

## 2022-04-15 NOTE — Progress Notes (Signed)
Subjective:    Patient ID: Eric [name], male    DOB: 1971/01/15, 51 y.o.   MRN: [id number]  Chief Complaint  Patient presents with   Hospitalization Follow-up    HPI:  Eric [name] is a 51 y.o. male with previous medical history of colon cancer with liver metastatic disease s/p explap, wedge resection of the liver and end ileostomy takedown complicated by development of Enterococcal avium bacteremia with negative TTE and rapid clearing blood cultures presents today for hospitalization follow up.  Eric [name] was discharged on 04/05/22 with 3 weeks of Augmentin. Source of infection was believed to be GI related. In the interim cultures have added Fusobacterium nucleatum to the original set of cultures.  Has been taking his Augmentin as prescribed with no adverse side effects. Feeling well with no fevers or chills. Port remains and has follow up with Oncology later this week.   No Known Allergies    Outpatient Medications Prior to Visit  Medication Sig Dispense Refill   acetaminophen (TYLENOL) 325 MG tablet Take 2 tablets (650 mg total) by mouth every 6 (six) hours as needed for mild pain or fever.     albuterol (VENTOLIN HFA) 108 (90 Base) MCG/ACT inhaler Inhale 2 puffs into the lungs every 6 (six) hours as needed for wheezing or shortness of breath. (Patient taking differently: Inhale 1 puff into the lungs daily.) 18 g 0   amoxicillin-clavulanate (AUGMENTIN) 875-125 MG tablet Take 1 tablet by mouth every 12 (twelve) hours for 18 days. 36 tablet 0   docusate sodium (COLACE) 100 MG capsule Take 1 capsule by mouth 2 times daily. 30 capsule 0   fluticasone furoate-vilanterol (BREO ELLIPTA) 200-25 MCG/ACT AEPB Inhale 1 puff into the lungs daily. 60 each 5   gabapentin (NEURONTIN) 300 MG capsule TAKE 1 CAPSULE BY MOUTH 3 TIMES A DAY 90 capsule 1   lidocaine-prilocaine (EMLA) cream Apply to port site 1-2 hours prior to stick and cover with plastic wrap to numb site as directed. (Patient taking  differently: Apply 1 application  topically as directed. Every two week when cleaned out) 30 g 2   methocarbamol 1000 MG TABS Take 1,000 mg by mouth every 6 (six) hours as needed for muscle spasms. 30 tablet 0   ondansetron (ZOFRAN) 8 MG tablet Take 1 tablet (8 mg total) by mouth every 8 (eight) hours as needed for vomiting or nausea. (Patient taking differently: Take 8 mg by mouth at bedtime as needed for vomiting or nausea.) 30 tablet 2   pantoprazole (PROTONIX) 40 MG tablet Take 1 tablet by mouth daily. 30 tablet 2   polyethylene glycol (MIRALAX / GLYCOLAX) 17 g packet Take 17 g by mouth daily. 14 each 0   prochlorperazine (COMPAZINE) 10 MG tablet Take 1 tablet (10 mg total) by mouth every 6 (six) hours as needed for nausea. (Patient taking differently: Take 10 mg by mouth daily.) 60 tablet 1   Multiple Vitamin (MULTIVITAMIN WITH MINERALS) TABS tablet Take 1 tablet by mouth daily. (Patient not taking: Reported on 04/15/2022) 30 tablet 3   No facility-administered medications prior to visit.     Past Medical History:  Diagnosis Date   Cancer [facility])    Colon   COPD (chronic obstructive pulmonary disease) ([place])    COVID-19 virus infection 11/15/2020   Dyspnea      Past Surgical History:  Procedure Laterality Date   CHOLECYSTECTOMY N/A 03/13/2022   Procedure: CHOLECYSTECTOMY;  Surgeon: Dwan Bolt, MD;  Location: [facility]  OR;  Service: General;  Laterality: N/A;   CYSTOSCOPY WITH STENT PLACEMENT N/A 11/28/2020   Procedure: CYSTOSCOPY WITH STENT PLACEMENT;  Surgeon: Janith Lima, MD;  Location: [place];  Service: Urology;  Laterality: N/A;   ILEOSTOMY CLOSURE N/A 03/13/2022   Procedure: OPEN ILEOSTOMY REVERSAL;  Surgeon: Dwan Bolt, MD;  Location: [place];  Service: General;  Laterality: N/A;   INSERTION OF MESH N/A 03/23/2022   Procedure: INSERTION OF MESH;  Surgeon: Greer Pickerel, MD;  Location: [place];  Service: General;  Laterality: N/A;   IR RADIOLOGIST EVAL & MGMT  07/22/2021   LAPAROTOMY  N/A 11/28/2020   Procedure: EXPLORATORY LAPAROTOMY;  Surgeon: Ralene Ok, MD;  Location: [place];  Service: General;  Laterality: N/A;  DOW CASE TO START AT 730AM 4 HOUR CASE   LAPAROTOMY N/A 03/23/2022   Procedure: EXPLORATORY LAPAROTOMY;  Surgeon: Greer Pickerel, MD;  Location: [place];  Service: General;  Laterality: N/A;   LIVER ULTRASOUND N/A 03/13/2022   Procedure: INTRAOPERATIVE LIVER ULTRASOUND;  Surgeon: Dwan Bolt, MD;  Location: [place];  Service: General;  Laterality: N/A;   OPEN PARTIAL HEPATECTOMY  N/A 03/13/2022   Procedure: PARTIAL HEPATECTOMY;  Surgeon: Dwan Bolt, MD;  Location: [place];  Service: General;  Laterality: N/A;   OSTOMY N/A 11/28/2020   Procedure: ILEOSTOMY CREATION;  Surgeon: Ralene Ok, MD;  Location: [place];  Service: General;  Laterality: N/A;   PARTIAL COLECTOMY Right 11/28/2020   Procedure: RIGHT COLECTOMY;  Surgeon: Ralene Ok, MD;  Location: [place];  Service: General;  Laterality: Right;   PORTACATH PLACEMENT Right 12/12/2020   Procedure: INSERTION PORT-A-CATH;  Surgeon: Dwan Bolt, MD;  Location: [place];  Service: General;  Laterality: Right;       Review of Systems  Constitutional:  Negative for chills, diaphoresis, fatigue and fever.  Respiratory:  Negative for cough, chest tightness, shortness of breath and wheezing.   Cardiovascular:  Negative for chest pain.  Gastrointestinal:  Negative for abdominal pain, diarrhea, nausea and vomiting.      Objective:    BP 128/88   Pulse 99   Temp 97.7 F (36.5 C) (Oral)   Ht '6\' 3"'$  (1.905 m)   Wt 154 lb (69.9 kg)   SpO2 96%   BMI 19.25 kg/m  Nursing note and vital signs reviewed.  Physical Exam Constitutional:      General: He is not in acute distress.    Appearance: He is well-developed.  Cardiovascular:     Rate and Rhythm: Normal rate and regular rhythm.     Heart sounds: Normal heart sounds.  Pulmonary:     Effort: Pulmonary effort is normal.     Breath sounds: Normal breath  sounds.  Skin:    General: Skin is warm and dry.  Neurological:     Mental Status: He is alert and oriented to person, place, and time.  Psychiatric:        Mood and Affect: Mood normal.         04/15/2022    9:30 AM 12/09/2021   11:46 AM  Depression screen PHQ 2/9  Decreased Interest 0 0  Down, Depressed, Hopeless 0 0  PHQ - 2 Score 0 0       Assessment & Plan:    Patient Active Problem List   Diagnosis Date Noted   Enterococcal bacteremia 04/15/2022   Port-A-Cath in place 04/15/2022   Ileus, postoperative ([place]) 03/18/2022   Colon cancer metastasized to liver (  [place]) 03/13/2022   Centrilobular emphysema ([place]) 02/04/2022   SOB (shortness of breath) 09/23/2021   DOE (dyspnea on exertion) - rule out PE 09/22/2021   Dyspnea on exertion 09/22/2021   Goals of care, counseling/discussion 12/18/2020   Perforated colon cancer s/p right colectomy/ileostomy 11/28/2020 12/01/2020   Malnutrition of moderate degree 12/01/2020   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 ([place]) 11/21/2020   Protein-calorie malnutrition, severe ([place]) 11/18/2020   Liver mass, right lobe 11/18/2020   Right groin pain 11/18/2020   Ambulates with cane 11/18/2020   Tobacco abuse 11/18/2020   Fatigue 11/18/2020   Symptomatic anemia 11/18/2020   Colonic mass 11/15/2020   Iron deficiency anemia due to chronic blood loss 11/15/2020     Problem List Items Addressed This Visit       Digestive   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 [facility])    Has appointment scheduled with Oncology to discuss further plan of care.         Other   Enterococcal bacteremia - Primary    Eric [name] has been doing well since leaving the hospital and continues to take Augmentin twice daily as prescribed with no adverse side effects. No systemic symptoms. Discussed there is still concern for possible recurrent infection given that he continues to have the port. Will continue Augmentin through 04/26/22 as planned. Schedule  surveillance blood cultures for 2-3 weeks after completion of Augmentin. If fevers or bacteremia return Port will need to be removed.       Port-A-Cath in place    Eric [name] continues to have a Port-a-cath in place and discussed the potential risk for recurrent infection given Enterococcal bacteremia. Blood cultures cleared quickly which is encouraging. He will discuss continued need for port with Oncology later this week. Ideally this will be removed if not needed. Will recheck surveillance cultures after completion of antibiotics.         I am having Steva Ready. Crites maintain his lidocaine-prilocaine, albuterol, ondansetron, prochlorperazine, fluticasone furoate-vilanterol, gabapentin, pantoprazole, multivitamin with minerals, polyethylene glycol, docusate sodium, acetaminophen, Methocarbamol, and amoxicillin-clavulanate.   Follow-up: Pending surveillance cultures as needed.    Terri Piedra, MSN, FNP-C Nurse Practitioner [facility] [place] number: [phone]

## 2022-04-15 NOTE — Assessment & Plan Note (Signed)
Mr. Hoefle continues to have a Port-a-cath in place and discussed the potential risk for recurrent infection given Enterococcal bacteremia. Blood cultures cleared quickly which is encouraging. He will discuss continued need for port with Oncology later this week. Ideally this will be removed if not needed. Will recheck surveillance cultures after completion of antibiotics.

## 2022-04-15 NOTE — Assessment & Plan Note (Signed)
Eric [name] has been doing well since leaving the hospital and continues to take Augmentin twice daily as prescribed with no adverse side effects. No systemic symptoms. Discussed there is still concern for possible recurrent infection given that he continues to have the port. Will continue Augmentin through 04/26/22 as planned. Schedule surveillance blood cultures for 2-3 weeks after completion of Augmentin. If fevers or bacteremia return Port will need to be removed.

## 2022-04-15 NOTE — Assessment & Plan Note (Signed)
Has appointment scheduled with Oncology to discuss further plan of care.

## 2022-04-15 NOTE — Patient Instructions (Addendum)
Nice to see you.  Continue to take your medication daily as prescribed.  Recheck blood cultures the week of Aug 7th.  Follow up with ID as needed.  Have a great day and stay safe!

## 2022-04-16 ENCOUNTER — Inpatient Hospital Stay: Payer: 59 | Attending: Oncology | Admitting: Nurse Practitioner

## 2022-04-16 ENCOUNTER — Other Ambulatory Visit (HOSPITAL_COMMUNITY): Payer: Self-pay

## 2022-04-16 ENCOUNTER — Encounter: Payer: Self-pay | Admitting: Nurse Practitioner

## 2022-04-16 VITALS — BP 125/99 | HR 66 | Temp 98.2°F | Resp 18 | Ht 75.0 in | Wt 153.2 lb

## 2022-04-16 DIAGNOSIS — C189 Malignant neoplasm of colon, unspecified: Secondary | ICD-10-CM

## 2022-04-16 DIAGNOSIS — C180 Malignant neoplasm of cecum: Secondary | ICD-10-CM | POA: Insufficient documentation

## 2022-04-16 DIAGNOSIS — C787 Secondary malignant neoplasm of liver and intrahepatic bile duct: Secondary | ICD-10-CM | POA: Diagnosis not present

## 2022-04-16 MED ORDER — OXYCODONE HCL 5 MG PO TABS
5.0000 mg | ORAL_TABLET | Freq: Two times a day (BID) | ORAL | 0 refills | Status: DC | PRN
  Filled 2022-04-16: qty 30, 15d supply, fill #0

## 2022-04-16 NOTE — Progress Notes (Signed)
[place] OFFICE PROGRESS NOTE   Diagnosis: Colon cancer  INTERVAL HISTORY:   Eric [name] returns as scheduled.  He underwent hepatic wedge resection X 2 and ileostomy reversal 03/13/2022.  He developed an ileus postoperatively requiring NG tube placement.  He was taken back to the OR 03/23/2022 with wound dehiscence.  He had a positive blood culture for Enterococcus avium, initial IV antibiotics.  He was discharged home 04/05/2022.  He is currently completing outpatient Augmentin.  He is following up with ID, surveillance blood cultures planned 2 to 3 weeks after he completes Augmentin.  Overall he feels he is doing well.  He takes 1 oxycodone tablet at bedtime.  Appetite is good.  Bowels are moving.  No nausea or vomiting.  Objective:  Vital signs in last 24 hours:  Blood pressure (!) 125/99, pulse 66, temperature 98.2 F (36.8 C), temperature source Oral, resp. rate 18, height _0  (1.905 m), weight 153 lb 3.2 oz (69.5 kg), SpO2 100 %.     Resp: Lungs clear bilaterally. Cardio: Regular rate and rhythm. GI: Midline wound appears to be healing. Vascular: No leg edema. Port-A-Cath without erythema.  Lab Results:  Lab Results  Component Value Date   WBC 9.7 04/02/2022   HGB 9.0 (L) 04/02/2022   HCT 30.3 (L) 04/02/2022   MCV 93.2 04/02/2022   PLT 354 04/02/2022   NEUTROABS 3.7 02/20/2022    Imaging:  No results found.  Medications: I have reviewed the patient's current medications.  Assessment/Plan: Stage IV adenocarcinoma of the colon -11/15/2020 CT abdomen/pelvis with contrast-long segment masslike mural thickening of the cecum, posterior perforation at the site of the cecal wall thickening with multilocular right retroperitoneal abscess, indeterminate hypodensity in the inferior right lobe of the liver. -11/19/2020 CEA 0.8 -11/20/2020 MRI of the abdomen and pelvis with and without contrast-3.5 x 2.5 cm heterogeneous enhancing lesion in the inferior peripheral  right liver with 2 smaller lesions with similar features. -11/20/2020 ultrasound-guided liver biopsy consistent with adenocarcinoma.  Foundation 1-MSS, tumor mutation burden 8,KRAS wt -11/25/2020 CT abdomen/pelvis with contrast-interval placement of a right lower quadrant percutaneous pigtail drainage catheter with near resolution of the retroperitoneal fluid collection, redemonstrated large fungating mass at the cecal base measuring 11.9 x 9.1 x 8.4 cm with enlarged lymph nodes in the right lower quadrant mesocolon. -11/28/2020 exploratory laparotomy, right colectomy, ileostomy creation, cystoscopy with stent placement             -Surgical pathology showed adenocarcinoma, moderately differentiated, 12 cm, 0/14+ lymph nodes, lymphovascular space invasion present, radial margin involved by invasive adenocarcinoma, appendix with intraluminal             adenocarcinoma, pT4, PN 0 -12/09/2020 CT abdomen/pelvis with contrast-interval right colectomy and right lower quadrant ileostomy, stable right retroperitoneal soft tissue density along the psoas and iliacus muscles, mild progression of liver metastases since prior study. -Cycle 1 FOLFOX 12/24/2020 -Cycle 2 FOLFOX 01/06/2021 -Cycle 3 FOLFOX 01/20/2021 -Cycle 4 FOLFOX 02/03/2021 -Cycle 5 FOLFOX 02/17/2021 -Cycle 6 FOLFOX 03/03/2021 -Restaging CTs 03/05/2021- substantial interval decrease in size of predominantly hypodense lesions throughout the liver.  Significant interval resolution of previously seen extensive, heterogeneous soft tissue in the right retroperitoneal soft tissues and paracolic gutter overlying the psoas and iliacus muscles. -Cycle 7 FOLFOX 03/19/2021 -Cycle 8 FOLFOX 04/03/2021, oxaliplatin dose reduced secondary to neutropenia and thrombocytopenia -Cycle 9 FOLFOX 04/15/2021 -Cycle 10 FOLFOX 04/29/2021 -Cycle 11 FOLFOX 05/13/2021, oxaliplatin held secondary to neuropathy -Cycle 12 FOLFOX 05/27/2021, oxaliplatin held secondary  to neuropathy -CT  abdomen/pelvis 06/09/2021-decrease size of remaining dominant liver lesion, other liver lesions are calcified and punctate, subtle density in the posterior right bladder, unchanged sclerotic right iliac lesion -Cycle 13 FOLFOX 06/10/2021, oxaliplatin held secondary to neuropathy -Maintenance Xeloda 06/25/2021 -MRI liver 07/03/2021-no significant change in subtle irregular nonenhancing remnant liver lesions of the inferior right lobe of the liver measuring 1.2 x 0.6 cm and adjacent to the gallbladder fossa measuring no greater than 0.5 cm. -Maintenance Xeloda continued -Tumor conference 08/06/2021-rescan at a 1-monthinterval -Xeloda decreased to 1000 mg a.m., 500 mg p.m. 08/05/2021 (due to hand-foot syndrome) -MRI liver 10/31/2021-treated liver lesion within the inferior right hepatic lobe measuring 0.9 cm.  Small treated lesion within the posterior medial right hepatic lobe measuring 5 mm, unchanged. -Maintenance Xeloda continued -Xeloda discontinued 02/20/2022 -03/13/2022 hepatic wedge resection x2 and ileostomy reversal (nodule left liver bile duct hamartoma; nodule left liver #2 bile duct hamartoma; segment 5 liver mass benign fibrotic and calcified nodule, background liver parenchyma with features of nodular regenerative hyperplasia, no evidence of malignancy; segment 4 liver mass small incidental bile duct hamartoma, background liver parenchyma with features of nodular regenerative hyperplasia, no evidence of malignancy)   2.  Sepsis secondary to perforated colon 3.  Retroperitoneal abscess 4.  Protein calorie malnutrition 5.  Thrombocytosis 6.  Iron deficiency anemia 7.  COVID-19 infection, 11/15/2020 8.  Tobacco abuse 9.  Family history of colon cancer 10.  Port-A-Cath placement, Dr. 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  Admission 09/22/2021 with dehydration and probable COPD flare 13.  Enterococcal  bacteremia 03/27/2022, completing outpatient Augmentin, followed by ID      Disposition: Eric [name] stable.  He continues to recover from the recent surgery, overall seems to be doing fairly well.  He has follow-up tomorrow with Dr. AZenia Resides  No further chemotherapy is planned at this time.  Port-A-Cath could be removed.  He is currently taking oxycodone 10 mg at bedtime for abdominal pain.  New prescription sent to his pharmacy for oxycodone 5 mg every 12 hours as needed.  He will return for follow-up here in 6 to 8 weeks.  We are available to see him sooner if needed.  Patient seen with Dr. SBenay Spice  LNed CardANP/GNP-BC   04/16/2022  11:58 AM This was a shared visit with LNed Card  Eric [name] interviewed and examined.  He continues to recover from the liver resection and ileostomy takedown.  The abdominal wound appears to be healing.  No remaining tumor was found in the segment 5 resection or additional liver nodule biopsies.  The plan is to follow him with observation.  He is scheduled to see Dr. AZenia Residestomorrow.  She will decide on Port-A-Cath removal based on the recent episode of bacteremia.  I was present for greater than 50% of today's visit.  I performed medical decision making.  BJulieanne Manson MD

## 2022-04-20 ENCOUNTER — Other Ambulatory Visit (HOSPITAL_COMMUNITY): Payer: Self-pay

## 2022-04-20 MED ORDER — AMOXICILLIN-POT CLAVULANATE 875-125 MG PO TABS
ORAL_TABLET | ORAL | 0 refills | Status: DC
  Filled 2022-04-20: qty 8, 4d supply, fill #0

## 2022-04-24 ENCOUNTER — Ambulatory Visit (INDEPENDENT_AMBULATORY_CARE_PROVIDER_SITE_OTHER): Payer: 59 | Admitting: Pulmonary Disease

## 2022-04-24 ENCOUNTER — Encounter: Payer: Self-pay | Admitting: Pulmonary Disease

## 2022-04-24 VITALS — BP 112/80 | HR 102 | Ht 74.0 in | Wt 155.0 lb

## 2022-04-24 DIAGNOSIS — J432 Centrilobular emphysema: Secondary | ICD-10-CM

## 2022-04-24 NOTE — Progress Notes (Signed)
Subjective:   PATIENT ID: Eric [name] GENDER: male DOB: November 12, 1970, MRN: [id number]   HPI  Chief Complaint  Patient presents with   Follow-up    PFT performed today.  Pt states he has been doing okay since last visit and denies any complaints.    Reason for Visit: Follow-up   [name] is a 51 year old male active smoker with stage IV adenocarcinoma of the colon s/p chemotherapy s/p right hemicolectomy and COPD who presents for follow-up  Note by Dr. [name] on 12/02/21 reviewed. He was referred to surgery for a ileostomy reversal and possible resection liver lesions. Referred to Pulmonary for PFTs and pre-op clearance.  He reports shortness of breath and unproductive cough. Occurs in the morning. Improves with rest. Worsens with getting up too quickly. Denies wheezing. Reports occasional runny nose. Occasional chest congestion. He is compliant with Breo daily which he started in Feb. Uses albuterol once daily every morning. Continues to smoke. Down to 1/2 ppd. Vapes occasionally. He has no limitation in activity. Able to climb a flight of stair at a slow pace. Able to walk on flat surfaces >200 ft without stopping.  04/24/22 Since increasing Breo, his cough has nearly resolved. He has nearly quit smoking but will sometimes vape. He only has shortness of breath in the morning but otherwise doing. Chest congestion is improved. Compliant with Breo and still uses albuterol once daily in the morning. Two months ago he had his open ileostomy reversal and has limited walking upstairs. Able to walk flat without issues and currently participating with home PT weekly.  Social History: Active smoker. Previously 1.5 ppd x 30 years  Past Medical History:  Diagnosis Date   Cancer ([place])    Colon   COPD (chronic obstructive pulmonary disease) ([place])    COVID-19 virus infection 11/15/2020   Dyspnea      Family History  Problem Relation Age of Onset   Diabetes Sister    Colon cancer Other         Family member unknown   Esophageal cancer Neg Hx    Stomach cancer Neg Hx    Pancreatic cancer Neg Hx    Colon polyps Neg Hx      Social History   Occupational History   Not on file  Tobacco Use   Smoking status: Some Days    Packs/day: 0.25    Years: 30.00    Total pack years: 7.50    Types: Cigarettes, E-cigarettes   Smokeless tobacco: Never   Tobacco comments:    Pt currently using vape and 1-2 cigarettes every few days (pt states he is cutting back, as of 04/24/22)  Vaping Use   Vaping Use: Every day  Substance and Sexual Activity   Alcohol use: Not Currently   Drug use: Yes    Types: Marijuana    Comment: Uses it daily to help him sleep when he goes to bed   Sexual activity: Not on file    No Known Allergies   Outpatient Medications Prior to Visit  Medication Sig Dispense Refill   acetaminophen (TYLENOL) 325 MG tablet Take 2 tablets (650 mg total) by mouth every 6 (six) hours as needed for mild pain or fever.     albuterol (VENTOLIN HFA) 108 (90 Base) MCG/ACT inhaler Inhale 2 puffs into the lungs every 6 (six) hours as needed for wheezing or shortness of breath. (Patient taking differently: Inhale 1 puff into the lungs daily.) 18 g 0  docusate sodium (COLACE) 100 MG capsule Take 1 capsule by mouth 2 times daily. 30 capsule 0   fluticasone furoate-vilanterol (BREO ELLIPTA) 200-25 MCG/ACT AEPB Inhale 1 puff into the lungs daily. 60 each 5   gabapentin (NEURONTIN) 300 MG capsule TAKE 1 CAPSULE BY MOUTH 3 TIMES A DAY 90 capsule 1   lidocaine-prilocaine (EMLA) cream Apply to port site 1-2 hours prior to stick and cover with plastic wrap to numb site as directed. (Patient taking differently: Apply 1 application  topically as directed. Every two week when cleaned out) 30 g 2   methocarbamol 1000 MG TABS Take 1,000 mg by mouth every 6 (six) hours as needed for muscle spasms. 30 tablet 0   Multiple Vitamin (MULTIVITAMIN WITH MINERALS) TABS tablet Take 1 tablet by mouth  daily. 30 tablet 3   ondansetron (ZOFRAN) 8 MG tablet Take 1 tablet (8 mg total) by mouth every 8 (eight) hours as needed for vomiting or nausea. (Patient taking differently: Take 8 mg by mouth at bedtime as needed for vomiting or nausea.) 30 tablet 2   oxyCODONE (OXY IR/ROXICODONE) 5 MG immediate release tablet Take 1 tablet (5 mg total) by mouth every 12 (twelve) hours as needed for severe pain. 30 tablet 0   pantoprazole (PROTONIX) 40 MG tablet Take 1 tablet by mouth daily. 30 tablet 2   polyethylene glycol (MIRALAX / GLYCOLAX) 17 g packet Take 17 g by mouth daily. 14 each 0   prochlorperazine (COMPAZINE) 10 MG tablet Take 1 tablet (10 mg total) by mouth every 6 (six) hours as needed for nausea. (Patient taking differently: Take 10 mg by mouth daily.) 60 tablet 1   amoxicillin-clavulanate (AUGMENTIN) 875-125 MG tablet Take 1 tablet (875 mg total) by mouth every 12 (twelve) hours for 4 days 8 tablet 0   oxyCODONE (OXYCONTIN) 10 mg 12 hr tablet Take 10 mg by mouth every 12 (twelve) hours.     No facility-administered medications prior to visit.    Review of Systems  Constitutional:  Negative for chills, diaphoresis, fever, malaise/fatigue and weight loss.  HENT:  Negative for congestion.   Respiratory:  Negative for cough, hemoptysis, sputum production, shortness of breath and wheezing.   Cardiovascular:  Negative for chest pain, palpitations and leg swelling.     Objective:   Vitals:   04/24/22 1340  BP: 112/80  Pulse: (!) 102  SpO2: 99%  Weight: 155 lb (70.3 kg)  Height: '6\' 2"'$  (1.88 m)   SpO2: 99 % (RA) O2 Device: None (Room air)  Physical Exam: General: Well-appearing, no acute distress HENT: [place], AT Eyes: EOMI, no scleral icterus Respiratory: Clear to auscultation bilaterally.  No crackles, wheezing or rales Cardiovascular: RRR, -M/R/G, no JVD Extremities:-Edema,-tenderness Neuro: AAO x4, CNII-XII grossly intact Psych: Normal mood, normal affect  Data  Reviewed:  Imaging: CTA 11/15/20 - Mild centrilobular emphysema, upper lobe predominant  PFT: 04/24/22 FVC 3.42 (60%) FEV1 2.51 (57%) Ratio 68  TLC 76% DLCO 48% Interpretation: Moderately severe obstructive lung defect with air trapping and decreased DLCO consistent with emphysema. Significant bronchodilator response present.  Labs: CBC    Component Value Date/Time   WBC 9.7 04/02/2022 0928   RBC 3.25 (L) 04/02/2022 0928   HGB 9.0 (L) 04/02/2022 0928   HGB 10.5 (L) 02/20/2022 1117   HCT 30.3 (L) 04/02/2022 0928   PLT 354 04/02/2022 0928   PLT 166 02/20/2022 1117   MCV 93.2 04/02/2022 0928   MCH 27.7 04/02/2022 0928   MCHC 29.7 (L) 04/02/2022  9201   RDW 18.1 (H) 04/02/2022 0928   LYMPHSABS 1.1 02/20/2022 1117   MONOABS 0.5 02/20/2022 1117   EOSABS 0.3 02/20/2022 1117   BASOSABS 0.0 02/20/2022 1117   Absolute eos 01/30/22 - 200     Assessment & Plan:   Discussion: 51 year old active smoker with stage IV adenocarcinoma of the colon s/p chemotherapy s/p right hemicolectomy and COPD who presents for pre-op evaluation. PFTs reviewed and consistent with moderately severe emphysema. Symptoms controlled on ICS/LABA. Counseled against vaping but patient is no longer smoking cigarettes. Discussed clinical course and management of COPD including bronchodilator regimen and action plan for exacerbation.  Emphysema - mild symptoms, overall improved --CONTINUE Breo 200-25 mcg ONE puff ONCE a day. Will call you with results  Tobacco abuse Quit smoking in June 2023 Congratulations!  Health Maintenance Immunization History  Administered Date(s) Administered   Influenza,inj,Quad PF,6+ Mos 08/26/2021   CT Lung Screen - not due. Age 46  No orders of the defined types were placed in this encounter.  No orders of the defined types were placed in this encounter.   Return in about 3 months (around 07/25/2022).  I have spent a total time of 30-minutes on the day of the appointment  including chart review, data review, collecting history, coordinating care and discussing medical diagnosis and plan with the patient/family. Past medical history, allergies, medications were reviewed. Pertinent imaging, labs and tests included in this note have been reviewed and interpreted independently by me.  [place], MD [facility] 04/24/2022 9:12 PM  Office Number [phone]

## 2022-04-24 NOTE — Patient Instructions (Signed)
Full PFT performed today. °

## 2022-04-24 NOTE — Progress Notes (Signed)
Full PFT performed today. °

## 2022-04-24 NOTE — Patient Instructions (Signed)
Emphysema - mild symptoms, overall improved --CONTINUE Breo 200-25 mcg ONE puff ONCE a day.   Tobacco abuse Quit smoking in June 2023 Congratulations!  Follow-up with me in 3 months

## 2022-04-29 ENCOUNTER — Other Ambulatory Visit (HOSPITAL_COMMUNITY): Payer: Self-pay

## 2022-05-05 LAB — PULMONARY FUNCTION TEST
DL/VA % pred: 83 %
DL/VA: 3.61 ml/min/mmHg/L
DLCO cor % pred: 60 %
DLCO cor: 19.8 ml/min/mmHg
DLCO unc % pred: 48 %
DLCO unc: 15.76 ml/min/mmHg
FEF 25-75 Post: 2.21 L/sec
FEF 25-75 Pre: 1.23 L/sec
FEF2575-%Change-Post: 79 %
FEF2575-%Pred-Post: 58 %
FEF2575-%Pred-Pre: 32 %
FEV1-%Change-Post: 13 %
FEV1-%Pred-Post: 57 %
FEV1-%Pred-Pre: 50 %
FEV1-Post: 2.51 L
FEV1-Pre: 2.2 L
FEV1FVC-%Change-Post: 8 %
FEV1FVC-%Pred-Pre: 87 %
FEV6-%Change-Post: 9 %
FEV6-%Pred-Post: 62 %
FEV6-%Pred-Pre: 57 %
FEV6-Post: 3.42 L
FEV6-Pre: 3.13 L
FEV6FVC-%Change-Post: 3 %
FEV6FVC-%Pred-Post: 103 %
FEV6FVC-%Pred-Pre: 99 %
FVC-%Change-Post: 5 %
FVC-%Pred-Post: 60 %
FVC-%Pred-Pre: 57 %
FVC-Post: 3.42 L
FVC-Pre: 3.25 L
Post FEV1/FVC ratio: 73 %
Post FEV6/FVC ratio: 100 %
Pre FEV1/FVC ratio: 68 %
Pre FEV6/FVC Ratio: 96 %
RV % pred: 128 %
RV: 2.96 L
TLC % pred: 76 %
TLC: 5.93 L

## 2022-05-11 ENCOUNTER — Other Ambulatory Visit (HOSPITAL_COMMUNITY): Payer: Self-pay

## 2022-05-11 ENCOUNTER — Other Ambulatory Visit: Payer: Self-pay | Admitting: Oncology

## 2022-05-11 DIAGNOSIS — C189 Malignant neoplasm of colon, unspecified: Secondary | ICD-10-CM

## 2022-05-12 ENCOUNTER — Other Ambulatory Visit (HOSPITAL_COMMUNITY): Payer: Self-pay

## 2022-05-12 MED ORDER — PROCHLORPERAZINE MALEATE 10 MG PO TABS
10.0000 mg | ORAL_TABLET | Freq: Four times a day (QID) | ORAL | 1 refills | Status: DC | PRN
  Filled 2022-05-12: qty 60, 15d supply, fill #0

## 2022-05-12 MED ORDER — POLYETHYLENE GLYCOL 3350 17 G PO PACK
PACK | ORAL | 2 refills | Status: DC
  Filled 2022-05-12: qty 30, 30d supply, fill #0

## 2022-05-13 ENCOUNTER — Other Ambulatory Visit (HOSPITAL_COMMUNITY): Payer: Self-pay

## 2022-05-18 ENCOUNTER — Other Ambulatory Visit: Payer: Self-pay

## 2022-05-18 ENCOUNTER — Other Ambulatory Visit (HOSPITAL_COMMUNITY): Payer: Self-pay

## 2022-05-18 ENCOUNTER — Other Ambulatory Visit: Payer: 59

## 2022-05-18 DIAGNOSIS — B952 Enterococcus as the cause of diseases classified elsewhere: Secondary | ICD-10-CM

## 2022-05-20 ENCOUNTER — Ambulatory Visit: Payer: Self-pay | Admitting: Surgery

## 2022-05-20 NOTE — H&P (Signed)
Eric [name] presents for ongoing follow up after undergoing hepatic metastasectomy and ileostomy reversal on 03/13/22 for a history of stage IV colon cancer. His postop course was complicated by fascial dehiscence, for which he was taken back to the OR by Dr. [name] on 6/12 for placement of a Vicryl mesh bridge. He has completed his course of antibiotics for bacteremia, and had repeat blood cultures drawn yesterday. He has nearly stopped smoking cigarettes but does still sometimes vape. He feels like his breathing is improving. He is eating more and continues to gain weight (up to 71.4 kg today from 69.4 kg a month ago). He is still packing his wound but says it is much smaller and home health RN has stopped placing mepitel in the base as of last week. He is having regular bowel movements, usually two per day.    Physical Examination:   Physical Exam Vitals reviewed.  Constitutional:      General: He is not in acute distress.    Appearance: Normal appearance.  Eyes:     Conjunctiva/sclera: Conjunctivae normal.  Pulmonary:     Effort: Pulmonary effort is normal. No respiratory distress.  Abdominal:     General: There is no distension.     Palpations: Abdomen is soft.     Comments: Midline abdominal wound has pink healthy granulation tissue at the base, Vicryl mesh is no longer visible, wound is much smaller than prior. Remainder of incision is well-healed.  Neurological:     General: No focal deficit present.     Mental Status: He is alert and oriented to person, place, and time.            Assessment and Plan:    Eric [name] is a 51 y.o. male who underwent hepatic metastasectomy and ileostomy reversal on 03/13/22, followed by takeback with placement of Vicryl mesh fascial bridge for fascial dehiscence on 6/12. His nutrition is improving and his wound is healthy and granulating. He should continue applying saline-moistened gauze until closed, but no longer needs mepitel. Will go ahead and  schedule him for port removal, he will be contacted to schedule an elective surgery date. After that I will see him back in 2 months.  Michaelle Birks, [facility], Hepatobiliary and Pancreatic Surgery 05/20/22 3:02 PM

## 2022-05-23 LAB — CULTURE, BLOOD (SINGLE)
MICRO NUMBER:: 13745169
MICRO NUMBER:: 13745170
Result:: NO GROWTH
Result:: NO GROWTH
SPECIMEN QUALITY:: ADEQUATE
SPECIMEN QUALITY:: ADEQUATE

## 2022-05-25 ENCOUNTER — Other Ambulatory Visit (HOSPITAL_COMMUNITY): Payer: Self-pay

## 2022-05-25 ENCOUNTER — Other Ambulatory Visit: Payer: Self-pay | Admitting: Nurse Practitioner

## 2022-05-25 ENCOUNTER — Other Ambulatory Visit: Payer: Self-pay | Admitting: Oncology

## 2022-05-25 DIAGNOSIS — C189 Malignant neoplasm of colon, unspecified: Secondary | ICD-10-CM

## 2022-05-26 ENCOUNTER — Other Ambulatory Visit (HOSPITAL_COMMUNITY): Payer: Self-pay

## 2022-05-26 ENCOUNTER — Other Ambulatory Visit: Payer: Self-pay | Admitting: Nurse Practitioner

## 2022-05-26 DIAGNOSIS — C189 Malignant neoplasm of colon, unspecified: Secondary | ICD-10-CM

## 2022-05-26 MED ORDER — OXYCODONE HCL 5 MG PO TABS
5.0000 mg | ORAL_TABLET | Freq: Two times a day (BID) | ORAL | 0 refills | Status: DC | PRN
  Filled 2022-05-26: qty 30, 15d supply, fill #0

## 2022-05-26 MED ORDER — GABAPENTIN 300 MG PO CAPS
ORAL_CAPSULE | ORAL | 1 refills | Status: DC
  Filled 2022-05-26: qty 90, 30d supply, fill #0
  Filled 2022-08-06: qty 90, 30d supply, fill #1

## 2022-05-28 ENCOUNTER — Inpatient Hospital Stay (HOSPITAL_BASED_OUTPATIENT_CLINIC_OR_DEPARTMENT_OTHER): Payer: 59 | Admitting: Oncology

## 2022-05-28 ENCOUNTER — Inpatient Hospital Stay: Payer: 59 | Attending: Oncology

## 2022-05-28 VITALS — BP 124/90 | HR 71 | Temp 98.1°F | Resp 20 | Ht 74.0 in | Wt 158.0 lb

## 2022-05-28 DIAGNOSIS — Z452 Encounter for adjustment and management of vascular access device: Secondary | ICD-10-CM | POA: Diagnosis present

## 2022-05-28 DIAGNOSIS — C787 Secondary malignant neoplasm of liver and intrahepatic bile duct: Secondary | ICD-10-CM

## 2022-05-28 DIAGNOSIS — C180 Malignant neoplasm of cecum: Secondary | ICD-10-CM | POA: Diagnosis present

## 2022-05-28 DIAGNOSIS — Z95828 Presence of other vascular implants and grafts: Secondary | ICD-10-CM

## 2022-05-28 DIAGNOSIS — C189 Malignant neoplasm of colon, unspecified: Secondary | ICD-10-CM | POA: Diagnosis not present

## 2022-05-28 MED ORDER — SODIUM CHLORIDE 0.9% FLUSH
10.0000 mL | Freq: Once | INTRAVENOUS | Status: AC
  Administered 2022-05-28: 10 mL via INTRAVENOUS

## 2022-05-28 MED ORDER — HEPARIN SOD (PORK) LOCK FLUSH 100 UNIT/ML IV SOLN
500.0000 [IU] | Freq: Once | INTRAVENOUS | Status: AC
  Administered 2022-05-28: 500 [IU] via INTRAVENOUS

## 2022-05-28 NOTE — Patient Instructions (Signed)

## 2022-05-28 NOTE — Progress Notes (Signed)
[facility] OFFICE PROGRESS NOTE   Diagnosis: Colon cancer  INTERVAL HISTORY:   Eric [name] returns as scheduled.  He feels well.  Neuropathy symptoms have significantly improved.  The abdominal wound is healing.  He takes oxycodone as needed for abdominal pain.  He has continued cigarettes.  He is vaping.  Repeat blood cultures on 05/18/2022 were negative.  He has been scheduled for Port-A-Cath removal.  Objective:  Vital signs in last 24 hours:  Blood pressure (!) 124/90, pulse 71, temperature 98.1 F (36.7 C), temperature source Oral, resp. rate 20, height 6' 2" (1.88 m), weight 158 lb (71.7 kg), SpO2 100 %.    Lymphatics: No cervical, supraclavicular, or inguinal nodes.  Soft mobile 1/2-1 cm bilateral axillary nodes Resp: Distant breath sounds, scattered end inspiratory rhonchi, no respiratory distress Cardio: Regular rate and rhythm GI: No hepatosplenomegaly, small hernia at the previous ostomy site, superficial opening at the midline wound with a packing in place Vascular: No leg edema    Portacath/PICC-without erythema  Lab Results:  Lab Results  Component Value Date   WBC 9.7 04/02/2022   HGB 9.0 (L) 04/02/2022   HCT 30.3 (L) 04/02/2022   MCV 93.2 04/02/2022   PLT 354 04/02/2022   NEUTROABS 3.7 02/20/2022    CMP  Lab Results  Component Value Date   NA 133 (L) 04/02/2022   K 4.5 04/02/2022   CL 101 04/02/2022   CO2 23 04/02/2022   GLUCOSE 110 (H) 04/02/2022   BUN 18 04/02/2022   CREATININE 0.76 04/03/2022   CALCIUM 8.7 (L) 04/02/2022   PROT 6.8 04/02/2022   ALBUMIN 2.2 (L) 04/02/2022   AST 42 (H) 04/02/2022   ALT 63 (H) 04/02/2022   ALKPHOS 285 (H) 04/02/2022   BILITOT 0.5 04/02/2022   GFRNONAA >60 04/03/2022    Lab Results  Component Value Date   CEA1 0.8 11/19/2020   CEA 2.47 06/09/2021     Medications: I have reviewed the patient's current medications.   Assessment/Plan: Stage IV adenocarcinoma of the colon -11/15/2020 CT  abdomen/pelvis with contrast-long segment masslike mural thickening of the cecum, posterior perforation at the site of the cecal wall thickening with multilocular right retroperitoneal abscess, indeterminate hypodensity in the inferior right lobe of the liver. -11/19/2020 CEA 0.8 -11/20/2020 MRI of the abdomen and pelvis with and without contrast-3.5 x 2.5 cm heterogeneous enhancing lesion in the inferior peripheral right liver with 2 smaller lesions with similar features. -11/20/2020 ultrasound-guided liver biopsy consistent with adenocarcinoma.  Foundation 1-MSS, tumor mutation burden 8,KRAS wt -11/25/2020 CT abdomen/pelvis with contrast-interval placement of a right lower quadrant percutaneous pigtail drainage catheter with near resolution of the retroperitoneal fluid collection, redemonstrated large fungating mass at the cecal base measuring 11.9 x 9.1 x 8.4 cm with enlarged lymph nodes in the right lower quadrant mesocolon. -11/28/2020 exploratory laparotomy, right colectomy, ileostomy creation, cystoscopy with stent placement             -Surgical pathology showed adenocarcinoma, moderately differentiated, 12 cm, 0/14+ lymph nodes, lymphovascular space invasion present, radial margin involved by invasive adenocarcinoma, appendix with intraluminal             adenocarcinoma, pT4, PN 0 -12/09/2020 CT abdomen/pelvis with contrast-interval right colectomy and right lower quadrant ileostomy, stable right retroperitoneal soft tissue density along the psoas and iliacus muscles, mild progression of liver metastases since prior study. -Cycle 1 FOLFOX 12/24/2020 -Cycle 2 FOLFOX 01/06/2021 -Cycle 3 FOLFOX 01/20/2021 -Cycle 4 FOLFOX 02/03/2021 -Cycle 5 FOLFOX 02/17/2021 -  Cycle 6 FOLFOX 03/03/2021 -Restaging CTs 03/05/2021- substantial interval decrease in size of predominantly hypodense lesions throughout the liver.  Significant interval resolution of previously seen extensive, heterogeneous soft tissue in the right  retroperitoneal soft tissues and paracolic gutter overlying the psoas and iliacus muscles. -Cycle 7 FOLFOX 03/19/2021 -Cycle 8 FOLFOX 04/03/2021, oxaliplatin dose reduced secondary to neutropenia and thrombocytopenia -Cycle 9 FOLFOX 04/15/2021 -Cycle 10 FOLFOX 04/29/2021 -Cycle 11 FOLFOX 05/13/2021, oxaliplatin held secondary to neuropathy -Cycle 12 FOLFOX 05/27/2021, oxaliplatin held secondary to neuropathy -CT abdomen/pelvis 06/09/2021-decrease size of remaining dominant liver lesion, other liver lesions are calcified and punctate, subtle density in the posterior right bladder, unchanged sclerotic right iliac lesion -Cycle 13 FOLFOX 06/10/2021, oxaliplatin held secondary to neuropathy -Maintenance Xeloda 06/25/2021 -MRI liver 07/03/2021-no significant change in subtle irregular nonenhancing remnant liver lesions of the inferior right lobe of the liver measuring 1.2 x 0.6 cm and adjacent to the gallbladder fossa measuring no greater than 0.5 cm. -Maintenance Xeloda continued -Tumor conference 08/06/2021-rescan at a 69-monthinterval -Xeloda decreased to 1000 mg a.m., 500 mg p.m. 08/05/2021 (due to hand-foot syndrome) -MRI liver 10/31/2021-treated liver lesion within the inferior right hepatic lobe measuring 0.9 cm.  Small treated lesion within the posterior medial right hepatic lobe measuring 5 mm, unchanged. -Maintenance Xeloda continued -Xeloda discontinued 02/20/2022 -03/13/2022 hepatic wedge resection x2 and ileostomy reversal (nodule left liver bile duct hamartoma; nodule left liver #2 bile duct hamartoma; segment 5 liver mass benign fibrotic and calcified nodule, background liver parenchyma with features of nodular regenerative hyperplasia, no evidence of malignancy; segment 4 liver mass small incidental bile duct hamartoma, background liver parenchyma with features of nodular regenerative hyperplasia, no evidence of malignancy)   2.  Sepsis secondary to perforated colon 3.  Retroperitoneal abscess 4.   Protein calorie malnutrition 5.  Thrombocytosis 6.  Iron deficiency anemia 7.  COVID-19 infection, 11/15/2020 8.  Tobacco abuse 9.  Family history of colon cancer 10.  Port-A-Cath placement, Dr. 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  Admission 09/22/2021 with dehydration and probable COPD flare 13.  Enterococcal bacteremia 03/27/2022, completing outpatient Augmentin, followed by ID       Disposition: Eric [name] in clinical remission from colon cancer.  The abdominal wound continues to heal following the June hepatic resection and ileostomy takedown.  He is being scheduled for Port-A-Cath removal by Dr. AZenia Resides  Eric [name] return for an office visit and CEA in 3 months.  GBetsy Coder MD  05/28/2022  3:12 PM

## 2022-05-29 ENCOUNTER — Other Ambulatory Visit (HOSPITAL_COMMUNITY): Payer: Self-pay

## 2022-06-03 ENCOUNTER — Other Ambulatory Visit (HOSPITAL_COMMUNITY): Payer: Self-pay

## 2022-06-14 ENCOUNTER — Other Ambulatory Visit: Payer: Self-pay | Admitting: Oncology

## 2022-06-14 DIAGNOSIS — C189 Malignant neoplasm of colon, unspecified: Secondary | ICD-10-CM

## 2022-06-16 MED ORDER — PANTOPRAZOLE SODIUM 40 MG PO TBEC
40.0000 mg | DELAYED_RELEASE_TABLET | Freq: Every day | ORAL | 5 refills | Status: AC
  Filled 2022-06-16: qty 30, 30d supply, fill #0
  Filled 2022-08-06: qty 30, 30d supply, fill #1

## 2022-06-17 ENCOUNTER — Other Ambulatory Visit (HOSPITAL_COMMUNITY): Payer: Self-pay

## 2022-06-19 IMAGING — CT CT ABD-PELV W/ CM
2 of 5 series · 15 of 46 positions shown, 17 images · IV contrast (APPLIED)
Comparison: 12/09/2020

CLINICAL DATA: Metastatic colon cancer restaging

EXAM:
CT ABDOMEN AND PELVIS WITH CONTRAST
TECHNIQUE: Multidetector CT imaging of the abdomen and pelvis was performed
using the standard protocol following bolus administration of
intravenous contrast.
CONTRAST:  75mL OMNIPAQUE IOHEXOL 300 MG/ML SOLN, additional oral
enteric contrast

[Series 2: abd pel w · axial · 0.70mm/px · z∈[+981,+1386]mm · 12 of 91 slices shown, 14 images]
[im 5/91  soft-tissue]
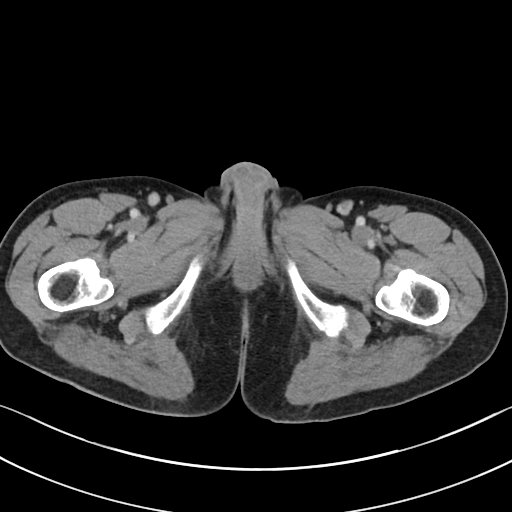
[im 5/91  bone]
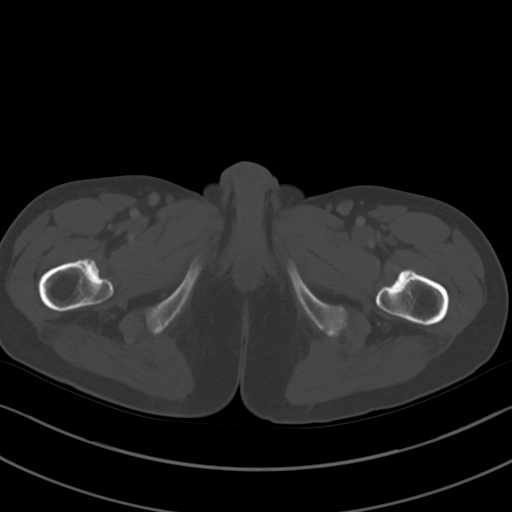
[im 15/91  soft-tissue]
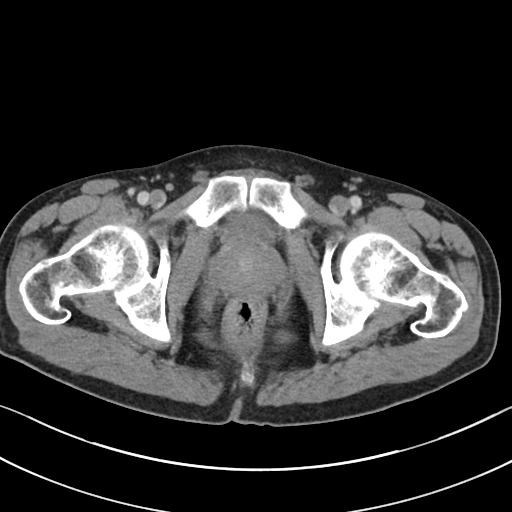
[im 19/91  soft-tissue]
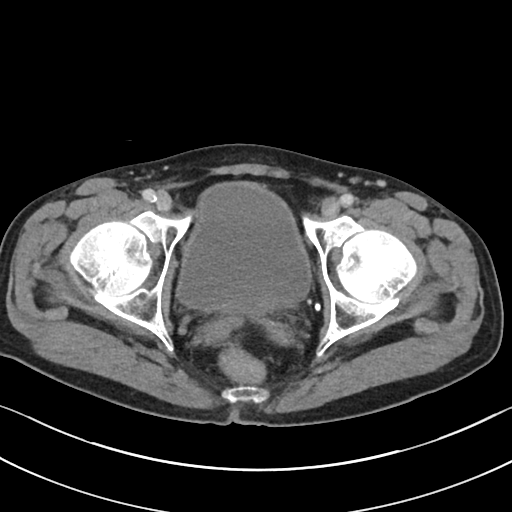
[im 29/91  soft-tissue]
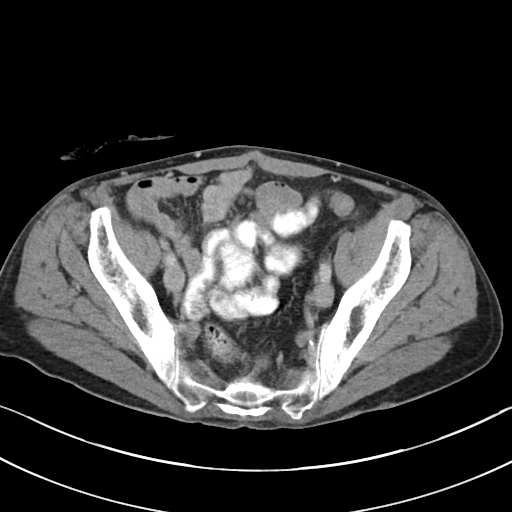
[im 34/91  soft-tissue]
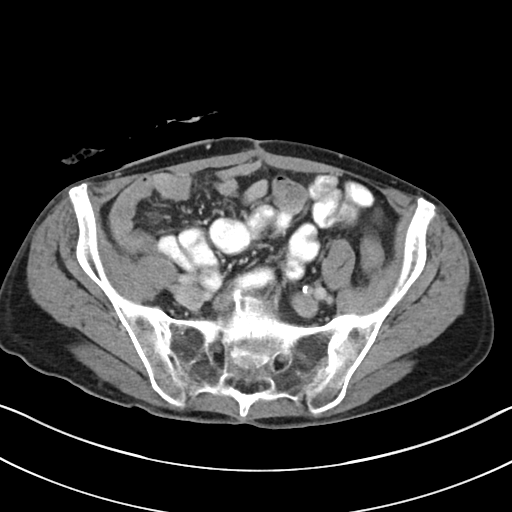
[im 43/91  soft-tissue]
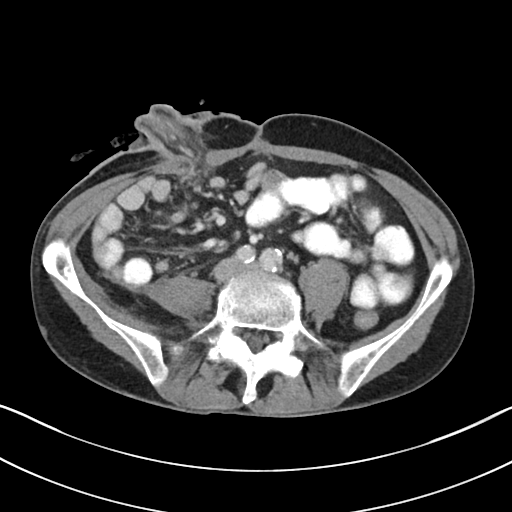
[im 48/91  soft-tissue]
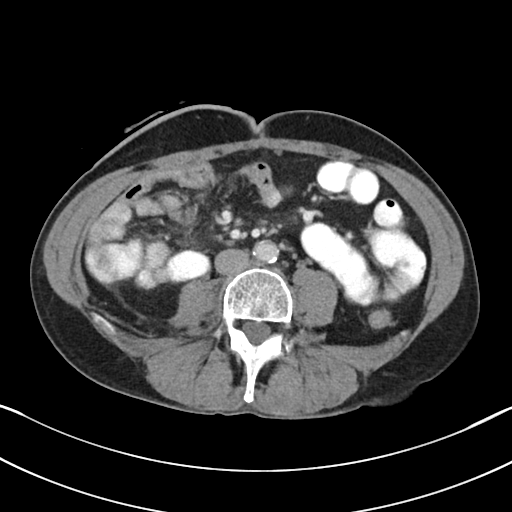
[im 57/91  soft-tissue]
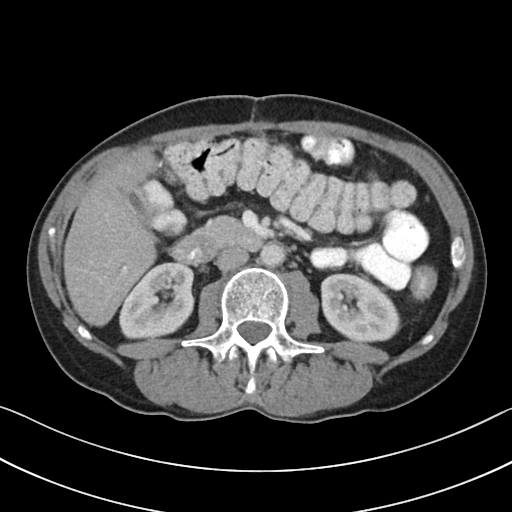
[im 62/91  soft-tissue]
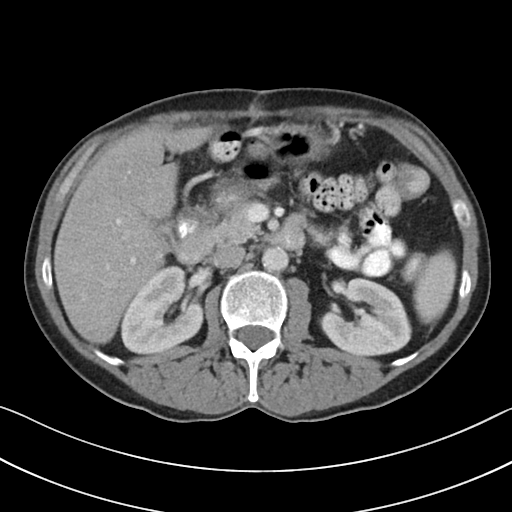
[im 62/91  bone]
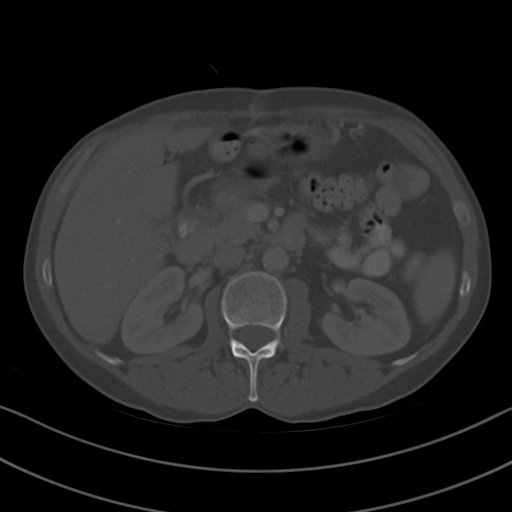
[im 72/91  soft-tissue]
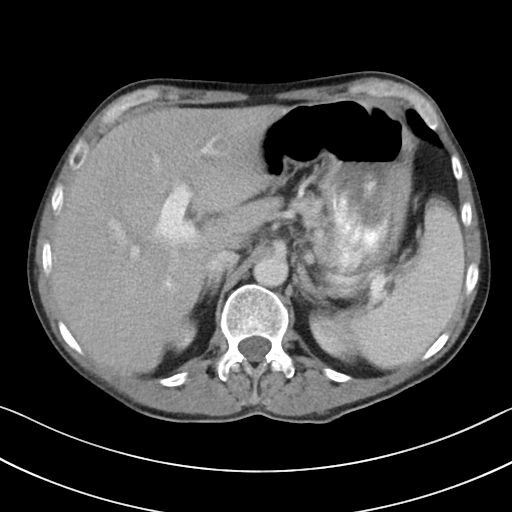
[im 76/91  soft-tissue]
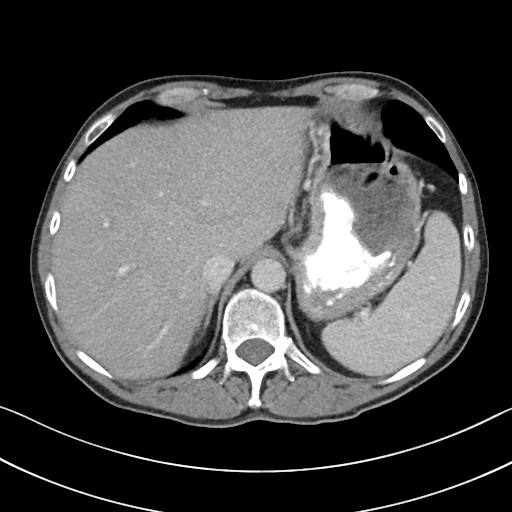
[im 86/91  soft-tissue]
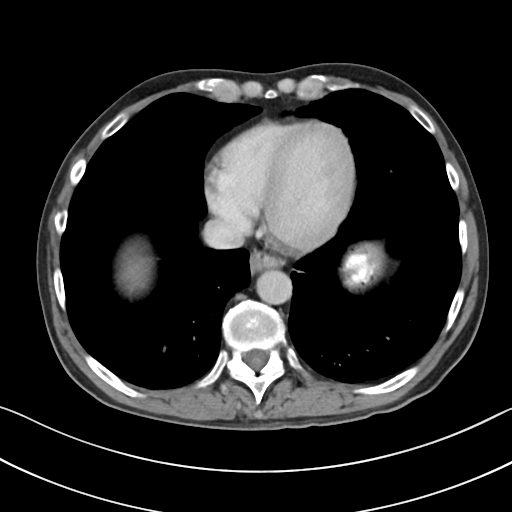

[Series 5: coronal · coronal · 0.73mm/px · 3 of 89 slices shown]
[im 30/89  soft-tissue]
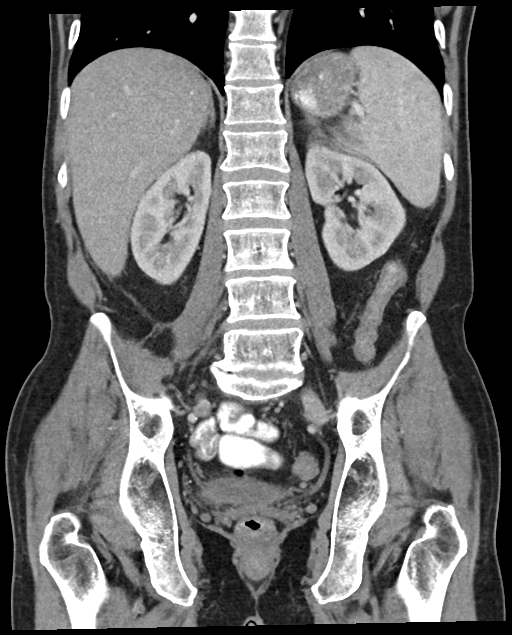
[im 40/89  soft-tissue]
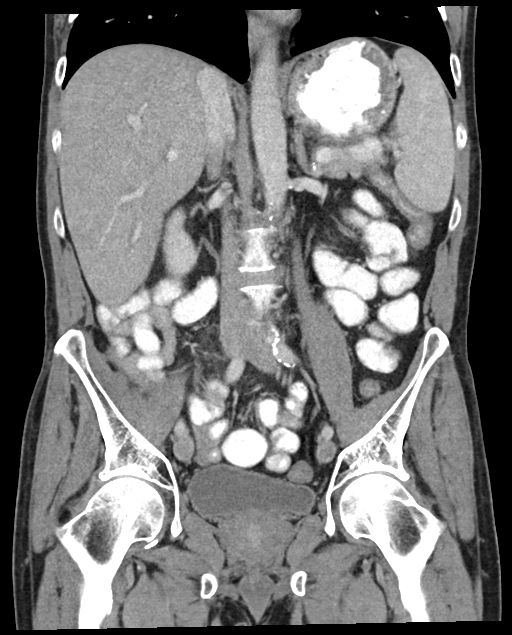
[im 49/89  soft-tissue]
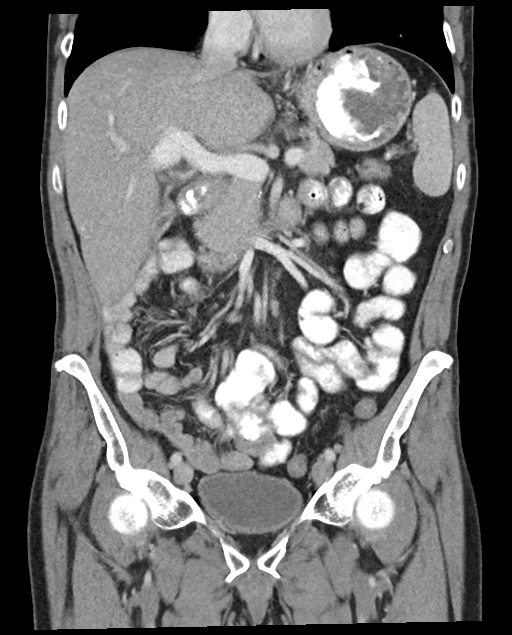

[15 of 46 positions shown; findings below may reference images not displayed]

FINDINGS: Lower chest: No acute abnormality.

Hepatobiliary: Substantial interval decrease in size of
predominantly hypodense lesions throughout the liver, with some
internal contrast hyperenhancement. An index lesion of the anterior
inferior right lobe of the liver, hepatic segment VI, measures 1.4 x
0.9 cm, previously 4.3 x 2.3 cm (series 2, image 33). A small,
slightly hyperenhancing lesion adjacent to the gallbladder fossa,
hepatic segment IVB, measures 0.6 cm, previously 2.3 x 2.0 (series
2, image 33). Contracted gallbladder. No gallstones, gallbladder
wall thickening, or biliary dilatation.

Pancreas: Unremarkable. No pancreatic ductal dilatation or
surrounding inflammatory changes.

Spleen: Normal in size without significant abnormality.

Adrenals/Urinary Tract: Adrenal glands are unremarkable. Kidneys are
normal, without renal calculi, solid lesion, or hydronephrosis.
Bladder is unremarkable.

Stomach/Bowel: Stomach is within normal limits. Redemonstrated
postoperative findings of partial right colectomy with diverting
ileostomy.

Vascular/Lymphatic: Aortic atherosclerosis. No enlarged abdominal or
pelvic lymph nodes.

Reproductive: Mild prostatomegaly.

Other: No abdominal wall hernia or abnormality. No abdominopelvic
ascites. There has been significant interval resolution of
previously seen extensive, heterogeneous soft tissue in the right
retroperitoneal soft tissues and paracolic gutter overlying the
psoas and iliacus muscles (series 2, image 47).

Musculoskeletal: No acute or significant osseous findings.
IMPRESSION: 1. Substantial interval decrease in size of predominantly hypodense
lesions throughout the liver, consistent with treatment response of
hepatic metastatic disease.
2. There has been significant interval resolution of previously seen
extensive, heterogeneous soft tissue in the right retroperitoneal
soft tissues and paracolic gutter overlying the psoas and iliacus
muscles. This may reflect either or both of resolution of
postoperative hematoma and improvement in soft tissue metastatic
disease.
3. Redemonstrated postoperative findings of partial right colectomy
with diverting ileostomy.

Aortic Atherosclerosis ([id number].S).

## 2022-07-27 ENCOUNTER — Ambulatory Visit: Payer: 59 | Admitting: Pulmonary Disease

## 2022-07-31 ENCOUNTER — Other Ambulatory Visit (HOSPITAL_COMMUNITY): Payer: Self-pay

## 2022-08-06 ENCOUNTER — Other Ambulatory Visit: Payer: Self-pay | Admitting: Nurse Practitioner

## 2022-08-06 ENCOUNTER — Other Ambulatory Visit (HOSPITAL_COMMUNITY): Payer: Self-pay

## 2022-08-06 ENCOUNTER — Other Ambulatory Visit: Payer: Self-pay | Admitting: Pulmonary Disease

## 2022-08-06 DIAGNOSIS — C189 Malignant neoplasm of colon, unspecified: Secondary | ICD-10-CM

## 2022-08-06 MED ORDER — ONDANSETRON HCL 8 MG PO TABS
8.0000 mg | ORAL_TABLET | Freq: Three times a day (TID) | ORAL | 2 refills | Status: AC | PRN
  Filled 2022-08-06: qty 30, 10d supply, fill #0

## 2022-08-06 MED ORDER — ALBUTEROL SULFATE HFA 108 (90 BASE) MCG/ACT IN AERS
2.0000 | INHALATION_SPRAY | Freq: Four times a day (QID) | RESPIRATORY_TRACT | 0 refills | Status: AC | PRN
  Filled 2022-08-06: qty 6.7, 25d supply, fill #0

## 2022-08-06 NOTE — Telephone Encounter (Signed)
Called the pt and there was no answer- LMTCB    

## 2022-08-06 NOTE — Telephone Encounter (Signed)
Please schedule for follow-up appointment with NP or me for further refills

## 2022-08-07 ENCOUNTER — Other Ambulatory Visit (HOSPITAL_COMMUNITY): Payer: Self-pay

## 2022-08-28 ENCOUNTER — Encounter: Payer: Self-pay | Admitting: Nurse Practitioner

## 2022-08-28 ENCOUNTER — Inpatient Hospital Stay: Payer: Medicaid Other | Attending: Oncology

## 2022-08-28 ENCOUNTER — Inpatient Hospital Stay (HOSPITAL_BASED_OUTPATIENT_CLINIC_OR_DEPARTMENT_OTHER): Payer: Medicaid Other | Admitting: Nurse Practitioner

## 2022-08-28 VITALS — BP 136/99 | HR 96 | Temp 98.2°F | Resp 18 | Ht 74.0 in | Wt 159.4 lb

## 2022-08-28 DIAGNOSIS — C180 Malignant neoplasm of cecum: Secondary | ICD-10-CM | POA: Insufficient documentation

## 2022-08-28 DIAGNOSIS — A419 Sepsis, unspecified organism: Secondary | ICD-10-CM | POA: Insufficient documentation

## 2022-08-28 DIAGNOSIS — Q859 Phakomatosis, unspecified: Secondary | ICD-10-CM | POA: Insufficient documentation

## 2022-08-28 DIAGNOSIS — Z95828 Presence of other vascular implants and grafts: Secondary | ICD-10-CM

## 2022-08-28 DIAGNOSIS — G620 Drug-induced polyneuropathy: Secondary | ICD-10-CM | POA: Insufficient documentation

## 2022-08-28 DIAGNOSIS — Z79899 Other long term (current) drug therapy: Secondary | ICD-10-CM | POA: Insufficient documentation

## 2022-08-28 DIAGNOSIS — D75839 Thrombocytosis, unspecified: Secondary | ICD-10-CM | POA: Insufficient documentation

## 2022-08-28 DIAGNOSIS — Z8616 Personal history of COVID-19: Secondary | ICD-10-CM | POA: Insufficient documentation

## 2022-08-28 DIAGNOSIS — Z9049 Acquired absence of other specified parts of digestive tract: Secondary | ICD-10-CM | POA: Insufficient documentation

## 2022-08-28 DIAGNOSIS — E46 Unspecified protein-calorie malnutrition: Secondary | ICD-10-CM | POA: Insufficient documentation

## 2022-08-28 DIAGNOSIS — Z800 Family history of malignant neoplasm of digestive organs: Secondary | ICD-10-CM | POA: Insufficient documentation

## 2022-08-28 DIAGNOSIS — K631 Perforation of intestine (nontraumatic): Secondary | ICD-10-CM | POA: Insufficient documentation

## 2022-08-28 DIAGNOSIS — D509 Iron deficiency anemia, unspecified: Secondary | ICD-10-CM | POA: Insufficient documentation

## 2022-08-28 DIAGNOSIS — C787 Secondary malignant neoplasm of liver and intrahepatic bile duct: Secondary | ICD-10-CM | POA: Insufficient documentation

## 2022-08-28 DIAGNOSIS — D696 Thrombocytopenia, unspecified: Secondary | ICD-10-CM | POA: Insufficient documentation

## 2022-08-28 DIAGNOSIS — T451X5A Adverse effect of antineoplastic and immunosuppressive drugs, initial encounter: Secondary | ICD-10-CM | POA: Insufficient documentation

## 2022-08-28 DIAGNOSIS — C189 Malignant neoplasm of colon, unspecified: Secondary | ICD-10-CM

## 2022-08-28 DIAGNOSIS — K6819 Other retroperitoneal abscess: Secondary | ICD-10-CM | POA: Insufficient documentation

## 2022-08-28 LAB — CEA (ACCESS): CEA (CHCC): 1.12 ng/mL (ref 0.00–5.00)

## 2022-08-28 MED ORDER — HEPARIN SOD (PORK) LOCK FLUSH 100 UNIT/ML IV SOLN
500.0000 [IU] | Freq: Once | INTRAVENOUS | Status: AC
  Administered 2022-08-28: 500 [IU] via INTRAVENOUS

## 2022-08-28 MED ORDER — SODIUM CHLORIDE 0.9% FLUSH
10.0000 mL | Freq: Once | INTRAVENOUS | Status: AC
  Administered 2022-08-28: 10 mL via INTRAVENOUS

## 2022-08-28 NOTE — Progress Notes (Signed)
[name]   Diagnosis: Colon cancer  INTERVAL HISTORY:   [name] returns as scheduled.  He feels well.  Bowels moving regularly overall.  No rectal bleeding.  He has a good appetite.  He reports being diagnosed with shingles 2 weeks ago.  Lesions are resolving.  Objective:  Vital signs in last 24 hours:  Blood pressure (!) 136/99, pulse 96, temperature 98.2 F (36.8 C), temperature source Oral, resp. rate 18, height _0  (1.88 m), weight 159 lb 6.4 oz (72.3 kg), SpO2 98 %.    Lymphatics: No palpable cervical, supraclavicular, axillary or inguinal lymph nodes. Resp: Distant breath sounds.  No respiratory distress. Cardio: Regular rate and rhythm. GI: No hepatosplenomegaly.  Superficial opening midline wound, otherwise healed. Vascular: No leg edema. Port-A-Cath without erythema.  Lab Results:  Lab Results  Component Value Date   WBC 9.7 04/02/2022   HGB 9.0 (L) 04/02/2022   HCT 30.3 (L) 04/02/2022   MCV 93.2 04/02/2022   PLT 354 04/02/2022   NEUTROABS 3.7 02/20/2022    Imaging:  No results found.  Medications: I have reviewed the patient's current medications.  Assessment/Plan: Stage IV adenocarcinoma of the colon -11/15/2020 CT abdomen/pelvis with contrast-long segment masslike mural thickening of the cecum, posterior perforation at the site of the cecal wall thickening with multilocular right retroperitoneal abscess, indeterminate hypodensity in the inferior right lobe of the liver. -11/19/2020 CEA 0.8 -11/20/2020 MRI of the abdomen and pelvis with and without contrast-3.5 x 2.5 cm heterogeneous enhancing lesion in the inferior peripheral right liver with 2 smaller lesions with similar features. -11/20/2020 ultrasound-guided liver biopsy consistent with adenocarcinoma.  Foundation 1-MSS, tumor mutation burden 8,KRAS wt -11/25/2020 CT abdomen/pelvis with contrast-interval placement of a right lower quadrant percutaneous pigtail drainage  catheter with near resolution of the retroperitoneal fluid collection, redemonstrated large fungating mass at the cecal base measuring 11.9 x 9.1 x 8.4 cm with enlarged lymph nodes in the right lower quadrant mesocolon. -11/28/2020 exploratory laparotomy, right colectomy, ileostomy creation, cystoscopy with stent placement             -Surgical pathology showed adenocarcinoma, moderately differentiated, 12 cm, 0/14+ lymph nodes, lymphovascular space invasion present, radial margin involved by invasive adenocarcinoma, appendix with intraluminal             adenocarcinoma, pT4, PN 0 -12/09/2020 CT abdomen/pelvis with contrast-interval right colectomy and right lower quadrant ileostomy, stable right retroperitoneal soft tissue density along the psoas and iliacus muscles, mild progression of liver metastases since prior study. -Cycle 1 FOLFOX 12/24/2020 -Cycle 2 FOLFOX 01/06/2021 -Cycle 3 FOLFOX 01/20/2021 -Cycle 4 FOLFOX 02/03/2021 -Cycle 5 FOLFOX 02/17/2021 -Cycle 6 FOLFOX 03/03/2021 -Restaging CTs 03/05/2021- substantial interval decrease in size of predominantly hypodense lesions throughout the liver.  Significant interval resolution of previously seen extensive, heterogeneous soft tissue in the right retroperitoneal soft tissues and paracolic gutter overlying the psoas and iliacus muscles. -Cycle 7 FOLFOX 03/19/2021 -Cycle 8 FOLFOX 04/03/2021, oxaliplatin dose reduced secondary to neutropenia and thrombocytopenia -Cycle 9 FOLFOX 04/15/2021 -Cycle 10 FOLFOX 04/29/2021 -Cycle 11 FOLFOX 05/13/2021, oxaliplatin held secondary to neuropathy -Cycle 12 FOLFOX 05/27/2021, oxaliplatin held secondary to neuropathy -CT abdomen/pelvis 06/09/2021-decrease size of remaining dominant liver lesion, other liver lesions are calcified and punctate, subtle density in the posterior right bladder, unchanged sclerotic right iliac lesion -Cycle 13 FOLFOX 06/10/2021, oxaliplatin held secondary to neuropathy -Maintenance Xeloda  06/25/2021 -MRI liver 07/03/2021-no significant change in subtle irregular nonenhancing remnant liver lesions of the inferior right  lobe of the liver measuring 1.2 x 0.6 cm and adjacent to the gallbladder fossa measuring no greater than 0.5 cm. -Maintenance Xeloda continued -Tumor conference 08/06/2021-rescan at a 62-monthinterval -Xeloda decreased to 1000 mg a.m., 500 mg p.m. 08/05/2021 (due to hand-foot syndrome) -MRI liver 10/31/2021-treated liver lesion within the inferior right hepatic lobe measuring 0.9 cm.  Small treated lesion within the posterior medial right hepatic lobe measuring 5 mm, unchanged. -Maintenance Xeloda continued -Xeloda discontinued 02/20/2022 -03/13/2022 hepatic wedge resection x2 and ileostomy reversal (nodule left liver bile duct hamartoma; nodule left liver #2 bile duct hamartoma; segment 5 liver mass benign fibrotic and calcified nodule, background liver parenchyma with features of nodular regenerative hyperplasia, no evidence of malignancy; segment 4 liver mass small incidental bile duct hamartoma, background liver parenchyma with features of nodular regenerative hyperplasia, no evidence of malignancy)   2.  Sepsis secondary to perforated colon 3.  Retroperitoneal abscess 4.  Protein calorie malnutrition 5.  Thrombocytosis 6.  Iron deficiency anemia 7.  COVID-19 infection, 11/15/2020 8.  Tobacco abuse 9.  Family history of colon cancer 10.  Port-A-Cath placement, Dr. 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  Admission 09/22/2021 with dehydration and probable COPD flare 13.  Enterococcal bacteremia 03/27/2022, completing outpatient Augmentin, followed by ID      Disposition: Mr. HHeatwoleappears stable.  There is no clinical evidence for disease progression.  We will follow-up on the CEA from today.  Plan for CTs in approximately 3 months.  He will return for a port flush in 6 weeks.  We  will see him in follow-up in 3 months, a few days after CT scans.  He will contact the office in the interim with any problems.    LNed CardANP/GNP-BC   08/28/2022  1:17 PM

## 2022-08-31 ENCOUNTER — Other Ambulatory Visit (HOSPITAL_COMMUNITY): Payer: Self-pay

## 2022-09-11 DIAGNOSIS — Z419 Encounter for procedure for purposes other than remedying health state, unspecified: Secondary | ICD-10-CM | POA: Diagnosis not present

## 2022-10-09 ENCOUNTER — Inpatient Hospital Stay: Payer: Medicaid Other | Attending: Oncology

## 2022-10-09 VITALS — BP 129/103 | HR 114 | Temp 98.4°F | Resp 20 | Ht 74.0 in | Wt 165.6 lb

## 2022-10-09 DIAGNOSIS — Z85038 Personal history of other malignant neoplasm of large intestine: Secondary | ICD-10-CM | POA: Insufficient documentation

## 2022-10-09 DIAGNOSIS — Z452 Encounter for adjustment and management of vascular access device: Secondary | ICD-10-CM | POA: Insufficient documentation

## 2022-10-09 DIAGNOSIS — Z95828 Presence of other vascular implants and grafts: Secondary | ICD-10-CM

## 2022-10-09 MED ORDER — HEPARIN SOD (PORK) LOCK FLUSH 100 UNIT/ML IV SOLN
500.0000 [IU] | Freq: Once | INTRAVENOUS | Status: AC
  Administered 2022-10-09: 500 [IU] via INTRAVENOUS

## 2022-10-09 MED ORDER — SODIUM CHLORIDE 0.9% FLUSH
10.0000 mL | Freq: Once | INTRAVENOUS | Status: AC
  Administered 2022-10-09: 10 mL via INTRAVENOUS

## 2022-10-09 NOTE — Patient Instructions (Signed)

## 2022-10-12 DIAGNOSIS — Z419 Encounter for procedure for purposes other than remedying health state, unspecified: Secondary | ICD-10-CM | POA: Diagnosis not present

## 2022-11-03 ENCOUNTER — Other Ambulatory Visit: Payer: Self-pay | Admitting: Nurse Practitioner

## 2022-11-03 DIAGNOSIS — C189 Malignant neoplasm of colon, unspecified: Secondary | ICD-10-CM

## 2022-11-04 ENCOUNTER — Other Ambulatory Visit (HOSPITAL_COMMUNITY): Payer: Self-pay

## 2022-11-04 ENCOUNTER — Other Ambulatory Visit: Payer: Self-pay

## 2022-11-04 MED ORDER — GABAPENTIN 300 MG PO CAPS
300.0000 mg | ORAL_CAPSULE | Freq: Three times a day (TID) | ORAL | 1 refills | Status: AC
  Filled 2022-11-04: qty 90, 30d supply, fill #0

## 2022-11-12 DIAGNOSIS — Z419 Encounter for procedure for purposes other than remedying health state, unspecified: Secondary | ICD-10-CM | POA: Diagnosis not present

## 2022-11-27 ENCOUNTER — Inpatient Hospital Stay: Payer: Medicaid Other | Attending: Oncology

## 2022-11-27 ENCOUNTER — Inpatient Hospital Stay: Payer: Medicaid Other

## 2022-11-27 ENCOUNTER — Ambulatory Visit (HOSPITAL_BASED_OUTPATIENT_CLINIC_OR_DEPARTMENT_OTHER)
Admission: RE | Admit: 2022-11-27 | Discharge: 2022-11-27 | Disposition: A | Discharge status: Home / Self Care | Payer: Medicaid Other | Source: Ambulatory Visit | Attending: Nurse Practitioner | Admitting: Nurse Practitioner

## 2022-11-27 DIAGNOSIS — C787 Secondary malignant neoplasm of liver and intrahepatic bile duct: Secondary | ICD-10-CM | POA: Insufficient documentation

## 2022-11-27 DIAGNOSIS — J439 Emphysema, unspecified: Secondary | ICD-10-CM | POA: Diagnosis not present

## 2022-11-27 DIAGNOSIS — C189 Malignant neoplasm of colon, unspecified: Secondary | ICD-10-CM | POA: Insufficient documentation

## 2022-11-27 DIAGNOSIS — Z23 Encounter for immunization: Secondary | ICD-10-CM | POA: Insufficient documentation

## 2022-11-27 DIAGNOSIS — K3189 Other diseases of stomach and duodenum: Secondary | ICD-10-CM | POA: Diagnosis not present

## 2022-11-27 DIAGNOSIS — K7689 Other specified diseases of liver: Secondary | ICD-10-CM | POA: Diagnosis not present

## 2022-11-27 LAB — BASIC METABOLIC PANEL - CANCER CENTER ONLY
Anion gap: 8 (ref 5–15)
BUN: 14 mg/dL (ref 6–20)
CO2: 27 mmol/L (ref 22–32)
Calcium: 9.3 mg/dL (ref 8.9–10.3)
Chloride: 105 mmol/L (ref 98–111)
Creatinine: 0.89 mg/dL (ref 0.61–1.24)
GFR, Estimated: >60 mL/min (ref >60)
Glucose, Bld: 138 mg/dL — ABNORMAL HIGH (ref 70–99)
Potassium: 3.9 mmol/L (ref 3.5–5.1)
Sodium: 140 mmol/L (ref 135–145)

## 2022-11-27 LAB — CEA (ACCESS): CEA (CHCC): <1 ng/mL (ref 0.00–5.00)

## 2022-11-27 MED ORDER — IOHEXOL 300 MG/ML  SOLN
100.0000 mL | Freq: Once | INTRAMUSCULAR | Status: AC | PRN
  Administered 2022-11-27: 100 mL via INTRAVENOUS

## 2022-11-27 NOTE — Patient Instructions (Signed)

## 2022-11-30 ENCOUNTER — Inpatient Hospital Stay (HOSPITAL_BASED_OUTPATIENT_CLINIC_OR_DEPARTMENT_OTHER): Payer: Medicaid Other | Admitting: Oncology

## 2022-11-30 ENCOUNTER — Inpatient Hospital Stay: Payer: Medicaid Other

## 2022-11-30 VITALS — BP 133/68 | HR 102 | Temp 98.1°F | Resp 18 | Ht 74.0 in | Wt 163.0 lb

## 2022-11-30 DIAGNOSIS — Z95828 Presence of other vascular implants and grafts: Secondary | ICD-10-CM

## 2022-11-30 DIAGNOSIS — Z23 Encounter for immunization: Secondary | ICD-10-CM | POA: Diagnosis not present

## 2022-11-30 DIAGNOSIS — C787 Secondary malignant neoplasm of liver and intrahepatic bile duct: Secondary | ICD-10-CM | POA: Diagnosis not present

## 2022-11-30 DIAGNOSIS — C189 Malignant neoplasm of colon, unspecified: Secondary | ICD-10-CM

## 2022-11-30 MED ORDER — INFLUENZA VAC SPLIT QUAD 0.5 ML IM SUSY
0.5000 mL | PREFILLED_SYRINGE | Freq: Once | INTRAMUSCULAR | Status: AC
  Administered 2022-11-30: 0.5 mL via INTRAMUSCULAR
  Filled 2022-11-30: qty 0.5

## 2022-11-30 NOTE — Progress Notes (Signed)
[facility] OFFICE PROGRESS NOTE   Diagnosis: Colon cancer  INTERVAL HISTORY:   Mr. Aase returns as scheduled.  He is here with his sister.  Feels well.  Good appetite.  He is no longer smoking.  He "vapes ".  He continues to have neuropathy symptoms in the extremities.  He is not taking pain medication.  His bowels are functioning.  Objective:  Vital signs in last 24 hours:  Blood pressure 133/68, pulse (!) 102, temperature 98.1 F (36.7 C), temperature source Oral, resp. rate 18, height 6' 2"$  (1.88 m), weight 163 lb (73.9 kg), SpO2 98 %.    Lymphatics: No cervical, supraclavicular, or inguinal nodes.  "Shotty "bilateral axillary nodes Resp: Lungs clear bilaterally Cardio: Regular rate and rhythm GI: No hepatosplenomegaly, no mass, nontender, healed midline incision Vascular: No leg edema  Skin: Multiple cutaneous cysts over the trunk and buttocks  Portacath/PICC-without erythema  Lab Results:  Lab Results  Component Value Date   WBC 9.7 04/02/2022   HGB 9.0 (L) 04/02/2022   HCT 30.3 (L) 04/02/2022   MCV 93.2 04/02/2022   PLT 354 04/02/2022   NEUTROABS 3.7 02/20/2022    CMP  Lab Results  Component Value Date   NA 140 11/27/2022   K 3.9 11/27/2022   CL 105 11/27/2022   CO2 27 11/27/2022   GLUCOSE 138 (H) 11/27/2022   BUN 14 11/27/2022   CREATININE 0.89 11/27/2022   CALCIUM 9.3 11/27/2022   PROT 6.8 04/02/2022   ALBUMIN 2.2 (L) 04/02/2022   AST 42 (H) 04/02/2022   ALT 63 (H) 04/02/2022   ALKPHOS 285 (H) 04/02/2022   BILITOT 0.5 04/02/2022   GFRNONAA >60 11/27/2022    Lab Results  Component Value Date   CEA1 0.8 11/19/2020   CEA <1.00 11/27/2022    Lab Results  Component Value Date   INR 1.2 11/15/2020   LABPROT 14.5 11/15/2020    Imaging:  CT CHEST ABDOMEN PELVIS W CONTRAST  Result Date: 11/28/2022 CLINICAL DATA:  History of metastatic colon cancer, prior partial colectomy with ileostomy and subsequent reversal as well as prior  partial hepatectomy, assess treatment response. * Tracking Code: BO * EXAM: CT CHEST, ABDOMEN, AND PELVIS WITH CONTRAST TECHNIQUE: Multidetector CT imaging of the chest, abdomen and pelvis was performed following the standard protocol during bolus administration of intravenous contrast. RADIATION DOSE REDUCTION: This exam was performed according to the departmental dose-optimization program which includes automated exposure control, adjustment of the mA and/or kV according to patient size and/or use of iterative reconstruction technique. CONTRAST:  142m OMNIPAQUE IOHEXOL 300 MG/ML  SOLN COMPARISON:  Multiple priors including CT March 27, 2022 March 19, 2022 and MRI March 31, 2022 FINDINGS: CT CHEST FINDINGS Cardiovascular: Right chest Port-A-Cath with tip near the superior cavoatrial junction. Normal caliber abdominal aorta. No central pulmonary embolus on this nondedicated study. Coronary artery calcifications. No significant pericardial effusion/thickening Mediastinum/Nodes: No supraclavicular adenopathy. No suspicious thyroid nodule. No pathologically enlarged mediastinal, hilar or axillary lymph nodes. Symmetric esophageal wall thickening. Lungs/Pleura: Tiny 4 mm subpleural pulmonary nodule in the superior segment of the right lower lobe on image 65/4 is stable dating back to CT November 15, 2020. Subpleural 3 mm left lower lobe pulmonary nodule on image 92/4 is unchanged dating back to November 15, 2020. No new suspicious pulmonary nodules or masses. Biapical pleuroparenchymal scarring. Paraseptal and centrilobular emphysema. Hypoventilatory change in the dependent lungs. Mild diffuse esophageal wall thickening. Musculoskeletal: No aggressive lytic or blastic lesion of bone.  Mild superior endplate deformity at T8 is unchanged dating back to CT November 15, 2020. Healed remote left posterior eleventh rib fracture. CT ABDOMEN PELVIS FINDINGS Hepatobiliary: Surgical changes of prior right hepatic lobe wedge resection  without evidence of local recurrence. 7 mm hypodensity along the inferior margin of the right lobe of the liver on image 75/2 is stable from prior and compatible with a cyst on MRI October 31, 2021. Additional 22 mm cyst in the anterior left lobe of the liver on image 66/2. Subtle hypodensity in the hepatic dome on image 46/20 1 compatible with a cyst on MRI October 31, 2021. No new suspicious hepatic lesions. Similar caudate lobe hypertrophy. Gallbladder surgically absent.  No biliary ductal dilation. Pancreas: No pancreatic ductal dilation or evidence of acute inflammation. Spleen: No splenomegaly. Adrenals/Urinary Tract: Bilateral adrenal glands appear normal. No hydronephrosis. Kidneys demonstrate symmetric enhancement and excretion of contrast material. Urinary bladder is unremarkable for degree of distension. Stomach/Bowel: Radiopaque enteric contrast material traverses distal loops of small bowel. Stomach is distended with ingested material and gas without focal wall thickening. No pathologic dilation of large or small bowel. Surgical changes of prior right hemicolectomy with right anterior abdominal wall colostomy and subsequent takedown with entero colonic anastomosis in the right upper quadrant. There is no suspicious enhancing soft tissue nodularity along the anastomotic suture line. Moderate volume of formed stool in the colon. Vascular/Lymphatic: Aortic atherosclerosis. Smooth IVC contours. The portal, splenic and superior mesenteric veins are patent. No pathologically enlarged abdominal or pelvic lymph nodes. Reproductive: Heterogeneous enhancement of the prostate gland. Other: Postoperative changes in the abdominal wall, similar prior. Probable sebaceous cyst in the right posterior gluteal subcutaneous tissue on image 103/2 Musculoskeletal: No aggressive lytic or blastic lesion of bone. Multilevel degenerative change of the spine. Degenerative change of the bilateral hips and SI joints with partial  bony ankylosis of the SI joints IMPRESSION: 1. Surgical changes of prior right hemicolectomy with with enterocolonic anastomosis in the right upper quadrant. No evidence of local recurrence. 2. Prior right hepatic wedge resection without evidence of local recurrence. 3. No convincing evidence of active metastatic disease within the chest, abdomen, or pelvis. 4. Stable tiny bilateral pulmonary nodules dating back to November 15, 2020. Continued attention on follow-up imaging suggested. 5. Moderate volume of formed stool in the colon. Correlate for constipation. 6. Heterogeneous enhancement of the prostate gland, nonspecific consider correlation with serum PSA. 7. Mild diffuse esophageal wall thickening, correlate for symptoms of esophagitis. 8. Aortic Atherosclerosis (ICD10-I70.0) and Emphysema (ICD10-J43.9). Electronically Signed   By: Dahlia Bailiff M.D.   On: 11/28/2022 08:23    Medications: I have reviewed the patient's current medications.   Assessment/Plan: Stage IV adenocarcinoma of the colon -11/15/2020 CT abdomen/pelvis with contrast-long segment masslike mural thickening of the cecum, posterior perforation at the site of the cecal wall thickening with multilocular right retroperitoneal abscess, indeterminate hypodensity in the inferior right lobe of the liver. -11/19/2020 CEA 0.8 -11/20/2020 MRI of the abdomen and pelvis with and without contrast-3.5 x 2.5 cm heterogeneous enhancing lesion in the inferior peripheral right liver with 2 smaller lesions with similar features. -11/20/2020 ultrasound-guided liver biopsy consistent with adenocarcinoma.  Foundation 1-MSS, tumor mutation burden 8,KRAS wt -11/25/2020 CT abdomen/pelvis with contrast-interval placement of a right lower quadrant percutaneous pigtail drainage catheter with near resolution of the retroperitoneal fluid collection, redemonstrated large fungating mass at the cecal base measuring 11.9 x 9.1 x 8.4 cm with enlarged lymph nodes in the right  lower quadrant mesocolon. -  11/28/2020 exploratory laparotomy, right colectomy, ileostomy creation, cystoscopy with stent placement             -Surgical pathology showed adenocarcinoma, moderately differentiated, 12 cm, 0/14+ lymph nodes, lymphovascular space invasion present, radial margin involved by invasive adenocarcinoma, appendix with intraluminal             adenocarcinoma, pT4, PN 0 -12/09/2020 CT abdomen/pelvis with contrast-interval right colectomy and right lower quadrant ileostomy, stable right retroperitoneal soft tissue density along the psoas and iliacus muscles, mild progression of liver metastases since prior study. -Cycle 1 FOLFOX 12/24/2020 -Cycle 2 FOLFOX 01/06/2021 -Cycle 3 FOLFOX 01/20/2021 -Cycle 4 FOLFOX 02/03/2021 -Cycle 5 FOLFOX 02/17/2021 -Cycle 6 FOLFOX 03/03/2021 -Restaging CTs 03/05/2021- substantial interval decrease in size of predominantly hypodense lesions throughout the liver.  Significant interval resolution of previously seen extensive, heterogeneous soft tissue in the right retroperitoneal soft tissues and paracolic gutter overlying the psoas and iliacus muscles. -Cycle 7 FOLFOX 03/19/2021 -Cycle 8 FOLFOX 04/03/2021, oxaliplatin dose reduced secondary to neutropenia and thrombocytopenia -Cycle 9 FOLFOX 04/15/2021 -Cycle 10 FOLFOX 04/29/2021 -Cycle 11 FOLFOX 05/13/2021, oxaliplatin held secondary to neuropathy -Cycle 12 FOLFOX 05/27/2021, oxaliplatin held secondary to neuropathy -CT abdomen/pelvis 06/09/2021-decrease size of remaining dominant liver lesion, other liver lesions are calcified and punctate, subtle density in the posterior right bladder, unchanged sclerotic right iliac lesion -Cycle 13 FOLFOX 06/10/2021, oxaliplatin held secondary to neuropathy -Maintenance Xeloda 06/25/2021 -MRI liver 07/03/2021-no significant change in subtle irregular nonenhancing remnant liver lesions of the inferior right lobe of the liver measuring 1.2 x 0.6 cm and adjacent to the gallbladder  fossa measuring no greater than 0.5 cm. -Maintenance Xeloda continued -Tumor conference 08/06/2021-rescan at a 93-monthinterval -Xeloda decreased to 1000 mg a.m., 500 mg p.m. 08/05/2021 (due to hand-foot syndrome) -MRI liver 10/31/2021-treated liver lesion within the inferior right hepatic lobe measuring 0.9 cm.  Small treated lesion within the posterior medial right hepatic lobe measuring 5 mm, unchanged. -Maintenance Xeloda continued -Xeloda discontinued 02/20/2022 -03/13/2022 hepatic wedge resection x2 and ileostomy reversal (nodule left liver bile duct hamartoma; nodule left liver #2 bile duct hamartoma; segment 5 liver mass benign fibrotic and calcified nodule, background liver parenchyma with features of nodular regenerative hyperplasia, no evidence of malignancy; segment 4 liver mass small incidental bile duct hamartoma, background liver parenchyma with features of nodular regenerative hyperplasia, no evidence of malignancy) -CTs 11/27/2022-no evidence of recurrent disease, stable tiny bilateral lung nodules, heterogenous enhancement of the prostate gland   2.  Sepsis secondary to perforated colon 3.  Retroperitoneal abscess 4.  Protein calorie malnutrition 5.  Thrombocytosis 6.  Iron deficiency anemia 7.  COVID-19 infection, 11/15/2020 8.  Tobacco abuse 9.  Family history of colon cancer 10.  Port-A-Cath placement, Dr. 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  Admission 09/22/2021 with dehydration and probable COPD flare 13.  Enterococcal bacteremia 03/27/2022, completing outpatient Augmentin, followed by ID      Disposition: Mr. HNovakovicappears stable.  The restaging CT showed no evidence of recurrent colon cancer.  The CEA is normal.  He would like to have the Port-A-Cath removed.  Will make referral to Dr. AZenia Resides  He will return for an office and lab visit in 4 months.  We will check a PSA when he returns in  4 months.  GBetsy Coder MD  11/30/2022  12:31 PM

## 2022-12-11 DIAGNOSIS — Z419 Encounter for procedure for purposes other than remedying health state, unspecified: Secondary | ICD-10-CM | POA: Diagnosis not present

## 2022-12-24 ENCOUNTER — Encounter: Payer: Self-pay | Admitting: *Deleted

## 2023-01-11 DIAGNOSIS — Z419 Encounter for procedure for purposes other than remedying health state, unspecified: Secondary | ICD-10-CM | POA: Diagnosis not present

## 2023-02-10 DIAGNOSIS — Z419 Encounter for procedure for purposes other than remedying health state, unspecified: Secondary | ICD-10-CM | POA: Diagnosis not present

## 2023-03-13 DIAGNOSIS — Z419 Encounter for procedure for purposes other than remedying health state, unspecified: Secondary | ICD-10-CM | POA: Diagnosis not present

## 2023-03-30 ENCOUNTER — Encounter: Payer: Self-pay | Admitting: Nurse Practitioner

## 2023-03-30 ENCOUNTER — Inpatient Hospital Stay: Payer: Medicaid Other | Attending: Oncology

## 2023-03-30 ENCOUNTER — Inpatient Hospital Stay (HOSPITAL_BASED_OUTPATIENT_CLINIC_OR_DEPARTMENT_OTHER): Payer: Medicaid Other | Admitting: Nurse Practitioner

## 2023-03-30 ENCOUNTER — Inpatient Hospital Stay: Payer: Medicaid Other

## 2023-03-30 VITALS — BP 140/100 | HR 111 | Temp 98.1°F | Resp 18 | Ht 74.0 in | Wt 168.9 lb

## 2023-03-30 DIAGNOSIS — Z8616 Personal history of COVID-19: Secondary | ICD-10-CM | POA: Diagnosis not present

## 2023-03-30 DIAGNOSIS — D75839 Thrombocytosis, unspecified: Secondary | ICD-10-CM | POA: Insufficient documentation

## 2023-03-30 DIAGNOSIS — K6819 Other retroperitoneal abscess: Secondary | ICD-10-CM | POA: Diagnosis not present

## 2023-03-30 DIAGNOSIS — K631 Perforation of intestine (nontraumatic): Secondary | ICD-10-CM | POA: Insufficient documentation

## 2023-03-30 DIAGNOSIS — Z9049 Acquired absence of other specified parts of digestive tract: Secondary | ICD-10-CM | POA: Diagnosis not present

## 2023-03-30 DIAGNOSIS — D509 Iron deficiency anemia, unspecified: Secondary | ICD-10-CM | POA: Diagnosis not present

## 2023-03-30 DIAGNOSIS — E46 Unspecified protein-calorie malnutrition: Secondary | ICD-10-CM | POA: Insufficient documentation

## 2023-03-30 DIAGNOSIS — Z800 Family history of malignant neoplasm of digestive organs: Secondary | ICD-10-CM | POA: Diagnosis not present

## 2023-03-30 DIAGNOSIS — C189 Malignant neoplasm of colon, unspecified: Secondary | ICD-10-CM

## 2023-03-30 DIAGNOSIS — A419 Sepsis, unspecified organism: Secondary | ICD-10-CM | POA: Insufficient documentation

## 2023-03-30 DIAGNOSIS — C787 Secondary malignant neoplasm of liver and intrahepatic bile duct: Secondary | ICD-10-CM

## 2023-03-30 DIAGNOSIS — C184 Malignant neoplasm of transverse colon: Secondary | ICD-10-CM | POA: Diagnosis not present

## 2023-03-30 DIAGNOSIS — Z95828 Presence of other vascular implants and grafts: Secondary | ICD-10-CM

## 2023-03-30 LAB — CMP (CANCER CENTER ONLY)
ALT: 17 U/L (ref 0–44)
AST: 15 U/L (ref 15–41)
Albumin: 4.7 g/dL (ref 3.5–5.0)
Alkaline Phosphatase: 106 U/L (ref 38–126)
Anion gap: 11 (ref 5–15)
BUN: 12 mg/dL (ref 6–20)
CO2: 24 mmol/L (ref 22–32)
Calcium: 9.8 mg/dL (ref 8.9–10.3)
Chloride: 102 mmol/L (ref 98–111)
Creatinine: 1 mg/dL (ref 0.61–1.24)
GFR, Estimated: >60 mL/min (ref >60)
Glucose, Bld: 121 mg/dL — ABNORMAL HIGH (ref 70–99)
Potassium: 4 mmol/L (ref 3.5–5.1)
Sodium: 137 mmol/L (ref 135–145)
Total Bilirubin: 0.8 mg/dL (ref 0.3–1.2)
Total Protein: 8 g/dL (ref 6.5–8.1)

## 2023-03-30 LAB — CEA (ACCESS): CEA (CHCC): <1 ng/mL (ref 0.00–5.00)

## 2023-03-30 MED ORDER — HEPARIN SOD (PORK) LOCK FLUSH 100 UNIT/ML IV SOLN
500.0000 [IU] | Freq: Once | INTRAVENOUS | Status: AC
  Administered 2023-03-30: 500 [IU] via INTRAVENOUS

## 2023-03-30 MED ORDER — SODIUM CHLORIDE 0.9% FLUSH
10.0000 mL | INTRAVENOUS | Status: DC | PRN
  Administered 2023-03-30: 10 mL via INTRAVENOUS

## 2023-03-30 NOTE — Progress Notes (Signed)
[facility] OFFICE PROGRESS NOTE   Diagnosis: Colon cancer  INTERVAL HISTORY:   Eric [name] returns as scheduled.  Overall he feels well.  He has a good appetite.  No nausea or vomiting.  Bowels moving regularly.  No diarrhea or bleeding.  Only complaint is abdominal pain when he climbs a ladder.  He attributes this to a "hernia".  He continues to vape.  Objective:  Vital signs in last 24 hours:  Blood pressure (!) 140/100, pulse (!) 111, temperature 98.1 F (36.7 C), temperature source Oral, resp. rate 18, height 6\' 2"  (1.88 m), weight 168 lb 14.4 oz (76.6 kg), SpO2 99 %.    Lymphatics: No palpable cervical, supraclavicular, axillary or inguinal lymph nodes. Resp: Lungs clear bilaterally. Cardio: Regular rate and rhythm. GI: Abdomen soft and nontender.  No hepatosplenomegaly.  No mass. Vascular: No leg edema. Port-A-Cath without erythema.  Lab Results:  Lab Results  Component Value Date   WBC 9.7 04/02/2022   HGB 9.0 (L) 04/02/2022   HCT 30.3 (L) 04/02/2022   MCV 93.2 04/02/2022   PLT 354 04/02/2022   NEUTROABS 3.7 02/20/2022    Imaging:  No results found.  Medications: I have reviewed the patient's current medications.  Assessment/Plan: Stage IV adenocarcinoma of the colon -11/15/2020 CT abdomen/pelvis with contrast-long segment masslike mural thickening of the cecum, posterior perforation at the site of the cecal wall thickening with multilocular right retroperitoneal abscess, indeterminate hypodensity in the inferior right lobe of the liver. -11/19/2020 CEA 0.8 -11/20/2020 MRI of the abdomen and pelvis with and without contrast-3.5 x 2.5 cm heterogeneous enhancing lesion in the inferior peripheral right liver with 2 smaller lesions with similar features. -11/20/2020 ultrasound-guided liver biopsy consistent with adenocarcinoma.  Foundation 1-MSS, tumor mutation burden 8,KRAS wt -11/25/2020 CT abdomen/pelvis with contrast-interval placement of a right lower  quadrant percutaneous pigtail drainage catheter with near resolution of the retroperitoneal fluid collection, redemonstrated large fungating mass at the cecal base measuring 11.9 x 9.1 x 8.4 cm with enlarged lymph nodes in the right lower quadrant mesocolon. -11/28/2020 exploratory laparotomy, right colectomy, ileostomy creation, cystoscopy with stent placement             -Surgical pathology showed adenocarcinoma, moderately differentiated, 12 cm, 0/14+ lymph nodes, lymphovascular space invasion present, radial margin involved by invasive adenocarcinoma, appendix with intraluminal             adenocarcinoma, pT4, PN 0 -12/09/2020 CT abdomen/pelvis with contrast-interval right colectomy and right lower quadrant ileostomy, stable right retroperitoneal soft tissue density along the psoas and iliacus muscles, mild progression of liver metastases since prior study. -Cycle 1 FOLFOX 12/24/2020 -Cycle 2 FOLFOX 01/06/2021 -Cycle 3 FOLFOX 01/20/2021 -Cycle 4 FOLFOX 02/03/2021 -Cycle 5 FOLFOX 02/17/2021 -Cycle 6 FOLFOX 03/03/2021 -Restaging CTs 03/05/2021- substantial interval decrease in size of predominantly hypodense lesions throughout the liver.  Significant interval resolution of previously seen extensive, heterogeneous soft tissue in the right retroperitoneal soft tissues and paracolic gutter overlying the psoas and iliacus muscles. -Cycle 7 FOLFOX 03/19/2021 -Cycle 8 FOLFOX 04/03/2021, oxaliplatin dose reduced secondary to neutropenia and thrombocytopenia -Cycle 9 FOLFOX 04/15/2021 -Cycle 10 FOLFOX 04/29/2021 -Cycle 11 FOLFOX 05/13/2021, oxaliplatin held secondary to neuropathy -Cycle 12 FOLFOX 05/27/2021, oxaliplatin held secondary to neuropathy -CT abdomen/pelvis 06/09/2021-decrease size of remaining dominant liver lesion, other liver lesions are calcified and punctate, subtle density in the posterior right bladder, unchanged sclerotic right iliac lesion -Cycle 13 FOLFOX 06/10/2021, oxaliplatin held secondary to  neuropathy -Maintenance Xeloda 06/25/2021 -MRI liver 07/03/2021-no  significant change in subtle irregular nonenhancing remnant liver lesions of the inferior right lobe of the liver measuring 1.2 x 0.6 cm and adjacent to the gallbladder fossa measuring no greater than 0.5 cm. -Maintenance Xeloda continued -Tumor conference 08/06/2021-rescan at a 98-month interval -Xeloda decreased to 1000 mg a.m., 500 mg p.m. 08/05/2021 (due to hand-foot syndrome) -MRI liver 10/31/2021-treated liver lesion within the inferior right hepatic lobe measuring 0.9 cm.  Small treated lesion within the posterior medial right hepatic lobe measuring 5 mm, unchanged. -Maintenance Xeloda continued -Xeloda discontinued 02/20/2022 -03/13/2022 hepatic wedge resection x2 and ileostomy reversal (nodule left liver bile duct hamartoma; nodule left liver #2 bile duct hamartoma; segment 5 liver mass benign fibrotic and calcified nodule, background liver parenchyma with features of nodular regenerative hyperplasia, no evidence of malignancy; segment 4 liver mass small incidental bile duct hamartoma, background liver parenchyma with features of nodular regenerative hyperplasia, no evidence of malignancy) -CTs 11/27/2022-no evidence of recurrent disease, stable tiny bilateral lung nodules, heterogenous enhancement of the prostate gland   2.  Sepsis secondary to perforated colon 3.  Retroperitoneal abscess 4.  Protein calorie malnutrition 5.  Thrombocytosis 6.  Iron deficiency anemia 7.  COVID-19 infection, 11/15/2020 8.  Tobacco abuse 9.  Family history of colon cancer 10.  Port-A-Cath placement, Dr. 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  Admission 09/22/2021 with dehydration and probable COPD flare 13.  Enterococcal bacteremia 03/27/2022, completing outpatient Augmentin, followed by ID    Disposition: Eric [name] appears stable.  There is no clinical evidence  of disease progression.  We will follow-up on the CEA from today.  Plan for restaging CTs in approximately 2 months.  He would like to have the Port-A-Cath removed.  We will contact Dr. Eliot Ford office.  Blood pressure is elevated in the office today.  He reports vaping before the appointment, also had an energy drink, [employer], coffee earlier today.  He will try to discontinue vaping and decrease caffeine intake.  He has a blood pressure cuff at home and will obtain a repeat reading later today.  He understands to follow-up with his PCP with persistent elevated readings.  He will return for follow-up in 4 months.    Lonna Cobb ANP/GNP-BC   03/30/2023  2:31 PM

## 2023-03-30 NOTE — Patient Instructions (Signed)

## 2023-03-31 ENCOUNTER — Telehealth: Payer: Self-pay

## 2023-03-31 ENCOUNTER — Ambulatory Visit: Payer: Medicaid Other | Admitting: Nurse Practitioner

## 2023-03-31 ENCOUNTER — Other Ambulatory Visit: Payer: Medicaid Other

## 2023-03-31 NOTE — Telephone Encounter (Signed)
I reached out to Mrs. Harriett Sine, the patient's sister, to notify her that the patient has an appointment scheduled on June 21st at 10:30am at Dr. Eliot Ford office. Mrs. Harriett Sine acknowledged this information verbally and did not express any additional questions or concerns.

## 2023-04-01 LAB — PROSTATE-SPECIFIC AG, SERUM (LABCORP): Prostate Specific Ag, Serum: 1.2 ng/mL (ref 0.0–4.0)

## 2023-04-02 ENCOUNTER — Ambulatory Visit: Payer: Self-pay | Admitting: Surgery

## 2023-04-02 DIAGNOSIS — Z95828 Presence of other vascular implants and grafts: Secondary | ICD-10-CM | POA: Diagnosis not present

## 2023-04-02 DIAGNOSIS — K432 Incisional hernia without obstruction or gangrene: Secondary | ICD-10-CM | POA: Diagnosis not present

## 2023-04-02 NOTE — H&P (Signed)
History of Present Illness: Eric [name] is a 52 y.o. male who is seen today for follow up to discuss port removal. Briefly, he presented with a perforated stage IV right colon cancer with liver mets in February 2022 and underwent an open right colectomy at that time. He completed 13 cycles FOLFOX in August 2022, with subsequent radiographic resolution of some of the liver lesions. He then started treatment with maintenance Xeloda. He ultimately underwent open hepatic metastasectomy x2 as well as ileostomy reversal on 03/13/22. Final path showed that the resected liver lesions were benign, likely representing previous tumor with complete pathologic response. Postoperatively his wound dehisced and he underwent placement of a Vicryl mesh bridge on 6/12.   He last saw me in August 2023 with plans to proceed with port removal, however he did not schedule due to insurance issues and was subsequently lost to follow up. He has continued to follow up with Dr. Truett Perna, and his most recent CEA on 6/18 was <1.00. His last staging scans on 2/16 showed postoperative changes and several small liver cysts, but no evidence of recurrent disease. He would like to have port removed. He is overall doing well, but does have pain with activity at his incisional hernia. He would like to try an abdominal binder, which has helped in the past. It has been over a year since he smoked any cigarettes but he is still vaping sometimes.         Review of Systems: A complete review of systems was obtained from the patient.  I have reviewed this information and discussed as appropriate with the patient.  See HPI as well for other ROS.       Medical History: Past Medical History Past Medical History: Diagnosis Date  COPD (chronic obstructive pulmonary disease) (CMS/HHS-HCC)    History of cancer        Problem List Patient Active Problem List Diagnosis  Colon cancer metastasized to liver (CMS/HHS-HCC)      Past Surgical  History Past Surgical History: Procedure Laterality Date  Right hemicolectomy          Allergies No Known Allergies    Medications Ordered Prior to Encounter Current Outpatient Medications on File Prior to Visit Medication Sig Dispense Refill  albuterol 90 mcg/actuation inhaler Inhale into the lungs      calcium polycarbophiL (FIBERCON) 625 mg tablet Take by mouth      diazePAM (VALIUM) 5 MG tablet Take 1/2 tablet by mouth 30 - 60 minutes  prior to MRI --bring other 1/2 tablet to appointment.      diphenoxylate-atropine (LOMOTIL) 2.5-0.025 mg tablet Take by mouth      fluticasone furoate-vilanteroL (BREO ELLIPTA) 100-25 mcg/dose DsDv inhaler Inhale 1 inhalation into the lungs once daily      gabapentin (NEURONTIN) 300 MG capsule Take 1 capsule by mouth 3 (three) times daily      ondansetron (ZOFRAN) 8 MG tablet Take by mouth      polyethylene glycol (MIRALAX) packet Take 1 packet (17 g total) by mouth once daily Mix in 4-8ounces of fluid prior to taking. 30 packet 2  prochlorperazine (COMPAZINE) 10 MG tablet Take 10 mg by mouth every 6 (six) hours as needed        No current facility-administered medications on file prior to visit.      Family History Family History Problem Relation Age of Onset  Stroke Mother    High blood pressure (Hypertension) Mother    Hyperlipidemia (Elevated cholesterol) Mother  Coronary Artery Disease (Blocked arteries around heart) Father    High blood pressure (Hypertension) Sister    Hyperlipidemia (Elevated cholesterol) Sister    Diabetes Sister        Tobacco Use History Social History    Tobacco Use Smoking Status Every Day  Types: Cigarettes Smokeless Tobacco Current      Social History Social History    Socioeconomic History  Marital status: Single Tobacco Use  Smoking status: Every Day     Types: Cigarettes  Smokeless tobacco: Current Substance and Sexual Activity  Alcohol use: Never  Drug use: Yes      Types: Marijuana    Social Determinants of Health    Financial Resource Strain: Medium Risk (12/19/2020)   Received from [facility], [place]   Overall Financial Resource Strain (CARDIA)    Difficulty of Paying Living Expenses: Somewhat hard Food Insecurity: No Food Insecurity (12/19/2020)   Received from [facility], [place]   Hunger Vital Sign    Worried About Running Out of Food in the Last Year: Never true    Ran Out of Food in the Last Year: Never true Transportation Needs: No Transportation Needs (12/19/2020)   Received from [facility], [place]   PRAPARE - Transportation    Lack of Transportation (Medical): No    Lack of Transportation (Non-Medical): No Physical Activity: Inactive (12/09/2021)   Received from [facility], [place], [place]   Exercise Vital Sign    Days of Exercise per Week: 0 days    Minutes of Exercise per Session: 0 min Stress: No Stress Concern Present (12/09/2021)   Received from [facility], [facility]   [facility] - Occupational Stress Questionnaire    Feeling of Stress : Only a little Social Connections: Moderately Isolated (12/09/2021)   Received from [facility], [place]   Social Connection and Isolation Panel [NHANES]    Frequency of Communication with Friends and Family: More than three times a week    Frequency of Social Gatherings with Friends and Family: Three times a week    Attends Religious Services: Never    Active Member of Clubs or Organizations: Yes    Attends Banker Meetings: Never    Marital Status: Never married      Objective:     Vitals:   04/02/23 1054 BP: (!) 132/90 Pulse: 94 Temp: 37.2 C (99 F) SpO2: 97% Weight: 75.8 kg (167 lb) Height: 190.5 cm (6\' 3" )   Body mass index is 20.87 kg/m.   Physical Exam Vitals reviewed.  Constitutional:      General: He is not in acute distress.    Appearance: Normal appearance.  HENT:     Head: Normocephalic and  atraumatic.  Eyes:     General: No scleral icterus.    Conjunctiva/sclera: Conjunctivae normal.  Pulmonary:     Effort: Pulmonary effort is normal. No respiratory distress.  Chest:     Comments: Port in place R upper chest wall. Abdominal:     General: There is no distension.     Palpations: Abdomen is soft.     Tenderness: There is no abdominal tenderness.     Comments: Well-healed midline scar with hernia, soft and nontender.  Musculoskeletal:        General: Normal range of motion.  Skin:    General: Skin is warm and dry.  Neurological:     General: No focal deficit present.     Mental Status: He is  alert and oriented to person, place, and time.            Labs, Imaging and Diagnostic Testing: CT chest/abd/pelvis 11/27/22: IMPRESSION: 1. Surgical changes of prior right hemicolectomy with with enterocolonic anastomosis in the right upper quadrant. No evidence of local recurrence. 2. Prior right hepatic wedge resection without evidence of local recurrence. 3. No convincing evidence of active metastatic disease within the chest, abdomen, or pelvis. 4. Stable tiny bilateral pulmonary nodules dating back to November 15, 2020. Continued attention on follow-up imaging suggested. 5. Moderate volume of formed stool in the colon. Correlate for constipation. 6. Heterogeneous enhancement of the prostate gland, nonspecific consider correlation with serum PSA. 7. Mild diffuse esophageal wall thickening, correlate for symptoms of esophagitis. 8. Aortic Atherosclerosis (ICD10-I70.0) and Emphysema (ICD10-J43.9).       Assessment and Plan:   Assessment Diagnoses and all orders for this visit:   Port-A-Cath in place   Ventral incisional hernia -     Abdominal [place]     This is a 52 yo male with a history of stage IV colon cancer with synchronous oligometastatic disease to the liver, now one year s/p hepatic metastasectomy, with no evidence of recurrent disease. He would like  to have his port removed. I reviewed the details of this procedure, which can be done under MAC with local anesthetic. He will be contacted to schedule an elective surgery date.    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 I recommended that he completely stop using any nicotine products prior to repair to minimize the risk of complications. He was given a script for an abdominal binder today.   Following port removal I will see him back in 6 months for ongoing follow up and surveillance.   Sophronia Simas, MD [facility] General, Hepatobiliary and Pancreatic Surgery 04/02/23 12:30 PM

## 2023-04-08 ENCOUNTER — Other Ambulatory Visit: Payer: Self-pay

## 2023-04-08 ENCOUNTER — Encounter (HOSPITAL_COMMUNITY): Payer: Self-pay | Admitting: Surgery

## 2023-04-08 NOTE — Progress Notes (Signed)
Pt denies cardiac history, HTN or diabetes. Pt has been treated for colon cancer with liver metastasis.   Shower instructions given to pt.

## 2023-04-11 ENCOUNTER — Encounter (HOSPITAL_COMMUNITY): Payer: Self-pay | Admitting: Anesthesiology

## 2023-04-12 ENCOUNTER — Encounter (HOSPITAL_COMMUNITY)
Admission: RE | Disposition: A | Discharge status: Home / Self Care | Payer: Self-pay | Source: Home / Self Care | Attending: Surgery

## 2023-04-12 ENCOUNTER — Other Ambulatory Visit: Payer: Self-pay

## 2023-04-12 ENCOUNTER — Encounter (HOSPITAL_COMMUNITY): Payer: Self-pay | Admitting: Surgery

## 2023-04-12 ENCOUNTER — Ambulatory Visit (HOSPITAL_COMMUNITY)
Admission: RE | Admit: 2023-04-12 | Discharge: 2023-04-12 | Disposition: A | Discharge status: Home / Self Care | Payer: Medicare Other | Attending: Surgery | Admitting: Surgery

## 2023-04-12 DIAGNOSIS — Z5309 Procedure and treatment not carried out because of other contraindication: Secondary | ICD-10-CM | POA: Insufficient documentation

## 2023-04-12 DIAGNOSIS — Z452 Encounter for adjustment and management of vascular access device: Secondary | ICD-10-CM | POA: Diagnosis not present

## 2023-04-12 DIAGNOSIS — Z419 Encounter for procedure for purposes other than remedying health state, unspecified: Secondary | ICD-10-CM | POA: Diagnosis not present

## 2023-04-12 HISTORY — DX: Anemia, unspecified: D64.9

## 2023-04-12 HISTORY — DX: Unspecified asthma, uncomplicated: J45.909

## 2023-04-12 HISTORY — DX: Cardiac murmur, unspecified: R01.1

## 2023-04-12 HISTORY — DX: Gastro-esophageal reflux disease without esophagitis: K21.9

## 2023-04-12 LAB — CBC
HCT: 39.1 % (ref 39.0–52.0)
Hemoglobin: 13.1 g/dL (ref 13.0–17.0)
MCH: 28.4 pg (ref 26.0–34.0)
MCHC: 33.5 g/dL (ref 30.0–36.0)
MCV: 84.6 fL (ref 80.0–100.0)
Platelets: 267 10*3/uL (ref 150–400)
RBC: 4.62 MIL/uL (ref 4.22–5.81)
RDW: 13.5 % (ref 11.5–15.5)
WBC: 8.4 10*3/uL (ref 4.0–10.5)
nRBC: 0 % (ref 0.0–0.2)

## 2023-04-12 SURGERY — REMOVAL PORT-A-CATH
Anesthesia: Monitor Anesthesia Care

## 2023-04-12 MED ORDER — CEFAZOLIN SODIUM-DEXTROSE 2-4 GM/100ML-% IV SOLN
INTRAVENOUS | Status: AC
  Filled 2023-04-12: qty 100

## 2023-04-12 MED ORDER — ACETAMINOPHEN 500 MG PO TABS
1000.0000 mg | ORAL_TABLET | ORAL | Status: DC
  Filled 2023-04-12: qty 2

## 2023-04-12 MED ORDER — LACTATED RINGERS IV SOLN: INTRAVENOUS | Status: DC

## 2023-04-12 MED ORDER — CHLORHEXIDINE GLUCONATE 0.12 % MT SOLN
15.0000 mL | Freq: Once | OROMUCOSAL | Status: DC
  Filled 2023-04-12: qty 15

## 2023-04-12 MED ORDER — CELECOXIB 200 MG PO CAPS
200.0000 mg | ORAL_CAPSULE | ORAL | Status: DC
  Filled 2023-04-12: qty 1

## 2023-04-12 MED ORDER — ORAL CARE MOUTH RINSE: 15.0000 mL | Freq: Once | OROMUCOSAL | Status: DC

## 2023-04-12 NOTE — Anesthesia Preprocedure Evaluation (Deleted)
Anesthesia Evaluation  Patient identified by MRN, date of birth, ID band Patient awake    Reviewed: Allergy & Precautions, H&P , NPO status , Patient's Chart, lab work & pertinent test results  Airway Mallampati: I  TM Distance: >3 FB Neck ROM: Full    Dental no notable dental hx. (+) Edentulous Upper, Partial Lower, Dental Advisory Given   Pulmonary COPD,  COPD inhaler, Current Smoker and Patient abstained from smoking.   Pulmonary exam normal breath sounds clear to auscultation       Cardiovascular + DOE  negative cardio ROS  Rhythm:Regular Rate:Normal     Neuro/Psych negative neurological ROS  negative psych ROS   GI/Hepatic Neg liver ROS,GERD  Medicated,,  Endo/Other  negative endocrine ROS    Renal/GU negative Renal ROS  negative genitourinary   Musculoskeletal   Abdominal   Peds  Hematology  (+) Blood dyscrasia, anemia   Anesthesia Other Findings   Reproductive/Obstetrics negative OB ROS                             Anesthesia Physical Anesthesia Plan  ASA: 3  Anesthesia Plan: MAC   Post-op Pain Management: Minimal or no pain anticipated   Induction: Intravenous  PONV Risk Score and Plan: 2 and Ondansetron, Dexamethasone and Midazolam  Airway Management Planned: Natural Airway and Simple Face Mask  Additional Equipment: None  Intra-op Plan:   Post-operative Plan: Extubation in OR  Informed Consent: I have reviewed the patients History and Physical, chart, labs and discussed the procedure including the risks, benefits and alternatives for the proposed anesthesia with the patient or authorized representative who has indicated his/her understanding and acceptance.     Dental advisory given  Plan Discussed with: CRNA and Anesthesiologist  Anesthesia Plan Comments:         Anesthesia Quick Evaluation

## 2023-04-12 NOTE — Progress Notes (Signed)
Patient arrived to pre-op for procedure stating he last ate at 1030 this morning. Anesthesia made aware. Dr. Tacy Dura cancelled procedure. Patient instructed to call office to reschedule. OR and Dr. Freida Busman made aware. Patient ambulatory and discharged home with sister. IV removed, clean, dry, & intact.

## 2023-06-01 ENCOUNTER — Inpatient Hospital Stay: Payer: Medicaid Other | Attending: Oncology

## 2023-06-01 ENCOUNTER — Ambulatory Visit (HOSPITAL_BASED_OUTPATIENT_CLINIC_OR_DEPARTMENT_OTHER): Admission: RE | Admit: 2023-06-01 | Payer: Medicare Other | Source: Ambulatory Visit

## 2023-06-01 ENCOUNTER — Telehealth: Payer: Self-pay | Admitting: *Deleted

## 2023-06-01 ENCOUNTER — Inpatient Hospital Stay: Payer: Medicaid Other

## 2023-06-01 NOTE — Telephone Encounter (Signed)
Eric [name] missed his port flush/lab and CT scan today. LVM requesting return call to discuss the reschedule of this.

## 2023-06-03 NOTE — Telephone Encounter (Signed)
Spoke w/sister, Harriett Sine who reports she tried to pick him up on 8/20 to bring him here for appointments/scan and he said he hurt too back and did not want to get OOB. She has not talked to him since. She plans to go check on him today and will have him call to get things rescheduled.

## 2023-06-06 ENCOUNTER — Ambulatory Visit (HOSPITAL_BASED_OUTPATIENT_CLINIC_OR_DEPARTMENT_OTHER): Payer: Medicare Other

## 2023-06-15 ENCOUNTER — Inpatient Hospital Stay: Payer: Medicare Other

## 2023-06-15 ENCOUNTER — Ambulatory Visit (HOSPITAL_BASED_OUTPATIENT_CLINIC_OR_DEPARTMENT_OTHER)
Admission: RE | Admit: 2023-06-15 | Discharge: 2023-06-15 | Disposition: A | Discharge status: Home / Self Care | Payer: Medicare Other | Source: Ambulatory Visit | Attending: Nurse Practitioner | Admitting: Nurse Practitioner

## 2023-06-15 ENCOUNTER — Inpatient Hospital Stay: Payer: Medicare Other | Attending: Oncology

## 2023-06-15 VITALS — BP 148/111 | HR 95 | Temp 98.2°F | Resp 18

## 2023-06-15 DIAGNOSIS — C189 Malignant neoplasm of colon, unspecified: Secondary | ICD-10-CM | POA: Diagnosis present

## 2023-06-15 DIAGNOSIS — C787 Secondary malignant neoplasm of liver and intrahepatic bile duct: Secondary | ICD-10-CM | POA: Insufficient documentation

## 2023-06-15 DIAGNOSIS — C184 Malignant neoplasm of transverse colon: Secondary | ICD-10-CM | POA: Insufficient documentation

## 2023-06-15 DIAGNOSIS — Z95828 Presence of other vascular implants and grafts: Secondary | ICD-10-CM

## 2023-06-15 LAB — BASIC METABOLIC PANEL - CANCER CENTER ONLY
Anion gap: 8 (ref 5–15)
BUN: 20 mg/dL (ref 6–20)
CO2: 25 mmol/L (ref 22–32)
Calcium: 8.6 mg/dL — ABNORMAL LOW (ref 8.9–10.3)
Chloride: 103 mmol/L (ref 98–111)
Creatinine: 1.07 mg/dL (ref 0.61–1.24)
GFR, Estimated: >60 mL/min (ref >60)
Glucose, Bld: 130 mg/dL — ABNORMAL HIGH (ref 70–99)
Potassium: 4 mmol/L (ref 3.5–5.1)
Sodium: 136 mmol/L (ref 135–145)

## 2023-06-15 MED ORDER — HEPARIN SOD (PORK) LOCK FLUSH 100 UNIT/ML IV SOLN: 500.0000 [IU] | Freq: Once | INTRAVENOUS | Status: DC

## 2023-06-15 MED ORDER — SODIUM CHLORIDE 0.9% FLUSH: 10.0000 mL | Freq: Once | INTRAVENOUS | Status: DC

## 2023-06-15 MED ORDER — HEPARIN SOD (PORK) LOCK FLUSH 100 UNIT/ML IV SOLN
500.0000 [IU] | Freq: Once | INTRAVENOUS | Status: AC
  Administered 2023-06-15: 500 [IU] via INTRAVENOUS

## 2023-06-15 MED ORDER — IOHEXOL 350 MG/ML SOLN
100.0000 mL | Freq: Once | INTRAVENOUS | Status: AC | PRN
  Administered 2023-06-15: 75 mL via INTRAVENOUS

## 2023-06-21 ENCOUNTER — Telehealth: Payer: Self-pay

## 2023-06-21 NOTE — Telephone Encounter (Signed)
-----   Message from Lonna Cobb sent at 06/21/2023 10:58 AM EDT ----- Please let him know scan looks good, needs to f/u with primary MD to assess for CAD and check PSA; follow-up here in the next 1 to 2 months with Dr. Truett Perna.

## 2023-06-21 NOTE — Telephone Encounter (Signed)
Patient gave verbal understanding and had no further questions or concerns  

## 2023-07-30 ENCOUNTER — Ambulatory Visit: Payer: Medicaid Other | Admitting: Oncology

## 2023-07-30 ENCOUNTER — Other Ambulatory Visit: Payer: Medicaid Other

## 2023-08-26 ENCOUNTER — Other Ambulatory Visit (HOSPITAL_BASED_OUTPATIENT_CLINIC_OR_DEPARTMENT_OTHER): Payer: Self-pay

## 2023-08-26 ENCOUNTER — Inpatient Hospital Stay: Payer: Medicare Other | Attending: Oncology | Admitting: Oncology

## 2023-08-26 ENCOUNTER — Other Ambulatory Visit: Payer: Self-pay

## 2023-08-26 ENCOUNTER — Telehealth: Payer: Self-pay

## 2023-08-26 ENCOUNTER — Inpatient Hospital Stay: Payer: Medicare Other

## 2023-08-26 VITALS — BP 163/110 | HR 96 | Temp 98.1°F | Resp 18 | Ht 74.0 in | Wt 173.9 lb

## 2023-08-26 DIAGNOSIS — K6819 Other retroperitoneal abscess: Secondary | ICD-10-CM | POA: Diagnosis not present

## 2023-08-26 DIAGNOSIS — F4323 Adjustment disorder with mixed anxiety and depressed mood: Secondary | ICD-10-CM | POA: Diagnosis not present

## 2023-08-26 DIAGNOSIS — F17210 Nicotine dependence, cigarettes, uncomplicated: Secondary | ICD-10-CM | POA: Diagnosis not present

## 2023-08-26 DIAGNOSIS — Z85038 Personal history of other malignant neoplasm of large intestine: Secondary | ICD-10-CM | POA: Diagnosis present

## 2023-08-26 DIAGNOSIS — E46 Unspecified protein-calorie malnutrition: Secondary | ICD-10-CM | POA: Insufficient documentation

## 2023-08-26 DIAGNOSIS — Z800 Family history of malignant neoplasm of digestive organs: Secondary | ICD-10-CM | POA: Insufficient documentation

## 2023-08-26 DIAGNOSIS — D509 Iron deficiency anemia, unspecified: Secondary | ICD-10-CM | POA: Diagnosis not present

## 2023-08-26 DIAGNOSIS — Z8616 Personal history of COVID-19: Secondary | ICD-10-CM | POA: Diagnosis not present

## 2023-08-26 DIAGNOSIS — C787 Secondary malignant neoplasm of liver and intrahepatic bile duct: Secondary | ICD-10-CM

## 2023-08-26 DIAGNOSIS — D75839 Thrombocytosis, unspecified: Secondary | ICD-10-CM | POA: Insufficient documentation

## 2023-08-26 DIAGNOSIS — C189 Malignant neoplasm of colon, unspecified: Secondary | ICD-10-CM

## 2023-08-26 MED ORDER — FLULAVAL 0.5 ML IM SUSY
0.5000 mL | PREFILLED_SYRINGE | Freq: Once | INTRAMUSCULAR | 0 refills | Status: AC
  Filled 2023-08-26 (×3): qty 0.5, 1d supply, fill #0

## 2023-08-26 MED ORDER — COMIRNATY 30 MCG/0.3ML IM SUSY
0.3000 mL | PREFILLED_SYRINGE | Freq: Once | INTRAMUSCULAR | 0 refills | Status: AC
  Filled 2023-08-26 (×3): qty 0.3, 1d supply, fill #0

## 2023-08-26 NOTE — Progress Notes (Signed)
[facility] OFFICE PROGRESS NOTE   Diagnosis: Colon cancer  INTERVAL HISTORY:   [name] returns as scheduled.  He feels well.  Good appetite.  No difficulty with bowel function.  No bleeding.  He was scheduled to have the Port-A-Cath removed by Dr. Freida Busman, but he ate prior to the procedure and it was canceled.  Objective:  Vital signs in last 24 hours:  Blood pressure (!) 163/110, pulse 96, temperature 98.1 F (36.7 C), temperature source Temporal, resp. rate 18, height 6\' 2"  (1.88 m), weight 173 lb 14.4 oz (78.9 kg), SpO2 98%.    Lymphatics: No cervical, supraclavicular, axillary, or inguinal nodes Resp: Lungs clear bilaterally Cardio: Regular rate and rhythm GI: Fullness to the right side of the midline incision-hernia?  No hepatosplenomegaly Vascular: No leg edema   Portacath/PICC-without erythema  Lab Results:  Lab Results  Component Value Date   WBC 8.4 04/12/2023   HGB 13.1 04/12/2023   HCT 39.1 04/12/2023   MCV 84.6 04/12/2023   PLT 267 04/12/2023   NEUTROABS 3.7 02/20/2022    CMP  Lab Results  Component Value Date   NA 136 06/15/2023   K 4.0 06/15/2023   CL 103 06/15/2023   CO2 25 06/15/2023   GLUCOSE 130 (H) 06/15/2023   BUN 20 06/15/2023   CREATININE 1.07 06/15/2023   CALCIUM 8.6 (L) 06/15/2023   PROT 8.0 03/30/2023   ALBUMIN 4.7 03/30/2023   AST 15 03/30/2023   ALT 17 03/30/2023   ALKPHOS 106 03/30/2023   BILITOT 0.8 03/30/2023   GFRNONAA >60 06/15/2023    Lab Results  Component Value Date   CEA1 0.8 11/19/2020   CEA <1.00 03/30/2023     Medications: I have reviewed the patient's current medications.   Assessment/Plan: Stage IV adenocarcinoma of the colon -11/15/2020 CT abdomen/pelvis with contrast-long segment masslike mural thickening of the cecum, posterior perforation at the site of the cecal wall thickening with multilocular right retroperitoneal abscess, indeterminate hypodensity in the inferior right lobe of the  liver. -11/19/2020 CEA 0.8 -11/20/2020 MRI of the abdomen and pelvis with and without contrast-3.5 x 2.5 cm heterogeneous enhancing lesion in the inferior peripheral right liver with 2 smaller lesions with similar features. -11/20/2020 ultrasound-guided liver biopsy consistent with adenocarcinoma.  Foundation 1-MSS, tumor mutation burden 8,KRAS wt -11/25/2020 CT abdomen/pelvis with contrast-interval placement of a right lower quadrant percutaneous pigtail drainage catheter with near resolution of the retroperitoneal fluid collection, redemonstrated large fungating mass at the cecal base measuring 11.9 x 9.1 x 8.4 cm with enlarged lymph nodes in the right lower quadrant mesocolon. -11/28/2020 exploratory laparotomy, right colectomy, ileostomy creation, cystoscopy with stent placement             -Surgical pathology showed adenocarcinoma, moderately differentiated, 12 cm, 0/14+ lymph nodes, lymphovascular space invasion present, radial margin involved by invasive adenocarcinoma, appendix with intraluminal             adenocarcinoma, pT4, PN 0 -12/09/2020 CT abdomen/pelvis with contrast-interval right colectomy and right lower quadrant ileostomy, stable right retroperitoneal soft tissue density along the psoas and iliacus muscles, mild progression of liver metastases since prior study. -Cycle 1 FOLFOX 12/24/2020 -Cycle 2 FOLFOX 01/06/2021 -Cycle 3 FOLFOX 01/20/2021 -Cycle 4 FOLFOX 02/03/2021 -Cycle 5 FOLFOX 02/17/2021 -Cycle 6 FOLFOX 03/03/2021 -Restaging CTs 03/05/2021- substantial interval decrease in size of predominantly hypodense lesions throughout the liver.  Significant interval resolution of previously seen extensive, heterogeneous soft tissue in the right retroperitoneal soft tissues and paracolic gutter overlying  the psoas and iliacus muscles. -Cycle 7 FOLFOX 03/19/2021 -Cycle 8 FOLFOX 04/03/2021, oxaliplatin dose reduced secondary to neutropenia and thrombocytopenia -Cycle 9 FOLFOX 04/15/2021 -Cycle 10 FOLFOX  04/29/2021 -Cycle 11 FOLFOX 05/13/2021, oxaliplatin held secondary to neuropathy -Cycle 12 FOLFOX 05/27/2021, oxaliplatin held secondary to neuropathy -CT abdomen/pelvis 06/09/2021-decrease size of remaining dominant liver lesion, other liver lesions are calcified and punctate, subtle density in the posterior right bladder, unchanged sclerotic right iliac lesion -Cycle 13 FOLFOX 06/10/2021, oxaliplatin held secondary to neuropathy -Maintenance Xeloda 06/25/2021 -MRI liver 07/03/2021-no significant change in subtle irregular nonenhancing remnant liver lesions of the inferior right lobe of the liver measuring 1.2 x 0.6 cm and adjacent to the gallbladder fossa measuring no greater than 0.5 cm. -Maintenance Xeloda continued -Tumor conference 08/06/2021-rescan at a 20-month interval -Xeloda decreased to 1000 mg a.m., 500 mg p.m. 08/05/2021 (due to hand-foot syndrome) -MRI liver 10/31/2021-treated liver lesion within the inferior right hepatic lobe measuring 0.9 cm.  Small treated lesion within the posterior medial right hepatic lobe measuring 5 mm, unchanged. -Maintenance Xeloda continued -Xeloda discontinued 02/20/2022 -03/13/2022 hepatic wedge resection x2 and ileostomy reversal (nodule left liver bile duct hamartoma; nodule left liver #2 bile duct hamartoma; segment 5 liver mass benign fibrotic and calcified nodule, background liver parenchyma with features of nodular regenerative hyperplasia, no evidence of malignancy; segment 4 liver mass small incidental bile duct hamartoma, background liver parenchyma with features of nodular regenerative hyperplasia, no evidence of malignancy) -CTs 11/27/2022-no evidence of recurrent disease, stable tiny bilateral lung nodules, heterogenous enhancement of the prostate gland -CTs 06/15/2023-no evidence of recurrent disease   2.  Sepsis secondary to perforated colon 3.  Retroperitoneal abscess 4.  Protein calorie malnutrition 5.  Thrombocytosis 6.  Iron deficiency anemia 7.   COVID-19 infection, 11/15/2020 8.  Tobacco abuse 9.  Family history of colon cancer 10.  Port-A-Cath placement, Dr. 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  Admission 09/22/2021 with dehydration and probable COPD flare 13.  Enterococcal bacteremia 03/27/2022, completing outpatient Augmentin, followed by ID      Disposition: Mr. Wick is in clinical remission from colon cancer.  He will return for an office visit, CEA, and surveillance CTs in 4 months.  We will refer him to Dr. Freida Busman for Port-A-Cath removal.  We will refer him to Dr. Adela Lank for a colonoscopy. We will recommend he see his primary provider for management of hypertension. Thornton Papas, MD  08/26/2023  11:47 AM

## 2023-08-26 NOTE — Progress Notes (Signed)
Made a referral to social worker due to patient being depressed. Will make patient aware that Nadiyah the social worker will be reaching out to him by phone.   [facility] to remind that patient is ready for port removal.

## 2023-08-26 NOTE — Telephone Encounter (Signed)
Referral placed for Colonoscopy- Dr. Adela Lank

## 2023-08-26 NOTE — Progress Notes (Signed)
[facility] Clinical Social Work  Clinical Social Work was referred by nurse for assessment of psychosocial needs.  Clinical Social Worker contacted patient by phone to offer support and assess for needs. Patient expressed newly developed feelings of depressive symptoms (increased feelings of sadness) over the past month. Patient attributed new feelings due to losing his car and feeling stuck in his home. Patient denied any SI / HI , reported that his cat is a protective factor and keeps him from having those thoughts. Patient expressed need for increased peer interaction. Follow up scheduled for one week.   Marguerita Merles, LCSWA  Clinical Social Worker [facility]

## 2023-08-27 ENCOUNTER — Other Ambulatory Visit (HOSPITAL_BASED_OUTPATIENT_CLINIC_OR_DEPARTMENT_OTHER): Payer: Self-pay

## 2023-08-27 ENCOUNTER — Other Ambulatory Visit: Payer: Self-pay

## 2023-08-30 ENCOUNTER — Other Ambulatory Visit: Payer: Self-pay

## 2023-08-31 ENCOUNTER — Telehealth: Payer: Self-pay

## 2023-08-31 NOTE — Telephone Encounter (Signed)
[facility] CSW Progress Note  Clinical Social Worker  attempted to reach patient by phone as scheduled  to follow up on expressed pyschosocial needs. No answer, left vm with direct contact information and suggestion to reach out the [employer] for additional ideas of extra support.   Marguerita Merles, LCSW Clinical Social Worker [facility]

## 2023-11-04 ENCOUNTER — Telehealth: Payer: Self-pay

## 2023-11-04 NOTE — Telephone Encounter (Signed)
-----   Message from Benancio Deeds sent at 11/04/2023  8:02 AM EST ----- Looks like since I last saw him he had takedown of his ileostomy and completed therapy for his colon cancer. If he is otherwise feeling well and no new changes to his health, I think okay to proceed with direct colonoscopy at the [facility]. Thanks ----- Message ----- From: Marisa Sprinkles, RN Sent: 11/03/2023   3:39 PM EST To: Benancio Deeds, MD  Dr. Adela Lank, please see note below. Do you need to see patient in the office or can he be scheduled direct? Last colonoscopy was 01/2022.  Please advise. Thanks, Wynot, RN ----- Message ----- From: Wandalee Ferdinand, RN Sent: 11/03/2023   3:18 PM EST To: Marisa Sprinkles, RN  Dr. Truett Perna placed referral on 08/26/23 for Dr. Adela Lank for colonoscopy. Will you follow up on this to see where things stand? Thank you, Susan,RN

## 2023-11-04 NOTE — Telephone Encounter (Signed)
Left detailed voicemail for patient letting him know that Dr. Adela Lank reviewed his chart and is ok with scheduling a direct colonoscopy if he feels well. I informed patient that he will need PV with nurse in addition to colonoscopy appt. I advised patient to call back to schedule.

## 2023-11-08 NOTE — Telephone Encounter (Signed)
2nd attempt - Lm on vm for patient to return call to schedule colonoscopy.

## 2023-11-11 NOTE — Telephone Encounter (Signed)
Sent mychart letter to patient following 2 failed attempts to reach patient at home.

## 2023-11-12 ENCOUNTER — Encounter: Payer: Self-pay | Admitting: Gastroenterology

## 2023-11-15 NOTE — Telephone Encounter (Signed)
Pt has been scheduled for a telephone PV on 11/30/23 at 3:30 pm, colonoscopy scheduled on 12/16/23 at 2:30 pm.

## 2023-11-30 ENCOUNTER — Telehealth: Payer: Self-pay

## 2023-11-30 NOTE — Telephone Encounter (Signed)
No call back from patient to reschedule pre visit so colonoscopy on 12/16/23 has been cancelled.No show letter was sent in My Chart and in the mail.

## 2023-11-30 NOTE — Telephone Encounter (Signed)
Left message that he had a Pre visit appointment today. Will try back in 5 min

## 2023-11-30 NOTE — Telephone Encounter (Signed)
Called home number rang and went busy, then disconnected.  Not able to leave voice mail.

## 2023-11-30 NOTE — Telephone Encounter (Signed)
Left message for patient stating that he had missed his pre visit and needed to call back by end of day to reschedule the pre visit or the colonoscopy on 12/16/23 would be cancelled.

## 2023-12-16 ENCOUNTER — Encounter: Payer: Medicare Other | Admitting: Gastroenterology

## 2023-12-21 ENCOUNTER — Ambulatory Visit (HOSPITAL_BASED_OUTPATIENT_CLINIC_OR_DEPARTMENT_OTHER)
Admission: RE | Admit: 2023-12-21 | Discharge: 2023-12-21 | Disposition: A | Discharge status: Home / Self Care | Payer: Medicare Other | Source: Ambulatory Visit | Attending: Oncology | Admitting: Oncology

## 2023-12-21 ENCOUNTER — Inpatient Hospital Stay: Payer: Self-pay

## 2023-12-21 ENCOUNTER — Inpatient Hospital Stay: Payer: Medicare Other | Attending: Oncology

## 2023-12-21 ENCOUNTER — Other Ambulatory Visit: Payer: Medicare Other

## 2023-12-21 DIAGNOSIS — C787 Secondary malignant neoplasm of liver and intrahepatic bile duct: Secondary | ICD-10-CM

## 2023-12-21 DIAGNOSIS — C189 Malignant neoplasm of colon, unspecified: Secondary | ICD-10-CM | POA: Insufficient documentation

## 2023-12-21 DIAGNOSIS — Z85038 Personal history of other malignant neoplasm of large intestine: Secondary | ICD-10-CM | POA: Insufficient documentation

## 2023-12-21 DIAGNOSIS — R918 Other nonspecific abnormal finding of lung field: Secondary | ICD-10-CM | POA: Insufficient documentation

## 2023-12-21 MED ORDER — HEPARIN SOD (PORK) LOCK FLUSH 100 UNIT/ML IV SOLN
500.0000 [IU] | Freq: Once | INTRAVENOUS | Status: AC
  Administered 2023-12-21: 500 [IU] via INTRAVENOUS

## 2023-12-21 MED ORDER — IOHEXOL 300 MG/ML  SOLN
100.0000 mL | Freq: Once | INTRAMUSCULAR | Status: AC | PRN
  Administered 2023-12-21: 100 mL via INTRAVENOUS

## 2023-12-29 ENCOUNTER — Other Ambulatory Visit: Payer: Medicare Other | Admitting: Oncology

## 2023-12-31 ENCOUNTER — Other Ambulatory Visit: Payer: Self-pay | Admitting: *Deleted

## 2023-12-31 ENCOUNTER — Inpatient Hospital Stay (HOSPITAL_BASED_OUTPATIENT_CLINIC_OR_DEPARTMENT_OTHER): Payer: Medicare Other | Admitting: Oncology

## 2023-12-31 VITALS — BP 160/102 | HR 100 | Temp 98.1°F | Resp 20 | Ht 74.0 in | Wt 180.4 lb

## 2023-12-31 DIAGNOSIS — C787 Secondary malignant neoplasm of liver and intrahepatic bile duct: Secondary | ICD-10-CM

## 2023-12-31 DIAGNOSIS — C189 Malignant neoplasm of colon, unspecified: Secondary | ICD-10-CM | POA: Diagnosis not present

## 2023-12-31 DIAGNOSIS — Z85038 Personal history of other malignant neoplasm of large intestine: Secondary | ICD-10-CM | POA: Diagnosis not present

## 2023-12-31 NOTE — Progress Notes (Signed)
 [facility] OFFICE PROGRESS NOTE   Diagnosis: Colon cancer  INTERVAL HISTORY:   Eric [name] returns as scheduled.  He feels well.  Good appetite.  No difficulty with bowel function.  He reports mild numbness in the fingers that improves with activity.  No other complaint.  He has not undergone a surveillance colonoscopy.  He reports he was unable to schedule the colonoscopy due to lack of a phone.  Objective:  Vital signs in last 24 hours:  Blood pressure (!) 160/102, pulse 100, temperature 98.1 F (36.7 C), temperature source Temporal, resp. rate 20, height 6\' 2"  (1.88 m), weight 180 lb 6.4 oz (81.8 kg), SpO2 100%.    Lymphatics: No cervical, supraclavicular, axillary, or inguinal nodes Resp: Lungs clear bilaterally Cardio: Regular rate and rhythm GI: No hepatosplenomegaly, no mass, nontender Vascular: No leg edema  Skin: Inclusion cyst over the trunk, inflamed cyst at the right mid back  Portacath/PICC-without erythema  Lab Results:  Lab Results  Component Value Date   WBC 8.4 04/12/2023   HGB 13.1 04/12/2023   HCT 39.1 04/12/2023   MCV 84.6 04/12/2023   PLT 267 04/12/2023   NEUTROABS 3.7 02/20/2022    CMP  Lab Results  Component Value Date   NA 136 06/15/2023   K 4.0 06/15/2023   CL 103 06/15/2023   CO2 25 06/15/2023   GLUCOSE 130 (H) 06/15/2023   BUN 20 06/15/2023   CREATININE 1.07 06/15/2023   CALCIUM 8.6 (L) 06/15/2023   PROT 8.0 03/30/2023   ALBUMIN 4.7 03/30/2023   AST 15 03/30/2023   ALT 17 03/30/2023   ALKPHOS 106 03/30/2023   BILITOT 0.8 03/30/2023   GFRNONAA >60 06/15/2023    Lab Results  Component Value Date   CEA1 0.8 11/19/2020   CEA <1.00 03/30/2023     Medications: I have reviewed the patient's current medications.   Assessment/Plan:  Stage IV adenocarcinoma of the colon -11/15/2020 CT abdomen/pelvis with contrast-long segment masslike mural thickening of the cecum, posterior perforation at the site of the cecal wall  thickening with multilocular right retroperitoneal abscess, indeterminate hypodensity in the inferior right lobe of the liver. -11/19/2020 CEA 0.8 -11/20/2020 MRI of the abdomen and pelvis with and without contrast-3.5 x 2.5 cm heterogeneous enhancing lesion in the inferior peripheral right liver with 2 smaller lesions with similar features. -11/20/2020 ultrasound-guided liver biopsy consistent with adenocarcinoma.  Foundation 1-MSS, tumor mutation burden 8,KRAS wt -11/25/2020 CT abdomen/pelvis with contrast-interval placement of a right lower quadrant percutaneous pigtail drainage catheter with near resolution of the retroperitoneal fluid collection, redemonstrated large fungating mass at the cecal base measuring 11.9 x 9.1 x 8.4 cm with enlarged lymph nodes in the right lower quadrant mesocolon. -11/28/2020 exploratory laparotomy, right colectomy, ileostomy creation, cystoscopy with stent placement             -Surgical pathology showed adenocarcinoma, moderately differentiated, 12 cm, 0/14+ lymph nodes, lymphovascular space invasion present, radial margin involved by invasive adenocarcinoma, appendix with intraluminal             adenocarcinoma, pT4, PN 0 -12/09/2020 CT abdomen/pelvis with contrast-interval right colectomy and right lower quadrant ileostomy, stable right retroperitoneal soft tissue density along the psoas and iliacus muscles, mild progression of liver metastases since prior study. -Cycle 1 FOLFOX 12/24/2020 -Cycle 2 FOLFOX 01/06/2021 -Cycle 3 FOLFOX 01/20/2021 -Cycle 4 FOLFOX 02/03/2021 -Cycle 5 FOLFOX 02/17/2021 -Cycle 6 FOLFOX 03/03/2021 -Restaging CTs 03/05/2021- substantial interval decrease in size of predominantly hypodense lesions throughout the liver.  Significant interval resolution of previously seen extensive, heterogeneous soft tissue in the right retroperitoneal soft tissues and paracolic gutter overlying the psoas and iliacus muscles. -Cycle 7 FOLFOX 03/19/2021 -Cycle 8 FOLFOX  04/03/2021, oxaliplatin dose reduced secondary to neutropenia and thrombocytopenia -Cycle 9 FOLFOX 04/15/2021 -Cycle 10 FOLFOX 04/29/2021 -Cycle 11 FOLFOX 05/13/2021, oxaliplatin held secondary to neuropathy -Cycle 12 FOLFOX 05/27/2021, oxaliplatin held secondary to neuropathy -CT abdomen/pelvis 06/09/2021-decrease size of remaining dominant liver lesion, other liver lesions are calcified and punctate, subtle density in the posterior right bladder, unchanged sclerotic right iliac lesion -Cycle 13 FOLFOX 06/10/2021, oxaliplatin held secondary to neuropathy -Maintenance Xeloda 06/25/2021 -MRI liver 07/03/2021-no significant change in subtle irregular nonenhancing remnant liver lesions of the inferior right lobe of the liver measuring 1.2 x 0.6 cm and adjacent to the gallbladder fossa measuring no greater than 0.5 cm. -Maintenance Xeloda continued -Tumor conference 08/06/2021-rescan at a 61-month interval -Xeloda decreased to 1000 mg a.m., 500 mg p.m. 08/05/2021 (due to hand-foot syndrome) -MRI liver 10/31/2021-treated liver lesion within the inferior right hepatic lobe measuring 0.9 cm.  Small treated lesion within the posterior medial right hepatic lobe measuring 5 mm, unchanged. -Maintenance Xeloda continued -Xeloda discontinued 02/20/2022 -03/13/2022 hepatic wedge resection x2 and ileostomy reversal (nodule left liver bile duct hamartoma; nodule left liver #2 bile duct hamartoma; segment 5 liver mass benign fibrotic and calcified nodule, background liver parenchyma with features of nodular regenerative hyperplasia, no evidence of malignancy; segment 4 liver mass small incidental bile duct hamartoma, background liver parenchyma with features of nodular regenerative hyperplasia, no evidence of malignancy) -CTs 11/27/2022-no evidence of recurrent disease, stable tiny bilateral lung nodules, heterogenous enhancement of the prostate gland -CTs 06/15/2023-no evidence of recurrent disease -CTs 12/21/2023: No evidence of  recurrent disease   2.  Sepsis secondary to perforated colon 3.  Retroperitoneal abscess 4.  Protein calorie malnutrition 5.  Thrombocytosis 6.  Iron deficiency anemia 7.  COVID-19 infection, 11/15/2020 8.  Tobacco abuse 9.  Family history of colon cancer 10.  Port-A-Cath placement, Dr. 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  Admission 09/22/2021 with dehydration and probable COPD flare 13.  Enterococcal bacteremia 03/27/2022, completing outpatient Augmentin, followed by ID      Disposition: Mr. Mallek is in clinical remission from colon cancer.  A Port-A-Cath remains in place.  He would like to keep the Port-A-Cath in place for now.  He will turn for a Port-A-Cath flush and CEA in 6 weeks.  He will be scheduled for an office visit in 12 weeks.  We will refer him to Dr. Adela Lank for a surveillance colonoscopy. I encouraged him to follow-up with his primary provider for management of hypertension. Thornton Papas, MD  12/31/2023  10:08 AM

## 2024-02-01 ENCOUNTER — Inpatient Hospital Stay

## 2024-03-08 ENCOUNTER — Encounter: Payer: Self-pay | Admitting: *Deleted

## 2024-03-08 NOTE — Progress Notes (Unsigned)
 Spoke w/[facility] to f/u on referral for surveillance colonoscopy. Scheduler sees the referral and will call his sister to schedule.

## 2024-03-12 ENCOUNTER — Emergency Department (HOSPITAL_COMMUNITY)

## 2024-03-12 ENCOUNTER — Emergency Department (HOSPITAL_COMMUNITY): Admission: EM | Admit: 2024-03-12 | Discharge: 2024-03-12 | Disposition: A | Discharge status: Home / Self Care

## 2024-03-12 ENCOUNTER — Other Ambulatory Visit: Payer: Self-pay

## 2024-03-12 DIAGNOSIS — R55 Syncope and collapse: Secondary | ICD-10-CM | POA: Insufficient documentation

## 2024-03-12 DIAGNOSIS — Z85038 Personal history of other malignant neoplasm of large intestine: Secondary | ICD-10-CM | POA: Diagnosis not present

## 2024-03-12 LAB — CBC
HCT: 39.3 % (ref 39.0–52.0)
Hemoglobin: 13.1 g/dL (ref 13.0–17.0)
MCH: 28.7 pg (ref 26.0–34.0)
MCHC: 33.3 g/dL (ref 30.0–36.0)
MCV: 86 fL (ref 80.0–100.0)
Platelets: 309 10*3/uL (ref 150–400)
RBC: 4.57 MIL/uL (ref 4.22–5.81)
RDW: 14.6 % (ref 11.5–15.5)
WBC: 10 10*3/uL (ref 4.0–10.5)
nRBC: 0 % (ref 0.0–0.2)

## 2024-03-12 LAB — CBG MONITORING, ED: Glucose-Capillary: 148 mg/dL — ABNORMAL HIGH (ref 70–99)

## 2024-03-12 LAB — BASIC METABOLIC PANEL WITH GFR
Anion gap: 8 (ref 5–15)
BUN: 16 mg/dL (ref 6–20)
CO2: 23 mmol/L (ref 22–32)
Calcium: 8.8 mg/dL — ABNORMAL LOW (ref 8.9–10.3)
Chloride: 107 mmol/L (ref 98–111)
Creatinine, Ser: 1.13 mg/dL (ref 0.61–1.24)
GFR, Estimated: >60 mL/min (ref >60)
Glucose, Bld: 112 mg/dL — ABNORMAL HIGH (ref 70–99)
Potassium: 4.2 mmol/L (ref 3.5–5.1)
Sodium: 138 mmol/L (ref 135–145)

## 2024-03-12 LAB — TROPONIN I (HIGH SENSITIVITY): Troponin I (High Sensitivity): 3 ng/L (ref <18)

## 2024-03-12 MED ORDER — LACTATED RINGERS IV BOLUS
1000.0000 mL | Freq: Once | INTRAVENOUS | Status: AC
  Administered 2024-03-12: 1000 mL via INTRAVENOUS

## 2024-03-12 MED ORDER — KETOROLAC TROMETHAMINE 15 MG/ML IJ SOLN
15.0000 mg | Freq: Once | INTRAMUSCULAR | Status: AC
  Administered 2024-03-12: 15 mg via INTRAVENOUS
  Filled 2024-03-12: qty 1

## 2024-03-12 NOTE — ED Provider Notes (Signed)
 [name] EMERGENCY DEPARTMENT AT [facility] Provider Note   CSN: [id number] Arrival date & time: 03/12/24  1227     History {Add pertinent medical, surgical, social history, OB history to HPI:1} Chief Complaint  Patient presents with   Loss of Consciousness    Eric [name] is a 53 y.o. male.  53 year old male with past medical history of stage IV colon cancer now in remission for the past 2 years presenting to the emergency department today after a syncopal episode.  The patient states he did not eat or drink anything this morning.  He states that he was standing and started to feel lightheaded and the next thing he remembers is one of his friends helping him get up.  There was no witnessed seizure activity.  He did not bite his tongue.  He states that he has been feeling well over the past few days.  Denies any associated chest pain or shortness of breath.  Denies any history of syncopal episodes in the past.   Loss of Consciousness      Home Medications Prior to Admission medications   Medication Sig Start Date End Date Taking? Authorizing Provider  acetaminophen  (TYLENOL ) 325 MG tablet Take 2 tablets (650 mg total) by mouth every 6 (six) hours as needed for mild pain or fever. Patient not taking: Reported on 12/31/2023 04/05/22   Lujean Sake, MD  albuterol  (VENTOLIN  HFA) [phone] Base) MCG/ACT inhaler Inhale 2 puffs into the lungs every 6 (six) hours as needed for wheezing or shortness of breath. Patient not taking: Reported on 12/31/2023 08/06/22   Quillian Brunt, MD  docusate sodium  (COLACE) 100 MG capsule Take 1 capsule by mouth 2 times daily. Patient not taking: Reported on 12/31/2023 04/05/22   Lujean Sake, MD  fluticasone  furoate-vilanterol (BREO ELLIPTA ) 200-25 MCG/ACT AEPB Inhale 1 puff into the lungs daily. Patient not taking: Reported on 08/28/2022 02/04/22   Quillian Brunt, MD  gabapentin  (NEURONTIN ) 300 MG capsule Take 1 capsule (300 mg total) by  mouth 3 (three) times daily. Patient not taking: Reported on 12/31/2023 11/04/22   Sumner Ends, MD  lidocaine -prilocaine  (EMLA ) cream Apply to port site 1-2 hours prior to stick and cover with plastic wrap to numb site as directed. Patient not taking: Reported on 12/31/2023 05/27/21   Sumner Ends, MD  Multiple Vitamin (MULTIVITAMIN WITH MINERALS) TABS tablet Take 1 tablet by mouth daily. Patient not taking: Reported on 12/31/2023 04/05/22   Lujean Sake, MD  ondansetron  (ZOFRAN ) 8 MG tablet Take 1 tablet (8 mg total) by mouth every 8 (eight) hours as needed for vomiting or nausea. Patient not taking: Reported on 12/31/2023 08/06/22   Sumner Ends, MD  pantoprazole  (PROTONIX ) 40 MG tablet Take 1 tablet by mouth daily. Patient not taking: Reported on 12/31/2023 06/16/22   Sumner Ends, MD  polyethylene glycol (MIRALAX  / GLYCOLAX ) 17 g packet Take 17 g by mouth daily. Patient not taking: Reported on 12/31/2023 04/05/22   Lujean Sake, MD      Allergies    Patient has no known allergies.    Review of Systems   Review of Systems  Cardiovascular:  Positive for syncope.  Neurological:  Positive for syncope.  All other systems reviewed and are negative.   Physical Exam Updated Vital Signs BP 120/89   Pulse 89   Temp 97.8 F (36.6 C) (Oral)   Resp (!) 22   Ht 6\' 1"  (1.854 m)  Wt 81.6 kg   SpO2 98%   BMI 23.75 kg/m  Physical Exam Vitals and nursing note reviewed.   Gen: NAD Eyes: PERRL, EOMI HEENT: no oropharyngeal swelling Neck: trachea midline Resp: clear to auscultation bilaterally Card: RRR, no murmurs, rubs, or gallops Abd: nontender, nondistended Extremities: no calf tenderness, no edema Vascular: 2+ radial pulses bilaterally, 2+ DP pulses bilaterally Skin: no rashes Psyc: acting appropriately   ED Results / Procedures / Treatments   Labs (all labs ordered are listed, but only abnormal results are displayed) Labs Reviewed  CBG MONITORING, ED -  Abnormal; Notable for the following components:      Result Value   Glucose-Capillary 148 (*)    All other components within normal limits  CBC  BASIC METABOLIC PANEL WITH GFR  TROPONIN I (HIGH SENSITIVITY)    EKG None  Radiology No results found.  Procedures Procedures  {Document cardiac monitor, telemetry assessment procedure when appropriate:1}  Medications Ordered in ED Medications - No data to display  ED Course/ Medical Decision Making/ A&P   {   Click here for ABCD2, HEART and other calculatorsREFRESH Note before signing :1}                              Medical Decision Making 53 year old male with past medical history of stage IV colon cancer in remission presenting to the emergency department today after a syncopal episode.  I will further evaluate the patient here with basic labs well as an EKG, chest x-ray, and troponin to evaluate for ACS, pulmonary edema, pulmonary infiltrates, pneumothorax.  Will keep him on the cardiac monitor here to evaluate for arrhythmias.  The patient's heart rate is 89 and pulse ox 98%.  Suspicion for pulmonary embolism is low at this time.  Will give him IV fluids here.  I will reevaluate for ultimate disposition.  Amount and/or Complexity of Data Reviewed Labs: ordered. Radiology: ordered.   ***  {Document critical care time when appropriate:1} {Document review of labs and clinical decision tools ie heart score, Chads2Vasc2 etc:1}  {Document your independent review of radiology images, and any outside records:1} {Document your discussion with family members, caretakers, and with consultants:1} {Document social determinants of health affecting pt's care:1} {Document your decision making why or why not admission, treatments were needed:1} Final Clinical Impression(s) / ED Diagnoses Final diagnoses:  None    Rx / DC Orders ED Discharge Orders     None

## 2024-03-12 NOTE — ED Notes (Signed)
 Patient discharged by RN, ambulatory to lobby at time of discharge

## 2024-03-12 NOTE — ED Notes (Signed)
 Patient ambulated in hallway, gait even and unassisted. Reports "fuzzy" feeling in his head that made him uncomfortable at times. Reports similar feeling before syncopal episode. Continues to endorse right flank pain.

## 2024-03-12 NOTE — ED Notes (Signed)
 XR at bedside

## 2024-03-12 NOTE — Discharge Instructions (Addendum)
 Your workup today was reassuring.  I have placed consult to our cardiology team.  You should receive a call over the next few days regarding follow-up.  Return to the ER for worsening symptoms.

## 2024-03-12 NOTE — ED Notes (Signed)
 Patient transported to CT

## 2024-03-12 NOTE — ED Triage Notes (Signed)
 Patient arrives via [facility] for syncope. Patient was out with neighbor doing yrd work, no PO intake but coffee and mountain dew. Patient remembers closing his eyes and waking up on ground. Hx of cancer in remission, no syncope. Diaphoretic and tachy 125. Hypotensive 80/60. 500cc NS en route. BP now 118/80, HR 90. CBG 92. Hx of COPD, O2 93% on room air. Patient with small abrasion to right rib cage, no thinners. Alert and oriented x4

## 2024-03-12 NOTE — ED Notes (Signed)
 Sister Avanell Bob updated patient's current condition

## 2024-03-21 ENCOUNTER — Inpatient Hospital Stay: Attending: Oncology

## 2024-03-21 ENCOUNTER — Inpatient Hospital Stay (HOSPITAL_BASED_OUTPATIENT_CLINIC_OR_DEPARTMENT_OTHER): Admitting: Oncology

## 2024-03-21 ENCOUNTER — Encounter: Payer: Self-pay | Admitting: Oncology

## 2024-03-21 ENCOUNTER — Other Ambulatory Visit: Payer: Self-pay | Admitting: *Deleted

## 2024-03-21 ENCOUNTER — Telehealth: Payer: Self-pay | Admitting: Oncology

## 2024-03-21 ENCOUNTER — Inpatient Hospital Stay

## 2024-03-21 VITALS — BP 152/101 | HR 88 | Temp 97.8°F | Resp 18 | Ht 73.0 in | Wt 178.5 lb

## 2024-03-21 DIAGNOSIS — E46 Unspecified protein-calorie malnutrition: Secondary | ICD-10-CM | POA: Diagnosis not present

## 2024-03-21 DIAGNOSIS — Z85038 Personal history of other malignant neoplasm of large intestine: Secondary | ICD-10-CM | POA: Insufficient documentation

## 2024-03-21 DIAGNOSIS — D509 Iron deficiency anemia, unspecified: Secondary | ICD-10-CM | POA: Insufficient documentation

## 2024-03-21 DIAGNOSIS — Z8616 Personal history of COVID-19: Secondary | ICD-10-CM | POA: Diagnosis not present

## 2024-03-21 DIAGNOSIS — C787 Secondary malignant neoplasm of liver and intrahepatic bile duct: Secondary | ICD-10-CM

## 2024-03-21 DIAGNOSIS — C189 Malignant neoplasm of colon, unspecified: Secondary | ICD-10-CM

## 2024-03-21 DIAGNOSIS — D75839 Thrombocytosis, unspecified: Secondary | ICD-10-CM | POA: Diagnosis not present

## 2024-03-21 DIAGNOSIS — I10 Essential (primary) hypertension: Secondary | ICD-10-CM | POA: Diagnosis not present

## 2024-03-21 DIAGNOSIS — K6819 Other retroperitoneal abscess: Secondary | ICD-10-CM | POA: Diagnosis not present

## 2024-03-21 DIAGNOSIS — Z800 Family history of malignant neoplasm of digestive organs: Secondary | ICD-10-CM | POA: Diagnosis not present

## 2024-03-21 DIAGNOSIS — R918 Other nonspecific abnormal finding of lung field: Secondary | ICD-10-CM | POA: Diagnosis present

## 2024-03-21 LAB — BASIC METABOLIC PANEL - CANCER CENTER ONLY
Anion gap: 16 — ABNORMAL HIGH (ref 5–15)
BUN: 15 mg/dL (ref 6–20)
CO2: 20 mmol/L — ABNORMAL LOW (ref 22–32)
Calcium: 9.3 mg/dL (ref 8.9–10.3)
Chloride: 101 mmol/L (ref 98–111)
Creatinine: 1.01 mg/dL (ref 0.61–1.24)
GFR, Estimated: >60 mL/min (ref >60)
Glucose, Bld: 102 mg/dL — ABNORMAL HIGH (ref 70–99)
Potassium: 3.9 mmol/L (ref 3.5–5.1)
Sodium: 137 mmol/L (ref 135–145)

## 2024-03-21 LAB — CEA (ACCESS): CEA (CHCC): <1 ng/mL (ref 0.00–5.00)

## 2024-03-21 NOTE — Telephone Encounter (Signed)
 6 wk port flush appt scheduled..  Additional appts pending.      Follow-Up Information  Follow-up disposition: Return for Port/Labs, office, Scan.  Check out comments: Portacath flush 6 weeks Portacath access/labs then CTs same day 12 weeks  Office 2-3 days after CTs

## 2024-03-21 NOTE — Progress Notes (Signed)
 [facility] OFFICE PROGRESS NOTE   Diagnosis: Colon cancer  INTERVAL HISTORY:   Eric [name] returns as scheduled.  He generally feels well.  Minimal discomfort in the right leg.  He has intermittent tingling in the extremities.  He had a syncope event last week when outside.  He was seen in the emergency room.  The emergency room evaluation revealed no acute findings.  He reports being started on blood pressure medication by primary provider.  Objective:  Vital signs in last 24 hours:  Blood pressure (!) 152/101, pulse 88, temperature 97.8 F (36.6 C), temperature source Temporal, resp. rate 18, height 6\' 1"  (1.854 m), weight 178 lb 8 oz (81 kg), SpO2 98%.     Lymphatics: No cervical, supraclavicular, axillary, or inguinal nodes Resp: Lungs clear bilaterally Cardio: Regular rate and rhythm GI: No hepatosplenomegaly, no mass, nontender Vascular: No leg edema   Portacath/PICC-without erythema  Lab Results:  Lab Results  Component Value Date   WBC 10.0 03/12/2024   HGB 13.1 03/12/2024   HCT 39.3 03/12/2024   MCV 86.0 03/12/2024   PLT 309 03/12/2024   NEUTROABS 3.7 02/20/2022    CMP  Lab Results  Component Value Date   NA 138 03/12/2024   K 4.2 03/12/2024   CL 107 03/12/2024   CO2 23 03/12/2024   GLUCOSE 112 (H) 03/12/2024   BUN 16 03/12/2024   CREATININE 1.13 03/12/2024   CALCIUM  8.8 (L) 03/12/2024   PROT 8.0 03/30/2023   ALBUMIN  4.7 03/30/2023   AST 15 03/30/2023   ALT 17 03/30/2023   ALKPHOS 106 03/30/2023   BILITOT 0.8 03/30/2023   GFRNONAA >60 03/12/2024    Lab Results  Component Value Date   CEA1 0.8 11/19/2020   CEA <1.00 03/30/2023     Medications: I have reviewed the patient's current medications.   Assessment/Plan:  Stage IV adenocarcinoma of the colon -11/15/2020 CT abdomen/pelvis with contrast-long segment masslike mural thickening of the cecum, posterior perforation at the site of the cecal wall thickening with multilocular  right retroperitoneal abscess, indeterminate hypodensity in the inferior right lobe of the liver. -11/19/2020 CEA 0.8 -11/20/2020 MRI of the abdomen and pelvis with and without contrast-3.5 x 2.5 cm heterogeneous enhancing lesion in the inferior peripheral right liver with 2 smaller lesions with similar features. -11/20/2020 ultrasound-guided liver biopsy consistent with adenocarcinoma.  Foundation 1-MSS, tumor mutation burden 8,KRAS wt -11/25/2020 CT abdomen/pelvis with contrast-interval placement of a right lower quadrant percutaneous pigtail drainage catheter with near resolution of the retroperitoneal fluid collection, redemonstrated large fungating mass at the cecal base measuring 11.9 x 9.1 x 8.4 cm with enlarged lymph nodes in the right lower quadrant mesocolon. -11/28/2020 exploratory laparotomy, right colectomy, ileostomy creation, cystoscopy with stent placement             -Surgical pathology showed adenocarcinoma, moderately differentiated, 12 cm, 0/14+ lymph nodes, lymphovascular space invasion present, radial margin involved by invasive adenocarcinoma, appendix with intraluminal             adenocarcinoma, pT4, PN 0 -12/09/2020 CT abdomen/pelvis with contrast-interval right colectomy and right lower quadrant ileostomy, stable right retroperitoneal soft tissue density along the psoas and iliacus muscles, mild progression of liver metastases since prior study. -Cycle 1 FOLFOX 12/24/2020 -Cycle 2 FOLFOX 01/06/2021 -Cycle 3 FOLFOX 01/20/2021 -Cycle 4 FOLFOX 02/03/2021 -Cycle 5 FOLFOX 02/17/2021 -Cycle 6 FOLFOX 03/03/2021 -Restaging CTs 03/05/2021- substantial interval decrease in size of predominantly hypodense lesions throughout the liver.  Significant interval resolution of previously  seen extensive, heterogeneous soft tissue in the right retroperitoneal soft tissues and paracolic gutter overlying the psoas and iliacus muscles. -Cycle 7 FOLFOX 03/19/2021 -Cycle 8 FOLFOX 04/03/2021, oxaliplatin  dose reduced  secondary to neutropenia and thrombocytopenia -Cycle 9 FOLFOX 04/15/2021 -Cycle 10 FOLFOX 04/29/2021 -Cycle 11 FOLFOX 05/13/2021, oxaliplatin  held secondary to neuropathy -Cycle 12 FOLFOX 05/27/2021, oxaliplatin  held secondary to neuropathy -CT abdomen/pelvis 06/09/2021-decrease size of remaining dominant liver lesion, other liver lesions are calcified and punctate, subtle density in the posterior right bladder, unchanged sclerotic right iliac lesion -Cycle 13 FOLFOX 06/10/2021, oxaliplatin  held secondary to neuropathy -Maintenance Xeloda  06/25/2021 -MRI liver 07/03/2021-no significant change in subtle irregular nonenhancing remnant liver lesions of the inferior right lobe of the liver measuring 1.2 x 0.6 cm and adjacent to the gallbladder fossa measuring no greater than 0.5 cm. -Maintenance Xeloda  continued -Tumor conference 08/06/2021-rescan at a 42-month interval -Xeloda  decreased to 1000 mg a.m., 500 mg p.m. 08/05/2021 (due to hand-foot syndrome) -MRI liver 10/31/2021-treated liver lesion within the inferior right hepatic lobe measuring 0.9 cm.  Small treated lesion within the posterior medial right hepatic lobe measuring 5 mm, unchanged. -Maintenance Xeloda  continued -Xeloda  discontinued 02/20/2022 -03/13/2022 hepatic wedge resection x2 and ileostomy reversal (nodule left liver bile duct hamartoma; nodule left liver #2 bile duct hamartoma; segment 5 liver mass benign fibrotic and calcified nodule, background liver parenchyma with features of nodular regenerative hyperplasia, no evidence of malignancy; segment 4 liver mass small incidental bile duct hamartoma, background liver parenchyma with features of nodular regenerative hyperplasia, no evidence of malignancy) -CTs 11/27/2022-no evidence of recurrent disease, stable tiny bilateral lung nodules, heterogenous enhancement of the prostate gland -CTs 06/15/2023-no evidence of recurrent disease -CTs 12/21/2023: No evidence of recurrent disease   2.  Sepsis  secondary to perforated colon 3.  Retroperitoneal abscess 4.  Protein calorie malnutrition 5.  Thrombocytosis 6.  Iron  deficiency anemia 7.  COVID-19 infection, 11/15/2020 8.  Tobacco abuse 9.  Family history of colon cancer 10.  Port-A-Cath placement, Dr. 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  Admission 09/22/2021 with dehydration and probable COPD flare 13.  Enterococcal bacteremia 03/27/2022, completing outpatient Augmentin , followed by ID       Disposition: Eric [name] is in clinical remission from colon cancer.  He would like to keep the Port-A-Cath in place.  He will return for a Port-A-Cath flush in 6 weeks.  He will be scheduled for an office visit and restaging CTs in 12 weeks.  We will refer him to Dr. General Kenner for a surveillance colonoscopy.  I encouraged him to follow-up with his primary provider for management of hypertension.  Coni Deep, MD  03/21/2024  10:15 AM

## 2024-05-02 ENCOUNTER — Encounter

## 2024-05-03 ENCOUNTER — Inpatient Hospital Stay: Attending: Oncology

## 2024-05-03 ENCOUNTER — Other Ambulatory Visit

## 2024-05-03 ENCOUNTER — Ambulatory Visit: Admitting: Oncology

## 2024-05-03 ENCOUNTER — Encounter

## 2024-05-03 DIAGNOSIS — Z452 Encounter for adjustment and management of vascular access device: Secondary | ICD-10-CM | POA: Diagnosis present

## 2024-05-03 DIAGNOSIS — Z85038 Personal history of other malignant neoplasm of large intestine: Secondary | ICD-10-CM | POA: Insufficient documentation

## 2024-05-03 NOTE — Patient Instructions (Signed)

## 2024-05-16 ENCOUNTER — Encounter: Payer: Self-pay | Admitting: *Deleted

## 2024-05-16 NOTE — Progress Notes (Signed)
 Sent staff message to Dr. Hassan nurse to f/u on status of surveillance colonoscopy referral sent on 03/21/24.

## 2024-05-30 ENCOUNTER — Encounter: Payer: Self-pay | Admitting: *Deleted

## 2024-05-30 NOTE — Progress Notes (Signed)
 Sent staff message to [facility] GI referral coordinator that 10 years is not appropriate for f/u colonoscopy. Per MD procedure note, he needs full colonoscopy after his ileostomy takedown which was 03/13/2022, thus it is due now. Requested to re-review the referral.

## 2024-06-13 ENCOUNTER — Inpatient Hospital Stay: Attending: Oncology

## 2024-06-13 ENCOUNTER — Ambulatory Visit (HOSPITAL_BASED_OUTPATIENT_CLINIC_OR_DEPARTMENT_OTHER)
Admission: RE | Admit: 2024-06-13 | Discharge: 2024-06-13 | Disposition: A | Discharge status: Home / Self Care | Source: Ambulatory Visit | Attending: Oncology | Admitting: Oncology

## 2024-06-13 ENCOUNTER — Inpatient Hospital Stay

## 2024-06-13 DIAGNOSIS — C787 Secondary malignant neoplasm of liver and intrahepatic bile duct: Secondary | ICD-10-CM | POA: Diagnosis present

## 2024-06-13 DIAGNOSIS — C189 Malignant neoplasm of colon, unspecified: Secondary | ICD-10-CM | POA: Insufficient documentation

## 2024-06-13 DIAGNOSIS — Z85038 Personal history of other malignant neoplasm of large intestine: Secondary | ICD-10-CM | POA: Insufficient documentation

## 2024-06-13 DIAGNOSIS — Z23 Encounter for immunization: Secondary | ICD-10-CM | POA: Diagnosis not present

## 2024-06-13 LAB — BASIC METABOLIC PANEL - CANCER CENTER ONLY
Anion gap: 12 (ref 5–15)
BUN: 14 mg/dL (ref 6–20)
CO2: 22 mmol/L (ref 22–32)
Calcium: 9.6 mg/dL (ref 8.9–10.3)
Chloride: 103 mmol/L (ref 98–111)
Creatinine: 1.12 mg/dL (ref 0.61–1.24)
GFR, Estimated: >60 mL/min (ref >60)
Glucose, Bld: 109 mg/dL — ABNORMAL HIGH (ref 70–99)
Potassium: 4.3 mmol/L (ref 3.5–5.1)
Sodium: 136 mmol/L (ref 135–145)

## 2024-06-13 LAB — CEA (ACCESS): CEA (CHCC): <1 ng/mL (ref 0.00–5.00)

## 2024-06-13 MED ORDER — IOHEXOL 300 MG/ML  SOLN
100.0000 mL | Freq: Once | INTRAMUSCULAR | Status: AC | PRN
  Administered 2024-06-13: 100 mL via INTRAVENOUS

## 2024-06-13 MED ORDER — HEPARIN SOD (PORK) LOCK FLUSH 100 UNIT/ML IV SOLN
500.0000 [IU] | Freq: Once | INTRAVENOUS | Status: AC
  Administered 2024-06-13: 500 [IU] via INTRAVENOUS

## 2024-06-13 NOTE — Patient Instructions (Signed)

## 2024-06-20 ENCOUNTER — Encounter: Payer: Self-pay | Admitting: Gastroenterology

## 2024-06-21 ENCOUNTER — Inpatient Hospital Stay

## 2024-06-21 ENCOUNTER — Inpatient Hospital Stay (HOSPITAL_BASED_OUTPATIENT_CLINIC_OR_DEPARTMENT_OTHER): Admitting: Oncology

## 2024-06-21 VITALS — BP 130/95 | HR 92 | Temp 97.9°F | Resp 18 | Wt 183.4 lb

## 2024-06-21 DIAGNOSIS — C787 Secondary malignant neoplasm of liver and intrahepatic bile duct: Secondary | ICD-10-CM

## 2024-06-21 DIAGNOSIS — Z23 Encounter for immunization: Secondary | ICD-10-CM

## 2024-06-21 DIAGNOSIS — C189 Malignant neoplasm of colon, unspecified: Secondary | ICD-10-CM | POA: Diagnosis not present

## 2024-06-21 MED ORDER — INFLUENZA VIRUS VACC SPLIT PF (FLUZONE) 0.5 ML IM SUSY
0.5000 mL | PREFILLED_SYRINGE | Freq: Once | INTRAMUSCULAR | Status: AC
  Administered 2024-06-21: 0.5 mL via INTRAMUSCULAR
  Filled 2024-06-21: qty 0.5

## 2024-06-21 NOTE — Progress Notes (Signed)
 Eric [name]   Diagnosis: Colon cancer  INTERVAL HISTORY:   Eric [name] returns as scheduled.  He feels well.  Good appetite.  No difficulty with bowel function.  He is scheduled for a colonoscopy next month.  He is taking blood pressure medication.  Objective:  Vital signs in last 24 hours:  Blood pressure (!) 130/95, pulse 92, temperature 97.9 F (36.6 C), temperature source Temporal, resp. rate 18, weight 183 lb 6.4 oz (83.2 kg), SpO2 95%.    Lymphatics: No cervical, supraclavicular, axillary, or inguinal nodes Resp: Clear bilaterally Cardio: Regular rate and rhythm GI: No hepatomegaly, no mass, nontender Vascular: No leg edema  Skin: Multiple inclusion cysts over the trunk  Portacath/PICC-without erythema  Lab Results:  Lab Results  Component Value Date   WBC 10.0 03/12/2024   HGB 13.1 03/12/2024   HCT 39.3 03/12/2024   MCV 86.0 03/12/2024   PLT 309 03/12/2024   NEUTROABS 3.7 02/20/2022    CMP  Lab Results  Component Value Date   NA 136 06/13/2024   K 4.3 06/13/2024   CL 103 06/13/2024   CO2 22 06/13/2024   GLUCOSE 109 (H) 06/13/2024   BUN 14 06/13/2024   CREATININE 1.12 06/13/2024   CALCIUM  9.6 06/13/2024   PROT 8.0 03/30/2023   ALBUMIN  4.7 03/30/2023   AST 15 03/30/2023   ALT 17 03/30/2023   ALKPHOS 106 03/30/2023   BILITOT 0.8 03/30/2023   GFRNONAA >60 06/13/2024    Lab Results  Component Value Date   CEA1 0.8 11/19/2020   CEA <1.00 06/13/2024    Medications: I have reviewed the patient's current medications.   Assessment/Plan:  Stage IV adenocarcinoma of the colon -11/15/2020 CT abdomen/pelvis with contrast-long segment masslike mural thickening of the cecum, posterior perforation at the site of the cecal wall thickening with multilocular right retroperitoneal abscess, indeterminate hypodensity in the inferior right lobe of the liver. -11/19/2020 CEA 0.8 -11/20/2020 MRI of the abdomen and pelvis with and  without contrast-3.5 x 2.5 cm heterogeneous enhancing lesion in the inferior peripheral right liver with 2 smaller lesions with similar features. -11/20/2020 ultrasound-guided liver biopsy consistent with adenocarcinoma.  Foundation 1-MSS, tumor mutation burden 8,KRAS wt -11/25/2020 CT abdomen/pelvis with contrast-interval placement of a right lower quadrant percutaneous pigtail drainage catheter with near resolution of the retroperitoneal fluid collection, redemonstrated large fungating mass at the cecal base measuring 11.9 x 9.1 x 8.4 cm with enlarged lymph nodes in the right lower quadrant mesocolon. -11/28/2020 exploratory laparotomy, right colectomy, ileostomy creation, cystoscopy with stent placement             -Surgical pathology showed adenocarcinoma, moderately differentiated, 12 cm, 0/14+ lymph nodes, lymphovascular space invasion present, radial margin involved by invasive adenocarcinoma, appendix with intraluminal             adenocarcinoma, pT4, PN 0 -12/09/2020 CT abdomen/pelvis with contrast-interval right colectomy and right lower quadrant ileostomy, stable right retroperitoneal soft tissue density along the psoas and iliacus muscles, mild progression of liver metastases since prior study. -Cycle 1 FOLFOX 12/24/2020 -Cycle 2 FOLFOX 01/06/2021 -Cycle 3 FOLFOX 01/20/2021 -Cycle 4 FOLFOX 02/03/2021 -Cycle 5 FOLFOX 02/17/2021 -Cycle 6 FOLFOX 03/03/2021 -Restaging CTs 03/05/2021- substantial interval decrease in size of predominantly hypodense lesions throughout the liver.  Significant interval resolution of previously seen extensive, heterogeneous soft tissue in the right retroperitoneal soft tissues and paracolic gutter overlying the psoas and iliacus muscles. -Cycle 7 FOLFOX 03/19/2021 -Cycle 8 FOLFOX 04/03/2021, oxaliplatin  dose reduced secondary  to neutropenia and thrombocytopenia -Cycle 9 FOLFOX 04/15/2021 -Cycle 10 FOLFOX 04/29/2021 -Cycle 11 FOLFOX 05/13/2021, oxaliplatin  held secondary to  neuropathy -Cycle 12 FOLFOX 05/27/2021, oxaliplatin  held secondary to neuropathy -CT abdomen/pelvis 06/09/2021-decrease size of remaining dominant liver lesion, other liver lesions are calcified and punctate, subtle density in the posterior right bladder, unchanged sclerotic right iliac lesion -Cycle 13 FOLFOX 06/10/2021, oxaliplatin  held secondary to neuropathy -Maintenance Xeloda  06/25/2021 -MRI liver 07/03/2021-no significant change in subtle irregular nonenhancing remnant liver lesions of the inferior right lobe of the liver measuring 1.2 x 0.6 cm and adjacent to the gallbladder fossa measuring no greater than 0.5 cm. -Maintenance Xeloda  continued -Tumor conference 08/06/2021-rescan at a 30-month interval -Xeloda  decreased to 1000 mg a.m., 500 mg p.m. 08/05/2021 (due to hand-foot syndrome) -MRI liver 10/31/2021-treated liver lesion within the inferior right hepatic lobe measuring 0.9 cm.  Small treated lesion within the posterior medial right hepatic lobe measuring 5 mm, unchanged. -Maintenance Xeloda  continued -Xeloda  discontinued 02/20/2022 -03/13/2022 hepatic wedge resection x2 and ileostomy reversal (nodule left liver bile duct hamartoma; nodule left liver #2 bile duct hamartoma; segment 5 liver mass benign fibrotic and calcified nodule, background liver parenchyma with features of nodular regenerative hyperplasia, no evidence of malignancy; segment 4 liver mass small incidental bile duct hamartoma, background liver parenchyma with features of nodular regenerative hyperplasia, no evidence of malignancy) -CTs 11/27/2022-no evidence of recurrent disease, stable tiny bilateral lung nodules, heterogenous enhancement of the prostate gland -CTs 06/15/2023-no evidence of recurrent disease -CTs 12/21/2023: No evidence of recurrent disease -CTs 06/13/2024: New 5 mm posterior right liver lesion, stable lung nodules.  The right liver lesion appears to have been present on the previous 2 CTs by my review   2.   Sepsis secondary to perforated colon 3.  Retroperitoneal abscess 4.  Protein calorie malnutrition 5.  Thrombocytosis 6.  Iron  deficiency anemia 7.  COVID-19 infection, 11/15/2020 8.  Tobacco abuse 9.  Family history of colon cancer 10.  Port-A-Cath placement, Dr. 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  Admission 09/22/2021 with dehydration and probable COPD flare 13.  Enterococcal bacteremia 03/27/2022, completing outpatient Augmentin , followed by ID       Disposition: Eric [name] is in clinical remission from colon cancer.  I reviewed the CT findings and images with him.  The tiny right liver lesion appears to have been present on previous CTs.  I will present his case at the GI tumor conference for review of the images and to discuss the indication for surveillance.  He would like to keep the Port-A-Cath in place.  He will return for a Port-A-Cath flush in 6 weeks.  He will be scheduled for a repeat CT of the liver and an office visit in 3 months.  We will schedule an MRI if recommended by the GI tumor board.  Eric [name] received an influenza vaccine today.  Arley Hof, MD  06/21/2024  10:55 AM

## 2024-07-14 ENCOUNTER — Ambulatory Visit (AMBULATORY_SURGERY_CENTER)

## 2024-07-14 VITALS — Ht 73.0 in | Wt 182.0 lb

## 2024-07-14 DIAGNOSIS — Z85038 Personal history of other malignant neoplasm of large intestine: Secondary | ICD-10-CM

## 2024-07-14 MED ORDER — NA SULFATE-K SULFATE-MG SULF 17.5-3.13-1.6 GM/177ML PO SOLN: 1.0000 | Freq: Once | ORAL | 0 refills | Status: AC

## 2024-07-14 NOTE — Progress Notes (Signed)
 No egg or soy allergy known to patient  No issues known to pt with past sedation with any surgeries or procedures Patient denies ever being told they had issues or difficulty with intubation  No FH of Malignant Hyperthermia Pt is not on diet pills Pt is not on  home 02  Pt is not on blood thinners  Pt denies issues with constipation  No A fib or A flutter Have any cardiac testing pending--10/15 new patient visit with cardiology Pt can ambulate  Pt denies use of chewing tobacco Discussed diabetic I weight loss medication holds Discussed NSAID holds Checked BMI Pt instructed to use Singlecare.com or GoodRx for a price reduction on prep  Patient's chart reviewed by Norleen Schillings CNRA prior to previsit and patient appropriate for the [facility].  Pre visit completed and red dot placed by patient's name on their procedure day (on provider's schedule).

## 2024-07-26 ENCOUNTER — Encounter: Payer: Self-pay | Admitting: Cardiovascular Disease

## 2024-07-26 ENCOUNTER — Ambulatory Visit: Attending: Cardiovascular Disease | Admitting: Cardiovascular Disease

## 2024-07-26 VITALS — BP 115/77 | HR 80 | Ht 73.0 in | Wt 182.8 lb

## 2024-07-26 DIAGNOSIS — Z720 Tobacco use: Secondary | ICD-10-CM | POA: Diagnosis present

## 2024-07-26 DIAGNOSIS — R55 Syncope and collapse: Secondary | ICD-10-CM | POA: Insufficient documentation

## 2024-07-26 DIAGNOSIS — I10 Essential (primary) hypertension: Secondary | ICD-10-CM | POA: Insufficient documentation

## 2024-07-26 NOTE — Progress Notes (Signed)
 07/26/2024 DWAN HEMMELGARN   1970-11-28  [id number]  Primary Physician Barbra Odor, NP Primary Cardiologist: Dorn JINNY Lesches MD FACP, [place], [place], FSCAI  HPI:  Eric [name] is a 53 y.o. thin, frail and chronically ill-appearing single Caucasian male with no children who is currently disabled because of colon and liver cancer.  He was referred by the ER where he presented on 03/12/2024 with an episode of syncope.  His risk factors include treated hypertension.  He has an 80-pack-year history tobacco abuse having quit 3 years ago, currently vapes.  His father did die of a myocardial infarction.  Never had a heart attack or stroke.  Denies chest pain or shortness of breath.  He did have an echo performed 03/31/2022 which was essentially normal with grade 1 diastolic dysfunction.  He had syncopal episode on 03/12/2024.  He had not eaten all day, was helping a friend do something in the yard, looked up with his son and passed out.  He has not had an episode since that time or prior to that time.   Current Meds  Medication Sig   acetaminophen  (TYLENOL ) 325 MG tablet Take 2 tablets (650 mg total) by mouth every 6 (six) hours as needed for mild pain or fever.   FLUoxetine (PROZAC) 20 MG tablet Take 20 mg by mouth daily.   lidocaine -prilocaine  (EMLA ) cream Apply to port site 1-2 hours prior to stick and cover with plastic wrap to numb site as directed.   olmesartan (BENICAR) 40 MG tablet Take 40 mg by mouth daily.   olmesartan (BENICAR) 5 MG tablet Take 20 mg by mouth daily.   polyethylene glycol (MIRALAX  / GLYCOLAX ) 17 g packet Take 17 g by mouth daily.     No Known Allergies  Social History   Socioeconomic History   Marital status: Single    Spouse name: Not on file   Number of children: Not on file   Years of education: Not on file   Highest education level: Not on file  Occupational History   Not on file  Tobacco Use   Smoking status: Some Days    Types: E-cigarettes   Smokeless  tobacco: Never   Tobacco comments:    Pt currently using vape and 1-2 cigarettes every few days (pt states he is cutting back, as of 04/24/22)  Vaping Use   Vaping status: Some Days  Substance and Sexual Activity   Alcohol use: Not Currently   Drug use: Yes    Types: Marijuana    Comment: Uses it daily to help him sleep when he goes to bed   Sexual activity: Not on file  Other Topics Concern   Not on file  Social History Narrative   Not on file   Social Drivers of Health   Financial Resource Strain: Medium Risk (12/19/2020)   Overall Financial Resource Strain (CARDIA)    Difficulty of Paying Living Expenses: Somewhat hard  Food Insecurity: No Food Insecurity (12/19/2020)   Hunger Vital Sign    Worried About Running Out of Food in the Last Year: Never true    Ran Out of Food in the Last Year: Never true  Transportation Needs: No Transportation Needs (12/19/2020)   PRAPARE - Administrator, Civil Service (Medical): No    Lack of Transportation (Non-Medical): No  Physical Activity: Inactive (12/09/2021)   Exercise Vital Sign    Days of Exercise per Week: 0 days    Minutes of Exercise per  Session: 0 min  Stress: No Stress Concern Present (12/09/2021)   [employer] of Occupational Health - Occupational Stress Questionnaire    Feeling of Stress : Only a little  Social Connections: Moderately Isolated (12/09/2021)   Social Connection and Isolation Panel    Frequency of Communication with Friends and Family: More than three times a week    Frequency of Social Gatherings with Friends and Family: Three times a week    Attends Religious Services: Never    Active Member of Clubs or Organizations: Yes    Attends Banker Meetings: Never    Marital Status: Never married  Intimate Partner Violence: Not At Risk (12/09/2021)   Humiliation, Afraid, Rape, and Kick questionnaire    Fear of Current or Ex-Partner: No    Emotionally Abused: No    Physically Abused: No     Sexually Abused: No     Review of Systems: General: negative for chills, fever, night sweats or weight changes.  Cardiovascular: negative for chest pain, dyspnea on exertion, edema, orthopnea, palpitations, paroxysmal nocturnal dyspnea or shortness of breath Dermatological: negative for rash Respiratory: negative for cough or wheezing Urologic: negative for hematuria Abdominal: negative for nausea, vomiting, diarrhea, bright red blood per rectum, melena, or hematemesis Neurologic: negative for visual changes, syncope, or dizziness All other systems reviewed and are otherwise negative except as noted above.    Blood pressure 115/77, pulse 80, height 6' 1 (1.854 m), weight 182 lb 12.8 oz (82.9 kg), SpO2 94%.  General appearance: alert and no distress Neck: no adenopathy, no carotid bruit, no JVD, supple, symmetrical, trachea midline, and thyroid not enlarged, symmetric, no tenderness/mass/nodules Lungs: clear to auscultation bilaterally Heart: regular rate and rhythm, S1, S2 normal, no murmur, click, rub or gallop Extremities: extremities normal, atraumatic, no cyanosis or edema Pulses: 2+ and symmetric Skin: Skin color, texture, turgor normal. No rashes or lesions Neurologic: Grossly normal  EKG not performed today      ASSESSMENT AND PLAN:   Tobacco abuse History of 80 pack years of tobacco abuse having quit 3 years ago.  He still vapes.  Syncope and collapse History of syncope on 03/12/2024.  He had not eaten that day, was helping a friend in the yard, looked up at the sun and felt dizzy and passed out.  His workup was unrevealing.  He has never had an episode prior to or subsequent to that event.  I do not think he needs further workup.  He did have an echocardiogram performed 6/20/223 that revealed normal LV systolic function, grade 1 diastolic dysfunction without valvular abnormalities.  Essential hypertension History of essential hypertension with blood pressure measured  today 115/77.  He is on olmesartan.     Dorn DOROTHA Lesches MD FACP,FACC,FAHA, [facility] 07/26/2024 3:30 PM

## 2024-07-26 NOTE — Assessment & Plan Note (Signed)
 History of essential hypertension with blood pressure measured today 115/77.  He is on olmesartan.

## 2024-07-26 NOTE — Assessment & Plan Note (Signed)
 History of syncope on 03/12/2024.  He had not eaten that day, was helping a friend in the yard, looked up at the sun and felt dizzy and passed out.  His workup was unrevealing.  He has never had an episode prior to or subsequent to that event.  I do not think he needs further workup.  He did have an echocardiogram performed 6/20/223 that revealed normal LV systolic function, grade 1 diastolic dysfunction without valvular abnormalities.

## 2024-07-26 NOTE — Assessment & Plan Note (Signed)
 History of 80 pack years of tobacco abuse having quit 3 years ago.  He still vapes.

## 2024-07-26 NOTE — Patient Instructions (Signed)

## 2024-07-27 ENCOUNTER — Encounter: Payer: Self-pay | Admitting: Gastroenterology

## 2024-07-27 ENCOUNTER — Ambulatory Visit: Admitting: Gastroenterology

## 2024-07-27 VITALS — BP 97/63 | HR 94 | Temp 97.2°F | Resp 17 | Ht 73.0 in | Wt 182.0 lb

## 2024-07-27 DIAGNOSIS — D123 Benign neoplasm of transverse colon: Secondary | ICD-10-CM | POA: Diagnosis not present

## 2024-07-27 DIAGNOSIS — Z85038 Personal history of other malignant neoplasm of large intestine: Secondary | ICD-10-CM

## 2024-07-27 DIAGNOSIS — F17210 Nicotine dependence, cigarettes, uncomplicated: Secondary | ICD-10-CM

## 2024-07-27 DIAGNOSIS — Z1211 Encounter for screening for malignant neoplasm of colon: Secondary | ICD-10-CM | POA: Diagnosis not present

## 2024-07-27 DIAGNOSIS — Z980 Intestinal bypass and anastomosis status: Secondary | ICD-10-CM

## 2024-07-27 DIAGNOSIS — K635 Polyp of colon: Secondary | ICD-10-CM | POA: Diagnosis not present

## 2024-07-27 MED ORDER — SODIUM CHLORIDE 0.9 % IV SOLN: 500.0000 mL | INTRAVENOUS | Status: DC

## 2024-07-27 NOTE — Op Note (Signed)
 [facility] Patient Name: Eric [name] Procedure Date: 07/27/2024 2:20 PM MRN: [id number] Endoscopist: Elspeth P. Leigh , MD, [id number] Age: 53 Referring MD:  Date of Birth: Mar 05, 1971 Gender: Male Account #: [id number] Procedure:                Colonoscopy Indications:              High risk colon cancer surveillance: Personal                            history of colon cancer - stage IV s/p R                            hemicolectomy at diagnosis with ileostomy.                            Completed chemotherapy, s/p ileostomy takedown in                            2023. First colonoscopy Medicines:                Monitored Anesthesia Care Procedure:                Pre-Anesthesia Assessment:                           - Prior to the procedure, a History and Physical                            was performed, and patient medications and                            allergies were reviewed. The patient's tolerance of                            previous anesthesia was also reviewed. The risks                            and benefits of the procedure and the sedation                            options and risks were discussed with the patient.                            All questions were answered, and informed consent                            was obtained. Prior Anticoagulants: The patient has                            taken no anticoagulant or antiplatelet agents. ASA                            Grade Assessment: III - A patient with severe  systemic disease. After reviewing the risks and                            benefits, the patient was deemed in satisfactory                            condition to undergo the procedure.                           After obtaining informed consent, the colonoscope                            was passed under direct vision. Throughout the                            procedure, the patient's blood pressure, pulse, and                             oxygen saturations were monitored continuously. The                            Olympus Scope [id number] was introduced through the                            anus and advanced to the the ileocolonic                            anastomosis. The colonoscopy was performed without                            difficulty. The patient tolerated the procedure                            well. The quality of the bowel preparation was                            adequate. The surgical anastomosis and rectum were                            photographed. Scope In: 2:29:22 PM Scope Out: 2:45:31 PM Scope Withdrawal Time: 0 hours 13 minutes 1 second  Total Procedure Duration: 0 hours 16 minutes 9 seconds  Findings:                 The perianal and digital rectal examinations were                            normal.                           The terminal ileum appeared normal.                           There was evidence of a prior end-to-end  ileo-colonic anastomosis in the proximal transverse                            colon. This was patent and was characterized by                            healthy appearing mucosa.                           A 3 mm polyp was found in the transverse colon. The                            polyp was flat. The polyp was removed with a cold                            snare. Resection and retrieval were complete.                           A large amount of liquid stool was found in the                            entire colon, making visualization difficult.                            Lavage of the colon was performed using copious                            amounts of fluid, resulting in clearance with good                            visualization.                           The exam was otherwise without abnormality. Complications:            No immediate complications. Estimated blood loss:                             Minimal. Estimated Blood Loss:     Estimated blood loss was minimal. Impression:               - The examined portion of the ileum was normal.                           - Patent end-to-end ileo-colonic anastomosis,                            characterized by healthy appearing mucosa.                           - One 3 mm polyp in the transverse colon, removed                            with a cold snare. Resected and retrieved.                           -  Stool in the entire examined colon leading to                            extensive lavage with adequate clearance.                           - The examination was otherwise normal. Recommendation:           - Patient has a contact number available for                            emergencies. The signs and symptoms of potential                            delayed complications were discussed with the                            patient. Return to normal activities tomorrow.                            Written discharge instructions were provided to the                            patient.                           - Resume previous diet.                           - Continue present medications.                           - Await pathology results.                           - Repeat colonoscopy in 3 years for surveillance. [name], MD 07/27/2024 2:51:39 PM This report has been signed electronically.

## 2024-07-27 NOTE — Progress Notes (Signed)
 Called to room to assist during endoscopic procedure.  Patient ID and intended procedure confirmed with present staff. Received instructions for my participation in the procedure from the performing physician.

## 2024-07-27 NOTE — Progress Notes (Signed)
 [facility] History and Physical   Primary Care Physician:  Barbra Odor, NP   Reason for Procedure:   History of colon cancer  Plan:    colonoscopy     HPI: Eric [name] is a 53 y.o. male  here for colonoscopy - history of R sided colon cancer s/p surgery and ileostomy. Could not prep for full colonoscopy after ileostomy, has since had ostomy takedown. Here for first colonoscopy.     Patient denies any bowel symptoms at this time. No family history of colon cancer known. Otherwise feels well without any cardiopulmonary symptoms.   I have discussed risks / benefits of anesthesia and endoscopic procedure with Eric [name] and they wish to proceed with the exams as outlined today.    Past Medical History:  Diagnosis Date   Anemia    Asthma    Cancer (HCC)    Colon/liver   COPD (chronic obstructive pulmonary disease) (HCC)    COVID-19 virus infection 11/15/2020   Dyspnea    GERD (gastroesophageal reflux disease)    Heart murmur    hx of -  no problems   Hypertension     Past Surgical History:  Procedure Laterality Date   CHOLECYSTECTOMY N/A 03/13/2022   Procedure: CHOLECYSTECTOMY;  Surgeon: Dasie Leonor CROME, MD;  Location: [facility] OR;  Service: General;  Laterality: N/A;   CYSTOSCOPY WITH STENT PLACEMENT N/A 11/28/2020   Procedure: CYSTOSCOPY WITH STENT PLACEMENT;  Surgeon: Selma Donnice JONELLE, MD;  Location: [facility] OR;  Service: Urology;  Laterality: N/A;   ILEOSTOMY CLOSURE N/A 03/13/2022   Procedure: OPEN ILEOSTOMY REVERSAL;  Surgeon: Dasie Leonor CROME, MD;  Location: [facility] OR;  Service: General;  Laterality: N/A;   INSERTION OF MESH N/A 03/23/2022   Procedure: INSERTION OF MESH;  Surgeon: Tanda Locus, MD;  Location: [facility] OR;  Service: General;  Laterality: N/A;   IR RADIOLOGIST EVAL & MGMT  07/22/2021   LAPAROTOMY N/A 11/28/2020   Procedure: EXPLORATORY LAPAROTOMY;  Surgeon: Rubin Calamity, MD;  Location: [facility] OR;  Service: General;  Laterality: N/A;  DOW CASE TO START AT 730AM 4  HOUR CASE   LAPAROTOMY N/A 03/23/2022   Procedure: EXPLORATORY LAPAROTOMY;  Surgeon: Tanda Locus, MD;  Location: [facility] OR;  Service: General;  Laterality: N/A;   LIVER ULTRASOUND N/A 03/13/2022   Procedure: INTRAOPERATIVE LIVER ULTRASOUND;  Surgeon: Dasie Leonor CROME, MD;  Location: [facility] OR;  Service: General;  Laterality: N/A;   OPEN PARTIAL HEPATECTOMY  N/A 03/13/2022   Procedure: PARTIAL HEPATECTOMY;  Surgeon: Dasie Leonor CROME, MD;  Location: [facility] OR;  Service: General;  Laterality: N/A;   OSTOMY N/A 11/28/2020   Procedure: ILEOSTOMY CREATION;  Surgeon: Rubin Calamity, MD;  Location: [facility] OR;  Service: General;  Laterality: N/A;   PARTIAL COLECTOMY Right 11/28/2020   Procedure: RIGHT COLECTOMY;  Surgeon: Rubin Calamity, MD;  Location: [facility] OR;  Service: General;  Laterality: Right;   PORTACATH PLACEMENT Right 12/12/2020   Procedure: INSERTION PORT-A-CATH;  Surgeon: Dasie Leonor CROME, MD;  Location: [facility] OR;  Service: General;  Laterality: Right;    Prior to Admission medications   Medication Sig Start Date End Date Taking? Authorizing Provider  acetaminophen  (TYLENOL ) 325 MG tablet Take 2 tablets (650 mg total) by mouth every 6 (six) hours as needed for mild pain or fever. 04/05/22  Yes Dasie Leonor CROME, MD  FLUoxetine (PROZAC) 20 MG tablet Take 20 mg by mouth daily. 02/29/24  Yes [provider]  olmesartan (BENICAR) 40 MG tablet Take  40 mg by mouth daily. 06/30/24  Yes [provider]  albuterol  (VENTOLIN  HFA) 108 (90 Base) MCG/ACT inhaler Inhale 2 puffs into the lungs every 6 (six) hours as needed for wheezing or shortness of breath. Patient not taking: Reported on 07/26/2024 08/06/22   Kassie Acquanetta Bradley, MD  fluticasone  furoate-vilanterol (BREO ELLIPTA ) 200-25 MCG/ACT AEPB Inhale 1 puff into the lungs daily. Patient not taking: Reported on 07/26/2024 02/04/22   Kassie Acquanetta Bradley, MD  gabapentin  (NEURONTIN ) 300 MG capsule Take 1 capsule (300 mg total) by mouth 3 (three) times daily. Patient not  taking: Reported on 07/26/2024 11/04/22   Cloretta Arley NOVAK, MD  lidocaine -prilocaine  (EMLA ) cream Apply to port site 1-2 hours prior to stick and cover with plastic wrap to numb site as directed. 05/27/21   Cloretta Arley NOVAK, MD  ondansetron  (ZOFRAN ) 8 MG tablet Take 1 tablet (8 mg total) by mouth every 8 (eight) hours as needed for vomiting or nausea. Patient not taking: Reported on 07/26/2024 08/06/22   Cloretta Arley NOVAK, MD  pantoprazole  (PROTONIX ) 40 MG tablet Take 1 tablet by mouth daily. Patient not taking: Reported on 07/26/2024 06/16/22   Cloretta Arley NOVAK, MD  polyethylene glycol (MIRALAX  / GLYCOLAX ) 17 g packet Take 17 g by mouth daily. 04/05/22   Dasie Leonor CROME, MD    Current Outpatient Medications  Medication Sig Dispense Refill   acetaminophen  (TYLENOL ) 325 MG tablet Take 2 tablets (650 mg total) by mouth every 6 (six) hours as needed for mild pain or fever.     FLUoxetine (PROZAC) 20 MG tablet Take 20 mg by mouth daily.     olmesartan (BENICAR) 40 MG tablet Take 40 mg by mouth daily.     albuterol  (VENTOLIN  HFA) 108 (90 Base) MCG/ACT inhaler Inhale 2 puffs into the lungs every 6 (six) hours as needed for wheezing or shortness of breath. (Patient not taking: Reported on 07/26/2024) 6.7 g 0   fluticasone  furoate-vilanterol (BREO ELLIPTA ) 200-25 MCG/ACT AEPB Inhale 1 puff into the lungs daily. (Patient not taking: Reported on 07/26/2024) 60 each 5   gabapentin  (NEURONTIN ) 300 MG capsule Take 1 capsule (300 mg total) by mouth 3 (three) times daily. (Patient not taking: Reported on 07/26/2024) 90 capsule 1   lidocaine -prilocaine  (EMLA ) cream Apply to port site 1-2 hours prior to stick and cover with plastic wrap to numb site as directed. 30 g 2   ondansetron  (ZOFRAN ) 8 MG tablet Take 1 tablet (8 mg total) by mouth every 8 (eight) hours as needed for vomiting or nausea. (Patient not taking: Reported on 07/26/2024) 30 tablet 2   pantoprazole  (PROTONIX ) 40 MG tablet Take 1 tablet by mouth daily.  (Patient not taking: Reported on 07/26/2024) 30 tablet 5   polyethylene glycol (MIRALAX  / GLYCOLAX ) 17 g packet Take 17 g by mouth daily. 14 each 0   Current Facility-Administered Medications  Medication Dose Route Frequency Provider Last Rate Last Admin   0.9 %  sodium chloride  infusion  500 mL Intravenous Continuous Jaysa Kise, Elspeth SQUIBB, MD        Allergies as of 07/27/2024   (No Known Allergies)    Family History  Problem Relation Age of Onset   Diabetes Sister    Colon cancer Paternal Grandmother    Esophageal cancer Neg Hx    Stomach cancer Neg Hx    Pancreatic cancer Neg Hx    Colon polyps Neg Hx    Rectal cancer Neg Hx     Social History  Socioeconomic History   Marital status: Single    Spouse name: Not on file   Number of children: Not on file   Years of education: Not on file   Highest education level: Not on file  Occupational History   Not on file  Tobacco Use   Smoking status: Some Days    Types: E-cigarettes   Smokeless tobacco: Never   Tobacco comments:    Pt currently using vape and 1-2 cigarettes every few days (pt states he is cutting back, as of 04/24/22)  Vaping Use   Vaping status: Some Days  Substance and Sexual Activity   Alcohol use: Not Currently   Drug use: Yes    Types: Marijuana    Comment: Uses it daily to help him sleep when he goes to bed   Sexual activity: Not on file  Other Topics Concern   Not on file  Social History Narrative   Not on file   Social Drivers of Health   Financial Resource Strain: Medium Risk (12/19/2020)   Overall Financial Resource Strain (CARDIA)    Difficulty of Paying Living Expenses: Somewhat hard  Food Insecurity: No Food Insecurity (12/19/2020)   Hunger Vital Sign    Worried About Running Out of Food in the Last Year: Never true    Ran Out of Food in the Last Year: Never true  Transportation Needs: No Transportation Needs (12/19/2020)   PRAPARE - Administrator, Civil Service (Medical): No     Lack of Transportation (Non-Medical): No  Physical Activity: Inactive (12/09/2021)   Exercise Vital Sign    Days of Exercise per Week: 0 days    Minutes of Exercise per Session: 0 min  Stress: No Stress Concern Present (12/09/2021)   [employer] of Occupational Health - Occupational Stress Questionnaire    Feeling of Stress : Only a little  Social Connections: Moderately Isolated (12/09/2021)   Social Connection and Isolation Panel    Frequency of Communication with Friends and Family: More than three times a week    Frequency of Social Gatherings with Friends and Family: Three times a week    Attends Religious Services: Never    Active Member of Clubs or Organizations: Yes    Attends Banker Meetings: Never    Marital Status: Never married  Intimate Partner Violence: Not At Risk (12/09/2021)   Humiliation, Afraid, Rape, and Kick questionnaire    Fear of Current or Ex-Partner: No    Emotionally Abused: No    Physically Abused: No    Sexually Abused: No    Review of Systems: All other review of systems negative except as mentioned in the HPI.  Physical Exam: Vital signs BP 107/64   Pulse (!) 108   Temp (!) 97.2 F (36.2 C) (Temporal)   Ht 6' 1 (1.854 m)   Wt 182 lb (82.6 kg)   SpO2 95%   BMI 24.01 kg/m   General:   Alert,  Well-developed, pleasant and cooperative in NAD Lungs:  Clear throughout to auscultation.   Heart:  Regular rate and rhythm Abdomen:  Soft, nontender and nondistended.   Neuro/Psych:  Alert and cooperative. Normal mood and affect. A and O x 3  Marcey Naval, MD [facility]

## 2024-07-27 NOTE — Patient Instructions (Signed)
 Resume previous diet.  Continue present medications. Awaiting pathology results. Repeat colonoscopy in 3 years for surveillance.  Handout provided on polyps.   YOU HAD AN ENDOSCOPIC PROCEDURE TODAY AT THE [facility]:   Refer to the procedure report that was given to you for any specific questions about what was found during the examination.  If the procedure report does not answer your questions, please call your gastroenterologist to clarify.  If you requested that your care partner not be given the details of your procedure findings, then the procedure report has been included in a sealed envelope for you to review at your convenience later.  YOU SHOULD EXPECT: Some feelings of bloating in the abdomen. Passage of more gas than usual.  Walking can help get rid of the air that was put into your GI tract during the procedure and reduce the bloating. If you had a lower endoscopy (such as a colonoscopy or flexible sigmoidoscopy) you may notice spotting of blood in your stool or on the toilet paper. If you underwent a bowel prep for your procedure, you may not have a normal bowel movement for a few days.  Please Note:  You might notice some irritation and congestion in your nose or some drainage.  This is from the oxygen used during your procedure.  There is no need for concern and it should clear up in a day or so.  SYMPTOMS TO REPORT IMMEDIATELY:  Following lower endoscopy (colonoscopy or flexible sigmoidoscopy):  Excessive amounts of blood in the stool  Significant tenderness or worsening of abdominal pains  Swelling of the abdomen that is new, acute  Fever of 100F or higher  For urgent or emergent issues, a gastroenterologist can be reached at any hour by calling [phone]. Do not use MyChart messaging for urgent concerns.    DIET:  We do recommend a small meal at first, but then you may proceed to your regular diet.  Drink plenty of fluids but you should avoid alcoholic  beverages for 24 hours.  ACTIVITY:  You should plan to take it easy for the rest of today and you should NOT DRIVE or use heavy machinery until tomorrow (because of the sedation medicines used during the test).    FOLLOW UP: Our staff will call the number listed on your records the next business day following your procedure.  We will call around 7:15- 8:00 am to check on you and address any questions or concerns that you may have regarding the information given to you following your procedure. If we do not reach you, we will leave a message.     If any biopsies were taken you will be contacted by phone or by letter within the next 1-3 weeks.  Please call us  at [phone] if you have not heard about the biopsies in 3 weeks.    SIGNATURES/CONFIDENTIALITY: You and/or your care partner have signed paperwork which will be entered into your electronic medical record.  These signatures attest to the fact that that the information above on your After Visit Summary has been reviewed and is understood.  Full responsibility of the confidentiality of this discharge information lies with you and/or your care-partner.

## 2024-07-27 NOTE — Progress Notes (Signed)
 Pt's states no medical or surgical changes since previsit or office visit.

## 2024-07-27 NOTE — Progress Notes (Signed)
 Report to PACU, RN, vss, BBS= Clear.

## 2024-07-28 ENCOUNTER — Telehealth: Payer: Self-pay

## 2024-07-28 NOTE — Telephone Encounter (Signed)
  Follow up Call-     07/27/2024    1:32 PM 02/02/2022    8:48 AM  Call back number  Post procedure Call Back phone  # [phone] or [phone]  Permission to leave phone message Yes Yes     Patient questions:  Do you have a fever, pain , or abdominal swelling? No. Pain Score  0 *  Have you tolerated food without any problems? Yes.    Have you been able to return to your normal activities? Yes.    Do you have any questions about your discharge instructions: Diet   No. Medications  No. Follow up visit  No.  Do you have questions or concerns about your Care? No.  Actions: * If pain score is 4 or above: No action needed, pain <4.

## 2024-08-01 ENCOUNTER — Ambulatory Visit: Payer: Self-pay | Admitting: Gastroenterology

## 2024-08-01 LAB — SURGICAL PATHOLOGY

## 2024-08-02 ENCOUNTER — Inpatient Hospital Stay: Attending: Oncology

## 2024-08-02 DIAGNOSIS — Z85038 Personal history of other malignant neoplasm of large intestine: Secondary | ICD-10-CM | POA: Diagnosis present

## 2024-08-02 DIAGNOSIS — Z452 Encounter for adjustment and management of vascular access device: Secondary | ICD-10-CM | POA: Insufficient documentation

## 2024-08-02 NOTE — Patient Instructions (Signed)

## 2024-08-29 ENCOUNTER — Inpatient Hospital Stay

## 2024-08-30 ENCOUNTER — Telehealth: Payer: Self-pay | Admitting: *Deleted

## 2024-08-30 NOTE — Telephone Encounter (Signed)
 Notified Eric [name] of CT at [facility] on 12/4 at 1 pm with arrival at 10:45 for oral contrast. He will have port accessed for lab/scan at 10:15 am same day. Scheduler notified to schedule the port/lab portion.

## 2024-09-11 ENCOUNTER — Telehealth: Payer: Self-pay | Admitting: Oncology

## 2024-09-11 NOTE — Telephone Encounter (Signed)
 PT called with questions about PF appointment.

## 2024-09-13 ENCOUNTER — Encounter (HOSPITAL_BASED_OUTPATIENT_CLINIC_OR_DEPARTMENT_OTHER): Payer: Self-pay

## 2024-09-14 ENCOUNTER — Inpatient Hospital Stay: Attending: Oncology

## 2024-09-14 ENCOUNTER — Ambulatory Visit (HOSPITAL_BASED_OUTPATIENT_CLINIC_OR_DEPARTMENT_OTHER)
Admission: RE | Admit: 2024-09-14 | Discharge: 2024-09-14 | Disposition: A | Discharge status: Home / Self Care | Source: Ambulatory Visit | Attending: Oncology | Admitting: Oncology

## 2024-09-14 VITALS — BP 146/82 | HR 80 | Temp 98.2°F | Resp 18

## 2024-09-14 DIAGNOSIS — Z85038 Personal history of other malignant neoplasm of large intestine: Secondary | ICD-10-CM | POA: Diagnosis present

## 2024-09-14 DIAGNOSIS — E46 Unspecified protein-calorie malnutrition: Secondary | ICD-10-CM | POA: Insufficient documentation

## 2024-09-14 DIAGNOSIS — D509 Iron deficiency anemia, unspecified: Secondary | ICD-10-CM | POA: Diagnosis not present

## 2024-09-14 DIAGNOSIS — Z8616 Personal history of COVID-19: Secondary | ICD-10-CM | POA: Diagnosis not present

## 2024-09-14 DIAGNOSIS — C787 Secondary malignant neoplasm of liver and intrahepatic bile duct: Secondary | ICD-10-CM | POA: Insufficient documentation

## 2024-09-14 DIAGNOSIS — D75839 Thrombocytosis, unspecified: Secondary | ICD-10-CM | POA: Diagnosis not present

## 2024-09-14 DIAGNOSIS — K6819 Other retroperitoneal abscess: Secondary | ICD-10-CM | POA: Insufficient documentation

## 2024-09-14 DIAGNOSIS — C189 Malignant neoplasm of colon, unspecified: Secondary | ICD-10-CM

## 2024-09-14 DIAGNOSIS — G620 Drug-induced polyneuropathy: Secondary | ICD-10-CM | POA: Diagnosis not present

## 2024-09-14 DIAGNOSIS — Z800 Family history of malignant neoplasm of digestive organs: Secondary | ICD-10-CM | POA: Diagnosis not present

## 2024-09-14 DIAGNOSIS — T451X1A Poisoning by antineoplastic and immunosuppressive drugs, accidental (unintentional), initial encounter: Secondary | ICD-10-CM | POA: Insufficient documentation

## 2024-09-14 LAB — CBC WITH DIFFERENTIAL (CANCER CENTER ONLY)
Abs Immature Granulocytes: 0.04 K/uL (ref 0.00–0.07)
Basophils Absolute: 0.1 K/uL (ref 0.0–0.1)
Basophils Relative: 1 %
Eosinophils Absolute: 0.2 K/uL (ref 0.0–0.5)
Eosinophils Relative: 3 %
HCT: 38.8 % — ABNORMAL LOW (ref 39.0–52.0)
Hemoglobin: 12.9 g/dL — ABNORMAL LOW (ref 13.0–17.0)
Immature Granulocytes: 1 %
Lymphocytes Relative: 18 %
Lymphs Abs: 1.3 K/uL (ref 0.7–4.0)
MCH: 28.3 pg (ref 26.0–34.0)
MCHC: 33.2 g/dL (ref 30.0–36.0)
MCV: 85.1 fL (ref 80.0–100.0)
Monocytes Absolute: 0.6 K/uL (ref 0.1–1.0)
Monocytes Relative: 8 %
Neutro Abs: 5 K/uL (ref 1.7–7.7)
Neutrophils Relative %: 69 %
Platelet Count: 267 K/uL (ref 150–400)
RBC: 4.56 MIL/uL (ref 4.22–5.81)
RDW: 14.2 % (ref 11.5–15.5)
WBC Count: 7.2 K/uL (ref 4.0–10.5)
nRBC: 0 % (ref 0.0–0.2)

## 2024-09-14 LAB — CMP (CANCER CENTER ONLY)
ALT: 20 U/L (ref 0–44)
AST: 21 U/L (ref 15–41)
Albumin: 4.2 g/dL (ref 3.5–5.0)
Alkaline Phosphatase: 105 U/L (ref 38–126)
Anion gap: 10 (ref 5–15)
BUN: 12 mg/dL (ref 6–20)
CO2: 25 mmol/L (ref 22–32)
Calcium: 9.4 mg/dL (ref 8.9–10.3)
Chloride: 102 mmol/L (ref 98–111)
Creatinine: 1.08 mg/dL (ref 0.61–1.24)
GFR, Estimated: >60 mL/min (ref >60)
Glucose, Bld: 108 mg/dL — ABNORMAL HIGH (ref 70–99)
Potassium: 4.2 mmol/L (ref 3.5–5.1)
Sodium: 137 mmol/L (ref 135–145)
Total Bilirubin: 0.4 mg/dL (ref 0.0–1.2)
Total Protein: 7.4 g/dL (ref 6.5–8.1)

## 2024-09-14 MED ORDER — IOHEXOL 300 MG/ML  SOLN
100.0000 mL | Freq: Once | INTRAMUSCULAR | Status: AC | PRN
  Administered 2024-09-14: 100 mL via INTRAVENOUS

## 2024-09-14 MED ORDER — HEPARIN SOD (PORK) LOCK FLUSH 100 UNIT/ML IV SOLN
500.0000 [IU] | Freq: Once | INTRAVENOUS | Status: AC
  Administered 2024-09-14: 500 [IU] via INTRAVENOUS

## 2024-09-14 NOTE — Progress Notes (Signed)
 Patients port flushed without difficulty.  Good blood return noted with no bruising or swelling noted at site. Patient remains accessed for CT scan. Patient to be de-accessed in radiology after scan, verbalized understanding.

## 2024-09-14 NOTE — Patient Instructions (Signed)
 [facility] - A DEPT OF [facility]  Discharge Instructions: Thank you for choosing [facility] to provide your oncology and hematology care.   If you have a lab appointment with the Cancer Center, please go directly to the Cancer Center and check in at the registration area.   Wear comfortable clothing and clothing appropriate for easy access to any Portacath or PICC line.   We strive to give you quality time with your provider. You may need to reschedule your appointment if you arrive late (15 or more minutes).  Arriving late affects you and other patients whose appointments are after yours.  Also, if you miss three or more appointments without notifying the office, you may be dismissed from the clinic at the provider's discretion.      For prescription refill requests, have your pharmacy contact our office and allow 72 hours for refills to be completed.    Today you received the following port flush with labs.    To help prevent nausea and vomiting after your treatment, we encourage you to take your nausea medication as directed.  BELOW ARE SYMPTOMS THAT SHOULD BE REPORTED IMMEDIATELY: *FEVER GREATER THAN 100.4 F (38 C) OR HIGHER *CHILLS OR SWEATING *NAUSEA AND VOMITING THAT IS NOT CONTROLLED WITH YOUR NAUSEA MEDICATION *UNUSUAL SHORTNESS OF BREATH *UNUSUAL BRUISING OR BLEEDING *URINARY PROBLEMS (pain or burning when urinating, or frequent urination) *BOWEL PROBLEMS (unusual diarrhea, constipation, pain near the anus) TENDERNESS IN MOUTH AND THROAT WITH OR WITHOUT PRESENCE OF ULCERS (sore throat, sores in mouth, or a toothache) UNUSUAL RASH, SWELLING OR PAIN  UNUSUAL VAGINAL DISCHARGE OR ITCHING   Items with * indicate a potential emergency and should be followed up as soon as possible or go to the Emergency Department if any problems should occur.  Please show the CHEMOTHERAPY ALERT CARD or IMMUNOTHERAPY ALERT CARD at check-in to the  Emergency Department and triage nurse.  Should you have questions after your visit or need to cancel or reschedule your appointment, please contact [facility] - A DEPT OF [facility]  Dept: [phone]  and follow the prompts.  Office hours are 8:00 a.m. to 4:30 p.m. Monday - Friday. Please note that voicemails left after 4:00 p.m. may not be returned until the following business day.  We are closed weekends and major holidays. You have access to a nurse at all times for urgent questions. Please call the main number to the clinic Dept: [phone] and follow the prompts.   For any non-urgent questions, you may also contact your provider using MyChart. We now offer e-Visits for anyone 60 and older to request care online for non-urgent symptoms. For details visit [email or web address].   Also download the MyChart app! Go to the app store, search MyChart, open the app, select [place], and log in with your MyChart username and password.

## 2024-09-21 ENCOUNTER — Inpatient Hospital Stay: Admitting: Oncology

## 2024-09-21 VITALS — BP 126/93 | HR 90 | Temp 97.6°F | Resp 18 | Ht 73.0 in | Wt 182.3 lb

## 2024-09-21 DIAGNOSIS — C787 Secondary malignant neoplasm of liver and intrahepatic bile duct: Secondary | ICD-10-CM | POA: Diagnosis not present

## 2024-09-21 DIAGNOSIS — C189 Malignant neoplasm of colon, unspecified: Secondary | ICD-10-CM | POA: Diagnosis not present

## 2024-09-21 DIAGNOSIS — Z85038 Personal history of other malignant neoplasm of large intestine: Secondary | ICD-10-CM | POA: Diagnosis not present

## 2024-09-21 NOTE — Progress Notes (Signed)
 [facility] OFFICE PROGRESS NOTE   Diagnosis: Colon cancer  INTERVAL HISTORY:   Eric [name] returns as scheduled.  He feels well.  He has minimal neuropathy symptoms in the hands.  No other complaint.  Objective:  Vital signs in last 24 hours:  Blood pressure (!) 126/93, pulse 90, temperature 97.6 F (36.4 C), temperature source Temporal, resp. rate 18, height 6' 1 (1.854 m), weight 182 lb 4.8 oz (82.7 kg), SpO2 97%.    Lymphatics: No cervical, supraclavicular, axillary, or inguinal nodes Resp: Lungs clear bilaterally Cardio: Regular rate and rhythm GI: No mass, nontender, no hepatosplenomegaly Vascular: No leg edema  Skin: Multiple inclusion cysts  Portacath/PICC-without erythema  Lab Results:  Lab Results  Component Value Date   WBC 7.2 09/14/2024   HGB 12.9 (L) 09/14/2024   HCT 38.8 (L) 09/14/2024   MCV 85.1 09/14/2024   PLT 267 09/14/2024   NEUTROABS 5.0 09/14/2024    CMP  Lab Results  Component Value Date   NA 137 09/14/2024   K 4.2 09/14/2024   CL 102 09/14/2024   CO2 25 09/14/2024   GLUCOSE 108 (H) 09/14/2024   BUN 12 09/14/2024   CREATININE 1.08 09/14/2024   CALCIUM  9.4 09/14/2024   PROT 7.4 09/14/2024   ALBUMIN  4.2 09/14/2024   AST 21 09/14/2024   ALT 20 09/14/2024   ALKPHOS 105 09/14/2024   BILITOT 0.4 09/14/2024   GFRNONAA >60 09/14/2024    Lab Results  Component Value Date   CEA1 0.8 11/19/2020   CEA <1.00 06/13/2024    Lab Results  Component Value Date   INR 1.2 11/15/2020   LABPROT 14.5 11/15/2020    Imaging:  No results found.  Medications: I have reviewed the patient's current medications.   Assessment/Plan:  Stage IV adenocarcinoma of the colon -11/15/2020 CT abdomen/pelvis with contrast-long segment masslike mural thickening of the cecum, posterior perforation at the site of the cecal wall thickening with multilocular right retroperitoneal abscess, indeterminate hypodensity in the inferior right lobe of the  liver. -11/19/2020 CEA 0.8 -11/20/2020 MRI of the abdomen and pelvis with and without contrast-3.5 x 2.5 cm heterogeneous enhancing lesion in the inferior peripheral right liver with 2 smaller lesions with similar features. -11/20/2020 ultrasound-guided liver biopsy consistent with adenocarcinoma.  Foundation 1-MSS, tumor mutation burden 8,KRAS wt -11/25/2020 CT abdomen/pelvis with contrast-interval placement of a right lower quadrant percutaneous pigtail drainage catheter with near resolution of the retroperitoneal fluid collection, redemonstrated large fungating mass at the cecal base measuring 11.9 x 9.1 x 8.4 cm with enlarged lymph nodes in the right lower quadrant mesocolon. -11/28/2020 exploratory laparotomy, right colectomy, ileostomy creation, cystoscopy with stent placement             -Surgical pathology showed adenocarcinoma, moderately differentiated, 12 cm, 0/14+ lymph nodes, lymphovascular space invasion present, radial margin involved by invasive adenocarcinoma, appendix with intraluminal             adenocarcinoma, pT4, PN 0 -12/09/2020 CT abdomen/pelvis with contrast-interval right colectomy and right lower quadrant ileostomy, stable right retroperitoneal soft tissue density along the psoas and iliacus muscles, mild progression of liver metastases since prior study. -Cycle 1 FOLFOX 12/24/2020 -Cycle 2 FOLFOX 01/06/2021 -Cycle 3 FOLFOX 01/20/2021 -Cycle 4 FOLFOX 02/03/2021 -Cycle 5 FOLFOX 02/17/2021 -Cycle 6 FOLFOX 03/03/2021 -Restaging CTs 03/05/2021- substantial interval decrease in size of predominantly hypodense lesions throughout the liver.  Significant interval resolution of previously seen extensive, heterogeneous soft tissue in the right retroperitoneal soft tissues and paracolic gutter  overlying the psoas and iliacus muscles. -Cycle 7 FOLFOX 03/19/2021 -Cycle 8 FOLFOX 04/03/2021, oxaliplatin  dose reduced secondary to neutropenia and thrombocytopenia -Cycle 9 FOLFOX 04/15/2021 -Cycle 10 FOLFOX  04/29/2021 -Cycle 11 FOLFOX 05/13/2021, oxaliplatin  held secondary to neuropathy -Cycle 12 FOLFOX 05/27/2021, oxaliplatin  held secondary to neuropathy -CT abdomen/pelvis 06/09/2021-decrease size of remaining dominant liver lesion, other liver lesions are calcified and punctate, subtle density in the posterior right bladder, unchanged sclerotic right iliac lesion -Cycle 13 FOLFOX 06/10/2021, oxaliplatin  held secondary to neuropathy -Maintenance Xeloda  06/25/2021 -MRI liver 07/03/2021-no significant change in subtle irregular nonenhancing remnant liver lesions of the inferior right lobe of the liver measuring 1.2 x 0.6 cm and adjacent to the gallbladder fossa measuring no greater than 0.5 cm. -Maintenance Xeloda  continued -Tumor conference 08/06/2021-rescan at a 69-month interval -Xeloda  decreased to 1000 mg a.m., 500 mg p.m. 08/05/2021 (due to hand-foot syndrome) -MRI liver 10/31/2021-treated liver lesion within the inferior right hepatic lobe measuring 0.9 cm.  Small treated lesion within the posterior medial right hepatic lobe measuring 5 mm, unchanged. -Maintenance Xeloda  continued -Xeloda  discontinued 02/20/2022 -03/13/2022 hepatic wedge resection x2 and ileostomy reversal (nodule left liver bile duct hamartoma; nodule left liver #2 bile duct hamartoma; segment 5 liver mass benign fibrotic and calcified nodule, background liver parenchyma with features of nodular regenerative hyperplasia, no evidence of malignancy; segment 4 liver mass small incidental bile duct hamartoma, background liver parenchyma with features of nodular regenerative hyperplasia, no evidence of malignancy) -CTs 11/27/2022-no evidence of recurrent disease, stable tiny bilateral lung nodules, heterogenous enhancement of the prostate gland -CTs 06/15/2023-no evidence of recurrent disease -CTs 12/21/2023: No evidence of recurrent disease -CTs 06/13/2024: New 5 mm posterior right liver lesion, stable lung nodules.  The right liver lesion appears  to have been present on the previous 2 CTs by my review -07/27/2024 colonoscopy: Transverse colon polyp-sessile serrated polyp without dysplasia -09/14/2024 CT abdomen: New 5 mm lesion of concern on the 06/13/2024 study is subtle with no change in size   2.  Sepsis secondary to perforated colon 3.  Retroperitoneal abscess 4.  Protein calorie malnutrition 5.  Thrombocytosis 6.  Iron  deficiency anemia 7.  COVID-19 infection, 11/15/2020 8.  Tobacco abuse 9.  Family history of colon cancer 10.  Port-A-Cath placement, Dr. 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  Admission 09/22/2021 with dehydration and probable COPD flare 13.  Enterococcal bacteremia 03/27/2022, completing outpatient Augmentin , followed by ID      Disposition: Eric [name] is in clinical remission from colon cancer.  I reviewed the surveillance CT images and findings with him.  The tiny lesion in the right liver noted on the CT 06/13/2024 appears unchanged.  We will plan for surveillance CTs in 6 months.  He will return for an office visit in 3 months.  A Port-A-Cath remains in place.  He will return for a Port-A-Cath flush in 6 weeks.  We will check the CEA when he returns in 3 months.  Arley Hof, MD  09/21/2024  11:40 AM

## 2024-11-02 ENCOUNTER — Inpatient Hospital Stay: Attending: Oncology

## 2024-11-02 DIAGNOSIS — Z452 Encounter for adjustment and management of vascular access device: Secondary | ICD-10-CM | POA: Diagnosis present

## 2024-11-02 DIAGNOSIS — Z85038 Personal history of other malignant neoplasm of large intestine: Secondary | ICD-10-CM | POA: Diagnosis present

## 2024-11-02 NOTE — Patient Instructions (Signed)

## 2024-12-11 ENCOUNTER — Inpatient Hospital Stay

## 2024-12-11 ENCOUNTER — Inpatient Hospital Stay: Admitting: Oncology
